# Patient Record
Sex: Female | Born: 1940
Health system: Southern US, Community
[De-identification: ages and names within clinical notes are randomized; demographics above are authoritative.]

## PROBLEM LIST (undated history)

## (undated) DIAGNOSIS — J8489 Other specified interstitial pulmonary diseases: Secondary | ICD-10-CM

## (undated) DIAGNOSIS — K224 Dyskinesia of esophagus: Secondary | ICD-10-CM

## (undated) DIAGNOSIS — J189 Pneumonia, unspecified organism: Secondary | ICD-10-CM

## (undated) DIAGNOSIS — M8580 Other specified disorders of bone density and structure, unspecified site: Secondary | ICD-10-CM

## (undated) DIAGNOSIS — K3184 Gastroparesis: Secondary | ICD-10-CM

## (undated) DIAGNOSIS — G629 Polyneuropathy, unspecified: Secondary | ICD-10-CM

## (undated) DIAGNOSIS — J8410 Pulmonary fibrosis, unspecified: Secondary | ICD-10-CM

## (undated) DIAGNOSIS — D75839 Thrombocytosis, unspecified: Secondary | ICD-10-CM

## (undated) DIAGNOSIS — I7300 Raynaud's syndrome without gangrene: Secondary | ICD-10-CM

## (undated) DIAGNOSIS — T7840XA Allergy, unspecified, initial encounter: Secondary | ICD-10-CM

## (undated) DIAGNOSIS — J069 Acute upper respiratory infection, unspecified: Secondary | ICD-10-CM

## (undated) DIAGNOSIS — D509 Iron deficiency anemia, unspecified: Secondary | ICD-10-CM

## (undated) DIAGNOSIS — K449 Diaphragmatic hernia without obstruction or gangrene: Secondary | ICD-10-CM

## (undated) DIAGNOSIS — H3530 Unspecified macular degeneration: Secondary | ICD-10-CM

## (undated) DIAGNOSIS — K2210 Ulcer of esophagus without bleeding: Secondary | ICD-10-CM

## (undated) DIAGNOSIS — H269 Unspecified cataract: Secondary | ICD-10-CM

## (undated) DIAGNOSIS — M1990 Unspecified osteoarthritis, unspecified site: Secondary | ICD-10-CM

## (undated) DIAGNOSIS — R519 Headache, unspecified: Secondary | ICD-10-CM

## (undated) DIAGNOSIS — K219 Gastro-esophageal reflux disease without esophagitis: Secondary | ICD-10-CM

## (undated) HISTORY — DX: Pneumonia, unspecified organism: J18.9

## (undated) HISTORY — DX: Dyskinesia of esophagus: K22.4

## (undated) HISTORY — PX: BREAST LUMPECTOMY: SHX2

## (undated) HISTORY — PX: CATARACT EXTRACTION: SUR2

## (undated) HISTORY — PX: UPPER GASTROINTESTINAL ENDOSCOPY: SHX188

## (undated) HISTORY — DX: Acute upper respiratory infection, unspecified: J06.9

## (undated) HISTORY — DX: Pulmonary fibrosis, unspecified: J84.10

## (undated) HISTORY — DX: Other specified interstitial pulmonary diseases: J84.89

## (undated) HISTORY — DX: Other specified disorders of bone density and structure, unspecified site: M85.80

## (undated) HISTORY — DX: Gastro-esophageal reflux disease without esophagitis: K21.9

## (undated) HISTORY — DX: Gastroparesis: K31.84

## (undated) HISTORY — DX: Allergy, unspecified, initial encounter: T78.40XA

## (undated) HISTORY — DX: Diaphragmatic hernia without obstruction or gangrene: K44.9

## (undated) HISTORY — DX: Raynaud's syndrome without gangrene: I73.00

## (undated) HISTORY — PX: TOTAL ABDOMINAL HYSTERECTOMY: SHX209

## (undated) HISTORY — PX: LUNG BIOPSY: SHX232

## (undated) HISTORY — DX: Unspecified cataract: H26.9

## (undated) HISTORY — DX: Iron deficiency anemia, unspecified: D50.9

## (undated) HISTORY — DX: Polyneuropathy, unspecified: G62.9

## (undated) HISTORY — PX: COLONOSCOPY: SHX174

## (undated) HISTORY — DX: Unspecified osteoarthritis, unspecified site: M19.90

## (undated) HISTORY — DX: Ulcer of esophagus without bleeding: K22.10

## (undated) HISTORY — DX: Thrombocytosis, unspecified: D75.839

## (undated) HISTORY — DX: Unspecified macular degeneration: H35.30

---

## 2000-10-10 ENCOUNTER — Other Ambulatory Visit: Admission: RE | Admit: 2000-10-10 | Discharge: 2000-10-10 | Payer: Self-pay | Admitting: Gynecology

## 2001-11-15 DIAGNOSIS — J8489 Other specified interstitial pulmonary diseases: Secondary | ICD-10-CM

## 2001-11-15 HISTORY — DX: Other specified interstitial pulmonary diseases: J84.89

## 2002-03-29 ENCOUNTER — Encounter: Payer: Self-pay | Admitting: Critical Care Medicine

## 2002-03-29 ENCOUNTER — Ambulatory Visit: Admission: RE | Admit: 2002-03-29 | Discharge: 2002-03-29 | Payer: Self-pay | Admitting: Critical Care Medicine

## 2002-03-29 ENCOUNTER — Encounter (INDEPENDENT_AMBULATORY_CARE_PROVIDER_SITE_OTHER): Payer: Self-pay | Admitting: Specialist

## 2002-05-08 ENCOUNTER — Encounter: Payer: Self-pay | Admitting: Thoracic Surgery (Cardiothoracic Vascular Surgery)

## 2002-05-10 ENCOUNTER — Inpatient Hospital Stay (HOSPITAL_COMMUNITY)
Admission: RE | Admit: 2002-05-10 | Discharge: 2002-05-14 | Payer: Self-pay | Admitting: Thoracic Surgery (Cardiothoracic Vascular Surgery)

## 2002-05-10 ENCOUNTER — Encounter (INDEPENDENT_AMBULATORY_CARE_PROVIDER_SITE_OTHER): Payer: Self-pay | Admitting: *Deleted

## 2002-05-10 ENCOUNTER — Encounter: Payer: Self-pay | Admitting: Thoracic Surgery (Cardiothoracic Vascular Surgery)

## 2002-05-11 ENCOUNTER — Encounter: Payer: Self-pay | Admitting: Thoracic Surgery (Cardiothoracic Vascular Surgery)

## 2002-05-12 ENCOUNTER — Encounter: Payer: Self-pay | Admitting: Thoracic Surgery (Cardiothoracic Vascular Surgery)

## 2002-05-13 ENCOUNTER — Encounter: Payer: Self-pay | Admitting: Thoracic Surgery (Cardiothoracic Vascular Surgery)

## 2002-05-13 ENCOUNTER — Encounter: Payer: Self-pay | Admitting: Cardiothoracic Surgery

## 2002-05-14 ENCOUNTER — Encounter: Payer: Self-pay | Admitting: Thoracic Surgery (Cardiothoracic Vascular Surgery)

## 2003-01-06 ENCOUNTER — Encounter: Payer: Self-pay | Admitting: Internal Medicine

## 2003-01-08 ENCOUNTER — Ambulatory Visit (HOSPITAL_COMMUNITY): Admission: RE | Admit: 2003-01-08 | Discharge: 2003-01-08 | Payer: Self-pay | Admitting: Internal Medicine

## 2003-01-22 ENCOUNTER — Encounter: Payer: Self-pay | Admitting: Internal Medicine

## 2003-01-22 ENCOUNTER — Ambulatory Visit (HOSPITAL_COMMUNITY): Admission: RE | Admit: 2003-01-22 | Discharge: 2003-01-22 | Payer: Self-pay | Admitting: Internal Medicine

## 2003-11-04 ENCOUNTER — Other Ambulatory Visit: Admission: RE | Admit: 2003-11-04 | Discharge: 2003-11-04 | Payer: Self-pay | Admitting: Gynecology

## 2003-11-16 HISTORY — PX: CHOLECYSTECTOMY: SHX55

## 2004-02-19 ENCOUNTER — Encounter (INDEPENDENT_AMBULATORY_CARE_PROVIDER_SITE_OTHER): Payer: Self-pay | Admitting: Specialist

## 2004-02-19 ENCOUNTER — Observation Stay (HOSPITAL_COMMUNITY): Admission: RE | Admit: 2004-02-19 | Discharge: 2004-02-20 | Payer: Self-pay | Admitting: General Surgery

## 2006-05-26 ENCOUNTER — Ambulatory Visit: Payer: Self-pay | Admitting: Internal Medicine

## 2006-11-29 ENCOUNTER — Other Ambulatory Visit: Admission: RE | Admit: 2006-11-29 | Discharge: 2006-11-29 | Payer: Self-pay | Admitting: Obstetrics and Gynecology

## 2007-08-24 ENCOUNTER — Ambulatory Visit: Payer: Self-pay | Admitting: Internal Medicine

## 2007-11-30 ENCOUNTER — Other Ambulatory Visit: Admission: RE | Admit: 2007-11-30 | Discharge: 2007-11-30 | Payer: Self-pay | Admitting: Obstetrics and Gynecology

## 2008-01-25 ENCOUNTER — Ambulatory Visit: Payer: Self-pay | Admitting: Internal Medicine

## 2008-01-26 DIAGNOSIS — K3184 Gastroparesis: Secondary | ICD-10-CM | POA: Insufficient documentation

## 2008-01-26 DIAGNOSIS — K224 Dyskinesia of esophagus: Secondary | ICD-10-CM | POA: Insufficient documentation

## 2008-01-26 DIAGNOSIS — K219 Gastro-esophageal reflux disease without esophagitis: Secondary | ICD-10-CM | POA: Insufficient documentation

## 2008-02-07 ENCOUNTER — Encounter: Payer: Self-pay | Admitting: Internal Medicine

## 2008-02-07 ENCOUNTER — Ambulatory Visit: Payer: Self-pay | Admitting: Internal Medicine

## 2008-12-04 ENCOUNTER — Other Ambulatory Visit: Admission: RE | Admit: 2008-12-04 | Discharge: 2008-12-04 | Payer: Self-pay | Admitting: Obstetrics and Gynecology

## 2009-06-25 ENCOUNTER — Encounter: Payer: Self-pay | Admitting: Pulmonary Disease

## 2009-10-14 ENCOUNTER — Ambulatory Visit: Payer: Self-pay | Admitting: Pulmonary Disease

## 2009-10-16 ENCOUNTER — Encounter: Payer: Self-pay | Admitting: Pulmonary Disease

## 2009-10-16 ENCOUNTER — Telehealth: Payer: Self-pay | Admitting: Internal Medicine

## 2009-10-17 LAB — CONVERTED CEMR LAB
Eosinophils Relative: 1.6 % (ref 0.0–5.0)
GFR calc non Af Amer: 131.16 mL/min (ref >60)
HCT: 39.7 % (ref 36.0–46.0)
Hemoglobin: 13.3 g/dL (ref 12.0–15.0)
Lymphocytes Relative: 21 % (ref 12.0–46.0)
Lymphs Abs: 1.9 10*3/uL (ref 0.7–4.0)
MCHC: 33.5 g/dL (ref 30.0–36.0)
MCV: 94.1 fL (ref 78.0–100.0)
Monocytes Absolute: 0.8 10*3/uL (ref 0.1–1.0)
Neutro Abs: 6.1 10*3/uL (ref 1.4–7.7)
Potassium: 4.7 meq/L (ref 3.5–5.1)
RBC: 4.22 M/uL (ref 3.87–5.11)
RDW: 13.9 % (ref 11.5–14.6)
Sodium: 141 meq/L (ref 135–145)
WBC: 8.9 10*3/uL (ref 4.5–10.5)

## 2009-10-27 ENCOUNTER — Ambulatory Visit: Payer: Self-pay | Admitting: Internal Medicine

## 2009-10-28 ENCOUNTER — Encounter: Payer: Self-pay | Admitting: Pulmonary Disease

## 2009-10-28 ENCOUNTER — Ambulatory Visit (HOSPITAL_COMMUNITY): Admission: RE | Admit: 2009-10-28 | Discharge: 2009-10-28 | Payer: Self-pay | Admitting: Pulmonary Disease

## 2009-12-03 ENCOUNTER — Ambulatory Visit: Payer: Self-pay | Admitting: Pulmonary Disease

## 2009-12-31 ENCOUNTER — Encounter: Payer: Self-pay | Admitting: Pulmonary Disease

## 2009-12-31 ENCOUNTER — Ambulatory Visit: Payer: Self-pay | Admitting: Pulmonary Disease

## 2009-12-31 DIAGNOSIS — J310 Chronic rhinitis: Secondary | ICD-10-CM | POA: Insufficient documentation

## 2010-02-11 ENCOUNTER — Telehealth (INDEPENDENT_AMBULATORY_CARE_PROVIDER_SITE_OTHER): Payer: Self-pay | Admitting: *Deleted

## 2010-02-13 ENCOUNTER — Encounter: Payer: Self-pay | Admitting: Pulmonary Disease

## 2010-02-17 ENCOUNTER — Encounter: Payer: Self-pay | Admitting: Pulmonary Disease

## 2010-03-31 ENCOUNTER — Ambulatory Visit: Payer: Self-pay | Admitting: Pulmonary Disease

## 2010-04-14 ENCOUNTER — Telehealth: Payer: Self-pay | Admitting: Pulmonary Disease

## 2010-04-16 ENCOUNTER — Telehealth (INDEPENDENT_AMBULATORY_CARE_PROVIDER_SITE_OTHER): Payer: Self-pay | Admitting: *Deleted

## 2010-04-20 ENCOUNTER — Encounter: Payer: Self-pay | Admitting: Pulmonary Disease

## 2010-08-03 ENCOUNTER — Ambulatory Visit: Payer: Self-pay | Admitting: Pulmonary Disease

## 2010-08-14 ENCOUNTER — Ambulatory Visit: Payer: Self-pay | Admitting: Pulmonary Disease

## 2010-08-17 ENCOUNTER — Ambulatory Visit: Payer: Self-pay | Admitting: Internal Medicine

## 2010-08-20 LAB — CONVERTED CEMR LAB
BUN: 19 mg/dL (ref 6–23)
CO2: 32 meq/L (ref 19–32)
Chloride: 100 meq/L (ref 96–112)
Potassium: 5.1 meq/L (ref 3.5–5.1)
Sodium: 139 meq/L (ref 135–145)

## 2010-09-15 ENCOUNTER — Ambulatory Visit: Payer: Self-pay | Admitting: Pulmonary Disease

## 2010-11-18 ENCOUNTER — Ambulatory Visit
Admission: RE | Admit: 2010-11-18 | Discharge: 2010-11-18 | Payer: Self-pay | Source: Home / Self Care | Attending: Internal Medicine | Admitting: Internal Medicine

## 2010-11-18 DIAGNOSIS — R05 Cough: Secondary | ICD-10-CM | POA: Insufficient documentation

## 2010-11-18 DIAGNOSIS — R059 Cough, unspecified: Secondary | ICD-10-CM | POA: Insufficient documentation

## 2010-11-20 ENCOUNTER — Ambulatory Visit: Admit: 2010-11-20 | Payer: Self-pay | Admitting: Pulmonary Disease

## 2010-12-15 DIAGNOSIS — J479 Bronchiectasis, uncomplicated: Secondary | ICD-10-CM | POA: Insufficient documentation

## 2010-12-15 NOTE — Assessment & Plan Note (Signed)
Summary: discuss CT results//jrc   Visit Type:  Follow-up Copy to:  [name] Primary Provider/Referring Provider:  Merlene Laughter, MD   CC:  Patient is here to discuss CT chest results...she is not suing any of the inhaled medications due to dizziness...flutter valve helps get the mucus out of her chest and she brought sputum sample with her today.[name]  History of Present Illness: 70 yo female with hx of BOOP, bronchiolitis, recurrent aspiration and emphysema.  Her breathing has been about the same.  She has chronic cough with clear to yellow sputum.  She denies chest pain, fever, or hemoptysis.  She does not get too much wheezing.  She is bringing up the phlegm okay.  She has been getting some sinus congestion.  She has been using allegra more often.  She felt dizzy when using symbicort.  She did not give her sputum specimen yet.   CT of Chest  Procedure date:  08/17/2010  Findings:      CT CHEST WITH CONTRAST   Technique:  Multidetector CT imaging of the chest was performed following the standard protocol during bolus administration of intravenous contrast.   Contrast: 70 ml Omnipaque-300   Comparison: Chest x-ray 08/03/2010   Findings: No pathologically enlarged mediastinal, hilar or axillary lymph nodes.  Heart size normal.  No pericardial or pleural effusion.   Mild biapical pleural parenchymal scarring.  There is basilar predominant peribronchovascular nodularity and mild bronchiectasis. Findings are worst in the right middle lobe and lingula, where there is also mucoid impaction.  No dominant pulmonary nodule.  No pleural fluid.  Airway is otherwise unremarkable.   Incidental imaging of the upper abdomen shows no acute findings. No worrisome lytic or sclerotic lesions.   IMPRESSION: Pulmonary parenchymal findings of peribronchovascular nodularity, mild bronchiectasis and mucoid impaction   Current Medications (verified): 1)  Singulair 10 Mg Tabs (Montelukast  Sodium) .... One By Mouth At Bedtime 2)  Nexium 40 Mg Cpdr (Esomeprazole Magnesium) .... Take 1 Tablet By Mouth Two Times A Day 3)  Reglan 5 Mg Tabs (Metoclopramide Hcl) .... Take 1 Tablet By Mouth Three Times A Day Before Meals 4)  Premarin 0.45 Mg Tabs (Estrogens Conjugated) .... One Tablet By Mouth Once Daily 5)  Fish Oil 1000 Mg Caps (Omega-3 Fatty Acids) .... 1500mg  Once Daily 6)  Vitamin D 400 Unit Caps (Cholecalciferol) .... Take 1 Tablet By Mouth Once A Day 7)  Viactiv Multi-Vitamin  Chew (Multiple Vitamins-Calcium) .... Take 1 Tablet By Mouth Four Times A Day 8)  Multivitamins  Tabs (Multiple Vitamin) .[name].. 1 By Mouth Daily 9)  Allegra Otc 60mg  .... 1 By Mouth Daily 10)  Mucinex 600 Mg Xr12h-Tab (Guaifenesin) .... One By Mouth Two Times A Day As Needed 11)  Flutter Valve .... Use Two Times A Day As Needed  Allergies (verified): 1)  ! Sulfa 2)  ! Cortisone  Past History:  Past Medical History: Severe GERD BOOP with acute on chronic bronchiolitis from lung biopsy 2003      - also concern for aspiration as cause      - PFT 12/31/09 FEV1 1.84(84%), FEV1% 73, TLC 4.94(98%), DLCO 77%, +BD Bronchiectasis      - CT chest 08/17/10 Raynaud's phenomenon Pneumonia  Past Surgical History: Reviewed history from 10/14/2009 and no changes required. Right VATS lung biopsy 2003 Cholecystectomy 2005 Lumpectomy-left breast Total Abdominal Hysterectomy  Vital Signs:  Patient profile:   70 year old female Height:      65 inches (165.10 cm)  Weight:      104.13 pounds (47.33 kg) BMI:     17.39 O2 Sat:      95 % on Room air Temp:     98.0 degrees F (36.67 degrees C) oral Pulse rate:   84 / minute BP sitting:   108 / 66  (left arm) Cuff size:   regular  Vitals Entered By: Michel Bickers CMA (September 15, 2010 11:49 AM)  O2 Sat at Rest %:  95 O2 Flow:  Room air CC: Patient is here to discuss CT chest results...she is not suing any of the inhaled medications due to dizziness...flutter  valve helps get the mucus out of her chest and she brought sputum sample with her today. Comments Medications reviewed with patient Michel Bickers CMA  September 15, 2010 11:58 AM   Physical Exam  General:   thin.   Nose:  clear nasal discharge.   Mouth:  no oral lesion, MP 2, 2+ tonsills, no exudate Neck:  no JVD, no thyromegaly Lungs:  faint basilar rales, diminished breath sounds, faint wheezing with cough Heart:  regular rate and rhythm, S1, S2 without murmurs, rubs, gallops, or clicks Extremities:  no clubbing, cyanosis, edema, or deformity noted Neurologic:  normal CN II-XII and strength normal.   Cervical Nodes:  no significant adenopathy Psych:  alert and cooperative; normal mood and affect; normal attention span and concentration   Impression & Recommendations:  Problem # 1:  BRONCHIOLITIS (ICD-466.19) Will change her from symbicort to flovent.  She can continue as needed albuterol.  Will continue singulair.  Will arrange for sputum sampling.  She is to continue flutter valve and mucinex.  I don't think she needs percussion vest at this time.  Problem # 2:  RHINITIS (ICD-472.0) She is to continue singulair and as needed allegra.  Medications Added to Medication List This Visit: 1)  Flovent Hfa 110 Mcg/act Aero (Fluticasone propionate  hfa) .... Two puffs two times a day 2)  Allegra Allergy 60 Mg Tabs (Fexofenadine hcl) .... One once daily as needed  Complete Medication List: 1)  Flovent Hfa 110 Mcg/act Aero (Fluticasone propionate  hfa) .... Two puffs two times a day 2)  Singulair 10 Mg Tabs (Montelukast sodium) .... One by mouth at bedtime 3)  Allegra Allergy 60 Mg Tabs (Fexofenadine hcl) .... One once daily as needed 4)  Nexium 40 Mg Cpdr (Esomeprazole magnesium) .... Take 1 tablet by mouth two times a day 5)  Reglan 5 Mg Tabs (Metoclopramide hcl) .... Take 1 tablet by mouth three times a day before meals 6)  Premarin 0.45 Mg Tabs (Estrogens conjugated) .... One tablet by  mouth once daily 7)  Fish Oil 1000 Mg Caps (Omega-3 fatty acids) .... 1500mg  once daily 8)  Vitamin D 400 Unit Caps (Cholecalciferol) .... Take 1 tablet by mouth once a day 9)  Viactiv Multi-vitamin Chew (Multiple vitamins-calcium) .... Take 1 tablet by mouth four times a day 10)  Multivitamins Tabs (Multiple vitamin) .[name].. 1 by mouth daily 11)  Mucinex 600 Mg Xr12h-tab (Guaifenesin) .... One by mouth two times a day as needed 12)  Flutter Valve  .... Use two times a day as needed  Other Orders: Est. Patient Level III (54098) T-Culture, Sputum & Gram Stain (87070/87205-70030) T-Culture & Smear AFB (87206/87116-70280) T-Culture & Smear Fungus (87206/87102-70320)  Patient Instructions: 1)  Will get sample of your phlegm 2)  Flovent two puffs two times a day, and rinse mouth after using 3)  follow  up in 4 months Prescriptions: FLOVENT HFA 110 MCG/ACT AERO (FLUTICASONE PROPIONATE  HFA) two puffs two times a day  #1 x 6   Entered and Authorized by:   [name] MD   Signed by:   [name] MD on 09/15/2010   Method used:   Electronically to        [facility]* (retail)       [address]       [place], [place]  [id number]       Ph: [phone]       Fax: [phone]   RxID:   [id number]

## 2010-12-15 NOTE — Assessment & Plan Note (Signed)
Summary: rov 4 wks ///kp   Copy to:  [name] Primary Provider/Referring Provider:  Merlene Laughter, MD   CC:  Pulmonary fibrosis/emphysema.  No problems today.[name]  History of Present Illness: 70 yo female with hx of BOOP, bronchiolitis, recurrent aspiration and emphysema.  She has not used her proair.  She remembers using this before, and it didn't work then.  She has not had her PFT's yet.  She was seen by Dr. Juanda Chance, and was advised to continue with reglan.  She feels this helps her bloating, but she is still gassy.  She had her swallow evaluation, and was told there was some problems with her swallowing.  She was instructed on techniques to improve her swallowing.  Her breathing otherwise is about the same.  She still has a cough with thick, clear to yellow sputum.  She still gets occasional episodes of wheezing.  She has been noticing an occasional flutter in her chest, and sometimes feels dizzy.   Current Medications (verified): 1)  Nexium 40 Mg Cpdr (Esomeprazole Magnesium) .... Take 1 Tablet By Mouth Two Times A Day 2)  Premarin 0.45 Mg Tabs (Estrogens Conjugated) .... One Tablet By Mouth Once Daily 3)  Fish Oil 1000 Mg Caps (Omega-3 Fatty Acids) .... 1500mg  Once Daily 4)  Vitamin D 400 Unit Caps (Cholecalciferol) .... Take 1 Tablet By Mouth Once A Day 5)  Viactiv Multi-Vitamin  Chew (Multiple Vitamins-Calcium) .... Take 1 Tablet By Mouth Four Times A Day 6)  Reglan 5 Mg Tabs (Metoclopramide Hcl) .... Take 1 Tablet By Mouth Three Times A Day Before Meals 7)  Multivitamins  Tabs (Multiple Vitamin) .[name].. 1 By Mouth Daily  Allergies (verified): 1)  ! Sulfa 2)  ! Cortisone  Past History:  Past Medical History: Reviewed history from 10/14/2009 and no changes required. Severe GERD BOOP with acute on chronic bronchiolitis from lung biopsy 2003      - also concern for aspiration as cause Raynaud's phenomenon Pneumonia  Past Surgical History: Reviewed history from 10/14/2009  and no changes required. Right VATS lung biopsy 2003 Cholecystectomy 2005 Lumpectomy-left breast Total Abdominal Hysterectomy  Vital Signs:  Patient profile:   70 year old female Height:      65 inches Weight:      95.25 pounds O2 Sat:      97 % on Room air Temp:     97.6 degrees F oral Pulse rate:   73 / minute BP sitting:   114 / 72  (left arm) Cuff size:   regular  Vitals Entered By: Michel Bickers CMA (December 03, 2009 1:35 PM)  O2 Sat at Rest %:  97 O2 Flow:  Room air  Physical Exam  General:  cachectic.   Nose:  no deformity, discharge, inflammation, or lesions Mouth:  no oral lesion, MP 2, 2+ tonsills, no exudate Neck:  no JVD, no thyromegaly Lungs:  faint basilar rales, diminished breath sounds, no wheezing Heart:  regular rate and rhythm, S1, S2 without murmurs, rubs, gallops, or clicks Abdomen:  soft, thin, mild epigastric tenderness, no masses, normal bowel sounds Extremities:  no clubbing, cyanosis, edema, or deformity noted Cervical Nodes:  no significant adenopathy   Impression & Recommendations:  Problem # 1:  PULMONARY FIBROSIS (ICD-515) Likely from recurrent aspiration.  She has been evaluated by speech therapy and GI.  Depending on the results of her PFT wil decide if she needs to have repeat CT chest to further assess.  Problem # 2:  BRONCHIOLITIS (ZOX-096.04)  Will assess this further be rescheduling her PFT's.  Problem # 3:  EMPHYSEMA (ICD-492.8) Will try her on symbicort.  Medications Added to Medication List This Visit: 1)  Multivitamins Tabs (Multiple vitamin) .[name].. 1 by mouth daily 2)  Symbicort 160-4.5 Mcg/act Aero (Budesonide-formoterol fumarate) .... Two puffs two times a day  Complete Medication List: 1)  Nexium 40 Mg Cpdr (Esomeprazole magnesium) .... Take 1 tablet by mouth two times a day 2)  Premarin 0.45 Mg Tabs (Estrogens conjugated) .... One tablet by mouth once daily 3)  Fish Oil 1000 Mg Caps (Omega-3 fatty acids) .... 1500mg  once  daily 4)  Vitamin D 400 Unit Caps (Cholecalciferol) .... Take 1 tablet by mouth once a day 5)  Viactiv Multi-vitamin Chew (Multiple vitamins-calcium) .... Take 1 tablet by mouth four times a day 6)  Reglan 5 Mg Tabs (Metoclopramide hcl) .... Take 1 tablet by mouth three times a day before meals 7)  Multivitamins Tabs (Multiple vitamin) .[name].. 1 by mouth daily 8)  Symbicort 160-4.5 Mcg/act Aero (Budesonide-formoterol fumarate) .... Two puffs two times a day  Other Orders: Est. Patient Level III ([address]) Full Pulmonary Function Test (PFT)  Patient Instructions: 1)  Symbicort two puffs two times a day, and rinse your mouth after using 2)  Will schedule breathing test (PFT) 3)  Follow up in 3 to 4 weeks Prescriptions: SYMBICORT 160-4.5 MCG/ACT AERO (BUDESONIDE-FORMOTEROL FUMARATE) two puffs two times a day  #1 x 3   Entered and Authorized by:   [name] MD   Signed by:   [name] MD on 12/03/2009   Method used:   Electronically to        [facility]* (retail)       [address]       [place], [place]  [address]       Ph: [phone]       Fax: [phone]   RxID:   [id number]    Immunization History:  Influenza Immunization History:    Influenza:  historical (10/29/2009)  Pneumovax Immunization History:    Pneumovax:  historical (10/29/2004)

## 2010-12-15 NOTE — Assessment & Plan Note (Signed)
Summary: ROV AFTER PFT/apc   Copy to:  [name] Primary Provider/Referring Provider:  Merlene Laughter, MD   CC:  Follow up with PFTs.  History of Present Illness: 70 yo female with hx of BOOP, bronchiolitis, recurrent aspiration and emphysema.  She feels that symbicort has helped.  She is not getting as much heaviness in her chest, and feels that her breathing is easier.  She still has cough with clear to yellow sputum.  She denies hemoptysis, fever, rashes.  She is getting some wheeze, and has sinus congestion with post-nasal drip.  She was on anti-histamines before, and these seemed to help.  Current Medications (verified): 1)  Nexium 40 Mg Cpdr (Esomeprazole Magnesium) .... Take 1 Tablet By Mouth Two Times A Day 2)  Premarin 0.45 Mg Tabs (Estrogens Conjugated) .... One Tablet By Mouth Once Daily 3)  Fish Oil 1000 Mg Caps (Omega-3 Fatty Acids) .... 1500mg  Once Daily 4)  Vitamin D 400 Unit Caps (Cholecalciferol) .... Take 1 Tablet By Mouth Once A Day 5)  Viactiv Multi-Vitamin  Chew (Multiple Vitamins-Calcium) .... Take 1 Tablet By Mouth Four Times A Day 6)  Reglan 5 Mg Tabs (Metoclopramide Hcl) .... Take 1 Tablet By Mouth Three Times A Day Before Meals 7)  Multivitamins  Tabs (Multiple Vitamin) .[name].. 1 By Mouth Daily 8)  Symbicort 160-4.5 Mcg/act Aero (Budesonide-Formoterol Fumarate) .... Two Puffs Two Times A Day  Allergies (verified): 1)  ! Sulfa 2)  ! Cortisone  Past History:  Family History: Last updated: 10/27/2009 CHF-Father Family History Lung Cancer-Uncle Lymphoma-Mother Family History of Heart Disease: Grandfather, Father No FH of Colon Cancer:  Social History: Last updated: 10/14/2009 Marital Status: widowed, lives alone Children: yes Occupation: retired Runner, broadcasting/film/video Travel: [place] Patient states former smoker.    Past Medical History: Severe GERD BOOP with acute on chronic bronchiolitis from lung biopsy 2003      - also concern for aspiration as cause      -  PFT 12/31/09 FEV1 1.84(84%), FVC 2.51(82%), FEV1% 73, TLC 4.94(98%), DLCO 77%, +BD Raynaud's phenomenon Pneumonia  Past Surgical History: Reviewed history from 10/14/2009 and no changes required. Right VATS lung biopsy 2003 Cholecystectomy 2005 Lumpectomy-left breast Total Abdominal Hysterectomy  Vital Signs:  Patient profile:   70 year old female Height:      65 inches Weight:      96 pounds BMI:     16.03 O2 Sat:      99 % on Room air Temp:     97.7 degrees F oral Pulse rate:   71 / minute BP sitting:   114 / 76  (left arm)  Vitals Entered By: Renold Genta RCP, LPN (December 31, 2009 2:00 PM)  O2 Sat at Rest %:  99% O2 Flow:  Room air CC: Follow up with PFTs Is Patient Diabetic? No Comments Medications reviewed with patient Renold Genta RCP, LPN  December 31, 2009 2:03 PM    Physical Exam  General:  cachectic.   Nose:  clear nasal discharge.   Mouth:  no oral lesion, MP 2, 2+ tonsills, no exudate Neck:  no JVD, no thyromegaly Lungs:  faint basilar rales, diminished breath sounds, faint wheezing with cough Heart:  regular rate and rhythm, S1, S2 without murmurs, rubs, gallops, or clicks Extremities:  no clubbing, cyanosis, edema, or deformity noted Cervical Nodes:  no significant adenopathy   Impression & Recommendations:  Problem # 1:  BRONCHIOLITIS (ZOX-096.04) She has mild airflow obstruction with bronchodilator responsiveness on PFT.  She  has improvement with inhaler therapy.  I will continue her on symbicort.  I will start as needed ventolin, and add singulair.    Problem # 2:  EMPHYSEMA (ICD-492.8) As above.  Problem # 3:  PULMONARY FIBROSIS (ICD-515) She has chronic changes on her recent chest xray, and her pulmonary function test lung volumes and diffusion were reasonably good.  I don't think she needs further imaging studies for this at present.  Problem # 4:  RHINITIS (ICD-472.0) Advised her to try using nasal irrigation. Also advised to use  claritin on a regular basis for the next several days, then as needed after this.  Depending on her response will decide if she may need further allergy testing.  Medications Added to Medication List This Visit: 1)  Singulair 10 Mg Tabs (Montelukast sodium) .... One by mouth at bedtime 2)  Claritin 10 Mg Tabs (Loratadine) .... One by mouth once daily for 5 days, then as needed 3)  Ventolin Hfa 108 (90 Base) Mcg/act Aers (Albuterol sulfate) .... Two puffs up to four times per day as needed  Complete Medication List: 1)  Nexium 40 Mg Cpdr (Esomeprazole magnesium) .... Take 1 tablet by mouth two times a day 2)  Premarin 0.45 Mg Tabs (Estrogens conjugated) .... One tablet by mouth once daily 3)  Fish Oil 1000 Mg Caps (Omega-3 fatty acids) .... 1500mg  once daily 4)  Vitamin D 400 Unit Caps (Cholecalciferol) .... Take 1 tablet by mouth once a day 5)  Viactiv Multi-vitamin Chew (Multiple vitamins-calcium) .... Take 1 tablet by mouth four times a day 6)  Reglan 5 Mg Tabs (Metoclopramide hcl) .... Take 1 tablet by mouth three times a day before meals 7)  Multivitamins Tabs (Multiple vitamin) .[name].. 1 by mouth daily 8)  Symbicort 160-4.5 Mcg/act Aero (Budesonide-formoterol fumarate) .... Two puffs two times a day 9)  Singulair 10 Mg Tabs (Montelukast sodium) .... One by mouth at bedtime 10)  Claritin 10 Mg Tabs (Loratadine) .... One by mouth once daily for 5 days, then as needed 11)  Ventolin Hfa 108 (90 Base) Mcg/act Aers (Albuterol sulfate) .... Two puffs up to four times per day as needed  Other Orders: Est. Patient Level III (04540)  Patient Instructions: 1)  Symbicort two puffs two times a day 2)  Ventolin two puffs up to four times per day as needed for cough, wheeze, congestion, or shortness of breath 3)  Nasal irrigation once daily for sinuses 4)  Singulair 10 mg at bedtime 5)  Claritin 10 mg once daily for 5 days, then as needed for nasal congestion and allergies 6)  Follow up in 3  months Prescriptions: VENTOLIN HFA 108 (90 BASE) MCG/ACT AERS (ALBUTEROL SULFATE) two puffs up to four times per day as needed  #1 x 6   Entered and Authorized by:   [name] MD   Signed by:   [name] MD on 12/31/2009   Method used:   Electronically to        [facility]* (retail)       [address]       [place], [place]  [address]       Ph: [phone]       Fax: [phone]   RxID:   [id number] SINGULAIR 10 MG TABS (MONTELUKAST SODIUM) one by mouth at bedtime  #30 x 4   Entered and Authorized by:   [name] MD   Signed by:   [name] MD on 12/31/2009  Method used:   Electronically to        [facility]* (retail)       [address]       [place], [place]  [id number]       Ph: [phone]       Fax: [phone]   RxID:   [id number]

## 2010-12-15 NOTE — Miscellaneous (Signed)
Summary: Orders Update pft charges  Clinical Lists Changes  Orders: Added new Service order of Carbon Monoxide diffusing w/capacity (94720) - Signed Added new Service order of Lung Volumes (94240) - Signed Added new Service order of Spirometry (Pre & Post) (94060) - Signed 

## 2010-12-15 NOTE — Assessment & Plan Note (Signed)
Summary: 3 months/apc   Visit Type:  Follow-up Copy to:  [name] Primary Provider/Referring Provider:  Merlene Laughter, MD   CC:  Bronchiolitis. Emphysema. PF.  The patient c/o cough with grayish mucus...hoarseness...sob with climbing stairs. She has been off Singulair x3-4 weeks and has been taking Allegra OTC.[name]  History of Present Illness: 70 yo female with hx of BOOP, bronchiolitis, recurrent aspiration and emphysema.  She has been more hoarse recently with the change in the weather.  She still has a cough with gray sputum.  She is not having much wheeze.  She denies chest pain, fever, or hemoptysis.  She ran out of her singulair, and noticed her breathing did get worse after this.  She as been been using allegra, but this does not help as much as the singulair.  Current Medications (verified): 1)  Nexium 40 Mg Cpdr (Esomeprazole Magnesium) .... Take 1 Tablet By Mouth Two Times A Day 2)  Premarin 0.45 Mg Tabs (Estrogens Conjugated) .... One Tablet By Mouth Once Daily 3)  Fish Oil 1000 Mg Caps (Omega-3 Fatty Acids) .... 1500mg  Once Daily 4)  Vitamin D 400 Unit Caps (Cholecalciferol) .... Take 1 Tablet By Mouth Once A Day 5)  Viactiv Multi-Vitamin  Chew (Multiple Vitamins-Calcium) .... Take 1 Tablet By Mouth Four Times A Day 6)  Reglan 5 Mg Tabs (Metoclopramide Hcl) .... Take 1 Tablet By Mouth Three Times A Day Before Meals 7)  Multivitamins  Tabs (Multiple Vitamin) .[name].. 1 By Mouth Daily 8)  Symbicort 160-4.5 Mcg/act Aero (Budesonide-Formoterol Fumarate) .... Two Puffs Two Times A Day 9)  Allegra Otc 60mg  .... 1 By Mouth Daily 10)  Ventolin Hfa 108 (90 Base) Mcg/act Aers (Albuterol Sulfate) .... Two Puffs Up To Four Times Per Day As Needed  Allergies (verified): 1)  ! Sulfa 2)  ! Cortisone  Past History:  Past Medical History: Reviewed history from 12/31/2009 and no changes required. Severe GERD BOOP with acute on chronic bronchiolitis from lung biopsy 2003      - also concern  for aspiration as cause      - PFT 12/31/09 FEV1 1.84(84%), FVC 2.51(82%), FEV1% 73, TLC 4.94(98%), DLCO 77%, +BD Raynaud's phenomenon Pneumonia  Past Surgical History: Reviewed history from 10/14/2009 and no changes required. Right VATS lung biopsy 2003 Cholecystectomy 2005 Lumpectomy-left breast Total Abdominal Hysterectomy  Vital Signs:  Patient profile:   71 year old female Height:      65 inches (165.10 cm) Weight:      96.31 pounds (43.78 kg) BMI:     16.08 O2 Sat:      94 % on Room air Temp:     97.6 degrees F (36.44 degrees C) oral Pulse rate:   77 / minute BP sitting:   102 / 68  (right arm) Cuff size:   regular  Vitals Entered By: Michel Bickers CMA (Mar 31, 2010 9:35 AM)  O2 Sat at Rest %:  94 O2 Flow:  Room air CC: Bronchiolitis. Emphysema. PF.  The patient c/o cough with grayish mucus...hoarseness...sob with climbing stairs. She has been off Singulair x3-4 weeks and has been taking Allegra OTC. Comments Medications reviewed. Michel Bickers CMA  Mar 31, 2010 9:37 AM   Physical Exam  General:   thin.   Nose:  clear nasal discharge.   Mouth:  no oral lesion, MP 2, 2+ tonsills, no exudate Neck:  no JVD, no thyromegaly Lungs:  faint basilar rales, diminished breath sounds, faint wheezing with cough Heart:  regular  rate and rhythm, S1, S2 without murmurs, rubs, gallops, or clicks Extremities:  no clubbing, cyanosis, edema, or deformity noted Cervical Nodes:  no significant adenopathy   Impression & Recommendations:  Problem # 1:  BRONCHIOLITIS (ICD-466.19) I will resume her singulair.  She is to continue symbicort.  I don't think she needs prednisone or antibiotics at this time.  I don't think she needs additional imaging studies at this time  Problem # 2:  PULMONARY FIBROSIS (ICD-515)  She has chronic changes on her recent chest xray, and her pulmonary function test lung volumes and diffusion were reasonably good.  I don't think she needs further imaging studies  for this at present.  Problem # 3:  RHINITIS (ICD-472.0) I think most of her symptoms are related to her sinuses at present.  Will resume her singulair.  Problem # 4:  GERD (ICD-530.81) Have refilled her nexium.  Advised her to follow up with primary care and GI to determine if she needs to continue this.  Medications Added to Medication List This Visit: 1)  Singulair 10 Mg Tabs (Montelukast sodium) .... One by mouth at bedtime 2)  Allegra Otc 60mg   .... 1 by mouth daily  Complete Medication List: 1)  Symbicort 160-4.5 Mcg/act Aero (Budesonide-formoterol fumarate) .... Two puffs two times a day 2)  Singulair 10 Mg Tabs (Montelukast sodium) .... One by mouth at bedtime 3)  Ventolin Hfa 108 (90 Base) Mcg/act Aers (Albuterol sulfate) .... Two puffs up to four times per day as needed 4)  Nexium 40 Mg Cpdr (Esomeprazole magnesium) .... Take 1 tablet by mouth two times a day 5)  Reglan 5 Mg Tabs (Metoclopramide hcl) .... Take 1 tablet by mouth three times a day before meals 6)  Premarin 0.45 Mg Tabs (Estrogens conjugated) .... One tablet by mouth once daily 7)  Fish Oil 1000 Mg Caps (Omega-3 fatty acids) .... 1500mg  once daily 8)  Vitamin D 400 Unit Caps (Cholecalciferol) .... Take 1 tablet by mouth once a day 9)  Viactiv Multi-vitamin Chew (Multiple vitamins-calcium) .... Take 1 tablet by mouth four times a day 10)  Multivitamins Tabs (Multiple vitamin) .[name].. 1 by mouth daily 11)  Allegra Otc 60mg   .... 1 by mouth daily  Other Orders: Est. Patient Level III (16109)  Patient Instructions: 1)  Restart singulair 10 mg at bedtime 2)  Continue symbicort two puffs two times a day  3)  Continue ventolin two puffs up to four times per day as needed  4)  Allegra as needed for allergies 5)  Salt water gargles as needed 6)  Nasal irrigation as needed 7)  Follow up in 3 to 4 months Prescriptions: NEXIUM 40 MG CPDR (ESOMEPRAZOLE MAGNESIUM) Take 1 tablet by mouth two times a day  #90 x 3   Entered  and Authorized by:   [name] MD   Signed by:   [name] MD on 03/31/2010   Method used:   Electronically to        [facility]* (mail-order)             ,          Ph: [phone]       Fax: [phone]   RxID:   [id number] NEXIUM 40 MG CPDR (ESOMEPRAZOLE MAGNESIUM) Take 1 tablet by mouth two times a day  #90 x 3   Entered and Authorized by:   [name] MD   Signed by:   [name] MD on 03/31/2010  Method used:   Electronically to        [facility]* (retail)       [address]       [place], [place]  [id number]       Ph: [phone]       Fax: [phone]   RxID:   [id number]

## 2010-12-15 NOTE — Medication Information (Signed)
Summary: Nexium Approved/[facility]  Nexium Approved/[facility]   Imported By: Sherian Rein 04/27/2010 10:37:14  _____________________________________________________________________  External Attachment:    Type:   Image     Comment:   External Document

## 2010-12-15 NOTE — Medication Information (Signed)
Summary: Prior [place]ho for Singulair/[facility]  Prior [employer]ho for Singulair/[facility]   Imported By: Sherian Rein 02/18/2010 08:11:12  _____________________________________________________________________  External Attachment:    Type:   Image     Comment:   External Document

## 2010-12-15 NOTE — Medication Information (Signed)
Summary: Singulair Approved/[facility]  RX Folder   Imported By: Valinda Hoar 02/17/2010 08:25:29  _____________________________________________________________________  External Attachment:    Type:   Image     Comment:   External Document

## 2010-12-15 NOTE — Progress Notes (Signed)
Summary: Prior Auth - singular approved  Phone Note Call from Patient Call back at [phone]   Caller: Patient Call For: sood Summary of Call: insurance requesting alternative for singulair [place] Initial call taken by: Rickard Patience,  February 11, 2010 10:50 AM  Follow-up for Phone Call        there is a prior auth request from [facility] in the prior [employer].  Aundra Millet Reynolds LPN  February 11, 2010 11:55 AM   Additional Follow-up for Phone Call Additional follow up Details #1::        Called and initiated PA for singulair 10 mg once daily.  Will await form to be faxed from [facility], case ID number is [id number].  Will forward to PA box until form received. Additional Follow-up by: Vernie Murders,  February 11, 2010 1:57 PM    Additional Follow-up for Phone Call Additional follow up Details #2::    PA questionaire was placed in Dr Evlyn Courier folder.   Vernie Murders  February 11, 2010 2:35 PM  Received Approval letter for singular from 01/23/2010 until 02/13/2011.  Had letter scanned into EMR.  [address] [place], spoke with Di Giorgio.  Irving Burton informed singular has been approved from 01/23/2010 until 02/13/3011. She verbalized understanding.   Follow-up by: Gweneth Dimitri RN,  February 16, 2010 11:23 AM

## 2010-12-15 NOTE — Progress Notes (Signed)
Summary: nexium  Phone Note Call from Patient Call back at Home Phone [phone]   Caller: Patient Call For: Cresta Riden Summary of Call: pt states that her rx for nexium has been sent to [facility] (per her call to [facility]). call pt at home or cell [phone] Initial call taken by: Tivis Ringer, CNA,  Apr 14, 2010 10:44 AM  Follow-up for Phone Call        pt states she spoke with [facility] and they never received refill for nexium, so I called and they doid not have it on file so I called in refill. I changed the quanity because pt is to take twice daily instead of once daily per ov note. Pt aware rx called in. Carron Curie CMA  Apr 14, 2010 10:53 AM     Prescriptions: NEXIUM 40 MG CPDR (ESOMEPRAZOLE MAGNESIUM) Take 1 tablet by mouth two times a day  #180 x 3   Entered by:   Carron Curie CMA   Authorized by:   [name] MD   Signed by:   Carron Curie CMA on 04/14/2010   Method used:   Telephoned to ...       [facility]* (mail-order)             ,          Ph: [phone]       Fax: [phone]   RxID:   [id number]

## 2010-12-15 NOTE — Progress Notes (Signed)
Summary: PA for nexium-Approved from 03/30/10-04/19/10  Phone Note Other Incoming   Summary of Call: Apparently a PA was intiiated for pt [name] for Nexium. Form was completed by [facility] and faxed to [facility] [phone]. Form placed in triage and will await approval/denial letter.  Initial call taken by: Carron Curie CMA,  April 16, 2010 2:32 PM  Follow-up for Phone Call        Approval Letter from [employer] is in EMR.  Nexium approved from 03/30/10-04/20/11.  Spoke with Windell Moulding with [facility].  Per Windell Moulding, nexium rx was shipped on 04/24/10.   Follow-up by: Gweneth Dimitri RN,  April 27, 2010 4:49 PM

## 2010-12-15 NOTE — Miscellaneous (Signed)
Summary: Pulmonary function test   Pulmonary Function Test Date: 12/31/2009 Height (in.): 65 Gender: Female  Pre-Spirometry FVC    Value: 2.69 L/min   Pred: 3.04 L/min     % Pred: 89 % FEV1    Value: 1.77 L     Pred: 2.20 L     % Pred: 80 % FEV1/FVC  Value: 66 %     Pred: 72 %     % Pred: . % FEF 25-75  Value: 0.96 L/min   Pred: 2.47 L/min     % Pred: 39 %  Post-Spirometry FVC    Value: 2.51 L/min   Pred: 3.04 L/min     % Pred: 82 % FEV1    Value: 1.84 L     Pred: 2.20 L     % Pred: 84 % FEV1/FVC  Value: 73 %     Pred: 72 %     % Pred: . % FEF 25-75  Value: 1.29 L/min   Pred: 2.47 L/min     % Pred: 52 %  Lung Volumes TLC    Value: 4.94 L   % Pred: 98 % RV    Value: 2.24 L   % Pred: 113 % DLCO    Value: 11.6 %   % Pred: 77 % DLCO/VA  Value: 3.86 %   % Pred: 106 %  Comments: Mild obstruction with small airway disease.  Bronchodilator responsiveness based on FEF 25-75.  Normal lung volumes.  Mild diffusion defect corrects for lung volumes.  Clinical Lists Changes  Observations: Added new observation of PFT COMMENTS: Mild obstruction with small airway disease.  Bronchodilator responsiveness based on FEF 25-75.  Normal lung volumes.  Mild diffusion defect corrects for lung volumes. (12/31/2009 14:38) Added new observation of DLCO/VA%EXP: 106 % (12/31/2009 14:38) Added new observation of DLCO/VA: 3.86 % (12/31/2009 14:38) Added new observation of DLCO % EXPEC: 77 % (12/31/2009 14:38) Added new observation of DLCO: 11.6 % (12/31/2009 14:38) Added new observation of RV % EXPECT: 113 % (12/31/2009 14:38) Added new observation of RV: 2.24 L (12/31/2009 14:38) Added new observation of TLC % EXPECT: 98 % (12/31/2009 14:38) Added new observation of TLC: 4.94 L (12/31/2009 14:38) Added new observation of FEF2575%EXPS: 52 % (12/31/2009 14:38) Added new observation of PSTFEF25/75P: 2.47  (12/31/2009 14:38) Added new observation of PSTFEF25/75%: 1.29 L/min (12/31/2009 14:38) Added new  observation of PSTFEV1/FCV%: . % (12/31/2009 14:38) Added new observation of FEV1FVCPRDPS: 72 % (12/31/2009 14:38) Added new observation of PSTFEV1/FVC: 73 % (12/31/2009 14:38) Added new observation of POSTFEV1%PRD: 84 % (12/31/2009 14:38) Added new observation of FEV1PRDPST: 2.20 L (12/31/2009 14:38) Added new observation of POST FEV1: 1.84 L/min (12/31/2009 14:38) Added new observation of POST FVC%EXP: 82 % (12/31/2009 14:38) Added new observation of FVCPRDPST: 3.04 L/min (12/31/2009 14:38) Added new observation of POST FVC: 2.51 L (12/31/2009 14:38) Added new observation of FEF % EXPEC: 39 % (12/31/2009 14:38) Added new observation of FEF25-75%PRE: 2.47 L/min (12/31/2009 14:38) Added new observation of FEF 25-75%: 0.96 L/min (12/31/2009 14:38) Added new observation of FEV1/FVC%EXP: . % (12/31/2009 14:38) Added new observation of FEV1/FVC PRE: 72 % (12/31/2009 14:38) Added new observation of FEV1/FVC: 66 % (12/31/2009 14:38) Added new observation of FEV1 % EXP: 80 % (12/31/2009 14:38) Added new observation of FEV1 PREDICT: 2.20 L (12/31/2009 14:38) Added new observation of FEV1: 1.77 L (12/31/2009 14:38) Added new observation of FVC % EXPECT: 89 % (12/31/2009 14:38) Added new observation of FVC PREDICT: 3.04 L (12/31/2009 14:38)  Added new observation of FVC: 2.69 L (12/31/2009 14:38) Added new observation of PFT HEIGHT: 65  (12/31/2009 14:38) Added new observation of PFT DATE: 12/31/2009  (12/31/2009 14:38)

## 2010-12-15 NOTE — Assessment & Plan Note (Signed)
Summary: 3-4 months/apc   Copy to:  [name] Primary Sylvi Rybolt/Referring Kadijah Shamoon:  Merlene Laughter, MD   CC:  4 month followup.  Pt states that her breathing is the same- no better or worse.  She stats that she had bronchitis back in June and was txed with zithromax but states that her cough never really improved.  Cough is prod with grey sputum.  She still c/o runny nose.[name]  History of Present Illness: 70 yo female with hx of BOOP, bronchiolitis, recurrent aspiration and emphysema.  She was treated for a bronchitis last Summer.  She has not fully recovered with her cough since then.  She is still bringing up gray colored sputum, and gets a rattle in her chest.  She has not had fever or hemoptysis.  She does occasionally get a heaviness in her chest, and this gets better when she uses her inhalers.  She will also sometimes get light-headed when she has problems with her breathing.  She has been getting hoarseness that comes and goes.  She has been using her ventolin 3 to 4 times per week, and this helps.  Current Medications (verified): 1)  Symbicort 160-4.5 Mcg/act Aero (Budesonide-Formoterol Fumarate) .... Two Puffs Two Times A Day 2)  Singulair 10 Mg Tabs (Montelukast Sodium) .... One By Mouth At Bedtime 3)  Ventolin Hfa 108 (90 Base) Mcg/act Aers (Albuterol Sulfate) .... Two Puffs Up To Four Times Per Day As Needed 4)  Nexium 40 Mg Cpdr (Esomeprazole Magnesium) .... Take 1 Tablet By Mouth Two Times A Day 5)  Reglan 5 Mg Tabs (Metoclopramide Hcl) .... Take 1 Tablet By Mouth Three Times A Day Before Meals 6)  Premarin 0.45 Mg Tabs (Estrogens Conjugated) .... One Tablet By Mouth Once Daily 7)  Fish Oil 1000 Mg Caps (Omega-3 Fatty Acids) .... 1500mg  Once Daily 8)  Vitamin D 400 Unit Caps (Cholecalciferol) .... Take 1 Tablet By Mouth Once A Day 9)  Viactiv Multi-Vitamin  Chew (Multiple Vitamins-Calcium) .... Take 1 Tablet By Mouth Four Times A Day 10)  Multivitamins  Tabs (Multiple Vitamin)  .[name].. 1 By Mouth Daily 11)  Allegra Otc 60mg  .... 1 By Mouth Daily  Allergies (verified): 1)  ! Sulfa 2)  ! Cortisone  Past History:  Past Medical History: Severe GERD BOOP with acute on chronic bronchiolitis from lung biopsy 2003      - also concern for aspiration as cause      - PFT 12/31/09 FEV1 1.84(84%), FEV1% 73, TLC 4.94(98%), DLCO 77%, +BD Raynaud's phenomenon Pneumonia  Past Surgical History: Reviewed history from 10/14/2009 and no changes required. Right VATS lung biopsy 2003 Cholecystectomy 2005 Lumpectomy-left breast Total Abdominal Hysterectomy  Review of Systems       The patient complains of shortness of breath with activity, productive cough, non-productive cough, chest pain, acid heartburn, and sore throat.  The patient denies shortness of breath at rest, coughing up blood, abdominal pain, difficulty swallowing, nasal congestion/difficulty breathing through nose, hand/feet swelling, joint stiffness or pain, rash, and fever.    Vital Signs:  Patient profile:   70 year old female Weight:      101.25 pounds O2 Sat:      98 % on Room air Temp:     97.3 degrees F oral Pulse rate:   78 / minute BP sitting:   90 / 60  (left arm)  Vitals Entered By: Vernie Murders (August 03, 2010 2:57 PM)  O2 Flow:  Room air  Physical Exam  General:   thin.   Nose:  clear nasal discharge.   Mouth:  no oral lesion, MP 2, 2+ tonsills, no exudate Neck:  no JVD, no thyromegaly Lungs:  faint basilar rales, diminished breath sounds, faint wheezing with cough Heart:  regular rate and rhythm, S1, S2 without murmurs, rubs, gallops, or clicks Extremities:  no clubbing, cyanosis, edema, or deformity noted Neurologic:  normal CN II-XII and strength normal.   Cervical Nodes:  no significant adenopathy   Impression & Recommendations:  Problem # 1:  BRONCHIOLITIS (ICD-466.19) She has persistent cough and sputum after episode of bronchitis this past Summer.  Will repeat a chest  xray today, and then decide if she needs repeat CT chest and sputum sampling.  She is to continue her inhaler regimen and singulair.  I will also have her use as needed mucinex, and flutter valve.  Problem # 2:  PULMONARY FIBROSIS (ICD-515)  She has chronic changes on her recent chest xray, and her pulmonary function test lung volumes and diffusion were reasonably good.  Will determine if she needs repeat CT chest after review of her CXR.   Problem # 3:  RHINITIS (ICD-472.0) Stable.  She is to continue on her current sinus and allergy regimen.  Problem # 4:  GERD (ICD-530.81)  She is to follow up with primary care and GI.  Medications Added to Medication List This Visit: 1)  Mucinex 600 Mg Xr12h-tab (Guaifenesin) .... One by mouth two times a day as needed 2)  Flutter Valve  .... Use two times a day as needed  Complete Medication List: 1)  Symbicort 160-4.5 Mcg/act Aero (Budesonide-formoterol fumarate) .... Two puffs two times a day 2)  Singulair 10 Mg Tabs (Montelukast sodium) .... One by mouth at bedtime 3)  Ventolin Hfa 108 (90 Base) Mcg/act Aers (Albuterol sulfate) .... Two puffs up to four times per day as needed 4)  Nexium 40 Mg Cpdr (Esomeprazole magnesium) .... Take 1 tablet by mouth two times a day 5)  Reglan 5 Mg Tabs (Metoclopramide hcl) .... Take 1 tablet by mouth three times a day before meals 6)  Premarin 0.45 Mg Tabs (Estrogens conjugated) .... One tablet by mouth once daily 7)  Fish Oil 1000 Mg Caps (Omega-3 fatty acids) .... 1500mg  once daily 8)  Vitamin D 400 Unit Caps (Cholecalciferol) .... Take 1 tablet by mouth once a day 9)  Viactiv Multi-vitamin Chew (Multiple vitamins-calcium) .... Take 1 tablet by mouth four times a day 10)  Multivitamins Tabs (Multiple vitamin) .[name].. 1 by mouth daily 11)  Allegra Otc 60mg   .... 1 by mouth daily 12)  Mucinex 600 Mg Xr12h-tab (Guaifenesin) .... One by mouth two times a day as needed 13)  Flutter Valve  .... Use two times a day as  needed  Other Orders: Est. Patient Level III (99213) T-2 View CXR (71020TC)  Patient Instructions: 1)  Mucinex two times a day as needed 2)  Flutter valve 3)  Chest xray today 4)  Follow up in 4 to 6 months Prescriptions: FLUTTER VALVE use two times a day as needed  #1 x 0   Entered and Authorized by:   [name] MD   Signed by:   [name] MD on 08/03/2010   Method used:   Print then Give to Patient   RxID:   [id number]

## 2010-12-16 ENCOUNTER — Ambulatory Visit: Admit: 2010-12-16 | Payer: Self-pay | Admitting: Pulmonary Disease

## 2010-12-16 ENCOUNTER — Other Ambulatory Visit: Payer: Self-pay | Admitting: Pulmonary Disease

## 2010-12-16 ENCOUNTER — Ambulatory Visit (INDEPENDENT_AMBULATORY_CARE_PROVIDER_SITE_OTHER): Payer: Medicare Other | Admitting: Pulmonary Disease

## 2010-12-16 ENCOUNTER — Ambulatory Visit (INDEPENDENT_AMBULATORY_CARE_PROVIDER_SITE_OTHER)
Admission: RE | Admit: 2010-12-16 | Discharge: 2010-12-16 | Disposition: A | Discharge status: Home / Self Care | Payer: Medicare Other | Source: Ambulatory Visit | Attending: Pulmonary Disease | Admitting: Pulmonary Disease

## 2010-12-16 ENCOUNTER — Encounter: Payer: Self-pay | Admitting: Pulmonary Disease

## 2010-12-16 DIAGNOSIS — R05 Cough: Secondary | ICD-10-CM

## 2010-12-16 DIAGNOSIS — R059 Cough, unspecified: Secondary | ICD-10-CM

## 2010-12-16 DIAGNOSIS — J310 Chronic rhinitis: Secondary | ICD-10-CM

## 2010-12-16 DIAGNOSIS — J479 Bronchiectasis, uncomplicated: Secondary | ICD-10-CM

## 2010-12-17 NOTE — Assessment & Plan Note (Signed)
Summary: Pulmonary/ acute ext ov with hfa 50% p coaching   Copy to:  [name] Primary Provider/Referring Provider:  Merlene Laughter, MD   CC:  Cough x 2 wks- worse x 5 days, prod with thick, and grey mucus.  Had hemoptysis a few nights ago.[name]  History of Present Illness: 70 yowf quit smoking  in the 1970s   with hx of BOOP, bronchiolitis, recurrent aspiration on chronic reglan.   November 18, 2010 ov with cough since around 2000 never completely irradicated with tendency to flare in winter and presents as acute workin for  flare  x 2 wks- worse x 5 days, prod with thick, grey mucus.  Had hemoptysis a few nights ago, trace only. denies overt sinus or hb symptoms, minimal sob over baseline. Pt denies any significant sore throat, dysphagia, itching, sneezing,  nasal congestion or excess secretions,  fever, chills, sweats, unintended wt loss, pleuritic or exertional cp,  change in activity tolerance  orthopnea pnd or leg swelling Pt also denies any obvious fluctuation in symptoms with weather or environmental change or other alleviating or aggravating factors.  Prednisone may have helped in past but wants to avoid it.       Current Medications (verified): 1)  Flovent Hfa 110 Mcg/act Aero (Fluticasone Propionate  Hfa) .... Two Puffs Two Times A Day 2)  Singulair 10 Mg Tabs (Montelukast Sodium) .... One By Mouth At Bedtime 3)  Allegra Allergy 60 Mg Tabs (Fexofenadine Hcl) .... One Once Daily As Needed 4)  Nexium 40 Mg Cpdr (Esomeprazole Magnesium) .... Take 1 Tablet By Mouth Two Times A Day 5)  Reglan 5 Mg Tabs (Metoclopramide Hcl) .... Take 1 Tablet By Mouth Three Times A Day Before Meals 6)  Premarin 0.45 Mg Tabs (Estrogens Conjugated) .... One Tablet By Mouth Once Daily 7)  Fish Oil 1000 Mg Caps (Omega-3 Fatty Acids) .... 1500mg  Once Daily 8)  Vitamin D 400 Unit Caps (Cholecalciferol) .... Take 1 Tablet By Mouth Once A Day 9)  Viactiv Multi-Vitamin  Chew (Multiple Vitamins-Calcium) .... Take 1  Tablet By Mouth Four Times A Day 10)  Multivitamins  Tabs (Multiple Vitamin) .[name].. 1 By Mouth Daily 11)  Mucinex 600 Mg Xr12h-Tab (Guaifenesin) .... One By Mouth Two Times A Day As Needed 12)  Flutter Valve .... Use Two Times A Day As Needed  Allergies (verified): 1)  ! Sulfa 2)  ! Cortisone  Past History:  Past Medical History: Severe GERD BOOP with acute on chronic bronchiolitis from lung biopsy 2003      - also concern for aspiration as cause      - PFT 12/31/09 FEV1 1.84(84%), FEV1% 73, TLC 4.94(98%), DLCO 77%, +BD      - HFA 50% p coaching November 18, 2010  Bronchiectasis      - CT chest 08/17/10 Raynaud's phenomenon Pneumonia  Social History: Marital Status: widowed, lives alone Children: yes Occupation: retired Runner, broadcasting/film/video Travel: [place] Quit smoking in the 1970s  Vital Signs:  Patient profile:   70 year old female Weight:      103.25 pounds O2 Sat:      99 % on Room air Temp:     97.5 degrees F oral Pulse rate:   76 / minute BP sitting:   98 / 70  (left arm)  Vitals Entered By: Vernie Murders (November 18, 2010 11:43 AM)  O2 Flow:  Room air CC: Cough x 2 wks- worse x 5 days, prod with thick, grey mucus.  Had hemoptysis a few nights ago.   Physical Exam  Additional Exam:  amb anxious wf nad wt   104 > 103 November 18, 2010  HEENT mild turbinate edema.  Oropharynx no thrush or excess pnd or cobblestoning.  No JVD or cervical adenopathy. Mild accessory muscle hypertrophy. Trachea midline, nl thryroid. Chest was hyperinflated by percussion with diminished breath sounds and moderate increased exp time without wheeze. Hoover sign positive at mid inspiration. Regular rate and rhythm without murmur gallop or rub or increase P2 or edema.  Abd: no hsm, nl excursion. Ext warm without cyanosis or clubbing.     Impression & Recommendations:  Problem # 1:  COUGH (ICD-786.2)  The most common causes of chronic cough in immunocompetent adults include: upper airway cough syndrome  (UACS), previously referred to as postnasal drip syndrome,  caused by variety of rhinosinus conditions; (2) asthma; (3) GERD; (4) chronic bronchitis from cigarette smoking or other inhaled environmental irritants; (5) nonasthmatic eosinophilic bronchitis; and (6) bronchiectasis. These conditions, singly or in combination, have accounted for up to 94% of the causes of chronic cough in prospective studies.  She has an element of bronchiectasis and even ? MAI but most of her cough on this ov appears to be most c/w  Classic Upper airway cough syndrome, so named because it's frequently impossible to sort out how much is  CR/sinusitis with freq throat clearing (which can be related to primary GERD)   vs  causing  secondary extra esophageal GERD from wide swings in gastric pressure that occur with throat clearing, promoting self use of mint and menthol lozenges that reduce the lower esophageal sphincter tone and exacerbate the problem further These are the same pts who not infrequently have failed to tolerate ace inhibitors,  dry powder inhalers or biphosphonates or report having reflux symptoms that don't respond to standard doses of PPI  Try max rx of gerd x 4 weeks and change flovent to qvar 40 to see what difference this makes. I spent extra time with the patient today explaining optimal mdi  technique.  This improved from  25-50% but not better despite numerous attempts  The standardized cough guidelines recently published in Chest are a 14 step process, not a single office visit,  and are intended  to address this problem logically,  with an alogrithm dependent on response to each progressive step  to determine a specific diagnosis with  minimal addtional testing needed. Therefore if compliance is an issue this empiric standardized approach simply won't work.   Orders: Est. Patient Level IV ([address]109)  Problem # 2:  GERD (ICD-530.81) Explained natural h/o uri and why it's necessary in patients at risk to rx  short term with PPI to reduce risk of evolving cyclical cough triggered by epithelial injury and a heightened sensitivty to the effects of any upper airway irritants,  most importantly acid - related    Medications Added to Medication List This Visit: 1)  Flovent Hfa 110 Mcg/act Aero (Fluticasone propionate  hfa) .... Try substituting qvar 40 2 every 12 hours 2)  Tessalon Perles 100 Mg Caps (Benzonatate) .[name].. 1 three times a day as needed 3)  Zithromax Z-pak 250 Mg Tabs (Azithromycin) .... Use as directed 4)  Prednisone 10 Mg Tabs (Prednisone) .... 4 each am x 2days, 2x2days, 1x2days and stop  Other Orders: HFA Instruction [phone])  Patient Instructions: 1)  GERD (REFLUX)  is a common cause of respiratory symptoms. It commonly presents without heartburn and can be  treated with medication, but also with lifestyle changes including avoidance of late meals, excessive alcohol, smoking cessation, and avoid fatty foods, chocolate, peppermint, colas, red wine, and acidic juices such as orange juice. NO MINT OR MENTHOL PRODUCTS SO NO COUGH DROPS  2)  USE SUGARLESS CANDY INSTEAD (jolley ranchers)  3)  NO OIL BASED VITAMINS (no fish oil) 4)  Try substitute qvar 40 for flovent to see what effect it has on your chronic cough but work on smooth deep inhalation 5)  Work on inhaler technique:  relax and blow all the way out then take a nice smooth deep breath back in, triggering the inhaler at same time you start breathing in  6)  Nexium 40 mg Take one 30-60 min before first and last meals of the day (acid is to cough what oxygen to fire)  7)  Zpak should turn mucus back to white 8)  If all else fails take prednisone x 6 days only then return if not satisfied 9)  change to appt to see Dr Craige Cotta to 4 weeks Prescriptions: PREDNISONE 10 MG  TABS (PREDNISONE) 4 each am x 2days, 2x2days, 1x2days and stop  #14 x 0   Entered and Authorized by:   Nyoka Cowden MD   Signed by:   Nyoka Cowden MD on 11/18/2010    Method used:   Print then Mail to Patient   RxID:   [id number] Christena Deem Z-PAK 250 MG TABS (AZITHROMYCIN) use as directed  #1 x 0   Entered and Authorized by:   Nyoka Cowden MD   Signed by:   Nyoka Cowden MD on 11/18/2010   Method used:   Electronically to        [facility]* (retail)       [address]       [place], [place]  [id number]       Ph: [phone]       Fax: [phone]   RxID:   [id number]

## 2010-12-23 NOTE — Assessment & Plan Note (Signed)
Summary: 4 wks   Vital Signs:  Patient profile:   70 year old female Height:      65 inches Weight:      101.13 pounds BMI:     16.89 O2 Sat:      98 % on Room air Temp:     97.7 degrees F oral Pulse rate:   75 / minute BP sitting:   102 / 66  (left arm) Cuff size:   regular  Vitals Entered By: Carver Fila (December 16, 2010 2:14 PM)  O2 Flow:  Room air CC: 4 week follow up. Pt states her breahting hasn't been doing great. Pt c/o cough w/ yellow thick phlem, chest tightness, post nasal drip, stuffiness,  right side pain and into upper back x 2weeks. Pt states she is not sure if she can tell a diff using the flovent vs qvar.  Comments meds and allergies updated Phone number updated Carver Fila  December 16, 2010 2:14 PM    Copy to:    Primary Provider/Referring Provider:  Merlene Laughter, MD   CC:  4 week follow up. Pt states her breahting hasn't been doing great. Pt c/o cough w/ yellow thick phlem, chest tightness, post nasal drip, stuffiness, and right side pain and into upper back x 2weeks. Pt states she is not sure if she can tell a diff using the flovent vs qvar. [name]  History of Present Illness: 35 yowf quit smoking  in the 1970s   with hx of BOOP, bronchiectasis, recurrent aspiration on chronic reglan.   She has finished her antibiotics.  She never took prednisone.  She felt initial improvement after antibiotics.  Once she finished her antibiotics, her symptoms recurred.  She did not notice any difference with change from Qvar to Flovent.  She still has sinus congestion, cough, and occasional wheeze.  She has been getting occasional nose bleeds.  She has sputum production which is yellow.  She has been getting some discomfort in her back with coughing.  She feels like she has fluid in her chest.  She has not had fever.  She has been using ventolin once per day.  She feels like she has a tightness in her chest, and has trouble getting air into her lungs.   CXR  Procedure date:   12/16/2010  Findings:       CHEST - 2 VIEW    Comparison: CT chest 08/17/2010 and chest radiograph 08/03/2010    Findings: Trachea is midline.  Heart size normal.  Mild   bronchiectasis and nodularity are seen at both lung bases, as on   08/17/2010.  Findings  appear to have progressed slightly from   08/03/2010.  No pleural fluid.    IMPRESSION:   Mild progression of basilar predominant bronchiectasis and   nodularity.   Current Medications (verified): 1)  Flovent Hfa 110 Mcg/act Aero (Fluticasone Propionate  Hfa) .... Try Substituting Qvar 40 2 Every 12 Hours 2)  Singulair 10 Mg Tabs (Montelukast Sodium) .... One By Mouth At Bedtime 3)  Reglan 5 Mg Tabs (Metoclopramide Hcl) .... Take 1 Tablet By Mouth Three Times A Day Before Meals 4)  Premarin 0.45 Mg Tabs (Estrogens Conjugated) .... One Tablet By Mouth Once Daily 5)  Vitamin D 400 Unit Caps (Cholecalciferol) .... Take 1 Tablet By Mouth Once A Day 6)  Viactiv Multi-Vitamin  Chew (Multiple Vitamins-Calcium) .... Take 1 Tablet By Mouth Four Times A Day 7)  Multivitamins  Tabs (Multiple Vitamin) .Marland KitchenMarland KitchenMarland Kitchen  1 By Mouth Daily 8)  Mucinex 600 Mg Xr12h-Tab (Guaifenesin) .... One By Mouth Two Times A Day As Needed 9)  Flutter Valve .... Use Two Times A Day As Needed 10)  Tessalon Perles 100 Mg Caps (Benzonatate) .[name].. 1 Three Times A Day As Needed 11)  Fexofenadine Hcl 180 Mg Tabs (Fexofenadine Hcl) .... Once Daily  Allergies (verified): 1)  ! Sulfa 2)  ! Cortisone  Past History:  Past Medical History: Reviewed history from 11/18/2010 and no changes required. Severe GERD BOOP with acute on chronic bronchiolitis from lung biopsy 2003      - also concern for aspiration as cause      - PFT 12/31/09 FEV1 1.84(84%), FEV1% 73, TLC 4.94(98%), DLCO 77%, +BD      - HFA 50% p coaching November 18, 2010  Bronchiectasis      - CT chest 08/17/10 Raynaud's phenomenon Pneumonia  Past Surgical History: Reviewed history from 10/14/2009 and no  changes required. Right VATS lung biopsy 2003 Cholecystectomy 2005 Lumpectomy-left breast Total Abdominal Hysterectomy  Physical Exam  General:   thin.   Nose:  clear nasal discharge with crusted blood around left nares Mouth:  no oral lesion, MP 2, 2+ tonsills, no exudate Neck:  no JVD, no thyromegaly Lungs:  faint basilar rales, diminished breath sounds, faint wheezing with cough Heart:  regular rate and rhythm, S1, S2 without murmurs, rubs, gallops, or clicks Extremities:  no clubbing, cyanosis, edema, or deformity noted Neurologic:  normal CN II-XII and strength normal.   Cervical Nodes:  no significant adenopathy Psych:  alert and cooperative; normal mood and affect; normal attention span and concentration   Impression & Recommendations:  Problem # 1:  COUGH (ICD-786.2) From flare of BTX and sinus infection.  Will give course of levaquin and have her use symbicort for 2 weeks.  Advised her to call if she is not better.  Will try to hold off on using prednisone for now.  Problem # 2:  BRONCHIECTASIS (ICD-494.0) She will resume flovent after two week course of symbicort.  Problem # 3:  RHINITIS (ICD-472.0)  She is to continue singulair and allegra.  Medications Added to Medication List This Visit: 1)  Fexofenadine Hcl 180 Mg Tabs (Fexofenadine hcl) .... Once daily 2)  Ventolin Hfa 108 (90 Base) Mcg/act Aers (Albuterol sulfate) .... Two puffs up to four times per day as needed for cough, wheeze, sputum, or chest congestion 3)  Symbicort 160-4.5 Mcg/act Aero (Budesonide-formoterol fumarate) .... One puff two times a day for 2 weeks, then stop 4)  Levofloxacin 500 Mg Tabs (Levofloxacin) .... One once daily for 7 days  Complete Medication List: 1)  Flovent Hfa 110 Mcg/act Aero (Fluticasone propionate  hfa) .... Try substituting qvar 40 2 every 12 hours 2)  Singulair 10 Mg Tabs (Montelukast sodium) .... One by mouth at bedtime 3)  Fexofenadine Hcl 180 Mg Tabs (Fexofenadine hcl)  .... Once daily 4)  Reglan 5 Mg Tabs (Metoclopramide hcl) .... Take 1 tablet by mouth three times a day before meals 5)  Premarin 0.45 Mg Tabs (Estrogens conjugated) .... One tablet by mouth once daily 6)  Vitamin D 400 Unit Caps (Cholecalciferol) .... Take 1 tablet by mouth once a day 7)  Viactiv Multi-vitamin Chew (Multiple vitamins-calcium) .... Take 1 tablet by mouth four times a day 8)  Multivitamins Tabs (Multiple vitamin) .[name].. 1 by mouth daily 9)  Mucinex 600 Mg Xr12h-tab (Guaifenesin) .... One by mouth two times a day as  needed 10)  Flutter Valve  .... Use two times a day as needed 11)  Tessalon Perles 100 Mg Caps (Benzonatate) .[name].. 1 three times a day as needed 12)  Ventolin Hfa 108 (90 Base) Mcg/act Aers (Albuterol sulfate) .... Two puffs up to four times per day as needed for cough, wheeze, sputum, or chest congestion 13)  Symbicort 160-4.5 Mcg/act Aero (Budesonide-formoterol fumarate) .... One puff two times a day for 2 weeks, then stop 14)  Levofloxacin 500 Mg Tabs (Levofloxacin) .... One once daily for 7 days  Other Orders: T-2 View CXR (71020TC) Est. Patient Level IV (54098)  Patient Instructions: 1)  Levaquin 500 mg once daily for 7 days 2)  Symbicort one puff two times a day for 2 weeks, then stop 3)  Do not use Flovent while using symbicort 4)  Follow up in 4 months Prescriptions: LEVOFLOXACIN 500 MG TABS (LEVOFLOXACIN) one once daily for 7 days  #7 x 0   Entered and Authorized by:   [name] MD   Signed by:   [name] MD on 12/16/2010   Method used:   Electronically to        [facility]* (retail)       [address]       [place], [place]  [id number]       Ph: [phone]       Fax: [phone]   RxID:   [id number]    Orders Added: 1)  T-2 View CXR [71020TC] 2)  Est. Patient Level IV [44034]   Immunization History:  Influenza Immunization History:    Influenza:  historical (08/15/2010)   Immunization History:  Influenza  Immunization History:    Influenza:  Historical (08/15/2010)

## 2011-03-22 ENCOUNTER — Other Ambulatory Visit: Payer: Self-pay | Admitting: Internal Medicine

## 2011-03-30 NOTE — Assessment & Plan Note (Signed)
[facility]                         GASTROENTEROLOGY OFFICE NOTE   NAME:Collins, [name]                         MRN:          [id number]  DATE:08/24/2007                            DOB:          October 10, 1941    [name] is a very nice 70 year old white female with severe  gastroesophageal reflux disease, esophageal dysmotility and  gastroparesis and history of idiopathic pulmonary fibrosis with BOOP  syndrome and bronchiolitis. This was diagnosed by Dr.  Delford Field in 2003.  She also had a prior laparoscopic cholecystectomy in March of 2005. The  main GI evaluation took place in 2004, which included esophageal  manometry and 24 hour pH probe as well as upper endoscopy. She did not  have any evidence of Barrett's esophagus at that time, but she has  required large doses of proton pump inhibitors to control her reflux.  She is on Nexium 40 mg twice a day. Since her last appointment a year  ago, she has done quite well. She has been actually able to increase her  weight from 92 pounds to 99 pounds. She is on complete disability  because of her reflux as well as pulmonary fibrosis. Her voice and  hoarseness comes and goes depending on how much she uses her voice.   CURRENT MEDICATIONS:  1. Premarin 1.25 mg p.o. daily.  2. Nexium 20 mg p.o. b.i.d.  3. Multivitamin.  4. Vitamin D.   PHYSICAL EXAMINATION:  Blood pressure 118/82. Pulse 72, weight 99  pounds. She is very thin.  SKIN: Was warm and dry. There was palmar erythema and there was some  cyanotic discoloration of her nose.  LUNGS:  With decreased breath sounds. No wheezes.  COR: With quiet precordium. Normal S1, S2. Her ribcage showed some  intercostal wasting.  ABDOMEN: Was very thin and scaphoid with minimal tenderness in  subxyphoid area. Normoactive bowel sounds.  EXTREMITIES: No edema.  RECTAL: Not done. Last colonoscopy in 1987, was normal.   IMPRESSION:  A 70 year old white female with severe  gastroesophageal  reflux disease and laryngopharyngeal reflux who is disabled because of  the voice changes as well as because of her idiopathic pulmonary  fibrosis and BOOP syndrome. She is stable on high dose PPI's.   PLAN:  1. Continue Nexium 40 mg p.o. b.i.d. The patient is thinking about      switching to a mail order program.  2. She will need upper endoscopy in February 2009, which will be a      five year interval from last examination. I would consider also      doing colonoscopy in that time because it has been      11 years since last examination.  3. Gaviscon, take two p.o. p.r.n. reflux and odynophagia.     Hedwig Morton. Juanda Chance, MD  Electronically Signed    DMB/MedQ  DD: 08/24/2007  DT: 08/24/2007  Job #: [id number]   cc:   Teena Irani. Arlyce Dice, M.D.  Charlcie Cradle Delford Field, MD, FCCP

## 2011-04-02 NOTE — Op Note (Signed)
NAME:  [name], [name]                           ACCOUNT NO.:  [id number]   MEDICAL RECORD NO.:  [id number]                   PATIENT TYPE:  OBV   LOCATION:  0365                                 FACILITY:  [facility]   PHYSICIAN:  Gita Kudo, M.D.              DATE OF BIRTH:  Jan 16, 1941   DATE OF PROCEDURE:  02/19/2004  DATE OF DISCHARGE:                                 OPERATIVE REPORT   OPERATION:  Laparoscopic cholecystectomy with intraoperative cholangiogram.   SURGEON:  Gita Kudo, M.D.   ASSISTANT:  Leonie Man, M.D.   ANESTHESIA:  General endotracheal.   PREOPERATIVE DIAGNOSES:  Gallstones.   POSTOPERATIVE DIAGNOSES:  Gallstones.   CLINICAL COURSE:  A lean 70 year old female with chronic pulmonary fibrosis  and vague abdominal symptoms. Workup revealed gallstones.  Ultrasound showed  no acute inflammation.  HIDA scan was normal after morphine given to cause  contractions.  The operative cholangiogram looked normal.  Liver function  studies were normal.   FINDINGS:  The gallbladder was enlarged, not acutely inflamed. The  cholangiogram looked normal. The cystic duct and artery appeared normal in  size.  The liver looked a little scarred but not at all cirrhotic.   DESCRIPTION OF PROCEDURE:  Under satisfactory general endotracheal  anesthesia, having received IV antibiotics, the patient's abdomen was  prepped and draped in a standard fashion. A total of 30 mL of 0.5% Marcaine  with epinephrine was infiltrated for postop analgesia at the incision sites.  A midline incision made beneath the umbilicus and carried into the  peritoneum.  Controlled with a figure-of-eight #0 Vicryl suture and  operating Hussan port inserted, secured.  A good CO2 pneumoperitoneum was  established and the camera placed.  Under direct vision, two #5 ports were  placed laterally and a second #10 port medially.  Lateral graspers gave  excellent exposures and operating through the medial  port, the cystic artery  and cystic duct were each circumferentially dissected.  When certain of the  anatomy, a single clip was placed on the cystic duct near the gallbladder  and an incision made in it.  A percutaneously placed catheter was used to  obtain a good cholangiogram and then withdrawn.  The duct was controlled  with multiple clips as was the artery and each was cut.  Then the  gallbladder was removed from the liver bed using coagulating current for  dissection and hemostasis and a below upward dissection. The liver bed was  made dry by cautery, lavaged with saline and suctioned dry.  Following this,  the abdomen was checked for hemostasis, CO2 released, ports removed. Then a  #0 Vicryl figure-of-eight suture was used to close the fascia at the upper  medial port.  The midline fascia closed with the previous figure-of-eight  and a second interrupted #0 Vicryl.  The skin edges approximated with Steri-  Strips after 4-0 Vicryl used to  approximate the subcu.  Sterile dressings  were applied and the patient went to the recovery room from the operating  room in good condition.                                               Gita Kudo, M.D.    MRL/MEDQ  D:  02/19/2004  T:  02/19/2004  Job:  [id number]   cc:   [name], M.D. [facility]   Shan Levans, M.D. [facility]

## 2011-04-02 NOTE — Discharge Summary (Signed)
   NAME:  [name], [name]                      ACCOUNT NO.:  [id number]   MEDICAL RECORD NO.:  [id number]                   PATIENT TYPE:  NP   LOCATION:  2007                                 FACILITY:  [facility]   PHYSICIAN:  Levin Erp. Steward, P.A.                DATE OF BIRTH:  09/17/1941   DATE OF ADMISSION:  05/10/2002  DATE OF DISCHARGE:  05/14/2002                                 DISCHARGE SUMMARY   ADMITTING DIAGNOSIS:  Bilateral pulmonary infiltrates.   DISCHARGE DIAGNOSIS:  Bilateral pulmonary infiltrates.   HOSPITAL COURSE:  The patient was admitted to [facility] on May 10, 2002 secondary to bilateral pulmonary infiltrates and chronic lung  infections.  Because of this, she was seen and evaluated by Dr. Dorris Fetch  as an outpatient who recommended lung biopsy for a definitive diagnosis.  On  date of admission, the patient underwent a right video-assisted thoracoscopy  with bilateral lung biopsy x2.  Frozen section at this time revealed chronic  inflammation.  The patient's postoperative course was relatively uneventful.  Adequate pain control was achieved with PCA morphine.  This was discontinued  appropriately and the patient tolerated pain well with p.o. pain medication.  Her chest tubes were discontinued appropriately and she was subsequently  deemed stable for discharge on May 14, 2002.  At the time of discharge, her  final pathology was still pending.   DISCHARGE MEDICATIONS:  1. Tylox 1-2 tablets every 4-6 hours as needed for pain.  2. The patient was also instructed she could resume her home medications     which include:     a. Premarin.     b. Allegra.     c. Multivitamin.   ACTIVITY:  The patient was told no driving or strenuous activity.   DISCHARGE DIET:  Low-fat/low-salt.   WOUND CARE:  The patient was told she could shower and clean her incision  with soap and water.   DISPOSITION:  Home.   FOLLOW UP:  The patient was told to see Dr.  Dorris Fetch in approximately two  weeks.  The office will call her with the date and time of this appointment.  She was told to have a chest x-ray taken at the [facility]  one hour before this appointment.                                                Levin Erp. Vonita Moss, P.A.    BGS/MEDQ  D:  06/20/2002  T:  06/25/2002  Job:  [id number]

## 2011-04-02 NOTE — Op Note (Signed)
   NAME:  [name], [name]                      ACCOUNT NO.:  [id number]   MEDICAL RECORD NO.:  [id number]                   PATIENT TYPE:  AMB   LOCATION:  ENDO                                 FACILITY:  [facility]   PHYSICIAN:  [name], M.D. [facility]               DATE OF BIRTH:  [birth year]   DATE OF PROCEDURE:  DATE OF DISCHARGE:  01/08/2003                                 OPERATIVE REPORT   PROCEDURE:  Esophageal manometry report.   INDICATIONS FOR PROCEDURE:  A 70 year old patient with gastroesophageal  reflux, cough, and regurgitation of the food with solids.   DESCRIPTION OF PROCEDURE:  Manometry catheter passed through mouth in to the  esophagus and into the stomach without difficulty.  Lower esophageal  sphincter measured 5.5 cm in length with intra-abdominal ileus being 4.5 cm.  Lower esophageal pressure measured 40 mmHg, normal being 10-45 mmHg.  Residual pressure was 5.6 mmHg, therefore, the total relaxation of the ileus  was 75%.  This is a normal relaxation.   Esophageal peristalsis was measured in the distal, mid and proximal  esophagus.  After ten effective swallows amplitude of induration of the  contractions were normal.  There were only 4/10 peristaltic contractions.  Two of them were simultaneous, 2/10 were nontransmitted and two contractions  were retrograde, 6/10 contractions were abnormal.  Peristaltic contractions  in the midesophagus were also abnormal.  Peristaltic waste was 60% out of 10  and 40% abnormal contractions.   Upper esophageal sphincter was normal in amplitude and duration and  relaxation to the baseline.   IMPRESSION:  This is an abnormal esophageal motility study showing decreased  esophageal motility with nonpropulsive as well as stationary air retrograde  contractions.  Lower esophageal  sphincter was normal.                                               [name], M.D. [facility]    DB/MEDQ  D:  01/17/2003  T:  01/18/2003  Job:  [id number]

## 2011-04-02 NOTE — Op Note (Signed)
[facility]  Patient:    [name], [name] Visit Number: [id number] MRN: [id number]          Service Type: OUT Location: CARD Attending Physician:  Caleb Popp Dictated by:   Charlcie Cradle Delford Field, M.D. [facility] Proc. Date: 03/29/02 Admit Date:  03/29/2002                             Operative Report  PROCEDURE:  Bronchoscopy.  INDICATION:  Bilateral pulmonary infiltrates, evaluate for cause.  OPERATOR:  Charlcie Cradle. Delford Field, M.D.  ANESTHESIA:  1% Xylocaine local.  PREOPERATIVE MEDICATIONS:  4 mg Versed and 50 mg Demerol IV.  DESCRIPTION OF PROCEDURE:  The Pentax bronchoscope was introduced to the right naris.  The upper airways were visualized and were unremarkable.  The entire tracheobronchial tree was visualized and revealed diffuse tracheobronchitis with thick, tenacious mucus plugs.  They were clear white, plugging all diffuse orifices.  Diffuse tracheobronchitis was identified.  No endobronchial lesions were seen.  Bronchioloalveolar lavage was taken from the right middle lobe, 100 cc infused, 50 cc volume returned.  Biopsies were taken of the right upper lobe anterior segment x 5.  COMPLICATIONS:  None.  IMPRESSION:  Bilateral pulmonary infiltrates, rule out granulomatous infection, rule out opportunitist infection, rule out chronic airway infection, doubt malignancy.  RECOMMENDATIONS:  Follow up pathology and microbiology. Dictated by:   Charlcie Cradle Delford Field, M.D. [facility] Attending Physician:  Caleb Popp DD:  03/29/02 TD:  03/30/02 Job: [id number]

## 2011-04-02 NOTE — Op Note (Signed)
[place]  Patient:    [name], [name] Visit Number: [id number] MRN: [id number]          Service Type: SUR Location: 2000 2007 01 Attending Physician:  Charlett Lango Dictated by:   Salvatore Decent. Dorris Fetch, M.D. Proc. Date: 05/10/02 Admit Date:  05/10/2002   CC:         Charlcie Cradle. Delford Field, M.D. [facility]  Gloriajean Dell. Andrey Campanile, M.D.   Operative Report  PREOPERATIVE DIAGNOSIS:  Chronic infiltrates both lungs.  POSTOPERATIVE DIAGNOSIS:  Chronic infiltrates both lungs.  OPERATION:  Right video-assisted thoracic surgery lung biopsy x2 from right upper and middle lobes.  SURGEON:  Salvatore Decent. Dorris Fetch, M.D.  ASSISTANT:  Adair Patter, P.A.  ANESTHESIA:  General.  FINDINGS:  Mild adhesions.  Frozen section right upper lobe.  Biopsy showed chronic inflammatory changes.  Final diagnosis pending.  INDICATIONS:  [name] is a 70 year old white female who has had multiple episodes of pneumonia.  By CT scan she has chronic infiltrates bilaterally. She has undergone a diagnostic workup by Dr. Shan Levans which has failed to yield a definitive diagnosis.  She is now referred for lung biopsy to assist with diagnosis and planning treatment.  She understands that this is a video assisted lung biopsy and is for diagnostic and not therapeutic purposes. Indications, risks, benefits and alternatives were discussed in detail with the patient.  She understood and accepted the risks and agreed to proceed.  DESCRIPTION OF PROCEDURE:  [name] is brought to the preoperative holding area on May 10, 2002.  Central venous access was obtained.  Arterial blood pressure monitoring catheter was placed.  The patient was taken to the operating room, anesthestized and intubated with a double lumen endotracheal tube.  PAS hose were placed for DVT prophylaxis.  The patient was placed in left lateral decubitus position and the right chest was prepped and draped in the  usual fashion.  A single lung ventilation of the left lung was carried out, and the right lung was deflated.  Intravenous antibiotics were administered.  The patient was extremely thin.  The ribs were easily palpable.  Incision was made in the mid axillary line and the lower chest. This was carried through the skin and subcutaneous tissue.  The chest was bluntly entered using a Hemostat.  A port was inserted, and the thoracoscope was placed through the port.  There were adhesions of the right upper lobe from the visceral to the parietal pleura.  Additional incisions were made in approximately the fifth intercostal space posteriorly and anteriorly for instrumentation.  Adhesions were taken down with electrocautery.  There were adhesions in the major fissure which were taken down to allow access to the inferior aspect of the right upper lobe which appeared boggy and edematous and correlated with the infiltrate on CT scan.  A lung biopsy was performed with multiple firings of an ATB 45 stapler.  The specimen was removed.  Portion was sent for frozen section.  The remainder was sent for cultures for AFB, fungus, bacteria, and virus.  There were additional adhesions anteriorly in the right middle lobe again in the area which correlated with the infiltrate on the CT scan.  These were taken down, and another biopsy was taken again with multiple firings of the ATB 45 stapler.  These specimens were sent for cultures as well as permanent section.  The staple lines were inspected.  There was good hemostasis at both staple lines.  The chest was  irrigated with one liter of warm normal saline containing 1 g of vancomycin.  The port sites were inspected and had good hemostasis.  A 28 French chest tube was placed through the anterior port site and secured with a #1 silk suture.  Frozen section returned chronic inflammation.  No further diagnosis could be made on frozen section alone given that there was  abnormal lung present in the biopsy which correlated with the infiltrate on CT scan.  The right lung was reinflated. The wounds then were closed in a standard fashion with subcuticular skin closures.  The patient was placed back in the supine position and was extubated in the operating room and taken to the recovery room in stable condition.Dictated by:   Salvatore Decent Dorris Fetch, M.D. Attending Physician:  Charlett Lango DD:  05/10/02 TD:  05/12/02 Job: [id number]

## 2011-06-24 ENCOUNTER — Ambulatory Visit (INDEPENDENT_AMBULATORY_CARE_PROVIDER_SITE_OTHER): Payer: Medicare Other | Admitting: Pulmonary Disease

## 2011-06-24 ENCOUNTER — Encounter: Payer: Self-pay | Admitting: Pulmonary Disease

## 2011-06-24 VITALS — BP 108/62 | HR 66 | Temp 97.5°F | Ht 65.0 in | Wt 103.2 lb

## 2011-06-24 DIAGNOSIS — J479 Bronchiectasis, uncomplicated: Secondary | ICD-10-CM

## 2011-06-24 NOTE — Patient Instructions (Signed)
Follow up in 6 months 

## 2011-06-24 NOTE — Progress Notes (Signed)
  Subjective:    Patient ID: [name], female    DOB: 12-23-1940, 70 y.o.   MRN: [id number]  HPI 9 yowf quit smoking in the 1970s with hx of BOOP, bronchiectasis, recurrent aspiration on chronic reglan.   She has been doing well.  She does not have much cough or wheeze.  She denies chest pain, fever, or hemoptysis.  Her sinuses are doing okay.  She rides 20 miles per day on her bike w/o difficulty.  She has been working at the [employer] this summer.  Review of Systems     Objective:   Physical Exam BP 108/62  Pulse 66  Temp(Src) 97.5 F (36.4 C) (Oral)  Ht 5\' 5"  (1.651 m)  Wt 103 lb 3.2 oz (46.811 kg)  BMI 17.17 kg/m2  SpO2 99%  General: thin.  Nose: clear nasal discharge with crusted blood around left nares  Mouth: no oral lesion, MP 2, 2+ tonsills, no exudate  Neck: no JVD, no thyromegaly  Lungs: faint basilar rales, diminished breath sounds, faint wheezing with cough  Heart: regular rate and rhythm, S1, S2 without murmurs, rubs, gallops, or clicks  Extremities: no clubbing, cyanosis, edema, or deformity noted  Neurologic: normal CN II-XII and strength normal.  Cervical Nodes: no significant adenopathy  Psych: alert and cooperative; normal mood and affect; normal attention span and concentration     Assessment & Plan:

## 2011-06-24 NOTE — Assessment & Plan Note (Addendum)
She has recovered from her exacerbation at last visit.  She is to continue with flovent, singulair and prn albuterol.

## 2011-08-26 ENCOUNTER — Other Ambulatory Visit: Payer: Self-pay

## 2011-11-04 ENCOUNTER — Ambulatory Visit (INDEPENDENT_AMBULATORY_CARE_PROVIDER_SITE_OTHER): Payer: Medicare Other | Admitting: Adult Health

## 2011-11-04 ENCOUNTER — Encounter: Payer: Self-pay | Admitting: Adult Health

## 2011-11-04 DIAGNOSIS — J019 Acute sinusitis, unspecified: Secondary | ICD-10-CM

## 2011-11-04 MED ORDER — AMOXICILLIN-POT CLAVULANATE 875-125 MG PO TABS: 1.0000 | ORAL_TABLET | Freq: Two times a day (BID) | ORAL | Status: AC

## 2011-11-04 NOTE — Patient Instructions (Signed)
Augmentin 875mg  Twice daily  For 10 days -take with food.  Mucinex DM Twice daily  As needed  Cough /congestion  Saline nasal rinses As needed   Fluids and rest  Please contact office for sooner follow up if symptoms do not improve or worsen or seek emergency care

## 2011-11-04 NOTE — Progress Notes (Signed)
  Subjective:    Patient ID: [name], female    DOB: 02-22-1941, 70 y.o.   MRN: [id number]  HPI 63 yowf quit smoking in the 1970s with hx of BOOP, bronchiectasis, recurrent aspiration on chronic reglan.   11/04/2011 Acute OV  Complains of 2 weeks of HA, body aches, prod cough with gray/yellow mucus, wheezing, increased SOB x2 weeks, nose bleeds x3nights, chills/sweats last week.  Has sinus pain and pressure with thick sinus congestion. OTC not helping.     Review of Systems Constitutional:   No  weight loss, night sweats,  + Fevers, chills, fatigue, or  lassitude.  HEENT:   No headaches,  Difficulty swallowing,  Tooth/dental problems, or  Sore throat,                No sneezing, itching, ear ache,  +nasal congestion, post nasal drip,   CV:  No chest pain,  Orthopnea, PND, swelling in lower extremities, anasarca, dizziness, palpitations, syncope.   GI  No heartburn, indigestion, abdominal pain, nausea, vomiting, diarrhea, change in bowel habits, loss of appetite, bloody stools.   Resp:   No coughing up of blood.    No chest wall deformity  Skin: no rash or lesions.  GU: no dysuria, change in color of urine, no urgency or frequency.  No flank pain, no hematuria   MS:  No joint pain or swelling.  No decreased range of motion.  No back pain.  Psych:  No change in mood or affect. No depression or anxiety.  No memory loss.         Objective:   Physical Exam GEN: A/Ox3; pleasant , NAD, well nourished   HEENT:  [place]/AT,  EACs-clear, TMs-wnl, NOSE-clear, THROAT-clear, no lesions,   Max sinus tenderness  NECK:  Supple w/ fair ROM; no JVD; normal carotid impulses w/o bruits; no thyromegaly or nodules palpated; no lymphadenopathy.  RESP  Coarse BS w/ no accessory muscle use, no dullness to percussion  CARD:  RRR, no m/r/g  , no peripheral edema, pulses intact, no cyanosis or clubbing.  GI:   Soft & nt; nml bowel sounds; no organomegaly or masses detected.  Musco: Warm bil,  no deformities or joint swelling noted.   Neuro: alert, no focal deficits noted.    Skin: Warm, no lesions or rashes         Assessment & Plan:

## 2011-11-05 DIAGNOSIS — J019 Acute sinusitis, unspecified: Secondary | ICD-10-CM | POA: Insufficient documentation

## 2011-11-05 NOTE — Assessment & Plan Note (Signed)
[place]   Plan :  Augmentin 875mg  Twice daily  For 10 days -take with food.  Mucinex DM Twice daily  As needed  Cough /congestion  Saline nasal rinses As needed   Fluids and rest  Please contact office for sooner follow up if symptoms do not improve or worsen or seek emergency care

## 2011-12-13 ENCOUNTER — Other Ambulatory Visit: Payer: Self-pay | Admitting: Pulmonary Disease

## 2011-12-13 ENCOUNTER — Encounter: Payer: Self-pay | Admitting: Adult Health

## 2011-12-13 ENCOUNTER — Ambulatory Visit (INDEPENDENT_AMBULATORY_CARE_PROVIDER_SITE_OTHER): Payer: Medicare Other | Admitting: Adult Health

## 2011-12-13 ENCOUNTER — Other Ambulatory Visit: Payer: Self-pay | Admitting: Adult Health

## 2011-12-13 ENCOUNTER — Ambulatory Visit (INDEPENDENT_AMBULATORY_CARE_PROVIDER_SITE_OTHER)
Admission: RE | Admit: 2011-12-13 | Discharge: 2011-12-13 | Disposition: A | Discharge status: Home / Self Care | Payer: Medicare Other | Source: Ambulatory Visit | Attending: Adult Health | Admitting: Adult Health

## 2011-12-13 VITALS — BP 108/72 | HR 88 | Temp 96.6°F | Ht 65.0 in | Wt 99.6 lb

## 2011-12-13 DIAGNOSIS — J479 Bronchiectasis, uncomplicated: Secondary | ICD-10-CM

## 2011-12-13 MED ORDER — LEVALBUTEROL HCL 0.63 MG/3ML IN NEBU
0.6300 mg | INHALATION_SOLUTION | Freq: Once | RESPIRATORY_TRACT | Status: AC
  Administered 2011-12-13: 0.63 mg via RESPIRATORY_TRACT

## 2011-12-13 MED ORDER — LEVOFLOXACIN 500 MG PO TABS: 500.0000 mg | ORAL_TABLET | Freq: Every day | ORAL | Status: AC

## 2011-12-13 NOTE — Progress Notes (Signed)
  Subjective:    Patient ID: [name], female    DOB: 16-Dec-1940, 71 y.o.   MRN: [id number]  HPI 66 yowf quit smoking in the 1970s with hx of BOOP, bronchiectasis, recurrent aspiration on chronic reglan.   11/04/2011 Acute OV  Complains of 2 weeks of HA, body aches, prod cough with gray/yellow mucus, wheezing, increased SOB x2 weeks, nose bleeds x3nights, chills/sweats last week.  Has sinus pain and pressure with thick sinus congestion. OTC not helping.  >tx w/ Augmentin x 10 d   12/13/11 Acute OV  Complains of hoarseness, prod cough with thick yellow mucus, runny nose with clear drainage, increased SOB x3days. Seen 1 month ago with Bronchiectasis flare. Tx w/ Augmentin which she improved. Then symptoms returned 3 days ago. No fever or hemoptysis. No n/v/d.  OTC not helping. No sinus pain or pressure.    Review of Systems Constitutional:   No  weight loss, night sweats,  Fevers, chills, + fatigue, or  lassitude.  HEENT:   No headaches,  Difficulty swallowing,  Tooth/dental problems, or  Sore throat,                No sneezing, itching, ear ache, nasal congestion, post nasal drip,   CV:  No chest pain,  Orthopnea, PND, swelling in lower extremities, anasarca, dizziness, palpitations, syncope.   GI  No heartburn, indigestion, abdominal pain, nausea, vomiting, diarrhea, change in bowel habits, loss of appetite, bloody stools.   Resp:    No coughing up of blood.     No chest wall deformity  Skin: no rash or lesions.  GU: no dysuria, change in color of urine, no urgency or frequency.  No flank pain, no hematuria   MS:  No joint pain or swelling.  No decreased range of motion.  No back pain.  Psych:  No change in mood or affect. No depression or anxiety.  No memory loss.         Objective:   Physical Exam GEN: A/Ox3; pleasant , NAD, well nourished   HEENT:  [place]/AT,  EACs-clear, TMs-wnl, NOSE-clear drainage , THROAT-clear, no lesions, no postnasal drip or exudate noted.    NECK:  Supple w/ fair ROM; no JVD; normal carotid impulses w/o bruits; no thyromegaly or nodules palpated; no lymphadenopathy.  RESP  Coarse BS w/ scattered rhonchi , no  accessory muscle use, no dullness to percussion  CARD:  RRR, no m/r/g  , no peripheral edema, pulses intact, no cyanosis or clubbing.  GI:   Soft & nt; nml bowel sounds; no organomegaly or masses detected.  Musco: Warm bil, no deformities or joint swelling noted.   Neuro: alert, no focal deficits noted.    Skin: Warm, no lesions or rashes         Assessment & Plan:

## 2011-12-13 NOTE — Patient Instructions (Signed)
Levaquin 500mg  daily for 10 days  Mucinex DM Twice daily  As needed  Cough/congestion  Fluids and rest  Tessalon Three times a day  As needed  Cough Hold Flovent for now- Use Dulera 2 puffs Twice daily  Until sample is gone then back to Flovent  Please contact office for sooner follow up if symptoms do not improve or worsen or seek emergency care   follow up Dr. Craige Cotta  In 1 month as planned  I will call with xray results.

## 2011-12-14 MED ORDER — BENZONATATE 100 MG PO CAPS: 100.0000 mg | ORAL_CAPSULE | Freq: Three times a day (TID) | ORAL | Status: DC | PRN

## 2011-12-14 NOTE — Assessment & Plan Note (Signed)
Exacerbation   Plan:  Levaquin 500mg  daily for 10 days  Mucinex DM Twice daily  As needed  Cough/congestion  Fluids and rest  Tessalon Three times a day  As needed  Cough Hold Flovent for now- Use Dulera 2 puffs Twice daily  Until sample is gone then back to Flovent  Please contact office for sooner follow up if symptoms do not improve or worsen or seek emergency care   follow up Dr. Craige Cotta  In 1 month as planned  I will call with xray results.

## 2011-12-14 NOTE — Progress Notes (Signed)
Reviewed and agree with assessment/plan.  Dg Chest 2 View  12/13/2011  *RADIOLOGY REPORT*  Clinical Data: Cough and courses for the past 340 days.  Ex-smoker. History of pulmonary fibrosis.  CHEST - 2 VIEW  Comparison: None.  Findings: Lungs appear hyperexpanded with flattening of the hemidiaphragms, increased retrosternal air space and pruning of the pulmonary vasculature in the periphery, suggestive of underlying COPD.  Mild diffuse bronchial wall thickening.  Tram track appearance in the lower lungs to suggest regions of traction bronchiectasis.  No definite consolidative airspace disease.  No pleural effusions.  Heart size is normal.  Atherosclerosis of the thoracic aorta.  IMPRESSION:  1.  Appearance of chest is very similar compared to prior examinations.  Findings again suggest COPD. 2. Mild diffuse bronchial wall thickening and evidence of patchy areas of cylindrical bronchiectasis again noted.  This appearance is consistent with that seen on prior CT of the thorax 08/17/2010, and is not likely representative of an interstitial lung disease; rather, this is more likely to be representative of a chronic indolent atypical infectious process such as MAI.  Original Report Authenticated By: Florencia Reasons, M.D.

## 2011-12-16 ENCOUNTER — Telehealth: Payer: Self-pay | Admitting: Allergy

## 2011-12-16 MED ORDER — HYDROCOD POLST-CHLORPHEN POLST 10-8 MG/5ML PO LQCR: 5.0000 mL | Freq: Two times a day (BID) | ORAL | Status: DC | PRN

## 2011-12-16 NOTE — Telephone Encounter (Signed)
rx is not from [place].  Can has Tussionex #4oz 1 tsp every 12 hr As needed  Cough  May make you sleep.  No refills

## 2011-12-16 NOTE — Telephone Encounter (Signed)
[place]  IS  REQUESTING RX FOR TUSSIONEX SYRUP. Allergies  Allergen Reactions  . Cortisone   . Sulfonamide Derivatives    TAMMY PLEASE ADVISE

## 2011-12-16 NOTE — Telephone Encounter (Signed)
I do not see it on her meds list  Can yall look into for me please

## 2011-12-16 NOTE — Telephone Encounter (Signed)
Reviewed pt's medication history in both [facility] and [facility] and I do not see where we have prescribed Tussionex for pt.

## 2012-01-18 ENCOUNTER — Ambulatory Visit: Payer: Medicare Other | Admitting: Pulmonary Disease

## 2012-02-08 ENCOUNTER — Ambulatory Visit: Payer: Medicare Other | Admitting: Pulmonary Disease

## 2012-02-25 ENCOUNTER — Encounter: Payer: Self-pay | Admitting: Pulmonary Disease

## 2012-02-25 ENCOUNTER — Ambulatory Visit (INDEPENDENT_AMBULATORY_CARE_PROVIDER_SITE_OTHER): Payer: Medicare Other | Admitting: Pulmonary Disease

## 2012-02-25 VITALS — BP 100/60 | HR 75 | Temp 98.2°F | Ht 65.0 in | Wt 95.4 lb

## 2012-02-25 DIAGNOSIS — J479 Bronchiectasis, uncomplicated: Secondary | ICD-10-CM

## 2012-02-25 NOTE — Assessment & Plan Note (Signed)
She has recovered from her sinus infection in January.  She is well maintained on her current regimen.

## 2012-02-25 NOTE — Progress Notes (Signed)
Chief Complaint  Patient presents with  . Follow-up    Pt states her breathing has been okay. Still chest tx. denies any wheezing, cough. has no concerns today    History of Present Illness: BRYNLIE DAZA is a 71 y.o. female former smoker with hx of BOOP, BTX, and recurrent aspiration.  She was treated for a sinus infection and BTX exacerbation in January.  She has been doing better since.  She gets occasional cough and clear sputum.  She denies fever, wheeze, or hemoptysis.    She has been enjoying her grandchildren playing soccer.  This helps keep her active.  She has been getting some nasal congestion with change of seasons, but this is not too bad at present.   Past Medical History  Diagnosis Date  . GERD (gastroesophageal reflux disease)     severe  . BOOP (bronchiolitis obliterans with organizing pneumonia)   . Bronchiectasis   . Raynaud's disease     Past Surgical History  Procedure Date  . Cholecystectomy 2005  . Breast lumpectomy     left  . Total abdominal hysterectomy   . Lung biopsy     vats right 2003    Allergies  Allergen Reactions  . Cortisone   . Sulfonamide Derivatives     Physical Exam:  Blood pressure 100/60, pulse 75, temperature 98.2 F (36.8 C), temperature source Oral, height 5\' 5"  (1.651 m), weight 95 lb 6.4 oz (43.273 kg), SpO2 95.00%. Body mass index is 15.88 kg/(m^2). Wt Readings from Last 2 Encounters:  02/25/12 95 lb 6.4 oz (43.273 kg)  12/13/11 99 lb 9.6 oz (45.178 kg)    General - thin, no distress HEENT - no sinus tenderness, no oral exudate, no LAN Cardiac - s1s2 regular, no murmur Chest - prolonged exhalation, normal respiratory excursion, barrel chest, no wheeze/rales Abdomen - soft, nontender Extremities - no edema Skin - no rashes Neurologic - normal strength Psychiatric - normal mood, behavior  Dg Chest 2 View  12/13/2011  *RADIOLOGY REPORT*  Clinical Data: Cough and courses for the past 340 days.  Ex-smoker.  History of pulmonary fibrosis.  CHEST - 2 VIEW  Comparison: None.  Findings: Lungs appear hyperexpanded with flattening of the hemidiaphragms, increased retrosternal air space and pruning of the pulmonary vasculature in the periphery, suggestive of underlying COPD.  Mild diffuse bronchial wall thickening.  Tram track appearance in the lower lungs to suggest regions of traction bronchiectasis.  No definite consolidative airspace disease.  No pleural effusions.  Heart size is normal.  Atherosclerosis of the thoracic aorta.  IMPRESSION:  1.  Appearance of chest is very similar compared to prior examinations.  Findings again suggest COPD. 2. Mild diffuse bronchial wall thickening and evidence of patchy areas of cylindrical bronchiectasis again noted.  This appearance is consistent with that seen on prior CT of the thorax 08/17/2010, and is not likely representative of an interstitial lung disease; rather, this is more likely to be representative of a chronic indolent atypical infectious process such as MAI.   Original Report Authenticated By: Florencia Reasons, M.D.     Assessment/Plan:  Outpatient Encounter Prescriptions as of 02/25/2012  Medication Sig Dispense Refill  . albuterol (VENTOLIN HFA) 108 (90 BASE) MCG/ACT inhaler Inhale 2 puffs into the lungs every 6 (six) hours as needed.        . benzonatate (TESSALON) 100 MG capsule Take 1 capsule (100 mg total) by mouth 3 (three) times daily as needed.  30 capsule  3  .  Cholecalciferol (VITAMIN D) 400 UNITS capsule Take 400 Units by mouth daily.        [name] esomeprazole (NEXIUM) 40 MG capsule Take 40 mg by mouth as needed.        [name] estrogens, conjugated, (PREMARIN) 0.45 MG tablet Take 0.45 mg by mouth daily. Take daily for 21 days then do not take for 7 days.       . fexofenadine (ALLEGRA) 180 MG tablet Take 180 mg by mouth daily.        . fluticasone (FLOVENT HFA) 110 MCG/ACT inhaler Inhale 2 puffs into the lungs 2 (two) times daily.        [name] guaiFENesin  (MUCINEX) 600 MG 12 hr tablet Take 600 mg by mouth 2 (two) times daily as needed.        . montelukast (SINGULAIR) 10 MG tablet TAKE 1 TABLET AT BEDTIME  90 tablet  2  . Multiple Vitamin (MULTIVITAMIN) tablet Take 1 tablet by mouth daily.        . Multiple Vitamins-Calcium (VIACTIV MULTI-VITAMIN) CHEW Chew 1 tablet by mouth 4 (four) times daily.        [name] DISCONTD: chlorpheniramine-HYDROcodone (TUSSIONEX) 10-8 MG/5ML LQCR Take 5 mLs by mouth every 12 (twelve) hours as needed (cough). May make you sleepy.  120 mL  0  . DISCONTD: REGLAN 5 MG tablet TAKE  (1)  TABLET  THREE TIMES DAILY BEFORE MEALS.(AT LEAST 30 MINUTES BE-  FORE MEALS)  90 each  0    Pernell Lenoir Pager:  [phone] 02/25/2012, 3:26 PM

## 2012-02-25 NOTE — Patient Instructions (Signed)
Follow up in 6 months 

## 2012-02-29 ENCOUNTER — Telehealth: Payer: Self-pay | Admitting: Internal Medicine

## 2012-02-29 NOTE — Telephone Encounter (Signed)
LOV ( Pulmonary) faxed to [facility]  @ [phone] 02/29/12/KM

## 2012-04-07 ENCOUNTER — Encounter: Payer: Self-pay | Admitting: Adult Health

## 2012-04-07 ENCOUNTER — Ambulatory Visit (INDEPENDENT_AMBULATORY_CARE_PROVIDER_SITE_OTHER): Payer: Medicare Other | Admitting: Adult Health

## 2012-04-07 VITALS — BP 90/68 | HR 68 | Temp 97.0°F | Ht 65.0 in | Wt 98.2 lb

## 2012-04-07 DIAGNOSIS — J189 Pneumonia, unspecified organism: Secondary | ICD-10-CM | POA: Insufficient documentation

## 2012-04-07 DIAGNOSIS — J069 Acute upper respiratory infection, unspecified: Secondary | ICD-10-CM

## 2012-04-07 MED ORDER — CEFDINIR 300 MG PO CAPS: 300.0000 mg | ORAL_CAPSULE | Freq: Two times a day (BID) | ORAL | Status: AC

## 2012-04-07 NOTE — Progress Notes (Signed)
Reviewed and agree with assessment/plan. 

## 2012-04-07 NOTE — Patient Instructions (Addendum)
Omnicef 300mg  Twice daily  7 days   Mucinex DM Twice daily  As needed  Cough/congestion  Fluids and rest  Saline nasal rinses As needed   Please contact office for sooner follow up if symptoms do not improve or worsen or seek emergency care

## 2012-04-07 NOTE — Assessment & Plan Note (Signed)
Omnicef 300mg Twice daily  7 days   Mucinex DM Twice daily  As needed  Cough/congestion  Fluids and rest  Saline nasal rinses As needed   Please contact office for sooner follow up if symptoms do not improve or worsen or seek emergency care   

## 2012-04-07 NOTE — Progress Notes (Signed)
  Subjective:    Patient ID: [name], female    DOB: 1941/05/23, 71 y.o.   MRN: [id number]  HPI  81 yowf quit smoking in the 1970s with hx of BOOP, bronchiectasis, recurrent aspiration on chronic reglan.    04/07/2012 Acute OV  Complains of sore throat, runny nose, head congestion, body aches/neck soreness, chills, yellow nasal drainage, PND x4-5days.  Cough is not bad yet.  Sore throat is worse on left. Painful to swallow.  No recent abx.  No recent travel .    Review of Systems  Constitutional:   No  weight loss, night sweats,  Fevers, chills, + fatigue, or  lassitude.  HEENT:   No headaches,  Difficulty swallowing,  Tooth/dental problems, or   +Sore throat,                No sneezing, itching, ear ache, + nasal congestion, post nasal drip,   CV:  No chest pain,  Orthopnea, PND, swelling in lower extremities, anasarca, dizziness, palpitations, syncope.   GI  No heartburn, indigestion, abdominal pain, nausea, vomiting, diarrhea, change in bowel habits, loss of appetite, bloody stools.   Resp:    No coughing up of blood.     No chest wall deformity  Skin: no rash or lesions.  GU: no dysuria, change in color of urine, no urgency or frequency.  No flank pain, no hematuria   MS:  No joint pain or swelling.  No decreased range of motion.  No back pain.  Psych:  No change in mood or affect. No depression or anxiety.  No memory loss.         Objective:   Physical Exam  GEN: A/Ox3; pleasant , NAD, well nourished   HEENT:  [place]/AT,  EACs-clear, TMs-wnl, NOSE-clear drainage , THROAT-clear, no lesions, no postnasal drip or exudate noted.   NECK:  Supple w/ fair ROM; no JVD; normal carotid impulses w/o bruits; no thyromegaly or nodules palpated; no lymphadenopathy.  RESP  Coarse BS w/ no wheezing  no  accessory muscle use, no dullness to percussion  CARD:  RRR, no m/r/g  , no peripheral edema, pulses intact, no cyanosis or clubbing.  GI:   Soft & nt; nml bowel sounds;  no organomegaly or masses detected.  Musco: Warm bil, no deformities or joint swelling noted.   Neuro: alert, no focal deficits noted.    Skin: Warm, no lesions or rashes         Assessment & Plan:

## 2012-04-11 ENCOUNTER — Telehealth: Payer: Self-pay | Admitting: Pulmonary Disease

## 2012-04-11 MED ORDER — CEFDINIR 300 MG PO CAPS: 300.0000 mg | ORAL_CAPSULE | Freq: Two times a day (BID) | ORAL | Status: AC

## 2012-04-11 MED ORDER — FIRST-DUKES MOUTHWASH MT SUSP: OROMUCOSAL | Status: DC

## 2012-04-11 NOTE — Telephone Encounter (Signed)
[email or web address].Diavian Furgason, CMA  

## 2012-04-11 NOTE — Telephone Encounter (Signed)
Can extend Omnicef for additonal 3 days  Omnicef 300mg  Twice daily  #6 .  Take w/ food Eat yogurt while on abx.  May call in Majic Mouthwash #4 oz 1 tsp Four times a day  As needed  Sore throat ,swish/swallow, no refills  Please contact office for sooner follow up if symptoms do not improve or worsen or seek emergency care

## 2012-04-11 NOTE — Telephone Encounter (Signed)
I spoke with the pt and she states she does not feel any improvement in her symptoms since starting abx 5 days ago. Pt states she took her first dose on Friday and has taken one today making it 5 doses. She states her throat is still extremely sore and is painful to swallow. She also states she is still having a productive cough with yellow phlegm, and is also blowing yellow mucus from sinuses. She is also very hoarse. Pt states she wants to know is this normal or should she be seeing some kind of improvement by now.  Please advise.Carron Curie, CMA Allergies  Allergen Reactions  . Cortisone   . Sulfonamide Derivatives

## 2012-04-11 NOTE — Telephone Encounter (Signed)
Pt returned triage's call.  Holly D Pryor ° °

## 2012-04-11 NOTE — Telephone Encounter (Signed)
Pt advised and rx sent. Mischell Branford, CMA  

## 2012-07-19 ENCOUNTER — Other Ambulatory Visit: Payer: Self-pay | Admitting: Internal Medicine

## 2012-07-19 NOTE — Telephone Encounter (Signed)
Advised patient that we are unable to refill her medications as we have not seen her in almost 3 years. She has been scheduled for an office visit on 09/01/12 to see Dr Juanda Chance as she complains of "awful reflux." Patient advised to follow antireflux measures, try omeprazole OTC (until office visit) and let us know if this does not help as we may have to get her to see an extender sooner. Patient verbalizes understanding.

## 2012-07-21 ENCOUNTER — Telehealth: Payer: Self-pay | Admitting: Pulmonary Disease

## 2012-07-21 NOTE — Telephone Encounter (Signed)
lmomtcb x1 for pt--form was placed upfront for pick up

## 2012-07-24 NOTE — Telephone Encounter (Signed)
Spoke with pt and notified letter ready for pick up. She has already picked up. Nothing further needed.

## 2012-08-10 ENCOUNTER — Encounter: Payer: Self-pay | Admitting: *Deleted

## 2012-08-17 ENCOUNTER — Other Ambulatory Visit: Payer: Self-pay | Admitting: Pulmonary Disease

## 2012-08-31 ENCOUNTER — Ambulatory Visit (INDEPENDENT_AMBULATORY_CARE_PROVIDER_SITE_OTHER): Payer: Medicare Other | Admitting: Pulmonary Disease

## 2012-08-31 ENCOUNTER — Encounter: Payer: Self-pay | Admitting: Pulmonary Disease

## 2012-08-31 VITALS — BP 104/70 | HR 70 | Temp 97.6°F | Ht 65.0 in | Wt 91.0 lb

## 2012-08-31 DIAGNOSIS — J479 Bronchiectasis, uncomplicated: Secondary | ICD-10-CM

## 2012-08-31 DIAGNOSIS — J069 Acute upper respiratory infection, unspecified: Secondary | ICD-10-CM

## 2012-08-31 MED ORDER — LORATADINE 10 MG PO TABS: 10.0000 mg | ORAL_TABLET | Freq: Every day | ORAL | Status: DC

## 2012-08-31 MED ORDER — BENZONATATE 100 MG PO CAPS: 100.0000 mg | ORAL_CAPSULE | Freq: Three times a day (TID) | ORAL | Status: DC | PRN

## 2012-08-31 NOTE — Patient Instructions (Signed)
Follow up in 6 months 

## 2012-08-31 NOTE — Progress Notes (Signed)
Chief Complaint  Patient presents with  . Follow-up    no change. no complaints today. Patient requesting 90 day supplies all Rxs...needs refill of Tessalon Pearles    History of Present Illness: [name] is a 71 y.o. female former smoker with hx of BOOP, BTX, and recurrent aspiration.  She was treated for chest and sinus infection in September.  She is doing better.  She still gets hoarse with sore throat, but this is improving.  Her sinuses are better.  She gets occasional headaches.  She has a dry cough now, and uses tessalon.  She is not needing to use ventolin much.  Tests: PFT 12/31/09>>FEV1 1.84(84%), FEV1% 73, TLC 4.94(98%), DLCO 77%, +BD. CT chest 08/17/10>>mild biapical scarring, basilar peribronchovascular nodularity and mild bronchiectasis.   Past Medical History  Diagnosis Date  . GERD (gastroesophageal reflux disease)     severe  . BOOP (bronchiolitis obliterans with organizing pneumonia)   . Bronchiectasis   . Raynaud's disease   . Pneumonia   . Esophageal dysmotility   . Gastroparesis     Past Surgical History  Procedure Date  . Cholecystectomy 2005  . Breast lumpectomy     left  . Total abdominal hysterectomy   . Lung biopsy     vats right 2003    Allergies  Allergen Reactions  . Cortisone   . Sulfonamide Derivatives     Physical Exam:  Filed Vitals:   08/31/12 1627  BP: 104/70  Pulse: 70  Temp: 97.6 F (36.4 C)  TempSrc: Oral  Height: 5\' 5"  (1.651 m)  Weight: 91 lb (41.277 kg)  SpO2: 94%    Body mass index is 15.14 kg/(m^2).   Wt Readings from Last 2 Encounters:  08/31/12 91 lb (41.277 kg)  04/07/12 98 lb 3.2 oz (44.543 kg)    General - thin, no distress HEENT - no sinus tenderness, no oral exudate, no LAN Cardiac - s1s2 regular, no murmur Chest - prolonged exhalation, normal respiratory excursion, barrel chest, no wheeze/rales Abdomen - soft, nontender Extremities - no edema Skin - no rashes Neurologic - normal  strength Psychiatric - normal mood, behavior   Assessment/Plan:  Outpatient Encounter Prescriptions as of 08/31/2012  Medication Sig Dispense Refill  . albuterol (VENTOLIN HFA) 108 (90 BASE) MCG/ACT inhaler Inhale 2 puffs into the lungs every 6 (six) hours as needed.        . benzonatate (TESSALON) 100 MG capsule Take 1 capsule (100 mg total) by mouth 3 (three) times daily as needed.  30 capsule  3  . Cholecalciferol (VITAMIN D) 400 UNITS capsule Take 400 Units by mouth daily.        . Diphenhyd-Hydrocort-Nystatin (FIRST-DUKES MOUTHWASH) SUSP Swish and swallow 1 teaspoon four times a day as needed for sore throat  120 mL  0  . esomeprazole (NEXIUM) 40 MG capsule Take 40 mg by mouth as needed.        [name] estrogens, conjugated, (PREMARIN) 0.45 MG tablet Take 0.45 mg by mouth daily. Take daily for 21 days then do not take for 7 days.       . fexofenadine (ALLEGRA) 180 MG tablet Take 180 mg by mouth daily.        . fluticasone (FLOVENT HFA) 110 MCG/ACT inhaler Inhale 2 puffs into the lungs 2 (two) times daily.        [name] guaiFENesin (MUCINEX) 600 MG 12 hr tablet Take 600 mg by mouth 2 (two) times daily as needed.        [name]  montelukast (SINGULAIR) 10 MG tablet TAKE 1 TABLET AT BEDTIME  90 tablet  1  . Multiple Vitamin (MULTIVITAMIN) tablet Take 1 tablet by mouth daily.        . Multiple Vitamins-Calcium (VIACTIV MULTI-VITAMIN) CHEW Chew 1 tablet by mouth 4 (four) times daily.          [name] Pager:  [phone] 08/31/2012, 4:36 PM

## 2012-09-01 ENCOUNTER — Encounter: Payer: Self-pay | Admitting: Internal Medicine

## 2012-09-01 ENCOUNTER — Ambulatory Visit (INDEPENDENT_AMBULATORY_CARE_PROVIDER_SITE_OTHER): Payer: Medicare Other | Admitting: Internal Medicine

## 2012-09-01 VITALS — BP 108/78 | HR 72 | Ht 63.5 in | Wt 92.2 lb

## 2012-09-01 DIAGNOSIS — K219 Gastro-esophageal reflux disease without esophagitis: Secondary | ICD-10-CM

## 2012-09-01 MED ORDER — OMEPRAZOLE 40 MG PO CPDR: 40.0000 mg | DELAYED_RELEASE_CAPSULE | Freq: Two times a day (BID) | ORAL | Status: DC

## 2012-09-01 NOTE — Assessment & Plan Note (Signed)
Slowly improving.  No additional abx needed at present.

## 2012-09-01 NOTE — Progress Notes (Signed)
[name] February 20, 1941 MRN [id number]   History of Present Illness:  This is a 71 year old, white female with chronic gastroesophageal reflux disease and severe COPD. She has been on Nexium 40 mg twice a day with good control. She needs a refill on Nexium. Her co-pay is $200 for a 90 day supply. She is interested in something cheaper. Her last upper endoscopy in March 2009 showed gastritis. A prior endoscopy was in February 2004. She is up-to-date on her colonoscopy which was in March 2009 and it was normal. She has occasional dysphagia and complains of dry mouth and throat. She denies cough. She does not have home oxygen.   Past Medical History  Diagnosis Date  . GERD (gastroesophageal reflux disease)     severe  . BOOP (bronchiolitis obliterans with organizing pneumonia)   . Bronchiectasis   . Raynaud's disease   . Pneumonia   . Esophageal dysmotility   . Gastroparesis    Past Surgical History  Procedure Date  . Cholecystectomy 2005  . Breast lumpectomy     left, x 2  . Total abdominal hysterectomy   . Lung biopsy     vats right 2003    reports that she quit smoking about 43 years ago. Her smoking use included Cigarettes. She has never used smokeless tobacco. She reports that she does not drink alcohol or use illicit drugs. family history includes Cancer in her maternal grandmother; Heart disease in her father and paternal grandfather; Leukemia in her paternal grandmother; Lung cancer in her paternal uncle; and Lymphoma in her mother.  There is no history of Colon cancer. Allergies  Allergen Reactions  . Cortisone   . Sulfonamide Derivatives         Review of Systems: Periodic dysphagia to solids and liquids denies chest pain denies change in bowel habits  The remainder of the 10 point ROS is negative except as outlined in H&P   Physical Exam: General appearance  very thin chronically ill-appearing, in no distress. Eyes- non icteric. HEENT nontraumatic,  normocephalic. Normal voice Mouth no lesions, tongue papillated, no cheilosis. Neck supple without adenopathy, thyroid not enlarged, no carotid bruits, no JVD. Lungs Clear to auscultation bilaterally. No wheezes Cor normal S1, normal S2, regular rhythm, no murmur,  quiet precordium. Abdomen: Soft, nontender. Rectal: Not done Extremities no pedal edema. Skin no lesions. Neurological alert and oriented x 3. Psychological normal mood and affect.  Assessment and Plan:  Problem #1 Chronic gastroesophageal reflux disease. There was no evidence of Barrett's esophagus on her last exam. We will try her on Prilosec 40 mg twice a day in place of Nexium to save for some money. If itt does not control her acid reflux, we will switch back to Nexium. I advised Gaviscon 1-2 tablets after meals and at bedtime for breakthrough heartburn.  Problem #2 Colorectal screening. She is up-to-date on her colonoscopy. Her next exam will be in March 2019.   09/01/2012 [name]

## 2012-09-01 NOTE — Patient Instructions (Addendum)
We have sent the following medications to your pharmacy: Omeprazole 40 mg twice daily,Gaviscon 1-2 tabs after meals and prn CC: Dr Pete Glatter

## 2012-09-01 NOTE — Assessment & Plan Note (Signed)
Stable on current regimen.  She will get her flu shot later this season at her pharmacy.

## 2012-10-15 DIAGNOSIS — J069 Acute upper respiratory infection, unspecified: Secondary | ICD-10-CM

## 2012-10-15 HISTORY — DX: Acute upper respiratory infection, unspecified: J06.9

## 2012-10-16 ENCOUNTER — Ambulatory Visit (INDEPENDENT_AMBULATORY_CARE_PROVIDER_SITE_OTHER): Payer: Medicare Other | Admitting: Adult Health

## 2012-10-16 ENCOUNTER — Encounter: Payer: Self-pay | Admitting: Adult Health

## 2012-10-16 ENCOUNTER — Ambulatory Visit (INDEPENDENT_AMBULATORY_CARE_PROVIDER_SITE_OTHER)
Admission: RE | Admit: 2012-10-16 | Discharge: 2012-10-16 | Disposition: A | Discharge status: Home / Self Care | Payer: Medicare Other | Source: Ambulatory Visit | Attending: Adult Health | Admitting: Adult Health

## 2012-10-16 VITALS — BP 100/68 | HR 68 | Temp 97.4°F | Ht 65.0 in | Wt 96.8 lb

## 2012-10-16 DIAGNOSIS — J479 Bronchiectasis, uncomplicated: Secondary | ICD-10-CM

## 2012-10-16 MED ORDER — LEVOFLOXACIN 500 MG PO TABS: 500.0000 mg | ORAL_TABLET | Freq: Every day | ORAL | Status: DC

## 2012-10-16 NOTE — Assessment & Plan Note (Signed)
Flare  cxr today   Plan  Levaquin 500mg  daily for 10 days  Mucinex DM Twice daily  As needed  Cough/congestion  Fluids and rest  Please contact office for sooner follow up if symptoms do not improve or worsen or seek emergency care   follow up Dr. Craige Cotta  As planned and As needed   I will call with xray results.

## 2012-10-16 NOTE — Progress Notes (Signed)
  Subjective:    Patient ID: [name], female    DOB: Jan 13, 1941, 71 y.o.   MRN: [id number]  HPI 71 yo former smoker with hx of BOOP, BTX, and recurrent aspiration.  10/16/2012 Acute OV  Complains of prod cough with thick gray/yellow mucus, laryngitis, deep chills, body aches, increased SOB, tightness in chest, wheezing.  Coughing up thick yellow mucus w/ grey . Achy in shoulders, pain in ribs with coughing .  Nasal congestion and drainage.  Coughed up blood tinged mucus yesterday x 1 .  Works at [employer].   no otc used        Tests: PFT 12/31/09>>FEV1 1.84(84%), FEV1% 73, TLC 4.94(98%), DLCO 77%, +BD. CT chest 08/17/10>>mild biapical scarring, basilar peribronchovascular nodularity and mild bronchiectasis.   Past Medical History  Diagnosis Date  . GERD (gastroesophageal reflux disease)     severe  . BOOP (bronchiolitis obliterans with organizing pneumonia)   . Bronchiectasis   . Raynaud's disease   . Pneumonia   . Esophageal dysmotility   . Gastroparesis     Past Surgical History  Procedure Date  . Cholecystectomy 2005  . Breast lumpectomy     left  . Total abdominal hysterectomy   . Lung biopsy     vats right 2003    Allergies  Allergen Reactions  . Cortisone   . Sulfonamide Derivatives     Review of Systems Constitutional:   No  weight loss, night sweats,  Fevers,  +chills, fatigue, or  lassitude.  HEENT:   No headaches,  Difficulty swallowing,  Tooth/dental problems, or  Sore throat,                No sneezing, itching, ear ache, + nasal congestion, post nasal drip,   CV:  No chest pain,  Orthopnea, PND, swelling in lower extremities, anasarca, dizziness, palpitations, syncope.   GI  No heartburn, indigestion, abdominal pain, nausea, vomiting, diarrhea, change in bowel habits, loss of appetite, bloody stools.   Resp:   No coughing up of blood.     No chest wall deformity  Skin: no rash or lesions.  GU: no dysuria, change in color of  urine, no urgency or frequency.  No flank pain, no hematuria   MS:  No joint pain or swelling.  No decreased range of motion.   Psych:  No change in mood or affect. No depression or anxiety.  No memory loss.         Objective:   Physical Exam GEN: A/Ox3; pleasant , NAD, thin   HEENT:  [place]/AT,  EACs-clear, TMs-wnl, NOSE-clear drainage , THROAT-clear, no lesions, no postnasal drip or exudate noted.   NECK:  Supple w/ fair ROM; no JVD; normal carotid impulses w/o bruits; no thyromegaly or nodules palpated; no lymphadenopathy.  RESP  Diminished BS in bases w/o, wheezes/ rales/ or rhonchi.no accessory muscle use, no dullness to percussion  CARD:  RRR, no m/r/g  , no peripheral edema, pulses intact, no cyanosis or clubbing.  GI:   Soft & nt; nml bowel sounds; no organomegaly or masses detected.  Musco: Warm bil, no deformities or joint swelling noted.   Neuro: alert, no focal deficits noted.    Skin: Warm, no lesions or rashes         Assessment & Plan:

## 2012-10-16 NOTE — Patient Instructions (Addendum)
Levaquin 500mg  daily for 10 days  Mucinex DM Twice daily  As needed  Cough/congestion  Fluids and rest  Please contact office for sooner follow up if symptoms do not improve or worsen or seek emergency care   follow up Dr. Craige Cotta  As planned and As needed   I will call with xray results.

## 2012-10-17 NOTE — Progress Notes (Signed)
Dg Chest 2 View  10/16/2012  *RADIOLOGY REPORT*  Clinical Data: Chest soreness.  Cough, shortness of breath. History of smoking, pulmonary fibrosis.  Bronchiectasis.  CHEST - 2 VIEW  Comparison: 12/13/2011  Findings: The lungs are hyperinflated.  Heart size is normal.  No pulmonary edema.  Patchy infiltrate is identified at the right lung base.  To a lesser degree, patchy densities also identified at the left lung base.  Findings are likely infectious.  IMPRESSION:  1.  Hyperinflation bronchitic changes. 2.  Bilateral lower lobe infiltrates, right greater than left.   Original Report Authenticated By: Norva Pavlov, M.D.    Reviewed and agree with assessment/plan.

## 2012-10-18 ENCOUNTER — Other Ambulatory Visit: Payer: Self-pay | Admitting: Adult Health

## 2012-10-18 DIAGNOSIS — J189 Pneumonia, unspecified organism: Secondary | ICD-10-CM

## 2012-10-18 NOTE — Progress Notes (Signed)
Quick Note:  Called spoke with patient, advised of cxr results / recs as stated by TP. Pt verbalized her understanding and denied any questions. Ov w/ VS scheduled for 12.23.13 @ 3:15pm, pt to arrive early for STAT cxr > orders only encounter created for STAT cxr order. ______

## 2012-11-06 ENCOUNTER — Ambulatory Visit (INDEPENDENT_AMBULATORY_CARE_PROVIDER_SITE_OTHER): Payer: Medicare Other | Admitting: Pulmonary Disease

## 2012-11-06 ENCOUNTER — Encounter: Payer: Self-pay | Admitting: Pulmonary Disease

## 2012-11-06 ENCOUNTER — Ambulatory Visit (INDEPENDENT_AMBULATORY_CARE_PROVIDER_SITE_OTHER)
Admission: RE | Admit: 2012-11-06 | Discharge: 2012-11-06 | Disposition: A | Discharge status: Home / Self Care | Payer: Medicare Other | Source: Ambulatory Visit | Attending: Pulmonary Disease | Admitting: Pulmonary Disease

## 2012-11-06 VITALS — BP 108/72 | HR 63 | Temp 98.0°F | Ht 65.0 in | Wt 90.8 lb

## 2012-11-06 DIAGNOSIS — Z23 Encounter for immunization: Secondary | ICD-10-CM

## 2012-11-06 DIAGNOSIS — J189 Pneumonia, unspecified organism: Secondary | ICD-10-CM

## 2012-11-06 DIAGNOSIS — J479 Bronchiectasis, uncomplicated: Secondary | ICD-10-CM

## 2012-11-06 NOTE — Patient Instructions (Signed)
Flu shot today Follow up in 6 months 

## 2012-11-06 NOTE — Progress Notes (Signed)
Chief Complaint  Patient presents with  . Follow-up    w/ CXR. feeling better. cough is better- thick and clear. wheezing and chest tx. feels hoarse. no chills or fever    History of Present Illness: [name] is a 71 y.o. female former smoker with hx of BOOP, BTX, and recurrent aspiration.  She was treated for exacerbation of her BTX and right lower lobe pneumonia December 2.  She is feeling better, but still has cough with clear sputum.  She is not wheezing as much.  She denies fever, chest pain, or hemoptysis.  She is now using her albuterol only a few times per week.  She still doesn't have her full energy back yet.   Tests: PFT 12/31/09>>FEV1 1.84(84%), FEV1% 73, TLC 4.94(98%), DLCO 77%, +BD. CT chest 08/17/10>>mild biapical scarring, basilar peribronchovascular nodularity and mild bronchiectasis.   Past Medical History  Diagnosis Date  . GERD (gastroesophageal reflux disease)     severe  . BOOP (bronchiolitis obliterans with organizing pneumonia)   . Bronchiectasis   . Raynaud's disease   . Pneumonia   . Esophageal dysmotility   . Gastroparesis   . Right lower lobe pneumonia December 2013    Past Surgical History  Procedure Date  . Cholecystectomy 2005  . Breast lumpectomy     left, x 2  . Total abdominal hysterectomy   . Lung biopsy     vats right 2003    Current Outpatient Prescriptions on File Prior to Visit  Medication Sig Dispense Refill  . albuterol (VENTOLIN HFA) 108 (90 BASE) MCG/ACT inhaler Inhale 2 puffs into the lungs every 6 (six) hours as needed.        . benzonatate (TESSALON) 100 MG capsule Take 1 capsule (100 mg total) by mouth 3 (three) times daily as needed.  90 capsule  3  . Cholecalciferol (VITAMIN D) 400 UNITS capsule Take 400 Units by mouth daily.        [name] estrogens, conjugated, (PREMARIN) 0.45 MG tablet Take 0.45 mg by mouth daily. Take daily for 21 days then do not take for 7 days.       . fluticasone (FLOVENT HFA) 110 MCG/ACT inhaler  Inhale 2 puffs into the lungs 2 (two) times daily.        [name] guaiFENesin (MUCINEX) 600 MG 12 hr tablet Take 600 mg by mouth 2 (two) times daily as needed.        . montelukast (SINGULAIR) 10 MG tablet TAKE 1 TABLET AT BEDTIME  90 tablet  1  . Multiple Vitamin (MULTIVITAMIN) tablet Take 1 tablet by mouth daily.        . Multiple Vitamins-Calcium (VIACTIV MULTI-VITAMIN) CHEW Chew 1 tablet by mouth 4 (four) times daily.        [name] omeprazole (PRILOSEC) 40 MG capsule Take 1 capsule (40 mg total) by mouth 2 (two) times daily.  180 capsule  2  . Probiotic Product (PROBIOTIC DAILY PO) Take 1 capsule by mouth daily.        Allergies  Allergen Reactions  . Cortisone   . Sulfonamide Derivatives     Physical Exam:  Filed Vitals:   11/06/12 1532 11/06/12 1535  BP:  108/72  Pulse:  63  Temp: 98 F (36.7 C)   TempSrc: Oral   Height: 5\' 5"  (1.651 m)   Weight: 90 lb 12.8 oz (41.187 kg)   SpO2:  98%    Body mass index is 15.11 kg/(m^2).   Wt Readings from Last  2 Encounters:  11/06/12 90 lb 12.8 oz (41.187 kg)  10/16/12 96 lb 12.8 oz (43.908 kg)    General - thin, no distress HEENT - no sinus tenderness, no oral exudate, no LAN Cardiac - s1s2 regular, no murmur Chest - prolonged exhalation, normal respiratory excursion, barrel chest, no wheeze/rales Abdomen - soft, nontender Extremities - no edema Skin - no rashes Neurologic - normal strength Psychiatric - normal mood, behavior   Dg Chest 2 View  11/06/2012  *RADIOLOGY REPORT*  Clinical Data: Follow up pneumonia  CHEST - 2 VIEW  Comparison: 10/16/2012  Findings: Right lower lobe pneumonia has resolved.  COPD with hyperinflation and changes of emphysema.  Lungs are clear without infiltrate or effusion.  Negative for heart failure.  IMPRESSION: Interval clearing of right lower lobe pneumonia.  Advanced COPD.   Original Report Authenticated By: Janeece Riggers, M.D.     Assessment/Plan:  Elizabeth Helling, MD [facility] Pulmonary/Critical  Care 11/06/2012, 3:50 PM Pager:  [phone] After 3pm call: [phone]

## 2012-11-06 NOTE — Assessment & Plan Note (Signed)
Stable on current regimen   

## 2012-11-06 NOTE — Assessment & Plan Note (Signed)
Improved clinically and radiographically.  Okay for her to get flu shot today.

## 2013-01-22 ENCOUNTER — Other Ambulatory Visit: Payer: Self-pay | Admitting: Pulmonary Disease

## 2013-03-01 ENCOUNTER — Telehealth: Payer: Self-pay | Admitting: Internal Medicine

## 2013-03-01 MED ORDER — OMEPRAZOLE 40 MG PO CPDR: 40.0000 mg | DELAYED_RELEASE_CAPSULE | Freq: Two times a day (BID) | ORAL | Status: DC

## 2013-03-01 NOTE — Telephone Encounter (Signed)
Patient needed a prescription refill sent in for omeprazole. Rx sent. A prior authorization will be attempted if I get any requests from patient's pharmacy. Patient advised and verbalizes understanding.

## 2013-03-12 ENCOUNTER — Telehealth: Payer: Self-pay | Admitting: Internal Medicine

## 2013-03-12 NOTE — Telephone Encounter (Signed)
Left message for patient to call back  

## 2013-03-12 NOTE — Telephone Encounter (Signed)
Patient says she got a letter from [facility] denying her prescription for Omeprazole.  She says the letter says Prilosec and she is not sure if this is the same thing.

## 2013-03-12 NOTE — Telephone Encounter (Signed)
Patient states that she was given a denial on omeprazole. I have contacted Bonna Gains. @ [facility] [phone]). Insurance has approved omeprazole (dx: GERD... Has tried Nexium twice daily before). Case # is [id number]. I have also contacted Aggie Cosier at [facility] pharmacy to ask that prescription be attempted again. She states that the patient just needs to call to ask that the rx be sent to her address but everything looks like rx should go through with no problem. Patient verbalizes understanding.

## 2013-03-28 ENCOUNTER — Encounter: Payer: Self-pay | Admitting: Pulmonary Disease

## 2013-04-05 ENCOUNTER — Ambulatory Visit (INDEPENDENT_AMBULATORY_CARE_PROVIDER_SITE_OTHER): Payer: Medicare Other | Admitting: Internal Medicine

## 2013-04-05 ENCOUNTER — Encounter: Payer: Self-pay | Admitting: Internal Medicine

## 2013-04-05 VITALS — BP 94/62 | HR 75 | Temp 98.0°F | Ht 63.0 in | Wt 95.4 lb

## 2013-04-05 DIAGNOSIS — J479 Bronchiectasis, uncomplicated: Secondary | ICD-10-CM

## 2013-04-05 MED ORDER — LEVOFLOXACIN 750 MG PO TABS: 750.0000 mg | ORAL_TABLET | Freq: Every day | ORAL | Status: DC

## 2013-04-05 NOTE — Patient Instructions (Addendum)
Levaquin 750 mg one daily x 5 days  Ok to leave off flovent  For cough ok to use tessalon but if not  Effective mucinex dm up to 1200 mg every 24  Only use your albuterol as a rescue medication to be used if you can't catch your breath by resting or doing a relaxed purse lip breathing pattern. The less you use it, the better it will work when you need it.  Ok to use it up to 4 hours  Regroup with Dr Craige Cotta at your next scheduled appt

## 2013-04-05 NOTE — Progress Notes (Signed)
Chief Complaint  Patient presents with  . Acute Visit    Pt c/o increased cough, chest congestion and rhinitis x 2 wks. She has also noticed some facial pressure/HA.  Her cough is prod with large amounts of yellow to grey mucus.     History of Present Illness: [name] is a 72 y.o. female former smoker with hx of BOOP, BTX, and recurrent aspiration.  She was treated for exacerbation of her BTX and right lower lobe pneumonia October 16 2012.  She is feeling better, but still has cough with clear sputum.  She is not wheezing as much.  She denies fever, chest pain, or hemoptysis.  She is now using her albuterol only a few times per week.  She still doesn't have her full energy back yet.  Levaquin 500mg  daily for 10 days  Mucinex DM Twice daily  As needed  Cough/congestion  Fluids and rest   04/05/2013 acute w/in  ov/Masaichi Kracht was better x months so stopped flovent Chief Complaint  Patient presents with  . Acute Visit    Pt c/o increased cough, chest congestion and rhinitis x 2 wks. She has also noticed some facial pressure/HA.  Her cough is prod with large amounts of yellow to grey mucus.     No obvious daytime variabilty or assoc sob or cp or chest tightness, subjective wheeze overt  hb symptoms. No unusual exp hx or h/o childhood pna/ asthma or premature birth to her knowledge.   Sleeping ok without nocturnal  or early am exacerbation  of respiratory  c/o's or need for noct saba. Also denies any obvious fluctuation of symptoms with weather or environmental changes or other aggravating or alleviating factors except as outlined above   Current Medications, Allergies, Past Medical History, Past Surgical History, Family History, and Social History were reviewed in [employer] record.  ROS  The following are not active complaints unless bolded sore throat, dysphagia, dental problems, itching, sneezing,  nasal congestion or excess/ purulent secretions, ear ache,   fever,  chills, sweats, unintended wt loss, pleuritic or exertional cp, hemoptysis,  orthopnea pnd or leg swelling, presyncope, palpitations, heartburn, abdominal pain, anorexia, nausea, vomiting, diarrhea  or change in bowel or urinary habits, change in stools or urine, dysuria,hematuria,  rash, arthralgias, visual complaints, headache, numbness weakness or ataxia or problems with walking or coordination,  change in mood/affect or memory.         Tests: PFT 12/31/09>>FEV1 1.84(84%), FEV1% 73, TLC 4.94(98%), DLCO 77%, +BD. CT chest 08/17/10>>mild biapical scarring, basilar peribronchovascular nodularity and mild bronchiectasis.   Past Medical History  Diagnosis Date  . GERD (gastroesophageal reflux disease)     severe  . BOOP (bronchiolitis obliterans with organizing pneumonia)   . Bronchiectasis   . Raynaud's disease   . Pneumonia   . Esophageal dysmotility   . Gastroparesis   . Right lower lobe pneumonia December 2013    Past Surgical History  Procedure Laterality Date  . Cholecystectomy  2005  . Breast lumpectomy      left, x 2  . Total abdominal hysterectomy    . Lung biopsy      vats right 2003    Current Outpatient Prescriptions on File Prior to Visit  Medication Sig Dispense Refill  . albuterol (VENTOLIN HFA) 108 (90 BASE) MCG/ACT inhaler Inhale 2 puffs into the lungs every 6 (six) hours as needed.        . benzonatate (TESSALON) 100 MG capsule Take 1 capsule (  100 mg total) by mouth 3 (three) times daily as needed.  90 capsule  3  . Cholecalciferol (VITAMIN D) 400 UNITS capsule Take 400 Units by mouth daily.        [name] estrogens, conjugated, (PREMARIN) 0.45 MG tablet Take 0.45 mg by mouth daily. Take daily for 21 days then do not take for 7 days.       . fexofenadine (ALLEGRA) 180 MG tablet Take 180 mg by mouth daily.      . fluticasone (FLOVENT HFA) 110 MCG/ACT inhaler Inhale 2 puffs into the lungs 2 (two) times daily as needed.       [name] guaiFENesin (MUCINEX) 600 MG 12 hr  tablet Take 600 mg by mouth 2 (two) times daily as needed.        . montelukast (SINGULAIR) 10 MG tablet TAKE 1 TABLET AT BEDTIME  90 tablet  0  . Multiple Vitamin (MULTIVITAMIN) tablet Take 1 tablet by mouth daily.        . Multiple Vitamins-Calcium (VIACTIV MULTI-VITAMIN) CHEW Chew 1 tablet by mouth 4 (four) times daily.        [name] omeprazole (PRILOSEC) 40 MG capsule Take 1 capsule (40 mg total) by mouth 2 (two) times daily.  180 capsule  1  . Probiotic Product (PROBIOTIC DAILY PO) Take 1 capsule by mouth daily.       No current facility-administered medications on file prior to visit.    Allergies  Allergen Reactions  . Cortisone   . Sulfonamide Derivatives     Physical Exam:  Filed Vitals:   04/05/13 1401  BP: 94/62  Pulse: 75  Temp: 98 F (36.7 C)  TempSrc: Oral  Height: 5\' 3"  (1.6 m)  Weight: 95 lb 6.4 oz (43.273 kg)  SpO2: 98%    Body mass index is 16.9 kg/(m^2).   Wt Readings from Last 2 Encounters:  04/05/13 95 lb 6.4 oz (43.273 kg)  11/06/12 90 lb 12.8 oz (41.187 kg)    General - thin, no distress HEENT - no sinus tenderness, no oral exudate,  Moderate nonspecific turbinate edema Cardiac - s1s2 regular, no murmur Chest - prolonged exhalation, normal respiratory excursion, barrel chest, no wheeze/rales Abdomen - soft, nontender Extremities - no edema Skin - no rashes Neurologic - normal strength Psychiatric - normal mood, behavior   No results found.  Assessment/Plan:

## 2013-04-08 NOTE — Assessment & Plan Note (Signed)
Flare in setting of poorly controlled rhinitis ? Underlying sinusitis> not compliant with flovent chronically and doesn't really understand how to use albuterol correctly  The proper method of use, as well as anticipated side effects, of a metered-dose inhaler are discussed and demonstrated to the patient. Improved effectiveness after extensive coaching during this visit to a level of approximately  75% from baseline of < 25%  Will treat her acute exac with levaquin and ask her to return to Dr Craige Cotta to look at longterm treatment options/contingencies    Each maintenance medication was reviewed in detail including most importantly the difference between maintenance and as needed and under what circumstances the prns are to be used.  Please see instructions for details which were reviewed in writing and the patient given a copy.

## 2013-05-14 ENCOUNTER — Encounter: Payer: Self-pay | Admitting: Pulmonary Disease

## 2013-05-14 ENCOUNTER — Ambulatory Visit (INDEPENDENT_AMBULATORY_CARE_PROVIDER_SITE_OTHER): Payer: Medicare Other | Admitting: Pulmonary Disease

## 2013-05-14 VITALS — BP 110/70 | HR 65 | Temp 98.0°F | Ht 64.0 in | Wt 95.8 lb

## 2013-05-14 DIAGNOSIS — J479 Bronchiectasis, uncomplicated: Secondary | ICD-10-CM

## 2013-05-14 DIAGNOSIS — J309 Allergic rhinitis, unspecified: Secondary | ICD-10-CM

## 2013-05-14 NOTE — Progress Notes (Signed)
Chief Complaint  Patient presents with  . Follow-up    pt states her breathing has been "okay". She has a dry cough and occasionally has wheezing/chest tx. She sattes she loses her voice often.     History of Present Illness: [name] is a 72 y.o. female former smoker with bronchiectasis, and recurrent aspiration.  She has prior history of cryptogenic organizing pneumonia.   She was treated with levaquin last month.  This helped, and she is feeling better.  She still gets cough, but mostly in the morning.  She gets sinus congestion and post-nasal drip.  She gets hoarse is she talks a lot.  She denies wheeze, fever, or chest pain.  She has not needed to use albuterol much for the past few weeks.   Tests: PFT 12/31/09>>FEV1 1.84(84%), FEV1% 73, TLC 4.94(98%), DLCO 77%, +BD. CT chest 08/17/10>>mild biapical scarring, basilar peribronchovascular nodularity and mild bronchiectasis.  She  has a past medical history of GERD (gastroesophageal reflux disease); BOOP (bronchiolitis obliterans with organizing pneumonia); Bronchiectasis; Raynaud's disease; Pneumonia; Esophageal dysmotility; Gastroparesis; and Right lower lobe pneumonia (December 2013).  She  has past surgical history that includes Cholecystectomy (2005); Breast lumpectomy; Total abdominal hysterectomy; and Lung biopsy.   Current Outpatient Prescriptions on File Prior to Visit  Medication Sig Dispense Refill  . albuterol (VENTOLIN HFA) 108 (90 BASE) MCG/ACT inhaler Inhale 2 puffs into the lungs every 6 (six) hours as needed.        . benzonatate (TESSALON) 100 MG capsule Take 1 capsule (100 mg total) by mouth 3 (three) times daily as needed.  90 capsule  3  . Cholecalciferol (VITAMIN D) 400 UNITS capsule Take 400 Units by mouth daily.        [name] estrogens, conjugated, (PREMARIN) 0.45 MG tablet Take 0.45 mg by mouth daily. Take daily for 21 days then do not take for 7 days.       . fexofenadine (ALLEGRA) 180 MG tablet Take 180 mg by  mouth daily.      [name] guaiFENesin (MUCINEX) 600 MG 12 hr tablet Take 600 mg by mouth 2 (two) times daily as needed.        . montelukast (SINGULAIR) 10 MG tablet TAKE 1 TABLET AT BEDTIME  90 tablet  0  . Multiple Vitamin (MULTIVITAMIN) tablet Take 1 tablet by mouth daily.        . Multiple Vitamins-Calcium (VIACTIV MULTI-VITAMIN) CHEW Chew 1 tablet by mouth 4 (four) times daily.        [name] omeprazole (PRILOSEC) 40 MG capsule Take 1 capsule (40 mg total) by mouth 2 (two) times daily.  180 capsule  1  . Probiotic Product (PROBIOTIC DAILY PO) Take 1 capsule by mouth daily.       No current facility-administered medications on file prior to visit.    Allergies  Allergen Reactions  . Cortisone   . Sulfonamide Derivatives     Physical Exam:  General - thin, no distress HEENT - no sinus tenderness, no oral exudate, no LAN Cardiac - s1s2 regular, no murmur Chest - prolonged exhalation, normal respiratory excursion, barrel chest, no wheeze/rales Abdomen - soft, nontender Extremities - no edema Skin - no rashes Neurologic - normal strength Psychiatric - normal mood, behavior  Assessment/Plan:  [name], MD [facility] Pulmonary/Critical Care 05/14/2013, 4:08 PM Pager:  [phone] After 3pm call: [phone]

## 2013-05-14 NOTE — Assessment & Plan Note (Signed)
Stable on prn albuterol.  

## 2013-05-14 NOTE — Assessment & Plan Note (Signed)
Stable on allegra, singulair, and prn mucinex.  Explained how her cough could be related to post-nasal drip.  She is reluctant to use nasal sprays.

## 2013-05-14 NOTE — Patient Instructions (Signed)
Follow up in 6 months 

## 2013-06-06 ENCOUNTER — Other Ambulatory Visit: Payer: Self-pay | Admitting: Pulmonary Disease

## 2013-06-06 ENCOUNTER — Other Ambulatory Visit: Payer: Self-pay | Admitting: Internal Medicine

## 2013-07-09 ENCOUNTER — Telehealth: Payer: Self-pay | Admitting: Internal Medicine

## 2013-07-09 MED ORDER — OMEPRAZOLE 40 MG PO CPDR: 40.0000 mg | DELAYED_RELEASE_CAPSULE | Freq: Two times a day (BID) | ORAL | Status: DC

## 2013-07-09 NOTE — Telephone Encounter (Signed)
rx sent

## 2013-09-15 ENCOUNTER — Other Ambulatory Visit: Payer: Self-pay | Admitting: Internal Medicine

## 2013-10-01 ENCOUNTER — Ambulatory Visit (INDEPENDENT_AMBULATORY_CARE_PROVIDER_SITE_OTHER): Payer: Medicare Other | Admitting: Pulmonary Disease

## 2013-10-01 ENCOUNTER — Encounter: Payer: Self-pay | Admitting: Pulmonary Disease

## 2013-10-01 VITALS — BP 118/78 | HR 63 | Temp 97.3°F | Ht 65.0 in | Wt 98.2 lb

## 2013-10-01 DIAGNOSIS — J471 Bronchiectasis with (acute) exacerbation: Secondary | ICD-10-CM

## 2013-10-01 MED ORDER — LEVOFLOXACIN 750 MG PO TABS: 750.0000 mg | ORAL_TABLET | Freq: Every day | ORAL | Status: DC

## 2013-10-01 MED ORDER — BENZONATATE 100 MG PO CAPS: 100.0000 mg | ORAL_CAPSULE | Freq: Three times a day (TID) | ORAL | Status: DC | PRN

## 2013-10-01 NOTE — Progress Notes (Signed)
  Subjective:    Patient ID: [name], female    DOB: April 28, 1941, 72 y.o.   MRN: [id number]  HPI The patient comes in today for an acute sick visit.  She has known bronchiectasis secondary to recurrent aspiration from esophageal dysmotility.  She gives a few day history of worsening congestion, as well as a cough with purulent mucus.  This is different than her usual baseline.  She denies any objective fever, but does have subjective symptoms.  She does not have worsening shortness of breath with this.   Review of Systems  Constitutional: Positive for fever ( reports feeling feverish), chills, diaphoresis and fatigue. Negative for unexpected weight change.  HENT: Positive for congestion, ear pain, rhinorrhea and sore throat. Negative for dental problem, nosebleeds, postnasal drip, sinus pressure, sneezing and trouble swallowing.   Eyes: Negative for redness and itching.  Respiratory: Positive for cough, chest tightness, shortness of breath and wheezing.   Cardiovascular: Positive for chest pain. Negative for palpitations and leg swelling.  Gastrointestinal: Negative for nausea and vomiting.  Genitourinary: Negative for dysuria.  Musculoskeletal: Negative for joint swelling.  Skin: Negative for rash.  Neurological: Negative for headaches.  Hematological: Does not bruise/bleed easily.  Psychiatric/Behavioral: Negative for dysphoric mood. The patient is not nervous/anxious.        Objective:   Physical Exam Thin female in no acute distress Nose without purulence or discharge noted Oropharynx clear TMs intact with no erythema, canals clear Neck without lymphadenopathy or thyromegaly Chest with mild decrease in breath sounds, no active wheezing or crackles Heart exam with regular rate and rhythm Lower extremities with mild ankle edema right greater than left, no cyanosis Alert and oriented, moves all 4 extremities.       Assessment & Plan:

## 2013-10-01 NOTE — Patient Instructions (Signed)
Will treat with levaquin 750mg  one each day for 7 days.  Can use mucinex one am and pm to help with thinning of mucus.  Drink large glass of water with each dose.  Please call if you are not improving.

## 2013-10-01 NOTE — Assessment & Plan Note (Signed)
The patient's history is most consistent with an acute exacerbation of her bronchiectasis.  She is having purulent mucus above her usual baseline, as well as increased chest congestion.  We'll treat her with a course of antibiotics, and I am also suggested a mucolytic to help with secretion clearance.  She is to call us if she is not improving.

## 2013-10-09 ENCOUNTER — Ambulatory Visit (INDEPENDENT_AMBULATORY_CARE_PROVIDER_SITE_OTHER): Payer: Medicare Other | Admitting: Family Medicine

## 2013-10-09 ENCOUNTER — Encounter: Payer: Self-pay | Admitting: Family Medicine

## 2013-10-09 VITALS — BP 110/76 | HR 61 | Wt 91.0 lb

## 2013-10-09 DIAGNOSIS — M19079 Primary osteoarthritis, unspecified ankle and foot: Secondary | ICD-10-CM

## 2013-10-09 DIAGNOSIS — M19071 Primary osteoarthritis, right ankle and foot: Secondary | ICD-10-CM | POA: Insufficient documentation

## 2013-10-09 DIAGNOSIS — M216X1 Other acquired deformities of right foot: Secondary | ICD-10-CM

## 2013-10-09 DIAGNOSIS — M216X9 Other acquired deformities of unspecified foot: Secondary | ICD-10-CM | POA: Insufficient documentation

## 2013-10-09 DIAGNOSIS — Q667 Congenital pes cavus, unspecified foot: Secondary | ICD-10-CM | POA: Insufficient documentation

## 2013-10-09 NOTE — Patient Instructions (Signed)
Very nice to meet you You have arthritis.  Take tylenol 650 mg three times a day is the best evidence based medicine we have for arthritis.  Glucosamine sulfate 750mg  twice a day is a supplement that has been shown to help moderate to severe arthritis. Vitamin D 5000 IU daily Fish oil 2 grams daily.  Tumeric 500mg  twice daily.  Tart cherry extract could help not a lot of medical evidence.  Capsaicin topically up to four times a day may also help with pain. Try orthotics, Spenco sports insoles you can get at [employer].  Exercises on sheet and [name] will probably have more for you.  Cortisone injections are an option if these interventions do not seem to make a difference or need more relief.  If cortisone injections do not help, there are different types of shots that may help but they take longer to take effect.  We can discuss this at follow up.  It's important that you continue to stay active.  Water aerobics and cycling with low resistance are the best two types of exercise for arthritis. Come back and see me in 3 weeks.

## 2013-10-09 NOTE — Progress Notes (Signed)
Pre-visit discussion using our clinic review tool. No additional management support is needed unless otherwise documented below in the visit note.  

## 2013-10-09 NOTE — Progress Notes (Signed)
  I'm seeing this patient by the request  of:  Referred by Denice Paradise DPT.   CC: Numbness in right foot and swelling  HPI: Patient is a 72 year old female coming in with numbness the right foot and swelling. Patient states that for multiple years she had sciatic pain secondary to endometriosis. Patient states that she has had numbness between the first and second toe for quite some time. Patient also has some mild numbness on the plantar aspect of the foot. In addition to that patient noticed over the course the last several weeks she started having some swelling at the end of the long days of the right ankle. This is starting to affect her with walking which is what she loves to do. Patient has tried to change her sports and activities to more biking which seems to be helpful. Patient would like to avoid any prescription medications when possible. Patient rates the discomfort as 4/10 on the severity scale.  Past medical, surgical, family and social history reviewed. Medications reviewed all in the electronic medical record.   Review of Systems: No headache, visual changes, nausea, vomiting, diarrhea, constipation, dizziness, abdominal pain, skin rash, fevers, chills, night sweats, weight loss, swollen lymph nodes, body aches, joint swelling, muscle aches, chest pain, shortness of breath, mood changes.   Objective:    Blood pressure 110/76, pulse 61, weight 91 lb (41.277 kg), SpO2 94.00%.   General: No apparent distress alert and oriented x3 mood and affect normal, dressed appropriately.  HEENT: Pupils equal, extraocular movements intact Respiratory: Patient's speak in full sentences and does not appear short of breath Cardiovascular: No lower extremity edema, non tender, no erythema Skin: Warm dry intact with no signs of infection or rash on extremities or on axial skeleton. Abdomen: Soft nontender Neuro: Cranial nerves II through XII are intact, neurovascularly intact in all extremities  with 2+ DTRs and 2+ pulses. Lymph: No lymphadenopathy of posterior or anterior cervical chain or axillae bilaterally.  Gait normal with good balance and coordination.  MSK: Non tender with full range of motion and good stability and symmetric strength and tone of shoulders, elbows, wrist, hip, knees bilaterally.  Ankle:right  No visible erythema trace swelling. Range of motion is somewhat limited in dorsiflexion compared to the contralateral side Strength is 5/5 in all directions. Stable lateral and medial ligaments; squeeze test and kleiger test unremarkable; Talar dome nontender; No pain at base of 5th MT; No tenderness over cuboid; No tenderness over N spot or navicular prominence No tenderness on posterior aspects of lateral and medial malleolus No sign of peroneal tendon subluxations or tenderness to palpation Negative tarsal tunnel tinel's Able to walk 4 steps. Foot exam: Patient does have bunions on the large toe with mild fibular deviation bilaterally. Patient also aspect of the transverse arch bilaterally. Patient does have a cavus foot.    Impression and Recommendations:     This case required medical decision making of moderate complexity.

## 2013-10-09 NOTE — Assessment & Plan Note (Signed)
Patient does have right ankle arthritis that is likely an underlying problem. I think this is somewhat advanced secondary to nerve injury previously stated with her sciatica. Discussed over-the-counter orthotics he can be beneficial and was given home exercise program including theraband.  Patient also told about different over-the-counter medication taken be beneficial. If patient continues to have pain we like her to come back again and we will do it is custom orthotics. Patient will followup in 3 weeks.

## 2013-10-18 ENCOUNTER — Ambulatory Visit (INDEPENDENT_AMBULATORY_CARE_PROVIDER_SITE_OTHER): Payer: Medicare Other | Admitting: Pulmonary Disease

## 2013-10-18 ENCOUNTER — Telehealth: Payer: Self-pay | Admitting: Pulmonary Disease

## 2013-10-18 ENCOUNTER — Encounter: Payer: Self-pay | Admitting: Pulmonary Disease

## 2013-10-18 VITALS — BP 110/70 | HR 60 | Ht 62.0 in | Wt 91.0 lb

## 2013-10-18 DIAGNOSIS — J309 Allergic rhinitis, unspecified: Secondary | ICD-10-CM

## 2013-10-18 DIAGNOSIS — J479 Bronchiectasis, uncomplicated: Secondary | ICD-10-CM

## 2013-10-18 MED ORDER — BENZONATATE 100 MG PO CAPS: 100.0000 mg | ORAL_CAPSULE | Freq: Three times a day (TID) | ORAL | Status: DC | PRN

## 2013-10-18 NOTE — Assessment & Plan Note (Signed)
Stable on allerga, singulair, and prn mucinex.

## 2013-10-18 NOTE — Assessment & Plan Note (Signed)
Stable at present.  She has needed multiple course of antibiotics over the past year.  If she has another flare in the near future, then should consider checking sputum culture for culture/AFB/fungal culture.  She is to continue singulair and prn albuterol.  She got her flu shot today.  She will need booster for Pneumococcal 23 in December 2015.

## 2013-10-18 NOTE — Telephone Encounter (Signed)
Okay to double book visit for either December or January.

## 2013-10-18 NOTE — Patient Instructions (Signed)
Flu shot today Follow up in 6 months 

## 2013-10-18 NOTE — Progress Notes (Signed)
Chief Complaint  Patient presents with  . Follow-up    Breathing has been doing well. Reports being sick a while back. Denies SOB, chest tightness or coughing.    History of Present Illness: [name] is a 72 y.o. female former smoker with bronchiectasis, and recurrent aspiration.  She has prior history of cryptogenic organizing pneumonia.  She was seen in November for an exacerbation treated with levaquin.  Previous exacerbation was in May.  She was treated for pneumonia in December 2013, and sinus infection in September 2013.  She has notice more trouble with reflux, and is due to see Dr. Juanda Chance.  Her breathing otherwise is better.  She gets congestion, but is not bringing up sputum.  She denies fever or wheeze.  She is not using albuterol.  Her sinuses are okay.  Tests: PFT 12/31/09 >> FEV1 1.84(84%), FEV1% 73, TLC 4.94(98%), DLCO 77%, +BD. CT chest 08/17/10 >> mild biapical scarring, basilar peribronchovascular nodularity and mild bronchiectasis.  She  has a past medical history of GERD (gastroesophageal reflux disease); BOOP (bronchiolitis obliterans with organizing pneumonia); Bronchiectasis; Raynaud's disease; Pneumonia; Esophageal dysmotility; Gastroparesis; and Right lower lobe pneumonia (December 2013).  She  has past surgical history that includes Cholecystectomy (2005); Breast lumpectomy; Total abdominal hysterectomy; and Lung biopsy.   Current Outpatient Prescriptions on File Prior to Visit  Medication Sig Dispense Refill  . albuterol (VENTOLIN HFA) 108 (90 BASE) MCG/ACT inhaler Inhale 2 puffs into the lungs every 6 (six) hours as needed.        . Cholecalciferol (VITAMIN D) 400 UNITS capsule Take 400 Units by mouth daily.        [name] estrogens, conjugated, (PREMARIN) 0.45 MG tablet Take 0.45 mg by mouth daily. Take daily for 21 days then do not take for 7 days.       . fexofenadine (ALLEGRA) 180 MG tablet Take 180 mg by mouth daily.      [name] guaiFENesin (MUCINEX) 600 MG 12  hr tablet Take 600 mg by mouth 2 (two) times daily as needed.        . montelukast (SINGULAIR) 10 MG tablet TAKE 1 TABLET AT BEDTIME  90 tablet  1  . Multiple Vitamin (MULTIVITAMIN) tablet Take 1 tablet by mouth daily.        . Multiple Vitamins-Calcium (VIACTIV MULTI-VITAMIN) CHEW Chew 1 tablet by mouth 4 (four) times daily.        [name] omeprazole (PRILOSEC) 40 MG capsule TAKE 1 CAPSULE TWICE A DAY  180 capsule  1  . Probiotic Product (PROBIOTIC DAILY PO) Take 1 capsule by mouth daily.       No current facility-administered medications on file prior to visit.    Allergies  Allergen Reactions  . Cortisone   . Sulfonamide Derivatives     Physical Exam:  General - thin, no distress HEENT - no sinus tenderness, no oral exudate, no LAN Cardiac - s1s2 regular, no murmur Chest - prolonged exhalation, normal respiratory excursion, barrel chest, no wheeze/rales Abdomen - soft, nontender Extremities - no edema Skin - no rashes Neurologic - normal strength Psychiatric - normal mood, behavior  Assessment/Plan:  [name], MD [facility] Pulmonary/Critical Care 10/18/2013, 2:48 PM Pager:  [phone] After 3pm call: [phone]

## 2013-10-18 NOTE — Telephone Encounter (Signed)
I called and spoke with pt. She was suppose to f/u with VS this month. Nothing available. She wants to be worked in the Jan schedule. She does not want to wait until feb to be seen. Please advise Dr. Craige Cotta thanks

## 2013-10-18 NOTE — Telephone Encounter (Signed)
VS had an opening this afternoon. She is scheduled to come in at that time

## 2013-10-26 ENCOUNTER — Ambulatory Visit: Payer: Medicare Other | Admitting: Family Medicine

## 2013-10-29 ENCOUNTER — Encounter: Payer: Self-pay | Admitting: Family Medicine

## 2013-10-29 ENCOUNTER — Ambulatory Visit (INDEPENDENT_AMBULATORY_CARE_PROVIDER_SITE_OTHER): Payer: Medicare Other | Admitting: Family Medicine

## 2013-10-29 VITALS — BP 110/64 | HR 58

## 2013-10-29 DIAGNOSIS — Q667 Congenital pes cavus, unspecified foot: Secondary | ICD-10-CM

## 2013-10-29 NOTE — Patient Instructions (Signed)
Good to see you Pick up orthotics on Thursday after 1 pm.  Wear only with orthotics and take other insoles out Only wear for 4 hours first day then increase by hour daily.  Come back 1 month after you pick them up.

## 2013-10-29 NOTE — Assessment & Plan Note (Signed)
Orthotics made today, discuss proper wearing and increasing slowly.  Come back in 4 weeks for further exam.  Would consider back xray of foot and back and rule out nueroma in differential

## 2013-10-29 NOTE — Progress Notes (Signed)
Pre-visit discussion using our clinic review tool. No additional management support is needed unless otherwise documented below in the visit note.  

## 2013-10-29 NOTE — Progress Notes (Signed)
    CC: Numbness in right foot and swelling  HPI: Patient is a 72 year old female coming in with numbness the right foot and swelling. Patient states that for multiple years she had sciatic pain secondary to endometriosis. Patient states that she has had numbness between the first and second toe for quite some time. Patient did get over-the-counter orthotics which she states has been beneficial. Patient states from time to time she still has unfortunately weakness she thinks of this lower leg. Patient states that the long day her feet seems very very tired.  Past medical, surgical, family and social history reviewed. Medications reviewed all in the electronic medical record.   Review of Systems: No headache, visual changes, nausea, vomiting, diarrhea, constipation, dizziness, abdominal pain, skin rash, fevers, chills, night sweats, weight loss, swollen lymph nodes, body aches, joint swelling, muscle aches, chest pain, shortness of breath, mood changes.   Objective:    Blood pressure 110/64, pulse 58, SpO2 99.00%.   General: No apparent distress alert and oriented x3 mood and affect normal, dressed appropriately.  HEENT: Pupils equal, extraocular movements intact Respiratory: Patient's speak in full sentences and does not appear short of breath Cardiovascular: No lower extremity edema, non tender, no erythema Skin: Warm dry intact with no signs of infection or rash on extremities or on axial skeleton. Abdomen: Soft nontender Neuro: Cranial nerves II through XII are intact, neurovascularly intact in all extremities with 2+ DTRs and 2+ pulses. Lymph: No lymphadenopathy of posterior or anterior cervical chain or axillae bilaterally.  Gait normal with good balance and coordination.  MSK: Non tender with full range of motion and good stability and symmetric strength and tone of shoulders, elbows, wrist, hip, knees bilaterally.  Ankle:right  No visible erythema trace swelling. Range of motion is  somewhat limited in dorsiflexion compared to the contralateral side Strength is 5/5 in all directions. Stable lateral and medial ligaments; squeeze test and kleiger test unremarkable; Talar dome nontender; No pain at base of 5th MT; No tenderness over cuboid; No tenderness over N spot or navicular prominence No tenderness on posterior aspects of lateral and medial malleolus No sign of peroneal tendon subluxations or tenderness to palpation Negative tarsal tunnel tinel's Able to walk 4 steps. Foot exam: Patient does have bunions on the large toe with mild fibular deviation bilaterally. Patient also aspect of the transverse arch bilaterally. Patient does have a cavus foot.   Patient was fitted for a : standard, cushioned, semi-rigid orthotic. The orthotic was heated and afterward the patient stood on the orthotic blank positioned on the orthotic stand. The patient was positioned in subtalar neutral position and 10 degrees of ankle dorsiflexion in a weight bearing stance. After completion of molding, a stable base was applied to the orthotic blank. The blank was ground to a stable position for weight bearing. Size:9 Base: leather black Additional Posting and Padding: None The patient ambulated these, and they were very comfortable.  I spent 45 minutes with this patient, greater than 50% was face-to-face time counseling regarding the below diagnosis.   Impression and Recommendations:

## 2013-10-30 ENCOUNTER — Other Ambulatory Visit (INDEPENDENT_AMBULATORY_CARE_PROVIDER_SITE_OTHER): Payer: Medicare Other

## 2013-10-30 ENCOUNTER — Encounter: Payer: Self-pay | Admitting: Internal Medicine

## 2013-10-30 ENCOUNTER — Ambulatory Visit (INDEPENDENT_AMBULATORY_CARE_PROVIDER_SITE_OTHER): Payer: Medicare Other | Admitting: Internal Medicine

## 2013-10-30 VITALS — BP 100/60 | HR 60 | Ht 63.0 in | Wt 91.2 lb

## 2013-10-30 DIAGNOSIS — D649 Anemia, unspecified: Secondary | ICD-10-CM

## 2013-10-30 DIAGNOSIS — D509 Iron deficiency anemia, unspecified: Secondary | ICD-10-CM

## 2013-10-30 LAB — VITAMIN B12: Vitamin B-12: 442 pg/mL (ref 211–911)

## 2013-10-30 LAB — CBC WITH DIFFERENTIAL/PLATELET
Basophils Absolute: 0 10*3/uL (ref 0.0–0.1)
Eosinophils Relative: 0.6 % (ref 0.0–5.0)
HCT: 39.7 % (ref 36.0–46.0)
Hemoglobin: 12.7 g/dL (ref 12.0–15.0)
Lymphocytes Relative: 15.5 % (ref 12.0–46.0)
Lymphs Abs: 1.5 10*3/uL (ref 0.7–4.0)
Monocytes Relative: 6.1 % (ref 3.0–12.0)
Platelets: 287 10*3/uL (ref 150.0–400.0)
RDW: 25 % — ABNORMAL HIGH (ref 11.5–14.6)
WBC: 9.9 10*3/uL (ref 4.5–10.5)

## 2013-10-30 MED ORDER — ESOMEPRAZOLE MAGNESIUM 40 MG PO CPDR: 40.0000 mg | DELAYED_RELEASE_CAPSULE | Freq: Two times a day (BID) | ORAL | Status: DC

## 2013-10-30 NOTE — Progress Notes (Signed)
[name] 12/05/1940 [id number]   History of Present Illness: This is a white female with BOOP syndrome based on a lung biopsy in 2003 which is followed by Dr Craige Cotta. She has chronic gastroesophageal reflux which has been treated in the past with Nexium and most recently with Prilosec 40 mg twice a day. She has recently had increased reflux symptoms. She was found to have a drop in her hemoglobin from 13 to 11.5 by Dr Pete Glatter with a low MCV of 76 and iron saturation of 6%. She has been postmenopausal since age 76. She denies any visible blood per rectum. She had 2 colonoscopies in the past; one in February 2004 and one in March 2009. Both were normal. She also had an upper endoscopy in March 2009 which showed gastritis. She had a prior cholecystectomy. Her bowel habits are regular and she denies any nausea or vomiting but has occasional dysphagia. She has always been very slim, almost cachectic, but she claims to be eating healthy food.    Past Medical History  Diagnosis Date  . GERD (gastroesophageal reflux disease)     severe  . BOOP (bronchiolitis obliterans with organizing pneumonia)   . Bronchiectasis   . Raynaud's disease   . Pneumonia   . Esophageal dysmotility   . Gastroparesis   . Right lower lobe pneumonia December 2013    Past Surgical History  Procedure Laterality Date  . Cholecystectomy  2005  . Breast lumpectomy      left, x 2  . Total abdominal hysterectomy    . Lung biopsy      vats right 2003    Allergies  Allergen Reactions  . Cortisone   . Sulfonamide Derivatives     Family history and social history have been reviewed.  Review of Systems: Positive for heartburn. Positive for intermittent dysphagia to solids and liquids. Denies abdominal pain  The remainder of the 10 point ROS is negative except as outlined in the H&P  Physical Exam: General Appearance cachectic, in no distress Eyes  Non icteric  HEENT  Non traumatic, normocephalic  Mouth No  lesion, tongue papillated, no cheilosis, temporal wasting Neck Supple without adenopathy, thyroid not enlarged, no carotid bruits, no JVD Lungs Clear to auscultation bilaterally high-pitched expiratory wheezes COR Normal S1, normal S2, regular rhythm, no murmur, quiet precordium Abdomen scaphoid pain with visible veins. Normal active bowel sounds. No tenderness. No tympany. No palpable mass Rectal soft Hemoccult negative stool Extremities  No pedal edema Skin No lesions Neurological Alert and oriented x 3 Psychological Normal mood and affect  Assessment and Plan:   Problem #1 New-onset iron deficiency anemia and mild drop in her hemoglobin from 13 g to 11.5 g. She has no specific symptoms other than gastroesophageal reflux. She has known gastritis from the past. She takes aspirin on a daily basis but no anti-inflammatory medications. She is up-to-date on her colonoscopy which was normal on 2 occasions in 2004 and again in 2009. Clearly, she must have had some GI-related blood loss in the past. Since her symptoms are predominantly upper gastrointestinal, we will go ahead with an upper endoscopy, small bowel biopsies and we will rule out Barrett's esophagus. We will switch her from Prilosec to Nexium 40 mg twice a day. Depending on the findings during upper endoscopy and results of small bowel biopsies, we will decide whether a colonoscopy would be indicated (she is currently on a 10 year recall). She will continue iron supplements and we will recheck her  blood count and B12 level today.    [name] 10/30/2013

## 2013-10-30 NOTE — Patient Instructions (Signed)
You have been scheduled for an endoscopy with propofol. Please follow written instructions given to you at your visit today. If you use inhalers (even only as needed), please bring them with you on the day of your procedure. Your physician has requested that you go to www.startemmi.com and enter the access code given to you at your visit today. This web site gives a general overview about your procedure. However, you should still follow specific instructions given to you by our office regarding your preparation for the procedure.  Your physician has requested that you go to the basement for the following lab work before leaving today: CBC, B12  We have sent the following medications to your pharmacy for you to pick up at your convenience: Nexium 40 mg twice daily (in place of omeprazole/Prilosec)  CC: Dr Pete Glatter

## 2013-10-31 ENCOUNTER — Ambulatory Visit (AMBULATORY_SURGERY_CENTER): Payer: Medicare Other | Admitting: Internal Medicine

## 2013-10-31 ENCOUNTER — Encounter: Payer: Self-pay | Admitting: Internal Medicine

## 2013-10-31 VITALS — BP 137/83 | HR 55 | Temp 96.7°F | Resp 29 | Ht 63.0 in | Wt 91.0 lb

## 2013-10-31 DIAGNOSIS — D649 Anemia, unspecified: Secondary | ICD-10-CM

## 2013-10-31 DIAGNOSIS — K2100 Gastro-esophageal reflux disease with esophagitis, without bleeding: Secondary | ICD-10-CM

## 2013-10-31 DIAGNOSIS — D1330 Benign neoplasm of unspecified part of small intestine: Secondary | ICD-10-CM

## 2013-10-31 DIAGNOSIS — K219 Gastro-esophageal reflux disease without esophagitis: Secondary | ICD-10-CM

## 2013-10-31 MED ORDER — SODIUM CHLORIDE 0.9 % IV SOLN: 500.0000 mL | INTRAVENOUS | Status: DC

## 2013-10-31 NOTE — Progress Notes (Signed)
Patient did not experience any of the following events: a burn prior to discharge; a fall within the facility; wrong site/side/patient/procedure/implant event; or a hospital transfer or hospital admission upon discharge from the facility. (G8907) Patient did not have preoperative order for IV antibiotic SSI prophylaxis. (G8918)  

## 2013-10-31 NOTE — Op Note (Addendum)
[facility] [address] [place], [address]   ENDOSCOPY PROCEDURE REPORT  PATIENT: [name], [name]  MR#: [id number] BIRTHDATE: 10/27/1941 , 72  yrs. old GENDER: Female ENDOSCOPIST: Hart Carwin, MD REFERRED BY:  Storm Frisk, M.D. , Dr Pete Glatter PROCEDURE DATE:  10/31/2013 PROCEDURE:  EGD w/ biopsy ASA CLASS:     Class III INDICATIONS:  iron deficiency, 6% iron saturation.  Hemoglobin has dropped from 13.1 to 11.5, Hemoccult negative.  History of gastroesophageal reflux.  Last upper endoscopy in March 2009 showed gastritis. MEDICATIONS: MAC sedation, administered by CRNA and Propofol (Diprivan) 160 mg IV TOPICAL ANESTHETIC: Cetacaine Spray  DESCRIPTION OF PROCEDURE: After the risks benefits and alternatives of the procedure were thoroughly explained, informed consent was obtained.  The LB [id number] endoscope was introduced through the mouth and advanced to the second portion of the duodenum. Without limitations.  The instrument was slowly withdrawn as the mucosa was fully examined.      Esophagus: esophageal mucosa throughout the esophagus showed pinpoint erosions there was no exudate and no active bleeding, the erosions were less than 1 mm in diameter and were circular. Biopsies were obtained. The mucosa appeared somewhat bumpy and nodular the Z line was irregular. Biopsies were taken from the Z line to rule out Barrett's esophagus. There was no stricture or hiatal hernia Stomach: Gastric mucosa appeared normal throughout body in the gastric antrum. Pyloric outlet was normal. Retroflexion of the endoscope revealed normal fundus and cardia Duodenum: Duodenal bulb and descending duodenum appeared normal. Biopsies were taken from second portion duodenum to rule out villous atrophy[         The scope was then withdrawn from the patient and the procedure completed.  COMPLICATIONS: There were no complications. ENDOSCOPIC  IMPRESSION: nonspecific esophagitis status post biopsies. Rule out eosinophilic esophagitis Irregular Z line. Status post biopsies Status post biopsies from the small bowel to rule out villous atrophy  The above findings may be indicative of a low-grade GI blood loss. We will wait on the biopsies before we decide whether to proceed with colonoscopy. She will continue her iron supplements RECOMMENDATIONS: 1.  Await pathology results 2.  Antireflux measures Continue PPI Monitor hemoglobin in response to iron supplements 3.  Antireflux measures Continue PPI Monitor hemoglobin in response to iron supplements  REPEAT EXAM: for EGD pending biopsy results.  eSigned:  Hart Carwin, MD 10/31/2013 5:06 PM   CC:  PATIENT NAME:  Zuriah, Bordas MR#: [id number]

## 2013-10-31 NOTE — Progress Notes (Signed)
Procedure ends, to recovery, report given and VSS. 

## 2013-10-31 NOTE — Patient Instructions (Signed)
Discharge instructions given with verbal understanding. Biopsies taken. Resume previous medications. YOU HAD AN ENDOSCOPIC PROCEDURE TODAY AT THE [facility]: Refer to the procedure report that was given to you for any specific questions about what was found during the examination.  If the procedure report does not answer your questions, please call your gastroenterologist to clarify.  If you requested that your care partner not be given the details of your procedure findings, then the procedure report has been included in a sealed envelope for you to review at your convenience later.  YOU SHOULD EXPECT: Some feelings of bloating in the abdomen. Passage of more gas than usual.  Walking can help get rid of the air that was put into your GI tract during the procedure and reduce the bloating. If you had a lower endoscopy (such as a colonoscopy or flexible sigmoidoscopy) you may notice spotting of blood in your stool or on the toilet paper. If you underwent a bowel prep for your procedure, then you may not have a normal bowel movement for a few days.  DIET: Your first meal following the procedure should be a light meal and then it is ok to progress to your normal diet.  A half-sandwich or bowl of soup is an example of a good first meal.  Heavy or fried foods are harder to digest and may make you feel nauseous or bloated.  Likewise meals heavy in dairy and vegetables can cause extra gas to form and this can also increase the bloating.  Drink plenty of fluids but you should avoid alcoholic beverages for 24 hours.  ACTIVITY: Your care partner should take you home directly after the procedure.  You should plan to take it easy, moving slowly for the rest of the day.  You can resume normal activity the day after the procedure however you should NOT DRIVE or use heavy machinery for 24 hours (because of the sedation medicines used during the test).    SYMPTOMS TO REPORT IMMEDIATELY: A gastroenterologist  can be reached at any hour.  During normal business hours, 8:30 AM to 5:00 PM Monday through Friday, call [phone].  After hours and on weekends, please call the GI answering service at [phone] who will take a message and have the physician on call contact you.   Following upper endoscopy (EGD)  Vomiting of blood or coffee ground material  New chest pain or pain under the shoulder blades  Painful or persistently difficult swallowing  New shortness of breath  Fever of 100F or higher  Black, tarry-looking stools  FOLLOW UP: If any biopsies were taken you will be contacted by phone or by letter within the next 1-3 weeks.  Call your gastroenterologist if you have not heard about the biopsies in 3 weeks.  Our staff will call the home number listed on your records the next business day following your procedure to check on you and address any questions or concerns that you may have at that time regarding the information given to you following your procedure. This is a courtesy call and so if there is no answer at the home number and we have not heard from you through the emergency physician on call, we will assume that you have returned to your regular daily activities without incident.  SIGNATURES/CONFIDENTIALITY: You and/or your care partner have signed paperwork which will be entered into your electronic medical record.  These signatures attest to the fact that that the information above on your After   Visit Summary has been reviewed and is understood.  Full responsibility of the confidentiality of this discharge information lies with you and/or your care-partner. 

## 2013-10-31 NOTE — Progress Notes (Signed)
Called to room to assist during endoscopic procedure.  Patient ID and intended procedure confirmed with present staff. Received instructions for my participation in the procedure from the performing physician.  

## 2013-11-01 ENCOUNTER — Telehealth: Payer: Self-pay

## 2013-11-01 NOTE — Telephone Encounter (Signed)
  Follow up Call-  Call back number 10/31/2013  Post procedure Call Back phone  # [phone]  Permission to leave phone message Yes     Patient questions:  Do you have a fever, pain , or abdominal swelling? no Pain Score  0 *  Have you tolerated food without any problems? yes  Have you been able to return to your normal activities? yes  Do you have any questions about your discharge instructions: Diet   no Medications  no Follow up visit  no  Do you have questions or concerns about your Care? no  Actions: * If pain score is 4 or above: No action needed, pain <4.

## 2013-11-10 ENCOUNTER — Other Ambulatory Visit: Payer: Self-pay | Admitting: Pulmonary Disease

## 2013-11-19 ENCOUNTER — Encounter: Payer: Self-pay | Admitting: Internal Medicine

## 2013-11-20 ENCOUNTER — Telehealth: Payer: Self-pay | Admitting: Pulmonary Disease

## 2013-11-20 NOTE — Telephone Encounter (Signed)
°  Called patient to schedule a follow up apt from the recall list. Left messages x3. Sent letter 11/20/12 °

## 2013-12-27 ENCOUNTER — Other Ambulatory Visit: Payer: Self-pay | Admitting: Geriatric Medicine

## 2013-12-27 DIAGNOSIS — R7989 Other specified abnormal findings of blood chemistry: Secondary | ICD-10-CM

## 2013-12-27 DIAGNOSIS — R945 Abnormal results of liver function studies: Principal | ICD-10-CM

## 2013-12-28 ENCOUNTER — Ambulatory Visit
Admission: RE | Admit: 2013-12-28 | Discharge: 2013-12-28 | Disposition: A | Discharge status: Home / Self Care | Payer: Medicare Other | Source: Ambulatory Visit | Attending: Geriatric Medicine | Admitting: Geriatric Medicine

## 2013-12-28 DIAGNOSIS — R7989 Other specified abnormal findings of blood chemistry: Secondary | ICD-10-CM

## 2013-12-28 DIAGNOSIS — R945 Abnormal results of liver function studies: Principal | ICD-10-CM

## 2014-02-21 ENCOUNTER — Other Ambulatory Visit: Payer: Self-pay | Admitting: Dermatology

## 2014-03-29 ENCOUNTER — Other Ambulatory Visit: Payer: Self-pay | Admitting: Internal Medicine

## 2014-04-15 ENCOUNTER — Telehealth: Payer: Self-pay | Admitting: Internal Medicine

## 2014-04-15 NOTE — Telephone Encounter (Signed)
Patient's insurance has approved omeprazole from 03/25/14-04/15/15. Auth # is [id number].

## 2014-04-18 ENCOUNTER — Other Ambulatory Visit: Payer: Self-pay | Admitting: Pulmonary Disease

## 2014-04-18 ENCOUNTER — Telehealth: Payer: Self-pay | Admitting: *Deleted

## 2014-04-18 NOTE — Telephone Encounter (Signed)
LMOM x 1  Needs OV with VS - first available Singulair rx sent to [facility]. 90 day supply x 1 RF

## 2014-04-19 NOTE — Telephone Encounter (Signed)
Pt made appt on 7/9. And pt aware rx was sent.  Nothing further is needed at this time.

## 2014-04-25 ENCOUNTER — Telehealth: Payer: Self-pay | Admitting: Pulmonary Disease

## 2014-04-25 NOTE — Telephone Encounter (Signed)
Dr. Olevia Perches is refilling pt omperazole. It was last refilled 03/29/14 #[facility]  I called made pt aware. Nothing further needed

## 2014-05-23 ENCOUNTER — Ambulatory Visit (INDEPENDENT_AMBULATORY_CARE_PROVIDER_SITE_OTHER): Payer: Medicare Other | Admitting: Pulmonary Disease

## 2014-05-23 ENCOUNTER — Encounter: Payer: Self-pay | Admitting: Pulmonary Disease

## 2014-05-23 VITALS — BP 102/70 | HR 76 | Ht 63.0 in | Wt 91.8 lb

## 2014-05-23 DIAGNOSIS — K2100 Gastro-esophageal reflux disease with esophagitis, without bleeding: Secondary | ICD-10-CM

## 2014-05-23 DIAGNOSIS — J301 Allergic rhinitis due to pollen: Secondary | ICD-10-CM

## 2014-05-23 DIAGNOSIS — J479 Bronchiectasis, uncomplicated: Secondary | ICD-10-CM

## 2014-05-23 NOTE — Progress Notes (Signed)
Chief Complaint  Patient presents with  . Follow-up    Pt c/o nasal congestion, runny nose, raspy cough with sore throat. Pt clears throat multiple times throughout the day. Pt would like to know if she can change her allergy meds to take only singulair.     History of Present Illness: [name] is a 73 y.o. female former smoker with bronchiectasis, and recurrent aspiration.  She has prior history of cryptogenic organizing pneumonia.  She has noticed more sinus congestion for the past one month.  She is getting drippy and runny nose.  She is feeling more stuffy in her nose.  She is getting PND and this triggers cough with wheeze and clear sputum.    Tests: PFT 12/31/09 >> FEV1 1.84(84%), FEV1% 73, TLC 4.94(98%), DLCO 77%, +BD. CT chest 08/17/10 >> mild biapical scarring, basilar peribronchovascular nodularity and mild bronchiectasis.  She  has a past medical history of GERD (gastroesophageal reflux disease); BOOP (bronchiolitis obliterans with organizing pneumonia); Bronchiectasis; Raynaud's disease; Pneumonia; Esophageal dysmotility; Gastroparesis; and Right lower lobe pneumonia (December 2013).  She  has past surgical history that includes Cholecystectomy (2005); Breast lumpectomy; Total abdominal hysterectomy; and Lung biopsy.   Current Outpatient Prescriptions on File Prior to Visit  Medication Sig Dispense Refill  . Acetaminophen (TYLENOL EXTRA STRENGTH PO) Take 350 mg by mouth 3 (three) times daily.      . benzonatate (TESSALON) 100 MG capsule Take 1 capsule (100 mg total) by mouth 3 (three) times daily as needed.  90 capsule  3  . Cholecalciferol (VITAMIN D) 400 UNITS capsule Take 400 Units by mouth daily.        [name] esomeprazole (NEXIUM) 40 MG capsule Take 1 capsule (40 mg total) by mouth 2 (two) times daily.  180 capsule  0  . estrogens, conjugated, (PREMARIN) 0.45 MG tablet Take 0.45 mg by mouth daily. Take daily for 21 days then do not take for 7 days.       . fexofenadine  (ALLEGRA) 180 MG tablet Take 180 mg by mouth daily.      . montelukast (SINGULAIR) 10 MG tablet TAKE 1 TABLET AT BEDTIME  90 tablet  0  . Multiple Vitamins-Calcium (VIACTIV MULTI-VITAMIN) CHEW Chew 1 tablet by mouth 4 (four) times daily.        . NON FORMULARY Tumeric 2 tabs daily      . omeprazole (PRILOSEC) 40 MG capsule TAKE 1 CAPSULE TWICE A DAY  180 capsule  2  . Probiotic Product (PROBIOTIC DAILY PO) Take 1 capsule by mouth daily.       No current facility-administered medications on file prior to visit.    Allergies  Allergen Reactions  . Cortisone   . Sulfonamide Derivatives     Physical Exam:  General - thin, no distress HEENT - no sinus tenderness, no oral exudate, no LAN Cardiac - s1s2 regular, no murmur Chest - prolonged exhalation, normal respiratory excursion, barrel chest, no wheeze/rales Abdomen - soft, nontender Extremities - no edema Skin - no rashes Neurologic - normal strength Psychiatric - normal mood, behavior  Assessment/Plan:  Chesley Mires, MD [place] 05/23/2014, 3:06 PM Pager:  [phone] After 3pm call: [phone]

## 2014-05-23 NOTE — Assessment & Plan Note (Signed)
Stable.  She is to continue singulair and prn albuterol.  She will need booster for Pneumococcal 23 in December 2015.

## 2014-05-23 NOTE — Assessment & Plan Note (Signed)
She is to try using allegra D for a few days.  She is to continue singulair.  She is intolerant of nose sprays, and mucinex was ineffective.

## 2014-05-23 NOTE — Patient Instructions (Signed)
Try using allegra D for few days until sinus congestion improves Follow up in 6 months

## 2014-05-23 NOTE — Assessment & Plan Note (Signed)
She is to continue omeprazole.  Advised that I can refill this if needed.

## 2014-07-17 ENCOUNTER — Other Ambulatory Visit: Payer: Self-pay | Admitting: Pulmonary Disease

## 2014-10-13 ENCOUNTER — Other Ambulatory Visit: Payer: Self-pay | Admitting: Pulmonary Disease

## 2014-11-22 ENCOUNTER — Ambulatory Visit (INDEPENDENT_AMBULATORY_CARE_PROVIDER_SITE_OTHER): Payer: Medicare Other

## 2014-11-22 DIAGNOSIS — Z23 Encounter for immunization: Secondary | ICD-10-CM

## 2014-12-09 ENCOUNTER — Telehealth: Payer: Self-pay | Admitting: Internal Medicine

## 2014-12-10 NOTE — Telephone Encounter (Signed)
Try Nexiem 40 mg po bid if covered by insurance #60, 3 refills. Add Pepcid 40 mg, #30, 1 po in the middle of the day.

## 2014-12-10 NOTE — Telephone Encounter (Signed)
Dr Olevia Perches- Patient has increased prilosec to 40 mg three times daily on her own. She states that the twice daily dosage doesn't help her symptoms anymore and she has esophageal burning and dysphagia on twice daily dosing. She is requesting more medication per month. I advised that we typically do not give tid dosing on PPI's. She was last seen 10/2013. Do you want her to come for office visit or change PPI?

## 2014-12-11 MED ORDER — ESOMEPRAZOLE MAGNESIUM 40 MG PO CPDR: 40.0000 mg | DELAYED_RELEASE_CAPSULE | Freq: Two times a day (BID) | ORAL | Status: DC

## 2014-12-11 MED ORDER — FAMOTIDINE 40 MG PO TABS: 40.0000 mg | ORAL_TABLET | Freq: Every day | ORAL | Status: DC

## 2014-12-11 NOTE — Telephone Encounter (Signed)
I have spoken to patient and have given her Dr Nichola Sizer recommendations. She verbalizes understanding. She prefers mail in pharmacy so we will send a 90 day supply to [facility]. Advised if this still does not do well with this regimen, she needs to be seen in the office for follow up.

## 2014-12-18 ENCOUNTER — Telehealth: Payer: Self-pay | Admitting: *Deleted

## 2014-12-18 NOTE — Telephone Encounter (Signed)
Faxed over a prior authorization form for Esomeprazole, 40 mg, to [facility] at [phone]. Waiting on response.

## 2014-12-19 ENCOUNTER — Telehealth: Payer: Self-pay | Admitting: *Deleted

## 2014-12-19 NOTE — Telephone Encounter (Signed)
Prior authorization for Esomeprazole, Dx GERD, was approved through [facility]. Approval effective from 11/19/2014 through 12/19/2015. Case ID# [id number].

## 2014-12-22 ENCOUNTER — Other Ambulatory Visit: Payer: Self-pay | Admitting: Pulmonary Disease

## 2015-01-08 ENCOUNTER — Encounter: Payer: Self-pay | Admitting: Pulmonary Disease

## 2015-01-08 ENCOUNTER — Ambulatory Visit (INDEPENDENT_AMBULATORY_CARE_PROVIDER_SITE_OTHER): Payer: Medicare Other | Admitting: Pulmonary Disease

## 2015-01-08 VITALS — BP 102/68 | HR 67 | Temp 97.5°F | Ht 65.0 in | Wt 98.2 lb

## 2015-01-08 DIAGNOSIS — Z23 Encounter for immunization: Secondary | ICD-10-CM

## 2015-01-08 DIAGNOSIS — J479 Bronchiectasis, uncomplicated: Secondary | ICD-10-CM | POA: Diagnosis not present

## 2015-01-08 DIAGNOSIS — J301 Allergic rhinitis due to pollen: Secondary | ICD-10-CM

## 2015-01-08 NOTE — Progress Notes (Signed)
Chief Complaint  Patient presents with  . Follow-up    No complaints. Pt reports breathing is doing well.     History of Present Illness: [name] is a 74 y.o. female former smoker with bronchiectasis, and recurrent aspiration.  She has prior history of cryptogenic organizing pneumonia.  She has been doing okay.  She gets occasional cough and chest soreness.  She denies fever or wheeze.  She exercises on her bike, and she feels this helps.  She is using singulair and allegra daily.  Tests: PFT 12/31/09 >> FEV1 1.84(84%), FEV1% 73, TLC 4.94(98%), DLCO 77%, +BD. CT chest 08/17/10 >> mild biapical scarring, basilar peribronchovascular nodularity and mild bronchiectasis.  PMHx >> GERD, Gastroparesis, Raynaud's, Neuropathy Rt leg, endometriosis  PSHx, Medications, Allergies, Fhx, Shx reviewed.   Physical Exam: Blood pressure 102/68, pulse 67, temperature 97.5 F (36.4 C), temperature source Oral, height 5\' 5"  (1.651 m), weight 98 lb 3.2 oz (44.543 kg), SpO2 98 %. Body mass index is 16.34 kg/(m^2).  General - thin, no distress HEENT - no sinus tenderness, no oral exudate, no LAN Cardiac - s1s2 regular, no murmur Chest - prolonged exhalation, normal respiratory excursion, barrel chest, no wheeze/rales Abdomen - soft, nontender Extremities - no edema Skin - no rashes Neurologic - normal strength Psychiatric - normal mood, behavior  Assessment/Plan:  Bronchiectasis related to prior episodes of PNA and BOOP. Stable. Plan: - defer additional testing unless she has symptoms progression - continue singulair and prn albuterol - pneumovax today  Allergic rhinitis. Plan: - continue singulair, allegra   Chesley Mires, MD [facility] Pulmonary/Critical Care 01/08/2015, 3:03 PM Pager:  [phone] After 3pm call: [phone]

## 2015-01-08 NOTE — Patient Instructions (Signed)
Follow up in 6 months 

## 2015-01-10 ENCOUNTER — Encounter: Payer: Self-pay | Admitting: Internal Medicine

## 2015-01-10 ENCOUNTER — Telehealth: Payer: Self-pay | Admitting: Pulmonary Disease

## 2015-01-10 ENCOUNTER — Ambulatory Visit (INDEPENDENT_AMBULATORY_CARE_PROVIDER_SITE_OTHER): Payer: Medicare Other | Admitting: Internal Medicine

## 2015-01-10 VITALS — BP 112/70 | HR 90 | Temp 98.5°F

## 2015-01-10 DIAGNOSIS — T7840XA Allergy, unspecified, initial encounter: Secondary | ICD-10-CM

## 2015-01-10 MED ORDER — METHYLPREDNISOLONE ACETATE 80 MG/ML IJ SUSP
120.0000 mg | Freq: Once | INTRAMUSCULAR | Status: AC
  Administered 2015-01-10: 120 mg via INTRAMUSCULAR

## 2015-01-10 NOTE — Telephone Encounter (Signed)
Pt has been added on to MW's schedule for this.  Nothing further needed with this phone note.

## 2015-01-10 NOTE — Patient Instructions (Addendum)
Elevation / ice / benadryl 25 mg 1-2 every 4 hours as needed   Go to the ER if condition worsens despite the above especially if you notice worsening tingling/ numbness in your hand/fingers   As far as your throat clearing: GERD (REFLUX)  is an extremely common cause of respiratory symptoms just like yours , many times with no obvious heartburn at all.    It can be treated with medication, but also with lifestyle changes including avoidance of late meals, excessive alcohol, smoking cessation, and avoid fatty foods, chocolate, peppermint, colas, red wine, and acidic juices such as orange juice.  NO MINT OR MENTHOL PRODUCTS SO NO COUGH DROPS  USE SUGARLESS CANDY INSTEAD (Jolley ranchers or Stover's or Life Savers) or even ice chips will also do - the key is to swallow to prevent all throat clearing. NO OIL BASED VITAMINS - use powdered substitutes- try off the fish oil and eat more salmon

## 2015-01-11 ENCOUNTER — Encounter: Payer: Self-pay | Admitting: Internal Medicine

## 2015-01-11 DIAGNOSIS — T7840XA Allergy, unspecified, initial encounter: Secondary | ICD-10-CM | POA: Insufficient documentation

## 2015-01-11 NOTE — Assessment & Plan Note (Signed)
Relatively mild, localized s N/V compromise  RUE  rec symptomatic rx : depomedrol 120 IM ( allergy to steroids is cns hyperactivity) Benadryl ICE/ elevation  Recheck in 72 h prn

## 2015-01-11 NOTE — Progress Notes (Signed)
             Brief patient profile:  [name] is a 74 y.o. female former smoker with bronchiectasis, and recurrent aspiration.  She has prior history of cryptogenic organizing pneumonia.  01/08/15  Given pneumovax R prox arm   01/10/2015 f/acute  ov/Danyelle Brookover re: allergic rx Chief Complaint  Patient presents with  . Acute Visit    RT arm red,tight, difficult to move from shoulder to elbow; received Pneumo 23 vaccine; has been taking Ibuprofen  onset was acute pm 01/08/15, main symptoms is skin is tight, pattern is slowly progressing from shot site to prox forearm on R not applying ice or elevation/ no numbness or tingling distally, no fever or sob      Tests: PFT 12/31/09 >> FEV1 1.84(84%), FEV1% 73, TLC 4.94(98%), DLCO 77%, +BD. CT chest 08/17/10 >> mild biapical scarring, basilar peribronchovascular nodularity and mild bronchiectasis.  PMHx >> GERD, Gastroparesis, Raynaud's, Neuropathy Rt leg, endometriosis  PSHx, Medications, Allergies, Fhx, Shx reviewed.   Physical Exam: amb pleasant thin wf nad  Wt Readings from Last 3 Encounters:  01/08/15 98 lb 3.2 oz (44.543 kg)  05/23/14 91 lb 12.8 oz (41.64 kg)  10/31/13 91 lb (41.277 kg)    Vital signs reviewed  .  General - thin, no distress HEENT - no sinus tenderness, no oral exudate, no LAN Cardiac - s1s2 regular, no murmur Chest - prolonged exhalation, normal respiratory excursion, barrel chest, no wheeze/rales Abdomen - soft, nontender Extremities - no edema Skin - mild erthyema and sts exending from prox humerus to elbow, nl pulses, strength and motion in elbow/wriste/ hand/fingers on R  Neurologic - normal strength Psychiatric - normal mood, behavior  Assessment/Plan:

## 2015-01-17 ENCOUNTER — Ambulatory Visit: Payer: Medicare Other | Admitting: Internal Medicine

## 2015-02-25 ENCOUNTER — Other Ambulatory Visit: Payer: Self-pay | Admitting: Internal Medicine

## 2015-02-28 ENCOUNTER — Other Ambulatory Visit: Payer: Self-pay | Admitting: Internal Medicine

## 2015-03-22 ENCOUNTER — Other Ambulatory Visit: Payer: Self-pay | Admitting: Pulmonary Disease

## 2015-05-16 ENCOUNTER — Other Ambulatory Visit: Payer: Self-pay

## 2015-05-20 ENCOUNTER — Other Ambulatory Visit: Payer: Self-pay

## 2015-05-21 ENCOUNTER — Telehealth: Payer: Self-pay

## 2015-05-21 ENCOUNTER — Other Ambulatory Visit: Payer: Self-pay

## 2015-05-21 NOTE — Telephone Encounter (Signed)
Refill request from [facility] for Diltiazem ER 240mg . prescriber is Teressa Senter. This medication is not on her list of meds here. She has an appointment with Dr. Harrington Challenger in mid July. Will not authorize a refill.

## 2015-05-24 ENCOUNTER — Other Ambulatory Visit: Payer: Self-pay | Admitting: Internal Medicine

## 2015-05-27 ENCOUNTER — Encounter: Payer: Self-pay | Admitting: Internal Medicine

## 2015-05-27 ENCOUNTER — Other Ambulatory Visit: Payer: Medicare Other

## 2015-05-27 ENCOUNTER — Other Ambulatory Visit (INDEPENDENT_AMBULATORY_CARE_PROVIDER_SITE_OTHER): Payer: Medicare Other

## 2015-05-27 ENCOUNTER — Ambulatory Visit (INDEPENDENT_AMBULATORY_CARE_PROVIDER_SITE_OTHER): Payer: Medicare Other | Admitting: Internal Medicine

## 2015-05-27 VITALS — BP 100/70 | HR 88 | Ht 63.0 in | Wt 92.4 lb

## 2015-05-27 DIAGNOSIS — K2100 Gastro-esophageal reflux disease with esophagitis, without bleeding: Secondary | ICD-10-CM

## 2015-05-27 DIAGNOSIS — D509 Iron deficiency anemia, unspecified: Secondary | ICD-10-CM

## 2015-05-27 LAB — CBC WITH DIFFERENTIAL/PLATELET
BASOS PCT: 0.4 % (ref 0.0–3.0)
Basophils Absolute: 0 10*3/uL (ref 0.0–0.1)
EOS ABS: 0.1 10*3/uL (ref 0.0–0.7)
Eosinophils Relative: 1.3 % (ref 0.0–5.0)
HCT: 38.3 % (ref 36.0–46.0)
HEMOGLOBIN: 12.1 g/dL (ref 12.0–15.0)
LYMPHS ABS: 1.5 10*3/uL (ref 0.7–4.0)
LYMPHS PCT: 17.4 % (ref 12.0–46.0)
MCHC: 31.5 g/dL (ref 30.0–36.0)
MCV: 86.1 fl (ref 78.0–100.0)
MONO ABS: 0.5 10*3/uL (ref 0.1–1.0)
MONOS PCT: 5.8 % (ref 3.0–12.0)
Neutro Abs: 6.3 10*3/uL (ref 1.4–7.7)
Neutrophils Relative %: 75.1 % (ref 43.0–77.0)
Platelets: 471 10*3/uL — ABNORMAL HIGH (ref 150.0–400.0)
RBC: 4.45 Mil/uL (ref 3.87–5.11)
RDW: 22.6 % — ABNORMAL HIGH (ref 11.5–15.5)
WBC: 8.4 10*3/uL (ref 4.0–10.5)

## 2015-05-27 LAB — IBC PANEL
Iron: 234 ug/dL — ABNORMAL HIGH (ref 42–145)
SATURATION RATIOS: 63.1 % — AB (ref 20.0–50.0)
Transferrin: 265 mg/dL (ref 212.0–360.0)

## 2015-05-27 LAB — VITAMIN B12: Vitamin B-12: 347 pg/mL (ref 211–911)

## 2015-05-27 MED ORDER — LINACLOTIDE 145 MCG PO CAPS: 145.0000 ug | ORAL_CAPSULE | Freq: Every day | ORAL | Status: DC

## 2015-05-27 MED ORDER — ESOMEPRAZOLE MAGNESIUM 40 MG PO CPDR: 40.0000 mg | DELAYED_RELEASE_CAPSULE | Freq: Two times a day (BID) | ORAL | Status: DC

## 2015-05-27 NOTE — Patient Instructions (Addendum)
You have been scheduled for an endoscopy. Please follow written instructions given to you at your visit today. If you use inhalers (even only as needed), please bring them with you on the day of your procedure. Your physician has requested that you go to www.startemmi.com and enter the access code given to you at your visit today. This web site gives a general overview about your procedure. However, you should still follow specific instructions given to you by our office regarding your preparation for the procedure.  Your physician has requested that you go to the basement for lab work before leaving today.  We have sent your demographic and insurance information to [employer]. They should contact you within the next week regarding your Cologuard (colon cancer screening) test. If you have not heard from them within the next week, please call our office at [phone].  Dr Felipa Eth

## 2015-05-27 NOTE — Progress Notes (Signed)
KAYLAMARIE SWICKARD [birth year] [id number]  Note: This dictation was prepared with Dragon digital system. Any transcriptional errors that result from this procedure are unintentional.   History of Present Illness: This is a 74 year old white female with the recurrent iron deficiency , last office visit December 2014. She has a Boop syndrome followed by Dr.Sood, she has not needed the home oxygen .She has chronic GERD treated with Nexium 40 mg once a day. She has had recent exacerbation of intense burning sore, throat. Also ough and dysphagia. She describes tasting blood in her mouth. she supplements Nexium with Pepcid 40 mg.In  December 2014 hemoglobin was 13's  and 6% iron saturation. Iron level on 04/11/2015 was less than 10 unable to calculate iron saturation.  TIBC was 392  Before  restarteing iron supplements. Repeat hemoglobin on 6/28/ 2016 was up to 11.3, hematocrit 35.6 and MCV of 84. She denies  blood per rectum. Her bowel habits have been regular She was a Hemoccult-negative in the past. She has been on aspirin. Prior endoscopic workup included normal colonoscopies in 2004 and 2009. Nonspecific gastritis in March 2009 on upper endoscopy. Essentially normal upper endoscopy in December 2014 with normal small bowel biopsies. She had a prior cholecystectomy . She denies taking anti-inflammatory agents    Past Medical History  Diagnosis Date  . GERD (gastroesophageal reflux disease)     severe  . BOOP (bronchiolitis obliterans with organizing pneumonia)   . Bronchiectasis   . Raynaud's disease   . Pneumonia   . Esophageal dysmotility   . Gastroparesis   . Right lower lobe pneumonia December 2013    Past Surgical History  Procedure Laterality Date  . Cholecystectomy  2005  . Breast lumpectomy      left, x 2  . Total abdominal hysterectomy    . Lung biopsy      vats right 2003    Allergies  Allergen Reactions  . Cortisone   . Sulfonamide Derivatives     Family history and social  history have been reviewed.  Review of Systems: Positive for cough dysphagia heartburn, dry mouth   The remainder of the 10 point ROS is negative except as outlined in the H&P  Physical Exam: General Appearancvery thin almost cachectic , in no distress Eyes  Non icteric  HEENT  Non traumatic, normocephalic  Mouth No lesion, tongue papillated,  Cheilosis present  Neck Supple without adenopathy, thyroid not enlarged, no carotid bruits, no JVD Lungs Clear to auscultation bilaterally, decreased breath sounds. No wheezes  COR Normal S1, normal S2, regular rhythm, no murmur, quiet precordium Abdomen  scaphoid thin with tenderness along sigmoid colon. No mass or rebound  Rectal  small amount of salt Hemoccult-negative stool  Extremities  No pedal edema Skin No lesions Neurological Alert and oriented x 3 Psychological Normal mood and affect  Assessment and Plan:   74 year old, white female with recurrent iron deficiency anemia which is Hemoccult-negative. She has symptomatic gastroesophageal  reflux refractory to PPI. She had 2 negative upper endoscopies including small bowel biopsies. She had 2 prior colonoscopies. She is somewhat higher risk candidate for sedation because of her respiratory insufficiency. We will increase Nexium to 40 mg twice hoping that her insurance approves it. We will also schedule upper endoscopy because most of her symptoms are upper gastrointestinal. We will also go ahead with small bowel capsule endoscopy to look for small bowel lesions such as AVMs. Because of high risk for  sedation during colonoscopy we will instead perform  a Cologuard test for stool DNA. We will repeat her iron studies today and if then not dramatically improved she will be a good candidate for iron infusion     Delfin Edis 05/27/2015

## 2015-05-28 ENCOUNTER — Telehealth: Payer: Self-pay | Admitting: Internal Medicine

## 2015-05-28 ENCOUNTER — Ambulatory Visit (AMBULATORY_SURGERY_CENTER): Payer: Medicare Other | Admitting: Internal Medicine

## 2015-05-28 ENCOUNTER — Other Ambulatory Visit: Payer: Self-pay | Admitting: *Deleted

## 2015-05-28 ENCOUNTER — Telehealth: Payer: Self-pay | Admitting: *Deleted

## 2015-05-28 ENCOUNTER — Encounter: Payer: Self-pay | Admitting: Internal Medicine

## 2015-05-28 VITALS — BP 122/73 | HR 67 | Temp 97.4°F | Resp 27 | Ht 63.0 in | Wt 92.0 lb

## 2015-05-28 DIAGNOSIS — K219 Gastro-esophageal reflux disease without esophagitis: Secondary | ICD-10-CM

## 2015-05-28 DIAGNOSIS — D509 Iron deficiency anemia, unspecified: Secondary | ICD-10-CM | POA: Diagnosis not present

## 2015-05-28 DIAGNOSIS — K2990 Gastroduodenitis, unspecified, without bleeding: Secondary | ICD-10-CM

## 2015-05-28 DIAGNOSIS — K2970 Gastritis, unspecified, without bleeding: Secondary | ICD-10-CM

## 2015-05-28 DIAGNOSIS — K3184 Gastroparesis: Secondary | ICD-10-CM | POA: Diagnosis not present

## 2015-05-28 DIAGNOSIS — K3189 Other diseases of stomach and duodenum: Secondary | ICD-10-CM | POA: Diagnosis not present

## 2015-05-28 MED ORDER — ESOMEPRAZOLE MAGNESIUM 40 MG PO CPDR: 40.0000 mg | DELAYED_RELEASE_CAPSULE | Freq: Two times a day (BID) | ORAL | Status: DC

## 2015-05-28 MED ORDER — SODIUM CHLORIDE 0.9 % IV SOLN: 500.0000 mL | INTRAVENOUS | Status: DC

## 2015-05-28 NOTE — Progress Notes (Signed)
Stable to RR 

## 2015-05-28 NOTE — Op Note (Signed)
[facility]  [address] [place], [address]   ENDOSCOPY PROCEDURE REPORT  PATIENT: [name], [name]  MR#: [id number] BIRTHDATE: February 26, 1941 , 73  yrs. old GENDER: female ENDOSCOPIST: Lafayette Dragon, MD REFERRED BY:  [name], M.D. PROCEDURE DATE:  05/28/2015 PROCEDURE:  EGD w/ biopsy ASA CLASS:     Class II INDICATIONS:  iron deficiency anemia.  This is a recurrent problem. Patient has been Hemoccult-negative.  Severe gastroesophageal reflux.  Refractory to PPIs. MEDICATIONS: Monitored anesthesia care and Propofol 120 mg IV TOPICAL ANESTHETIC: Cetacaine Spray  DESCRIPTION OF PROCEDURE: After the risks benefits and alternatives of the procedure were thoroughly explained, informed consent was obtained.  The LB [id number] endoscope was introduced through the mouth and advanced to the second portion of the duodenum , Without limitations.  The instrument was slowly withdrawn as the mucosa was fully examined.      ESOPHAGUS: The mucosa of the esophagus appeared normal. squamocolumnar junction was irregular. There was increased fibrosis at the GE junction but no stricture Multiple biopsies were performed using cold forceps.  Sample obtained to rule out Barrett's esophagus.  Sample sent for histology.  Stomach:  Large amount of retained food , mild antral gastritis. Biopsies obtained to rule out H.  pylori.  Normal gastric outlet. Retroflexion of endoscope and the normal fundus and cardia.  DUODENUM: The duodenal mucosa showed no abnormalities in the bulb and 2nd part of the duodenum.  Cold forcep biopsies were taken in the second portion.  Retroflexed views revealed no abnormalities. The scope was then withdrawn from the patient and the procedure completed.  COMPLICATIONS: There were no immediate complications.  ENDOSCOPIC IMPRESSION: 1.   The mucosa of the esophagus appeared normal; multiple biopsies were performed 2.   Large amount of retained  food , mild antral gastritis. Biopsies obtained to rule out H.  pylori.  Normal gastric outlet. Retroflexion of endoscope and the normal fundus and cardia 3.   The duodenal mucosa showed no abnormalities in the bulb and 2nd part of the duodenum cold forcep biopsies were taken in the second portion  nothing to account for iron deficiency anemia, pt  has gastroparesis   RECOMMENDATIONS: 1.  Anti-reflux regimen to be follow 2.  Await biopsy results 3.  Continue PPI 4.  Schedule gastric emptying scan gastroparesis diet 5.Small bowel capsule endoscopy is has been scheduled to look for AV malformations 6. cologuard test has been scheduled to r/o colon neoplasm  REPEAT EXAM: for EGD pending biopsy results.  eSigned:  Lafayette Dragon, MD 05/28/2015 8:31 AM    CC:  PATIENT NAME:  Joscelynn, Brutus MR#: [id number]

## 2015-05-28 NOTE — Telephone Encounter (Signed)
Per Dr. Olevia Perches, needs GES. Scheduled patient at [facility] radiology  On 06/11/15 at 9:30 AM. NPO after midnight and no stomach meds on the morning of procedure. Spoke with Early Chars in [facility] and she will give patient appointment and instructions.

## 2015-05-28 NOTE — Progress Notes (Signed)
Called to room to assist during endoscopic procedure.  Patient ID and intended procedure confirmed with present staff. Received instructions for my participation in the procedure from the performing physician.  

## 2015-05-28 NOTE — Patient Instructions (Signed)
Discharge instructions given. Biopsies taken. Office will schedule Gastric Emptying Scan. Gastroparesis diet given. Resume previous medications. YOU HAD AN ENDOSCOPIC PROCEDURE TODAY AT [facility]:   Refer to the procedure report that was given to you for any specific questions about what was found during the examination.  If the procedure report does not answer your questions, please call your gastroenterologist to clarify.  If you requested that your care partner not be given the details of your procedure findings, then the procedure report has been included in a sealed envelope for you to review at your convenience later.  YOU SHOULD EXPECT: Some feelings of bloating in the abdomen. Passage of more gas than usual.  Walking can help get rid of the air that was put into your GI tract during the procedure and reduce the bloating. If you had a lower endoscopy (such as a colonoscopy or flexible sigmoidoscopy) you may notice spotting of blood in your stool or on the toilet paper. If you underwent a bowel prep for your procedure, you may not have a normal bowel movement for a few days.  Please Note:  You might notice some irritation and congestion in your nose or some drainage.  This is from the oxygen used during your procedure.  There is no need for concern and it should clear up in a day or so.  SYMPTOMS TO REPORT IMMEDIATELY:   Following upper endoscopy (EGD)  Vomiting of blood or coffee ground material  New chest pain or pain under the shoulder blades  Painful or persistently difficult swallowing  New shortness of breath  Fever of 100F or higher  Black, tarry-looking stools  For urgent or emergent issues, a gastroenterologist can be reached at any hour by calling [phone].   DIET: Your first meal following the procedure should be a small meal and then it is ok to progress to your normal diet. Heavy or fried foods are harder to digest and may make you feel nauseous  or bloated.  Likewise, meals heavy in dairy and vegetables can increase bloating.  Drink plenty of fluids but you should avoid alcoholic beverages for 24 hours.  ACTIVITY:  You should plan to take it easy for the rest of today and you should NOT DRIVE or use heavy machinery until tomorrow (because of the sedation medicines used during the test).    FOLLOW UP: Our staff will call the number listed on your records the next business day following your procedure to check on you and address any questions or concerns that you may have regarding the information given to you following your procedure. If we do not reach you, we will leave a message.  However, if you are feeling well and you are not experiencing any problems, there is no need to return our call.  We will assume that you have returned to your regular daily activities without incident.  If any biopsies were taken you will be contacted by phone or by letter within the next 1-3 weeks.  Please call us at [phone] if you have not heard about the biopsies in 3 weeks.    SIGNATURES/CONFIDENTIALITY: You and/or your care partner have signed paperwork which will be entered into your electronic medical record.  These signatures attest to the fact that that the information above on your After Visit Summary has been reviewed and is understood.  Full responsibility of the confidentiality of this discharge information lies with you and/or your care-partner.

## 2015-05-28 NOTE — Telephone Encounter (Signed)
Medication [facility]120 with 3 refills

## 2015-05-29 ENCOUNTER — Telehealth: Payer: Self-pay | Admitting: Emergency Medicine

## 2015-05-29 NOTE — Telephone Encounter (Signed)
Left message, no identifier, f/u

## 2015-06-02 ENCOUNTER — Ambulatory Visit (INDEPENDENT_AMBULATORY_CARE_PROVIDER_SITE_OTHER): Payer: Medicare Other | Admitting: Internal Medicine

## 2015-06-02 ENCOUNTER — Encounter: Payer: Self-pay | Admitting: Internal Medicine

## 2015-06-02 VITALS — BP 118/72 | HR 62 | Ht 63.0 in | Wt 94.4 lb

## 2015-06-02 DIAGNOSIS — R0789 Other chest pain: Secondary | ICD-10-CM

## 2015-06-02 NOTE — Patient Instructions (Signed)
Medication Instructions:  Your physician recommends that you continue on your current medications as directed. Please refer to the Current Medication list given to you today.   Labwork: none  Testing/Procedures: Your physician has requested that you have an echocardiogram. Echocardiography is a painless test that uses sound waves to create images of your heart. It provides your doctor with information about the size and shape of your heart and how well your heart's chambers and valves are working. This procedure takes approximately one hour. There are no restrictions for this procedure.    Follow-Up: To be determined based on results of echocardiogram.

## 2015-06-02 NOTE — Progress Notes (Signed)
Cardiology Office Note   Date:  06/02/2015   ID:  [name], [name] [birth year], MRN [id number]  PCP:  Mathews Argyle, MD  Cardiologist:   Dorris Carnes, MD   No chief complaint on file.  Referred for eval of CP   History of Present Illness: [name] is a 74 y.o. female with a history of bronchiectasis and recurrent aspiration,  Gastroparesis, GERD, anemia  raynauds, neuropathy.   She is followed by [facility]  Seen in May  Had episode of L sided CP radiating to L arm.  DId not seek medical attention.  Before Xmaas had tingling on L side of face  L arm bothering her  Gen didn't feel well  Eyes giving her a fit  They Hurt, vision blurry.  Tired  Urinating a lot.   One morning very weak, tired. Exercises every morning.  Rides for 2 hours about 20 mils That day didn't have energy    Diagnosed with  UTI  Heart racing   Rx antibiotics. Next day after starting L arm excruciating pain   Legs swollen  [facility] put on Fe and Vit C   Has increased indigestion "big time"    Also other night got out of bathtub  Cutting toenails  Said left shin started bleeding Spurting   Legs tingle L and R (does have neuropathy)  Indigestion bad  Nexium calms  Just had endoscopy Stays all day  She notes nochange with activity  Biking now 2 hours per [place] Distance staying the same  Pushes herself         Current Outpatient Prescriptions  Medication Sig Dispense Refill  . Acetaminophen (TYLENOL EXTRA STRENGTH PO) Take 350 mg by mouth 3 (three) times daily as needed.     . Ascorbic Acid (VITAMIN C PO) Take by mouth.    . Calcium-Vitamin D-Vitamin K (VIACTIV PO) Take by mouth.    . esomeprazole (NEXIUM) 40 MG capsule Take 1 capsule (40 mg total) by mouth 2 (two) times daily. 120 capsule 3  . fexofenadine (ALLEGRA) 180 MG tablet Take 180 mg by mouth daily.    . IRON PO Take by mouth.    . montelukast (SINGULAIR) 10 MG tablet TAKE 1 TABLET AT BEDTIME 90 tablet 1  .  Multiple Vitamins-Calcium (VIACTIV MULTI-VITAMIN) CHEW Chew 1 tablet by mouth 4 (four) times daily.      . Omega-3 Fatty Acids (FISH OIL PO) Take 1 capsule by mouth daily.    [name] PREMARIN 0.3 MG tablet Take 0.3 mg by mouth daily.      No current facility-administered medications for this visit.    Allergies:   Cortisone and Sulfonamide derivatives   Past Medical History  Diagnosis Date  . GERD (gastroesophageal reflux disease)     severe  . BOOP (bronchiolitis obliterans with organizing pneumonia)   . Bronchiectasis   . Raynaud's disease   . Pneumonia   . Esophageal dysmotility   . Gastroparesis   . Right lower lobe pneumonia December 2013    Past Surgical History  Procedure Laterality Date  . Cholecystectomy  2005  . Breast lumpectomy      left, x 2  . Total abdominal hysterectomy    . Lung biopsy      vats right 2003     Social History:  The patient  reports that she quit smoking about 46 years ago. Her smoking use included Cigarettes. She has never used smokeless tobacco.  She reports that she does not drink alcohol or use illicit drugs.   Family History:  The patient's family history includes Cancer in her maternal grandmother; Heart disease in her father and paternal grandfather; Leukemia in her paternal grandmother; Lung cancer in her paternal uncle; Lymphoma in her mother. There is no history of Colon cancer.    ROS:  Please see the history of present illness. All other systems are reviewed and  Negative to the above problem except as noted.    PHYSICAL EXAM: VS:  BP 118/72 mmHg  Pulse 62  Ht 5\' 3"  (1.6 m)  Wt 94 lb 6.4 oz (42.82 kg)  BMI 16.73 kg/m2  GEN: Well nourished, well developed, in no acute distress HEENT: normal Neck: no JVD, carotid bruits, or masses Cardiac: RRR; no murmurs, rubs, or gallops,no edema  Respiratory:  clear to auscultation bilaterally, normal work of breathing GI: soft, nontender, nondistended, + BS  No hepatomegaly  MS: no deformity  Moving all extremities   Skin: warm and dry, no rash Neuro:  Strength and sensation are intact Psych: euthymic mood, full affect   EKG:  EKG is ordered today.  SR 62 bpm     Lipid Panel No results found for: CHOL, TRIG, HDL, CHOLHDL, VLDL, LDLCALC, LDLDIRECT    Wt Readings from Last 3 Encounters:  06/02/15 94 lb 6.4 oz (42.82 kg)  05/28/15 92 lb (41.731 kg)  05/27/15 92 lb 6 oz (41.901 kg)      ASSESSMENT AND PLAN:  1.  CP / arm pain  Pain is very atypical  She is very active  I am not convinced cardiac. May be GI I would set up for echo first Continue activities as tolerated     Current medicines are reviewed at length with the patient today.  The patient does not have concerns regarding medicines.  The following changes have been made:   Labs/ tests ordered today include:  Orders Placed This Encounter  Procedures  . EKG 12-Lead  . Echocardiogram     Disposition:   FU with me will depend on test results    Signed, Dorris Carnes, MD  06/02/2015 6:47 PM    [facility] [address], [place], [place]  [address] Phone: [phone]; Fax: [phone]

## 2015-06-03 ENCOUNTER — Other Ambulatory Visit: Payer: Medicare Other

## 2015-06-04 ENCOUNTER — Other Ambulatory Visit: Payer: Self-pay

## 2015-06-04 ENCOUNTER — Ambulatory Visit (HOSPITAL_COMMUNITY): Payer: Medicare Other | Attending: Cardiovascular Disease

## 2015-06-04 DIAGNOSIS — I7300 Raynaud's syndrome without gangrene: Secondary | ICD-10-CM | POA: Insufficient documentation

## 2015-06-04 DIAGNOSIS — R0789 Other chest pain: Secondary | ICD-10-CM

## 2015-06-04 DIAGNOSIS — Z8249 Family history of ischemic heart disease and other diseases of the circulatory system: Secondary | ICD-10-CM | POA: Insufficient documentation

## 2015-06-04 DIAGNOSIS — Z87891 Personal history of nicotine dependence: Secondary | ICD-10-CM | POA: Insufficient documentation

## 2015-06-11 ENCOUNTER — Ambulatory Visit (HOSPITAL_COMMUNITY)
Admission: RE | Admit: 2015-06-11 | Discharge: 2015-06-11 | Disposition: A | Discharge status: Home / Self Care | Payer: Medicare Other | Source: Ambulatory Visit | Attending: Internal Medicine | Admitting: Internal Medicine

## 2015-06-11 ENCOUNTER — Telehealth: Payer: Self-pay

## 2015-06-11 DIAGNOSIS — R112 Nausea with vomiting, unspecified: Secondary | ICD-10-CM | POA: Insufficient documentation

## 2015-06-11 DIAGNOSIS — D509 Iron deficiency anemia, unspecified: Secondary | ICD-10-CM

## 2015-06-11 DIAGNOSIS — K560 Paralytic ileus: Secondary | ICD-10-CM | POA: Diagnosis not present

## 2015-06-11 DIAGNOSIS — D649 Anemia, unspecified: Secondary | ICD-10-CM

## 2015-06-11 MED ORDER — TECHNETIUM TC 99M SULFUR COLLOID
2.2000 | Freq: Once | INTRAVENOUS | Status: AC | PRN
  Administered 2015-06-11: 2.2 via INTRAVENOUS

## 2015-06-11 NOTE — Telephone Encounter (Signed)
-----   Message from Darden Dates sent at 06/11/2015  2:03 PM EDT ----- CAPSULE DENIED BY [employer].  THEY WILL BE SENDING FAX WITH DENIAL INFORMATION

## 2015-06-11 NOTE — Telephone Encounter (Signed)
Dr. Olevia Perches, her insurance has denied her capsule endoscopy.  See records in your office. I left a message for the patient to call back and cancelled it in the system.

## 2015-06-12 LAB — COLOGUARD

## 2015-06-12 NOTE — Telephone Encounter (Signed)
Patient notified  Hemoccult cards mailed

## 2015-06-12 NOTE — Telephone Encounter (Signed)
Please call pt about her insurance not approving Capsule exam. Please send her Hemoccult cards.

## 2015-06-13 ENCOUNTER — Telehealth: Payer: Self-pay | Admitting: Internal Medicine

## 2015-06-13 NOTE — Telephone Encounter (Signed)
Left message for patient to call back  

## 2015-06-16 NOTE — Telephone Encounter (Signed)
Left message for patient to call back  

## 2015-06-16 NOTE — Telephone Encounter (Signed)
All questions answered She was given the results of the GES

## 2015-06-20 ENCOUNTER — Telehealth: Payer: Self-pay | Admitting: *Deleted

## 2015-06-20 LAB — COLOGUARD

## 2015-06-20 NOTE — Telephone Encounter (Signed)
She will need a colonoscopy, SBCE denied by her insurance, Cologuard test is positive. She will need colonoscopy. Please let her see Dr Havery Moros.

## 2015-06-20 NOTE — Telephone Encounter (Signed)
Patient notified of results and recommendations. Scheduled with Dr. Havery Moros on 07/18/15 at 10:30 AM.

## 2015-06-20 NOTE — Telephone Encounter (Signed)
Received a call from [employer] that cologuard test is positive. Please, advise.

## 2015-07-04 ENCOUNTER — Telehealth: Payer: Self-pay | Admitting: *Deleted

## 2015-07-04 ENCOUNTER — Other Ambulatory Visit: Payer: Self-pay

## 2015-07-04 ENCOUNTER — Encounter: Payer: Self-pay | Admitting: *Deleted

## 2015-07-04 NOTE — Telephone Encounter (Signed)
-----   Message from Lafayette Dragon, MD sent at 07/04/2015  8:27 AM EDT ----- Regarding: cologuard Please call pt with negative cologuard test. No need for colonoscopy. Please have Dr Felipa Eth to recheck her Hgb to make sure the Iron is working.

## 2015-07-04 NOTE — Telephone Encounter (Signed)
Patient aware to keep her OV with Dr. Havery Moros.

## 2015-07-04 NOTE — Telephone Encounter (Signed)
Patient called and asked

## 2015-07-04 NOTE — Telephone Encounter (Signed)
Left a message for patient to call back for results

## 2015-07-04 NOTE — Telephone Encounter (Signed)
Called and spoke with patient. She was given positive Cologuard results on 06/20/15. Called [employer] to confirm and patient did have a positive cologuard.

## 2015-07-10 ENCOUNTER — Encounter: Payer: Self-pay | Admitting: Internal Medicine

## 2015-07-18 ENCOUNTER — Ambulatory Visit (INDEPENDENT_AMBULATORY_CARE_PROVIDER_SITE_OTHER): Payer: Medicare Other | Admitting: Gastroenterology

## 2015-07-18 ENCOUNTER — Encounter: Payer: Self-pay | Admitting: Gastroenterology

## 2015-07-18 VITALS — BP 132/80 | HR 68 | Ht 63.0 in | Wt 96.5 lb

## 2015-07-18 DIAGNOSIS — D509 Iron deficiency anemia, unspecified: Secondary | ICD-10-CM

## 2015-07-18 DIAGNOSIS — R109 Unspecified abdominal pain: Secondary | ICD-10-CM | POA: Diagnosis not present

## 2015-07-18 DIAGNOSIS — R195 Other fecal abnormalities: Secondary | ICD-10-CM | POA: Diagnosis not present

## 2015-07-18 MED ORDER — NA SULFATE-K SULFATE-MG SULF 17.5-3.13-1.6 GM/177ML PO SOLN: ORAL | Status: DC

## 2015-07-18 NOTE — Progress Notes (Signed)
HPI :  The patient is a 74 year old female here for evaluation for iron deficiency anemia. The patient reports she has had long-standing intermittent iron deficiency. She has had prior upper and lower endoscopies without a clear etiology. Most recently in May 2016 she had a recurrence of mild anemia with hemoglobin of 11.3 but undetectable iron level. After supplementation with oral iron her iron stores normalized, her hemoglobin improved to 12.1. She reports she has not experienced overt GI bleeding historically. She has roughly one bowel movement per day without blood currently. She does endorse some chronic left-sided mid to lower abdominal pain over the past 2 years. She reports the discomfort is usually mild but constant and she is tender in the area. This discomfort can be made worse by sitting or riding a bike, but is not usually changed by eating or having a bowel movement. She endorses a 6 pound weight loss since this past May. She denies vomiting. She is eating okay, although gets full easily and also has a poor appetite. She endorses a history of reflux which is currently well controlled with Nexium. She also endorses a history of intermittent dysphagia which has been attributed to esophageal dysmotility.  Most recently she had an upper endoscopy in July 2016. There was retained food in the stomach and it was a limited exam, although the small bowel appeared normal with normal small bowel biopsies and biopsies were negative for H. pylori. There were normal biopsies of the gastroesophageal junction. Following her upper endoscopy she was referred for a gastric emptying study which was normal and did not show any delayed gastric emptying. Her last colonoscopy was in 2009 and reportedly normal. She was referred for capsule endoscopy to evaluate small bowel however this was not approved by her insurance. She had a cold card testing on July 28 which returned positive, and is here to discuss this  result.  She has a history of BOOP but reports it is stable. No oxygen requirement. No shortness of breath or chest pains today.   Past Medical History  Diagnosis Date  . GERD (gastroesophageal reflux disease)     severe  . BOOP (bronchiolitis obliterans with organizing pneumonia)   . Bronchiectasis   . Raynaud's disease   . Pneumonia   . Esophageal dysmotility   . Gastroparesis   . Right lower lobe pneumonia December 2013  . Iron deficiency anemia      Past Surgical History  Procedure Laterality Date  . Cholecystectomy  2005  . Breast lumpectomy      left, x 2  . Total abdominal hysterectomy    . Lung biopsy      vats right 2003   Family History  Problem Relation Age of Onset  . Lymphoma Mother   . Lung cancer Paternal Uncle   . Heart disease Father   . Heart disease Paternal Grandfather   . Colon cancer Neg Hx   . Leukemia Paternal Grandmother   . Cancer Maternal Grandmother     unknown type   Social History  Substance Use Topics  . Smoking status: Former Smoker    Types: Cigarettes    Quit date: 11/15/1968  . Smokeless tobacco: Never Used     Comment: 3 cigs a day  . Alcohol Use: No   Current Outpatient Prescriptions  Medication Sig Dispense Refill  . Acetaminophen (TYLENOL EXTRA STRENGTH PO) Take 350 mg by mouth 3 (three) times daily as needed.     . Ascorbic Acid (VITAMIN  C PO) Take by mouth.    . Calcium-Vitamin D-Vitamin K (VIACTIV PO) Take by mouth.    . esomeprazole (NEXIUM) 40 MG capsule Take 1 capsule (40 mg total) by mouth 2 (two) times daily. 120 capsule 3  . fexofenadine (ALLEGRA) 180 MG tablet Take 180 mg by mouth daily.    . IRON PO Take by mouth.    . montelukast (SINGULAIR) 10 MG tablet TAKE 1 TABLET AT BEDTIME 90 tablet 1  . Multiple Vitamins-Calcium (VIACTIV MULTI-VITAMIN) CHEW Chew 1 tablet by mouth 4 (four) times daily.      . Omega-3 Fatty Acids (FISH OIL PO) Take 1 capsule by mouth daily.    [name] PREMARIN 0.3 MG tablet Take 0.3 mg by  mouth daily.     . Na Sulfate-K Sulfate-Mg Sulf SOLN Use as directed per colonoscopy 354 mL 0   No current facility-administered medications for this visit.   Allergies  Allergen Reactions  . Cortisone   . Sulfonamide Derivatives      Review of Systems: All systems reviewed and negative except where noted in HPI.    No results found.  Physical Exam: BP 132/80 mmHg  Pulse 68  Ht 5\' 3"  (1.6 m)  Wt 96 lb 8 oz (43.772 kg)  BMI 17.10 kg/m2 Constitutional: Pleasant,thin female in no acute distress. HEENT: Normocephalic and atraumatic. Conjunctivae are normal. No scleral icterus. Neck supple.  Cardiovascular: Normal rate, regular rhythm.  Pulmonary/chest: Effort normal and breath sounds normal. No wheezing, rales or rhonchi. Abdominal: Soft, mild lower abdominal distension, nontender. Bowel sounds active throughout. There are no masses palpable. No hepatomegaly. Extremities: no edema Lymphadenopathy: No cervical adenopathy noted. Neurological: Alert and oriented to person place and time. Skin: Skin is warm and dry. No rashes noted. Psychiatric: Normal mood and affect. Behavior is normal.   ASSESSMENT AND PLAN: 74 year old female presenting with recurrent iron deficiency anemia. She has had prior upper and lower endoscopies without clear etiology, and appears to respond well to oral iron. She has never had overt GI bleeding. Her small bowel has never been evaluated,  and prior attempt at capsule endoscopy was not been approved by her insurance. Most recently she had an upper endoscopy in July with retained food bolus in the stomach and incomplete exam. Follow-up gastric empty study was normal showing no delayed gastric transit. She otherwise was referred for Cologuard testing in July which returned positive.  We discussed the positive Cologuard test and its implications. I'm recommending a colonoscopy at this time to rule out adenomas polyps or mass lesions. At the same time given her  last upper endoscopy was incomplete exam, I think we should repeat an upper endoscopy as well. If these exams fail to show a clear source for iron deficiency anemia, she needs to have her small bowel evaluated. In this scenario since capsule was not approved I would recommend a CT enterography to ensure no small bowel mass lesion. Given she has had this long-standing and has otherwise been fairly stable, the suspicion for malignancy is low but does need to be ruled out. More likely I suspect she has some sort of vascular lesion in the small bowel that is intermittently bleeding. I would recommend avoidance of NSAIDs in this light.  Finally we discussed that her gastric empty study was normal. For her early satiety recommend eating frequent small meals and will otherwise await results of her EGD. In regards to her abdominal pain, her description and exam findings are most consistent with abdominal  wall pain, however we will await endoscopy evaluation, and as above if this is negative for her bleeding CT scan of the abdomen (enterography) will be performed, and will ensure no other cause of her pain.   The indications, risks, and benefits of EGD and colonoscopy were explained to the patient in detail. Risks include but not limited to bleeding, perforation, adverse reaction to medications, cardiopulmonary compromise. Sequelae include but not limited to need for surgery, hospitalization, blood transfusion, disability, morbidity, and mortality was explained. Patient verbalized understanding and wished to proceed. All questions answered, referred to scheduler and bowel prep provided. Further recommendations pending results of the exam.   Dinosaur Cellar, MD [facility]

## 2015-07-18 NOTE — Patient Instructions (Signed)
You have been scheduled for an endoscopy and flexible sigmoidoscopy. Please follow the written instructions given to you at your visit today. If you use inhalers (even only as needed), please bring them with you on the day of your procedure. Your physician has requested that you go to www.startemmi.com and enter the access code given to you at your visit today. This web site gives a general overview about your procedure. However, you should still follow specific instructions given to you by our office regarding your preparation for the procedure.   We have sent the following medications to your pharmacy for you to pick up at your convenience: Suprep

## 2015-07-22 ENCOUNTER — Ambulatory Visit: Payer: Medicare Other | Admitting: Internal Medicine

## 2015-08-07 ENCOUNTER — Ambulatory Visit (INDEPENDENT_AMBULATORY_CARE_PROVIDER_SITE_OTHER): Payer: Medicare Other | Admitting: Pulmonary Disease

## 2015-08-07 ENCOUNTER — Encounter: Payer: Self-pay | Admitting: Pulmonary Disease

## 2015-08-07 VITALS — BP 110/62 | HR 78 | Temp 97.9°F | Ht 65.0 in | Wt 93.2 lb

## 2015-08-07 DIAGNOSIS — J209 Acute bronchitis, unspecified: Secondary | ICD-10-CM

## 2015-08-07 DIAGNOSIS — J019 Acute sinusitis, unspecified: Secondary | ICD-10-CM | POA: Diagnosis not present

## 2015-08-07 DIAGNOSIS — J301 Allergic rhinitis due to pollen: Secondary | ICD-10-CM | POA: Diagnosis not present

## 2015-08-07 DIAGNOSIS — J479 Bronchiectasis, uncomplicated: Secondary | ICD-10-CM | POA: Diagnosis not present

## 2015-08-07 MED ORDER — AMOXICILLIN-POT CLAVULANATE 875-125 MG PO TABS: 1.0000 | ORAL_TABLET | Freq: Two times a day (BID) | ORAL | Status: DC

## 2015-08-07 NOTE — Patient Instructions (Signed)
Augmentin 1 pill twice per day for 7 days >> if not fully recovered, then refill for an additional 7 days  Call if not feeling better  Follow up in 6 months

## 2015-08-07 NOTE — Progress Notes (Signed)
Chief Complaint  Patient presents with  . Follow-up    pt states she has a lot of congestion and productive cough greysh in color. pt c/o of sore throat, chest tightness, and ear pain to the right side. pt states she has been with these symptoms for 6 dyas and has not gotten any better.    History of Present Illness: [name] is a 74 y.o. female former smoker with bronchiectasis, and recurrent aspiration.  She has prior history of cryptogenic organizing pneumonia.  She developed a cough and chest congestion about a week ago.  She also has sinus congestion with post-nasal drip.  She feels stopped up in her ear Rt > Lt.  She has felt chilled and feverish at times.  She denies hemoptysis, nausea, or diarrhea.  She denies skin rash.  Tests: PFT 12/31/09 >> FEV1 1.84(84%), FEV1% 73, TLC 4.94(98%), DLCO 77%, +BD. CT chest 08/17/10 >> mild biapical scarring, basilar peribronchovascular nodularity and mild bronchiectasis.  PMHx >> GERD, Gastroparesis, Raynaud's, Neuropathy Rt leg, endometriosis  PSHx, Medications, Allergies, Fhx, Shx reviewed.   Physical Exam: BP 110/62 mmHg  Pulse 78  Temp(Src) 97.9 F (36.6 C)  Ht 5\' 5"  (1.651 m)  Wt 93 lb 3.2 oz (42.275 kg)  BMI 15.51 kg/m2  SpO2 98%  General - thin, no distress HEENT - clear to yellow nasal drainage, mild tenderness over frontal and maxillary sinuses, no oral exudate, no LAN, TM clear Cardiac - s1s2 regular, no murmur Chest - scattered rhonchi clear with coughing Abdomen - soft, nontender Extremities - no edema Skin - no rashes Neurologic - normal strength Psychiatric - normal mood, behavior  Assessment/Plan:  Acute bronchitis and sinusitis. Plan: - will give course of augmentin - she does not like using sinus sprays  Bronchiectasis related to prior episodes of PNA and BOOP. Plan: - defer additional testing unless she has symptoms progression - continue singulair and prn albuterol  Allergic rhinitis. Plan: -  continue singulair, allegra   Chesley Mires, MD [facility] 08/07/2015, 2:16 PM Pager:  [phone] After 3pm call: [phone]

## 2015-08-28 ENCOUNTER — Encounter: Payer: Self-pay | Admitting: Gastroenterology

## 2015-08-28 ENCOUNTER — Ambulatory Visit (AMBULATORY_SURGERY_CENTER): Payer: Medicare Other | Admitting: Gastroenterology

## 2015-08-28 VITALS — BP 160/74 | HR 53 | Temp 95.8°F | Resp 15 | Ht 63.0 in | Wt 96.0 lb

## 2015-08-28 DIAGNOSIS — R109 Unspecified abdominal pain: Secondary | ICD-10-CM

## 2015-08-28 DIAGNOSIS — K621 Rectal polyp: Secondary | ICD-10-CM

## 2015-08-28 DIAGNOSIS — K219 Gastro-esophageal reflux disease without esophagitis: Secondary | ICD-10-CM

## 2015-08-28 DIAGNOSIS — D127 Benign neoplasm of rectosigmoid junction: Secondary | ICD-10-CM

## 2015-08-28 DIAGNOSIS — R195 Other fecal abnormalities: Secondary | ICD-10-CM

## 2015-08-28 DIAGNOSIS — D120 Benign neoplasm of cecum: Secondary | ICD-10-CM

## 2015-08-28 DIAGNOSIS — D509 Iron deficiency anemia, unspecified: Secondary | ICD-10-CM

## 2015-08-28 DIAGNOSIS — D129 Benign neoplasm of anus and anal canal: Secondary | ICD-10-CM

## 2015-08-28 DIAGNOSIS — D128 Benign neoplasm of rectum: Secondary | ICD-10-CM

## 2015-08-28 DIAGNOSIS — D123 Benign neoplasm of transverse colon: Secondary | ICD-10-CM

## 2015-08-28 MED ORDER — SODIUM CHLORIDE 0.9 % IV SOLN: 500.0000 mL | INTRAVENOUS | Status: DC

## 2015-08-28 NOTE — Op Note (Signed)
[place]  [address] [place], [address]   ENDOSCOPY PROCEDURE REPORT  PATIENT: Elizabeth, Collins  MR#: [id number] BIRTHDATE: 1940-12-12 , 73  yrs. old GENDER: female ENDOSCOPIST: Yetta Flock, MD REFERRED BY: PROCEDURE DATE:  08/28/2015 PROCEDURE:  EGD w/ biopsy ASA CLASS:     Class II INDICATIONS:  iron deficiency anemia. MEDICATIONS: Propofol 220 mg IV TOPICAL ANESTHETIC:  DESCRIPTION OF PROCEDURE: After the risks benefits and alternatives of the procedure were thoroughly explained, informed consent was obtained.  The LB [id number] endoscope was introduced through the mouth and advanced to the second portion of the duodenum , Without limitations.  The instrument was slowly withdrawn as the mucosa was fully examined.    The esophagus was normal in appearance.  The SCJ was irregular with an extension of a tongue of salmon colored mucosa roughly 1cm in length.  No nodularity appreciated.  Biopsies taken to rule out Barrett's.  DH noted at 39cm from the incisors, with GEJ and SCJ 38cm from the incisors, with a 1cm hiatal hernia.  There was erythematous gastropathy with friability in the gastric cardia / fundus upon entering the stomach.  No focal ulceration or erosion was noted.  Biopsies taken to rule out H pylori.  The remainder of the stomach was normal.  Normal duodenal bulb and 2nd portion of the duodenum.  Biopsies taken to rule out celiac.  Retroflexed views revealed as previously described.     The scope was then withdrawn from the patient and the procedure completed.  COMPLICATIONS: There were no immediate complications.  ENDOSCOPIC IMPRESSION: Irregular SCJ as described, biopsied Erythematous gastropathy in the cardia / fundus, biopsies taken to rule out H pylori Normal duodenum - biopsies taken to rule out celiac disease.  RECOMMENDATIONS: No NSAIDs Continue nexium BID Await pathology results Resume diet Resume  medications    eSigned:  Yetta Flock, MD 08/28/2015 3:54 PM    CC:  PATIENT NAME:  Elizabeth, Collins MR#: [id number]

## 2015-08-28 NOTE — Progress Notes (Signed)
To recovery, report to Brown, RN, VSS. 

## 2015-08-28 NOTE — Patient Instructions (Addendum)

## 2015-08-28 NOTE — Progress Notes (Signed)
Called to room to assist during endoscopic procedure.  Patient ID and intended procedure confirmed with present staff. Received instructions for my participation in the procedure from the performing physician.  

## 2015-08-28 NOTE — Op Note (Signed)
[place]  [address]. [place], [address]   COLONOSCOPY PROCEDURE REPORT  PATIENT: Elizabeth, Collins  MR#: [id number] BIRTHDATE: [birth year] , 73  yrs. old GENDER: female ENDOSCOPIST: Yetta Flock, MD REFERRED BY: PROCEDURE DATE:  08/28/2015 PROCEDURE:   Colonoscopy with snare polypectomy, Colonoscopy with control of bleeding, Colonoscopy with biopsy, and Colonoscopy, diagnostic First Screening Colonoscopy - Avg.  risk and is 50 yrs.  old or older - No.  Prior Negative Screening - Now for repeat screening. Other: See Comments  History of Adenoma - Now for follow-up colonoscopy & has been > or = to 3 yrs.  N/A  Polyps removed today? Yes ASA CLASS:   Class II INDICATIONS:Unexplained iron deficiency anemia and Colorectal Neoplasm Risk Assessment for this procedure is average risk. MEDICATIONS: Propofol 680 mg IV  DESCRIPTION OF PROCEDURE:   After the risks benefits and alternatives of the procedure were thoroughly explained, informed consent was obtained.  The digital rectal exam revealed external hemorrhoids.   The LB [id number]  endoscope was introduced through the anus and advanced to the terminal ileum which was intubated for a short distance. No adverse events experienced. The quality of the prep was adequate  The instrument was then slowly withdrawn as the colon was fully examined. Estimated blood loss is zero unless otherwise noted in this procedure report.  COLON FINDINGS: A 58mm polyp was noted on the IC valve and removed with cold forceps.  2 x 3 mm sessile polyps were noted in the transverse colon and removed with cold forceps.  2 x 71mm polyps were noted in the rectum and removed with cold forceps.  A long pedunculated friable polyp roughly 6-14mm in diameter at the head, but longer stalk, was noted in a narrow turn in the rectosigmoid colon.  It was removed with hot snare however would not cut through the entire stalk despite cautery / cut  technique, and a fibrous band was noted upon opening the snare.  The polyp and fibrous band was removed at the base with cold forceps.  Bleeding occurred after this maneuver.  This part of the colon did not retain air well and visualization was poor.  2 hemostasis clips were placed across the polypectomy base and 6 cc of 1:10,000 epinephrine was injected, with hemostasis achieved, although to place the hemostasis clips took time as this was technically challenging given the location and poor visibility given inability to retain air.  Retroflexed views revealed internal hemorrhoids. The time to cecum = 5.5 Withdrawal time = 63.2   The scope was withdrawn and the procedure completed. COMPLICATIONS: There were no immediate complications.  ENDOSCOPIC IMPRESSION: Multiple polyps removed as above. The largest as described above was not easily removed with snare with fibrous stalk noted. It was able to be removed but bleeding occurred, hemostasis achieved with measures as outlined above. Technically challenging procedure given poor rectal distension (inability to retain air) and location of polyp removed. Internal and external hemorrhoids noted. No bleeding noted at the end of the procedure.  RECOMMENDATIONS: 1.  Hold Aspirin and all other NSAIDS for 2 weeks. 2.  Await pathology results 3.  Resume medications 4.  Resume diet  eSigned:  Yetta Flock, MD 08/28/2015 3:43 PM   cc: the patient   PATIENT NAME:  Elizabeth, Collins MR#: [id number]

## 2015-08-29 ENCOUNTER — Telehealth: Payer: Self-pay | Admitting: *Deleted

## 2015-08-29 NOTE — Telephone Encounter (Signed)
  Follow up Call-  Call back number 08/28/2015 05/28/2015 10/31/2013  Post procedure Call Back phone  # [phone]  Permission to leave phone message Yes Yes Yes     No answer at # given.  Left message on voicemail.

## 2015-09-03 ENCOUNTER — Other Ambulatory Visit: Payer: Self-pay

## 2015-09-03 DIAGNOSIS — D509 Iron deficiency anemia, unspecified: Secondary | ICD-10-CM

## 2015-09-16 ENCOUNTER — Ambulatory Visit (INDEPENDENT_AMBULATORY_CARE_PROVIDER_SITE_OTHER): Payer: Medicare Other | Admitting: Gastroenterology

## 2015-09-16 ENCOUNTER — Encounter: Payer: Self-pay | Admitting: Gastroenterology

## 2015-09-16 VITALS — BP 100/78 | HR 72 | Ht 63.0 in | Wt 93.2 lb

## 2015-09-16 DIAGNOSIS — R1310 Dysphagia, unspecified: Secondary | ICD-10-CM | POA: Diagnosis not present

## 2015-09-16 DIAGNOSIS — K219 Gastro-esophageal reflux disease without esophagitis: Secondary | ICD-10-CM

## 2015-09-16 DIAGNOSIS — D509 Iron deficiency anemia, unspecified: Secondary | ICD-10-CM

## 2015-09-16 NOTE — Patient Instructions (Signed)
You have been scheduled for an esophageal manometry at [facility] on 09/29/2015 at 11:30am. Please arrive 30 minutes prior to your procedure for registration. You will need to go to outpatient registration (1st floor of the hospital) first. Make certain to bring your insurance cards as well as a complete list of medications.  Please remember the following:  1) Nothing to eat or drink after 12:00 midnight on the night before your test.  2) Hold all diabetic medications/insulin the morning of the test. You may eat and take your medications after the test.  3) For 2 days prior to your test, do not take: Reglan, Tagamet, Zantac, Axid or Pepcid.  4) You MAY use an antacid such as Rolaids or Tums up to 12 hours prior to your test.  It will take at least 2 weeks to receive the results of this test from your physician. ------------------------------------------ ABOUT ESOPHAGEAL MANOMETRY Esophageal manometry (muh-NOM-uh-tree) is a test that gauges how well your esophagus works. Your esophagus is the long, muscular tube that connects your throat to your stomach. Esophageal manometry measures the rhythmic muscle contractions (peristalsis) that occur in your esophagus when you swallow. Esophageal manometry also measures the coordination and force exerted by the muscles of your esophagus.  During esophageal manometry, a thin, flexible tube (catheter) that contains sensors is passed through your nose, down your esophagus and into your stomach. Esophageal manometry can be helpful in diagnosing some mostly uncommon disorders that affect your esophagus.  Why it's done Esophageal manometry is used to evaluate the movement (motility) of food through the esophagus and into the stomach. The test measures how well the circular bands of muscle (sphincters) at the top and bottom of your esophagus open and close, as well as the pressure, strength and pattern of the wave of esophageal muscle contractions that  moves food along.  What you can expect Esophageal manometry is an outpatient procedure done without sedation. Most people tolerate it well. You may be asked to change into a hospital gown before the test starts.  During esophageal manometry  While you are sitting up, a member of your health care team sprays your throat with a numbing medication or puts numbing gel in your nose or both.  A catheter is guided through your nose into your esophagus. The catheter may be sheathed in a water-filled sleeve. It doesn't interfere with your breathing. However, your eyes may water, and you may gag. You may have a slight nosebleed from irritation.  After the catheter is in place, you may be asked to lie on your back on an exam table, or you may be asked to remain seated.  You then swallow small sips of water. As you do, a computer connected to the catheter records the pressure, strength and pattern of your esophageal muscle contractions.  During the test, you'll be asked to breathe slowly and smoothly, remain as still as possible, and swallow only when you're asked to do so.  A member of your health care team may move the catheter down into your stomach while the catheter continues its measurements.  The catheter then is slowly withdrawn. The test usually lasts 20 to 30 minutes.  After esophageal manometry  When your esophageal manometry is complete, you may return to your normal activities  This test typically takes 30-45 minutes to complete. ________________________________________________________________________________

## 2015-09-16 NOTE — Progress Notes (Signed)
HPI :  LAST VISIT: 74 year old female here for evaluation for iron deficiency anemia. The patient reports she has had long-standing intermittent iron deficiency. She has had prior upper and lower endoscopies without a clear etiology. Most recently in May 2016 she had a recurrence of mild anemia with hemoglobin of 11.3 but undetectable iron level. After supplementation with oral iron her iron stores normalized, her hemoglobin improved to 12.1. She reports she has not experienced overt GI bleeding historically. She has roughly one bowel movement per day without blood currently. She does endorse some chronic left-sided mid to lower abdominal pain over the past 2 years. She reports the discomfort is usually mild but constant and she is tender in the area. This discomfort can be made worse by sitting or riding a bike, but is not usually changed by eating or having a bowel movement. She endorses a 6 pound weight loss since this past May. She denies vomiting. She is eating okay, although gets full easily and also has a poor appetite. She endorses a history of reflux which is currently well controlled with Nexium. She also endorses a history of intermittent dysphagia which has been attributed to esophageal dysmotility.  Most recently she had an upper endoscopy in July 2016. There was retained food in the stomach and it was a limited exam, although the small bowel appeared normal with normal small bowel biopsies and biopsies were negative for H. pylori. There were normal biopsies of the gastroesophageal junction. Following her upper endoscopy she was referred for a gastric emptying study which was normal and did not show any delayed gastric emptying. Her last colonoscopy was in 2009 and reportedly normal. She was referred for capsule endoscopy to evaluate small bowel however this was not approved by her insurance. She had a cold card testing on July 28 which returned positive, and is here to discuss this  result.  She has a history of BOOP but reports it is stable. No oxygen requirement. No shortness of breath or chest pains today.   SINCE LAST VISIT:  This patient underwent EGD and colonscopy for iron deficiency anemia. She also had a positive cologuard. Her colonoscopy was remarkable for 5 adenomas, all small, but this warrants a repeat surveillance colonoscopy to be done in 3 years. No pathology to cause anemia noted on colonoscopy. Her EGD was remarkable for gastric in the fundus without focal ulceration. Biopsies show reactive gastritis, no evidence of H pylori. Normal duodenum with normal biopsies. The SCJ was irregular but biopsies show no evidence of Barrett's.   It's possible the gastritis could have led to or associated with her anemia, however unclear etiology. She denies NSAID use. She uses tylenol PRN. She is otherwise taking nexium twice daily. She reports the nexium helps her heartburn but she continues to have a lot of heartburn which can bother her. She has also had some ongoing dysphagia. She has dysphagia to solids and liquids. She is not sure if she has ever had a prior manometry procedure. No prior pH study. She continues to have some early satiety. She has no history of bowel obstruction or bowel surgery.   Past Medical History  Diagnosis Date  . GERD (gastroesophageal reflux disease)     severe  . BOOP (bronchiolitis obliterans with organizing pneumonia) ([place])   . Bronchiectasis   . Raynaud's disease   . Pneumonia   . Esophageal dysmotility   . Gastroparesis   . Right lower lobe pneumonia December 2013  . Iron deficiency  anemia   . Allergy   . Arthritis   . Pulmonary fibrosis ([place])   . Neuropathy [facility])      Past Surgical History  Procedure Laterality Date  . Cholecystectomy  2005  . Breast lumpectomy      left, x 2  . Total abdominal hysterectomy    . Lung biopsy      vats right 2003  . Cataract extraction      both eyes  . Colonoscopy    . Upper  gastrointestinal endoscopy     Family History  Problem Relation Age of Onset  . Lymphoma Mother   . Lung cancer Paternal Uncle   . Heart disease Father   . Heart disease Paternal Grandfather   . Colon cancer Neg Hx   . Esophageal cancer Neg Hx   . Rectal cancer Neg Hx   . Stomach cancer Neg Hx   . Leukemia Paternal Grandmother   . Cancer Maternal Grandmother     unknown type   Social History  Substance Use Topics  . Smoking status: Former Smoker    Types: Cigarettes    Quit date: 11/15/1968  . Smokeless tobacco: Never Used     Comment: 3 cigs a day  . Alcohol Use: No   Current Outpatient Prescriptions  Medication Sig Dispense Refill  . Acetaminophen (TYLENOL EXTRA STRENGTH PO) Take 350 mg by mouth 3 (three) times daily as needed.     . Calcium-Vitamin D-Vitamin K (VIACTIV PO) Take by mouth.    . esomeprazole (NEXIUM) 40 MG capsule Take 1 capsule (40 mg total) by mouth 2 (two) times daily. 120 capsule 3  . fexofenadine (ALLEGRA) 180 MG tablet Take 180 mg by mouth daily.    . montelukast (SINGULAIR) 10 MG tablet TAKE 1 TABLET AT BEDTIME 90 tablet 1  . Multiple Vitamins-Calcium (VIACTIV MULTI-VITAMIN) CHEW Chew 1 tablet by mouth 4 (four) times daily.      . Omega-3 Fatty Acids (FISH OIL PO) Take 1 capsule by mouth daily.    [name] PREMARIN 0.3 MG tablet Take 0.3 mg by mouth daily.      No current facility-administered medications for this visit.   Allergies  Allergen Reactions  . Cortisone     unsure  . Prednisone   . Sulfonamide Derivatives     Unsure of reaction     Review of Systems: All systems reviewed and negative except where noted in HPI.   Lab Results  Component Value Date   WBC 8.4 05/27/2015   HGB 12.1 05/27/2015   HCT 38.3 05/27/2015   MCV 86.1 05/27/2015   PLT 471.0* 05/27/2015      Iron studies canned into chart from earlier this year 03/2015, ferritin < 10  Physical Exam: BP 100/78 mmHg  Pulse 72  Ht 5\' 3"  (1.6 m)  Wt 93 lb 4 oz (42.298 kg)   BMI 16.52 kg/m2 Constitutional: Pleasant,well-developed, female in no acute distress. HEENT: Normocephalic and atraumatic. Conjunctivae are normal. No scleral icterus. Neck supple.  Cardiovascular: Normal rate, regular rhythm.  Pulmonary/chest: Effort normal and breath sounds normal. No wheezing, rales or rhonchi. Abdominal: Soft, nondistended, scaphoid abdomen, nontender. Bowel sounds active throughout. There are no masses palpable. No hepatomegaly. Extremities: no edema Lymphadenopathy: No cervical adenopathy noted. Neurological: Alert and oriented to person place and time. Skin: Skin is warm and dry. No rashes noted. Psychiatric: Normal mood and affect. Behavior is normal.   ASSESSMENT AND PLAN: 74 year old female presenting with recurrent iron deficiency anemia. She  has had prior upper and lower endoscopies without clear etiology, and appears to respond well to oral iron. She has never had overt GI bleeding. She had a positive Cologuard at the last visit with recurrence of anemia thus she had another EGD and colonoscopy as above. She had 5 adenomas accounting for positive cologuard but no pathology to cause her anemia. EGD showed some nonspecific gastritis, H pylori negative, but otherwise no clear etiology for her anemia. Her small bowel has never been evaluated, and prior attempt at capsule endoscopy was not been approved by her insurance. We have looked into this again and she is approved for a capsule study. I have discussed risks / benefits of this with her to include capsule retention and risk of obstruction, she wished to proceed.  Otherwise in light of her ongoing dysphagia and heartburn despite PPI, I offered her an esophageal manometry to more formally assess for dysmotility (suspect this is the etiology) and to see if she has nonacid reflux, hypersensitive esophagus, or functional heartburn driving her reflux symptoms. After a discussion of these procedures she wished to proceed.  Recommend it be on BID PPI. Will contact her with the results.   Tuluksak Cellar, MD [facility] Pager [phone]

## 2015-09-18 ENCOUNTER — Other Ambulatory Visit: Payer: Self-pay

## 2015-09-18 DIAGNOSIS — K219 Gastro-esophageal reflux disease without esophagitis: Secondary | ICD-10-CM

## 2015-09-18 DIAGNOSIS — R1310 Dysphagia, unspecified: Secondary | ICD-10-CM

## 2015-09-18 DIAGNOSIS — D649 Anemia, unspecified: Secondary | ICD-10-CM

## 2015-09-21 ENCOUNTER — Other Ambulatory Visit: Payer: Self-pay | Admitting: Pulmonary Disease

## 2015-10-01 ENCOUNTER — Telehealth: Payer: Self-pay | Admitting: Gastroenterology

## 2015-10-01 NOTE — Telephone Encounter (Signed)
Left a message for patient to call back. 

## 2015-10-02 NOTE — Telephone Encounter (Signed)
Patient notified that the powder is correct.

## 2015-10-06 ENCOUNTER — Ambulatory Visit (HOSPITAL_COMMUNITY)
Admission: RE | Admit: 2015-10-06 | Discharge: 2015-10-06 | Disposition: A | Discharge status: Home / Self Care | Payer: Medicare Other | Source: Ambulatory Visit | Attending: Gastroenterology | Admitting: Gastroenterology

## 2015-10-06 ENCOUNTER — Encounter (HOSPITAL_COMMUNITY)
Admission: RE | Disposition: A | Discharge status: Home / Self Care | Payer: Self-pay | Source: Ambulatory Visit | Attending: Gastroenterology

## 2015-10-06 DIAGNOSIS — R1310 Dysphagia, unspecified: Secondary | ICD-10-CM | POA: Insufficient documentation

## 2015-10-06 HISTORY — PX: ESOPHAGEAL MANOMETRY: SHX5429

## 2015-10-06 SURGERY — MANOMETRY, ESOPHAGUS

## 2015-10-06 MED ORDER — LIDOCAINE VISCOUS 2 % MT SOLN
OROMUCOSAL | Status: AC
  Filled 2015-10-06: qty 15

## 2015-10-06 SURGICAL SUPPLY — 2 items
FACESHIELD LNG OPTICON STERILE (SAFETY) IMPLANT
GLOVE BIO SURGEON STRL SZ8 (GLOVE) ×4 IMPLANT

## 2015-10-07 ENCOUNTER — Encounter (HOSPITAL_COMMUNITY): Payer: Self-pay | Admitting: Gastroenterology

## 2015-10-07 ENCOUNTER — Ambulatory Visit (INDEPENDENT_AMBULATORY_CARE_PROVIDER_SITE_OTHER): Payer: Medicare Other | Admitting: Gastroenterology

## 2015-10-07 DIAGNOSIS — D509 Iron deficiency anemia, unspecified: Secondary | ICD-10-CM

## 2015-10-07 NOTE — Progress Notes (Signed)
Pt here for capsule endo. Pt completed prep with no problems. Pt tolerated procedure well. [id number] Exp:  10/2016

## 2015-10-13 ENCOUNTER — Encounter (HOSPITAL_COMMUNITY)
Admission: RE | Disposition: A | Discharge status: Home / Self Care | Payer: Self-pay | Source: Ambulatory Visit | Attending: Internal Medicine

## 2015-10-13 ENCOUNTER — Ambulatory Visit (HOSPITAL_COMMUNITY)
Admission: RE | Admit: 2015-10-13 | Discharge: 2015-10-13 | Disposition: A | Discharge status: Home / Self Care | Payer: Medicare Other | Source: Ambulatory Visit | Attending: Internal Medicine | Admitting: Internal Medicine

## 2015-10-13 DIAGNOSIS — K219 Gastro-esophageal reflux disease without esophagitis: Secondary | ICD-10-CM | POA: Diagnosis not present

## 2015-10-13 DIAGNOSIS — K224 Dyskinesia of esophagus: Secondary | ICD-10-CM | POA: Insufficient documentation

## 2015-10-13 DIAGNOSIS — R079 Chest pain, unspecified: Secondary | ICD-10-CM | POA: Diagnosis not present

## 2015-10-13 DIAGNOSIS — R1310 Dysphagia, unspecified: Secondary | ICD-10-CM | POA: Insufficient documentation

## 2015-10-13 HISTORY — PX: 24 HOUR PH STUDY: SHX5419

## 2015-10-13 SURGERY — MONITORING, ESOPHAGEAL PH, 24 HOUR

## 2015-10-15 ENCOUNTER — Other Ambulatory Visit: Payer: Self-pay

## 2015-10-15 ENCOUNTER — Telehealth: Payer: Self-pay | Admitting: Gastroenterology

## 2015-10-15 DIAGNOSIS — T184XXD Foreign body in colon, subsequent encounter: Secondary | ICD-10-CM

## 2015-10-15 NOTE — Telephone Encounter (Signed)
Left message for pt to call back. Order in epic for KUB.

## 2015-10-16 ENCOUNTER — Encounter (HOSPITAL_COMMUNITY): Payer: Self-pay | Admitting: Internal Medicine

## 2015-10-16 NOTE — Telephone Encounter (Signed)
Pt will come for KUB to see if capsule has passed. States she may come tomorrow or Monday.

## 2015-10-17 ENCOUNTER — Ambulatory Visit (INDEPENDENT_AMBULATORY_CARE_PROVIDER_SITE_OTHER)
Admission: RE | Admit: 2015-10-17 | Discharge: 2015-10-17 | Disposition: A | Discharge status: Home / Self Care | Payer: Medicare Other | Source: Ambulatory Visit | Attending: Gastroenterology | Admitting: Gastroenterology

## 2015-10-17 DIAGNOSIS — T184XXA Foreign body in colon, initial encounter: Secondary | ICD-10-CM

## 2015-10-17 DIAGNOSIS — T184XXD Foreign body in colon, subsequent encounter: Secondary | ICD-10-CM

## 2015-10-23 ENCOUNTER — Encounter (HOSPITAL_COMMUNITY): Payer: Self-pay | Admitting: Internal Medicine

## 2015-10-27 ENCOUNTER — Telehealth: Payer: Self-pay | Admitting: Gastroenterology

## 2015-10-27 DIAGNOSIS — D509 Iron deficiency anemia, unspecified: Secondary | ICD-10-CM

## 2015-10-27 NOTE — Telephone Encounter (Signed)
Patient had a capsule endoscopy done a few weeks ago which was normal, other than a diminutive red spot without any likely clinical significance. I don't see a clear etiology for her anemia on EGD, colonoscopy, or capsule. Recommend she continue oral iron for now as she has responded to it, and would like to repeat a CBC now to ensure normal, her last was several months ago. Can you please let her know I we will contact her with results of CBC. Thanks

## 2015-10-28 ENCOUNTER — Telehealth: Payer: Self-pay | Admitting: Gastroenterology

## 2015-10-28 ENCOUNTER — Encounter: Payer: Self-pay | Admitting: Gastroenterology

## 2015-10-28 DIAGNOSIS — K224 Dyskinesia of esophagus: Secondary | ICD-10-CM

## 2015-10-28 DIAGNOSIS — R1310 Dysphagia, unspecified: Secondary | ICD-10-CM | POA: Insufficient documentation

## 2015-10-28 MED ORDER — DILTIAZEM HCL 30 MG PO TABS
30.0000 mg | ORAL_TABLET | Freq: Three times a day (TID) | ORAL | Status: DC
Start: 2015-10-28 — End: 2016-05-07

## 2015-10-28 NOTE — Telephone Encounter (Signed)
Lab in EPIC. Left a message for patient to call back. 

## 2015-10-28 NOTE — Telephone Encounter (Signed)
I called the patient with the results of esophageal manometry (to be scanned into Epic). 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 I discussed with her what this is. Recommend a trial of diltiazem 60 mg TID to start and see if this helps. She reports she has been very sensitive to blood pressure medications it the past and wanted to start at the lowest dose possible first to ensure she tolerates it. In this light will try 30mg  TID. She will start slow and titrate up. If she tolerates it, and has benefit she will continue at this dose. If no benefit, may need to increase dose over time. She has follow up with me scheduled in the next 1-2 months for reassessment. All questions answered.

## 2015-10-28 NOTE — Telephone Encounter (Signed)
Patient notified and she will come for lab. 

## 2015-10-30 ENCOUNTER — Other Ambulatory Visit (INDEPENDENT_AMBULATORY_CARE_PROVIDER_SITE_OTHER): Payer: Medicare Other

## 2015-10-30 DIAGNOSIS — D509 Iron deficiency anemia, unspecified: Secondary | ICD-10-CM | POA: Diagnosis not present

## 2015-10-30 LAB — CBC WITH DIFFERENTIAL/PLATELET
Basophils Absolute: 0 10*3/uL (ref 0.0–0.1)
Basophils Relative: 0.3 % (ref 0.0–3.0)
EOS PCT: 0.4 % (ref 0.0–5.0)
Eosinophils Absolute: 0.1 10*3/uL (ref 0.0–0.7)
HEMATOCRIT: 38 % (ref 36.0–46.0)
HEMOGLOBIN: 12.1 g/dL (ref 12.0–15.0)
Lymphocytes Relative: 9.5 % — ABNORMAL LOW (ref 12.0–46.0)
Lymphs Abs: 1.6 10*3/uL (ref 0.7–4.0)
MCHC: 31.8 g/dL (ref 30.0–36.0)
MCV: 87.7 fl (ref 78.0–100.0)
MONOS PCT: 6.9 % (ref 3.0–12.0)
Monocytes Absolute: 1.1 10*3/uL — ABNORMAL HIGH (ref 0.1–1.0)
Neutro Abs: 13.6 10*3/uL — ABNORMAL HIGH (ref 1.4–7.7)
Neutrophils Relative %: 82.9 % — ABNORMAL HIGH (ref 43.0–77.0)
Platelets: 500 10*3/uL — ABNORMAL HIGH (ref 150.0–400.0)
RBC: 4.33 Mil/uL (ref 3.87–5.11)
RDW: 15.4 % (ref 11.5–15.5)
WBC: 16.5 10*3/uL — AB (ref 4.0–10.5)

## 2015-10-31 ENCOUNTER — Telehealth: Payer: Self-pay | Admitting: Pulmonary Disease

## 2015-10-31 MED ORDER — BENZONATATE 200 MG PO CAPS: 200.0000 mg | ORAL_CAPSULE | Freq: Three times a day (TID) | ORAL | Status: DC | PRN

## 2015-10-31 MED ORDER — DOXYCYCLINE HYCLATE 100 MG PO TABS: 100.0000 mg | ORAL_TABLET | Freq: Two times a day (BID) | ORAL | Status: DC

## 2015-10-31 NOTE — Telephone Encounter (Signed)
Called spoke with pt regarding recs below. She refuses the prednisone. I have sent in doxycyline and tess pearls. Nothing further needed

## 2015-10-31 NOTE — Telephone Encounter (Signed)
Called spoke with pt. C/o chest cong, prod cough (grey phlem), wheezing, chest tx. She has not taken anything for symptoms. Pt wants something called in and also wants tess pearls called in. Please advise Dr. Halford Chessman thanks

## 2015-10-31 NOTE — Telephone Encounter (Signed)
Call in script for doxycycline 100 mg pill, 1 pill bid, dispense 14 with no refills.  Call in script for prednisone 10 mg pills >> 3 pills daily for 2 days, 2 pills daily for 2 days, 1 pill daily for 2 days, dispense 12 pills with no refills.  Call in script for tessalon 200 mg, 1 pill tid prn cough, dispense 60 pills with 1 refill.

## 2015-11-18 ENCOUNTER — Ambulatory Visit: Payer: Self-pay

## 2015-11-19 ENCOUNTER — Ambulatory Visit (INDEPENDENT_AMBULATORY_CARE_PROVIDER_SITE_OTHER): Payer: Medicare Other

## 2015-11-19 DIAGNOSIS — Z23 Encounter for immunization: Secondary | ICD-10-CM | POA: Diagnosis not present

## 2015-12-15 ENCOUNTER — Telehealth: Payer: Self-pay | Admitting: Gastroenterology

## 2015-12-15 NOTE — Telephone Encounter (Signed)
[name] can you please let the patient know the results of her 24 HR pH impedance study:  Results: - Study time of 24 hrs and 0 minutes: adequate study time - Demeester score of 152.7 (14.72 = upper limit of normal)  - Symptom index of only 22.9% for reflux - most of reflux episodes were weakly acidic (<50%) - only 3.8% of reflux episodes reached the proximal esophagus  Summary and Interpretation: - markedly elevated Demeester score ON PPI, with significant weakly acidic reflux - low symptom index, most symptoms did not correlate with reflux episodes   Recommend: - consider switch to a different PPI. She is currently on nexium 40mg  BID, can you please confirm this with her. IF so, would recommend a switch to Dexilant 60mg  daily and see if this helps. We can also consider addition of baclofen in the future, however, patient also has nutcracker esophagus and will assess response to addition of calcium channel blocker first. I will see her in clinic for reassessment. Thanks

## 2015-12-15 NOTE — Telephone Encounter (Signed)
Left a message for patient to call back. 

## 2015-12-16 MED ORDER — DEXLANSOPRAZOLE 60 MG PO CPDR: 60.0000 mg | DELAYED_RELEASE_CAPSULE | Freq: Every day | ORAL | Status: DC

## 2015-12-16 NOTE — Telephone Encounter (Signed)
Spoke with patient and gave her results and recommendations. She has been taking Nexium 40 mg BID. Rx for Dexilant sent to pharmacy. She has an OV scheduled already.

## 2016-01-01 ENCOUNTER — Encounter: Payer: Self-pay | Admitting: Gastroenterology

## 2016-01-01 ENCOUNTER — Ambulatory Visit (INDEPENDENT_AMBULATORY_CARE_PROVIDER_SITE_OTHER): Payer: Medicare Other | Admitting: Gastroenterology

## 2016-01-01 VITALS — BP 118/80 | HR 80 | Ht 63.0 in | Wt 97.8 lb

## 2016-01-01 DIAGNOSIS — K224 Dyskinesia of esophagus: Secondary | ICD-10-CM | POA: Diagnosis not present

## 2016-01-01 DIAGNOSIS — K219 Gastro-esophageal reflux disease without esophagitis: Secondary | ICD-10-CM

## 2016-01-01 DIAGNOSIS — D509 Iron deficiency anemia, unspecified: Secondary | ICD-10-CM

## 2016-01-01 NOTE — Progress Notes (Signed)
HPI :  INTAKE VISIT: 75 year old female here for evaluation for iron deficiency anemia. The patient reports she has had long-standing intermittent iron deficiency. She has had prior upper and lower endoscopies without a clear etiology. Most recently in May 2016 she had a recurrence of mild anemia with hemoglobin of 11.3 but undetectable iron level. After supplementation with oral iron her iron stores normalized, her hemoglobin improved to 12.1. She reports she has not experienced overt GI bleeding historically. She has roughly one bowel movement per day without blood currently. She does endorse some chronic left-sided mid to lower abdominal pain over the past 2 years. She reports the discomfort is usually mild but constant and she is tender in the area. This discomfort can be made worse by sitting or riding a bike, but is not usually changed by eating or having a bowel movement. She endorses a 6 pound weight loss since this past May. She denies vomiting. She is eating okay, although gets full easily and also has a poor appetite. She endorses a history of reflux which is currently well controlled with Nexium. She also endorses a history of intermittent dysphagia which has been attributed to esophageal dysmotility.  Most recently she had an upper endoscopy in July 2016. There was retained food in the stomach and it was a limited exam, although the small bowel appeared normal with normal small bowel biopsies and biopsies were negative for H. pylori. There were normal biopsies of the gastroesophageal junction. Following her upper endoscopy she was referred for a gastric emptying study which was normal and did not show any delayed gastric emptying. Her last colonoscopy was in 2009 and reportedly normal. She was referred for capsule endoscopy to evaluate small bowel however this was not approved by her insurance. She had a cold card testing on July 28 which returned positive, and is here to discuss this  result.  She has a history of BOOP but reports it is stable. No oxygen requirement. No shortness of breath or chest pains today.   LAST VISIT:  This patient underwent EGD and colonscopy for iron deficiency anemia. She also had a positive cologuard. Her colonoscopy was remarkable for 5 adenomas, all small, but this warrants a repeat surveillance colonoscopy to be done in 3 years. No pathology to cause anemia noted on colonoscopy. Her EGD was remarkable for gastric in the fundus without focal ulceration. Biopsies show reactive gastritis, no evidence of H pylori. Normal duodenum with normal biopsies. The SCJ was irregular but biopsies show no evidence of Barrett's.   It's possible the gastritis could have led to or associated with her anemia, however unclear etiology. She denies NSAID use. She uses tylenol PRN. She is otherwise taking nexium twice daily. She reports the nexium helps her heartburn but she continues to have a lot of heartburn which can bother her. She has also had some ongoing dysphagia. She has dysphagia to solids and liquids. She is not sure if she has ever had a prior manometry procedure. No prior pH study. She continues to have some early satiety. She has no history of bowel obstruction or bowel surgery.  SINCE LAST VISIT:  She has since been off oral iron for a few months at least. Her last CBC was in December and showed a normal Hgb. She is doing well in this regard. She has had a capsule endoscopy since the last visit which other than a diminutive red spot was normal. No pathology noted to have caused her anemia.  She has otherwise had a esophageal manometry showing Nutcracker esophagus and had a 24 HR pH study with a Demeester score of 152.  Since our last visit we discussed starting Diltiazem for her recently Nutcracker esophagus which was diagnosed on manometry. She reports she did not start diltiazem as she is fearful to use BP medictions and some side effects. She does have  some dysphagia, she thinks she has this with most meals, 4-6 times per day. She has some discomfort in her chest with swallows as well which can bother her. She otherwise had a 24 HR pH test for reflux and her Demeester was 152 on 40mg  BID omeprazole. She was switched to [place] - taking one tablet per day. She thinks this has helped her hearburn which is not as bad but she continues to have some extent of "burning" in her chest. She has not taken anything like gaviscon or TUMS. She is otherwise taking vinegar and was asked to stop. She is otherwise feeling well without complaints today.    Esophageal manometry results in Epic procedure section, consistent with Nutcracker esophagus, average DCI in 6000s.   24 HR pH impedance study: Results: - Study time of 24 hrs and 0 minutes: adequate study time - Demeester score of 152.7 (14.72 = upper limit of normal)  - Symptom index of only 22.9% for reflux - most of reflux episodes were weakly acidic (<50%) - only 3.8% of reflux episodes reached the proximal esophagus  Summary and Interpretation: - markedly elevated Demeester score ON PPI, with significant weakly acidic reflux - low symptom index, most symptoms did not correlate with reflux episodes  Past Medical History  Diagnosis Date  . GERD (gastroesophageal reflux disease)     severe  . BOOP (bronchiolitis obliterans with organizing pneumonia) ([place])   . Bronchiectasis   . Raynaud's disease   . Pneumonia   . Esophageal dysmotility   . Gastroparesis   . Right lower lobe pneumonia December 2013  . Iron deficiency anemia   . Allergy   . Arthritis   . Pulmonary fibrosis ([place])   . Neuropathy ([place])   . Nutcracker esophagus      Past Surgical History  Procedure Laterality Date  . Cholecystectomy  2005  . Breast lumpectomy      left, x 2  . Total abdominal hysterectomy    . Lung biopsy      vats right 2003  . Cataract extraction      both eyes  . Colonoscopy    . Upper  gastrointestinal endoscopy    . Esophageal manometry N/A 10/06/2015    Procedure: ESOPHAGEAL MANOMETRY (EM);  Surgeon: Manus Gunning, MD;  Location: [facility];  Service: Gastroenterology;  Laterality: N/A;  . 24 hour ph study N/A 10/13/2015    Procedure: [place] STUDY;  Surgeon: Gatha Mayer, MD;  Location: [facility];  Service: Endoscopy;  Laterality: N/A;   Family History  Problem Relation Age of Onset  . Lymphoma Mother   . Lung cancer Paternal Uncle   . Heart disease Father   . Heart disease Paternal Grandfather   . Colon cancer Neg Hx   . Esophageal cancer Neg Hx   . Rectal cancer Neg Hx   . Stomach cancer Neg Hx   . Leukemia Paternal Grandmother   . Cancer Maternal Grandmother     unknown type   Social History  Substance Use Topics  . Smoking status: Former Smoker    Types: Cigarettes  Quit date: 11/15/1968  . Smokeless tobacco: Never Used     Comment: 3 cigs a day  . Alcohol Use: No   Current Outpatient Prescriptions  Medication Sig Dispense Refill  . Acetaminophen (TYLENOL EXTRA STRENGTH PO) Take 350 mg by mouth 3 (three) times daily as needed.     . benzonatate (TESSALON) 200 MG capsule Take 1 capsule (200 mg total) by mouth 3 (three) times daily as needed for cough. 60 capsule 1  . Calcium-Vitamin D-Vitamin K (VIACTIV PO) Take by mouth.    . dexlansoprazole (DEXILANT) 60 MG capsule Take 1 capsule (60 mg total) by mouth daily. 90 capsule 3  . diltiazem (CARDIZEM) 30 MG tablet Take 1 tablet (30 mg total) by mouth 3 (three) times daily. 90 tablet 1  . doxycycline (VIBRA-TABS) 100 MG tablet Take 1 tablet (100 mg total) by mouth 2 (two) times daily. 14 tablet 0  . fexofenadine (ALLEGRA) 180 MG tablet Take 180 mg by mouth daily.    . montelukast (SINGULAIR) 10 MG tablet TAKE 1 TABLET AT BEDTIME 90 tablet 1  . Multiple Vitamins-Calcium (VIACTIV MULTI-VITAMIN) CHEW Chew 1 tablet by mouth 4 (four) times daily.      . Omega-3 Fatty Acids (FISH OIL PO) Take  1 capsule by mouth daily.    [name] PREMARIN 0.3 MG tablet Take 0.3 mg by mouth daily.      No current facility-administered medications for this visit.   Allergies  Allergen Reactions  . Cortisone     unsure  . Prednisone   . Sulfonamide Derivatives     Unsure of reaction     Review of Systems: All systems reviewed and negative except where noted in HPI.   Lab Results  Component Value Date   WBC 16.5* 10/30/2015   HGB 12.1 10/30/2015   HCT 38.0 10/30/2015   MCV 87.7 10/30/2015   PLT 500.0* 10/30/2015   Lab Results  Component Value Date   CREATININE 0.7 08/14/2010   BUN 19 08/14/2010   NA 139 08/14/2010   K 5.1 08/14/2010   CL 100 08/14/2010   CO2 32 08/14/2010      Physical Exam: BP 118/80 mmHg  Pulse 80  Ht 5\' 3"  (1.6 m)  Wt 97 lb 12.8 oz (44.362 kg)  BMI 17.33 kg/m2 Constitutional: Pleasant, thin appearing female in no acute distress. HEENT: Normocephalic and atraumatic. Conjunctivae are normal. No scleral icterus. Neck supple.  Cardiovascular: Normal rate, regular rhythm.  Pulmonary/chest: Effort normal and breath sounds normal. No wheezing, rales or rhonchi. Abdominal: Soft, nondistended, nontender. Bowel sounds active throughout. There are no masses palpable. No hepatomegaly. Extremities: no edema Lymphadenopathy: No cervical adenopathy noted. Neurological: Alert and oriented to person place and time. Skin: Skin is warm and dry. No rashes noted. Psychiatric: Normal mood and affect. Behavior is normal.   ASSESSMENT AND PLAN: 75 year old female presenting with recurrent iron deficiency anemia, GERD, dysphagia and chest pains. Issues addressed as below:  Iron deficiency anemia: prior upper and lower endoscopies without clear etiology for her anemia, and appears to respond well to oral iron. Capsule study done without clear etiology. She has never had overt GI bleeding. She has responded well to iron supplementation however stopped it a few months ago. Will  repeat CBC to ensure Hgb is stable, if she has had recurrence of anemia will need to place back on oral iron. No evidence of malignancy I would hold off on further evaluation at this time if she responds well to oral iron  without overt GI bleeding  Nutcracker esophagus - the likely cause of her dysphagia and chest discomfort, noted on manometry. I had recommended a trial of calcium channel blocker however she wished to hold off if at all possible as she is concerned about side effects. I offered to start her on a low dose but she asked about alternatives. She can try taking peppermint althoids a few times per day and as needed for chest discomfort and see if this helps. If no improved she will try diltiazem and follow up as needed for this.   GERD - markedly elevated Demeester score on 40mg  omeprazole BID, although most of reflux was weakly acidic or nonacidic. I have placed her on Dexilant and she is responding better to this regimen. She will continue it for now as it has provided benefit and she has severe symptoms off PPI. She will also add gaviscon PRN. We may consider baclofen pending her course. I otherwise counseled her on PPI use. The risks of long term PPIs with current data include increased risk for chronic kidney disease, increased risk of fracture, increased risk of C diff, increased risk of pneumonia (short term usage), potentially increased risk of B12 / calcium deficiency, and rare risk of hypomagnesemia. Recent studies have also shown an association with increased risk of dementia and cardiovascular outcomes including stroke. These studies have showed an association between PPIs and several of these outcomes but no evidence of causality. The patient was counseled to use the lowest daily use of PPI needed to control symptoms. Renal function is currently normal and would recommend checking this yearly while on PPI therapy.   Maize Cellar, MD [facility]astroenterology Pager  [phone]

## 2016-01-01 NOTE — Patient Instructions (Signed)
Your physician has requested that you go to the basement for the following lab work before leaving today: CBC   Thank you for choosing [facility]  Dr Hawthorne Cellar

## 2016-01-02 ENCOUNTER — Other Ambulatory Visit (INDEPENDENT_AMBULATORY_CARE_PROVIDER_SITE_OTHER): Payer: Self-pay

## 2016-01-02 DIAGNOSIS — D509 Iron deficiency anemia, unspecified: Secondary | ICD-10-CM

## 2016-01-02 LAB — CBC WITH DIFFERENTIAL/PLATELET
BASOS PCT: 0.5 % (ref 0.0–3.0)
Basophils Absolute: 0 10*3/uL (ref 0.0–0.1)
Eosinophils Absolute: 0.1 10*3/uL (ref 0.0–0.7)
Eosinophils Relative: 1.2 % (ref 0.0–5.0)
HEMATOCRIT: 35.5 % — AB (ref 36.0–46.0)
Hemoglobin: 11.4 g/dL — ABNORMAL LOW (ref 12.0–15.0)
LYMPHS ABS: 2 10*3/uL (ref 0.7–4.0)
LYMPHS PCT: 23.4 % (ref 12.0–46.0)
MCHC: 32.1 g/dL (ref 30.0–36.0)
MCV: 84.1 fl (ref 78.0–100.0)
MONOS PCT: 8 % (ref 3.0–12.0)
Monocytes Absolute: 0.7 10*3/uL (ref 0.1–1.0)
NEUTROS ABS: 5.7 10*3/uL (ref 1.4–7.7)
NEUTROS PCT: 66.9 % (ref 43.0–77.0)
PLATELETS: 471 10*3/uL — AB (ref 150.0–400.0)
RBC: 4.23 Mil/uL (ref 3.87–5.11)
RDW: 16.5 % — AB (ref 11.5–15.5)
WBC: 8.6 10*3/uL (ref 4.0–10.5)

## 2016-01-05 ENCOUNTER — Other Ambulatory Visit: Payer: Self-pay | Admitting: *Deleted

## 2016-01-05 DIAGNOSIS — D509 Iron deficiency anemia, unspecified: Secondary | ICD-10-CM

## 2016-01-28 ENCOUNTER — Ambulatory Visit: Payer: Self-pay | Admitting: Pulmonary Disease

## 2016-02-02 ENCOUNTER — Other Ambulatory Visit (INDEPENDENT_AMBULATORY_CARE_PROVIDER_SITE_OTHER): Payer: Medicare Other

## 2016-02-02 DIAGNOSIS — D509 Iron deficiency anemia, unspecified: Secondary | ICD-10-CM

## 2016-02-02 LAB — CBC WITH DIFFERENTIAL/PLATELET
BASOS ABS: 0 10*3/uL (ref 0.0–0.1)
BASOS PCT: 0.5 % (ref 0.0–3.0)
EOS ABS: 0.1 10*3/uL (ref 0.0–0.7)
Eosinophils Relative: 1.5 % (ref 0.0–5.0)
HEMATOCRIT: 34.3 % — AB (ref 36.0–46.0)
HEMOGLOBIN: 11 g/dL — AB (ref 12.0–15.0)
LYMPHS PCT: 21.2 % (ref 12.0–46.0)
Lymphs Abs: 1.7 10*3/uL (ref 0.7–4.0)
MCHC: 32.1 g/dL (ref 30.0–36.0)
MCV: 83.7 fl (ref 78.0–100.0)
MONO ABS: 0.7 10*3/uL (ref 0.1–1.0)
Monocytes Relative: 7.9 % (ref 3.0–12.0)
NEUTROS PCT: 68.9 % (ref 43.0–77.0)
Neutro Abs: 5.7 10*3/uL (ref 1.4–7.7)
PLATELETS: 433 10*3/uL — AB (ref 150.0–400.0)
RBC: 4.09 Mil/uL (ref 3.87–5.11)
RDW: 17.1 % — ABNORMAL HIGH (ref 11.5–15.5)
WBC: 8.2 10*3/uL (ref 4.0–10.5)

## 2016-02-10 ENCOUNTER — Other Ambulatory Visit: Payer: Self-pay | Admitting: *Deleted

## 2016-02-10 MED ORDER — MONTELUKAST SODIUM 10 MG PO TABS: 10.0000 mg | ORAL_TABLET | Freq: Every day | ORAL | Status: DC

## 2016-02-22 ENCOUNTER — Other Ambulatory Visit: Payer: Self-pay | Admitting: Internal Medicine

## 2016-02-22 ENCOUNTER — Telehealth: Payer: Self-pay | Admitting: Internal Medicine

## 2016-02-22 MED ORDER — DOXYCYCLINE HYCLATE 100 MG PO TABS: 100.0000 mg | ORAL_TABLET | Freq: Two times a day (BID) | ORAL | Status: DC

## 2016-02-22 NOTE — Telephone Encounter (Signed)
On call- Flu-like illness with chest congestion, gray sputum x 5 days.  Plan- doxycycline Rx and recommended Mucinex-DM. Call office during week if no better.

## 2016-02-22 NOTE — Telephone Encounter (Signed)
Answering machine. 

## 2016-05-07 ENCOUNTER — Telehealth: Payer: Self-pay | Admitting: Pulmonary Disease

## 2016-05-07 ENCOUNTER — Ambulatory Visit (INDEPENDENT_AMBULATORY_CARE_PROVIDER_SITE_OTHER): Payer: Medicare Other | Admitting: Pulmonary Disease

## 2016-05-07 ENCOUNTER — Encounter: Payer: Self-pay | Admitting: Pulmonary Disease

## 2016-05-07 ENCOUNTER — Ambulatory Visit (INDEPENDENT_AMBULATORY_CARE_PROVIDER_SITE_OTHER)
Admission: RE | Admit: 2016-05-07 | Discharge: 2016-05-07 | Disposition: A | Discharge status: Home / Self Care | Payer: Medicare Other | Source: Ambulatory Visit | Attending: Pulmonary Disease | Admitting: Pulmonary Disease

## 2016-05-07 VITALS — BP 102/70 | HR 66 | Ht 64.0 in | Wt 93.2 lb

## 2016-05-07 DIAGNOSIS — J479 Bronchiectasis, uncomplicated: Secondary | ICD-10-CM

## 2016-05-07 NOTE — Patient Instructions (Signed)
Chest xray today Will schedule pulmonary function test  Follow up in 6 months

## 2016-05-07 NOTE — Telephone Encounter (Signed)
Dg Chest 2 View  05/07/2016  CLINICAL DATA:  Shortness of breath. Pulmonary fibrosis. Bronchiectasis. EXAM: CHEST  2 VIEW COMPARISON:  11/06/2012 FINDINGS: The heart size and mediastinal contours are within normal limits. Pulmonary hyperinflation and interstitial prominence appear stable. No evidence of pulmonary infiltrate or edema. No evidence of pneumothorax or pleural effusion. IMPRESSION: COPD.  No active disease. Electronically Signed   By: Earle Gell M.D.   On: 05/07/2016 15:24    Will have my nurse inform pt that CXR shows expected changes of bronchiectasis.  No new findings.  Will call back after review of PFT.  No change to current tx.

## 2016-05-07 NOTE — Progress Notes (Signed)
Current Outpatient Prescriptions on File Prior to Visit  Medication Sig  . Acetaminophen (TYLENOL EXTRA STRENGTH PO) Take 350 mg by mouth 3 (three) times daily as needed.   . benzonatate (TESSALON) 200 MG capsule Take 1 capsule (200 mg total) by mouth 3 (three) times daily as needed for cough.  . Calcium-Vitamin D-Vitamin K (VIACTIV PO) Take by mouth.  . dexlansoprazole (DEXILANT) 60 MG capsule Take 1 capsule (60 mg total) by mouth daily.  . fexofenadine (ALLEGRA) 180 MG tablet Take 180 mg by mouth daily.  . montelukast (SINGULAIR) 10 MG tablet Take 1 tablet (10 mg total) by mouth at bedtime.  . Multiple Vitamins-Calcium (VIACTIV MULTI-VITAMIN) CHEW Chew 1 tablet by mouth 4 (four) times daily.    [name] PREMARIN 0.3 MG tablet Take 0.3 mg by mouth daily.    No current facility-administered medications on file prior to visit.    Chief Complaint  Patient presents with  . Follow-up    Pt c/o cough, SOB - unchanged since last OV. Notes some sore throat and some left ear pain.    Tests PFT 12/31/09 >> FEV1 1.84(84%), FEV1% 73, TLC 4.94(98%), DLCO 77%, +BD. CT chest 08/17/10 >> mild biapical scarring, basilar peribronchovascular nodularity and mild bronchiectasis. Echo 06/04/15 >> EF 55%, probable PFO  Past medical history GERD, Nutcracker esophagus, Iron deficiency anemia, Gastroparesis, Raynaud's, Neuropathy Rt leg, endometriosis  Past surgical history, Family history, Social history, Allergies reviewed.  Vital signs BP 102/70 mmHg  Pulse 66  Ht 5\' 4"  (1.626 m)  Wt 93 lb 3.2 oz (42.275 kg)  BMI 15.99 kg/m2  SpO2 100%   History of Present Illness: [name] is a 75 y.o. female former smoker with bronchiectasis, and recurrent aspiration.  She has prior history of cryptogenic organizing pneumonia.  She was found to have nutcracker esophagus.  She has sore throat and trouble swallowing.  She needed Abx in April.  Not as much cough or sputum now.  She keeps up with her exercise  regimen.  She had episode of dizziness/confusion >> has appt with neurology.   Physical Exam:  General - thin, no distress HEENT - no sinus tenderness, no LAN, no oral exudate Cardiac - s1s2 regular, no murmur Chest - scattered rhonchi, no wheeze Abdomen - soft, nontender Extremities - no edema Skin - no rashes Neurologic - normal strength Psychiatric - normal mood, behavior  Assessment/Plan:  Bronchiectasis related to prior episodes of PNA and BOOP. - repeat chest xray and PFT >> then decide if additional interventions needed - continue singulair and prn albuterol  Allergic rhinitis. - continue singulair, allegra   Patient Instructions  Chest xray today Will schedule pulmonary function test  Follow up in 6 months     Chesley Mires, MD [place] 05/07/2016, 3:03 PM Pager:  [phone]

## 2016-05-12 NOTE — Telephone Encounter (Signed)
Patient notified of results. Nothing further needed.  

## 2016-05-17 ENCOUNTER — Other Ambulatory Visit (HOSPITAL_COMMUNITY)
Admission: RE | Admit: 2016-05-17 | Discharge: 2016-05-17 | Disposition: A | Discharge status: Home / Self Care | Payer: Medicare Other | Source: Ambulatory Visit | Attending: Surgery | Admitting: Surgery

## 2016-05-17 ENCOUNTER — Other Ambulatory Visit: Payer: Self-pay | Admitting: Surgery

## 2016-05-17 DIAGNOSIS — N63 Unspecified lump in breast: Secondary | ICD-10-CM | POA: Insufficient documentation

## 2016-06-18 ENCOUNTER — Ambulatory Visit: Payer: Self-pay | Admitting: Neurology

## 2016-06-21 ENCOUNTER — Ambulatory Visit (INDEPENDENT_AMBULATORY_CARE_PROVIDER_SITE_OTHER): Payer: Medicare Other | Admitting: Pulmonary Disease

## 2016-06-21 DIAGNOSIS — J479 Bronchiectasis, uncomplicated: Secondary | ICD-10-CM | POA: Diagnosis not present

## 2016-06-21 LAB — PULMONARY FUNCTION TEST
DL/VA % pred: 79 %
DL/VA: 3.8 ml/min/mmHg/L
DLCO COR % PRED: 47 %
DLCO UNC % PRED: 43 %
DLCO UNC: 10.51 ml/min/mmHg
DLCO cor: 11.45 ml/min/mmHg
FEF 25-75 POST: 0.29 L/s
FEF 25-75 PRE: 1.28 L/s
FEF2575-%Change-Post: -77 %
FEF2575-%PRED-POST: 16 %
FEF2575-%Pred-Pre: 72 %
FEV1-%Change-Post: -24 %
FEV1-%PRED-POST: 42 %
FEV1-%Pred-Pre: 55 %
FEV1-PRE: 1.22 L
FEV1-Post: 0.92 L
FEV1FVC-%Change-Post: -9 %
FEV1FVC-%PRED-PRE: 101 %
FEV6-%Change-Post: -9 %
FEV6-%PRED-POST: 47 %
FEV6-%Pred-Pre: 52 %
FEV6-POST: 1.3 L
FEV6-Pre: 1.44 L
FEV6FVC-%PRED-POST: 105 %
FEV6FVC-%Pred-Pre: 105 %
FVC-%Change-Post: -16 %
FVC-%Pred-Post: 45 %
FVC-%Pred-Pre: 54 %
FVC-Post: 1.32 L
FVC-Pre: 1.59 L
PRE FEV1/FVC RATIO: 76 %
Post FEV1/FVC ratio: 69 %
Post FEV6/FVC ratio: 100 %
Pre FEV6/FVC Ratio: 100 %
RV % pred: 164 %
RV: 3.69 L
TLC % pred: 104 %
TLC: 5.29 L

## 2016-06-21 NOTE — Progress Notes (Signed)
PFT performed today. 

## 2016-06-22 ENCOUNTER — Encounter: Payer: Self-pay | Admitting: Neurology

## 2016-06-22 ENCOUNTER — Other Ambulatory Visit (INDEPENDENT_AMBULATORY_CARE_PROVIDER_SITE_OTHER): Payer: Medicare Other

## 2016-06-22 ENCOUNTER — Ambulatory Visit (INDEPENDENT_AMBULATORY_CARE_PROVIDER_SITE_OTHER): Payer: Medicare Other | Admitting: Neurology

## 2016-06-22 VITALS — BP 100/70 | HR 71 | Ht 64.0 in | Wt 98.0 lb

## 2016-06-22 DIAGNOSIS — G459 Transient cerebral ischemic attack, unspecified: Secondary | ICD-10-CM | POA: Diagnosis not present

## 2016-06-22 DIAGNOSIS — R519 Headache, unspecified: Secondary | ICD-10-CM

## 2016-06-22 DIAGNOSIS — R51 Headache: Secondary | ICD-10-CM

## 2016-06-22 LAB — CREATININE, SERUM: Creatinine, Ser: 0.61 mg/dL (ref 0.40–1.20)

## 2016-06-22 LAB — HIGH SENSITIVITY CRP: CRP HIGH SENSITIVITY: 0.2 mg/L (ref 0.000–5.000)

## 2016-06-22 LAB — BUN: BUN: 14 mg/dL (ref 6–23)

## 2016-06-22 LAB — SEDIMENTATION RATE: SED RATE: 11 mm/h (ref 0–30)

## 2016-06-22 NOTE — Patient Instructions (Addendum)
1. Bloodwork for BUN, creatinine, ESR, CRP 2. Schedule MRI brain without contrast 3. Schedule MRA head without contrast, MRA neck with and without contrast 4. Schedule 2-D echo with bubble study 5. Continue daily aspirin 6. Bloodwork done at Dr. Carlyle Lipa office will be requested for review 7. If symptoms change or progress, go to ER immediately

## 2016-06-22 NOTE — Progress Notes (Signed)
NEUROLOGY CONSULTATION NOTE  [name] MRN: [id number] DOB: 16-Mar-1941  Referring provider: Dr. [name] Primary care provider: Dr. [name]  Reason for consult:  confusion  Dear Dr Felipa Eth:  Thank you for your kind referral of [name] for consultation of the above symptoms. Although her history is well known to you, please allow me to reiterate it for the purpose of our medical record. Records and images were personally reviewed where available.  HISTORY OF PRESENT ILLNESS: This is a pleasant 75 year old right-handed woman with a history of GERD, nutcracker esophagus, pulmonary fibrosis, in her usual state of health until early June while walking into a grocery she suddenly had left temporal pain, then it felt like she was hit on both side of the head with a hammer. She reports she was "hit like gangbusters," she was very dizzy with a spinning sensation, nauseated. She held on to the cart for balance and had difficulty concentrating. Her vision was blurred. She managed to finish her chore and walked to the car, then noticed that she was having a hard time talking. She could see the sentence broken up in her head and could not get the words out. She was able to drive home with no focal symptoms noted. The episode lasted 30 minutes or so, she felt weak and tired after. Since then she has had at least 2 or 3 more episodes of headaches with dizziness, which did not progress to speech difficulties. The symptoms would last around 5 minutes, no clear positional component to the dizziness. When she feels it coming on, she massages her head, which seems to help. She was unsure if Tylenol helped. She denies any prior history of headaches. She denies any vision loss, photo/phonophobia, diplopia, dysarthria, jaw claudication, hearing loss. She has occasional pulsatile tinnitus more in the afternoons. She has GERD which makes swallowing difficult. She has a history of neuropathy affecting  both feet, R>L, with burning and numbness/tingling for several years. She has been doing laser therapy for venous insufficiency/varicose veins. She denies any falls but trips more. Sleep varies. She is very active and rides her bike for a couple of hours daily. She works at a preschool. Her mother had a stroke at age 37.   PAST MEDICAL HISTORY: Past Medical History:  Diagnosis Date  . Allergy   . Arthritis   . BOOP (bronchiolitis obliterans with organizing pneumonia) ([place])   . Bronchiectasis   . Esophageal dysmotility   . Gastroparesis   . GERD (gastroesophageal reflux disease)    severe  . Iron deficiency anemia   . Neuropathy ([place])   . Nutcracker esophagus   . Pneumonia   . Pulmonary fibrosis ([place])   . Raynaud's disease   . Right lower lobe pneumonia December 2013    PAST SURGICAL HISTORY: Past Surgical History:  Procedure Laterality Date  . [place] STUDY N/A 10/13/2015   Procedure: [place] STUDY;  Surgeon: Gatha Mayer, MD;  Location: [facility];  Service: Endoscopy;  Laterality: N/A;  . BREAST LUMPECTOMY     left, x 2  . CATARACT EXTRACTION     both eyes  . CHOLECYSTECTOMY  2005  . COLONOSCOPY    . ESOPHAGEAL MANOMETRY N/A 10/06/2015   Procedure: ESOPHAGEAL MANOMETRY (EM);  Surgeon: Manus Gunning, MD;  Location: [facility];  Service: Gastroenterology;  Laterality: N/A;  . LUNG BIOPSY     vats right 2003  . TOTAL ABDOMINAL HYSTERECTOMY    .  UPPER GASTROINTESTINAL ENDOSCOPY      MEDICATIONS: Current Outpatient Prescriptions on File Prior to Visit  Medication Sig Dispense Refill  . Acetaminophen (TYLENOL EXTRA STRENGTH PO) Take 350 mg by mouth 3 (three) times daily as needed.     . benzonatate (TESSALON) 200 MG capsule Take 1 capsule (200 mg total) by mouth 3 (three) times daily as needed for cough. 60 capsule 1  . Calcium-Vitamin D-Vitamin K (VIACTIV PO) Take by mouth.    . dexlansoprazole (DEXILANT) 60 MG capsule Take 1 capsule (60 mg total) by  mouth daily. 90 capsule 3  . fexofenadine (ALLEGRA) 180 MG tablet Take 180 mg by mouth daily.    . montelukast (SINGULAIR) 10 MG tablet Take 1 tablet (10 mg total) by mouth at bedtime. 90 tablet 1  . Multiple Vitamins-Calcium (VIACTIV MULTI-VITAMIN) CHEW Chew 1 tablet by mouth 4 (four) times daily.      [name] PREMARIN 0.3 MG tablet Take 0.3 mg by mouth daily.      No current facility-administered medications on file prior to visit.     ALLERGIES: Allergies  Allergen Reactions  . Cortisone     unsure  . Prednisone   . Sulfonamide Derivatives     Unsure of reaction    FAMILY HISTORY: Family History  Problem Relation Age of Onset  . Lymphoma Mother   . Lung cancer Paternal Uncle   . Heart disease Father   . Heart disease Paternal Grandfather   . Colon cancer Neg Hx   . Esophageal cancer Neg Hx   . Rectal cancer Neg Hx   . Stomach cancer Neg Hx   . Leukemia Paternal Grandmother   . Cancer Maternal Grandmother     unknown type    SOCIAL HISTORY: Social History   Social History  . Marital status: Widowed    Spouse name: N/A  . Number of children: 2  . Years of education: N/A   Occupational History  . retired Pharmacist, hospital   .  Retired   Social History Main Topics  . Smoking status: Former Smoker    Types: Cigarettes    Quit date: 11/15/1968  . Smokeless tobacco: Never Used     Comment: 3 cigs a day  . Alcohol use No  . Drug use: No  . Sexual activity: Not on file   Other Topics Concern  . Not on file   Social History Narrative  . No narrative on file    REVIEW OF SYSTEMS: Constitutional: No fevers, chills, or sweats, no generalized fatigue, change in appetite Eyes: No visual changes, double vision, eye pain Ear, nose and throat: No hearing loss, ear pain, nasal congestion, sore throat Cardiovascular: No chest pain, palpitations Respiratory:  No shortness of breath at rest or with exertion, wheezes GastrointestinaI: No nausea, vomiting, diarrhea, abdominal pain,  fecal incontinence Genitourinary:  No dysuria, urinary retention or frequency Musculoskeletal:  + neck pain, back pain Integumentary: No rash, pruritus, skin lesions Neurological: as above Psychiatric: No depression, insomnia, anxiety Endocrine: No palpitations, fatigue, diaphoresis, mood swings, change in appetite, change in weight, increased thirst Hematologic/Lymphatic:  No anemia, purpura, petechiae. Allergic/Immunologic: no itchy/runny eyes, nasal congestion, recent allergic reactions, rashes  PHYSICAL EXAM: Vitals:   06/22/16 0913  BP: 100/70  Pulse: 71   General: No acute distress Head:  Normocephalic/atraumatic, +tenderness over the bilateral temporal and occipital regions Eyes: Fundoscopic exam shows bilateral sharp discs, no vessel changes, exudates, or hemorrhages Neck: supple, + paraspinal tenderness, full range of motion Back:  No paraspinal tenderness Heart: regular rate and rhythm Lungs: Clear to auscultation bilaterally. Vascular: No carotid bruits. Skin/Extremities: No rash, no edema Neurological Exam: Mental status: alert and oriented to person, place, and time, no dysarthria or aphasia, Fund of knowledge is appropriate.  Recent and remote memory are intact.  Attention and concentration are normal.    Able to name objects and repeat phrases. Cranial nerves: CN I: not tested CN II: pupils equal, round and reactive to light, visual fields intact, fundi unremarkable. CN III, IV, VI:  full range of motion, no nystagmus, no ptosis CN V: facial sensation intact CN VII: upper and lower face symmetric CN VIII: hearing intact to finger rub CN IX, X: gag intact, uvula midline CN XI: sternocleidomastoid and trapezius muscles intact CN XII: tongue midline Bulk & Tone: normal, no fasciculations. Motor: 5/5 throughout with no pronator drift. Sensation: decreased cold on left UE and LE, decreased pin on right LE, decreased vibration to knees bilaterally. Romberg test  negative Deep Tendon Reflexes: +2 throughout, no ankle clonus Plantar responses: downgoing bilaterally Cerebellar: no incoordination on finger to nose, heel to shin. No dysdiadochokinesia Gait: narrow-based and steady, able to tandem walk adequately. Tremor: none  IMPRESSION: This is a pleasant 75 year old right-handed woman with a history of GERD, nutcracker esophagus, pulmonary fibrosis, presenting with a transient episode of headache, vertigo, and expressive aphasia last June. She has not had any further speech difficulties, but has had at least 3 more episodes of headache with dizziness. Her neurological exam is overall non-focal except for some patchy sensory changes in both extremities and evidence of a length-dependent neuropathy. Symptoms concerning for TIA, however the episodic headaches with dizziness since then are of unclear etiology. MRI brain and MRA head and neck will be ordered as part of TIA workup. Her previous echo had noted a probable PFO. Echo with bubble study will be ordered. She is now on a daily aspirin. We discussed control of vascular risk factors, goal LDL <70. Bloodwork from her PCP will be requested for review. In this age group with new onset headaches, temporal arteritis is also considered, check ESR and CRP. She knows to go to the ER immediately if symptoms change or progress. She will follow-up in a month and knows to call for any changes.   Thank you for allowing me to participate in the care of this patient. Please do not hesitate to call for any questions or concerns.   Ellouise Newer, M.D.  CC: Dr. Felipa Eth

## 2016-06-23 ENCOUNTER — Telehealth: Payer: Self-pay

## 2016-06-23 NOTE — Telephone Encounter (Signed)
-----   Message from Cameron Sprang, MD sent at 06/22/2016  2:16 PM EDT ----- Pls let her know bloodwork looks good, no evidence of inflammation. Thanks!

## 2016-06-23 NOTE — Telephone Encounter (Signed)
Spoke with patient and relayed that per Dr. Delice Lesch her blood work was good and showed no evidence of inflammation.  Patient acknowledged and understood.

## 2016-06-30 ENCOUNTER — Other Ambulatory Visit: Payer: Self-pay | Admitting: *Deleted

## 2016-06-30 ENCOUNTER — Telehealth: Payer: Self-pay | Admitting: *Deleted

## 2016-06-30 DIAGNOSIS — G459 Transient cerebral ischemic attack, unspecified: Secondary | ICD-10-CM

## 2016-06-30 NOTE — Telephone Encounter (Signed)
Since the most recent labs from PCP were drawn 06/16/15, Dr Delice Lesch has requested a Lipid Panel be drawn, pt will need to fast for this lab.  Order is placed.

## 2016-07-05 ENCOUNTER — Other Ambulatory Visit (INDEPENDENT_AMBULATORY_CARE_PROVIDER_SITE_OTHER): Payer: Medicare Other

## 2016-07-05 DIAGNOSIS — G459 Transient cerebral ischemic attack, unspecified: Secondary | ICD-10-CM

## 2016-07-05 LAB — LIPID PANEL
Cholesterol: 214 mg/dL — ABNORMAL HIGH (ref 0–200)
HDL: 85.4 mg/dL (ref >39.00)
LDL Cholesterol: 106 mg/dL — ABNORMAL HIGH (ref 0–99)
NONHDL: 128.59
Total CHOL/HDL Ratio: 3
Triglycerides: 113 mg/dL (ref 0.0–149.0)
VLDL: 22.6 mg/dL (ref 0.0–40.0)

## 2016-07-06 ENCOUNTER — Telehealth: Payer: Self-pay | Admitting: Pulmonary Disease

## 2016-07-06 NOTE — Telephone Encounter (Signed)
Pt calling to get the results of PFT done on 8/8.  VS please advise of these results.  thanks

## 2016-07-07 ENCOUNTER — Telehealth: Payer: Self-pay | Admitting: Neurology

## 2016-07-07 MED ORDER — BENZONATATE 200 MG PO CAPS: 200.0000 mg | ORAL_CAPSULE | Freq: Three times a day (TID) | ORAL | 1 refills | Status: DC | PRN

## 2016-07-07 NOTE — Telephone Encounter (Signed)
PFT 06/21/16 >> FEV1 1.22 (55%), FEV1% 76, TLC 5.29 (104%), RV 3.69 (164%), DLCO 43%   Results discussed with pt.  She feels her symptoms are stable, and doesn't feel like she needs additional inhaler regime at present.  Will refill her tessalon.

## 2016-07-07 NOTE — Telephone Encounter (Signed)
[name] 10/28/2041. I had sent you a staff message earlier regarding this patient and precert for her Echo tomorrow. She called in this morning needing to cancel that for tomorrow at 3:00 due to a family emergency. I believe the # for that is [phone]. The patient said she could do next week sometime. She is coming in on 9/15 to see Dr. Delice Lesch and would like it before then. Thank you

## 2016-07-07 NOTE — Telephone Encounter (Signed)
Notified pt that she will need to contact [facility] to cancel and reschedule appt.

## 2016-07-08 ENCOUNTER — Other Ambulatory Visit (HOSPITAL_COMMUNITY): Payer: Self-pay

## 2016-07-12 ENCOUNTER — Telehealth: Payer: Self-pay

## 2016-07-12 ENCOUNTER — Ambulatory Visit
Admission: RE | Admit: 2016-07-12 | Discharge: 2016-07-12 | Disposition: A | Discharge status: Home / Self Care | Payer: Medicare Other | Source: Ambulatory Visit | Attending: Neurology | Admitting: Neurology

## 2016-07-12 DIAGNOSIS — R519 Headache, unspecified: Secondary | ICD-10-CM

## 2016-07-12 DIAGNOSIS — R51 Headache: Secondary | ICD-10-CM

## 2016-07-12 DIAGNOSIS — G459 Transient cerebral ischemic attack, unspecified: Secondary | ICD-10-CM

## 2016-07-12 MED ORDER — GADOBENATE DIMEGLUMINE 529 MG/ML IV SOLN
8.0000 mL | Freq: Once | INTRAVENOUS | Status: AC | PRN
  Administered 2016-07-12: 8 mL via INTRAVENOUS

## 2016-07-12 NOTE — Telephone Encounter (Signed)
Pre-certification for MRI brain wo MRA head wo and neck w/wo completed using [facility]. Pre-cert approval number [id number]. Notified [facility].

## 2016-07-16 ENCOUNTER — Other Ambulatory Visit: Payer: Self-pay

## 2016-07-16 ENCOUNTER — Ambulatory Visit (HOSPITAL_COMMUNITY): Payer: Medicare Other | Attending: Internal Medicine

## 2016-07-16 ENCOUNTER — Encounter (HOSPITAL_COMMUNITY): Payer: Self-pay | Admitting: Cardiology

## 2016-07-16 ENCOUNTER — Other Ambulatory Visit: Payer: Self-pay | Admitting: Neurology

## 2016-07-16 DIAGNOSIS — Q211 Atrial septal defect: Secondary | ICD-10-CM | POA: Diagnosis not present

## 2016-07-16 DIAGNOSIS — I071 Rheumatic tricuspid insufficiency: Secondary | ICD-10-CM | POA: Insufficient documentation

## 2016-07-16 DIAGNOSIS — I340 Nonrheumatic mitral (valve) insufficiency: Secondary | ICD-10-CM | POA: Insufficient documentation

## 2016-07-16 DIAGNOSIS — R51 Headache: Secondary | ICD-10-CM

## 2016-07-16 DIAGNOSIS — I639 Cerebral infarction, unspecified: Secondary | ICD-10-CM | POA: Diagnosis present

## 2016-07-16 DIAGNOSIS — I358 Other nonrheumatic aortic valve disorders: Secondary | ICD-10-CM | POA: Diagnosis not present

## 2016-07-16 DIAGNOSIS — Z87891 Personal history of nicotine dependence: Secondary | ICD-10-CM | POA: Insufficient documentation

## 2016-07-16 DIAGNOSIS — G459 Transient cerebral ischemic attack, unspecified: Secondary | ICD-10-CM

## 2016-07-16 DIAGNOSIS — R519 Headache, unspecified: Secondary | ICD-10-CM

## 2016-07-16 NOTE — Progress Notes (Unsigned)
Echo ordered with bubble study. Unable to gain access x3 attempts by [facility] (triage), [facility] (echo), and [facility](CT).

## 2016-07-30 ENCOUNTER — Ambulatory Visit (INDEPENDENT_AMBULATORY_CARE_PROVIDER_SITE_OTHER): Payer: Medicare Other | Admitting: Neurology

## 2016-07-30 ENCOUNTER — Encounter: Payer: Self-pay | Admitting: Neurology

## 2016-07-30 VITALS — BP 120/62 | HR 70 | Ht 64.0 in | Wt 94.0 lb

## 2016-07-30 DIAGNOSIS — G629 Polyneuropathy, unspecified: Secondary | ICD-10-CM

## 2016-07-30 DIAGNOSIS — G459 Transient cerebral ischemic attack, unspecified: Secondary | ICD-10-CM

## 2016-07-30 DIAGNOSIS — R51 Headache: Secondary | ICD-10-CM | POA: Diagnosis not present

## 2016-07-30 DIAGNOSIS — R519 Headache, unspecified: Secondary | ICD-10-CM

## 2016-07-30 NOTE — Patient Instructions (Addendum)
1. Bloodwork for B12, TSH, folate, B1, SPEP/IFE, RPR, ANA 2. Discuss cholesterol with Dr. Felipa Eth 3. Continue with treatments at [facility], neck therapy 4. If previous symptoms recur, go to ER immediately 5. Follow-up in 3 months, call for any changes

## 2016-07-30 NOTE — Progress Notes (Signed)
NEUROLOGY FOLLOW UP OFFICE NOTE  [name] [id number]  HISTORY OF PRESENT ILLNESS: I had the pleasure of seeing [name] in follow-up in the neurology clinic on 07/30/2016.  The patient was last seen 6 weeks ago after an episode of headache with dizziness and speech difficulty. She had a few more episodes of headaches with dizziness after that. Records and images were personally reviewed where available.  I personally reviewed MRI brain without contrast which did not show any acute changes, there was mild age-related volume loss. MRA neck did not show any significant stenosis. MRA head showed a short segment of moderate stenosis of the left P1 and diminished distal flow-related signal within the left PCA distribution. Her lipid panel showed a total cholesterol of 214, LDL 106. Her ESR and CRP were normal. She denies any further similar symptoms, except for a mild headache with dizziness since her last visit. Her main concern today is the neuropathy in both feet. She has numbness on the dorsum of both feet, with burning, stinging pain, worse on the right foot. Both legs feel swollen. She denies any falls or weakness. She has been going to a [facility]ness center where laser is done on her legs, which has made the pain more bearable. She is also doing yoga.    HPI: This is a pleasant 75 yo RH woman with a history of GERD, nutcracker esophagus, pulmonary fibrosis, in her usual state of health until early June while walking into a grocery she suddenly had left temporal pain, then it felt like she was hit on both side of the head with a hammer. She reports she was "hit like gangbusters," she was very dizzy with a spinning sensation, nauseated. She held on to the cart for balance and had difficulty concentrating. Her vision was blurred. She managed to finish her chore and walked to the car, then noticed that she was having a hard time talking. She could see the sentence broken up in her head and could not get  the words out. She was able to drive home with no focal symptoms noted. The episode lasted 30 minutes or so, she felt weak and tired after. Since then she has had at least 2 or 3 more episodes of headaches with dizziness, which did not progress to speech difficulties. The symptoms would last around 5 minutes, no clear positional component to the dizziness. When she feels it coming on, she massages her head, which seems to help. She was unsure if Tylenol helped. She denies any prior history of headaches. She denies any vision loss, photo/phonophobia, diplopia, dysarthria, jaw claudication, hearing loss. She has occasional pulsatile tinnitus more in the afternoons. She has GERD which makes swallowing difficult. She has a history of neuropathy affecting both feet, R>L, with burning and numbness/tingling for several years. She has been doing laser therapy for venous insufficiency/varicose veins. She denies any falls but trips more. Sleep varies. She is very active and rides her bike for a couple of hours daily. She works at a preschool. Her mother had a stroke at age 7.   PAST MEDICAL HISTORY: Past Medical History:  Diagnosis Date  . Allergy   . Arthritis   . BOOP (bronchiolitis obliterans with organizing pneumonia) ([place])   . Bronchiectasis   . Esophageal dysmotility   . Gastroparesis   . GERD (gastroesophageal reflux disease)    severe  . Iron deficiency anemia   . Neuropathy ([place])   . Nutcracker esophagus   . Pneumonia   .  Pulmonary fibrosis (H[facility])   . Raynaud's disease   . Right lower lobe pneumonia December 2013    MEDICATIONS: Current Outpatient Prescriptions on File Prior to Visit  Medication Sig Dispense Refill  . Acetaminophen (TYLENOL EXTRA STRENGTH PO) Take 350 mg by mouth 3 (three) times daily as needed.     . benzonatate (TESSALON) 200 MG capsule Take 1 capsule (200 mg total) by mouth 3 (three) times daily as needed for cough. 60 capsule 1  . Calcium-Vitamin D-Vitamin K (VIACTIV  PO) Take by mouth.    . dexlansoprazole (DEXILANT) 60 MG capsule Take 1 capsule (60 mg total) by mouth daily. 90 capsule 3  . fexofenadine (ALLEGRA) 180 MG tablet Take 180 mg by mouth daily.    . montelukast (SINGULAIR) 10 MG tablet Take 1 tablet (10 mg total) by mouth at bedtime. 90 tablet 1  . Multiple Vitamins-Calcium (VIACTIV MULTI-VITAMIN) CHEW Chew 1 tablet by mouth 4 (four) times daily.      . PREMARIN 0.3 MG tablet Take 0.3 mg by mouth daily.     Aspirin 81 mg daily No current facility-administered medications on file prior to visit.     ALLERGIES: Allergies  Allergen Reactions  . Cortisone     unsure  . Prednisone   . Sulfonamide Derivatives     Unsure of reaction    FAMILY HISTORY: Family History  Problem Relation Age of Onset  . Lymphoma Mother   . Heart disease Father   . Lung cancer Paternal Uncle   . Heart disease Paternal Grandfather   . Leukemia Paternal Grandmother   . Cancer Maternal Grandmother     unknown type  . Colon cancer Neg Hx   . Esophageal cancer Neg Hx   . Rectal cancer Neg Hx   . Stomach cancer Neg Hx     SOCIAL HISTORY: Social History   Social History  . Marital status: Widowed    Spouse name: N/A  . Number of children: 2  . Years of education: N/A   Occupational History  . retired teacher   .  Retired   Social History Main Topics  . Smoking status: Former Smoker    Types: Cigarettes    Quit date: 11/15/1968  . Smokeless tobacco: Never Used     Comment: 3 cigs a day  . Alcohol use No  . Drug use: No  . Sexual activity: Not on file   Other Topics Concern  . Not on file   Social History Narrative  . No narrative on file    REVIEW OF SYSTEMS: Constitutional: No fevers, chills, or sweats, no generalized fatigue, change in appetite Eyes: No visual changes, double vision, eye pain Ear, nose and throat: No hearing loss, ear pain, nasal congestion, sore throat Cardiovascular: No chest pain, palpitations Respiratory:  No  shortness of breath at rest or with exertion, wheezes GastrointestinaI: No nausea, vomiting, diarrhea, abdominal pain, fecal incontinence Genitourinary:  No dysuria, urinary retention or frequency Musculoskeletal:  + neck pain, back pain Integumentary: No rash, pruritus, skin lesions Neurological: as above Psychiatric: No depression, insomnia, anxiety Endocrine: No palpitations, fatigue, diaphoresis, mood swings, change in appetite, change in weight, increased thirst Hematologic/Lymphatic:  No anemia, purpura, petechiae. Allergic/Immunologic: no itchy/runny eyes, nasal congestion, recent allergic reactions, rashes  PHYSICAL EXAM: Vitals:   07/30/16 1311  BP: 120/62  Pulse: 70   General: No acute distress Head:  Normocephalic/atraumatic Neck: supple, no paraspinal tenderness, full range of motion Heart:  Regular rate and rhythm Lungs:    Clear to auscultation bilaterally Back: No paraspinal tenderness Skin/Extremities: No rash, no edema Neurological Exam: alert and oriented to person, place, and time. No aphasia or dysarthria. Fund of knowledge is appropriate.  Recent and remote memory are intact.  Attention and concentration are normal.    Able to name objects and repeat phrases. Cranial nerves: Pupils equal, round, reactive to light. Extraocular movements intact with no nystagmus. Visual fields full. Facial sensation intact. No facial asymmetry. Tongue, uvula, palate midline.  Motor: Bulk and tone normal, muscle strength 5/5 throughout with no pronator drift.  Sensation intact to all modalities on both UE, decreased vibration and pin to knees bilaterally.  Deep tendon reflexes +1 throughout, toes downgoing.  Finger to nose testing intact.  Gait narrow-based and steady, able to tandem walk adequately.  Romberg negative.  IMPRESSION: This is a pleasant 74 yo RH woman with a history of GERD, nutcracker esophagus, pulmonary fibrosis, who had a transient episode of headache, vertigo, and  expressive aphasia last June 2017, concerning for TIA. MRI brain no acute changes. Her MRA head and neck was unremarkable except for moderate stenosis of the left P1 and diminished distal flow-related signal within the left PCA distribution. Goal LDL of <70, we discussed starting a statin but she wants to discuss this with Dr. Stoneking first. Continue daily aspirin and control of vascular risk factors. She has had only a mild headache episode recently, possibly cervicogenic in nature. We discussed doing PT for neck pain, she wants to do this with her Wellness center for now. She also expressed concern about neuropathy, exam shows length-dependent neuropathy. Neuropathy labs will be ordered. We discussed symptomatic treatment with gabapentin, she would like to hold off for now. She knows to go to the ER immediately if symptoms change or progress. She will follow-up in 3 months and knows to call for any changes.   Thank you for allowing me to participate in her care.  Please do not hesitate to call for any questions or concerns.  The duration of this appointment visit was 25 minutes of face-to-face time with the patient.  Greater than 50% of this time was spent in counseling, explanation of diagnosis, planning of further management, and coordination of care.   Karen Aquino, M.D.   CC: Dr. Stoneking      

## 2016-08-03 ENCOUNTER — Other Ambulatory Visit (INDEPENDENT_AMBULATORY_CARE_PROVIDER_SITE_OTHER): Payer: Medicare Other

## 2016-08-03 DIAGNOSIS — G629 Polyneuropathy, unspecified: Secondary | ICD-10-CM | POA: Diagnosis not present

## 2016-08-03 LAB — VITAMIN B12: VITAMIN B 12: 358 pg/mL (ref 211–911)

## 2016-08-03 LAB — TSH: TSH: 3.68 u[IU]/mL (ref 0.35–4.50)

## 2016-08-03 LAB — FOLATE: Folate: 4.3 ng/mL — ABNORMAL LOW (ref >5.9)

## 2016-08-04 LAB — ANA, IFA COMPREHENSIVE PANEL
ANA: NEGATIVE
ENA SM Ab Ser-aCnc: <1
SM/RNP: <1
SSA (RO) (ENA) ANTIBODY, IGG: NEGATIVE
SSB (La) (ENA) Antibody, IgG: <1
Scleroderma (Scl-70) (ENA) Antibody, IgG: <1
ds DNA Ab: 1 IU/mL

## 2016-08-04 LAB — RPR

## 2016-08-05 LAB — IMMUNOFIXATION ELECTROPHORESIS
IGG (IMMUNOGLOBIN G), SERUM: 962 mg/dL (ref 694–1618)
IGM, SERUM: 63 mg/dL (ref 48–271)
IgA: 303 mg/dL (ref 81–463)

## 2016-08-06 LAB — PROTEIN ELECTROPHORESIS, SERUM
ABNORMAL PROTEIN BAND2: NOT DETECTED g/dL
ABNORMAL PROTEIN BAND3: NOT DETECTED g/dL
ALBUMIN ELP: 3.9 g/dL (ref 3.8–4.8)
Alpha-1-Globulin: 0.5 g/dL — ABNORMAL HIGH (ref 0.2–0.3)
Alpha-2-Globulin: 0.7 g/dL (ref 0.5–0.9)
Beta 2: 0.3 g/dL (ref 0.2–0.5)
Beta Globulin: 0.5 g/dL (ref 0.4–0.6)
Gamma Globulin: 0.9 g/dL (ref 0.8–1.7)
TOTAL PROTEIN, SERUM ELECTROPHOR: 6.8 g/dL (ref 6.1–8.1)

## 2016-08-06 LAB — VITAMIN B1

## 2016-08-10 ENCOUNTER — Other Ambulatory Visit: Payer: Self-pay

## 2016-08-10 DIAGNOSIS — E611 Iron deficiency: Secondary | ICD-10-CM

## 2016-08-12 ENCOUNTER — Telehealth: Payer: Self-pay | Admitting: Neurology

## 2016-08-12 MED ORDER — FOLIC ACID 1 MG PO TABS: 1.0000 mg | ORAL_TABLET | Freq: Every day | ORAL | 3 refills | Status: DC

## 2016-08-12 NOTE — Telephone Encounter (Signed)
Per recent lab results, "Recommend she start Thiamine 100mg  daily and folic acid 1mg  daily." RX sent to [facility].

## 2016-08-12 NOTE — Telephone Encounter (Signed)
[name] 07-22-2041. Her # is [phone] cell and home is [phone].  She was calling about the Folic Acid 1 mg  that Doctor Delice Lesch had prescribed for her. She said it needed to be a prescription and sent to [facility]. [facility] had Folic Acid [id number] mg. She said she would take that back if she could call in the 1mg . Thank you

## 2016-08-17 ENCOUNTER — Other Ambulatory Visit: Payer: Self-pay | Admitting: Neurology

## 2016-08-17 ENCOUNTER — Telehealth: Payer: Self-pay | Admitting: Neurology

## 2016-08-17 ENCOUNTER — Other Ambulatory Visit: Payer: Medicare Other

## 2016-08-17 DIAGNOSIS — E611 Iron deficiency: Secondary | ICD-10-CM

## 2016-08-17 NOTE — Telephone Encounter (Signed)
[name] [birth year]. She came into the office today to have blood work taken downstairs. She would like you to please call her regarding what the blood work is being tested for? Her home # is [phone] and cell [phone]. Thank you

## 2016-08-18 NOTE — Telephone Encounter (Signed)
Notified pt of labs drawn on 10/3.

## 2016-08-19 LAB — METHYLMALONIC ACID, SERUM: Methylmalonic Acid: 154 nmol/L (ref 0–378)

## 2016-08-19 LAB — HOMOCYSTEINE: HOMOCYSTEINE: 17.1 umol/L — AB (ref 0.0–15.0)

## 2016-08-20 LAB — HOMOCYSTEINE: Homocysteine: 18 umol/L — ABNORMAL HIGH (ref 0.0–15.0)

## 2016-08-20 LAB — METHYLMALONIC ACID, SERUM: Methylmalonic Acid: 147 nmol/L (ref 0–378)

## 2016-08-24 ENCOUNTER — Telehealth: Payer: Self-pay

## 2016-08-24 NOTE — Telephone Encounter (Signed)
Received new Rx request from [facility] for Omeprazole 40mg . In last OV note it states that you did not want her on this med??

## 2016-08-24 NOTE — Telephone Encounter (Signed)
It is okay to refill if she needs it, however I had thought she was on Dexilant? Can you check to see which she is one to ensure not both. If omeprazole is working for her she can continue it otherwise. Thanks

## 2016-08-26 NOTE — Telephone Encounter (Signed)
So this pt wants continuous refills of Omeprazole. She states that she does not take Dexilant and that she only takes nexium and Omeprazole. She wants Omeprazole sent to [facility]. How many refills do you want me to send in? Thank you.

## 2016-08-26 NOTE — Telephone Encounter (Signed)
[name] please let her know I am happy to refill Omeprazole. If she is not taking Dexilant that's fine, but you mentioned she is taking both omeprazole AND nexium, is that correct? She should be on EITHER nexium or omeprazole, but not both. Can you please confirm and then refill omeprazole for her if that is what her preference is. Thanks

## 2016-08-30 NOTE — Telephone Encounter (Signed)
Pt said she is returning your call from Friday

## 2016-08-31 NOTE — Telephone Encounter (Signed)
Pt calling Caryl Pina back about medication

## 2016-09-02 ENCOUNTER — Telehealth: Payer: Self-pay | Admitting: Gastroenterology

## 2016-09-02 ENCOUNTER — Other Ambulatory Visit: Payer: Self-pay

## 2016-09-02 MED ORDER — OMEPRAZOLE 40 MG PO CPDR: 40.0000 mg | DELAYED_RELEASE_CAPSULE | Freq: Two times a day (BID) | ORAL | 3 refills | Status: DC

## 2016-09-02 NOTE — Telephone Encounter (Signed)
Left message for pt. Since we are missing each others calls. I instructed on the message that Dr Havery Moros is fine to refill the Omeprazole however pt should not be taking Nexium as well. It is fine with Dr Havery Moros that pt is not taking the [place].

## 2016-09-03 ENCOUNTER — Other Ambulatory Visit: Payer: Self-pay

## 2016-09-03 MED ORDER — ESOMEPRAZOLE MAGNESIUM 20 MG PO CPDR: 20.0000 mg | DELAYED_RELEASE_CAPSULE | Freq: Every day | ORAL | 11 refills | Status: DC

## 2016-09-03 NOTE — Telephone Encounter (Signed)
Sent Omeprazole in to mail order pharmacy yesterday. Tried calling pt and there was no answer. She returned call this am and was upset that I did not speak with her yesterday. I informed that I sent in Omeprazole and pt was very frustrated that it was not Nexium. Per last OV note it was Omeprazole that the pt was taking. I sent in Nexium 20mg  #30 with 11 refills to the mail order pharmacy. Pt informed and instructed not to take both Omeprazole and Nexium. She understood but still angry that this had not been completed sooner.

## 2016-09-13 ENCOUNTER — Telehealth: Payer: Self-pay | Admitting: Gastroenterology

## 2016-09-13 NOTE — Telephone Encounter (Signed)
Patient very insistent that she be seen by Dr. Havery Moros to discuss her difficulty swallowing, medications. She did get her Nexium and it is not helping. She did not want to see an APP or wait until December.

## 2016-09-14 ENCOUNTER — Ambulatory Visit (INDEPENDENT_AMBULATORY_CARE_PROVIDER_SITE_OTHER): Payer: Medicare Other | Admitting: Gastroenterology

## 2016-09-14 ENCOUNTER — Ambulatory Visit: Payer: Self-pay | Admitting: Gastroenterology

## 2016-09-14 ENCOUNTER — Encounter: Payer: Self-pay | Admitting: Gastroenterology

## 2016-09-14 VITALS — BP 106/70 | HR 68 | Ht 64.0 in | Wt 96.4 lb

## 2016-09-14 DIAGNOSIS — K224 Dyskinesia of esophagus: Secondary | ICD-10-CM

## 2016-09-14 DIAGNOSIS — D509 Iron deficiency anemia, unspecified: Secondary | ICD-10-CM

## 2016-09-14 DIAGNOSIS — K219 Gastro-esophageal reflux disease without esophagitis: Secondary | ICD-10-CM

## 2016-09-14 MED ORDER — DILTIAZEM HCL ER 60 MG PO CP12: 60.0000 mg | ORAL_CAPSULE | Freq: Four times a day (QID) | ORAL | 1 refills | Status: DC | PRN

## 2016-09-14 MED ORDER — DEXLANSOPRAZOLE 60 MG PO CPDR: 60.0000 mg | DELAYED_RELEASE_CAPSULE | Freq: Every day | ORAL | 3 refills | Status: DC

## 2016-09-14 NOTE — Patient Instructions (Signed)
If you are age 73 or older, your body mass index should be between 23-30. Your Body mass index is 16.55 kg/m. If this is out of the aforementioned range listed, please consider follow up with your Primary Care Provider.  If you are age 48 or younger, your body mass index should be between 19-25. Your Body mass index is 16.55 kg/m. If this is out of the aformentioned range listed, please consider follow up with your Primary Care Provider.   We have sent the following medications to your pharmacy for you to pick up at your convenience:  Dexilant 60mg . Take one daily by mouth  Diltiazem 60mg . Take one tablet by mouth every 6 hours as needed.

## 2016-09-14 NOTE — Progress Notes (Signed)
HPI :  75 y/o female well known to me here for a follow up visit for the following issues:  GERD / Dysphagia - significant workup in the past with EGDs, esophageal manometry and 24 HR pH impedance testing. She had a Demeester score of 152.7 but only symptom index of 23% for reflux. Most of reflux was weakly acidic. She has had a negative gastric emptying study. Esophageal manometry showed what was thought to be nutcracker esophagus. She had been switched to Dexilant following pH study and reported benefit on this regimen. This ran out and she had worsening reflux symptoms. Now on nexium 40mg  BID. She continues to have heartburn which bothers her. She has heartburn most of the days of the week on nexium 40mg  BID. She continues to have dysphagia to both solids and liquids which has bothered her significantly. She has some discomfort in her chest with swallows. She had declined trial of althoids and diltiazem to date.   Iron deficiency anemia - underwent EGD and colonscopy for iron deficiency anemia on 08/2015. She also had a positive cologuard. Her colonoscopy was remarkable for 5 adenomas, all small, but this warrants a repeat surveillance colonoscopy to be done in 20/2019. No pathology to cause anemia noted on colonoscopy. Her EGD was remarkable for gastritis in the fundus without focal ulceration. Biopsies show reactive gastritis, no evidence of H pylori. Normal duodenum with normal biopsies. The SCJ was irregular but biopsies show no evidence of Barrett's. She had a capsule endoscopy 10/07/2015 which other than a diminutive red spot was normal. No pathology noted to have caused her anemia. Her Hgb had normalized on oral iron. She continues to deny any blood in the stools.       Past Medical History:  Diagnosis Date  . Allergy   . Arthritis   . BOOP (bronchiolitis obliterans with organizing pneumonia) ([place])   . Bronchiectasis   . Esophageal dysmotility   . Gastroparesis   . GERD  (gastroesophageal reflux disease)    severe  . Iron deficiency anemia   . Neuropathy ([place])   . Nutcracker esophagus   . Pneumonia   . Pulmonary fibrosis ([place])   . Raynaud's disease   . Right lower lobe pneumonia December 2013     Past Surgical History:  Procedure Laterality Date  . [place] STUDY N/A 10/13/2015   Procedure: [place] STUDY;  Surgeon: Gatha Mayer, MD;  Location: [facility] ENDOSCOPY;  Service: Endoscopy;  Laterality: N/A;  . BREAST LUMPECTOMY     left, x 2  . CATARACT EXTRACTION     both eyes  . CHOLECYSTECTOMY  2005  . COLONOSCOPY    . ESOPHAGEAL MANOMETRY N/A 10/06/2015   Procedure: ESOPHAGEAL MANOMETRY (EM);  Surgeon: Manus Gunning, MD;  Location: [facility] ENDOSCOPY;  Service: Gastroenterology;  Laterality: N/A;  . LUNG BIOPSY     vats right 2003  . TOTAL ABDOMINAL HYSTERECTOMY    . UPPER GASTROINTESTINAL ENDOSCOPY     Family History  Problem Relation Age of Onset  . Lymphoma Mother   . Heart disease Father   . Lung cancer Paternal Uncle   . Heart disease Paternal Grandfather   . Leukemia Paternal Grandmother   . Cancer Maternal Grandmother     unknown type  . Colon cancer Neg Hx   . Esophageal cancer Neg Hx   . Rectal cancer Neg Hx   . Stomach cancer Neg Hx    Social History  Substance Use Topics  .  Smoking status: Former Smoker    Types: Cigarettes    Quit date: 11/15/1968  . Smokeless tobacco: Never Used     Comment: 3 cigs a day  . Alcohol use No   Current Outpatient Prescriptions  Medication Sig Dispense Refill  . Acetaminophen (TYLENOL EXTRA STRENGTH PO) Take 350 mg by mouth 3 (three) times daily as needed.     [name] aspirin EC 81 MG tablet Take 81 mg by mouth daily.    . benzonatate (TESSALON) 200 MG capsule Take 1 capsule (200 mg total) by mouth 3 (three) times daily as needed for cough. 60 capsule 1  . Calcium-Vitamin D-Vitamin K (VIACTIV PO) Take by mouth.    . fexofenadine (ALLEGRA) 180 MG tablet Take 180 mg by mouth daily.    .  folic acid (FOLVITE) 1 MG tablet Take 1 tablet (1 mg total) by mouth daily. 30 tablet 3  . montelukast (SINGULAIR) 10 MG tablet Take 1 tablet (10 mg total) by mouth at bedtime. 90 tablet 1  . Multiple Vitamins-Calcium (VIACTIV MULTI-VITAMIN) CHEW Chew 1 tablet by mouth 4 (four) times daily.      [name] omeprazole (PRILOSEC) 40 MG capsule Take 1 capsule by mouth daily.    [name] PREMARIN 0.3 MG tablet Take 0.3 mg by mouth daily.      No current facility-administered medications for this visit.    Allergies  Allergen Reactions  . Cortisone     unsure  . Prednisone   . Sulfonamide Derivatives     Unsure of reaction     Review of Systems: All systems reviewed and negative except where noted in HPI.   CBC Latest Ref Rng & Units 02/02/2016 01/02/2016 10/30/2015  WBC 4.0 - 10.5 K/uL 8.2 8.6 16.5(H)  Hemoglobin 12.0 - 15.0 g/dL 11.0(L) 11.4(L) 12.1  Hematocrit 36.0 - 46.0 % 34.3(L) 35.5(L) 38.0  Platelets 150.0 - 400.0 K/uL 433.0(H) 471.0(H) 500.0(H)    Lab Results  Component Value Date   CREATININE 0.61 06/22/2016   BUN 14 06/22/2016   NA 139 08/14/2010   K 5.1 08/14/2010   CL 100 08/14/2010   CO2 32 08/14/2010     Physical Exam: BP 106/70 (BP Location: Left Arm, Patient Position: Sitting, Cuff Size: Normal)   Pulse 68   Ht 5\' 4"  (1.626 m)   Wt 96 lb 6.4 oz (43.7 kg)   BMI 16.55 kg/m  Constitutional: Pleasant,well-developed, female in no acute distress. HEENT: Normocephalic and atraumatic. Conjunctivae are normal. No scleral icterus. Neck supple.  Cardiovascular: Normal rate, regular rhythm.  Pulmonary/chest: Effort normal and breath sounds normal.  Abdominal: Soft, nondistended, nontender.  There are no masses palpable. Extremities: no edema Lymphadenopathy: No cervical adenopathy noted. Neurological: Alert and oriented to person place and time. Skin: Skin is warm and dry. No rashes noted. Psychiatric: Normal mood and affect. Behavior is normal.   ASSESSMENT AND PLAN: 75 y/o  female here for reassessment of the following issues:  GERD / dysphagia - severe reflux, along with nutcracker esophagus which I suspect is the likely cause for her dysphagia. Gastric emptying study negative. She has responded well to Dexilant in the past, will placed her back on 60mg  / day dosing for 30 days and see if she notes improvement, can refill if this works for her. We discussed long term risks of PPIs, which she verbalized understanding, and wished to proceed with therapy given her severe reflux. Recommend baseline DEXA scan and she wishes to discuss this with PCM. Use lowest dose  of PPI needed to control symptoms. If symptoms persist despite PPI, may consider TCA. We otherwise discussed options to treat nutcracker esophagus, she is willing to try low dose diltiazem to see if this helps. Will start at 60mg  po q day to BID to see if she tolerates it, dosing up to 60mg  every 6 hours as tolerated. She will let us know if this fails to help her symptoms.   Anemia - iron deficiency, responsive to oral iron. Endoscopic workup as outlined above without clear etiology. Continue iron PRN. She reports following Hgb with PCM. Recall colonoscopy in 08/2018 for multiple adenomas. If she has worsening anemia despite iron, she should follow up with me for reassessment.   [place] Cellar, MD [facility] Pager [phone]

## 2016-09-15 ENCOUNTER — Telehealth: Payer: Self-pay

## 2016-09-15 NOTE — Telephone Encounter (Signed)
Thanks Caryl Pina, I should have clarified when it was ordered, not your fault. Thanks

## 2016-09-15 NOTE — Telephone Encounter (Signed)
[facility] [name] had questions regarding Diltiazem. Instructions confirmed to take 60mg  every 6 hours as needed. (form sent to be scanned)

## 2016-09-15 NOTE — Telephone Encounter (Signed)
Dr Havery Moros, I spoke with [facility] at [facility]. Rx changed to short acting. Diltiazem 60mg  po q6h #90 with 1 refill. Sorry.

## 2016-09-15 NOTE — Telephone Encounter (Signed)
-----   Message from Manus Gunning, MD sent at 09/14/2016 10:10 PM EDT ----- Fran Lowes, I should have specified for this patient that her diltiazem 60mg  po q 6 hours was the SHORT acting diltiazem. I think we may have put in 12 or 24 hour diltiazem which may be the longer acting form and may be too strong for her. Can you please clarify what she is ordered and change to short acting version? Thanks and sorry for the confusion.   Dr. Loni Muse

## 2016-10-06 ENCOUNTER — Telehealth: Payer: Self-pay | Admitting: Pulmonary Disease

## 2016-10-06 NOTE — Telephone Encounter (Signed)
LMOMTCB x 1 

## 2016-10-08 MED ORDER — DOXYCYCLINE HYCLATE 100 MG PO TABS: 100.0000 mg | ORAL_TABLET | Freq: Two times a day (BID) | ORAL | 0 refills | Status: DC

## 2016-10-08 NOTE — Telephone Encounter (Signed)
Spoke with pt, aware of recs.  rx sent to preferred pharmacy.  Nothing further needed.  

## 2016-10-08 NOTE — Telephone Encounter (Signed)
Patient called still having chest congestion and cough - would like rx sent to [facility]

## 2016-10-08 NOTE — Telephone Encounter (Signed)
Pt c/o increased hoarseness, prod cough with gray mucus, increased SOB, chills X1 week. Unsure if she is running a fever. Pt has not taken anything to help with symptoms.  Requesting doxycycline rx.    Pt uses [facility].    Sending to DOD as VS is unavailable today.  MW please advise on recs.  Thanks.

## 2016-10-08 NOTE — Telephone Encounter (Signed)
Doxy 100 mg bid x 10 days  ?

## 2016-11-05 ENCOUNTER — Ambulatory Visit (INDEPENDENT_AMBULATORY_CARE_PROVIDER_SITE_OTHER): Payer: Medicare Other | Admitting: Neurology

## 2016-11-05 ENCOUNTER — Encounter: Payer: Self-pay | Admitting: Neurology

## 2016-11-05 VITALS — BP 108/72 | HR 107 | Ht 64.0 in | Wt 94.3 lb

## 2016-11-05 DIAGNOSIS — G459 Transient cerebral ischemic attack, unspecified: Secondary | ICD-10-CM | POA: Diagnosis not present

## 2016-11-05 DIAGNOSIS — G44209 Tension-type headache, unspecified, not intractable: Secondary | ICD-10-CM

## 2016-11-05 DIAGNOSIS — G629 Polyneuropathy, unspecified: Secondary | ICD-10-CM | POA: Diagnosis not present

## 2016-11-05 MED ORDER — FOLIC ACID 1 MG PO TABS: 1.0000 mg | ORAL_TABLET | Freq: Every day | ORAL | 3 refills | Status: DC

## 2016-11-05 NOTE — Patient Instructions (Signed)
1. Continue all your medications, including thiamine and folic acid 2. Continue to monitor neuropathy in legs, if significantly worsening, we have options to help relieve symptoms 3. Follow-up in 6 months, call for any changes

## 2016-11-05 NOTE — Progress Notes (Signed)
NEUROLOGY FOLLOW UP OFFICE NOTE  [name] [id number]  HISTORY OF PRESENT ILLNESS: I had the pleasure of seeing [name] in follow-up in the neurology clinic on 11/05/2016.  The patient was last seen 3 months ago after an episode of headache with dizziness and speech difficulty. She had a few more episodes of headaches with dizziness after that. MRI brain unremarkable. MRA head showed a short segment of moderate stenosis of the left P1 and diminished distal flow-related signal within the left PCA distribution. Her lipid panel showed a total cholesterol of 214, LDL 106. Her ESR and CRP were normal. No further similar episodes since June 2017. Her main concern on her last visit was the neuropathy in both feet, with burning, stinging pain, worse on the right foot. Both legs feel swollen. Neuropathy labs were ordered, which showed a low B1 (<7) and folate (4.3). Homocysteine level was elevated (17.1), normal MMA. She declined starting any medication and states that pain is tolerable. She has tingling below both ankles. She was started on daily thiamine and folic acid, and reports that when she stopped the folic acid, she started having a lot of cramps. She has headaches 3-4 times a week, which she feels are tension-related. Using a hand massager helps. Sleep is fair.   HPI: This is a pleasant 75 yo RH woman with a history of GERD, nutcracker esophagus, pulmonary fibrosis, in her usual state of health until early June 2017 while walking into a grocery she suddenly had left temporal pain, then it felt like she was hit on both side of the head with a hammer. She reports she was "hit like gangbusters," she was very dizzy with a spinning sensation, nauseated. She held on to the cart for balance and had difficulty concentrating. Her vision was blurred. She managed to finish her chore and walked to the car, then noticed that she was having a hard time talking. She could see the sentence broken up in her head  and could not get the words out. She was able to drive home with no focal symptoms noted. The episode lasted 30 minutes or so, she felt weak and tired after. Since then she has had at least 2 or 3 more episodes of headaches with dizziness, which did not progress to speech difficulties. The symptoms would last around 5 minutes, no clear positional component to the dizziness. When she feels it coming on, she massages her head, which seems to help. She was unsure if Tylenol helped. She denies any prior history of headaches. She denies any vision loss, photo/phonophobia, diplopia, dysarthria, jaw claudication, hearing loss. She has occasional pulsatile tinnitus more in the afternoons. She has GERD which makes swallowing difficult. She has a history of neuropathy affecting both feet, R>L, with burning and numbness/tingling for several years. She has been doing laser therapy for venous insufficiency/varicose veins. She denies any falls but trips more. Sleep varies. She is very active and rides her bike for a couple of hours daily. She works at a preschool. Her mother had a stroke at age 24.   PAST MEDICAL HISTORY: Past Medical History:  Diagnosis Date  . Allergy   . Arthritis   . BOOP (bronchiolitis obliterans with organizing pneumonia) ([place])   . Bronchiectasis   . Esophageal dysmotility   . Gastroparesis   . GERD (gastroesophageal reflux disease)    severe  . Iron deficiency anemia   . Neuropathy ([place])   . Nutcracker esophagus   . Pneumonia   .  Pulmonary fibrosis ([place])   . Raynaud's disease   . Right lower lobe pneumonia December 2013    MEDICATIONS:  Outpatient Encounter Prescriptions as of 11/05/2016  Medication Sig Note  . Acetaminophen (TYLENOL EXTRA STRENGTH PO) Take 350 mg by mouth 3 (three) times daily as needed.    [name] aspirin EC 81 MG tablet Take 81 mg by mouth daily.   . benzonatate (TESSALON) 200 MG capsule Take 1 capsule (200 mg total) by mouth 3 (three) times daily as needed for  cough.   . Calcium-Vitamin D-Vitamin K (VIACTIV PO) Take by mouth.   . dexlansoprazole (DEXILANT) 60 MG capsule Take 1 capsule (60 mg total) by mouth daily.   [name] diltiazem (CARDIZEM SR) 60 MG 12 hr capsule Take 1 capsule (60 mg total) by mouth every 6 (six) hours as needed.   . doxycycline (VIBRA-TABS) 100 MG tablet Take 1 tablet (100 mg total) by mouth 2 (two) times daily.   . fexofenadine (ALLEGRA) 180 MG tablet Take 180 mg by mouth daily.   . folic acid (FOLVITE) 1 MG tablet Take 1 tablet (1 mg total) by mouth daily.   . montelukast (SINGULAIR) 10 MG tablet Take 1 tablet (10 mg total) by mouth at bedtime.   . Multiple Vitamins-Calcium (VIACTIV MULTI-VITAMIN) CHEW Chew 1 tablet by mouth 4 (four) times daily.     [name] omeprazole (PRILOSEC) 40 MG capsule Take 1 capsule by mouth daily. 09/14/2016: Received from: External Pharmacy  . PREMARIN 0.3 MG tablet Take 0.3 mg by mouth daily.  05/28/2015: Received from: External Pharmacy  . Thiamine Mononitrate (VITAMIN B1 PO) Take by mouth.    No facility-administered encounter medications on file as of 11/05/2016.     ALLERGIES: Allergies  Allergen Reactions  . Cortisone     unsure  . Prednisone   . Sulfonamide Derivatives     Unsure of reaction    FAMILY HISTORY: Family History  Problem Relation Age of Onset  . Lymphoma Mother   . Heart disease Father   . Lung cancer Paternal Uncle   . Heart disease Paternal Grandfather   . Leukemia Paternal Grandmother   . Cancer Maternal Grandmother     unknown type  . Colon cancer Neg Hx   . Esophageal cancer Neg Hx   . Rectal cancer Neg Hx   . Stomach cancer Neg Hx     SOCIAL HISTORY: Social History   Social History  . Marital status: Widowed    Spouse name: N/A  . Number of children: 2  . Years of education: N/A   Occupational History  . retired Pharmacist, hospital   .  Retired   Social History Main Topics  . Smoking status: Former Smoker    Types: Cigarettes    Quit date: 11/15/1968  . Smokeless  tobacco: Never Used     Comment: 3 cigs a day  . Alcohol use No  . Drug use: No  . Sexual activity: Not on file   Other Topics Concern  . Not on file   Social History Narrative  . No narrative on file    REVIEW OF SYSTEMS: Constitutional: No fevers, chills, or sweats, no generalized fatigue, change in appetite Eyes: No visual changes, double vision, eye pain Ear, nose and throat: No hearing loss, ear pain, nasal congestion, sore throat Cardiovascular: No chest pain, palpitations Respiratory:  No shortness of breath at rest or with exertion, wheezes GastrointestinaI: No nausea, vomiting, diarrhea, abdominal pain, fecal incontinence Genitourinary:  No dysuria, urinary  retention or frequency Musculoskeletal:  + neck pain, back pain Integumentary: No rash, pruritus, skin lesions Neurological: as above Psychiatric: No depression, insomnia, anxiety Endocrine: No palpitations, fatigue, diaphoresis, mood swings, change in appetite, change in weight, increased thirst Hematologic/Lymphatic:  No anemia, purpura, petechiae. Allergic/Immunologic: no itchy/runny eyes, nasal congestion, recent allergic reactions, rashes  PHYSICAL EXAM: Vitals:   11/05/16 1520  BP: 108/72  Pulse: (!) 107   General: No acute distress Head:  Normocephalic/atraumatic Neck: supple, no paraspinal tenderness, full range of motion Heart:  Regular rate and rhythm Lungs:  Clear to auscultation bilaterally Back: No paraspinal tenderness Skin/Extremities: No rash, no edema Neurological Exam: alert and oriented to person, place, and time. No aphasia or dysarthria. Fund of knowledge is appropriate.  Recent and remote memory are intact.  Attention and concentration are normal.    Able to name objects and repeat phrases. Cranial nerves: Pupils equal, round, reactive to light. Extraocular movements intact with no nystagmus. Visual fields full. Facial sensation intact. No facial asymmetry. Tongue, uvula, palate midline.   Motor: Bulk and tone normal, muscle strength 5/5 throughout with no pronator drift.  Sensation intact to all modalities on both UE, decreased vibration and pin to knees bilaterally (similar to prior).  Deep tendon reflexes unable to elicit, toes downgoing.  Finger to nose testing intact.  Gait narrow-based and steady, difficulty with tandem walk.  Romberg positive sway.  IMPRESSION: This is a pleasant 75 yo RH woman with a history of GERD, nutcracker esophagus, pulmonary fibrosis, who had a transient episode of headache, vertigo, and expressive aphasia last June 2017, concerning for TIA. MRI brain no acute changes. Her MRA head and neck was unremarkable except for moderate stenosis of the left P1 and diminished distal flow-related signal within the left PCA distribution. Continue daily aspirin and control of vascular risk factors. No further episodes since June 2017. Her main concern today is the neuropathy, labs showed low B1 and folate levels, she is now taking daily supplements. She is also reporting frequent headaches. We discussed starting gabapentin for both headaches and neuropathic pain, she declines for now and knows to call for any changes in symptoms. She will follow-up in 6 months  Thank you for allowing me to participate in her care.  Please do not hesitate to call for any questions or concerns.  The duration of this appointment visit was 25 minutes of face-to-face time with the patient.  Greater than 50% of this time was spent in counseling, explanation of diagnosis, planning of further management, and coordination of care.   [name], M.D.   CC: Dr. Felipa Eth

## 2016-11-09 ENCOUNTER — Telehealth: Payer: Self-pay | Admitting: Pulmonary Disease

## 2016-11-09 NOTE — Telephone Encounter (Signed)
Spoke with pt. She is requesting an appointment today or tomorrow for her symptoms. Pt has been scheduled with MW on 11/10/16 at 11:45am. Nothing further was needed.

## 2016-11-10 ENCOUNTER — Ambulatory Visit (INDEPENDENT_AMBULATORY_CARE_PROVIDER_SITE_OTHER)
Admission: RE | Admit: 2016-11-10 | Discharge: 2016-11-10 | Disposition: A | Discharge status: Home / Self Care | Payer: Medicare Other | Source: Ambulatory Visit | Attending: Internal Medicine | Admitting: Internal Medicine

## 2016-11-10 ENCOUNTER — Ambulatory Visit (INDEPENDENT_AMBULATORY_CARE_PROVIDER_SITE_OTHER): Payer: Medicare Other | Admitting: Internal Medicine

## 2016-11-10 ENCOUNTER — Encounter: Payer: Self-pay | Admitting: Internal Medicine

## 2016-11-10 VITALS — BP 104/70 | HR 71 | Temp 97.4°F | Ht 64.0 in | Wt 95.4 lb

## 2016-11-10 DIAGNOSIS — J471 Bronchiectasis with (acute) exacerbation: Secondary | ICD-10-CM | POA: Diagnosis not present

## 2016-11-10 DIAGNOSIS — K219 Gastro-esophageal reflux disease without esophagitis: Secondary | ICD-10-CM

## 2016-11-10 MED ORDER — LEVOFLOXACIN 500 MG PO TABS: 500.0000 mg | ORAL_TABLET | Freq: Every day | ORAL | 0 refills | Status: DC

## 2016-11-10 NOTE — Progress Notes (Signed)
Tests PFT 12/31/09 >> FEV1 1.84(84%), FEV1% 73, TLC 4.94(98%), DLCO 77%, +BD. CT chest 08/17/10 >> mild biapical scarring, basilar peribronchovascular nodularity and mild bronchiectasis. Echo 06/04/15 >> EF 55%, probable PFO  Past medical history GERD, Nutcracker esophagus, Iron deficiency anemia, Gastroparesis, Raynaud's, Neuropathy Rt leg, endometriosis  Past surgical history, Family history, Social history, Allergies reviewed.  Vital signs BP 102/70 mmHg  Pulse 66  Ht 5\' 4"  (1.626 m)  Wt 93 lb 3.2 oz (42.275 kg)  BMI 15.99 kg/m2  SpO2 100%   History of Present Illness: [name] is a  54 . female former smoker with bronchiectasis, and recurrent aspiration.  She has prior history of cryptogenic organizing pneumonia.  She was found to have nutcracker esophagus.  She has sore throat and trouble swallowing.  She needed Abx in April.  Not as much cough or sputum now.  She keeps up with her exercise regimen.  She had episode of dizziness/confusion >> has appt with neurology. rec    11/10/2016 acute extended ov/[facility] re: bronchiectasis flare  Chief Complaint  Patient presents with  . Acute Visit    Pt c/o fatigue, cough, chest soreness, sore throat and hoarseness for the past 3 wks. She was coughing up grey sputum, but more recently she is not coughing up anything.   recurrent pain exact same location R of midline where has had it with past bronchiectasis flares  assoc with grey mucus and chills  But no sob over baseline - onset of illness was subacute and gradually worse cough to point where severe, 24/7   No obvious day to day or daytime variability or assoc   mucus plugs or hemoptysis or   chest tightness, subjective wheeze or overt sinus or hb symptoms. No unusual exp hx or h/o childhood pna/ asthma or knowledge of premature birth.  Sleeping ok without nocturnal  or early am exacerbation  of respiratory  c/o's or need for noct saba. Also denies any obvious fluctuation of  symptoms with weather or environmental changes or other aggravating or alleviating factors except as outlined above   Current Medications, Allergies, Complete Past Medical History, Past Surgical History, Family History, and Social History were reviewed in [employer] record.  ROS  The following are not active complaints unless bolded sore throat, dysphagia, dental problems, itching, sneezing,  nasal congestion or excess/ purulent secretions, ear ache,   fever, chills, sweats, unintended wt loss, classically pleuritic or exertional cp,  orthopnea pnd or leg swelling, presyncope, palpitations, abdominal pain, anorexia, nausea, vomiting, diarrhea  or change in bowel or bladder habits, change in stools or urine, dysuria,hematuria,  rash, arthralgias, visual complaints, headache, numbness, weakness or ataxia or problems with walking or coordination,  change in mood/affect or memory.          Physical Exam:  Wt Readings from Last 3 Encounters:  11/10/16 95 lb 6.4 oz (43.3 kg)  11/05/16 94 lb 5 oz (42.8 kg)  09/14/16 96 lb 6.4 oz (43.7 kg)    Vital signs reviewed - Note on arrival 02 sats  99% on RA    General - thin, no distress HEENT - no sinus tenderness, no LAN, no oral exudate Cardiac - s1s2 regular, no murmur Chest -mild insp and exp rhonchi bilaterally  Abdomen - soft, nontender Extremities - no edema Skin - no rashes Neurologic - normal strength Psychiatric - normal mood, behavior   CXR PA and Lateral:   11/10/2016 :    I personally reviewed  images and agree with radiology impression as follows:   Hazy left lower lobe airspace disease concerning for pneumonia. Followup PA and lateral chest X-ray is recommended in 3-4 weeks following trial of antibiotic therapy to ensure resolution and exclude underlying malignancy     Assessment/Plan:

## 2016-11-10 NOTE — Progress Notes (Signed)
Spoke with pt and notified of results per Dr. Wert. Pt verbalized understanding and denied any questions. 

## 2016-11-10 NOTE — Patient Instructions (Signed)
Continue dexilant Take 30- 60 min before your first and last meals of the day   levaquin 500 mg daily x 7 days   For cough > mucinex dm 1200 mg evey 12 hours as needed  (ok to add pearls too)  GERD (REFLUX)  is an extremely common cause of respiratory symptoms just like yours , many times with no obvious heartburn at all.    It can be treated with medication, but also with lifestyle changes including elevation of the head of your bed (ideally with 6 inch  bed blocks),  Smoking cessation, avoidance of late meals, excessive alcohol, and avoid fatty foods, chocolate, peppermint, colas, red wine, and acidic juices such as orange juice.  NO MINT OR MENTHOL PRODUCTS SO NO COUGH DROPS   USE SUGARLESS CANDY INSTEAD (Jolley ranchers or Stover's or Life Savers) or even ice chips will also do - the key is to swallow to prevent all throat clearing. NO OIL BASED VITAMINS - use powdered substitutes.  Please remember to go to the x-ray department downstairs for your tests - we will call you with the results when they are available.     [name]

## 2016-11-12 ENCOUNTER — Encounter: Payer: Self-pay | Admitting: Neurology

## 2016-11-14 ENCOUNTER — Encounter: Payer: Self-pay | Admitting: Internal Medicine

## 2016-11-14 NOTE — Assessment & Plan Note (Signed)
Reviewed relationship between GERD and bronchiectasis/ cough emphasizing that one can exacerbate the other and reviewing approp timing for ppi/ approp diet

## 2016-11-14 NOTE — Assessment & Plan Note (Addendum)
Acute flare with cp in same area as previous but with vague density in LLL and can't exclude pna  rec levaquin 500 mg daily x 7 days then return for f/u cxr w/in 4 weeks, and call in meantime if symptoms don't improve    I had an extended discussion with the patient reviewing all relevant studies completed to date and  lasting 15 minutes of a 25 minute acute  visit for pt not previously known to me  re  non-specific but potentially very serious pulmonary symptoms of unknown etiology.  Offered opioid meds for cough / cp from coughing and she declined/ reviewed otc rx  Each maintenance medication was reviewed in detail including most importantly the difference between maintenance and prns and under what circumstances the prns are to be triggered using an action plan format that is not reflected in the computer generated alphabetically organized AVS.    Please see AVS for unique instructions that I personally wrote and verbalized to the the pt in detail and then reviewed with pt  by my nurse highlighting any  changes in therapy recommended at today's visit to their plan of care.

## 2016-12-03 ENCOUNTER — Telehealth: Payer: Self-pay | Admitting: Pulmonary Disease

## 2016-12-03 NOTE — Telephone Encounter (Signed)
lmtcb x1 for pt. 

## 2016-12-06 NOTE — Telephone Encounter (Signed)
VS are you ok with this appt being scheduled out in march.  Please advise. Thanks

## 2016-12-06 NOTE — Telephone Encounter (Signed)
Okay to double book visit with me. 

## 2016-12-06 NOTE — Telephone Encounter (Signed)
Patient is calling to follow up about scheduling an appointment with Dr. Halford Chessman before March. Patient demand to see Dr. Halford Chessman only.

## 2016-12-06 NOTE — Telephone Encounter (Signed)
lmtcb x1 for pt. 

## 2016-12-06 NOTE — Telephone Encounter (Signed)
Spoke with pt, rov scheduled.  Nothing further needed.  

## 2016-12-06 NOTE — Telephone Encounter (Signed)
lmtcb X1 

## 2016-12-09 ENCOUNTER — Ambulatory Visit (INDEPENDENT_AMBULATORY_CARE_PROVIDER_SITE_OTHER): Payer: Medicare Other

## 2016-12-09 ENCOUNTER — Ambulatory Visit: Payer: Self-pay | Admitting: Adult Health

## 2016-12-09 DIAGNOSIS — Z23 Encounter for immunization: Secondary | ICD-10-CM

## 2016-12-10 ENCOUNTER — Ambulatory Visit: Payer: Self-pay | Admitting: Adult Health

## 2016-12-14 ENCOUNTER — Ambulatory Visit (INDEPENDENT_AMBULATORY_CARE_PROVIDER_SITE_OTHER): Payer: Medicare Other | Admitting: Adult Health

## 2016-12-14 ENCOUNTER — Ambulatory Visit (INDEPENDENT_AMBULATORY_CARE_PROVIDER_SITE_OTHER)
Admission: RE | Admit: 2016-12-14 | Discharge: 2016-12-14 | Disposition: A | Discharge status: Home / Self Care | Payer: Medicare Other | Source: Ambulatory Visit | Attending: Adult Health | Admitting: Adult Health

## 2016-12-14 ENCOUNTER — Encounter: Payer: Self-pay | Admitting: Adult Health

## 2016-12-14 VITALS — BP 110/60 | HR 68 | Temp 97.9°F | Ht 64.0 in

## 2016-12-14 DIAGNOSIS — J189 Pneumonia, unspecified organism: Secondary | ICD-10-CM

## 2016-12-14 DIAGNOSIS — J479 Bronchiectasis, uncomplicated: Secondary | ICD-10-CM

## 2016-12-14 NOTE — Patient Instructions (Signed)
Continue on current regimen  Chest xray today  Follow up with Dr. Halford Chessman  As planned and As needed

## 2016-12-14 NOTE — Progress Notes (Signed)
@Patient  ID: [name], female    DOB: 03-27-1941, 76 y.o.   MRN: [id number]  Chief Complaint  Patient presents with  . Follow-up    PNA     Referring provider: [name], MD  HPI: 76 yo female former smoker followed for bronchiectasis and recurrent aspiration . Prior hx of cryptogenic organizing pneumonia   TEST  PFT 12/31/09 >> FEV1 1.84(84%), FEV1% 73, TLC 4.94(98%), DLCO 77%, +BD. CT chest 08/17/10 >> mild biapical scarring, basilar peribronchovascular nodularity and mild bronchiectasis. Echo 06/04/15 >> EF 55%, probable PFO  12/14/2016 Follow up : PNA Pt returns for 1 month follow up for Pneumonia . She was seen last ov and found to have a LLL PNA . She was treated with Levaquin for 7 days . She says she is feeling better with less cough and congestion .  Energy level is returning . She exercises everyday.  She denies chest pain, n/v./d.      Allergies  Allergen Reactions  . Cortisone     unsure  . Prednisone   . Sulfonamide Derivatives     Unsure of reaction    Immunization History  Administered Date(s) Administered  . Influenza Split 11/06/2011, 11/06/2012  . Influenza Whole 10/29/2009, 08/15/2010  . Influenza, High Dose Seasonal PF 12/09/2016  . Influenza,inj,Quad PF,36+ Mos 10/15/2014, 11/22/2014, 11/19/2015  . Pneumococcal Conjugate-13 09/17/2013  . Pneumococcal Polysaccharide-23 10/29/2004, 01/08/2015    Past Medical History:  Diagnosis Date  . Allergy   . Arthritis   . BOOP (bronchiolitis obliterans with organizing pneumonia) ([place])   . Bronchiectasis   . Esophageal dysmotility   . Gastroparesis   . GERD (gastroesophageal reflux disease)    severe  . Iron deficiency anemia   . Neuropathy ([place])   . Nutcracker esophagus   . Pneumonia   . Pulmonary fibrosis ([place])   . Raynaud's disease   . Right lower lobe pneumonia December 2013    Tobacco History: History  Smoking Status  . Former Smoker  . Types: Cigarettes  . Quit date:  11/15/1968  Smokeless Tobacco  . Never Used    Comment: 3 cigs a day   Counseling given: Not Answered   Outpatient Encounter Prescriptions as of 12/14/2016  Medication Sig  . Acetaminophen (TYLENOL EXTRA STRENGTH PO) Take 350 mg by mouth 3 (three) times daily as needed.   [name] aspirin EC 81 MG tablet Take 81 mg by mouth daily.  . benzonatate (TESSALON) 200 MG capsule Take 1 capsule (200 mg total) by mouth 3 (three) times daily as needed for cough.  . Calcium-Vitamin D-Vitamin K (VIACTIV PO) Take by mouth.  . dexlansoprazole (DEXILANT) 60 MG capsule Take 1 capsule (60 mg total) by mouth daily.  . fexofenadine (ALLEGRA) 180 MG tablet Take 180 mg by mouth daily.  . folic acid (FOLVITE) 1 MG tablet Take 1 tablet (1 mg total) by mouth daily.  . montelukast (SINGULAIR) 10 MG tablet Take 1 tablet (10 mg total) by mouth at bedtime.  . Multiple Vitamins-Calcium (VIACTIV MULTI-VITAMIN) CHEW Chew 1 tablet by mouth 4 (four) times daily.    [name] omeprazole (PRILOSEC) 40 MG capsule Take 1 capsule by mouth daily.  [name] PREMARIN 0.3 MG tablet Take 0.3 mg by mouth daily.   . Thiamine Mononitrate (VITAMIN B1 PO) Take by mouth.  . [DISCONTINUED] levofloxacin (LEVAQUIN) 500 MG tablet Take 1 tablet (500 mg total) by mouth daily.   No facility-administered encounter medications on file as of 12/14/2016.  Review of Systems  Constitutional:   No  weight loss, night sweats,  Fevers, chills, fatigue, or  lassitude.  HEENT:   No headaches,  Difficulty swallowing,  Tooth/dental problems, or  Sore throat,                No sneezing, itching, ear ache, nasal congestion, post nasal drip,   CV:  No chest pain,  Orthopnea, PND, swelling in lower extremities, anasarca, dizziness, palpitations, syncope.   GI  No heartburn, indigestion, abdominal pain, nausea, vomiting, diarrhea, change in bowel habits, loss of appetite, bloody stools.   Resp:  .  No wheezing.  No chest wall deformity  Skin: no rash or lesions.  GU:  no dysuria, change in color of urine, no urgency or frequency.  No flank pain, no hematuria   MS:  No joint pain or swelling.  No decreased range of motion.  No back pain.    Physical Exam  BP 110/60   Pulse 68   Temp 97.9 F (36.6 C) (Oral)   Ht 5\' 4"  (1.626 m)   SpO2 98%   GEN: A/Ox3; pleasant , NAD, thin     HEENT:  [place]/AT,  EACs-clear, TMs-wnl, NOSE-clear, THROAT-clear, no lesions, no postnasal drip or exudate noted.   NECK:  Supple w/ fair ROM; no JVD; normal carotid impulses w/o bruits; no thyromegaly or nodules palpated; no lymphadenopathy.    RESP  Decreased BS in bases . no accessory muscle use, no dullness to percussion  CARD:  RRR, no m/r/g, no peripheral edema, pulses intact, no cyanosis or clubbing.  GI:   Soft & nt; nml bowel sounds; no organomegaly or masses detected.   Musco: Warm bil, no deformities or joint swelling noted.   Neuro: alert, no focal deficits noted.    Skin: Warm, no lesions or rashes  Psych:  No change in mood or affect. No depression or anxiety.  No memory loss.  Lab Results:   Assessment & Plan:   Pneumonia LLL PNA clinically improved after abx  Check cxr today for clearance   Plan  Patient Instructions  Continue on current regimen  Chest xray today  Follow up with Dr. Halford Chessman  As planned and As needed      BRONCHIECTASIS Recent flare w/ PNA -resolved  Cont w/ current regimen      Rexene Edison, NP 12/14/2016

## 2016-12-14 NOTE — Assessment & Plan Note (Signed)
LLL PNA clinically improved after abx  Check cxr today for clearance   Plan  Patient Instructions  Continue on current regimen  Chest xray today  Follow up with Dr. Halford Chessman  As planned and As needed

## 2016-12-14 NOTE — Assessment & Plan Note (Signed)
Recent flare w/ PNA -resolved  Cont w/ current regimen

## 2016-12-15 NOTE — Progress Notes (Signed)
I have reviewed and agree with assessment/plan.  Chesley Mires, MD [facility] Pulmonary/Critical Care 12/15/2016, 3:46 PM Pager:  [phone]

## 2016-12-16 NOTE — Progress Notes (Signed)
LMOMTCB x 1 

## 2016-12-20 ENCOUNTER — Other Ambulatory Visit: Payer: Self-pay | Admitting: Internal Medicine

## 2016-12-20 DIAGNOSIS — R0600 Dyspnea, unspecified: Secondary | ICD-10-CM

## 2016-12-21 ENCOUNTER — Ambulatory Visit: Payer: Self-pay | Admitting: Pulmonary Disease

## 2016-12-21 ENCOUNTER — Ambulatory Visit (INDEPENDENT_AMBULATORY_CARE_PROVIDER_SITE_OTHER): Payer: Medicare Other | Admitting: Internal Medicine

## 2016-12-21 DIAGNOSIS — R0600 Dyspnea, unspecified: Secondary | ICD-10-CM | POA: Diagnosis not present

## 2016-12-21 LAB — PULMONARY FUNCTION TEST
DL/VA % pred: 72 %
DL/VA: 3.49 ml/min/mmHg/L
DLCO cor % pred: 49 %
DLCO cor: 11.89 ml/min/mmHg
DLCO unc % pred: 51 %
DLCO unc: 12.47 ml/min/mmHg
FEF 25-75 Post: 0.96 L/sec
FEF 25-75 Pre: 0.73 L/sec
FEF2575-%Change-Post: 31 %
FEF2575-%PRED-PRE: 41 %
FEF2575-%Pred-Post: 55 %
FEV1-%Change-Post: 7 %
FEV1-%PRED-PRE: 64 %
FEV1-%Pred-Post: 69 %
FEV1-POST: 1.49 L
FEV1-Pre: 1.39 L
FEV1FVC-%Change-Post: 18 %
FEV1FVC-%Pred-Pre: 88 %
FEV6-%Change-Post: -8 %
FEV6-%PRED-POST: 69 %
FEV6-%PRED-PRE: 76 %
FEV6-POST: 1.91 L
FEV6-Pre: 2.09 L
FEV6FVC-%CHANGE-POST: 0 %
FEV6FVC-%PRED-POST: 105 %
FEV6FVC-%Pred-Pre: 104 %
FVC-%CHANGE-POST: -8 %
FVC-%PRED-POST: 66 %
FVC-%Pred-Pre: 72 %
FVC-PRE: 2.1 L
FVC-Post: 1.91 L
POST FEV6/FVC RATIO: 100 %
PRE FEV1/FVC RATIO: 66 %
Post FEV1/FVC ratio: 78 %
Pre FEV6/FVC Ratio: 99 %
RV % pred: 158 %
RV: 3.56 L
TLC % PRED: 107 %
TLC: 5.42 L

## 2016-12-23 ENCOUNTER — Ambulatory Visit (INDEPENDENT_AMBULATORY_CARE_PROVIDER_SITE_OTHER): Payer: Medicare Other | Admitting: Pulmonary Disease

## 2016-12-23 ENCOUNTER — Encounter: Payer: Self-pay | Admitting: Pulmonary Disease

## 2016-12-23 VITALS — BP 118/78 | HR 68 | Ht 65.0 in | Wt 97.8 lb

## 2016-12-23 DIAGNOSIS — J479 Bronchiectasis, uncomplicated: Secondary | ICD-10-CM

## 2016-12-23 DIAGNOSIS — M62830 Muscle spasm of back: Secondary | ICD-10-CM

## 2016-12-23 DIAGNOSIS — J301 Allergic rhinitis due to pollen: Secondary | ICD-10-CM | POA: Diagnosis not present

## 2016-12-23 MED ORDER — CYCLOBENZAPRINE HCL 5 MG PO TABS: 5.0000 mg | ORAL_TABLET | Freq: Three times a day (TID) | ORAL | 0 refills | Status: DC | PRN

## 2016-12-23 NOTE — Progress Notes (Signed)
Current Outpatient Prescriptions on File Prior to Visit  Medication Sig  . Acetaminophen (TYLENOL EXTRA STRENGTH PO) Take 350 mg by mouth 3 (three) times daily as needed.   [name] aspirin EC 81 MG tablet Take 81 mg by mouth daily.  . benzonatate (TESSALON) 200 MG capsule Take 1 capsule (200 mg total) by mouth 3 (three) times daily as needed for cough.  . Calcium-Vitamin D-Vitamin K (VIACTIV PO) Take by mouth.  . dexlansoprazole (DEXILANT) 60 MG capsule Take 1 capsule (60 mg total) by mouth daily.  . fexofenadine (ALLEGRA) 180 MG tablet Take 180 mg by mouth daily.  . folic acid (FOLVITE) 1 MG tablet Take 1 tablet (1 mg total) by mouth daily.  . montelukast (SINGULAIR) 10 MG tablet Take 1 tablet (10 mg total) by mouth at bedtime.  . Multiple Vitamins-Calcium (VIACTIV MULTI-VITAMIN) CHEW Chew 1 tablet by mouth 4 (four) times daily.    [name] omeprazole (PRILOSEC) 40 MG capsule Take 1 capsule by mouth daily.  [name] PREMARIN 0.3 MG tablet Take 0.3 mg by mouth daily.   . Thiamine Mononitrate (VITAMIN B1 PO) Take by mouth.   No current facility-administered medications on file prior to visit.     Chief Complaint  Patient presents with  . Follow-up    Pt states that she thinks that she pulled at muscle doing her PFT on 12/21/16. Pt states that she has been unable to sleep that last 2 nights d/t severe pain; feels like knife cutting into her. Overall breathing is okay, increase in SOB with climbing stairs. Pt still coughing up thick dark mucus, recent CXR showed improved LLL PNA. Pt is concerned that the PNA is in the riht lung now.     Pulmonary tests PFT 12/31/09 >> FEV1 1.84(84%), FEV1% 73, TLC 4.94(98%), DLCO 77%, +BD. CT chest 08/17/10 >> mild biapical scarring, basilar peribronchovascular nodularity and mild bronchiectasis PFT 06/21/16 >> FEV1 1.22 (55%), FEV1% 76, TLC 5.29 (104%), DLCO 43% PFT 12/21/16 >> FEV1 1.49 (69%), FEV1% 78, TLC 5.42 (107%), RV 158%, DLCO 51%  Cardiac tests Echo 07/16/16 >> EF 60 to  65%, grade 2 DD, mild MR, small PFO, PAS 27 mmHg   Past medical history GERD, Nutcracker esophagus, Iron deficiency anemia, Gastroparesis, Raynaud's, Neuropathy Rt leg, endometriosis  Past surgical history, Family history, Social history, Allergies reviewed.  Vital signs BP 118/78 (BP Location: Left Arm, Cuff Size: Normal)   Pulse 68   Ht 5\' 5"  (1.651 m)   Wt 97 lb 12.8 oz (44.4 kg)   SpO2 96%   BMI 16.27 kg/m    History of Present Illness: [name] is a 76 y.o. female former smoker with bronchiectasis, and recurrent aspiration.  She has prior history of cryptogenic organizing pneumonia.  She was treated for pneumonia in January.  Symptoms improved with Abx.  Not having cough, wheeze, sputum, chest pain, fever, or hemoptysis.  She still has sinus congestion and post nasal drip.  She had PFT on 12/21/16.  No significant progression, and reviewed with her.  She pulled a muscle in her back during PFT and hasn't been able to find comfortable position since.  Physical Exam:  General - pleasant, thin Eyes - wears glasses ENT - mild maxillary sinus tenderness, no nasal drainage, no oral exudate, no LAN Cardiac - regular, no murmur Chest - no wheeze, rales Back - tender on palpation over paraspinal muscle on right Abdomen - soft, non tender Extremities - no edema Skin - no rashes Neurologic - normal  strength Psychiatric - normal mood  Assessment/Plan:  Back muscle spasm. - prn flexeril, tylenol - she can't use po NSAIDS due to GI side effects - heating pad prn - topical analgesic creams - she is to call if not improving  Bronchiectasis related to prior episodes of PNA and BOOP. - repeat PFT stable - clinically improved from recent episode of pneumonia - continue singulair and prn albuterol - continue singulair and prn albuterol  Allergic rhinitis. - she can try nasal irrigation - continue singulair, allegra   Patient Instructions  Flexeril 5 mg three times per  day as needed for muscle spasm  Call if not feeling better in next few days  Saline nasal spray daily  Follow up in 6 months    Chesley Mires, MD [place] 12/23/2016, 12:36 PM Pager:  [phone]

## 2016-12-23 NOTE — Patient Instructions (Addendum)
Flexeril 5 mg three times per day as needed for muscle spasm  Call if not feeling better in next few days  Saline nasal spray daily  Follow up in 6 months

## 2016-12-27 NOTE — Progress Notes (Signed)
Left voicemail for patient to call for medical results.

## 2016-12-28 NOTE — Progress Notes (Signed)
LMTCB

## 2016-12-29 ENCOUNTER — Telehealth: Payer: Self-pay | Admitting: Pulmonary Disease

## 2016-12-29 NOTE — Telephone Encounter (Signed)
Per Carleigh's documentation, pt was returning call for CXR results. She has been made aware of them. Nothing further was needed.

## 2016-12-29 NOTE — Progress Notes (Signed)
Left message for patient to call office for medical results.

## 2016-12-29 NOTE — Progress Notes (Signed)
Spoke with patient and informed her of results, continuation of ov recommendations, and to contact office if symptoms do not improve. Pt did not have any questions. Nothing further is needed.

## 2017-02-21 ENCOUNTER — Other Ambulatory Visit: Payer: Self-pay

## 2017-02-21 DIAGNOSIS — G629 Polyneuropathy, unspecified: Secondary | ICD-10-CM

## 2017-02-21 MED ORDER — FOLIC ACID 1 MG PO TABS: 1.0000 mg | ORAL_TABLET | Freq: Every day | ORAL | 3 refills | Status: DC

## 2017-03-19 ENCOUNTER — Other Ambulatory Visit: Payer: Self-pay | Admitting: Gastroenterology

## 2017-04-21 ENCOUNTER — Other Ambulatory Visit: Payer: Self-pay

## 2017-04-21 ENCOUNTER — Telehealth: Payer: Self-pay | Admitting: Gastroenterology

## 2017-04-21 MED ORDER — OMEPRAZOLE 40 MG PO CPDR
40.0000 mg | DELAYED_RELEASE_CAPSULE | Freq: Every day | ORAL | 3 refills | Status: DC
Start: 2017-04-21 — End: 2017-06-16

## 2017-04-21 NOTE — Telephone Encounter (Signed)
Are you ok with continuing Omeprazole 40mg  qd? Pt is requesting a refill? Last office visit was 08/2016

## 2017-04-21 NOTE — Telephone Encounter (Signed)
Pt states that she is only taking the Omeprazole. She does not take the [place].  Omeprazole refilled.

## 2017-04-21 NOTE — Telephone Encounter (Signed)
She was previously maintained on Dexilant. If she would prefer Omeprazole, that's fine we can give her 40mg  once daily. If she would prefer Dexilant as this has worked better in the past, that's fine too, just let me know. Thanks

## 2017-04-28 ENCOUNTER — Encounter: Payer: Self-pay | Admitting: Gastroenterology

## 2017-04-28 ENCOUNTER — Other Ambulatory Visit: Payer: Self-pay | Admitting: Pulmonary Disease

## 2017-04-28 ENCOUNTER — Other Ambulatory Visit (INDEPENDENT_AMBULATORY_CARE_PROVIDER_SITE_OTHER): Payer: Medicare Other

## 2017-04-28 ENCOUNTER — Encounter (INDEPENDENT_AMBULATORY_CARE_PROVIDER_SITE_OTHER): Payer: Self-pay

## 2017-04-28 ENCOUNTER — Ambulatory Visit (INDEPENDENT_AMBULATORY_CARE_PROVIDER_SITE_OTHER): Payer: Medicare Other | Admitting: Gastroenterology

## 2017-04-28 VITALS — BP 120/80 | HR 72 | Ht 63.0 in | Wt 95.5 lb

## 2017-04-28 DIAGNOSIS — K219 Gastro-esophageal reflux disease without esophagitis: Secondary | ICD-10-CM

## 2017-04-28 DIAGNOSIS — R109 Unspecified abdominal pain: Secondary | ICD-10-CM | POA: Insufficient documentation

## 2017-04-28 DIAGNOSIS — R5383 Other fatigue: Secondary | ICD-10-CM

## 2017-04-28 LAB — COMPREHENSIVE METABOLIC PANEL
ALBUMIN: 4.1 g/dL (ref 3.5–5.2)
ALK PHOS: 65 U/L (ref 39–117)
ALT: 28 U/L (ref 0–35)
AST: 29 U/L (ref 0–37)
BUN: 11 mg/dL (ref 6–23)
CALCIUM: 9.6 mg/dL (ref 8.4–10.5)
CHLORIDE: 104 meq/L (ref 96–112)
CO2: 30 mEq/L (ref 19–32)
Creatinine, Ser: 0.64 mg/dL (ref 0.40–1.20)
GFR: 96.51 mL/min (ref >60.00)
Glucose, Bld: 81 mg/dL (ref 70–99)
POTASSIUM: 4.4 meq/L (ref 3.5–5.1)
Sodium: 138 mEq/L (ref 135–145)
TOTAL PROTEIN: 6.7 g/dL (ref 6.0–8.3)
Total Bilirubin: 0.3 mg/dL (ref 0.2–1.2)

## 2017-04-28 LAB — CBC WITH DIFFERENTIAL/PLATELET
Basophils Absolute: 0.1 10*3/uL (ref 0.0–0.1)
Basophils Relative: 0.9 % (ref 0.0–3.0)
EOS ABS: 0.1 10*3/uL (ref 0.0–0.7)
Eosinophils Relative: 1.4 % (ref 0.0–5.0)
HEMATOCRIT: 36.4 % (ref 36.0–46.0)
HEMOGLOBIN: 11.6 g/dL — AB (ref 12.0–15.0)
LYMPHS ABS: 1.7 10*3/uL (ref 0.7–4.0)
Lymphocytes Relative: 23.5 % (ref 12.0–46.0)
MCHC: 31.9 g/dL (ref 30.0–36.0)
MCV: 86.4 fl (ref 78.0–100.0)
Monocytes Absolute: 0.6 10*3/uL (ref 0.1–1.0)
Monocytes Relative: 7.4 % (ref 3.0–12.0)
NEUTROS ABS: 5 10*3/uL (ref 1.4–7.7)
Neutrophils Relative %: 66.8 % (ref 43.0–77.0)
Platelets: 392 10*3/uL (ref 150.0–400.0)
RBC: 4.21 Mil/uL (ref 3.87–5.11)
RDW: 15.8 % — AB (ref 11.5–15.5)
WBC: 7.4 10*3/uL (ref 4.0–10.5)

## 2017-04-28 MED ORDER — DEXLANSOPRAZOLE 60 MG PO CPDR: 60.0000 mg | DELAYED_RELEASE_CAPSULE | Freq: Every day | ORAL | 5 refills | Status: DC

## 2017-04-28 NOTE — Patient Instructions (Signed)
Your physician has requested that you go to the basement for lab work before leaving today.  We have sent the following medications to your pharmacy for you to pick up at your convenience: Dexilant 60 mg daily

## 2017-04-28 NOTE — Progress Notes (Signed)
04/28/2017 [name] [id number] 04/10/1941   HISTORY OF PRESENT ILLNESS:  This is a 76 year old female who is known to Dr. Havery Moros. She has history of reflux/dysphagia and iron deficiency anemia for which he has been following her for the past 1.5 years. She's had extensive GI evaluation in regards to these issues.  She is here again today with complaints of reflux, abdominal burning, belching, indigestion, difficulty swallowing, bloating, and fullness.  Says that her abdomen just hurts all over and a lot of times it is burning. She is confused about her PPI as she was told previously that she was going to be prescribed Dexilant 60 mg daily, but then received omeprazole 40 mg twice daily. More recently she received a prescription for esomeprazole 20 mg pills to take two per day and then omeprazole 40 mg to take once daily.  She is unsure what she should be taking and thinks that all of the switch up is making her stomach confused.  Also reports some recent diarrhea a few times per day.  Complains of a lot of fatigue.   Past Medical History:  Diagnosis Date  . Allergy   . Arthritis   . BOOP (bronchiolitis obliterans with organizing pneumonia) ([place])   . Bronchiectasis   . Esophageal dysmotility   . Gastroparesis   . GERD (gastroesophageal reflux disease)    severe  . Iron deficiency anemia   . Neuropathy   . Nutcracker esophagus   . Pneumonia   . Pulmonary fibrosis ([place])   . Raynaud's disease   . Right lower lobe pneumonia December 2013   Past Surgical History:  Procedure Laterality Date  . [place] STUDY N/A 10/13/2015   Procedure: [place] STUDY;  Surgeon: Gatha Mayer, MD;  Location: [facility];  Service: Endoscopy;  Laterality: N/A;  . BREAST LUMPECTOMY     left, x 2  . CATARACT EXTRACTION     both eyes  . CHOLECYSTECTOMY  2005  . COLONOSCOPY    . ESOPHAGEAL MANOMETRY N/A 10/06/2015   Procedure: ESOPHAGEAL MANOMETRY (EM);  Surgeon: Manus Gunning, MD;  Location: [facility];  Service: Gastroenterology;  Laterality: N/A;  . LUNG BIOPSY     vats right 2003  . TOTAL ABDOMINAL HYSTERECTOMY    . UPPER GASTROINTESTINAL ENDOSCOPY      reports that she quit smoking about 48 years ago. Her smoking use included Cigarettes. She has never used smokeless tobacco. She reports that she does not drink alcohol or use drugs. family history includes Cancer in her maternal grandmother; Heart disease in her father and paternal grandfather; Leukemia in her paternal grandmother; Lung cancer in her paternal uncle; Lymphoma in her mother. Allergies  Allergen Reactions  . Cortisone     unsure  . Prednisone   . Sulfonamide Derivatives     Unsure of reaction      Outpatient Encounter Prescriptions as of 04/28/2017  Medication Sig  . Acetaminophen (TYLENOL EXTRA STRENGTH PO) Take 350 mg by mouth 3 (three) times daily as needed.   [name] aspirin EC 81 MG tablet Take 81 mg by mouth daily.  . benzonatate (TESSALON) 200 MG capsule Take 1 capsule (200 mg total) by mouth 3 (three) times daily as needed for cough.  . Calcium-Vitamin D-Vitamin K (VIACTIV PO) Take by mouth.  . fexofenadine (ALLEGRA) 180 MG tablet Take 180 mg by mouth daily.  . folic acid (FOLVITE) 1 MG tablet Take 1 tablet (1 mg total) by  mouth daily.  . montelukast (SINGULAIR) 10 MG tablet Take 1 tablet (10 mg total) by mouth at bedtime.  . Multiple Vitamins-Calcium (VIACTIV MULTI-VITAMIN) CHEW Chew 1 tablet by mouth 4 (four) times daily.    [name] omeprazole (PRILOSEC) 40 MG capsule Take 1 capsule (40 mg total) by mouth daily.  [name] PREMARIN 0.3 MG tablet Take 0.3 mg by mouth daily.   . Thiamine Mononitrate (VITAMIN B1 PO) Take by mouth.  . dexlansoprazole (DEXILANT) 60 MG capsule Take 1 capsule (60 mg total) by mouth daily.  . [DISCONTINUED] cyclobenzaprine (FLEXERIL) 5 MG tablet Take 1 tablet (5 mg total) by mouth 3 (three) times daily as needed for muscle spasms.  . [DISCONTINUED]  dexlansoprazole (DEXILANT) 60 MG capsule Take 1 capsule (60 mg total) by mouth daily.   No facility-administered encounter medications on file as of 04/28/2017.      REVIEW OF SYSTEMS  : All other systems reviewed and negative except where noted in the History of Present Illness.   PHYSICAL EXAM: BP 120/80 (BP Location: Left Arm, Patient Position: Sitting, Cuff Size: Normal)   Pulse 72   Ht 5\' 3"  (1.6 m)   Wt 95 lb 8 oz (43.3 kg)   BMI 16.92 kg/m  General: Well developed white female in no acute distress Head: Normocephalic and atraumatic Eyes:  Sclerae anicteric, conjunctiva pink. Ears: Normal auditory acuity Lungs: Clear throughout to auscultation; no increased WOB. Heart: Regular rate and rhythm Abdomen: Soft, non-distended. Normal bowel sounds.  Non-tender. Musculoskeletal: Symmetrical with no gross deformities  Skin: No lesions on visible extremities Extremities: No edema  Neurological: Alert oriented x 4, grossly non-focal Psychological:  Alert and cooperative. Normal mood and affect  ASSESSMENT AND PLAN: -75 year old female with history of GERD/dysphagia:  Extensive GI evaluation over the years.  Here with recurrent complaints of belching, indigestion, intermittent difficulty swallowing, bloating, early satiety/fullness, and burning abdominal pain. Patient has had some confusion with her PPI therapy. There's been a few different prescriptions that have been sent in for her since her last visit here. It looks like we are going to try to get her Dexilant 60 mg daily as that seemed to work for her in the past, but she never received that and I do not see any notes as to why this did not occur. We will try again for that medication.  Could also consider trial of FDgard in addition. -Fatigue:  Will check CBC, CMP, and Vitamin B1 (was low previously and has been on supplements so she would like it rechecked). -Loose stools:  If they continue then would consider stool studies.   CC:   [name], MD

## 2017-04-29 NOTE — Progress Notes (Signed)
Agree with assessment and plan as outlined. She has GERD and nutcracker esophagus as well, agree with Dexilant, unclear why she had so many prescriptions for PPIs. She also likely has functional dyspepsia. Can try FD gard, will consider other agents for dyspepsia if symptoms persist.

## 2017-05-02 LAB — VITAMIN B1: Vitamin B1 (Thiamine): 26 nmol/L (ref 8–30)

## 2017-05-12 ENCOUNTER — Encounter: Payer: Self-pay | Admitting: Neurology

## 2017-05-12 ENCOUNTER — Ambulatory Visit (INDEPENDENT_AMBULATORY_CARE_PROVIDER_SITE_OTHER): Payer: Medicare Other | Admitting: Neurology

## 2017-05-12 VITALS — BP 108/76 | HR 57 | Ht 64.0 in | Wt 97.0 lb

## 2017-05-12 DIAGNOSIS — M79605 Pain in left leg: Secondary | ICD-10-CM

## 2017-05-12 DIAGNOSIS — M79604 Pain in right leg: Secondary | ICD-10-CM | POA: Diagnosis not present

## 2017-05-12 DIAGNOSIS — G629 Polyneuropathy, unspecified: Secondary | ICD-10-CM | POA: Diagnosis not present

## 2017-05-12 DIAGNOSIS — I8393 Asymptomatic varicose veins of bilateral lower extremities: Secondary | ICD-10-CM

## 2017-05-12 DIAGNOSIS — G44209 Tension-type headache, unspecified, not intractable: Secondary | ICD-10-CM | POA: Diagnosis not present

## 2017-05-12 MED ORDER — GABAPENTIN 100 MG PO CAPS: ORAL_CAPSULE | ORAL | 6 refills | Status: DC

## 2017-05-12 NOTE — Progress Notes (Signed)
NEUROLOGY FOLLOW UP OFFICE NOTE  [name] [id number]  HISTORY OF PRESENT ILLNESS: I had the pleasure of seeing [name] in follow-up in the neurology clinic on 05/12/2017.  The patient was last seen 6 months ago after an episode of headache with dizziness and speech difficulty. She had a few more episodes of headaches with dizziness after that. MRI brain unremarkable. MRA head showed a short segment of moderate stenosis of the left P1 and diminished distal flow-related signal within the left PCA distribution. Her lipid panel showed a total cholesterol of 214, LDL 106. Her ESR and CRP were normal. No further similar episodes since June 2017.   Her main concern on today's visit continues to be about the constant burning and numbness in her legs. She feels it behind her right calf and right thigh, and reports the veins in her left lower leg throb and become painful, with her left ankle becoming swollen. She gets little blisters in both feet. She has been feeling very tired and reports bloodwork has been normal. She has been going to the [facility] where laser therapy was done to her legs, this would only relieve her for 24-48 hours. Since her last visit, she has had some falls. She fell getting out of bed last Fall, she turned but her foot did not move. Another time her right foot did not move. She fell off the bike because she could not get up and move. Last fall was in November but she has tripped a few times since then. She continues to have headaches 3-4 times a week with pain in the right occipital region. Her left ear hurts all the time. She has had some nausea and wears sunglasses all the time. She takes Children's Tylenol or Ibuprofen.   HPI: This is a pleasant 76 yo RH woman with a history of GERD, nutcracker esophagus, pulmonary fibrosis, in her usual state of health until early June 2017 while walking into a grocery she suddenly had left temporal pain, then it felt like she was hit on  both side of the head with a hammer. She reports she was "hit like gangbusters," she was very dizzy with a spinning sensation, nauseated. She held on to the cart for balance and had difficulty concentrating. Her vision was blurred. She managed to finish her chore and walked to the car, then noticed that she was having a hard time talking. She could see the sentence broken up in her head and could not get the words out. She was able to drive home with no focal symptoms noted. The episode lasted 30 minutes or so, she felt weak and tired after. Since then she has had at least 2 or 3 more episodes of headaches with dizziness, which did not progress to speech difficulties. The symptoms would last around 5 minutes, no clear positional component to the dizziness. When she feels it coming on, she massages her head, which seems to help. She was unsure if Tylenol helped. She denies any prior history of headaches. She denies any vision loss, photo/phonophobia, diplopia, dysarthria, jaw claudication, hearing loss. She has occasional pulsatile tinnitus more in the afternoons. She has GERD which makes swallowing difficult. She has a history of neuropathy affecting both feet, R>L, with burning and numbness/tingling for several years. She has been doing laser therapy for venous insufficiency/varicose veins. She denies any falls but trips more. Sleep varies. She is very active and rides her bike for a couple of hours daily. She  works at a preschool. Her mother had a stroke at age 107.   PAST MEDICAL HISTORY: Past Medical History:  Diagnosis Date  . Allergy   . Arthritis   . BOOP (bronchiolitis obliterans with organizing pneumonia) ([place])   . Bronchiectasis   . Esophageal dysmotility   . Gastroparesis   . GERD (gastroesophageal reflux disease)    severe  . Iron deficiency anemia   . Neuropathy   . Nutcracker esophagus   . Pneumonia   . Pulmonary fibrosis ([place])   . Raynaud's disease   . Right lower lobe pneumonia  December 2013    MEDICATIONS:  Outpatient Encounter Prescriptions as of 05/12/2017  Medication Sig  . Acetaminophen (TYLENOL EXTRA STRENGTH PO) Take 350 mg by mouth 3 (three) times daily as needed.   [name] aspirin EC 81 MG tablet Take 81 mg by mouth daily.  . benzonatate (TESSALON) 200 MG capsule Take 1 capsule (200 mg total) by mouth 3 (three) times daily as needed for cough.  . Calcium-Vitamin D-Vitamin K (VIACTIV PO) Take by mouth.  . dexlansoprazole (DEXILANT) 60 MG capsule Take 1 capsule (60 mg total) by mouth daily.  . fexofenadine (ALLEGRA) 180 MG tablet Take 180 mg by mouth daily.  . folic acid (FOLVITE) 1 MG tablet Take 1 tablet (1 mg total) by mouth daily.  . montelukast (SINGULAIR) 10 MG tablet TAKE 1 TABLET AT BEDTIME  . Multiple Vitamins-Calcium (VIACTIV MULTI-VITAMIN) CHEW Chew 1 tablet by mouth 4 (four) times daily.    [name] omeprazole (PRILOSEC) 40 MG capsule Take 1 capsule (40 mg total) by mouth daily.  [name] PREMARIN 0.3 MG tablet Take 0.3 mg by mouth daily.   . Thiamine Mononitrate (VITAMIN B1 PO) Take by mouth.   No facility-administered encounter medications on file as of 05/12/2017.     ALLERGIES: Allergies  Allergen Reactions  . Cortisone     unsure  . Prednisone   . Sulfonamide Derivatives     Unsure of reaction    FAMILY HISTORY: Family History  Problem Relation Age of Onset  . Lymphoma Mother   . Heart disease Father   . Lung cancer Paternal Uncle   . Heart disease Paternal Grandfather   . Leukemia Paternal Grandmother   . Cancer Maternal Grandmother        unknown type  . Colon cancer Neg Hx   . Esophageal cancer Neg Hx   . Rectal cancer Neg Hx   . Stomach cancer Neg Hx     SOCIAL HISTORY: Social History   Social History  . Marital status: Widowed    Spouse name: N/A  . Number of children: 2  . Years of education: N/A   Occupational History  . retired Pharmacist, hospital   .  Retired   Social History Main Topics  . Smoking status: Former Smoker     Types: Cigarettes    Quit date: 11/15/1968  . Smokeless tobacco: Never Used     Comment: 3 cigs a day  . Alcohol use No  . Drug use: No  . Sexual activity: Not on file   Other Topics Concern  . Not on file   Social History Narrative  . No narrative on file    REVIEW OF SYSTEMS: Constitutional: No fevers, chills, or sweats, no generalized fatigue, change in appetite Eyes: No visual changes, double vision, eye pain Ear, nose and throat: No hearing loss, ear pain, nasal congestion, sore throat Cardiovascular: No chest pain, palpitations Respiratory:  No shortness of breath  at rest or with exertion, wheezes GastrointestinaI: No nausea, vomiting, diarrhea, abdominal pain, fecal incontinence Genitourinary:  No dysuria, urinary retention or frequency Musculoskeletal:  + neck pain, back pain Integumentary: No rash, pruritus, skin lesions Neurological: as above Psychiatric: No depression, insomnia, anxiety Endocrine: No palpitations, fatigue, diaphoresis, mood swings, change in appetite, change in weight, increased thirst Hematologic/Lymphatic:  No anemia, purpura, petechiae. Allergic/Immunologic: no itchy/runny eyes, nasal congestion, recent allergic reactions, rashes  PHYSICAL EXAM: Vitals:   05/12/17 1010  BP: 108/76  Pulse: (!) 57   General: No acute distress Head:  Normocephalic/atraumatic Neck: supple, no paraspinal tenderness, full range of motion Heart:  Regular rate and rhythm Lungs:  Clear to auscultation bilaterally Back: No paraspinal tenderness Skin/Extremities: No rash, no edema, +varicose veins, intact dorsalis pedis pulses bilaterally Neurological Exam: alert and oriented to person, place, and time. No aphasia or dysarthria. Fund of knowledge is appropriate.  Recent and remote memory are intact.  Attention and concentration are normal.    Able to name objects and repeat phrases. Cranial nerves: Pupils equal, round, reactive to light. Extraocular movements intact with  no nystagmus. Visual fields full. Facial sensation intact. No facial asymmetry. Tongue, uvula, palate midline.  Motor: Bulk and tone normal, muscle strength 5/5 throughout with no pronator drift.  Sensation intact to all modalities on both UE, decreased vibration and pin to knees bilaterally (similar to prior).  Deep tendon reflexes unable to elicit, toes downgoing.  Finger to nose testing intact.  Gait narrow-based and steady, difficulty with tandem walk.  Romberg positive sway.  IMPRESSION: This is a pleasant 76 yo RH woman with a history of GERD, nutcracker esophagus, pulmonary fibrosis, who had a transient episode of headache, vertigo, and expressive aphasia last June 2017, concerning for TIA. MRI brain no acute changes. Her MRA head and neck was unremarkable except for moderate stenosis of the left P1 and diminished distal flow-related signal within the left PCA distribution. Continue daily aspirin and control of vascular risk factors. No further episodes since June 2017. Her main concern today is the burning pain in her feet. She reports most recent B1 levels were normal. She is now agreeable to starting low dose gabapentin for neuropathic pain as well as tension-type headaches. Side effects were discussed. Start 148m every other night for 2 weeks, then may increase to 1 cap qhs. She has significant varicosities that are painful, which may be contributing to leg pain as well. Referral to Vascular surgery will be sent. She has had a few falls most likely due to neuropathy, and is agreeable to doing PT for Balance therapy. She will follow-up in 4-5 months and knows to call for any changes.   Thank you for allowing me to participate in her care.  Please do not hesitate to call for any questions or concerns.  The duration of this appointment visit was 25 minutes of face-to-face time with the patient.  Greater than 50% of this time was spent in counseling, explanation of diagnosis, planning of further  management, and coordination of care.   KEllouise Newer M.D.   CC: Dr. SFelipa Eth

## 2017-05-12 NOTE — Patient Instructions (Addendum)
1. Start Gabapentin 100mg : Take 1 capsule every other night for 2 weeks, then may increase to 1 capsule every night 2. Refer to Vein and Vascular specialist for leg pain and varicose veins 3. Refer to PT for Balance therapy 4. Follow-up in 4-5 months, call for any changes

## 2017-05-16 ENCOUNTER — Telehealth: Payer: Self-pay | Admitting: Emergency Medicine

## 2017-05-16 NOTE — Telephone Encounter (Signed)
Patient wanted to know if she can have the liquid version of Gabapentin because it is available at her pharmacy.   Also patient would like more information on where she is being referred.

## 2017-05-17 ENCOUNTER — Other Ambulatory Visit: Payer: Self-pay

## 2017-05-17 MED ORDER — GABAPENTIN 300 MG/6ML PO SOLN: 2.0000 mL | Freq: Every day | ORAL | 3 refills | Status: DC

## 2017-05-17 NOTE — Telephone Encounter (Signed)
Called pt. No answer.  LMOM asking pt to return my call for message below -   - Dr. Delice Lesch OK'd liquid Gabapentin, sent to pt's listed preferred pharmacy  - pt referred to vein and vascular therapy for varicose veins.  They will preform a procedure for the pain pt is experiencing with her varicose veins.  Many pt's have had success with these procedures

## 2017-05-17 NOTE — Telephone Encounter (Signed)
LMOM asking pt to return my call for message below -    --- Liquid Gabapentin Rx Sent to pt listed preferred pharmacy.  Sig; 53mL QHS  --- pt referred for vein and vascular therapy for varicose veins.  They will preform a procedure for the pain she is experiencing.  Many pt's have had success with these procedures.

## 2017-05-19 ENCOUNTER — Encounter: Payer: Self-pay | Admitting: Neurology

## 2017-05-19 ENCOUNTER — Telehealth: Payer: Self-pay | Admitting: Neurology

## 2017-05-19 NOTE — Telephone Encounter (Signed)
Spoke with pt.  She asked for phone number to Vein [facility] Vascular - gave that information.  Pt states she will call and schedule her appointment

## 2017-05-24 ENCOUNTER — Other Ambulatory Visit: Payer: Self-pay

## 2017-05-24 DIAGNOSIS — I83813 Varicose veins of bilateral lower extremities with pain: Secondary | ICD-10-CM

## 2017-06-16 ENCOUNTER — Telehealth: Payer: Self-pay

## 2017-06-16 ENCOUNTER — Other Ambulatory Visit: Payer: Self-pay

## 2017-06-16 NOTE — Telephone Encounter (Signed)
Spoke to patient to let her know Dr. Blanch Media advice. She understands to call back next week if she is not better.

## 2017-06-16 NOTE — Telephone Encounter (Signed)
Patient of Dr. Havery Moros, routed to DOD, patient states she is only taking Dexilant 60 mg, one time a day. There had been some confusion about PPI medications and which one she was taking at her last visit. Patient for last 2 weeks describes having breakthrough burning, reflux pain, intermittent nausea/vomiting. She states she has not tried anything else OTC, but today she requested Nexium. Patient does not have any medication for the nausea. Please advise, thank you.

## 2017-06-16 NOTE — Telephone Encounter (Signed)
She can try Nexium. This can be obtained OTC. Have her call back when Dr. Havery Moros returns if she is still having problems

## 2017-06-28 ENCOUNTER — Encounter: Payer: Self-pay | Admitting: Vascular Surgery

## 2017-07-06 ENCOUNTER — Ambulatory Visit (INDEPENDENT_AMBULATORY_CARE_PROVIDER_SITE_OTHER): Payer: Medicare Other | Admitting: Vascular Surgery

## 2017-07-06 ENCOUNTER — Encounter: Payer: Self-pay | Admitting: Vascular Surgery

## 2017-07-06 ENCOUNTER — Ambulatory Visit (HOSPITAL_COMMUNITY)
Admission: RE | Admit: 2017-07-06 | Discharge: 2017-07-06 | Disposition: A | Discharge status: Home / Self Care | Payer: Medicare Other | Source: Ambulatory Visit | Attending: Vascular Surgery | Admitting: Vascular Surgery

## 2017-07-06 VITALS — BP 100/66 | HR 73 | Temp 98.4°F | Resp 16 | Ht 64.0 in | Wt 94.0 lb

## 2017-07-06 DIAGNOSIS — I83893 Varicose veins of bilateral lower extremities with other complications: Secondary | ICD-10-CM | POA: Diagnosis not present

## 2017-07-06 DIAGNOSIS — I872 Venous insufficiency (chronic) (peripheral): Secondary | ICD-10-CM | POA: Diagnosis not present

## 2017-07-06 DIAGNOSIS — I83813 Varicose veins of bilateral lower extremities with pain: Secondary | ICD-10-CM | POA: Diagnosis present

## 2017-07-06 NOTE — Progress Notes (Signed)
Requested by:  [name], MD [address] [place], [place] [address]  Reason for consultation: worsening left varicose veins    History of Present Illness   [name] is a 76 y.o. ([birth year]) female who presents with chief complaint: enlarging left varicose veins.  Patient notes, onset of swelling years ago, associated with no obvious trigger.  The patient's symptoms include: L>R leg swelling with extended standing, aching associated with such.  The patient has had no history of DVT, known history of pregnancy, known history of varicose vein, no history of venous stasis ulcers, no history of  Lymphedema and no history of skin changes in lower legs.  There is no family history of venous disorders.  The patient has used compression stockings in the past.  Pt notes some type of "laser procedure" on her legs for neuropathy.  Past Medical History:  Diagnosis Date  . Allergy   . Arthritis   . BOOP (bronchiolitis obliterans with organizing pneumonia) ([place])   . Bronchiectasis   . Esophageal dysmotility   . Gastroparesis   . GERD (gastroesophageal reflux disease)    severe  . Iron deficiency anemia   . Neuropathy   . Nutcracker esophagus   . Pneumonia   . Pulmonary fibrosis ([place])   . Raynaud's disease   . Right lower lobe pneumonia December 2013    Past Surgical History:  Procedure Laterality Date  . [place] STUDY N/A 10/13/2015   Procedure: [place] STUDY;  Surgeon: Gatha Mayer, MD;  Location: [facility];  Service: Endoscopy;  Laterality: N/A;  . BREAST LUMPECTOMY     left, x 2  . CATARACT EXTRACTION     both eyes  . CHOLECYSTECTOMY  2005  . COLONOSCOPY    . ESOPHAGEAL MANOMETRY N/A 10/06/2015   Procedure: ESOPHAGEAL MANOMETRY (EM);  Surgeon: Manus Gunning, MD;  Location: [facility];  Service: Gastroenterology;  Laterality: N/A;  . LUNG BIOPSY     vats right 2003  . TOTAL ABDOMINAL HYSTERECTOMY    . UPPER GASTROINTESTINAL ENDOSCOPY       Social History   Social History  . Marital status: Widowed    Spouse name: N/A  . Number of children: 2  . Years of education: N/A   Occupational History  . retired Pharmacist, hospital   .  Retired   Social History Main Topics  . Smoking status: Former Smoker    Types: Cigarettes    Quit date: 11/15/1968  . Smokeless tobacco: Never Used     Comment: 3 cigs a day  . Alcohol use No  . Drug use: No  . Sexual activity: Not on file   Other Topics Concern  . Not on file   Social History Narrative  . No narrative on file    Family History  Problem Relation Age of Onset  . Lymphoma Mother   . Heart disease Father   . Lung cancer Paternal Uncle   . Heart disease Paternal Grandfather   . Leukemia Paternal Grandmother   . Cancer Maternal Grandmother        unknown type  . Colon cancer Neg Hx   . Esophageal cancer Neg Hx   . Rectal cancer Neg Hx   . Stomach cancer Neg Hx     Current Outpatient Prescriptions  Medication Sig Dispense Refill  . Acetaminophen (TYLENOL EXTRA STRENGTH PO) Take 350 mg by mouth 3 (three) times daily as needed.     [name]  aspirin EC 81 MG tablet Take 81 mg by mouth daily.    . benzonatate (TESSALON) 200 MG capsule Take 1 capsule (200 mg total) by mouth 3 (three) times daily as needed for cough. 60 capsule 1  . Calcium-Vitamin D-Vitamin K (VIACTIV PO) Take by mouth.    . dexlansoprazole (DEXILANT) 60 MG capsule Take 1 capsule (60 mg total) by mouth daily. 30 capsule 5  . fexofenadine (ALLEGRA) 180 MG tablet Take 180 mg by mouth daily.    . folic acid (FOLVITE) 1 MG tablet Take 1 tablet (1 mg total) by mouth daily. 90 tablet 3  . Gabapentin 300 MG/6ML SOLN Take 2 mLs by mouth at bedtime. 180 mL 3  . montelukast (SINGULAIR) 10 MG tablet TAKE 1 TABLET AT BEDTIME 90 tablet 1  . Multiple Vitamins-Calcium (VIACTIV MULTI-VITAMIN) CHEW Chew 1 tablet by mouth 4 (four) times daily.      [name] PREMARIN 0.3 MG tablet Take 0.3 mg by mouth daily.     . Thiamine Mononitrate  (VITAMIN B1 PO) Take by mouth.     No current facility-administered medications for this visit.     Allergies  Allergen Reactions  . Cortisone     unsure  . Prednisone   . Sulfonamide Derivatives     Unsure of reaction    REVIEW OF SYSTEMS (negative unless checked):   Cardiac:  []  Chest pain or chest pressure? [x]  Shortness of breath upon activity? []  Shortness of breath when lying flat? []  Irregular heart rhythm?  Vascular:  [x]  Pain in calf, thigh, or hip brought on by walking? [x]  Pain in feet at night that wakes you up from your sleep? []  Blood clot in your veins? [x]  Leg swelling?  Pulmonary:  []  Oxygen at home? []  Productive cough? [x]  Wheezing?  Neurologic:  []  Sudden weakness in arms or legs? []  Sudden numbness in arms or legs? []  Sudden onset of difficult speaking or slurred speech? []  Temporary loss of vision in one eye? []  Problems with dizziness?  Gastrointestinal:  []  Blood in stool? []  Vomited blood?  Genitourinary:  []  Burning when urinating? []  Blood in urine?  Psychiatric:  []  Major depression  Hematologic:  []  Bleeding problems? []  Problems with blood clotting?  Dermatologic:  []  Rashes or ulcers?  Constitutional:  []  Fever or chills?  Ear/Nose/Throat:  []  Change in hearing? []  Nose bleeds? []  Sore throat?  Musculoskeletal:  []  Back pain? []  Joint pain? []  Muscle pain?   Physical Examination     Vitals:   07/06/17 1154  BP: 100/66  Pulse: 73  Resp: 16  Temp: 98.4 F (36.9 C)  SpO2: 98%  Weight: 94 lb (42.6 kg)  Height: 5\' 4"  (1.626 m)   Body mass index is 16.14 kg/m.  General Alert, O x 3, Cachectic, Ill appearing  Head [place]/AT,    Ear/Nose/ Throat Hearing grossly intact, nares without erythema or drainage, oropharynx without Erythema or Exudate, Mallampati score: 3,   Eyes PERRLA, EOMI,    Neck Supple, mid-line trachea,    Pulmonary Sym exp, good B air movt, faint rales B  Cardiac RRR, Nl S1, S2, no  Murmurs, No rubs, No S3,S4  Vascular Vessel Right Left  Radial Palpable Palpable  Brachial Palpable Palpable  Carotid Palpable, No Bruit Palpable, No Bruit  Aorta Not palpable N/A  Femoral Palpable Palpable  Popliteal Not palpable Not palpable  PT Faintly palpable Faintly palpable  DP Faintly palpable Faintly palpable    Gastro- intestinal soft, non-distended,  non-tender to palpation, No guarding or rebound, no HSM, no masses, no CVAT B, No palpable prominent aortic pulse,    Musculo- skeletal M/S 5/5 throughout  , Extremities without ischemic changes  , No edema present, Varicosities present: L>R (small to moderate), No Lipodermatosclerosis present, extensive spider veins in both legs  Neurologic Cranial nerves 2-12 intact , Pain and light touch intact in extremities , Motor exam as listed above  Psychiatric Judgement intact, Mood & affect appropriate for pt's clinical situation  Dermatologic See M/S exam for extremity exam, No rashes otherwise noted  Lymphatic  Palpable lymph nodes: None    Non-invasive Vascular Imaging   BLE Venous Insufficiency Duplex (07/06/2017 ):   RLE:   no DVT and SVT,   no GSV reflux,   no SSV reflux,  + deep venous reflux: CFV  LLE:  no DVT and SVT,   no GSV reflux,   no SSV reflux,  + deep venous reflux: CFV, FV   Outside Studies/Documentation   4 pages of outside documents were reviewed including: outpatient PCP chart.   Medical Decision Making   DAIJANAE RAFALSKI is a 76 y.o. female who presents with: BLE chronic venous insufficiency (C2), varicose veins with complications   While her sx are consistent with CVI, her reflux duplex demonstrates relatively limited valvular insufficiency.    I suspect her disease process might be early, so if her sx worsen, would consider repeating her BLE reflux duplex.  Based on the patient's history and examination, I recommend: OTC compression stocking to both legs.  Thank you for allowing [place] to  participate in this patient's care.   Adele Barthel, MD, FACS [facility] Office: [phone] Pager: [phone]  07/06/2017, 3:09 PM

## 2017-07-11 ENCOUNTER — Ambulatory Visit: Payer: Medicare Other | Attending: Geriatric Medicine | Admitting: Rehabilitative and Restorative Service Providers"

## 2017-07-11 DIAGNOSIS — R2689 Other abnormalities of gait and mobility: Secondary | ICD-10-CM | POA: Diagnosis present

## 2017-07-11 DIAGNOSIS — M6281 Muscle weakness (generalized): Secondary | ICD-10-CM | POA: Diagnosis present

## 2017-07-11 NOTE — Patient Instructions (Signed)
Heel Cord Stretch    Place one leg forward, bent, other leg behind and straight. Lean forward keeping back heel flat. Hold __30__ seconds while counting out loud. Repeat with other leg. Repeat __3__ times. Do __2__ sessions per day.  http://gt2.exer.us/512   Copyright  VHI. All rights reserved.   SINGLE LIMB STANCE    Stance: single leg on floor. Raise leg. Hold _10-15__ seconds. Repeat with other leg. _3__ reps per set, 2 times per day.  Copyright  VHI. All rights reserved.   Piriformis Stretch, Sitting    Sit, one ankle on opposite knee, same-side hand on crossed knee. Push down on knee, keeping spine straight. Lean torso forward, with flat back, until tension is felt in hamstrings and gluteals of crossed-leg side. Hold _30__ seconds.  Repeat _3__ times on each side per session. Do _2__ sessions per day.  Copyright  VHI. All rights reserved.

## 2017-07-12 NOTE — Telephone Encounter (Signed)
error 

## 2017-07-12 NOTE — Therapy (Signed)
[facility] [address] [place], [place], [address] Phone: [phone]   Fax:  [phone]  Physical Therapy Evaluation  Patient Details  Name: [name] MRN: [id number] Date of Birth: 1941-04-02 Referring Provider: Ellouise Newer, MD  Encounter Date: 07/11/2017      PT End of Session - 07/11/17 2109    Visit Number 1   Number of Visits 12   Date for PT Re-Evaluation 09/09/17   Authorization Type G code every 10th visit   PT Start Time 1405   PT Stop Time 1450   PT Time Calculation (min) 45 min   Activity Tolerance Patient tolerated treatment well   Behavior During Therapy [facility] for tasks assessed/performed      Past Medical History:  Diagnosis Date  . Allergy   . Arthritis   . BOOP (bronchiolitis obliterans with organizing pneumonia) ([place])   . Bronchiectasis   . Esophageal dysmotility   . Gastroparesis   . GERD (gastroesophageal reflux disease)    severe  . Iron deficiency anemia   . Neuropathy   . Nutcracker esophagus   . Pneumonia   . Pulmonary fibrosis ([place])   . Raynaud's disease   . Right lower lobe pneumonia December 2013    Past Surgical History:  Procedure Laterality Date  . [place] STUDY N/A 10/13/2015   Procedure: [place] STUDY;  Surgeon: Gatha Mayer, MD;  Location: [facility];  Service: Endoscopy;  Laterality: N/A;  . BREAST LUMPECTOMY     left, x 2  . CATARACT EXTRACTION     both eyes  . CHOLECYSTECTOMY  2005  . COLONOSCOPY    . ESOPHAGEAL MANOMETRY N/A 10/06/2015   Procedure: ESOPHAGEAL MANOMETRY (EM);  Surgeon: Manus Gunning, MD;  Location: [facility];  Service: Gastroenterology;  Laterality: N/A;  . LUNG BIOPSY     vats right 2003  . TOTAL ABDOMINAL HYSTERECTOMY    . UPPER GASTROINTESTINAL ENDOSCOPY      There were no vitals filed for this visit.       Subjective Assessment - 07/11/17 1407    Subjective The patient notes difficulties with neuropathy  worse on the right leg with pain, weakness, and imbalance.  She notes that she exercises regularly performing walking, and rides a bike for 2 hours daily.  She notes several falls in which she "couldn't get the (right) leg to go".  She estimates 4 falls in the past 6 months.   She describes numbness in both feet from mid foot distal.     Patient Stated Goals Improve balance.   Currently in Pain? Yes   Pain Score 7   constant   Pain Location Leg  and feet   Pain Orientation Right;Left   Pain Descriptors / Indicators Throbbing  feels swollen   Pain Type Chronic pain   Pain Radiating Towards from feet into legs   Pain Onset More than a month ago   Pain Frequency Constant   Aggravating Factors  worse when on her feet all day   Pain Relieving Factors tylenol arthritis            [facility] PT Assessment - 07/11/17 1418      Assessment   Medical Diagnosis neuropathy, pain in LEs,  imbalance   Referring Provider Ellouise Newer, MD   Prior Therapy none prior     Precautions   Precautions Fall   Precaution Comments falls have occurred indoors     Restrictions  Weight Bearing Restrictions No     Balance Screen   Has the patient fallen in the past 6 months Yes   How many times? 4   Has the patient had a decrease in activity level because of a fear of falling?  No   Is the patient reluctant to leave their home because of a fear of falling?  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   Leisure exercises regularly     Cognition   Overall Cognitive Status Within Functional Limits for tasks assessed      Observation/Other Assessments   Focus on Therapeutic Outcomes (FOTO)  45%   Other Surveys  Other Surveys   Activities of Balance Confidence Scale (ABC Scale)  23.6%     Sensation   Light Touch --  numbness in both feet   Additional Comments pain radiating from feet up legs     Posture/Postural Control   Posture/Postural Control No significant limitations     ROM / Strength   AROM / PROM / Strength AROM;Strength     AROM   Overall AROM  Deficits   Overall AROM Comments Noted tightness in heel cords with -10 degrees dorsiflexion (unable to obtain neutral ankle position in seated, however demonstrates in standing with weight bearing)     Strength   Overall Strength Deficits   Overall Strength Comments Bilateral shoulder flexion 4/5, bilateral shoulder abduction 4/5.  Bilateral hip flexion 3+/5, bilateral knee flexion 4/5, bilateral knee extension 4/5, bilateral ankle DF 3/5.  Bilateral hip abduction 3/5, right hip extension 2+/5, L hip extension 3/5.       Flexibility   Soft Tissue Assessment /Muscle Length yes  tightness noted bilateral heel cord     Transfers   Transfers Sit to Stand   Sit to Stand 7: Independent;Without upper extremity assist     Ambulation/Gait   Ambulation/Gait Yes   Ambulation/Gait Assistance 6: Modified independent (Device/Increase time)   Ambulation Distance (Feet) 200 Feet   Assistive device None   Gait Pattern Decreased stride length  slowed pace, decreased heel strike   Ambulation Surface Level;Indoor   Gait velocity 2.66 ft/sec   Stairs Yes   Stairs Assistance 6: Modified independent (Device/Increase time)   Stair Management Technique One rail Left;Alternating pattern   Number of Stairs 4     Standardized Balance Assessment   Standardized Balance Assessment Berg Balance Test     Berg Balance Test   Sit to Stand Able to stand without using hands and stabilize independently   Standing Unsupported Able to stand safely 2 minutes    Sitting with Back Unsupported but Feet Supported on Floor or Stool Able to sit safely and securely 2 minutes   Stand to Sit Sits safely with minimal use of hands   Transfers Able to transfer safely, minor use of hands   Standing Unsupported with Eyes Closed Able to stand 10 seconds safely   Standing Ubsupported with Feet Together Able to place feet together independently and stand 1 minute  safely   From Standing, Reach Forward with Outstretched Arm Can reach forward >12 cm safely (5")   From Standing Position, Pick up Object from [place] to pick up shoe safely and easily   From Standing Position, Turn to Look Behind Over each Shoulder Looks behind from both sides and weight shifts well   Turn 360 Degrees Able to turn 360 degrees safely but slowly   Standing Unsupported, Alternately Place Feet on Step/Stool Able to stand independently and safely and complete 8 steps in 20 seconds   Standing Unsupported, One Foot in Front Able to plae foot ahead of the other independently and hold 30 seconds   Standing on One Leg Able to lift leg independently and hold equal to or more than 3 seconds   Total Score 50   Berg comment: 50/56     High Level Balance   High Level Balance Comments Single limb stance x 4 seconds bilaterally.            Objective measurements completed on examination: See above findings.          [place] Adult PT Treatment/Exercise - 07/11/17 1418      Neuro Re-ed    Neuro Re-ed Details  single limb stance     Exercises   Exercises Other Exercises   Other Exercises  heel cord stretch, seated piriformis stretch.                PT Education - 07/11/17 1454    Education provided Yes   Education Details HEP: piriformis stretch, heel cord stretch, single leg stance   Person(s) Educated Patient   Methods Explanation;Demonstration;Handout   Comprehension Verbalized understanding;Returned demonstration          PT Short Term Goals - 07/11/17 2146      PT  SHORT TERM GOAL #1   Title The patient will be indep with HEP for LE strengthening, balance, and general mobility.   Time 4   Period Weeks   Target Date 08/10/17     PT SHORT TERM GOAL #2   Title The patient will maintain single limb stance x 10 seconds bilaterally to demo improving balance.   Time 4   Period Weeks   Target Date 08/10/17     PT SHORT TERM GOAL #3   Title The patient will improve gait speed to > or equal to 3.0 ft/sec to demo improving functional mobility.   Baseline 2.66 ft/sec   Time 4   Period Weeks   Target Date 08/10/17           PT Long Term Goals - 07/11/17 2147      PT LONG TERM GOAL #1   Title The patient will verbalize post d/c program for community activities.   Time 8   Period Weeks   Target Date 09/09/17     PT LONG TERM GOAL #2   Title The patient will improve Berg to > or equal to 54/56.   Baseline 50/56   Time 8   Period Weeks   Target Date 09/09/17     PT LONG TERM GOAL #3   Title The patient will negotiate 4 steps without handrails with reciprocal pattern.   Time 8   Period Weeks   Target Date 09/09/17                Plan - 07/11/17 2148    Clinical Impression Statement The patient is a 76 yo female with worsening neuropathy, weakness and  falls over the past 6 months.  She presents to PT with LE weakness, decreased ankle ROM, decreased high level balance and slowed gait speed.  PT to address deficits to optimize functional mobility and make recommendations for community activities to maintain fitness.   History and Personal Factors relevant to plan of care: 4 falls in past 6 months   Clinical Presentation Stable   Clinical Decision Making Low   Rehab Potential Good   Clinical Impairments Affecting Rehab Potential Patient leads active lifestyle   PT Frequency 2x / week   PT Duration 4 weeks  followed by 1x/week for 4 weeks   PT Treatment/Interventions ADLs/Self Care Home Management;Therapeutic activities;Therapeutic  exercise;Neuromuscular re-education;Balance training;Gait training;Stair training;Functional mobility training;Patient/family education   PT Next Visit Plan Check HEP; standing ankle strategy, ankle strengthening (heel/toe walking), add strengthening to gym routine; high level balance   Consulted and Agree with Plan of Care Patient      Patient will benefit from skilled therapeutic intervention in order to improve the following deficits and impairments:  Abnormal gait, Decreased balance, Decreased strength, Decreased range of motion, Impaired flexibility  Visit Diagnosis: Other abnormalities of gait and mobility  Muscle weakness (generalized)      G-Codes - July 14, 2017 0952    Functional Assessment Tool Used (Outpatient Only) ABC scale=23.6%, functional status survey=45%   Functional Limitation Mobility: Walking and moving around   Mobility: Walking and Moving Around Current Status ([id number]) At least 40 percent but less than 60 percent impaired, limited or restricted   Mobility: Walking and Moving Around Goal Status [phone]) At least 20 percent but less than 40 percent impaired, limited or restricted       Problem List Patient Active Problem List   Diagnosis Date Noted  . Abdominal pain 04/28/2017  . Other fatigue 04/28/2017  . Pneumonia 12/14/2016  . Neuropathy 07/30/2016  . TIA (transient ischemic attack) 06/22/2016  . New onset of headaches after age 11 06/22/2016  . Dysphagia   . Allergic reaction 01/11/2015  . Arthritis of right ankle 10/09/2013  . Pes cavus 10/09/2013  . Loss of transverse plantar arch 10/09/2013  . Allergic rhinitis 05/14/2013  . BRONCHIECTASIS 12/15/2010  . ESOPHAGEAL MOTILITY DISORDER 01/26/2008  . GERD 01/26/2008  . GASTROPARESIS 01/26/2008    Myda Detwiler, PT 2017/07/14, 9:54 AM  [facility] [address] [place], [place], [address] Phone: [phone]   Fax:  [phone]  Name:  [name] MRN: [id number] Date of Birth: 21-Dec-1940

## 2017-07-13 ENCOUNTER — Other Ambulatory Visit: Payer: Self-pay | Admitting: Gastroenterology

## 2017-07-14 MED ORDER — DEXLANSOPRAZOLE 60 MG PO CPDR: 60.0000 mg | DELAYED_RELEASE_CAPSULE | Freq: Every day | ORAL | 3 refills | Status: DC

## 2017-07-14 NOTE — Telephone Encounter (Signed)
Prescription has been changed to 90 day supply to [facility]

## 2017-08-04 ENCOUNTER — Encounter: Payer: Self-pay | Admitting: Rehabilitative and Restorative Service Providers"

## 2017-08-05 ENCOUNTER — Ambulatory Visit: Payer: Medicare Other | Attending: Geriatric Medicine | Admitting: Rehabilitative and Restorative Service Providers"

## 2017-08-05 DIAGNOSIS — M6281 Muscle weakness (generalized): Secondary | ICD-10-CM | POA: Diagnosis present

## 2017-08-05 DIAGNOSIS — R2689 Other abnormalities of gait and mobility: Secondary | ICD-10-CM | POA: Diagnosis present

## 2017-08-05 NOTE — Patient Instructions (Signed)
KNEE: Flexion - Prone    Bend knee. Raise heel toward buttocks. Do not raise hips. _5__ reps per set, _1__ sets per day.   Copyright  VHI. All rights reserved.    Hip Extension    Lying face down, raise leg just off floor. Keep leg straight. Hold 3 count. Lower leg to floor. Repeat __5__ times each leg.  Copyright  VHI. All rights reserved.   Abduction: Side Leg Lift (Eccentric) - Side-Lying   Lie on side. Lift top leg slightly higher than shoulder level. Keep top leg straight with body, toes pointing forward. Slowly lower for 3-5 seconds. _10__ reps per set, _1__ sets per day. Copyright  VHI. All rights reserved.   Strengthening: Straight Leg Raise (Phase 1)   Tighten muscles on front of right thigh, then lift leg _6-8___ inches from surface, keeping knee locked.  Repeat _5___ times per set. Do __1__ sets per session. Do __1__ sessions per day.  http://orth.exer.us/614   Copyright  VHI. All rights reserved.   Heel Cord Stretch    Place one leg forward, bent, other leg behind and straight. Lean forward keeping back heel flat. Hold __30__ seconds while counting out loud. Repeat with other leg. Repeat __3__ times. Do __2__ sessions per day.  http://gt2.exer.us/512   Copyright  VHI. All rights reserved.   SINGLE LIMB STANCE    Stance: single leg on floor. Raise leg. Hold _10-15__ seconds. Repeat with other leg. _3__ reps per set, 2 times per day.  Copyright  VHI. All rights reserved.   Piriformis Stretch, Sitting    Sit, one ankle on opposite knee, same-side hand on crossed knee. Push down on knee, keeping spine straight. Lean torso forward, with flat back, until tension is felt in hamstrings and gluteals of crossed-leg side. Hold _30__ seconds.  Repeat _3__ times on each side per session. Do _2__ sessions per day.

## 2017-08-05 NOTE — Therapy (Signed)
[facility] [address] [place], [place], [address] Phone: [phone]   Fax:  [phone]  Physical Therapy Treatment  Patient Details  Name: [name] MRN: [id number] Date of Birth: 10/02/1941 Referring Provider: Ellouise Newer, MD  Encounter Date: 08/05/2017      PT End of Session - 08/05/17 1459    Visit Number 2   Number of Visits 12   Date for PT Re-Evaluation 09/09/17   Authorization Type G code every 10th visit   PT Start Time 1450   PT Stop Time 1530   PT Time Calculation (min) 40 min   Activity Tolerance Patient tolerated treatment well   Behavior During Therapy [facility] for tasks assessed/performed      Past Medical History:  Diagnosis Date  . Allergy   . Arthritis   . BOOP (bronchiolitis obliterans with organizing pneumonia) ([place])   . Bronchiectasis   . Esophageal dysmotility   . Gastroparesis   . GERD (gastroesophageal reflux disease)    severe  . Iron deficiency anemia   . Neuropathy   . Nutcracker esophagus   . Pneumonia   . Pulmonary fibrosis ([place])   . Raynaud's disease   . Right lower lobe pneumonia December 2013    Past Surgical History:  Procedure Laterality Date  . [place] STUDY N/A 10/13/2015   Procedure: [place] STUDY;  Surgeon: Gatha Mayer, MD;  Location: [facility];  Service: Endoscopy;  Laterality: N/A;  . BREAST LUMPECTOMY     left, x 2  . CATARACT EXTRACTION     both eyes  . CHOLECYSTECTOMY  2005  . COLONOSCOPY    . ESOPHAGEAL MANOMETRY N/A 10/06/2015   Procedure: ESOPHAGEAL MANOMETRY (EM);  Surgeon: Manus Gunning, MD;  Location: [facility];  Service: Gastroenterology;  Laterality: N/A;  . LUNG BIOPSY     vats right 2003  . TOTAL ABDOMINAL HYSTERECTOMY    . UPPER GASTROINTESTINAL ENDOSCOPY      There were no vitals filed for this visit.      Subjective Assessment - 08/05/17 1457    Subjective The patient notes she doesn't sit often enough to  do piriformis stretch.     Patient Stated Goals Improve balance.   Currently in Pain? Yes   Pain Score --  burning sensation and cramps   Pain Location Leg   Pain Orientation Right;Left   Pain Descriptors / Indicators Burning;Cramping   Pain Type Chronic pain   Pain Onset More than a month ago   Pain Frequency Constant   Aggravating Factors  worse when on feet all day   Pain Relieving Factors tylenol arthritis                         [facility] Adult PT Treatment/Exercise - 08/05/17 1513      Ambulation/Gait   Ambulation/Gait Yes   Ambulation/Gait Assistance 6: Modified independent (Device/Increase time)   Ambulation Distance (Feet) 350 Feet   Assistive device None   Gait Comments Heel walking (with some discomfort noted R heel), toe walking and marching for dynamic balance challenges.       Neuro Re-ed    Neuro Re-ed Details  Single limb stance decreasing upper extremity support     Exercises   Exercises Other Exercises   Other Exercises  SITTING:  reviewed piriformis stretching x right and left x 2 reps, SIDELYING:  hip abduction x 15 reps using a pillow to  lessen ROM, PRONE:  hip extension x 5 reps each side, knee flexion x 5 reps each side.  SUPINE:  SLR x 10 reps right and left. STANDING:  heel cord stretch x 3 reps, standing heel raises x 10 reps right and left sides.                 PT Education - 08/05/17 1548    Education provided Yes   Education Details HEP: added hip abduction, hip flexion, hip extension, knee flexion for HEP   Person(s) Educated Patient   Methods Explanation;Demonstration;Handout   Comprehension Verbalized understanding;Returned demonstration          PT Short Term Goals - 07/11/17 2146      PT SHORT TERM GOAL #1   Title The patient will be indep with HEP for LE strengthening, balance, and general mobility.   Time 4   Period Weeks   Target Date 08/10/17     PT SHORT TERM GOAL #2   Title The patient will maintain  single limb stance x 10 seconds bilaterally to demo improving balance.   Time 4   Period Weeks   Target Date 08/10/17     PT SHORT TERM GOAL #3   Title The patient will improve gait speed to > or equal to 3.0 ft/sec to demo improving functional mobility.   Baseline 2.66 ft/sec   Time 4   Period Weeks   Target Date 08/10/17           PT Long Term Goals - 07/11/17 2147      PT LONG TERM GOAL #1   Title The patient will verbalize post d/c program for community activities.   Time 8   Period Weeks   Target Date 09/09/17     PT LONG TERM GOAL #2   Title The patient will improve Berg to > or equal to 54/56.   Baseline 50/56   Time 8   Period Weeks   Target Date 09/09/17     PT LONG TERM GOAL #3   Title The patient will negotiate 4 steps without handrails with reciprocal pattern.   Time 8   Period Weeks   Target Date 09/09/17               Plan - 08/05/17 1553    Clinical Impression Statement The patient's HEP further developed with emphasis on hip strengthening and stetching t/o LEs.  PT and patient discussed frequency and patient prefers to continue at 1x/week at this time.  She notes she wants to continue 2 hours/day riding bike and will add HEP in addition to biking.  PT encouraged biking 1.5 hours and adding stretching/strengthening for more well rounded plan.    PT Treatment/Interventions ADLs/Self Care Home Management;Therapeutic activities;Therapeutic exercise;Neuromuscular re-education;Balance training;Gait training;Stair training;Functional mobility training;Patient/family education   PT Next Visit Plan check HEP; LE flexibility, LE strengthening, tenns ball roll feet, high level balance/ adding multi-sensory challenges   Consulted and Agree with Plan of Care Patient      Patient will benefit from skilled therapeutic intervention in order to improve the following deficits and impairments:  Abnormal gait, Decreased balance, Decreased strength, Decreased range  of motion, Impaired flexibility  Visit Diagnosis: Other abnormalities of gait and mobility  Muscle weakness (generalized)     Problem List Patient Active Problem List   Diagnosis Date Noted  . Abdominal pain 04/28/2017  . Other fatigue 04/28/2017  . Pneumonia 12/14/2016  . Neuropathy 07/30/2016  . TIA (transient  ischemic attack) 06/22/2016  . New onset of headaches after age 51 06/22/2016  . Dysphagia   . Allergic reaction 01/11/2015  . Arthritis of right ankle 10/09/2013  . Pes cavus 10/09/2013  . Loss of transverse plantar arch 10/09/2013  . Allergic rhinitis 05/14/2013  . BRONCHIECTASIS 12/15/2010  . ESOPHAGEAL MOTILITY DISORDER 01/26/2008  . GERD 01/26/2008  . GASTROPARESIS 01/26/2008    Bob Daversa , PT 08/05/2017, 3:55 PM  [facility] [address] [place], [place], [address] Phone: [phone]   Fax:  [phone]  Name: [name] MRN: [id number] Date of Birth: [birth year]

## 2017-08-08 ENCOUNTER — Ambulatory Visit: Payer: Medicare Other | Admitting: Rehabilitative and Restorative Service Providers"

## 2017-08-08 DIAGNOSIS — R2689 Other abnormalities of gait and mobility: Secondary | ICD-10-CM | POA: Diagnosis not present

## 2017-08-08 DIAGNOSIS — M6281 Muscle weakness (generalized): Secondary | ICD-10-CM

## 2017-08-08 NOTE — Patient Instructions (Signed)
PROM: Toe Flexion / Extension    Gently grasp right toes and curl then straighten them. Hold each position __20__ seconds. Repeat _5___ times per set. Do __1__ sets per session. Do __1-2__ sessions per day. Have someone else move foot.  http://orth.exer.us/67   Copyright  VHI. All rights reserved.   TENNIS BALL STRETCH:  Sitting, roll foot over tennis ball within tolerable range and pressure x 2 minutes.  Then place tennis ball under ball of foot and put heel down.  Flex toes and then extend them around tennis ball.   Gaze Stabilization - Tip Card  1.Target must remain in focus, not blurry, and appear stationary while head is in motion. 2.Perform exercises with small head movements (45 to either side of midline). 3.Increase speed of head motion so long as target is in focus. 4.If you wear eyeglasses, be sure you can see target through lens (therapist will give specific instructions for bifocal / progressive lenses). 5.These exercises may provoke dizziness or nausea. Work through these symptoms. If too dizzy, slow head movement slightly. Rest between each exercise. 6.Exercises demand concentration; avoid distractions. 7.For safety, perform standing exercises close to a counter, wall, corner, or next to someone.  Copyright  VHI. All rights reserved.   Gaze Stabilization - Standing Feet Apart   Feet shoulder width apart, keeping eyes on target on wall 3 feet away, tilt head down slightly and move head side to side for 30 seconds.  *Work up to tolerating 60 seconds, as able. Do 2-3 sessions per day.   Copyright  VHI. All rights reserved.   Feet Together, Varied Arm Positions - Eyes Closed    Stand with feet together and arms out. Close eyes and visualize upright position. Hold _30___ seconds. Repeat __3__ times per session. Do ___1_ sessions per day.  Copyright  VHI. All rights reserved.

## 2017-08-08 NOTE — Therapy (Signed)
[facility] [address] [place], [place], [address] Phone: [phone]   Fax:  [phone]  Physical Therapy Treatment  Patient Details  Name: [name] MRN: [id number] Date of Birth: [birth year] Referring Provider: Ellouise Newer, MD  Encounter Date: 08/08/2017      PT End of Session - 08/08/17 1214    Visit Number 3   Number of Visits 12   Date for PT Re-Evaluation 09/09/17   Authorization Type G code every 10th visit   PT Start Time 1022   PT Stop Time 1102   PT Time Calculation (min) 40 min   Activity Tolerance Patient tolerated treatment well   Behavior During Therapy [facility] for tasks assessed/performed      Past Medical History:  Diagnosis Date  . Allergy   . Arthritis   . BOOP (bronchiolitis obliterans with organizing pneumonia) ([place])   . Bronchiectasis   . Esophageal dysmotility   . Gastroparesis   . GERD (gastroesophageal reflux disease)    severe  . Iron deficiency anemia   . Neuropathy   . Nutcracker esophagus   . Pneumonia   . Pulmonary fibrosis ([place])   . Raynaud's disease   . Right lower lobe pneumonia December 2013    Past Surgical History:  Procedure Laterality Date  . [place] STUDY N/A 10/13/2015   Procedure: [place] STUDY;  Surgeon: Gatha Mayer, MD;  Location: [facility];  Service: Endoscopy;  Laterality: N/A;  . BREAST LUMPECTOMY     left, x 2  . CATARACT EXTRACTION     both eyes  . CHOLECYSTECTOMY  2005  . COLONOSCOPY    . ESOPHAGEAL MANOMETRY N/A 10/06/2015   Procedure: ESOPHAGEAL MANOMETRY (EM);  Surgeon: Manus Gunning, MD;  Location: [facility];  Service: Gastroenterology;  Laterality: N/A;  . LUNG BIOPSY     vats right 2003  . TOTAL ABDOMINAL HYSTERECTOMY    . UPPER GASTROINTESTINAL ENDOSCOPY      There were no vitals filed for this visit.      Subjective Assessment - 08/08/17 1209    Subjective The patient notes soreness after hip exercises.    Patient Stated Goals Improve balance.   Currently in Pain? Yes  see prior pain--no changes                         [facility] Adult PT Treatment/Exercise - 08/08/17 1046      Self-Care   Self-Care Other Self-Care Comments   Other Self-Care Comments  Discussed visual reliance during balance activities and PT recomended the patient use night lights at night, flash light in glove box for visual input.     Neuro Re-ed    Neuro Re-ed Details  Compliant surface standing with eyes open, head motion with CGA near support surfaces, and then with eyes closed and head stationery.  Patient is able to stand x 10 seconds with icnreased sway.  Also performed standing on solid surface adding head motion and then eyes closed with head stationery.  VOr x 1 gaze adaptation instructed with cues on speed and smaller range of motion.  Plan to add balance activities at next session after patient gets hip exercises in routine.     Exercises   Exercises Other Exercises   Other Exercises  STRETCHING:  Supine butterfly stretch, seated butterfly stretch, trunk roation supine, standing modified down dog for hamstring stretch moving into trunk extension.  Sitting tennis ball  roll for plantar surface stretching, seated toe flexion with AAROM for overpressure.  Trunk rotation seated with one knee bent to chest.                 PT Education - 08/08/17 1100    Education provided Yes   Education Details *plan to wait to add HEP / patient instructions to next session home program.   Person(s) Educated Patient   Methods Explanation;Demonstration   Comprehension Verbalized understanding;Returned demonstration;Need further instruction          PT Short Term Goals - 08/08/17 1214      PT SHORT TERM GOAL #1   Title The patient will be indep with HEP for LE strengthening, balance, and general mobility.   Time 4   Period Weeks   Status Achieved     PT SHORT TERM GOAL #2   Title The patient will  maintain single limb stance x 10 seconds bilaterally to demo improving balance.   Time 4   Period Weeks     PT SHORT TERM GOAL #3   Title The patient will improve gait speed to > or equal to 3.0 ft/sec to demo improving functional mobility.   Baseline 2.66 ft/sec   Time 4   Period Weeks           PT Long Term Goals - 07/11/17 2147      PT LONG TERM GOAL #1   Title The patient will verbalize post d/c program for community activities.   Time 8   Period Weeks   Target Date 09/09/17     PT LONG TERM GOAL #2   Title The patient will improve Berg to > or equal to 54/56.   Baseline 50/56   Time 8   Period Weeks   Target Date 09/09/17     PT LONG TERM GOAL #3   Title The patient will negotiate 4 steps without handrails with reciprocal pattern.   Time 8   Period Weeks   Target Date 09/09/17               Plan - 08/08/17 1214    Clinical Impression Statement The patient has met STG for HEP.  PT to continue at 1x/week frequency encouraging stretching, LE strengthening, and balance activities to improve functional mobility.    PT Treatment/Interventions ADLs/Self Care Home Management;Therapeutic activities;Therapeutic exercise;Neuromuscular re-education;Balance training;Gait training;Stair training;Functional mobility training;Patient/family education   PT Next Visit Plan check HEP; add patient instructions from 08/08/17 to next visit's plan, dynamic gait, multi-sensory balance challenges dec'ing reliance on vision   Consulted and Agree with Plan of Care Patient      Patient will benefit from skilled therapeutic intervention in order to improve the following deficits and impairments:  Abnormal gait, Decreased balance, Decreased strength, Decreased range of motion, Impaired flexibility  Visit Diagnosis: Muscle weakness (generalized)  Other abnormalities of gait and mobility     Problem List Patient Active Problem List   Diagnosis Date Noted  . Abdominal pain  04/28/2017  . Other fatigue 04/28/2017  . Pneumonia 12/14/2016  . Neuropathy 07/30/2016  . TIA (transient ischemic attack) 06/22/2016  . New onset of headaches after age 84 06/22/2016  . Dysphagia   . Allergic reaction 01/11/2015  . Arthritis of right ankle 10/09/2013  . Pes cavus 10/09/2013  . Loss of transverse plantar arch 10/09/2013  . Allergic rhinitis 05/14/2013  . BRONCHIECTASIS 12/15/2010  . ESOPHAGEAL MOTILITY DISORDER 01/26/2008  . GERD 01/26/2008  . GASTROPARESIS 01/26/2008  [place], PT 08/08/2017, 12:16 PM  [facility] [address] [place], [place], [address] Phone: [phone]   Fax:  [phone]  Name: VERNELL BACK MRN: [id number] Date of Birth: 1941-11-04

## 2017-08-12 ENCOUNTER — Encounter: Payer: Self-pay | Admitting: Rehabilitative and Restorative Service Providers"

## 2017-08-15 ENCOUNTER — Ambulatory Visit: Payer: Medicare Other | Attending: Geriatric Medicine | Admitting: Rehabilitative and Restorative Service Providers"

## 2017-08-15 DIAGNOSIS — M6281 Muscle weakness (generalized): Secondary | ICD-10-CM | POA: Diagnosis present

## 2017-08-15 DIAGNOSIS — R2689 Other abnormalities of gait and mobility: Secondary | ICD-10-CM | POA: Diagnosis present

## 2017-08-15 NOTE — Patient Instructions (Addendum)
PROM: Toe Flexion / Extension    Gently grasp right toes and curl then straighten them. Hold each position __20__ seconds. Repeat _5___ times per set. Do __1__ sets per session. Do __1-2__ sessions per day. Have someone else move foot.  http://orth.exer.us/67   Copyright  VHI. All rights reserved.   TENNIS BALL STRETCH:  Sitting, roll foot over tennis ball within tolerable range and pressure x 2 minutes.  Then place tennis ball under ball of foot and put heel down.  Flex toes and then extend them around tennis ball.   Gaze Stabilization - Tip Card  1.Target must remain in focus, not blurry, and appear stationary while head is in motion. 2.Perform exercises with small head movements (45 to either side of midline). 3.Increase speed of head motion so long as target is in focus. 4.If you wear eyeglasses, be sure you can see target through lens (therapist will give specific instructions for bifocal / progressive lenses). 5.These exercises may provoke dizziness or nausea. Work through these symptoms. If too dizzy, slow head movement slightly. Rest between each exercise. 6.Exercises demand concentration; avoid distractions. 7.For safety, perform standing exercises close to a counter, wall, corner, or next to someone.  Copyright  VHI. All rights reserved.   Gaze Stabilization - Standing Feet Apart   Feet shoulder width apart, keeping eyes on target on wall 3 feet away, tilt head down slightly and move head side to side for 30 seconds.  *Work up to tolerating 60 seconds, as able. Do 2-3 sessions per day.   Copyright  VHI. All rights reserved.   Feet Apart (Compliant Surface) Varied Arm Positions - Eyes Closed    Stand on compliant surface: __pillow__ with feet shoulder width apart and arms out. Close eyes and visualize upright position. Hold__20__ seconds. Repeat __3__ times per session. Do _2___ sessions per day.  Copyright  VHI. All rights reserved.    KNEE: Flexion -  Prone    Bend knee. Raise heel toward buttocks. Do not raise hips. _5__ reps per set, _1__ sets per day.   Copyright  VHI. All rights reserved.    Hip Extension    Lying face down, raise leg just off floor. Keep leg straight. Hold 3 count. Lower leg to floor. Repeat __5__ times each leg.  Copyright  VHI. All rights reserved.   Abduction: Side Leg Lift (Eccentric) - Side-Lying   Lie on side. Lift top leg slightly higher than shoulder level. Keep top leg straight with body, toes pointing forward. Slowly lower for 3-5 seconds. _10__ reps per set, _1__ sets per day. Copyright  VHI. All rights reserved.   Strengthening: Straight Leg Raise (Phase 1)   Tighten muscles on front of right thigh, then lift leg _6-8___ inches from surface, keeping knee locked.  Repeat _5___ times per set. Do __1__ sets per session. Do __1__ sessions per day.  http://orth.exer.us/614   Copyright  VHI. All rights reserved.   Heel Cord Stretch    Place one leg forward, bent, other leg behind and straight. Lean forward keeping back heel flat. Hold __30__ seconds while counting out loud. Repeat with other leg. Repeat __3__ times. Do __2__ sessions per day.  http://gt2.exer.us/512  Copyright  VHI. All rights reserved.  SINGLE LIMB STANCE    Stance: single leg on floor. Raise leg. Hold _10-15__ seconds. Repeat with other leg. _3__ reps per set, 2 times per day.  Copyright  VHI. All rights reserved.  Piriformis Stretch, Sitting    Sit, one ankle  on opposite knee, same-side hand on crossed knee. Push down on knee, keeping spine straight. Lean torso forward, with flat back, until tension is felt in hamstrings and gluteals of crossed-leg side. Hold _30__ seconds.  Repeat _3__ times on each side per session. Do _2__ sessions per day.

## 2017-08-15 NOTE — Therapy (Signed)
[facility] [address] [place], [place], [address] Phone: [phone]   Fax:  [phone]  Physical Therapy Treatment  Patient Details  Name: [name] MRN: [id number] Date of Birth: 1940-12-02 Referring Provider: Ellouise Newer, MD  Encounter Date: 08/15/2017      PT End of Session - 08/15/17 1952    Visit Number 4   Number of Visits 12   Date for PT Re-Evaluation 09/09/17   Authorization Type G code every 10th visit   PT Start Time 1022   PT Stop Time 1102   PT Time Calculation (min) 40 min   Activity Tolerance Patient tolerated treatment well   Behavior During Therapy [facility] for tasks assessed/performed      Past Medical History:  Diagnosis Date  . Allergy   . Arthritis   . BOOP (bronchiolitis obliterans with organizing pneumonia) ([place])   . Bronchiectasis   . Esophageal dysmotility   . Gastroparesis   . GERD (gastroesophageal reflux disease)    severe  . Iron deficiency anemia   . Neuropathy   . Nutcracker esophagus   . Pneumonia   . Pulmonary fibrosis ([place])   . Raynaud's disease   . Right lower lobe pneumonia December 2013    Past Surgical History:  Procedure Laterality Date  . [place] STUDY N/A 10/13/2015   Procedure: [place] STUDY;  Surgeon: Gatha Mayer, MD;  Location: [facility];  Service: Endoscopy;  Laterality: N/A;  . BREAST LUMPECTOMY     left, x 2  . CATARACT EXTRACTION     both eyes  . CHOLECYSTECTOMY  2005  . COLONOSCOPY    . ESOPHAGEAL MANOMETRY N/A 10/06/2015   Procedure: ESOPHAGEAL MANOMETRY (EM);  Surgeon: Manus Gunning, MD;  Location: [facility];  Service: Gastroenterology;  Laterality: N/A;  . LUNG BIOPSY     vats right 2003  . TOTAL ABDOMINAL HYSTERECTOMY    . UPPER GASTROINTESTINAL ENDOSCOPY      There were no vitals filed for this visit.      Subjective Assessment - 08/15/17 1021    Subjective The patient reports exercises are going well.   The hardest exercise is trying to focus and turn your head.  The patient reports a h/o "inner ear" in her late 4s, early 86s.  She reports L ear pain with TMJ pain.  She is unsure whom to see and PT recommended she call primary care MD for visit.   She reports ball rolling exercise feels good to her feet.    Patient Stated Goals Improve balance.   Currently in Pain? Yes   Pain Score 7   can vary, feels "not as tight as it was prior to therapy"   Pain Location Leg   Pain Orientation Right;Left   Pain Descriptors / Indicators Burning;Cramping   Pain Type Chronic pain   Pain Onset More than a month ago   Pain Frequency Constant   Aggravating Factors  worse when on feet all day   Pain Relieving Factors tylenol arthritis            [facility] PT Assessment - 08/15/17 1029      Ambulation/Gait   Ambulation/Gait Yes   Ambulation/Gait Assistance 7: Independent   Ambulation Distance (Feet) 200 Feet   Assistive device None   Gait velocity 3.26 ft/sec   Stairs Yes   Stairs Assistance 6: Modified independent (Device/Increase time)   Stair Management Technique No rails;Alternating pattern   Number  of Stairs 4   Gait Comments slowed pace with stair negotiation without handrails.      Berg Balance Test   Sit to Stand Able to stand without using hands and stabilize independently   Standing Unsupported Able to stand safely 2 minutes   Sitting with Back Unsupported but Feet Supported on Floor or Stool Able to sit safely and securely 2 minutes   Stand to Sit Sits safely with minimal use of hands   Transfers Able to transfer safely, minor use of hands   Standing Unsupported with Eyes Closed Able to stand 10 seconds safely   Standing Ubsupported with Feet Together Able to place feet together independently and stand 1 minute safely   From Standing, Reach Forward with Outstretched Arm Can reach confidently >25 cm (10")   From Standing Position, Pick up Object from Floor Able to pick up shoe safely and  easily   From Standing Position, Turn to Look Behind Over each Shoulder Looks behind from both sides and weight shifts well   Turn 360 Degrees Able to turn 360 degrees safely in 4 seconds or less   Standing Unsupported, Alternately Place Feet on Step/Stool Able to stand independently and safely and complete 8 steps in 20 seconds   Standing Unsupported, One Foot in Front Able to plae foot ahead of the other independently and hold 30 seconds   Standing on One Leg Able to lift leg independently and hold 5-10 seconds   Total Score 54   Berg comment: 54/56 improved from 50/56                     [facility] Adult PT Treatment/Exercise - 08/15/17 1029      Self-Care   Self-Care Other Self-Care Comments   Other Self-Care Comments  Discussed dividing HEP into groups to add to current exercise routine.  Patient works out x hours each morning and PT encourages her to do less cardio (reduce from 2 hours down to 1.5 hours) in order to leave time for balance, hip strenghtening and flexibility.      Neuro Re-ed    Neuro Re-ed Details  Corner balance exercises progressing to compliant surface standing with eyes closed.  Reviewed gaze x 1 adaptation for HEP.  Rocker board with head motin, rocker board with eyes closed with min A.  Compliant foam standing with head motion, then eyes closed requiring min A for balance.      Exercises   Exercises Other Exercises   Other Exercises  Added toe flexion/extension                PT Education - 08/15/17 1054    Education provided Yes   Education Details See updated HEP CURRENT HEP.   Person(s) Educated Patient   Methods Explanation;Demonstration;Handout   Comprehension Verbalized understanding;Returned demonstration          PT Short Term Goals - 08/15/17 1025      PT SHORT TERM GOAL #1   Title The patient will be indep with HEP for LE strengthening, balance, and general mobility.   Time 4   Period Weeks   Status Achieved     PT  SHORT TERM GOAL #2   Title The patient will maintain single limb stance x 10 seconds bilaterally to demo improving balance.   Baseline 6 seconds bilaterally on 08/15/17   Time 4   Period Weeks   Status Partially Met     PT SHORT TERM GOAL #3  Title The patient will improve gait speed to > or equal to 3.0 ft/sec to demo improving functional mobility.   Baseline 3.26 ft/sec   Time 4   Period Weeks   Status Achieved           PT Long Term Goals - 08/15/17 1028      PT LONG TERM GOAL #1   Title The patient will verbalize post d/c program for community activities.   Time 8   Period Weeks     PT LONG TERM GOAL #2   Title The patient will improve Berg to > or equal to 54/56.   Baseline Scored 54/56 on 08/15/17   Time 8   Period Weeks   Status Achieved     PT LONG TERM GOAL #3   Title The patient will negotiate 4 steps without handrails with reciprocal pattern.   Baseline Met on 08/15/17   Time 8   Period Weeks   Status Achieved               Plan - 08/15/17 1958    Clinical Impression Statement The patient has partially met STGs and met 2/3 LTGs.  PT to f/u in 2 weeks to ensure HEP progressing well.     PT Treatment/Interventions ADLs/Self Care Home Management;Therapeutic activities;Therapeutic exercise;Neuromuscular re-education;Balance training;Gait training;Stair training;Functional mobility training;Patient/family education   PT Next Visit Plan Check HEP, check LTG   Consulted and Agree with Plan of Care Patient      Patient will benefit from skilled therapeutic intervention in order to improve the following deficits and impairments:  Abnormal gait, Decreased balance, Decreased strength, Decreased range of motion, Impaired flexibility  Visit Diagnosis: Other abnormalities of gait and mobility  Muscle weakness (generalized)     Problem List Patient Active Problem List   Diagnosis Date Noted  . Abdominal pain 04/28/2017  . Other fatigue 04/28/2017  .  Pneumonia 12/14/2016  . Neuropathy 07/30/2016  . TIA (transient ischemic attack) 06/22/2016  . New onset of headaches after age 21 06/22/2016  . Dysphagia   . Allergic reaction 01/11/2015  . Arthritis of right ankle 10/09/2013  . Pes cavus 10/09/2013  . Loss of transverse plantar arch 10/09/2013  . Allergic rhinitis 05/14/2013  . BRONCHIECTASIS 12/15/2010  . ESOPHAGEAL MOTILITY DISORDER 01/26/2008  . GERD 01/26/2008  . GASTROPARESIS 01/26/2008    Kess Mcilwain, PT 08/15/2017, 7:59 PM  [facility] [address] [place], [place], [address] Phone: [phone]   Fax:  [phone]  Name: [name] MRN: [id number] Date of Birth: 03-Dec-1940

## 2017-08-22 ENCOUNTER — Encounter: Payer: Self-pay | Admitting: Rehabilitative and Restorative Service Providers"

## 2017-08-29 ENCOUNTER — Ambulatory Visit: Payer: Medicare Other | Admitting: Rehabilitative and Restorative Service Providers"

## 2017-08-29 DIAGNOSIS — R2689 Other abnormalities of gait and mobility: Secondary | ICD-10-CM

## 2017-08-29 DIAGNOSIS — M6281 Muscle weakness (generalized): Secondary | ICD-10-CM

## 2017-08-29 NOTE — Patient Instructions (Signed)
PROM: Toe Flexion / Extension    Gently grasp right toes and curl then straighten them. Hold each position __20__ seconds. Repeat _5___ times per set. Do __1__ sets per session. Do __1-2__ sessions per day. Have someone else move foot.  http://orth.exer.us/67  Copyright  VHI. All rights reserved.  TENNIS BALL STRETCH: Sitting, roll foot over tennis ball within tolerable range and pressure x 2 minutes. Then place tennis ball under ball of foot and put heel down. Flex toes and then extend them around tennis ball.   Gaze Stabilization - Tip Card  1.Target must remain in focus, not blurry, and appear stationary while head is in motion. 2.Perform exercises with small head movements (45 to either side of midline). 3.Increase speed of head motion so long as target is in focus. 4.If you wear eyeglasses, be sure you can see target through lens (therapist will give specific instructions for bifocal / progressive lenses). 5.These exercises may provoke dizziness or nausea. Work through these symptoms. If too dizzy, slow head movement slightly. Rest between each exercise. 6.Exercises demand concentration; avoid distractions. 7.For safety, perform standing exercises close to a counter, wall, corner, or next to someone.  Copyright  VHI. All rights reserved.  Gaze Stabilization - Standing Feet Apart   Feet shoulder width apart, keeping eyes on target on wall 3 feet away, tilt head down slightly and move head side to side for 30 seconds. *Work up to tolerating 60 seconds, as able. Do 2-3 sessions per day.   Copyright  VHI. All rights reserved.  Feet Apart (Compliant Surface) Varied Arm Positions - Eyes Closed    Stand on compliant surface: __pillow__ with feet shoulder width apart and arms out. Close eyes and visualize upright position. Hold__20__ seconds. Repeat __3__ times per session. Do _2___ sessions per day.  Copyright  VHI. All rights reserved.  Heel Cord  Stretch    Place one leg forward, bent, other leg behind and straight. Lean forward keeping back heel flat. Hold __30__ seconds while counting out loud. Repeat with other leg. Repeat __3__ times. Do __2__ sessions per day.  http://gt2.exer.us/512  Copyright  VHI. All rights reserved.  SINGLE LIMB STANCE    Stance: single leg on floor. Raise leg. Hold _10-15__ seconds. Repeat with other leg. _3__ reps per set, 2 times per day.  Copyright  VHI. All rights reserved.  Piriformis Stretch, Sitting    Sit, one ankle on opposite knee, same-side hand on crossed knee. Push down on knee, keeping spine straight. Lean torso forward, with flat back, until tension is felt in hamstrings and gluteals of crossed-leg side. Hold _30__ seconds.  Repeat _3__ times on each side per session. Do _1__ sessions per day.  ABDUCTION: Standing (Active)    Stand, feet flat. Lift right leg out to side.  Complete _1__ sets of __10_ repetitions. Perform _1__ sessions per day.  http://gtsc.exer.us/111   Copyright  VHI. All rights reserved.    Hip Extension (Standing)    Stand with support. Squeeze pelvic floor and hold. Move right leg backward with straight knee. Hold for _3__ seconds.  Repeat _10__ times. Do _1__ times a day. Repeat with other leg.    Copyright  VHI. All rights reserved.  Downward Dog    Stand with palms on *countertop. Roll ball forward. Hold __20_ seconds. Do _1__ sets of __3_ repetitions.  *SHIFT YOUR WEIGHT to one side to feel a stretch in the inner thigh.  Hold 20 seconds, then shift to the other side.  Copyright  VHI. All rights reserved.

## 2017-08-29 NOTE — Therapy (Signed)
[facility] [address] [place], [place], [address] Phone: [phone]   Fax:  [phone]  Physical Therapy Treatment and Discharge Summary  Patient Details  Name: [name] MRN: [id number] Date of Birth: 02/21/1941 Referring Provider: Ellouise Newer, MD  Encounter Date: 08/29/2017      PT End of Session - 08/29/17 0851    Visit Number 5   Number of Visits 12   Date for PT Re-Evaluation 09/09/17   Authorization Type G code every 10th visit   PT Start Time 0852   PT Stop Time 0930   PT Time Calculation (min) 38 min   Activity Tolerance Patient tolerated treatment well   Behavior During Therapy [facility] for tasks assessed/performed      Past Medical History:  Diagnosis Date  . Allergy   . Arthritis   . BOOP (bronchiolitis obliterans with organizing pneumonia) ([place])   . Bronchiectasis   . Esophageal dysmotility   . Gastroparesis   . GERD (gastroesophageal reflux disease)    severe  . Iron deficiency anemia   . Neuropathy   . Nutcracker esophagus   . Pneumonia   . Pulmonary fibrosis ([place])   . Raynaud's disease   . Right lower lobe pneumonia December 2013    Past Surgical History:  Procedure Laterality Date  . [place] STUDY N/A 10/13/2015   Procedure: [place] STUDY;  Surgeon: Gatha Mayer, MD;  Location: [facility];  Service: Endoscopy;  Laterality: N/A;  . BREAST LUMPECTOMY     left, x 2  . CATARACT EXTRACTION     both eyes  . CHOLECYSTECTOMY  2005  . COLONOSCOPY    . ESOPHAGEAL MANOMETRY N/A 10/06/2015   Procedure: ESOPHAGEAL MANOMETRY (EM);  Surgeon: Manus Gunning, MD;  Location: [facility];  Service: Gastroenterology;  Laterality: N/A;  . LUNG BIOPSY     vats right 2003  . TOTAL ABDOMINAL HYSTERECTOMY    . UPPER GASTROINTESTINAL ENDOSCOPY      There were no vitals filed for this visit.      Subjective Assessment - 08/29/17 0852    Subjective The patient reports "I was  doing good until this weather".  She notes she neglected her exercises due to power outage.  She has not had any falls, however has intermittent episodes of the right foot not moving well when she is fatigued.    Patient Stated Goals Improve balance.   Currently in Pain? Yes   Pain Score --  "it's been kind of bad lately"   Pain Location Leg   Pain Orientation Right;Left   Pain Descriptors / Indicators Aching;Burning   Pain Type Chronic pain   Pain Onset More than a month ago   Pain Frequency Constant   Aggravating Factors  worse when on feet all day   Pain Relieving Factors tylenol arthritis                         [facility] Adult PT Treatment/Exercise - 08/29/17 1022      Self-Care   Self-Care Other Self-Care Comments   Other Self-Care Comments  PT and patient discussed gym routine recommending adding LE strengthening to current cardio routine.  She spends 2 hours/day on stationery bike.  We discussed diversifying this routine, however she notes she plans to continue doing 2 hours/day of cardio.     Neuro Re-ed    Neuro Re-ed Details  Reviewed HEP activities including gaze  x 1 adaptation, standing single limb stance, compliant surface standing with eyes closed.       Exercises   Exercises Other Exercises   Other Exercises  Updated LE strengthening to be standing for hip extension and hip abduction.  Added modified down dog for stretching and reviewed heel cord and piriformis stretch.                 PT Education - 08/29/17 0917    Education provided Yes   Education Details HEP: updated supine strengthening to be standing and added down dog modified.    Person(s) Educated Patient   Methods Explanation;Demonstration;Handout   Comprehension Verbalized understanding;Returned demonstration          PT Short Term Goals - 08/15/17 1025      PT SHORT TERM GOAL #1   Title The patient will be indep with HEP for LE strengthening, balance, and general mobility.    Time 4   Period Weeks   Status Achieved     PT SHORT TERM GOAL #2   Title The patient will maintain single limb stance x 10 seconds bilaterally to demo improving balance.   Baseline 6 seconds bilaterally on 08/15/17   Time 4   Period Weeks   Status Partially Met     PT SHORT TERM GOAL #3   Title The patient will improve gait speed to > or equal to 3.0 ft/sec to demo improving functional mobility.   Baseline 3.26 ft/sec   Time 4   Period Weeks   Status Achieved           PT Long Term Goals - 08/29/17 5397      PT LONG TERM GOAL #1   Title The patient will verbalize post d/c program for community activities.   Time 8   Period Weeks   Status Achieved     PT LONG TERM GOAL #2   Title The patient will improve Berg to > or equal to 54/56.   Baseline Scored 54/56 on 08/15/17   Time 8   Period Weeks   Status Achieved     PT LONG TERM GOAL #3   Title The patient will negotiate 4 steps without handrails with reciprocal pattern.   Baseline Met on 08/15/17   Time 8   Period Weeks   Status Achieved               Plan - 08/29/17 6734    Clinical Impression Statement The patient met all STGs and LTGs.  She notes continuing to work on HEP appears to be strengthening hips.  She also feels neuropathy impacts her R LE when first rising from bed.  PT recommended she continue current home program, current wellness program post d/c.    PT Treatment/Interventions ADLs/Self Care Home Management;Therapeutic activities;Therapeutic exercise;Neuromuscular re-education;Balance training;Gait training;Stair training;Functional mobility training;Patient/family education   PT Next Visit Plan Check HEP, check LTG   Consulted and Agree with Plan of Care Patient      Patient will benefit from skilled therapeutic intervention in order to improve the following deficits and impairments:  Abnormal gait, Decreased balance, Decreased strength, Decreased range of motion, Impaired  flexibility  Visit Diagnosis: Other abnormalities of gait and mobility  Muscle weakness (generalized)    PHYSICAL THERAPY DISCHARGE SUMMARY  Visits from Start of Care: 5  Current functional level related to goals / functional outcomes: See above   Remaining deficits: Notes intermittent episodes of R leg not wanting to move well, worse  when getting up from bed. Tightness noted in hips. Neuropathic pain in bilateral LEs.   Education / Equipment: HEP, community wellness recommendations.  Plan: Patient agrees to discharge.  Patient goals were met. Patient is being discharged due to meeting the stated rehab goals.  ?????        Thank you for the referral of this patient. Rudell Cobb, MPT   Birdie Beveridge PT 08/29/2017, 10:24 AM  [facility] [address] [place], [place], [address] Phone: [phone]   Fax:  [phone]  Name: [name] MRN: [id number] Date of Birth: [birth year]

## 2017-09-05 ENCOUNTER — Ambulatory Visit: Payer: Medicare Other | Admitting: Rehabilitative and Restorative Service Providers"

## 2017-09-12 ENCOUNTER — Ambulatory Visit (INDEPENDENT_AMBULATORY_CARE_PROVIDER_SITE_OTHER): Payer: Medicare Other | Admitting: Neurology

## 2017-09-12 ENCOUNTER — Encounter: Payer: Self-pay | Admitting: Neurology

## 2017-09-12 VITALS — BP 98/60 | HR 68 | Ht 65.0 in | Wt 97.0 lb

## 2017-09-12 DIAGNOSIS — G629 Polyneuropathy, unspecified: Secondary | ICD-10-CM | POA: Diagnosis not present

## 2017-09-12 DIAGNOSIS — G44209 Tension-type headache, unspecified, not intractable: Secondary | ICD-10-CM | POA: Diagnosis not present

## 2017-09-12 NOTE — Patient Instructions (Signed)
1. Try Lyrica 50mg  at bedtime. If more helpful than gabapentin with less side effects, call our office and we will send refills 2. Continue home exercise program 3. Good luck with [facility] [place]! 4. Follow-up in 4-5 months, call for any changes

## 2017-09-12 NOTE — Progress Notes (Signed)
NEUROLOGY FOLLOW UP OFFICE NOTE  [name] [id number]  HISTORY OF PRESENT ILLNESS: I had the pleasure of seeing [name] in follow-up in the neurology clinic on 09/12/2017.  The patient was last seen 4 months ago for neuropathy. She was reporting constant burning and numbness in both legs. She was also reporting pain in her veins with throbbing and swelling. She has seen Vascular surgery, symptoms consistent with chronic venous insufficiency, duplex showed limited valvular insufficiency. It was felt that symptoms are early, for now compression stockings were advised. She has noticed this is helpful. She was started on gabapentin for neuropathic pain, she had difficulty with the pill form and has been taking liquid solution 318m/6 ML, but felt tired even on low dose 279m(10039m She only takes .5-1mL51m needed when she knows she will be on her feet, and notes that it does help. She underwent PT and reports that this has helped her a lot. She occasionally trips on her right foot but not as bad. She continues with HEP. She has headaches 1-2 times a week and takes prn Tylenol. When more severe, she has found that walking really helps with the headache. She was initially seen after an episode of headache with dizziness and speech difficulty in early July 2017, no further similar spells. MRI brain unremarkable. MRA head showed a short segment of moderate stenosis of the left P1 and diminished distal flow-related signal within the left PCA distribution. Her lipid panel showed a total cholesterol of 214, LDL 106. Her ESR and CRP were normal.   HPI: This is a pleasant 76 y82RH woman with a history of GERD, nutcracker esophagus, pulmonary fibrosis, in her usual state of health until early June 2017 while walking into a grocery she suddenly had left temporal pain, then it felt like she was hit on both side of the head with a hammer. She reports she was "hit like gangbusters," she was very dizzy with a spinning  sensation, nauseated. She held on to the cart for balance and had difficulty concentrating. Her vision was blurred. She managed to finish her chore and walked to the car, then noticed that she was having a hard time talking. She could see the sentence broken up in her head and could not get the words out. She was able to drive home with no focal symptoms noted. The episode lasted 30 minutes or so, she felt weak and tired after. Since then she has had at least 2 or 3 more episodes of headaches with dizziness, which did not progress to speech difficulties. The symptoms would last around 5 minutes, no clear positional component to the dizziness. When she feels it coming on, she massages her head, which seems to help. She was unsure if Tylenol helped. She denies any prior history of headaches. She denies any vision loss, photo/phonophobia, diplopia, dysarthria, jaw claudication, hearing loss. She has occasional pulsatile tinnitus more in the afternoons. She has GERD which makes swallowing difficult. She has a history of neuropathy affecting both feet, R>L, with burning and numbness/tingling for several years. She has been doing laser therapy for venous insufficiency/varicose veins. She denies any falls but trips more. Sleep varies. She is very active and rides her bike for a couple of hours daily. She works at a preschool. Her mother had a stroke at age 21. 42PAST MEDICAL HISTORY: Past Medical History:  Diagnosis Date  . Allergy   . Arthritis   . BOOP (bronchiolitis obliterans  with organizing pneumonia) ([place])   . Bronchiectasis   . Esophageal dysmotility   . Gastroparesis   . GERD (gastroesophageal reflux disease)    severe  . Iron deficiency anemia   . Neuropathy   . Nutcracker esophagus   . Pneumonia   . Pulmonary fibrosis ([place])   . Raynaud's disease   . Right lower lobe pneumonia December 2013    MEDICATIONS:  Outpatient Encounter Prescriptions as of 09/12/2017  Medication Sig  .  Acetaminophen (TYLENOL EXTRA STRENGTH PO) Take 350 mg by mouth 3 (three) times daily as needed.   [name] aspirin EC 81 MG tablet Take 81 mg by mouth daily.  . benzonatate (TESSALON) 200 MG capsule Take 1 capsule (200 mg total) by mouth 3 (three) times daily as needed for cough.  . Calcium-Vitamin D-Vitamin K (VIACTIV PO) Take by mouth.  . dexlansoprazole (DEXILANT) 60 MG capsule Take 1 capsule (60 mg total) by mouth daily.  . fexofenadine (ALLEGRA) 180 MG tablet Take 180 mg by mouth daily.  . folic acid (FOLVITE) 1 MG tablet Take 1 tablet (1 mg total) by mouth daily.  . Gabapentin 300 MG/6ML SOLN Take 2 mLs by mouth at bedtime.  . montelukast (SINGULAIR) 10 MG tablet TAKE 1 TABLET AT BEDTIME  . Multiple Vitamins-Calcium (VIACTIV MULTI-VITAMIN) CHEW Chew 1 tablet by mouth 4 (four) times daily.    [name] PREMARIN 0.3 MG tablet Take 0.3 mg by mouth daily.   . Thiamine Mononitrate (VITAMIN B1 PO) Take by mouth.   No facility-administered encounter medications on file as of 09/12/2017.     ALLERGIES: Allergies  Allergen Reactions  . Cortisone     unsure  . Prednisone   . Sulfonamide Derivatives     Unsure of reaction    FAMILY HISTORY: Family History  Problem Relation Age of Onset  . Lymphoma Mother   . Heart disease Father   . Lung cancer Paternal Uncle   . Heart disease Paternal Grandfather   . Leukemia Paternal Grandmother   . Cancer Maternal Grandmother        unknown type  . Colon cancer Neg Hx   . Esophageal cancer Neg Hx   . Rectal cancer Neg Hx   . Stomach cancer Neg Hx     SOCIAL HISTORY: Social History   Social History  . Marital status: Widowed    Spouse name: N/A  . Number of children: 2  . Years of education: N/A   Occupational History  . retired Pharmacist, hospital   .  Retired   Social History Main Topics  . Smoking status: Former Smoker    Types: Cigarettes    Quit date: 11/15/1968  . Smokeless tobacco: Never Used     Comment: 3 cigs a day  . Alcohol use No  . Drug  use: No  . Sexual activity: Not on file   Other Topics Concern  . Not on file   Social History Narrative  . No narrative on file    REVIEW OF SYSTEMS: Constitutional: No fevers, chills, or sweats, no generalized fatigue, change in appetite Eyes: No visual changes, double vision, eye pain Ear, nose and throat: No hearing loss, ear pain, nasal congestion, sore throat Cardiovascular: No chest pain, palpitations Respiratory:  No shortness of breath at rest or with exertion, wheezes GastrointestinaI: No nausea, vomiting, diarrhea, abdominal pain, fecal incontinence Genitourinary:  No dysuria, urinary retention or frequency Musculoskeletal:  + neck pain, back pain Integumentary: No rash, pruritus, skin lesions Neurological: as above  Psychiatric: No depression, insomnia, anxiety Endocrine: No palpitations, fatigue, diaphoresis, mood swings, change in appetite, change in weight, increased thirst Hematologic/Lymphatic:  No anemia, purpura, petechiae. Allergic/Immunologic: no itchy/runny eyes, nasal congestion, recent allergic reactions, rashes  PHYSICAL EXAM: Vitals:   09/12/17 1324  BP: 98/60  Pulse: 68  SpO2: 97%   General: No acute distress Head:  Normocephalic/atraumatic Neck: supple, no paraspinal tenderness, full range of motion Heart:  Regular rate and rhythm Lungs:  Clear to auscultation bilaterally Back: No paraspinal tenderness Skin/Extremities: No rash, no edema, +varicose veins Neurological Exam: alert and oriented to person, place, and time. No aphasia or dysarthria. Fund of knowledge is appropriate.  Recent and remote memory are intact.  Attention and concentration are normal.    Able to name objects and repeat phrases. Cranial nerves: Pupils equal, round, reactive to light. Extraocular movements intact with no nystagmus. Visual fields full. Facial sensation intact. No facial asymmetry. Tongue, uvula, palate midline.  Motor: Bulk and tone normal, muscle strength 5/5  throughout with no pronator drift.  Sensation intact to all modalities on both UE, decreased vibration and pin to knees bilaterally (similar to prior).  Deep tendon reflexes unable to elicit, toes downgoing.  Finger to nose testing intact.  Gait narrow-based and steady, difficulty with tandem walk.  Romberg positive sway.  IMPRESSION: This is a pleasant 76 yo RH woman with a history of GERD, nutcracker esophagus, pulmonary fibrosis, who had a transient episode of headache, vertigo, and expressive aphasia last June 2017, concerning for TIA. MRI brain no acute changes. Her MRA head and neck was unremarkable except for moderate stenosis of the left P1 and diminished distal flow-related signal within the left PCA distribution. Continue daily aspirin and control of vascular risk factors. No further episodes since June 2017. Her main concern continues to be the burning pain in her feet. She had side effects on low dose gabapentin and only takes it prn, but notes it does help. She will try low dose Lyrica 75m qhs and will let [place] if helpful so that refills will be sent. Side effects were discussed. Continue with home PT balance exercises. 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  Continue to monitor headaches, she knows to minimize Tylenol to 2-3 times a week to avoid rebound headaches. She will follow-up in 4-5 months and knows to call for any changes.   Thank you for allowing me to participate in her care.  Please do not hesitate to call for any questions or concerns.  The duration of this appointment visit was 15 minutes of face-to-face time with the patient.  Greater than 50% of this time was spent in counseling, explanation of diagnosis, planning of further management, and coordination of care.   KEllouise Newer M.D.   CC: Dr. SFelipa Eth

## 2017-09-21 ENCOUNTER — Encounter: Payer: Self-pay | Admitting: Rehabilitative and Restorative Service Providers"

## 2017-09-21 ENCOUNTER — Encounter: Payer: Self-pay | Admitting: Gastroenterology

## 2017-09-21 ENCOUNTER — Ambulatory Visit (INDEPENDENT_AMBULATORY_CARE_PROVIDER_SITE_OTHER): Payer: Medicare Other | Admitting: Gastroenterology

## 2017-09-21 VITALS — BP 110/68 | HR 72 | Ht 64.0 in | Wt 93.4 lb

## 2017-09-21 DIAGNOSIS — K219 Gastro-esophageal reflux disease without esophagitis: Secondary | ICD-10-CM

## 2017-09-21 DIAGNOSIS — R1310 Dysphagia, unspecified: Secondary | ICD-10-CM | POA: Diagnosis not present

## 2017-09-21 DIAGNOSIS — R1319 Other dysphagia: Secondary | ICD-10-CM

## 2017-09-21 DIAGNOSIS — K224 Dyskinesia of esophagus: Secondary | ICD-10-CM

## 2017-09-21 NOTE — Progress Notes (Signed)
09/21/2017 [name] [id number] January 10, 1941   HISTORY OF PRESENT ILLNESS:  This is a 76 year old female who is known to Dr. Havery Moros. She has history of reflux/dysphagia related to nutcracker esophagus and iron deficiency anemia for which he has been following her for the past 2 years. She's had extensive GI evaluation in regards to these issues (see Dr. Doyne Keel note from 09/14/2016 for outline of evaluation).  She is here again today with complaints of burning epigastric abdominal pain, hoarseness, dysphagia, and nausea.  She is currently on Dexilant 60 mg daily at night and Nexium OTC in the morning along with some all natural acid reflux medication--she just recently started this and feels like it may be helping some.  There has been some struggle in the past with getting her PPI's straightened out as was indicated in my last office note from 04/2017; it seems that this may still be the case.  She says that she really does not feel any better since her appt here in June.  She says that her throat hurts all the time and she has indigestion/burning epigastric abdominal pain that keeps her awake at night.  Says that it hurts to swallow and that food and liquids get ung up and "strangle" her.  She was started on diltiazem in the past, but she did not tolerate it well.   Past Medical History:  Diagnosis Date  . Allergy   . Arthritis   . BOOP (bronchiolitis obliterans with organizing pneumonia) ([place])   . Bronchiectasis   . Esophageal dysmotility   . Gastroparesis   . GERD (gastroesophageal reflux disease)    severe  . Iron deficiency anemia   . Neuropathy   . Nutcracker esophagus   . Pneumonia   . Pulmonary fibrosis ([place])   . Raynaud's disease   . Right lower lobe pneumonia December 2013   Past Surgical History:  Procedure Laterality Date  . BREAST LUMPECTOMY     left, x 2  . CATARACT EXTRACTION     both eyes  . CHOLECYSTECTOMY  2005  . COLONOSCOPY    . LUNG BIOPSY     vats right 2003  . TOTAL ABDOMINAL HYSTERECTOMY    . UPPER GASTROINTESTINAL ENDOSCOPY      reports that she quit smoking about 48 years ago. Her smoking use included cigarettes. she has never used smokeless tobacco. She reports that she does not drink alcohol or use drugs. family history includes Cancer in her maternal grandmother; Heart disease in her father and paternal grandfather; Leukemia in her paternal grandmother; Lung cancer in her paternal uncle; Lymphoma in her mother. Allergies  Allergen Reactions  . Cortisone     unsure  . Prednisone   . Sulfonamide Derivatives     Unsure of reaction      Outpatient Encounter Medications as of 09/21/2017  Medication Sig  . Acetaminophen (TYLENOL EXTRA STRENGTH PO) Take 350 mg by mouth 3 (three) times daily as needed.   [name] aspirin EC 81 MG tablet Take 81 mg by mouth daily.  . benzonatate (TESSALON) 200 MG capsule Take 1 capsule (200 mg total) by mouth 3 (three) times daily as needed for cough.  . Calcium-Vitamin D-Vitamin K (VIACTIV PO) Take by mouth.  . dexlansoprazole (DEXILANT) 60 MG capsule Take 1 capsule (60 mg total) by mouth daily.  . fexofenadine (ALLEGRA) 180 MG tablet Take 180 mg by mouth daily.  . folic acid (FOLVITE) 1 MG tablet Take 1 tablet (1 mg total)  by mouth daily.  . Gabapentin 300 MG/6ML SOLN Take 2 mLs by mouth at bedtime.  . montelukast (SINGULAIR) 10 MG tablet TAKE 1 TABLET AT BEDTIME  . Multiple Vitamins-Calcium (VIACTIV MULTI-VITAMIN) CHEW Chew 1 tablet by mouth 4 (four) times daily.    [name] PREMARIN 0.3 MG tablet Take 0.3 mg by mouth daily.   . Thiamine Mononitrate (VITAMIN B1 PO) Take by mouth.   No facility-administered encounter medications on file as of 09/21/2017.      REVIEW OF SYSTEMS  : All other systems reviewed and negative except where noted in the History of Present Illness.   PHYSICAL EXAM: BP 110/68   Pulse 72   Ht 5\' 4"  (1.626 m)   Wt 93 lb 6.4 oz (42.4 kg)   BMI 16.03 kg/m  General: Well  developed, but very thin white female in no acute distress Head: Normocephalic and atraumatic Eyes:  Sclerae anicteric, conjunctiva pink. Ears: Normal auditory acuity Lungs: Clear throughout to auscultation; no increased WOB. Heart: Regular rate and rhythm; no M/R/G. Abdomen: Soft, non-distended.  BS present.  Non-tender. Musculoskeletal: Symmetrical with no gross deformities  Skin: No lesions on visible extremities Extremities: No edema  Neurological: Alert oriented x 4, grossly non-focal Psychological:  Alert and cooperative. Normal mood and affect  ASSESSMENT AND PLAN: -76 year old female with history of GERD/dysphagia:  Extensive GI evaluation over the years.  Has nutcracker esophagus and I suspect possible component of functional dyspepsia.  Here with recurrent complaints of dysphagia, burning epigastric abdominal pain, hoarseness, and nausea.  It has been difficult to get her on track with her medications/PPI's in the past.  She is not very tolerate to other medications including diltiazem.  After extensive discussion she has decided to try FDgard BID as well as this new "natural" acid medication for now.  These likely will not help her dysphagia.  May need to consider trazadone or imipramine.  Will have her follow-up with Dr. Havery Moros.  **25 minutes were spent with this patient in which at least 50% was spend discussing treatment options.  CC:  [name], MD

## 2017-09-21 NOTE — Patient Instructions (Signed)
Call [phone] and choose option 2. Ask for Edison Nasuti can give Deziray Nabi the information on the medication, acid reducing .   We have given you an  [place] coupon.  Call us in 3 weeks and ask for Patty or Almyra Free to give an update. Call the same number and option 2.

## 2017-09-21 NOTE — Therapy (Signed)
[facility] [address] [place], [place], [address] Phone: [phone]   Fax:  [phone]  Patient Details  Name: [name] MRN: [id number] Date of Birth: 05/03/1941 Referring Provider:  No ref. provider found  Encounter Date: last encounter 08/29/17    PHYSICAL THERAPY DISCHARGE SUMMARY  Visits from Start of Care: 5   Current functional level related to goals / functional outcomes: PT Short Term Goals - 08/15/17 1025      PT SHORT TERM GOAL #1   Title  The patient will be indep with HEP for LE strengthening, balance, and general mobility.    Time  4    Period  Weeks    Status  Achieved      PT SHORT TERM GOAL #2   Title  The patient will maintain single limb stance x 10 seconds bilaterally to demo improving balance.    Baseline  6 seconds bilaterally on 08/15/17    Time  4    Period  Weeks    Status  Partially Met      PT SHORT TERM GOAL #3   Title  The patient will improve gait speed to > or equal to 3.0 ft/sec to demo improving functional mobility.    Baseline  3.26 ft/sec    Time  4    Period  Weeks    Status  Achieved      PT Long Term Goals - 08/29/17 0321      PT LONG TERM GOAL #1   Title  The patient will verbalize post d/c program for community activities.    Time  8    Period  Weeks    Status  Achieved      PT LONG TERM GOAL #2   Title  The patient will improve Berg to > or equal to 54/56.    Baseline  Scored 54/56 on 08/15/17    Time  8    Period  Weeks    Status  Achieved      PT LONG TERM GOAL #3   Title  The patient will negotiate 4 steps without handrails with reciprocal pattern.    Baseline  Met on 08/15/17    Time  8    Period  Weeks    Status  Achieved         Remaining deficits: Intermittent R foot catching- has HEP    Education / Equipment: Home program, community wellness.  Plan: Patient agrees to discharge.  Patient goals were met. Patient is being discharged  due to meeting the stated rehab goals.  ?????      Thank you for the referral of this patient. Rudell Cobb, MPT     Ericha Whittingham 09/21/2017, 11:36 AM  [facility] [address] [place], [place], [address] Phone: [phone]   Fax:  [phone]

## 2017-09-22 ENCOUNTER — Telehealth: Payer: Self-pay | Admitting: *Deleted

## 2017-09-22 NOTE — Telephone Encounter (Signed)
Spoke to the patient and she did say she took the [employer] yesterday and today and it is helping.  The ingredients in the supplement she is taking : [employer], Camomile, Licorice extract, Alovera extract, Lemon Balm extract.  I advised Alonza Bogus PA.  Janett Billow said she could take that daily and take the FD GARD.

## 2017-09-27 ENCOUNTER — Encounter: Payer: Self-pay | Admitting: Gastroenterology

## 2017-09-27 NOTE — Progress Notes (Signed)
Agree with assessment and plan. I think would agree that a trial of TCA may beneficial to her management - I doubt she is having breakthrough acid reflux on PPI. She should only take one PPI, not both. She did not tolerate trial of calcium channel blocker for nutcracker esophagus. If her symptoms persist I would recommend a TCA such as desipramine, with less anticholingeric side effects. I can discuss it with her at her follow up.

## 2017-09-28 ENCOUNTER — Telehealth: Payer: Self-pay | Admitting: *Deleted

## 2017-09-28 NOTE — Telephone Encounter (Signed)
I called and spoke to the patient. I was trying to ask her how she was feeling. She could barely speak, she was so hoarse and has painful swallowing as well.  I didn't want to keep her on the ph one too long. I told her I will speak to Janett Billow and get back to her.

## 2017-09-29 NOTE — Telephone Encounter (Signed)
Called patient this am and LM to advise she will be getting a call from [facility] regarding an appiontment.  I advised she is assigned to Dr. Claiborne Rigg there , however he doesn't have an open appointment until 12-4.  Shayla there is talking to the  Doctor on call about a sooner appointment.  I did stress the urgency of her symptoms to [facility].

## 2017-10-03 ENCOUNTER — Other Ambulatory Visit: Payer: Self-pay | Admitting: Pulmonary Disease

## 2017-10-03 DIAGNOSIS — K219 Gastro-esophageal reflux disease without esophagitis: Secondary | ICD-10-CM | POA: Insufficient documentation

## 2017-10-03 DIAGNOSIS — M2669 Other specified disorders of temporomandibular joint: Secondary | ICD-10-CM | POA: Insufficient documentation

## 2017-10-04 ENCOUNTER — Other Ambulatory Visit: Payer: Self-pay | Admitting: Physician Assistant

## 2017-10-04 DIAGNOSIS — K219 Gastro-esophageal reflux disease without esophagitis: Secondary | ICD-10-CM

## 2017-10-04 DIAGNOSIS — R1310 Dysphagia, unspecified: Secondary | ICD-10-CM

## 2017-10-05 ENCOUNTER — Ambulatory Visit
Admission: RE | Admit: 2017-10-05 | Discharge: 2017-10-05 | Disposition: A | Discharge status: Home / Self Care | Payer: Medicare Other | Source: Ambulatory Visit | Attending: Geriatric Medicine | Admitting: Geriatric Medicine

## 2017-10-05 ENCOUNTER — Telehealth: Payer: Self-pay | Admitting: Pulmonary Disease

## 2017-10-05 ENCOUNTER — Other Ambulatory Visit: Payer: Self-pay | Admitting: Geriatric Medicine

## 2017-10-05 DIAGNOSIS — M79642 Pain in left hand: Secondary | ICD-10-CM

## 2017-10-05 DIAGNOSIS — M79641 Pain in right hand: Secondary | ICD-10-CM

## 2017-10-05 MED ORDER — BENZONATATE 200 MG PO CAPS: 200.0000 mg | ORAL_CAPSULE | Freq: Three times a day (TID) | ORAL | 1 refills | Status: DC | PRN

## 2017-10-05 NOTE — Telephone Encounter (Signed)
Ok to refill but needs ov next avail as doubt this will be enough

## 2017-10-05 NOTE — Telephone Encounter (Signed)
Pt is requesting refill for Tessalon. Pt reports of prod cough with think grey mucus, increased sob & wheezing.  Preferred pharmacy is [facility].   MW please advise, as VS is unavailable. Thanks.    12/23/16 AVS    Return in about 6 months (around 06/22/2017).  Flexeril 5 mg three times per day as needed for muscle spasm  Call if not feeling better in next few days  Saline nasal spray daily  Follow up in 6 months

## 2017-10-05 NOTE — Telephone Encounter (Signed)
Pt is aware recommendations and voiced her understanding.  Rx has been sent to preferred pharmacy.  Pt has been schedule for OV with TP on 10/10/17. Nothing further needed.

## 2017-10-09 NOTE — Telephone Encounter (Signed)
It is not clear to me which prescriptions are being requested for refill.

## 2017-10-10 ENCOUNTER — Encounter: Payer: Self-pay | Admitting: Adult Health

## 2017-10-10 ENCOUNTER — Ambulatory Visit (INDEPENDENT_AMBULATORY_CARE_PROVIDER_SITE_OTHER)
Admission: RE | Admit: 2017-10-10 | Discharge: 2017-10-10 | Disposition: A | Discharge status: Home / Self Care | Payer: Medicare Other | Source: Ambulatory Visit | Attending: Adult Health | Admitting: Adult Health

## 2017-10-10 ENCOUNTER — Ambulatory Visit
Admission: RE | Admit: 2017-10-10 | Discharge: 2017-10-10 | Disposition: A | Discharge status: Home / Self Care | Payer: Medicare Other | Source: Ambulatory Visit | Attending: Physician Assistant | Admitting: Physician Assistant

## 2017-10-10 ENCOUNTER — Ambulatory Visit (INDEPENDENT_AMBULATORY_CARE_PROVIDER_SITE_OTHER): Payer: Medicare Other | Admitting: Adult Health

## 2017-10-10 VITALS — BP 122/72 | HR 80 | Ht 63.5 in | Wt 95.4 lb

## 2017-10-10 DIAGNOSIS — K219 Gastro-esophageal reflux disease without esophagitis: Secondary | ICD-10-CM | POA: Diagnosis not present

## 2017-10-10 DIAGNOSIS — J479 Bronchiectasis, uncomplicated: Secondary | ICD-10-CM

## 2017-10-10 DIAGNOSIS — R1310 Dysphagia, unspecified: Secondary | ICD-10-CM

## 2017-10-10 MED ORDER — AMOXICILLIN-POT CLAVULANATE 875-125 MG PO TABS: 1.0000 | ORAL_TABLET | Freq: Two times a day (BID) | ORAL | 0 refills | Status: AC

## 2017-10-10 NOTE — Assessment & Plan Note (Signed)
Flare  Check cxr   Plan  . Patient Instructions  Augmentin 875mg  Twice daily  For 1 week . Take with food.  Mucinex DM Twice daily  As needed  Cough/congestion  Chest xray today .  Follow up with Dr. Halford Chessman  As planned in 1 month and As needed    Please contact office for sooner follow up if symptoms do not improve or worsen or seek emergency care

## 2017-10-10 NOTE — Progress Notes (Signed)
I have reviewed and agree with assessment/plan.  Chesley Mires, MD [facility] Pulmonary/Critical Care 10/10/2017, 5:58 PM Pager:  [phone]

## 2017-10-10 NOTE — Progress Notes (Signed)
@Patient  ID: [name], female    DOB: 1941/03/15, 76 y.o.   MRN: [id number]  Chief Complaint  Patient presents with  . Acute Visit    Bronchiectasis     Referring provider: [name], MD  HPI: 76 yo female former smoker followed for bronchiectasis and recurrent aspiration . Prior hx of cryptogenic organizing pneumonia   TEST  PFT 12/31/09 >> FEV1 1.84(84%), FEV1% 73, TLC 4.94(98%), DLCO 77%, +BD. CT chest 08/17/10 >> mild biapical scarring, basilar peribronchovascular nodularity and mild bronchiectasis. Echo 06/04/15 >> EF 55%, probable PFO  10/10/2017 Acute OV : Bronchiectasis  Pt presents for an acute office visit. Complains of 2.5 weeks of cough, congestion, thick mucus , dark grey , wheezing and shortness of breath. No otc ued.  Was doing well until this last couple of weeks.  Remains on Singulair and Allegra . [name] Appetite is low. Drinking fluids well.  Preschool teacher.   Has  GERD , had Esophagram today that showed small HH. No stricture/mass or motility .  On Dexilant and Nexium . GERD diet . She is following with GI .    Allergies  Allergen Reactions  . Cortisone     unsure  . Prednisone   . Sulfonamide Derivatives     Unsure of reaction    Immunization History  Administered Date(s) Administered  . Influenza Split 11/06/2011, 11/06/2012  . Influenza Whole 10/29/2009, 08/15/2010  . Influenza, High Dose Seasonal PF 12/09/2016  . Influenza,inj,Quad PF,6+ Mos 10/15/2014, 11/22/2014, 11/19/2015  . Pneumococcal Conjugate-13 09/17/2013  . Pneumococcal Polysaccharide-23 10/29/2004, 01/08/2015    Past Medical History:  Diagnosis Date  . Allergy   . Arthritis   . BOOP (bronchiolitis obliterans with organizing pneumonia) ([place])   . Bronchiectasis   . Esophageal dysmotility   . Gastroparesis   . GERD (gastroesophageal reflux disease)    severe  . Iron deficiency anemia   . Neuropathy   . Nutcracker esophagus   . Pneumonia   . Pulmonary fibrosis  ([place])   . Raynaud's disease   . Right lower lobe pneumonia December 2013    Tobacco History: Social History   Tobacco Use  Smoking Status Former Smoker  . Types: Cigarettes  . Last attempt to quit: 11/15/1968  . Years since quitting: 48.9  Smokeless Tobacco Never Used  Tobacco Comment   3 cigs a day   Counseling given: Not Answered Comment: 3 cigs a day   Outpatient Encounter Medications as of 10/10/2017  Medication Sig  . Acetaminophen (TYLENOL EXTRA STRENGTH PO) Take 350 mg by mouth 3 (three) times daily as needed.   [name] aspirin EC 81 MG tablet Take 81 mg by mouth daily.  . benzonatate (TESSALON) 200 MG capsule Take 1 capsule (200 mg total) by mouth 3 (three) times daily as needed for cough.  . Calcium-Vitamin D-Vitamin K (VIACTIV PO) Take by mouth.  . dexlansoprazole (DEXILANT) 60 MG capsule Take 1 capsule (60 mg total) by mouth daily.  . fexofenadine (ALLEGRA) 180 MG tablet Take 180 mg by mouth daily.  . folic acid (FOLVITE) 1 MG tablet Take 1 tablet (1 mg total) by mouth daily.  . Gabapentin 300 MG/6ML SOLN Take 2 mLs by mouth at bedtime.  . montelukast (SINGULAIR) 10 MG tablet TAKE 1 TABLET AT BEDTIME  . Multiple Vitamins-Calcium (VIACTIV MULTI-VITAMIN) CHEW Chew 1 tablet by mouth 4 (four) times daily.    [name] PREMARIN 0.3 MG tablet Take 0.3 mg by mouth daily.   . Thiamine Mononitrate (  VITAMIN B1 PO) Take by mouth.  [name] amoxicillin-clavulanate (AUGMENTIN) 875-125 MG tablet Take 1 tablet by mouth 2 (two) times daily for 7 days.   No facility-administered encounter medications on file as of 10/10/2017.      Review of Systems  Constitutional:   No  weight loss, night sweats,  Fevers, chills, fatigue, or  lassitude.  HEENT:   No headaches,  Difficulty swallowing,  Tooth/dental problems, or  Sore throat,                No sneezing, itching, ear ache,  +nasal congestion, post nasal drip,   CV:  No chest pain,  Orthopnea, PND, swelling in lower extremities, anasarca, dizziness,  palpitations, syncope.   GI  No , abdominal pain, nausea, vomiting, diarrhea, change in bowel habits, loss of appetite, bloody stools.   Resp:  No chest wall deformity  Skin: no rash or lesions.  GU: no dysuria, change in color of urine, no urgency or frequency.  No flank pain, no hematuria   MS:  No joint pain or swelling.  No decreased range of motion.  No back pain.    Physical Exam  BP 122/72 (BP Location: Left Arm, Cuff Size: Normal)   Pulse 80   Ht 5' 3.5" (1.613 m)   Wt 95 lb 6.4 oz (43.3 kg)   SpO2 98%   BMI 16.63 kg/m   GEN: A/Ox3; pleasant , NAD, elderly , thin    HEENT:  [place]/AT,  EACs-clear, TMs-wnl, NOSE-clear drainage , THROAT-clear, no lesions, no postnasal drip or exudate noted.   NECK:  Supple w/ fair ROM; no JVD; normal carotid impulses w/o bruits; no thyromegaly or nodules palpated; no lymphadenopathy.    RESP  Clear  P & A; w/o, wheezes/ rales/ or rhonchi. no accessory muscle use, no dullness to percussion  CARD:  RRR, no m/r/g, no peripheral edema, pulses intact, no cyanosis or clubbing.  GI:   Soft & nt; nml bowel sounds; no organomegaly or masses detected.   Musco: Warm bil, no deformities or joint swelling noted.   Neuro: alert, no focal deficits noted.    Skin: Warm, no lesions or rashes    Lab Results:  CBC  BNP No results found for: BNP  ProBNP No results found for: PROBNP  Imaging:   Assessment & Plan:   BRONCHIECTASIS Flare  Check cxr   Plan  . Patient Instructions  Augmentin 875mg  Twice daily  For 1 week . Take with food.  Mucinex DM Twice daily  As needed  Cough/congestion  Chest xray today .  Follow up with Dr. Halford Collins  As planned in 1 month and As needed    Please contact office for sooner follow up if symptoms do not improve or worsen or seek emergency care       GERD Cont on GERD Tx. And diet       Elizabeth Edison, NP 10/10/2017

## 2017-10-10 NOTE — Patient Instructions (Signed)
Augmentin 875mg  Twice daily  For 1 week . Take with food.  Mucinex DM Twice daily  As needed  Cough/congestion  Chest xray today .  Follow up with Dr. Halford Chessman  As planned in 1 month and As needed    Please contact office for sooner follow up if symptoms do not improve or worsen or seek emergency care

## 2017-10-10 NOTE — Assessment & Plan Note (Signed)
Cont on GERD Tx. And diet

## 2017-10-14 ENCOUNTER — Encounter: Payer: Self-pay | Admitting: Adult Health

## 2017-10-14 NOTE — Telephone Encounter (Signed)
Error

## 2017-10-18 ENCOUNTER — Other Ambulatory Visit: Payer: Self-pay | Admitting: Pulmonary Disease

## 2017-10-21 NOTE — Telephone Encounter (Signed)
I called the patient and asked her to call me. I wanted to know how she was doing and if she was able to get an appointment with [facility]. I did speak to [facility] there to get the patient an appointment with Dr. Claiborne Rigg for 10-18-2017.

## 2017-11-15 DIAGNOSIS — I700 Atherosclerosis of aorta: Secondary | ICD-10-CM

## 2017-11-15 HISTORY — DX: Atherosclerosis of aorta: I70.0

## 2017-11-21 ENCOUNTER — Encounter: Payer: Self-pay | Admitting: Pulmonary Disease

## 2017-11-21 ENCOUNTER — Ambulatory Visit (INDEPENDENT_AMBULATORY_CARE_PROVIDER_SITE_OTHER): Payer: Medicare HMO | Admitting: Pulmonary Disease

## 2017-11-21 DIAGNOSIS — J471 Bronchiectasis with (acute) exacerbation: Secondary | ICD-10-CM

## 2017-11-21 NOTE — Progress Notes (Signed)
[facility]  Chief Complaint  Patient presents with  . Follow-up    Pt has chest tightestness, and left side pain hurts to breath worsen in last 2 months. Pt has SOB with exertion, productive cough- yellow thick mucus. Pt requests flu shot today    Vital signs: BP 118/76 (BP Location: Left Arm, Cuff Size: Normal)   Pulse 70   Ht 5\' 5"  (1.651 m)   Wt 95 lb 6.4 oz (43.3 kg)   SpO2 99%   BMI 15.88 kg/m   History of Present Illness: [name] is a 77 y.o. female former smoker with bronchiectasis, recurrent aspiration pneumonia, and history of cryptogenic organizing pneumonia.  She continues to have cough with green sputum.  She is sometimes getting blood streaks.  She feels more short of breath and fatigued with activity.  She has discomfort intermittently in her chest Lt > Rt.  She has sinus congestion and intermittent sore throat.  She will get hoarse at times.  She was seen by ENT and told she has LPR.  Physical Exam:  General - pleasant, thin Eyes - pupils reactive, wears glasses ENT - no sinus tenderness, no oral exudate, no LAN Cardiac - regular, no murmur Chest - faint b/l crackles Abd - soft, non tender Ext - no edema Skin - no rashes Neuro - normal strength Psych - normal mood  Assessment/Plan:  Bronchiectasis. - her symptoms have progressed - will get sampling of her sputum for AFB and cultures - will arrange for high resolution CT chest   Patient Instructions  High dose flu shot today  Will arrange for CT chest and sampling of your phlegm  Follow up in 4 to 6 weeks with Dr. Beckey Rutter, MD [place] 11/21/2017, 11:51 AM Pager:  [phone]  Flow Sheet  Pulmonary tests: PFT 12/31/09 >> FEV1 1.84(84%), FEV1% 73, TLC 4.94(98%), DLCO 77%, +BD. CT chest 08/17/10 >> mild biapical scarring, basilar peribronchovascular nodularity and mild bronchiectasis PFT 06/21/16 >> FEV1 1.22 (55%),  FEV1% 76, TLC 5.29 (104%), DLCO 43% PFT 12/21/16 >> FEV1 1.49 (69%), FEV1% 78, TLC 5.42 (107%), RV 158%, DLCO 51%  Cardiac tests Echo 07/16/16 >> EF 60 to 65%, grade 2 DD, mild MR, small PFO, PAS 27 mmHg  Events:  Past Medical History: She  has a past medical history of Allergy, Arthritis, BOOP (bronchiolitis obliterans with organizing pneumonia) ([place]), Bronchiectasis, Esophageal dysmotility, Gastroparesis, GERD (gastroesophageal reflux disease), Iron deficiency anemia, Neuropathy, Nutcracker esophagus, Pneumonia, Pulmonary fibrosis ([place]), Raynaud's disease, and Right lower lobe pneumonia (December 2013).  Past Surgical History: She  has a past surgical history that includes Cholecystectomy (2005); Breast lumpectomy; Total abdominal hysterectomy; Lung biopsy; Cataract extraction; Colonoscopy; Upper gastrointestinal endoscopy; Esophageal manometry (N/A, 10/06/2015); and 24 hour ph study (N/A, 10/13/2015).  Family History: Her family history includes Cancer in her maternal grandmother; Heart disease in her father and paternal grandfather; Leukemia in her paternal grandmother; Lung cancer in her paternal uncle; Lymphoma in her mother.  Social History: She  reports that she quit smoking about 49 years ago. Her smoking use included cigarettes. she has never used smokeless tobacco. She reports that she does not drink alcohol or use drugs.  Medications: Allergies as of 11/21/2017      Reactions   Cortisone    unsure   Prednisone    Sulfonamide Derivatives    Unsure of reaction      Medication List  Accurate as of 11/21/17 11:51 AM. Always use your most recent med list.          aspirin EC 81 MG tablet Take 81 mg by mouth daily.   benzonatate 200 MG capsule Commonly known as:  TESSALON Take 1 capsule (200 mg total) by mouth 3 (three) times daily as needed for cough.   dexlansoprazole 60 MG capsule Commonly known as:  DEXILANT Take 1 capsule (60 mg total) by mouth daily.     fexofenadine 180 MG tablet Commonly known as:  ALLEGRA Take 180 mg by mouth daily.   folic acid 1 MG tablet Commonly known as:  FOLVITE Take 1 tablet (1 mg total) by mouth daily.   Gabapentin 300 MG/6ML Soln Take 2 mLs by mouth at bedtime.   montelukast 10 MG tablet Commonly known as:  SINGULAIR TAKE 1 TABLET AT BEDTIME   PREMARIN 0.3 MG tablet Generic drug:  estrogens (conjugated) Take 0.3 mg by mouth daily.   TYLENOL EXTRA STRENGTH PO Take 350 mg by mouth 3 (three) times daily as needed.   VIACTIV MULTI-VITAMIN Chew Chew 1 tablet by mouth 4 (four) times daily.   VIACTIV PO Take by mouth.   VITAMIN B1 PO Take by mouth.

## 2017-11-21 NOTE — Patient Instructions (Signed)
High dose flu shot today  Will arrange for CT chest and sampling of your phlegm  Follow up in 4 to 6 weeks with Dr. Halford Chessman

## 2017-11-22 ENCOUNTER — Ambulatory Visit: Payer: Self-pay | Admitting: Gastroenterology

## 2017-11-26 ENCOUNTER — Telehealth: Payer: Self-pay | Admitting: Internal Medicine

## 2017-11-26 NOTE — Telephone Encounter (Signed)
Patient called with diarrhea through the night and has noticed blood as well. The stool is described as brown clumps and yellow. The blood is separate and bright red and a protruding hemorrhoid. Mild abdominal cramping. Colonoscopy 2016 with polyps but not diverticulosis or internal hemorrhoids. She was planning on going to [facility] urgent care center this am for evaluation. I agreed.

## 2017-11-28 ENCOUNTER — Ambulatory Visit (INDEPENDENT_AMBULATORY_CARE_PROVIDER_SITE_OTHER)
Admission: RE | Admit: 2017-11-28 | Discharge: 2017-11-28 | Disposition: A | Discharge status: Home / Self Care | Payer: Medicare HMO | Source: Ambulatory Visit | Attending: Pulmonary Disease | Admitting: Pulmonary Disease

## 2017-11-28 ENCOUNTER — Inpatient Hospital Stay: Admission: RE | Admit: 2017-11-28 | Payer: Medicare HMO | Source: Ambulatory Visit

## 2017-11-28 DIAGNOSIS — J471 Bronchiectasis with (acute) exacerbation: Secondary | ICD-10-CM | POA: Diagnosis not present

## 2017-11-28 DIAGNOSIS — H524 Presbyopia: Secondary | ICD-10-CM | POA: Diagnosis not present

## 2017-11-28 DIAGNOSIS — H5203 Hypermetropia, bilateral: Secondary | ICD-10-CM | POA: Diagnosis not present

## 2017-11-28 DIAGNOSIS — H52209 Unspecified astigmatism, unspecified eye: Secondary | ICD-10-CM | POA: Diagnosis not present

## 2017-11-28 DIAGNOSIS — H04123 Dry eye syndrome of bilateral lacrimal glands: Secondary | ICD-10-CM | POA: Diagnosis not present

## 2017-11-28 DIAGNOSIS — J84112 Idiopathic pulmonary fibrosis: Secondary | ICD-10-CM | POA: Diagnosis not present

## 2017-11-28 NOTE — Telephone Encounter (Signed)
Thanks Vernice Jefferson can you please contact this patient to see how she is doing. If her symptoms persist let me know. May consider stool studies for infection if diarrhea is new and persisting

## 2017-11-29 NOTE — Telephone Encounter (Signed)
Left message for patient to call back  

## 2017-12-01 ENCOUNTER — Telehealth: Payer: Self-pay | Admitting: Pulmonary Disease

## 2017-12-01 NOTE — Telephone Encounter (Signed)
HRCT chest 11/28/17 >> mild centrilobular emphysema, peribronchovascular nodularity, mild BTX all lobes, 9 mm nodule RLL, 1.6 cm LLL nodular consolidation   Left message explaining that CT chest findings are consistent with concern for MAI.  Will need to await sputum culture results.  If culture results are not diagnostic, will then need to arrange for bronchoscopy.  Advised patient to call back with questions.

## 2017-12-02 ENCOUNTER — Other Ambulatory Visit: Payer: Medicare HMO

## 2017-12-02 ENCOUNTER — Telehealth: Payer: Self-pay | Admitting: Pulmonary Disease

## 2017-12-02 NOTE — Telephone Encounter (Signed)
Note    HRCT chest 11/28/17 >> mild centrilobular emphysema, peribronchovascular nodularity, mild BTX all lobes, 9 mm nodule RLL, 1.6 cm LLL nodular consolidation   Left message explaining that CT chest findings are consistent with concern for MAI.  Will need to await sputum culture results.  If culture results are not diagnostic, will then need to arrange for bronchoscopy.  Advised patient to call back with questions.     Spoke with pt, advised message from VS. She states she has not done the sputum culture yet but she is working on it. She hasn't had any sputum lately but feels she will soon and will drop it of at the lab. Nothing further is needed.

## 2017-12-02 NOTE — Telephone Encounter (Signed)
Called patient and left another vm, just checking on her to see how she is feeling. Asked that she call back to office to let us know, hopefully all her symptoms have resolved.

## 2017-12-05 ENCOUNTER — Telehealth: Payer: Self-pay

## 2017-12-05 ENCOUNTER — Other Ambulatory Visit: Payer: Self-pay

## 2017-12-05 ENCOUNTER — Other Ambulatory Visit: Payer: Medicare HMO

## 2017-12-05 ENCOUNTER — Telehealth: Payer: Self-pay | Admitting: Pulmonary Disease

## 2017-12-05 DIAGNOSIS — R197 Diarrhea, unspecified: Secondary | ICD-10-CM

## 2017-12-05 DIAGNOSIS — J471 Bronchiectasis with (acute) exacerbation: Secondary | ICD-10-CM

## 2017-12-05 NOTE — Telephone Encounter (Signed)
Ordered GI pathogen and left patient a voice message as we discussed earlier today.

## 2017-12-05 NOTE — Telephone Encounter (Signed)
Will close encounter, as nothing further is needed.  

## 2017-12-05 NOTE — Telephone Encounter (Signed)
Thanks Almyra Free. Yes if diarrhea is persisting, please order GI pathogen panel. I will await results. thanks

## 2017-12-05 NOTE — Telephone Encounter (Signed)
Spoke to patient she is still having watery diarrhea (no blood), is able to eat and drink. Let her know that will most likely have her do stool studies, patient was okay with that plan. Please advise.

## 2017-12-06 ENCOUNTER — Encounter: Payer: Self-pay | Admitting: Adult Health

## 2017-12-06 ENCOUNTER — Ambulatory Visit (INDEPENDENT_AMBULATORY_CARE_PROVIDER_SITE_OTHER): Payer: Medicare HMO | Admitting: Adult Health

## 2017-12-06 ENCOUNTER — Ambulatory Visit: Payer: Medicare HMO | Admitting: Gastroenterology

## 2017-12-06 DIAGNOSIS — R05 Cough: Secondary | ICD-10-CM | POA: Diagnosis not present

## 2017-12-06 DIAGNOSIS — R9389 Abnormal findings on diagnostic imaging of other specified body structures: Secondary | ICD-10-CM | POA: Diagnosis not present

## 2017-12-06 DIAGNOSIS — J479 Bronchiectasis, uncomplicated: Secondary | ICD-10-CM | POA: Diagnosis not present

## 2017-12-06 MED ORDER — FLUTTER DEVI: 0 refills | Status: DC

## 2017-12-06 MED ORDER — AMOXICILLIN-POT CLAVULANATE 875-125 MG PO TABS: 1.0000 | ORAL_TABLET | Freq: Two times a day (BID) | ORAL | 0 refills | Status: AC

## 2017-12-06 NOTE — Progress Notes (Signed)
I have reviewed and agree with assessment/plan.  Chesley Mires, MD [facility] 12/06/2017, 1:43 PM Pager:  [phone]

## 2017-12-06 NOTE — Patient Instructions (Addendum)
Augmentin 875mg  Twice daily  For 1 week . Take with food.  Mucinex DM Twice daily  As needed  Cough/congestion  May use Flutter Valve Three times a day  .  Fluids and rest .  Follow up with Dr. Halford Chessman  4-6 weeks and As needed   Please contact office for sooner follow up if symptoms do not improve or worsen or seek emergency care

## 2017-12-06 NOTE — Addendum Note (Signed)
Addended by: Parke Poisson E on: 12/06/2017 12:42 PM   Modules accepted: Orders

## 2017-12-06 NOTE — Progress Notes (Signed)
Patient seen in the office today and instructed on use of Flutter Valve.  Patient expressed understanding and demonstrated technique. Collyns Mcquigg, [facility] 12/06/17

## 2017-12-06 NOTE — Progress Notes (Signed)
_0  ID: [name], female    DOB: 20-Aug-1941, 77 y.o.   MRN: [id number]  Chief Complaint  Patient presents with  . Acute Visit    Cough     Referring provider: [name], MD  HPI: 77 yo female former smoker followed for bronchiectasis and recurrent aspiration . Prior hx of cryptogenic organizing pneumonia   TEST PFT 12/31/09 >> FEV1 1.84(84%), FEV1% 73, TLC 4.94(98%), DLCO 77%, +BD. CT chest 08/17/10 >> mild biapical scarring, basilar peribronchovascular nodularity and mild bronchiectasis. Echo 06/04/15 >> EF 55%, probable PFO  12/06/2017 Acute OV : Bronchiectasis  Patient presents for an acute office visit.  She complains of 2 weeks of increased cough, congestion , wheezing , increased dyspnea, chills , achy, decreased appetite. And weak.  No fever, hemoptysis , n/v/d, orthopnea or edema.  High-resolution CT chest done on November 28, 2017 showed multi lobar nodularity, peribronchial thickening and mild bronchiectasis progressive since 2011.  Most indicative of MAI.  New 8 mm right lower lobe nodule.  Increased left lower lobe subpleural consolidation.  Recommendations for a follow-up CT chest in 3 months.. She was recommended sputum cx and Sputum AFB . This was collected yesterday and results pending .    Allergies  Allergen Reactions  . Cortisone     unsure  . Prednisone   . Sulfonamide Derivatives     Unsure of reaction    Immunization History  Administered Date(s) Administered  . Influenza Split 11/06/2011, 11/06/2012  . Influenza Whole 10/29/2009, 08/15/2010  . Influenza, High Dose Seasonal PF 12/09/2016  . Influenza,inj,Quad PF,6+ Mos 10/15/2014, 11/22/2014, 11/19/2015  . Pneumococcal Conjugate-13 09/17/2013  . Pneumococcal Polysaccharide-23 10/29/2004, 01/08/2015    Past Medical History:  Diagnosis Date  . Allergy   . Arthritis   . BOOP (bronchiolitis obliterans with organizing pneumonia) ([place])   . Bronchiectasis   . Esophageal dysmotility     . Gastroparesis   . GERD (gastroesophageal reflux disease)    severe  . Iron deficiency anemia   . Neuropathy   . Nutcracker esophagus   . Pneumonia   . Pulmonary fibrosis ([place])   . Raynaud's disease   . Right lower lobe pneumonia December 2013    Tobacco History: Social History   Tobacco Use  Smoking Status Former Smoker  . Types: Cigarettes  . Last attempt to quit: 11/15/1968  . Years since quitting: 49.0  Smokeless Tobacco Never Used  Tobacco Comment   3 cigs a day   Counseling given: Not Answered Comment: 3 cigs a day   Outpatient Encounter Medications as of 12/06/2017  Medication Sig  . Acetaminophen (TYLENOL EXTRA STRENGTH PO) Take 350 mg by mouth 3 (three) times daily as needed.   . benzonatate (TESSALON) 200 MG capsule Take 1 capsule (200 mg total) by mouth 3 (three) times daily as needed for cough.  . Calcium-Vitamin D-Vitamin K (VIACTIV PO) Take by mouth.  . fexofenadine (ALLEGRA) 180 MG tablet Take 180 mg by mouth daily.  . folic acid (FOLVITE) 1 MG tablet Take 1 tablet (1 mg total) by mouth daily.  . Gabapentin 300 MG/6ML SOLN Take 2 mLs by mouth at bedtime. (Patient taking differently: Take 2 mLs by mouth at bedtime. Taking as needed)  . montelukast (SINGULAIR) 10 MG tablet TAKE 1 TABLET AT BEDTIME  . Multiple Vitamins-Calcium (VIACTIV MULTI-VITAMIN) CHEW Chew 1 tablet by mouth 4 (four) times daily.    [name] PREMARIN 0.3 MG tablet Take 0.3 mg by mouth daily.   [name]  Thiamine Mononitrate (VITAMIN B1 PO) Take by mouth.  . [DISCONTINUED] aspirin EC 81 MG tablet Take 81 mg by mouth daily.  [name] amoxicillin-clavulanate (AUGMENTIN) 875-125 MG tablet Take 1 tablet by mouth 2 (two) times daily for 7 days.  [name] dexlansoprazole (DEXILANT) 60 MG capsule Take 1 capsule (60 mg total) by mouth daily.   No facility-administered encounter medications on file as of 12/06/2017.      Review of Systems  Constitutional:   No  weight loss, night sweats,  Fevers,  +chills, fatigue, or   lassitude.  HEENT:   No headaches,  Difficulty swallowing,  Tooth/dental problems, or  Sore throat,                No sneezing, itching, ear ache, nasal congestion, post nasal drip,   CV:  No chest pain,  Orthopnea, PND, swelling in lower extremities, anasarca, dizziness, palpitations, syncope.   GI  No heartburn, indigestion, abdominal pain, nausea, vomiting, diarrhea, change in bowel habits, loss of appetite, bloody stools.   Resp: No chest wall deformity  Skin: no rash or lesions.  GU: no dysuria, change in color of urine, no urgency or frequency.  No flank pain, no hematuria   MS:  No joint pain or swelling.  No decreased range of motion.  No back pain.    Physical Exam  BP 128/68 (BP Location: Left Arm, Cuff Size: Normal)   Pulse 82   Temp 98.2 F (36.8 C) (Oral)   Ht _0  (1.651 m)   Wt 95 lb 6.4 oz (43.3 kg)   SpO2 97%   BMI 15.88 kg/m   GEN: A/Ox3; pleasant , NAD, thin and frail    HEENT:  [place]/AT,  EACs-clear, TMs-wnl, NOSE-clear, THROAT-clear, no lesions, no postnasal drip or exudate noted.   NECK:  Supple w/ fair ROM; no JVD; normal carotid impulses w/o bruits; no thyromegaly or nodules palpated; no lymphadenopathy.    RESP  Scattered rhonchi , no accessory muscle use, no dullness to percussion  CARD:  RRR, no m/r/g, no peripheral edema, pulses intact, no cyanosis or clubbing.  GI:   Soft & nt; nml bowel sounds; no organomegaly or masses detected.   Musco: Warm bil, no deformities or joint swelling noted.   Neuro: alert, no focal deficits noted.    Skin: Warm, no lesions or rashes    Lab Results:  CBC   BMET  BNP No results found for: BNP  ProBNP No results found for: PROBNP  Imaging: Ct Chest High Resolution  Result Date: 11/28/2017 CLINICAL DATA:  Pulmonary fibrosis, bronchiectasis, occasional cough. EXAM: CT CHEST WITHOUT CONTRAST TECHNIQUE: Multidetector CT imaging of the chest was performed following the standard protocol without  intravenous contrast. High resolution imaging of the lungs, as well as inspiratory and expiratory imaging, was performed. COMPARISON:  08/17/2010. FINDINGS: Cardiovascular: Atherosclerotic calcification of the arterial vasculature. Heart size normal. No pericardial effusion. Mediastinum/Nodes: No pathologically enlarged mediastinal or axillary lymph nodes. Hilar regions are difficult to evaluate without IV contrast. Esophagus is grossly unremarkable. Lungs/Pleura: Mild centrilobular emphysema. Progressive peribronchovascular nodularity, peribronchial thickening and mild bronchiectasis affecting all lobes of both lungs. A few discrete pulmonary nodules measure up to 8 mm (7 x 9 mm) in the peripheral right lower lobe, new. Subpleural area of nodular consolidation in the lateral left lower lobe has enlarged, measuring 1.1 x 1.6 cm, previously 8 x 8 mm. No pleural fluid. Airway is unremarkable. No air trapping. Upper Abdomen: Visualized portions of the liver, adrenal  glands, kidneys, spleen, pancreas and stomach are grossly unremarkable. Musculoskeletal: Degenerative changes in the spine. No worrisome lytic or sclerotic lesions. IMPRESSION: 1. Multilobar peribronchovascular nodularity, peribronchial thickening and mild bronchiectasis, progressive from 08/17/2010 and most indicative of chronic mycobacterium avium complex. 2. New discrete pulmonary nodules measure up to 8 mm in the right lower lobe. Enlarging area of subpleural consolidation in the left lower lobe. Findings are also likely due to chronic mycobacterium avium complex and although malignancy is not favored, follow-up CT chest without contrast in 3 months is recommended as adenocarcinoma cannot be definitively excluded. 3.  Aortic atherosclerosis (ICD10-170.0). 4.  Emphysema (ICD10-J43.9). Electronically Signed   By: Lorin Picket M.D.   On: 11/28/2017 14:41    Reviewed independently  Assessment & Plan:   BRONCHIECTASIS Exacerbation  Recent CT chest  w/ possible MAI , sputum cx and AFB pending   Plan  Patient Instructions  Augmentin 828m Twice daily  For 1 week . Take with food.  Mucinex DM Twice daily  As needed  Cough/congestion  May use Flutter Valve Three times a day  .  Fluids and rest .  Follow up with Dr. SHalford Chessman 4-6 weeks and As needed   Please contact office for sooner follow up if symptoms do not improve or worsen or seek emergency care        Abnormal CT scan Abnormal CT chest w/ bronchiectasis , possible MAI . Increased LLL consolidation  1.6   And new RLL nodule 8 mm  Cultures pending , if not dx will need to consider FOB . And serial follow up of CT chest   Plan  Patient Instructions  Augmentin 8721mTwice daily  For 1 week . Take with food.  Mucinex DM Twice daily  As needed  Cough/congestion  May use Flutter Valve Three times a day  .  Fluids and rest .  Follow up with Dr. SoHalford Chessman4-6 weeks and As needed   Please contact office for sooner follow up if symptoms do not improve or worsen or seek emergency care           TaRexene EdisonNP 12/06/2017

## 2017-12-06 NOTE — Assessment & Plan Note (Signed)
Abnormal CT chest w/ bronchiectasis , possible MAI . Increased LLL consolidation  1.6   And new RLL nodule 8 mm  Cultures pending , if not dx will need to consider FOB . And serial follow up of CT chest   Plan  Patient Instructions  Augmentin 875mg  Twice daily  For 1 week . Take with food.  Mucinex DM Twice daily  As needed  Cough/congestion  May use Flutter Valve Three times a day  .  Fluids and rest .  Follow up with Dr. Halford Chessman  4-6 weeks and As needed   Please contact office for sooner follow up if symptoms do not improve or worsen or seek emergency care

## 2017-12-06 NOTE — Assessment & Plan Note (Signed)
Exacerbation  Recent CT chest w/ possible MAI , sputum cx and AFB pending   Plan  Patient Instructions  Augmentin 875mg  Twice daily  For 1 week . Take with food.  Mucinex DM Twice daily  As needed  Cough/congestion  May use Flutter Valve Three times a day  .  Fluids and rest .  Follow up with Dr. Halford Chessman  4-6 weeks and As needed   Please contact office for sooner follow up if symptoms do not improve or worsen or seek emergency care

## 2017-12-08 ENCOUNTER — Other Ambulatory Visit: Payer: Medicare HMO

## 2017-12-08 ENCOUNTER — Ambulatory Visit (INDEPENDENT_AMBULATORY_CARE_PROVIDER_SITE_OTHER): Payer: Medicare HMO | Admitting: Gastroenterology

## 2017-12-08 ENCOUNTER — Encounter: Payer: Self-pay | Admitting: Gastroenterology

## 2017-12-08 VITALS — BP 116/60 | HR 84 | Ht 63.5 in | Wt 96.0 lb

## 2017-12-08 DIAGNOSIS — R490 Dysphonia: Secondary | ICD-10-CM

## 2017-12-08 DIAGNOSIS — J029 Acute pharyngitis, unspecified: Secondary | ICD-10-CM | POA: Diagnosis not present

## 2017-12-08 DIAGNOSIS — R197 Diarrhea, unspecified: Secondary | ICD-10-CM | POA: Diagnosis not present

## 2017-12-08 DIAGNOSIS — K219 Gastro-esophageal reflux disease without esophagitis: Secondary | ICD-10-CM

## 2017-12-08 DIAGNOSIS — K224 Dyskinesia of esophagus: Secondary | ICD-10-CM | POA: Diagnosis not present

## 2017-12-08 LAB — RESPIRATORY CULTURE OR RESPIRATORY AND SPUTUM CULTURE
MICRO NUMBER:: 90085518
RESULT: NORMAL
SPECIMEN QUALITY: ADEQUATE

## 2017-12-08 MED ORDER — DESIPRAMINE HCL 25 MG PO TABS: 25.0000 mg | ORAL_TABLET | Freq: Every day | ORAL | 0 refills | Status: DC

## 2017-12-08 NOTE — Progress Notes (Signed)
12/08/2017 [name] [id number] 01/03/1941   HISTORY OF PRESENT ILLNESS: This is a 77 year old female who is known to Dr. Havery Moros.  She has a history of reflux/dysphagia related to nutcracker esophagus and iron deficiency anemia for which she has been evaluated and following here for the past 2 years or so.  She has had extensive GI evaluation in regards to these issues (see Dr. Doyne Keel note from 09/14/2016 for outline of evaluation).  She is here today again with the same complaints.  Complains of hoarseness, sore throat, burning in her esophagus.  She is on Dexilant 60 mg daily and takes over-the-counter Nexium at nighttime.  She had been given diltiazem in the past but she did not tolerate that well.    Another reason for the appointment was to discuss some diarrhea that she had 3 weeks ago.  Says that she had sudden onset of diarrhea that lasted an entire night.  Diarrhea resolved after that and stools have been back to normal.  She wanted to know what our thoughts were on that situation.  Apparently stool studies were ordered, GI pathogen panel, and she just turned those samples in today.   Past Medical History:  Diagnosis Date  . Allergy   . Arthritis   . BOOP (bronchiolitis obliterans with organizing pneumonia) ([place])   . Bronchiectasis   . Esophageal dysmotility   . Gastroparesis   . GERD (gastroesophageal reflux disease)    severe  . Iron deficiency anemia   . Neuropathy   . Nutcracker esophagus   . Pneumonia   . Pulmonary fibrosis ([place])   . Raynaud's disease   . Right lower lobe pneumonia December 2013   Past Surgical History:  Procedure Laterality Date  . [place] STUDY N/A 10/13/2015   Procedure: [place] STUDY;  Surgeon: Gatha Mayer, MD;  Location: [facility];  Service: Endoscopy;  Laterality: N/A;  . BREAST LUMPECTOMY     left, x 2  . CATARACT EXTRACTION     both eyes  . CHOLECYSTECTOMY  2005  . COLONOSCOPY    . ESOPHAGEAL MANOMETRY N/A  10/06/2015   Procedure: ESOPHAGEAL MANOMETRY (EM);  Surgeon: Manus Gunning, MD;  Location: [facility];  Service: Gastroenterology;  Laterality: N/A;  . LUNG BIOPSY     vats right 2003  . TOTAL ABDOMINAL HYSTERECTOMY    . UPPER GASTROINTESTINAL ENDOSCOPY      reports that she quit smoking about 49 years ago. Her smoking use included cigarettes. she has never used smokeless tobacco. She reports that she does not drink alcohol or use drugs. family history includes Cancer in her maternal grandmother; Heart disease in her father and paternal grandfather; Leukemia in her paternal grandmother; Lung cancer in her paternal uncle; Lymphoma in her mother. Allergies  Allergen Reactions  . Cortisone     unsure  . Prednisone   . Sulfonamide Derivatives     Unsure of reaction      Outpatient Encounter Medications as of 12/08/2017  Medication Sig  . Acetaminophen (TYLENOL EXTRA STRENGTH PO) Take 350 mg by mouth 3 (three) times daily as needed.   [name] amoxicillin-clavulanate (AUGMENTIN) 875-125 MG tablet Take 1 tablet by mouth 2 (two) times daily for 7 days.  . benzonatate (TESSALON) 200 MG capsule Take 1 capsule (200 mg total) by mouth 3 (three) times daily as needed for cough.  . Calcium-Vitamin D-Vitamin K (VIACTIV PO) Take by mouth.  . fexofenadine (ALLEGRA) 180 MG tablet Take  180 mg by mouth daily.  . folic acid (FOLVITE) 1 MG tablet Take 1 tablet (1 mg total) by mouth daily.  . Gabapentin 300 MG/6ML SOLN Take 2 mLs by mouth at bedtime. (Patient taking differently: Take 2 mLs by mouth at bedtime. Taking as needed)  . montelukast (SINGULAIR) 10 MG tablet TAKE 1 TABLET AT BEDTIME  . Multiple Vitamins-Calcium (VIACTIV MULTI-VITAMIN) CHEW Chew 1 tablet by mouth 4 (four) times daily.    [name] PREMARIN 0.3 MG tablet Take 0.3 mg by mouth daily.   [name] Respiratory Therapy Supplies (FLUTTER) DEVI Use as directed.  . Thiamine Mononitrate (VITAMIN B1 PO) Take by mouth.  . dexlansoprazole (DEXILANT) 60 MG  capsule Take 1 capsule (60 mg total) by mouth daily.   No facility-administered encounter medications on file as of 12/08/2017.      REVIEW OF SYSTEMS  : All other systems reviewed and negative except where noted in the History of Present Illness.   PHYSICAL EXAM: BP 116/60   Pulse 84   Ht 5' 3.5" (1.613 m)   Wt 96 lb (43.5 kg)   BMI 16.74 kg/m  General: Thin white female in no acute distress Head: Normocephalic and atraumatic Eyes:  Sclerae anicteric, conjunctiva pink. Ears: Normal auditory acuity Lungs: Clear throughout to auscultation; no increased WOB. Heart: Regular rate and rhythm; no M/R/G. Abdomen: Soft, non-distended.  BS present.  Non-tender.  Ventral hernia noted. Musculoskeletal: Symmetrical with no gross deformities  Skin: No lesions on visible extremities Extremities: No edema  Neurological: Alert oriented x 4, grossly non-focal Psychological:  Alert and cooperative. Normal mood and affect  ASSESSMENT AND PLAN: -77 year old female with history of GERD/dysphagia:  Extensive GI evaluation over the years.  Has nutcracker esophagus and I suspect possible component of functional dyspepsia.  Here with recurrent complaints of dysphagia, burning epigastric abdominal pain, hoarseness, sore throat.  It has been difficult to get her on track with her medications/PPI's in the past.  She is not very tolerate to other medications including diltiazem.  Is currently on Dexilant 60 mg daily and OTC Nexium every night for her GERD.  Discussed with Dr. Havery Moros previously.  Will try desipramine 25 mg daily.  She will call our office back in 3 weeks and let us know if she is tolerating the medication at which time we could increase it to 50 mg daily.  She has an appointment to follow-up with Dr. Havery Moros in about 5 or 6 weeks, which she will keep as well.  I have also recommended that she see an ENT physician for her sore throat and hoarseness.  She will discuss with her PCP regarding a  referral/recommendation. -Diarrhea:  Only occurred one night 3 weeks ago.  Stools back to normal.  Likely was an infectious gastroenteritis.  She turned in a stool study today but I suspect that it will be negative.  Observe.    CC:  [name], MD

## 2017-12-08 NOTE — Progress Notes (Signed)
Agree with assessment and plan as outlined.  Will see how she does with trial of TCA and continue Dexilant daily. ENT evaluation also reasonable. I will see her in follow up for reassessment.

## 2017-12-08 NOTE — Patient Instructions (Addendum)
If you are age 75 or older, your body mass index should be between 23-30. Your Body mass index is 16.74 kg/m. If this is out of the aforementioned range listed, please consider follow up with your Primary Care Provider.  If you are age 46 or younger, your body mass index should be between 19-25. Your Body mass index is 16.74 kg/m. If this is out of the aformentioned range listed, please consider follow up with your Primary Care Provider.   We have sent the following medications to your pharmacy for you to pick up at your convenience: Desipramine 25 mg  Call back in three weeks with an update.  Ask for Patty.  See ENT.  Follow up with Dr. Havery Moros on January 17, 2018 at 2:45 pm.  Thank you for choosing me and [facility].   Alonza Bogus, PA

## 2017-12-09 LAB — GASTROINTESTINAL PATHOGEN PANEL PCR
C. DIFFICILE TOX A/B, PCR: NOT DETECTED
CRYPTOSPORIDIUM, PCR: NOT DETECTED
Campylobacter, PCR: NOT DETECTED
E COLI (ETEC) LT/ST, PCR: NOT DETECTED
E coli (STEC) stx1/stx2, PCR: NOT DETECTED
E coli 0157, PCR: NOT DETECTED
Giardia lamblia, PCR: NOT DETECTED
NOROVIRUS, PCR: NOT DETECTED
Rotavirus A, PCR: NOT DETECTED
Salmonella, PCR: NOT DETECTED
Shigella, PCR: NOT DETECTED

## 2017-12-12 ENCOUNTER — Telehealth: Payer: Self-pay | Admitting: Gastroenterology

## 2017-12-12 DIAGNOSIS — Z681 Body mass index (BMI) 19 or less, adult: Secondary | ICD-10-CM | POA: Diagnosis not present

## 2017-12-12 DIAGNOSIS — M7989 Other specified soft tissue disorders: Secondary | ICD-10-CM | POA: Diagnosis not present

## 2017-12-12 DIAGNOSIS — M2550 Pain in unspecified joint: Secondary | ICD-10-CM | POA: Diagnosis not present

## 2017-12-12 DIAGNOSIS — M1811 Unilateral primary osteoarthritis of first carpometacarpal joint, right hand: Secondary | ICD-10-CM | POA: Diagnosis not present

## 2017-12-14 DIAGNOSIS — D649 Anemia, unspecified: Secondary | ICD-10-CM | POA: Diagnosis not present

## 2017-12-23 ENCOUNTER — Ambulatory Visit: Payer: Medicare HMO | Admitting: Pulmonary Disease

## 2017-12-23 ENCOUNTER — Ambulatory Visit: Payer: Medicare HMO | Admitting: Adult Health

## 2017-12-26 ENCOUNTER — Ambulatory Visit (INDEPENDENT_AMBULATORY_CARE_PROVIDER_SITE_OTHER): Payer: Medicare HMO | Admitting: Adult Health

## 2017-12-26 ENCOUNTER — Encounter: Payer: Self-pay | Admitting: Adult Health

## 2017-12-26 VITALS — BP 118/74 | HR 67 | Ht 65.0 in | Wt 93.2 lb

## 2017-12-26 DIAGNOSIS — K219 Gastro-esophageal reflux disease without esophagitis: Secondary | ICD-10-CM

## 2017-12-26 DIAGNOSIS — J479 Bronchiectasis, uncomplicated: Secondary | ICD-10-CM

## 2017-12-26 DIAGNOSIS — R9389 Abnormal findings on diagnostic imaging of other specified body structures: Secondary | ICD-10-CM | POA: Diagnosis not present

## 2017-12-26 DIAGNOSIS — R918 Other nonspecific abnormal finding of lung field: Secondary | ICD-10-CM

## 2017-12-26 DIAGNOSIS — J189 Pneumonia, unspecified organism: Secondary | ICD-10-CM

## 2017-12-26 NOTE — Assessment & Plan Note (Signed)
Cont on GERD diet and rx

## 2017-12-26 NOTE — Patient Instructions (Signed)
Mucinex DM Twice daily  As needed  Cough/congestion  May use Flutter Valve Three times a day.  Follow up with Dr. Halford Chessman 6 weeks and As needed   Please contact office for sooner follow up if symptoms do not improve or worsen or seek emergency care

## 2017-12-26 NOTE — Assessment & Plan Note (Signed)
Recurrent exacerbation with increased symptom load.  CT chest with mild progression since 2011 symptoms suspicious for MAI.  Sputum culture and AFB have been negative.  Case discussed with Dr. Halford Chessman .  Patient will be set up for a bronchoscopy for BAL sampling.   Plan  Patient Instructions  Mucinex DM Twice daily  As needed  Cough/congestion  May use Flutter Valve Three times a day.  Follow up with Dr. Halford Chessman 6 weeks and As needed   Please contact office for sooner follow up if symptoms do not improve or worsen or seek emergency care

## 2017-12-26 NOTE — Progress Notes (Signed)
_0  ID: [name], female    DOB: July 17, 1941, 77 y.o.   MRN: [id number]  Chief Complaint  Patient presents with  . Follow-up    Bronciectasis     Referring provider: [name], MD  HPI: 77yo female former smoker followed for bronchiectasis and recurrent aspiration . Prior hx of cryptogenic organizing pneumonia   TEST PFT 12/31/09 >> FEV1 1.84(84%), FEV1% 73, TLC 4.94(98%), DLCO 77%, +BD. CT chest 08/17/10 >> mild biapical scarring, basilar peribronchovascular nodularity and mild bronchiectasis. Echo 06/04/15 >> EF 55%, probable PFO   12/26/2017 Follow up ; Bronchiectasis a Patient returns for a 3-week follow-up.  She was seen last visit for an acute bronchiectatic exacerbation. Recent high resolution CT chest on November 28, 2017 showed multi lobar nodularity, peribronchial thickening and bronchiectasis progression since 2011.  Suspicious for MAI.  There was a new 8 mm right lower lobe nodule.  And increased left lower lobe subpleural consolidation.  A sputum culture and AFB sputum were sent that have been negative and no growth to date.  Patient was treated with Augmentin for 1 week last visit.  She says she did improve.  However does feel like the symptoms are slowly coming back.  She continues to use flutter valve and Mucinex. She denies any hemoptysis weight loss chest pain orthopnea.  She is active.  Exercises on a daily basis.  Weight has been stable.  She denies any nausea vomiting or diarrhea.     Allergies  Allergen Reactions  . Cortisone     unsure  . Prednisone   . Sulfonamide Derivatives     Unsure of reaction    Immunization History  Administered Date(s) Administered  . Influenza Split 11/06/2011, 11/06/2012  . Influenza Whole 10/29/2009, 08/15/2010  . Influenza, High Dose Seasonal PF 12/09/2016  . Influenza,inj,Quad PF,6+ Mos 10/15/2014, 11/22/2014, 11/19/2015  . Pneumococcal Conjugate-13 09/17/2013  . Pneumococcal Polysaccharide-23  10/29/2004, 01/08/2015    Past Medical History:  Diagnosis Date  . Allergy   . Arthritis   . BOOP (bronchiolitis obliterans with organizing pneumonia) ([place])   . Bronchiectasis   . Esophageal dysmotility   . Gastroparesis   . GERD (gastroesophageal reflux disease)    severe  . Iron deficiency anemia   . Neuropathy   . Nutcracker esophagus   . Pneumonia   . Pulmonary fibrosis ([place])   . Raynaud's disease   . Right lower lobe pneumonia December 2013    Tobacco History: Social History   Tobacco Use  Smoking Status Former Smoker  . Types: Cigarettes  . Last attempt to quit: 11/15/1968  . Years since quitting: 49.1  Smokeless Tobacco Never Used  Tobacco Comment   3 cigs a day   Counseling given: Not Answered Comment: 3 cigs a day   Outpatient Encounter Medications as of 12/26/2017  Medication Sig  . Acetaminophen (TYLENOL EXTRA STRENGTH PO) Take 350 mg by mouth 3 (three) times daily as needed.   . benzonatate (TESSALON) 200 MG capsule Take 1 capsule (200 mg total) by mouth 3 (three) times daily as needed for cough.  . Calcium-Vitamin D-Vitamin K (VIACTIV PO) Take by mouth.  . fexofenadine (ALLEGRA) 180 MG tablet Take 180 mg by mouth daily.  . Gabapentin 300 MG/6ML SOLN Take 2 mLs by mouth at bedtime. (Patient taking differently: Take 2 mLs by mouth at bedtime. Taking as needed)  . montelukast (SINGULAIR) 10 MG tablet TAKE 1 TABLET AT BEDTIME  . Multiple Vitamins-Calcium (VIACTIV MULTI-VITAMIN) CHEW Chew 1 tablet  by mouth 4 (four) times daily.    [name] PREMARIN 0.3 MG tablet Take 0.3 mg by mouth daily.   [name] Respiratory Therapy Supplies (FLUTTER) DEVI Use as directed.  . Thiamine Mononitrate (VITAMIN B1 PO) Take by mouth.  . [DISCONTINUED] desipramine (NORPRAMIN) 25 MG tablet Take 1 tablet (25 mg total) by mouth daily.  [name] dexlansoprazole (DEXILANT) 60 MG capsule Take 1 capsule (60 mg total) by mouth daily.  . folic acid (FOLVITE) 1 MG tablet Take 1 tablet (1 mg total) by mouth  daily. (Patient not taking: Reported on 12/26/2017)   No facility-administered encounter medications on file as of 12/26/2017.      Review of Systems  Constitutional:   No  weight loss, night sweats,  Fevers, chills, + fatigue, or  lassitude.  HEENT:   No headaches,  Difficulty swallowing,  Tooth/dental problems, or  Sore throat,                No sneezing, itching, ear ache, nasal congestion, post nasal drip,   CV:  No chest pain,  Orthopnea, PND, swelling in lower extremities, anasarca, dizziness, palpitations, syncope.   GI  No heartburn, indigestion, abdominal pain, nausea, vomiting, diarrhea, change in bowel habits, loss of appetite, bloody stools.   Resp:   No chest wall deformity  Skin: no rash or lesions.  GU: no dysuria, change in color of urine, no urgency or frequency.  No flank pain, no hematuria   MS:  No joint pain or swelling.  No decreased range of motion.  No back pain.    Physical Exam  BP 118/74 (BP Location: Left Arm, Cuff Size: Normal)   Pulse 67   Ht _0  (1.651 m)   Wt 93 lb 3.2 oz (42.3 kg)   SpO2 94%   BMI 15.51 kg/m   GEN: A/Ox3; pleasant , NAD, thin and frail   HEENT:  [name]/AT,  EACs-clear, TMs-wnl, NOSE-clear, THROAT-clear, no lesions, no postnasal drip or exudate noted.   NECK:  Supple w/ fair ROM; no JVD; normal carotid impulses w/o bruits; no thyromegaly or nodules palpated; no lymphadenopathy.    RESP few trace rhonchi . no accessory muscle use, no dullness to percussion  CARD:  RRR, no m/r/g, no peripheral edema, pulses intact, no cyanosis or clubbing.  GI:   Soft & nt; nml bowel sounds; no organomegaly or masses detected.   Musco: Warm bil, no deformities or joint swelling noted.   Neuro: alert, no focal deficits noted.    Skin: Warm, no lesions or rashes    Lab Results:  CBC   BNP No results found for: BNP  ProBNP No results found for: PROBNP  Imaging: Ct Chest High Resolution  Result Date: 11/28/2017 CLINICAL  DATA:  Pulmonary fibrosis, bronchiectasis, occasional cough. EXAM: CT CHEST WITHOUT CONTRAST TECHNIQUE: Multidetector CT imaging of the chest was performed following the standard protocol without intravenous contrast. High resolution imaging of the lungs, as well as inspiratory and expiratory imaging, was performed. COMPARISON:  08/17/2010. FINDINGS: Cardiovascular: Atherosclerotic calcification of the arterial vasculature. Heart size normal. No pericardial effusion. Mediastinum/Nodes: No pathologically enlarged mediastinal or axillary lymph nodes. Hilar regions are difficult to evaluate without IV contrast. Esophagus is grossly unremarkable. Lungs/Pleura: Mild centrilobular emphysema. Progressive peribronchovascular nodularity, peribronchial thickening and mild bronchiectasis affecting all lobes of both lungs. A few discrete pulmonary nodules measure up to 8 mm (7 x 9 mm) in the peripheral right lower lobe, new. Subpleural area of nodular consolidation in the lateral left  lower lobe has enlarged, measuring 1.1 x 1.6 cm, previously 8 x 8 mm. No pleural fluid. Airway is unremarkable. No air trapping. Upper Abdomen: Visualized portions of the liver, adrenal glands, kidneys, spleen, pancreas and stomach are grossly unremarkable. Musculoskeletal: Degenerative changes in the spine. No worrisome lytic or sclerotic lesions. IMPRESSION: 1. Multilobar peribronchovascular nodularity, peribronchial thickening and mild bronchiectasis, progressive from 08/17/2010 and most indicative of chronic mycobacterium avium complex. 2. New discrete pulmonary nodules measure up to 8 mm in the right lower lobe. Enlarging area of subpleural consolidation in the left lower lobe. Findings are also likely due to chronic mycobacterium avium complex and although malignancy is not favored, follow-up CT chest without contrast in 3 months is recommended as adenocarcinoma cannot be definitively excluded. 3.  Aortic atherosclerosis (ICD10-170.0). 4.   Emphysema (ICD10-J43.9). Electronically Signed   By: Lorin Picket M.D.   On: 11/28/2017 14:41     Assessment & Plan:   BRONCHIECTASIS Recurrent exacerbation with increased symptom load.  CT chest with mild progression since 2011 symptoms suspicious for MAI.  Sputum culture and AFB have been negative.  Case discussed with Dr. Halford Chessman .  Patient will be set up for a bronchoscopy for BAL sampling.   Plan  Patient Instructions  Mucinex DM Twice daily  As needed  Cough/congestion  May use Flutter Valve Three times a day.  Follow up with Dr. Halford Chessman 6 weeks and As needed   Please contact office for sooner follow up if symptoms do not improve or worsen or seek emergency care        Abnormal CT scan Will need serial CT follow up .  cT chest in 3 months   GERD Cont on GERD diet and rx       Rexene Edison, NP 12/26/2017

## 2017-12-26 NOTE — Assessment & Plan Note (Addendum)
Will need serial CT follow up .  cT chest in 3 months

## 2017-12-27 NOTE — Addendum Note (Signed)
Addended by: Parke Poisson E on: 12/27/2017 02:07 PM   Modules accepted: Orders

## 2017-12-30 ENCOUNTER — Other Ambulatory Visit (HOSPITAL_COMMUNITY): Payer: Self-pay

## 2018-01-02 ENCOUNTER — Ambulatory Visit (HOSPITAL_COMMUNITY)
Admission: RE | Admit: 2018-01-02 | Discharge: 2018-01-02 | Disposition: A | Discharge status: Home / Self Care | Payer: Medicare HMO | Source: Ambulatory Visit | Attending: Geriatric Medicine | Admitting: Geriatric Medicine

## 2018-01-02 DIAGNOSIS — D509 Iron deficiency anemia, unspecified: Secondary | ICD-10-CM | POA: Diagnosis not present

## 2018-01-02 MED ORDER — SODIUM CHLORIDE 0.9 % IV SOLN
510.0000 mg | Freq: Once | INTRAVENOUS | Status: AC
  Administered 2018-01-02: 510 mg via INTRAVENOUS
  Filled 2018-01-02: qty 17

## 2018-01-02 NOTE — Discharge Instructions (Signed)

## 2018-01-03 ENCOUNTER — Telehealth: Payer: Self-pay | Admitting: Pulmonary Disease

## 2018-01-03 NOTE — Telephone Encounter (Signed)
Asked pt to call back to update status and if no improvement will then schedule bronchoscopy.

## 2018-01-17 ENCOUNTER — Ambulatory Visit: Payer: Medicare HMO | Admitting: Gastroenterology

## 2018-01-21 LAB — MYCOBACTERIA,CULT W/FLUOROCHROME SMEAR
MICRO NUMBER: 90085517
SMEAR:: NONE SEEN
SPECIMEN QUALITY:: ADEQUATE

## 2018-01-23 DIAGNOSIS — D509 Iron deficiency anemia, unspecified: Secondary | ICD-10-CM | POA: Diagnosis not present

## 2018-01-30 ENCOUNTER — Encounter: Payer: Self-pay | Admitting: Neurology

## 2018-01-30 ENCOUNTER — Ambulatory Visit (INDEPENDENT_AMBULATORY_CARE_PROVIDER_SITE_OTHER): Payer: Medicare HMO | Admitting: Neurology

## 2018-01-30 ENCOUNTER — Other Ambulatory Visit: Payer: Self-pay

## 2018-01-30 VITALS — BP 112/80 | HR 63 | Ht 65.0 in | Wt 93.0 lb

## 2018-01-30 DIAGNOSIS — G459 Transient cerebral ischemic attack, unspecified: Secondary | ICD-10-CM | POA: Diagnosis not present

## 2018-01-30 DIAGNOSIS — G629 Polyneuropathy, unspecified: Secondary | ICD-10-CM | POA: Diagnosis not present

## 2018-01-30 NOTE — Progress Notes (Signed)
NEUROLOGY FOLLOW UP OFFICE NOTE  YVAINE JANKOWIAK [id number]  DOB: 1940-12-01  HISTORY OF PRESENT ILLNESS: I had the pleasure of seeing Menaal Russum in follow-up in the neurology clinic on 01/30/2018.  The patient was last seen 6 months ago for neuropathy. She was reporting constant burning and numbness in both legs. She was also reporting pain in her veins with throbbing and swelling. She has seen Vascular surgery, symptoms consistent with chronic venous insufficiency, duplex showed limited valvular insufficiency. It was felt that symptoms are early, for now compression stockings were advised. She continues to feel that the compression stockings are helping, she has found ones with zippers which are easier to put on. She has low dose gabapentin liquid solution that she only takes as needed. She continues to have a lot of burning and tingling on the dorsum of both feet, as well as in both hands. She is starting to drop things more. She feels unsteady and continues to do home PT exercises, which she feels helps her. She denies any falls. She has seen Rheumatology and diagnosed with rheumatoid arthritis, she has a cream she applies on her hands and feet that helps. Headaches are not as bad. She does not recall trying Lyrica on her last visit.   HPI: This is a pleasant 77 yo RH woman with a history of GERD, nutcracker esophagus, pulmonary fibrosis, in her usual state of health until early June 2017 while walking into a grocery she suddenly had left temporal pain, then it felt like she was hit on both side of the head with a hammer. She reports she was "hit like gangbusters," she was very dizzy with a spinning sensation, nauseated. She held on to the cart for balance and had difficulty concentrating. Her vision was blurred. She managed to finish her chore and walked to the car, then noticed that she was having a hard time talking. She could see the sentence broken up in her head and could not get the words out.  She was able to drive home with no focal symptoms noted. The episode lasted 30 minutes or so, she felt weak and tired after. Since then she has had at least 2 or 3 more episodes of headaches with dizziness, which did not progress to speech difficulties. The symptoms would last around 5 minutes, no clear positional component to the dizziness. When she feels it coming on, she massages her head, which seems to help. She was unsure if Tylenol helped. She denies any prior history of headaches. She denies any vision loss, photo/phonophobia, diplopia, dysarthria, jaw claudication, hearing loss. She has occasional pulsatile tinnitus more in the afternoons. She has GERD which makes swallowing difficult. She has a history of neuropathy affecting both feet, R>L, with burning and numbness/tingling for several years. She has been doing laser therapy for venous insufficiency/varicose veins. She denies any falls but trips more. Sleep varies. She is very active and rides her bike for a couple of hours daily. She works at a preschool. Her mother had a stroke at age 91.   PAST MEDICAL HISTORY: Past Medical History:  Diagnosis Date  . Allergy   . Arthritis   . BOOP (bronchiolitis obliterans with organizing pneumonia) ([place])   . Bronchiectasis   . Esophageal dysmotility   . Gastroparesis   . GERD (gastroesophageal reflux disease)    severe  . Iron deficiency anemia   . Neuropathy   . Nutcracker esophagus   . Pneumonia   . Pulmonary fibrosis ([place])   .  Raynaud's disease   . Right lower lobe pneumonia December 2013    MEDICATIONS:  Outpatient Encounter Medications as of 01/30/2018  Medication Sig  . Acetaminophen (TYLENOL EXTRA STRENGTH PO) Take 350 mg by mouth 3 (three) times daily as needed.   . benzonatate (TESSALON) 200 MG capsule Take 1 capsule (200 mg total) by mouth 3 (three) times daily as needed for cough.  . Calcium-Vitamin D-Vitamin K (VIACTIV PO) Take by mouth.  . dexlansoprazole (DEXILANT) 60 MG  capsule Take 1 capsule (60 mg total) by mouth daily.  . fexofenadine (ALLEGRA) 180 MG tablet Take 180 mg by mouth daily.  . folic acid (FOLVITE) 1 MG tablet Take 1 tablet (1 mg total) by mouth daily. (Patient not taking: Reported on 12/26/2017)  . Gabapentin 300 MG/6ML SOLN Take 2 mLs by mouth at bedtime. (Patient taking differently: Take 2 mLs by mouth at bedtime. Taking as needed)  . montelukast (SINGULAIR) 10 MG tablet TAKE 1 TABLET AT BEDTIME  . Multiple Vitamins-Calcium (VIACTIV MULTI-VITAMIN) CHEW Chew 1 tablet by mouth 4 (four) times daily.    [name] PREMARIN 0.3 MG tablet Take 0.3 mg by mouth daily.   [name] Respiratory Therapy Supplies (FLUTTER) DEVI Use as directed.  . Thiamine Mononitrate (VITAMIN B1 PO) Take by mouth.   No facility-administered encounter medications on file as of 01/30/2018.     ALLERGIES: Allergies  Allergen Reactions  . Cortisone     unsure  . Prednisone   . Sulfonamide Derivatives     Unsure of reaction    FAMILY HISTORY: Family History  Problem Relation Age of Onset  . Lymphoma Mother   . Heart disease Father   . Lung cancer Paternal Uncle   . Heart disease Paternal Grandfather   . Leukemia Paternal Grandmother   . Cancer Maternal Grandmother        unknown type  . Colon cancer Neg Hx   . Esophageal cancer Neg Hx   . Rectal cancer Neg Hx   . Stomach cancer Neg Hx     SOCIAL HISTORY: Social History   Socioeconomic History  . Marital status: Widowed    Spouse name: Not on file  . Number of children: 2  . Years of education: Not on file  . Highest education level: Not on file  Social Needs  . Financial resource strain: Not on file  . Food insecurity - worry: Not on file  . Food insecurity - inability: Not on file  . Transportation needs - medical: Not on file  . Transportation needs - non-medical: Not on file  Occupational History  . Occupation: retired Pharmacist, hospital    Comment: still teaching pre K   Tobacco Use  . Smoking status: Former Smoker      Types: Cigarettes    Last attempt to quit: 11/15/1968    Years since quitting: 49.2  . Smokeless tobacco: Never Used  . Tobacco comment: 3 cigs a day  Substance and Sexual Activity  . Alcohol use: No  . Drug use: No  . Sexual activity: Not on file  Other Topics Concern  . Not on file  Social History Narrative   Still working as a Print production planner at [employer].     REVIEW OF SYSTEMS: Constitutional: No fevers, chills, or sweats, no generalized fatigue, change in appetite Eyes: No visual changes, double vision, eye pain Ear, nose and throat: No hearing loss, ear pain, nasal congestion, sore throat Cardiovascular: No chest pain, palpitations Respiratory:  No shortness of breath  at rest or with exertion, wheezes GastrointestinaI: No nausea, vomiting, diarrhea, abdominal pain, fecal incontinence Genitourinary:  No dysuria, urinary retention or frequency Musculoskeletal:  + neck pain, back pain Integumentary: No rash, pruritus, skin lesions Neurological: as above Psychiatric: No depression, insomnia, anxiety Endocrine: No palpitations, fatigue, diaphoresis, mood swings, change in appetite, change in weight, increased thirst Hematologic/Lymphatic:  No anemia, purpura, petechiae. Allergic/Immunologic: no itchy/runny eyes, nasal congestion, recent allergic reactions, rashes  PHYSICAL EXAM: Vitals:   01/30/18 1309  BP: 112/80  Pulse: 63  SpO2: 94%   General: No acute distress Head:  Normocephalic/atraumatic Neck: supple, no paraspinal tenderness, full range of motion Heart:  Regular rate and rhythm Lungs:  Clear to auscultation bilaterally Back: No paraspinal tenderness Skin/Extremities: No rash, no edema, +varicose veins, erythema on lateral dorsum of both feet, tender to palpation Neurological Exam: alert and oriented to person, place, and time. No aphasia or dysarthria. Fund of knowledge is appropriate.  Recent and remote memory are intact.  Attention and concentration  are normal.    Able to name objects and repeat phrases. Cranial nerves: Pupils equal, round, reactive to light. Extraocular movements intact with no nystagmus. Visual fields full. Facial sensation intact. No facial asymmetry. Tongue, uvula, palate midline.  Motor: Bulk and tone normal, muscle strength 5/5 proximally on both UE, 4/5 distally except for 3+/5 bilateral APB, 5/5 on both LE. Sensation decreased to pin above wrists bilaterally, decreased pin to mid-calf bilaterally, decreased cold on both feet, decreased vibration to knees bilaterally, intact JPS. Deep tendon reflexes +1 throughout, toes downgoing.  Finger to nose testing intact.  Gait narrow-based and steady, difficulty with tandem walk.  Romberg negative sway.  IMPRESSION: This is a pleasant 77 yo RH woman with a history of GERD, nutcracker esophagus, pulmonary fibrosis, who had a transient episode of headache, vertigo, and expressive aphasia last June 2017, concerning for TIA. MRI brain no acute changes. Her MRA head and neck was unremarkable except for moderate stenosis of the left P1 and diminished distal flow-related signal within the left PCA distribution. Continue daily aspirin and control of vascular risk factors. No further episodes since June 2017. Her main concern continues to be the burning pain in her feet. She had side effects on low dose gabapentin and only takes it prn, she was given samples of Lyrica to try today. Side effects discussed. She is noting worsening weakness now affecting both hands, exam shows a glove and stocking neuropathy. She had an EMG several years ago, repeat EMG/NCV will be ordered today to further evaluate symptoms. Continue with home PT balance exercises. She will follow-up after the EMG.   Thank you for allowing me to participate in her care.  Please do not hesitate to call for any questions or concerns.  The duration of this appointment visit was 25 minutes of face-to-face time with the patient.  Greater  than 50% of this time was spent in counseling, explanation of diagnosis, planning of further management, and coordination of care.   Ellouise Newer, M.D.   CC: Dr. Felipa Eth

## 2018-01-30 NOTE — Patient Instructions (Signed)
1. Schedule EMG/NCV of the right UE and LE with Dr. Posey Pronto 2. Try Lyrica 50mg : take 1 cap every other night for 1 week, then increase to 1 cap at night. Please update me on how you feel on the medication 3. Continue home PT exercises 4. Follow-up after EMG, call for any changes

## 2018-02-06 ENCOUNTER — Ambulatory Visit: Payer: Medicare HMO | Admitting: Pulmonary Disease

## 2018-02-13 DIAGNOSIS — J479 Bronchiectasis, uncomplicated: Secondary | ICD-10-CM | POA: Diagnosis not present

## 2018-02-13 DIAGNOSIS — E441 Mild protein-calorie malnutrition: Secondary | ICD-10-CM | POA: Diagnosis not present

## 2018-02-13 DIAGNOSIS — M792 Neuralgia and neuritis, unspecified: Secondary | ICD-10-CM | POA: Diagnosis not present

## 2018-02-13 DIAGNOSIS — I700 Atherosclerosis of aorta: Secondary | ICD-10-CM | POA: Diagnosis not present

## 2018-02-14 ENCOUNTER — Ambulatory Visit (INDEPENDENT_AMBULATORY_CARE_PROVIDER_SITE_OTHER): Payer: Medicare HMO | Admitting: Neurology

## 2018-02-14 DIAGNOSIS — G5601 Carpal tunnel syndrome, right upper limb: Secondary | ICD-10-CM

## 2018-02-14 DIAGNOSIS — G629 Polyneuropathy, unspecified: Secondary | ICD-10-CM

## 2018-02-14 DIAGNOSIS — G5621 Lesion of ulnar nerve, right upper limb: Secondary | ICD-10-CM

## 2018-02-14 NOTE — Procedures (Signed)
[facility]  [address]  [place], [place] [address] Tel: [phone] Fax:  [phone] Test Date:  02/14/2018  Patient: [name] DOB: Nov 25, 1940 Physician: Narda Amber, DO  Sex: Female Height: 5\' 5"  Ref Phys: Ellouise Newer, MD  ID#: [id number] Temp: 31.6C Technician:    Patient Complaints: This is a 77 year old female referred for evaluation of burning feet and bilateral hand weakness.  NCV & EMG Findings: Extensive electrodiagnostic testing of the right upper and lower extremity shows:  1. Right median and ulnar sensory responses show prolonged latency. 2. Right median motor responses within normal limits. Right ulnar motor response shows prolonged distal onset latency (3.2 ms), reduced amplitude (6.2 mV), and decreased conduction velocity (A Elbow-B Elbow, 43 m/s). 3. Right superficial peroneal sensory response is absent. Right sural sensory responses within normal limits. 4. Right peroneal motor response is reduced at the extensor digitorum brevis and right tibial motor response shows normal amplitude and there is mild conduction velocity slowing.  5. Right tibial H reflex study is mildly prolonged.  6. Chronic motor axon loss changes are seen affecting the first dorsal interosseous and abductor pollicis brevis muscles, without accompanied active denervation.  7. In the lower extremity, chronic motor axonal loss changes isolated to the muscles below the knee.  Impression: 1. The electrophysiologic findings are most consistent with a chronic sensorimotor axonal polyneuropathy affecting the right lower extremity; these findings are moderate in degree electrically. 2. Right ulnar neuropathy with slowing across the elbow, demyelinating and axon loss in type, moderate in degree electrically. 3. Mild right median neuropathy at or distal to the wrist, consistent with diagnosis of carpal tunnel syndrome.   ___________________________ Narda Amber,  DO    Nerve Conduction Studies Anti Sensory Summary Table   Site NR Peak (ms) Norm Peak (ms) P-T Amp (V) Norm P-T Amp  Right Median Anti Sensory (2nd Digit)  Wrist    4.0 <3.8 19.8 >10  Right Sup Peroneal Anti Sensory (Ant Lat Mall)  12 cm NR  <4.6  >3  Right Sural Anti Sensory (Lat Mall)  Calf    3.7 <4.6 6.8 >3  Right Ulnar Anti Sensory (5th Digit)  Wrist    3.6 <3.2 26.2 >5   Motor Summary Table   Site NR Onset (ms) Norm Onset (ms) O-P Amp (mV) Norm O-P Amp Site1 Site2 Delta-0 (ms) Dist (cm) Vel (m/s) Norm Vel (m/s)  Right Median Motor (Abd Poll Brev)  Wrist    3.8 <4.0 6.5 >5 Elbow Wrist 6.0 30.0 50 >50  Elbow    9.8  6.4         Right Peroneal Motor (Ext Dig Brev)  Ankle    5.8 <6.0 1.4 >2.5 B Fib Ankle 8.7 37.0 43 >40  B Fib    14.5  1.1  Poplt B Fib 1.8 8.0 44 >40  Poplt    16.3  1.1         Right Tibial Motor (Abd Hall Brev)  Ankle    5.5 <6.0 5.7 >4 Knee Ankle 12.2 41.0 34 >40  Knee    17.7  5.0         Right Ulnar Motor (Abd Dig Minimi)  Wrist    3.2 <3.1 6.2 >7 B Elbow Wrist 4.5 24.0 53 >50  B Elbow    7.7  5.8  A Elbow B Elbow 2.3 10.0 43 >50  A Elbow    10.0  5.4  H Reflex Studies   NR H-Lat (ms) Lat Norm (ms) L-R H-Lat (ms)  Right Tibial (Gastroc)     36.33 <35    EMG   Side Muscle Ins Act Fibs Psw Fasc Number Recrt Dur Dur. Amp Amp. Poly Poly. Comment  Right AntTibialis Nml Nml Nml Nml 1- Rapid Some 1+ Some 1+ Nml Nml N/A  Right 1stDorInt Nml Nml Nml Nml 1- Rapid Some 1+ Some 1+ Nml Nml N/A  Right Abd Poll Brev Nml Nml Nml Nml 2- Rapid Many 1+ Some 1+ Nml Nml N/A  Right PronatorTeres Nml Nml Nml Nml Nml Nml Nml Nml Nml Nml Nml Nml N/A  Right Biceps Nml Nml Nml Nml Nml Nml Nml Nml Nml Nml Nml Nml N/A  Right Triceps Nml Nml Nml Nml Nml Nml Nml Nml Nml Nml Nml Nml N/A  Right Deltoid Nml Nml Nml Nml Nml Nml Nml Nml Nml Nml Nml Nml N/A  Right Ext Indicis Nml Nml Nml Nml Nml Nml Nml Nml Nml Nml Nml Nml N/A  Right Gastroc CRD Nml Nml Nml 1- Rapid  Some 1+ Some 1+ Nml Nml N/A  Right Flex Dig Long Nml Nml Nml Nml 1- Rapid Some 1+ Some 1+ Nml Nml N/A  Right RectFemoris Nml Nml Nml Nml Nml Nml Nml Nml Nml Nml Nml Nml N/A  Right GluteusMed Nml Nml Nml Nml Nml Nml Nml Nml Nml Nml Nml Nml N/A      Waveforms:

## 2018-02-16 ENCOUNTER — Telehealth: Payer: Self-pay

## 2018-02-16 DIAGNOSIS — G629 Polyneuropathy, unspecified: Secondary | ICD-10-CM

## 2018-02-16 DIAGNOSIS — G5601 Carpal tunnel syndrome, right upper limb: Secondary | ICD-10-CM

## 2018-02-16 DIAGNOSIS — G5621 Lesion of ulnar nerve, right upper limb: Secondary | ICD-10-CM

## 2018-02-16 NOTE — Telephone Encounter (Signed)
-----   Message from Cameron Sprang, MD sent at 02/15/2018  4:22 PM EDT ----- Pls let her know the nerve test confirmed the neuropathy, but it also showed that the nerve on her right elbow and nerve are being compressed, which is likely causing the weakness in her hand. Recommend evaluation with hand surgery, thanks

## 2018-02-16 NOTE — Telephone Encounter (Signed)
LMOM asking for return call to the office.  Did not relay message below.   

## 2018-02-17 ENCOUNTER — Telehealth: Payer: Self-pay | Admitting: Neurology

## 2018-02-17 NOTE — Telephone Encounter (Signed)
Please see previous phone encounter.

## 2018-02-17 NOTE — Telephone Encounter (Signed)
Pt returned your call from yesterday about test results, asks if you can call her back today after 1pm, she is at work until then CB# [phone]

## 2018-02-17 NOTE — Addendum Note (Signed)
Addended by: Lenny Pastel on: 02/17/2018 03:33 PM   Modules accepted: Orders

## 2018-02-17 NOTE — Telephone Encounter (Signed)
Spoke with pt relaying EMG results.  Pt would like to proceed with hand surgeon eval.  Referral will be sent to Dr. Amedeo Plenty.

## 2018-02-17 NOTE — Telephone Encounter (Signed)
Referral faxed to [facility] to Dr. Amedeo Plenty.

## 2018-02-20 ENCOUNTER — Ambulatory Visit (INDEPENDENT_AMBULATORY_CARE_PROVIDER_SITE_OTHER): Payer: Medicare HMO | Admitting: Gastroenterology

## 2018-02-20 ENCOUNTER — Encounter: Payer: Self-pay | Admitting: Gastroenterology

## 2018-02-20 VITALS — BP 104/64 | HR 68 | Ht 62.5 in | Wt 90.4 lb

## 2018-02-20 DIAGNOSIS — K219 Gastro-esophageal reflux disease without esophagitis: Secondary | ICD-10-CM

## 2018-02-20 DIAGNOSIS — R1310 Dysphagia, unspecified: Secondary | ICD-10-CM

## 2018-02-20 MED ORDER — BACLOFEN 10 MG PO TABS: 10.0000 mg | ORAL_TABLET | Freq: Every day | ORAL | 1 refills | Status: DC

## 2018-02-20 MED ORDER — SUCRALFATE 1 GM/10ML PO SUSP: 1.0000 g | Freq: Four times a day (QID) | ORAL | 1 refills | Status: DC | PRN

## 2018-02-20 NOTE — Progress Notes (Signed)
HPI :  77 year old female with a history of GERD and dysphagia, well-known to me, also with history of pulmonary fibrosis, here for follow-up visit.  She continues to suffer from severe reflux symptoms. She had a prior pH study done in 2016 showing Demeester score of 152.7 but only symptom index of 23% for reflux. Most of reflux was weakly acidic. Esophageal manometry showed what was thought to be nutcracker esophagus at the time. She had been switched to Dexilant following pH study and reported benefit on this regimen. She has tried diltiazem in the past which has not helped much her symptoms. At her last visit we recommended adding a trial of a TCA. She states she never tried this she was fearful of the side effects. She had a barium swallow last November which did not show any stenosis or stricture, and showed normal motility.  Her main symptoms that bother her or reflux. These symptoms include pyrosis in her epigastric care. In her chest, as well as regurgitation of food into her mouth. She states this is present all the time during the day and also has nocturnal symptoms. She does have some hoarse voice she thinks is related to this as well. She also continues to endorse dysphagia, sense of difficulty swallowing in the upper chest. This is mostly to solids. She denies any symptoms of aspiration or coughing with swallows. She continues to take Dexalone 60 mg daily, has been taking Nexium 40 mg as well as night. She does not think the Nexium this provided any additional benefit. She has been taking an herbal supplement for reflux as well.She has never tried baclofen in the past, she is not sure if she is tried Carafate in the past.  She continues to be followed by pulmonology for bronchiectasis, possible Mycobacterium avium infection  Esophogram 10/10/2017  - small sliding type hiatal hernia, no distal stricture / mass, normal motility  CT chest 11/28/2017 - MAC  She otherwise has underwent EGD  and colonscopy for iron deficiency anemia on 08/2015. She also had a positive cologuard. Her colonoscopy was remarkable for 5 adenomas, all small, but this warrants a repeat surveillance colonoscopy to be done in 08/2018. No pathology to cause anemia noted on colonoscopy. Her EGD was remarkable for gastritis in the fundus without focal ulceration. Biopsies show reactive gastritis, no evidence of H pylori. Normal duodenum with normal biopsies. The SCJ was irregular but biopsies show no evidence of Barrett's. She had a capsule endoscopy 10/07/2015 which other than a diminutive red spot was normal. No pathology noted to have caused her anemia. Her Hgb had normalized on oral iron.     Past Medical History:  Diagnosis Date  . Allergy   . Arthritis   . BOOP (bronchiolitis obliterans with organizing pneumonia) ([place])   . Bronchiectasis   . Esophageal dysmotility   . Gastroparesis   . GERD (gastroesophageal reflux disease)    severe  . Iron deficiency anemia   . Neuropathy   . Nutcracker esophagus   . Pneumonia   . Pulmonary fibrosis ([place])   . Raynaud's disease   . Right lower lobe pneumonia December 2013     Past Surgical History:  Procedure Laterality Date  . [place] STUDY N/A 10/13/2015   Procedure: [place] STUDY;  Surgeon: Gatha Mayer, MD;  Location: [facility];  Service: Endoscopy;  Laterality: N/A;  . BREAST LUMPECTOMY     left, x 2  . CATARACT EXTRACTION  both eyes  . CHOLECYSTECTOMY  2005  . COLONOSCOPY    . ESOPHAGEAL MANOMETRY N/A 10/06/2015   Procedure: ESOPHAGEAL MANOMETRY (EM);  Surgeon: Manus Gunning, MD;  Location: [facility];  Service: Gastroenterology;  Laterality: N/A;  . LUNG BIOPSY     vats right 2003  . TOTAL ABDOMINAL HYSTERECTOMY    . UPPER GASTROINTESTINAL ENDOSCOPY     Family History  Problem Relation Age of Onset  . Lymphoma Mother   . Heart disease Father   . Lung cancer Paternal Uncle   . Heart disease Paternal Grandfather   .  Leukemia Paternal Grandmother   . Cancer Maternal Grandmother        unknown type  . Colon cancer Neg Hx   . Esophageal cancer Neg Hx   . Rectal cancer Neg Hx   . Stomach cancer Neg Hx    Social History   Tobacco Use  . Smoking status: Former Smoker    Types: Cigarettes    Last attempt to quit: 11/15/1968    Years since quitting: 49.2  . Smokeless tobacco: Never Used  . Tobacco comment: 3 cigs a day  Substance Use Topics  . Alcohol use: No  . Drug use: No   Current Outpatient Medications  Medication Sig Dispense Refill  . Acetaminophen (TYLENOL EXTRA STRENGTH PO) Take 350 mg by mouth 3 (three) times daily as needed.     . benzonatate (TESSALON) 200 MG capsule Take 1 capsule (200 mg total) by mouth 3 (three) times daily as needed for cough. 60 capsule 1  . Calcium-Vitamin D-Vitamin K (VIACTIV PO) Take by mouth.    . fexofenadine (ALLEGRA) 180 MG tablet Take 180 mg by mouth daily.    . folic acid (FOLVITE) 1 MG tablet Take 1 tablet (1 mg total) by mouth daily. 90 tablet 3  . Gabapentin 300 MG/6ML SOLN Take 2 mLs by mouth at bedtime. (Patient taking differently: Take 2 mLs by mouth at bedtime. Taking as needed) 180 mL 3  . montelukast (SINGULAIR) 10 MG tablet TAKE 1 TABLET AT BEDTIME 90 tablet 1  . Multiple Vitamins-Calcium (VIACTIV MULTI-VITAMIN) CHEW Chew 1 tablet by mouth 4 (four) times daily.      [name] PREMARIN 0.3 MG tablet Take 0.3 mg by mouth daily.     [name] Respiratory Therapy Supplies (FLUTTER) DEVI Use as directed. 1 each 0  . Thiamine Mononitrate (VITAMIN B1 PO) Take by mouth.    . dexlansoprazole (DEXILANT) 60 MG capsule Take 1 capsule (60 mg total) by mouth daily. 90 capsule 3   No current facility-administered medications for this visit.    Allergies  Allergen Reactions  . Cortisone     unsure  . Prednisone   . Sulfonamide Derivatives     Unsure of reaction     Review of Systems: All systems reviewed and negative except where noted in HPI.   Lab Results    Component Value Date   WBC 7.4 04/28/2017   HGB 11.6 (L) 04/28/2017   HCT 36.4 04/28/2017   MCV 86.4 04/28/2017   PLT 392.0 04/28/2017    Lab Results  Component Value Date   CREATININE 0.64 04/28/2017   BUN 11 04/28/2017   NA 138 04/28/2017   K 4.4 04/28/2017   CL 104 04/28/2017   CO2 30 04/28/2017    Lab Results  Component Value Date   ALT 28 04/28/2017   AST 29 04/28/2017   ALKPHOS 65 04/28/2017   BILITOT 0.3 04/28/2017  Physical Exam: Ht 5' 2.5" (1.588 m) Comment: w/o shoes  Wt 90 lb 6.4 oz (41 kg)   BMI 16.27 kg/m  Constitutional: Pleasant, thin female in no acute distress. HEENT: Normocephalic and atraumatic. Conjunctivae are normal. No scleral icterus. Neck supple.  Cardiovascular: Normal rate, regular rhythm.  Pulmonary/chest: Effort normal and breath sounds normal. No wheezing, rales or rhonchi. Abdominal: Soft, nondistended, nontender.  There are no masses palpable. No hepatomegaly. Extremities: no edema Lymphadenopathy: No cervical adenopathy noted. Neurological: Alert and oriented to person place and time. Skin: Skin is warm and dry. No rashes noted. Psychiatric: Normal mood and affect. Behavior is normal.   ASSESSMENT AND PLAN: 77 year old female here for a follow-up visit regarding chronic GERD and dysphagia, in light of ongoing pulmonary issues as well.  GERD / dysphagia - severe reflux symptoms that have been refractory to medical therapy. She's had no additional benefit from taking the Nexium at night, recommend she stop this as I don't think it's providing any benefit and she is at risk for PPI related side effects. Her prior DeMeester score was very elevated on pH test, thus she's clearly having breakthrough reflux, while manometry suggested a component of nutcracker esophagus in 2016. She's had no benefit from diltiazem and given changes in criteria for this diagnosis since then, she may not meet criteria for nutcracker esophagus at present  time.   I do think refractory reflux is driving her symptoms. She may be best served by surgical therapy pending she has no significant dysmotility of her esophagus. Her last barium study looks pretty good in this regard. I'm recommending we repeat her esophageal manometry and confirm this is normal prior to consideration of a surgical consult given her symptoms of dysphagia. She may need a repeat EGD as well if she is wishing to proceed with surgery. Otherwise recommend she continue Dexilant for now, stop Nexium, we'll give her liquid Carafate to use every 6 hours as needed for breakthrough, and following a discussion of risks and benefits of baclofen, will try 10 mg of baclofen daily at bedtime to help with nocturnal symptoms. Given her sensitivity to medications I don't think she will tolerate this for daytime use, thus we'll try low-dose at night to see if this helps initially. I will contact her with results of manometry when available. She agreed with the plan as outlined.  [place] Cellar, MD [facility]

## 2018-02-20 NOTE — Patient Instructions (Addendum)
If you are age 75 or older, your body mass index should be between 23-30. Your Body mass index is 16.27 kg/m. If this is out of the aforementioned range listed, please consider follow up with your Primary Care Provider.  If you are age 16 or younger, your body mass index should be between 19-25. Your Body mass index is 16.27 kg/m. If this is out of the aformentioned range listed, please consider follow up with your Primary Care Provider.   You have been scheduled for an esophageal manometry at [facility] on Friday, 03-10-18 at 12:30pm. Please arrive 30 minutes prior to your procedure for registration. You will need to go to outpatient registration (1st floor of the hospital) first. Make certain to bring your insurance cards as well as a complete list of medications.  Please remember the following:  1) Do not take any muscle relaxants, xanax (alprazolam) or ativan for 1 day prior to your test as well as the day of the test.  2) Nothing to eat or drink after 12:00 midnight on the night before your test.  3) Hold all diabetic medications/insulin the morning of the test. You may eat and take your medications after the test.  4) Hold Baclofen for 4 days prior to the procedure  It will take at least 2 weeks to receive the results of this test from your physician. ------------------------------------------ ABOUT ESOPHAGEAL MANOMETRY Esophageal manometry (muh-NOM-uh-tree) is a test that gauges how well your esophagus works. Your esophagus is the long, muscular tube that connects your throat to your stomach. Esophageal manometry measures the rhythmic muscle contractions (peristalsis) that occur in your esophagus when you swallow. Esophageal manometry also measures the coordination and force exerted by the muscles of your esophagus.  During esophageal manometry, a thin, flexible tube (catheter) that contains sensors is passed through your nose, down your esophagus and into your stomach.  Esophageal manometry can be helpful in diagnosing some mostly uncommon disorders that affect your esophagus.  Why it's done Esophageal manometry is used to evaluate the movement (motility) of food through the esophagus and into the stomach. The test measures how well the circular bands of muscle (sphincters) at the top and bottom of your esophagus open and close, as well as the pressure, strength and pattern of the wave of esophageal muscle contractions that moves food along.  What you can expect Esophageal manometry is an outpatient procedure done without sedation. Most people tolerate it well. You may be asked to change into a hospital gown before the test starts.  During esophageal manometry  While you are sitting up, a member of your health care team sprays your throat with a numbing medication or puts numbing gel in your nose or both.  A catheter is guided through your nose into your esophagus. The catheter may be sheathed in a water-filled sleeve. It doesn't interfere with your breathing. However, your eyes may water, and you may gag. You may have a slight nosebleed from irritation.  After the catheter is in place, you may be asked to lie on your back on an exam table, or you may be asked to remain seated.  You then swallow small sips of water. As you do, a computer connected to the catheter records the pressure, strength and pattern of your esophageal muscle contractions.  During the test, you'll be asked to breathe slowly and smoothly, remain as still as possible, and swallow only when you're asked to do so.  A member of your health care  team may move the catheter down into your stomach while the catheter continues its measurements.  The catheter then is slowly withdrawn. The test usually lasts 20 to 30 minutes.  After esophageal manometry  When your esophageal manometry is complete, you may return to your normal activities  This test typically takes 30-45 minutes to  complete. __________________________________________________________________________  Please discontinue Nexium.  Continue taking Dexilant.  We have sent the following medications to your pharmacy for you to pick up at your convenience: Baclofen 10 mg, Take every evening Carafate 10 ml, Take every 6 hours as needed  We have given you samples of the following medication to take: Carafate: Take every 6 hours as needed  Thank you for entrusting me with your care and for choosing [facility], Dr. Stephenson Cellar

## 2018-02-22 ENCOUNTER — Ambulatory Visit (INDEPENDENT_AMBULATORY_CARE_PROVIDER_SITE_OTHER): Payer: Medicare HMO | Admitting: Pulmonary Disease

## 2018-02-22 ENCOUNTER — Encounter: Payer: Self-pay | Admitting: Pulmonary Disease

## 2018-02-22 VITALS — BP 110/70 | HR 70 | Ht 62.0 in | Wt 88.0 lb

## 2018-02-22 DIAGNOSIS — J479 Bronchiectasis, uncomplicated: Secondary | ICD-10-CM

## 2018-02-22 DIAGNOSIS — R918 Other nonspecific abnormal finding of lung field: Secondary | ICD-10-CM | POA: Diagnosis not present

## 2018-02-22 NOTE — Progress Notes (Signed)
[place] [facility]  Chief Complaint  Patient presents with  . Follow-up    bronchiestasis exacerbation f/u from 6 weeks ago, productuve cough with clear mucous     Vital signs: BP 110/70   Pulse 70   Ht 5\' 2"  (1.575 m)   Wt 88 lb (39.9 kg)   SpO2 97%   BMI 16.10 kg/m   History of Present Illness: [name] is a 77 y.o. female former smoker with bronchiectasis, recurrent aspiration pneumonia, and history of cryptogenic organizing pneumonia.  She is still having cough and chest congestion.  She is bringing up clear sputum.  She feels more fatigued.  She had an episode of chest tightness a few weeks ago.  No fever, or hemoptysis.  She has sinus congestion and post nasal drip.   Physical Exam:  General - pleasant, thin Eyes - pupils reactive ENT - no sinus tenderness, no oral exudate, no LAN Cardiac - regular, no murmur Chest - scattered rhonchi Abd - soft, non tender Ext - no edema Skin - no rashes Neuro - normal strength Psych - normal mood   Assessment/Plan:  Bronchiectasis. - her recent CT chest showed BTX and nodularity with concern for MAI - attempt at sputum cultures was unrevealing - will arrange for repeat CT chest to further assess current status - will then determine if she needs bronchoscopy with more direct airway sampling   Patient Instructions  Will reschedule CT chest and call with results  Follow up in 2 months   Chesley Mires, MD [place] 02/22/2018, 5:09 PM Pager:  [phone]  Flow Sheet  Pulmonary tests: PFT 12/31/09 >> FEV1 1.84(84%), FEV1% 73, TLC 4.94(98%), DLCO 77%, +BD. CT chest 08/17/10 >> mild biapical scarring, basilar peribronchovascular nodularity and mild bronchiectasis PFT 06/21/16 >> FEV1 1.22 (55%), FEV1% 76, TLC 5.29 (104%), DLCO 43% PFT 12/21/16 >> FEV1 1.49 (69%), FEV1% 78, TLC 5.42 (107%), RV 158%, DLCO 51% HRCT chest 11/28/17 >> mild centrilobular emphysema,  peribronchovascular nodularity, mild BTX, LLL consolidation  Cardiac tests Echo 07/16/16 >> EF 60 to 65%, grade 2 DD, mild MR, small PFO, PAS 27 mmHg  Past Medical History: She  has a past medical history of Allergy, Arthritis, BOOP (bronchiolitis obliterans with organizing pneumonia) ([place]), Bronchiectasis, Esophageal dysmotility, Gastroparesis, GERD (gastroesophageal reflux disease), Iron deficiency anemia, Neuropathy, Nutcracker esophagus, Pneumonia, Pulmonary fibrosis ([place]), Raynaud's disease, and Right lower lobe pneumonia (December 2013).  Past Surgical History: She  has a past surgical history that includes Cholecystectomy (2005); Breast lumpectomy; Total abdominal hysterectomy; Lung biopsy; Cataract extraction; Colonoscopy; Upper gastrointestinal endoscopy; Esophageal manometry (N/A, 10/06/2015); and 24 hour ph study (N/A, 10/13/2015).  Family History: Her family history includes Cancer in her maternal grandmother; Heart disease in her father and paternal grandfather; Leukemia in her paternal grandmother; Lung cancer in her paternal uncle; Lymphoma in her mother.  Social History: She  reports that she quit smoking about 49 years ago. Her smoking use included cigarettes. She has never used smokeless tobacco. She reports that she does not drink alcohol or use drugs.  Medications: Allergies as of 02/22/2018      Reactions   Cortisone    unsure   Prednisone    Sulfonamide Derivatives    Unsure of reaction      Medication List        Accurate as of 02/22/18  5:09 PM. Always use your most recent med list.          baclofen 10  MG tablet Commonly known as:  LIORESAL Take 1 tablet (10 mg total) by mouth at bedtime.   benzonatate 200 MG capsule Commonly known as:  TESSALON Take 1 capsule (200 mg total) by mouth 3 (three) times daily as needed for cough.   dexlansoprazole 60 MG capsule Commonly known as:  DEXILANT Take 1 capsule (60 mg total) by mouth daily.   fexofenadine  180 MG tablet Commonly known as:  ALLEGRA Take 180 mg by mouth daily.   FLUTTER Devi Use as directed.   Gabapentin 300 MG/6ML Soln Take 2 mLs by mouth at bedtime.   montelukast 10 MG tablet Commonly known as:  SINGULAIR TAKE 1 TABLET AT BEDTIME   PREMARIN 0.3 MG tablet Generic drug:  estrogens (conjugated) Take 0.3 mg by mouth daily.   sucralfate 1 GM/10ML suspension Commonly known as:  CARAFATE Take 10 mLs (1 g total) by mouth every 6 (six) hours as needed.   TYLENOL EXTRA STRENGTH PO Take 350 mg by mouth 3 (three) times daily as needed.   VIACTIV MULTI-VITAMIN Chew Chew 1 tablet by mouth 4 (four) times daily.   VIACTIV PO Take by mouth.   VITAMIN B1 PO Take by mouth.

## 2018-02-22 NOTE — Patient Instructions (Signed)
Will reschedule CT chest and call with results  Follow up in 2 months

## 2018-03-01 ENCOUNTER — Ambulatory Visit (INDEPENDENT_AMBULATORY_CARE_PROVIDER_SITE_OTHER)
Admission: RE | Admit: 2018-03-01 | Discharge: 2018-03-01 | Disposition: A | Discharge status: Home / Self Care | Payer: Medicare HMO | Source: Ambulatory Visit | Attending: Adult Health | Admitting: Adult Health

## 2018-03-01 DIAGNOSIS — R918 Other nonspecific abnormal finding of lung field: Secondary | ICD-10-CM

## 2018-03-01 DIAGNOSIS — R9389 Abnormal findings on diagnostic imaging of other specified body structures: Secondary | ICD-10-CM

## 2018-03-06 ENCOUNTER — Other Ambulatory Visit: Payer: Self-pay | Admitting: *Deleted

## 2018-03-06 MED ORDER — AMOXICILLIN-POT CLAVULANATE 875-125 MG PO TABS: 1.0000 | ORAL_TABLET | Freq: Two times a day (BID) | ORAL | 0 refills | Status: DC

## 2018-03-07 ENCOUNTER — Telehealth: Payer: Self-pay | Admitting: Pulmonary Disease

## 2018-03-07 NOTE — Telephone Encounter (Signed)
CT chest 03/02/18 >> patchy nodular opacities at bases Rt > Lt, mild BTX, mild emphysema   Discussed results with pt.  Explained findings are most likely related to reflux and aspiration.  Advised her to complete course of augmentin.  She has esophageal manometry set up with GI for later this week.

## 2018-03-08 DIAGNOSIS — R64 Cachexia: Secondary | ICD-10-CM | POA: Diagnosis not present

## 2018-03-08 DIAGNOSIS — J8410 Pulmonary fibrosis, unspecified: Secondary | ICD-10-CM | POA: Diagnosis not present

## 2018-03-08 DIAGNOSIS — I499 Cardiac arrhythmia, unspecified: Secondary | ICD-10-CM | POA: Diagnosis not present

## 2018-03-08 DIAGNOSIS — H04123 Dry eye syndrome of bilateral lacrimal glands: Secondary | ICD-10-CM | POA: Diagnosis not present

## 2018-03-08 DIAGNOSIS — G8929 Other chronic pain: Secondary | ICD-10-CM | POA: Diagnosis not present

## 2018-03-08 DIAGNOSIS — K219 Gastro-esophageal reflux disease without esophagitis: Secondary | ICD-10-CM | POA: Diagnosis not present

## 2018-03-08 DIAGNOSIS — G629 Polyneuropathy, unspecified: Secondary | ICD-10-CM | POA: Diagnosis not present

## 2018-03-08 DIAGNOSIS — J302 Other seasonal allergic rhinitis: Secondary | ICD-10-CM | POA: Diagnosis not present

## 2018-03-08 DIAGNOSIS — M1990 Unspecified osteoarthritis, unspecified site: Secondary | ICD-10-CM | POA: Diagnosis not present

## 2018-03-08 DIAGNOSIS — R32 Unspecified urinary incontinence: Secondary | ICD-10-CM | POA: Diagnosis not present

## 2018-03-10 ENCOUNTER — Encounter (HOSPITAL_COMMUNITY)
Admission: RE | Disposition: A | Discharge status: Home / Self Care | Payer: Self-pay | Source: Ambulatory Visit | Attending: Gastroenterology

## 2018-03-10 ENCOUNTER — Ambulatory Visit (HOSPITAL_COMMUNITY)
Admission: RE | Admit: 2018-03-10 | Discharge: 2018-03-10 | Disposition: A | Discharge status: Home / Self Care | Payer: Medicare HMO | Source: Ambulatory Visit | Attending: Gastroenterology | Admitting: Gastroenterology

## 2018-03-10 DIAGNOSIS — K219 Gastro-esophageal reflux disease without esophagitis: Secondary | ICD-10-CM | POA: Diagnosis not present

## 2018-03-10 DIAGNOSIS — R1310 Dysphagia, unspecified: Secondary | ICD-10-CM | POA: Insufficient documentation

## 2018-03-10 HISTORY — PX: ESOPHAGEAL MANOMETRY: SHX5429

## 2018-03-10 SURGERY — MANOMETRY, ESOPHAGUS

## 2018-03-10 MED ORDER — LIDOCAINE VISCOUS 2 % MT SOLN
OROMUCOSAL | Status: AC
  Filled 2018-03-10: qty 15

## 2018-03-10 SURGICAL SUPPLY — 2 items
FACESHIELD LNG OPTICON STERILE (SAFETY) IMPLANT
GLOVE BIO SURGEON STRL SZ8 (GLOVE) ×4 IMPLANT

## 2018-03-10 NOTE — Progress Notes (Signed)
Esophageal Manometry done per protocol.  Pt tolerated well without distress or complication. Report to be sent to Dr. Silverio Decamp.

## 2018-03-13 ENCOUNTER — Encounter (HOSPITAL_COMMUNITY): Payer: Self-pay | Admitting: Gastroenterology

## 2018-03-23 ENCOUNTER — Telehealth: Payer: Self-pay

## 2018-03-23 NOTE — Telephone Encounter (Signed)
Faxed referral to [facility] attn: Dr. Hassell Done to evaluate for nissen fundoplication.

## 2018-03-27 ENCOUNTER — Other Ambulatory Visit: Payer: Medicare HMO

## 2018-04-13 ENCOUNTER — Other Ambulatory Visit: Payer: Self-pay | Admitting: Pulmonary Disease

## 2018-04-17 DIAGNOSIS — N951 Menopausal and female climacteric states: Secondary | ICD-10-CM | POA: Diagnosis not present

## 2018-04-17 DIAGNOSIS — N819 Female genital prolapse, unspecified: Secondary | ICD-10-CM | POA: Diagnosis not present

## 2018-04-17 DIAGNOSIS — Z01411 Encounter for gynecological examination (general) (routine) with abnormal findings: Secondary | ICD-10-CM | POA: Diagnosis not present

## 2018-04-26 ENCOUNTER — Telehealth: Payer: Self-pay

## 2018-04-26 NOTE — Telephone Encounter (Signed)
Called [facility], patient is scheduled to see Dr. Hassell Done on 05/04/18 at 11:00.

## 2018-05-01 ENCOUNTER — Ambulatory Visit (INDEPENDENT_AMBULATORY_CARE_PROVIDER_SITE_OTHER): Payer: Medicare HMO | Admitting: Pulmonary Disease

## 2018-05-01 ENCOUNTER — Encounter: Payer: Self-pay | Admitting: Pulmonary Disease

## 2018-05-01 VITALS — BP 102/68 | HR 71 | Ht 62.0 in | Wt 87.0 lb

## 2018-05-01 DIAGNOSIS — R911 Solitary pulmonary nodule: Secondary | ICD-10-CM | POA: Diagnosis not present

## 2018-05-01 DIAGNOSIS — J479 Bronchiectasis, uncomplicated: Secondary | ICD-10-CM

## 2018-05-01 DIAGNOSIS — K219 Gastro-esophageal reflux disease without esophagitis: Secondary | ICD-10-CM | POA: Diagnosis not present

## 2018-05-01 NOTE — Patient Instructions (Signed)
Will schedule CT chest for October 2019 and follow up after that

## 2018-05-01 NOTE — Progress Notes (Signed)
[place] [facility]  Chief Complaint  Patient presents with  . Follow-up    Pt has productive cough-thick yellow/grey, wheezing, SOB with exertion, and chest tightness.    Vital signs: BP 102/68 (BP Location: Left Arm, Cuff Size: Normal)   Pulse 71   Ht 5\' 2"  (1.575 m)   Wt 87 lb (39.5 kg)   SpO2 97%   BMI 15.91 kg/m   History of Present Illness: [name] is a 77 y.o. female former smoker with bronchiectasis, recurrent aspiration pneumonia, and history of cryptogenic organizing pneumonia.  She is followed by Dr. Havery Moros with GI.  She has referral to Dr. Hassell Done with surgery.  She has cough with clear, thick sputum.  Intermittently uses mucinex and flutter valve.  She uses singulair and antihistamine daily.  She keeps up with activities and rides her bike daily.  Not having fever, wheeze, hemoptysis.   Physical Exam:  General - pleasant, thin Eyes - pupils reactive, wears glasses ENT - no sinus tenderness, no oral exudate, no LAN Cardiac - regular, no murmur Chest - no wheeze, rales Abd - soft, non tender Ext - no edema Skin - no rashes Neuro - normal strength Psych - normal mood   Assessment/Plan:  Bronchiectasis in setting of recurrent aspiration. - continue mucinex, flutter valve, singulair, antihistamine - discussed option of chest vest >> she would like to defer for now  Lung nodules. - f/u CT chest w/o contrast in October 2019    Patient Instructions  Will schedule CT chest for October 2019 and follow up after that   Chesley Mires, MD [place] 05/01/2018, 12:06 PM Pager:  [phone]  Flow Sheet  Pulmonary tests: PFT 12/31/09 >> FEV1 1.84(84%), FEV1% 73, TLC 4.94(98%), DLCO 77%, +BD. CT chest 08/17/10 >> mild biapical scarring, basilar peribronchovascular nodularity and mild bronchiectasis PFT 06/21/16 >> FEV1 1.22 (55%), FEV1% 76, TLC 5.29 (104%), DLCO 43% PFT 12/21/16 >> FEV1 1.49 (69%),  FEV1% 78, TLC 5.42 (107%), RV 158%, DLCO 51% HRCT chest 11/28/17 >> mild centrilobular emphysema, peribronchovascular nodularity, mild BTX, LLL consolidation CT chest 03/02/18 >> mild emphysema, areas of BTX, several nodular areas up to 1.4 cm  Cardiac tests Echo 07/16/16 >> EF 60 to 65%, grade 2 DD, mild MR, small PFO, PAS 27 mmHg  Past Medical History: She  has a past medical history of Allergy, Arthritis, BOOP (bronchiolitis obliterans with organizing pneumonia) ([place]), Bronchiectasis, Esophageal dysmotility, Gastroparesis, GERD (gastroesophageal reflux disease), Iron deficiency anemia, Neuropathy, Nutcracker esophagus, Pneumonia, Pulmonary fibrosis ([place]), Raynaud's disease, and Right lower lobe pneumonia (December 2013).  Past Surgical History: She  has a past surgical history that includes Cholecystectomy (2005); Breast lumpectomy; Total abdominal hysterectomy; Lung biopsy; Cataract extraction; Colonoscopy; Upper gastrointestinal endoscopy; Esophageal manometry (N/A, 10/06/2015); 24 hour ph study (N/A, 10/13/2015); and Esophageal manometry (N/A, 03/10/2018).  Family History: Her family history includes Cancer in her maternal grandmother; Heart disease in her father and paternal grandfather; Leukemia in her paternal grandmother; Lung cancer in her paternal uncle; Lymphoma in her mother.  Social History: She  reports that she quit smoking about 49 years ago. Her smoking use included cigarettes. She has never used smokeless tobacco. She reports that she does not drink alcohol or use drugs.  Medications: Allergies as of 05/01/2018      Reactions   Cortisone    unsure   Prednisone    Sulfonamide Derivatives    Unsure of reaction      Medication List  Accurate as of 05/01/18 12:06 PM. Always use your most recent med list.          baclofen 10 MG tablet Commonly known as:  LIORESAL Take 1 tablet (10 mg total) by mouth at bedtime.   benzonatate 200 MG capsule Commonly known as:   TESSALON Take 1 capsule (200 mg total) by mouth 3 (three) times daily as needed for cough.   dexlansoprazole 60 MG capsule Commonly known as:  DEXILANT Take 1 capsule (60 mg total) by mouth daily.   fexofenadine 180 MG tablet Commonly known as:  ALLEGRA Take 180 mg by mouth daily.   FLUTTER Devi Use as directed.   Gabapentin 300 MG/6ML Soln Take 2 mLs by mouth at bedtime.   montelukast 10 MG tablet Commonly known as:  SINGULAIR TAKE 1 TABLET AT BEDTIME   PREMARIN 0.3 MG tablet Generic drug:  estrogens (conjugated) Take 0.3 mg by mouth daily.   sucralfate 1 GM/10ML suspension Commonly known as:  CARAFATE Take 10 mLs (1 g total) by mouth every 6 (six) hours as needed.   TYLENOL EXTRA STRENGTH PO Take 350 mg by mouth 3 (three) times daily as needed.   VIACTIV MULTI-VITAMIN Chew Chew 1 tablet by mouth 4 (four) times daily.   VIACTIV PO Take by mouth.   VITAMIN B1 PO Take by mouth.

## 2018-05-04 DIAGNOSIS — K449 Diaphragmatic hernia without obstruction or gangrene: Secondary | ICD-10-CM | POA: Diagnosis not present

## 2018-05-04 DIAGNOSIS — K219 Gastro-esophageal reflux disease without esophagitis: Secondary | ICD-10-CM | POA: Diagnosis not present

## 2018-06-26 ENCOUNTER — Encounter: Payer: Self-pay | Admitting: Pulmonary Disease

## 2018-06-26 ENCOUNTER — Ambulatory Visit (INDEPENDENT_AMBULATORY_CARE_PROVIDER_SITE_OTHER): Payer: Medicare HMO | Admitting: Pulmonary Disease

## 2018-06-26 VITALS — BP 112/60 | HR 67 | Ht 62.0 in | Wt 89.0 lb

## 2018-06-26 DIAGNOSIS — J471 Bronchiectasis with (acute) exacerbation: Secondary | ICD-10-CM

## 2018-06-26 MED ORDER — BENZONATATE 200 MG PO CAPS: 200.0000 mg | ORAL_CAPSULE | Freq: Three times a day (TID) | ORAL | 1 refills | Status: DC | PRN

## 2018-06-26 MED ORDER — AMOXICILLIN-POT CLAVULANATE 875-125 MG PO TABS: 1.0000 | ORAL_TABLET | Freq: Two times a day (BID) | ORAL | 1 refills | Status: DC

## 2018-06-26 NOTE — Patient Instructions (Signed)
Augmentin 1 pill twice daily for 10 days, and refill if not full recovered Tessalon 200 mg every 8 hours as needed for cough Try using mucinex and flutter valve to help loosen phlegm  Follow up in October 2019

## 2018-06-26 NOTE — Progress Notes (Signed)
[facility]  Chief Complaint  Patient presents with  . Acute Visit    congestion, productive cough, weakness, sore throat.     Constitutional: BP 112/60 (BP Location: Left Arm, Cuff Size: Normal)   Pulse 67   Ht 5\' 2"  (1.575 m)   Wt 89 lb (40.4 kg)   SpO2 93%   BMI 16.28 kg/m   History of Present Illness: [name] is a 77 y.o. female former smoker with bronchiectasis, recurrent aspiration pneumonia, and history of cryptogenic organizing pneumonia.  She started having trouble with cough again about 2 weeks ago.  Also having sinus congestion.  Feeling like she has no energy and more short of breath.  Coughing up black sputum with increased volume.  Has discomfort in lower chest Lt > Rt.  Not having fever.  Denies diarrhea.  Hasn't been using mucinex or flutter valve.   Comprehensive Respiratory Exam:  Appearance - thin ENMT - nasal mucosa moist, turbinates clear, midline nasal septum, no dental lesions, no gingival bleeding, no oral exudates, no tonsillar hypertrophy Neck - no masses, trachea midline, no thyromegaly, no elevation in JVP Respiratory - coarse breath sounds b/l with scattered rhonchi, no wheeze, no dullness, normal respiratory excursion CV - s1s2 regular rate and rhythm, no murmurs, no peripheral edema, radial pulses symmetric GI - soft, non tender, no masses Lymph - no adenopathy noted in neck and axillary areas MSK - normal muscle strength and tone, normal gait Ext - no cyanosis, clubbing, or joint inflammation noted Skin - no rashes, lesions, or ulcers Neuro - oriented to person, place, and time Psych - normal mood and affect  Assessment/Plan:  Bronchiectasis exacerbation. Bronchiectasis in setting of recurrent aspiration. - will give course of augmentin - advised her to try using mucinex and flutter valve more - continue singulair, allegra - she wants to defer chest vest   Lung nodules. - f/u CT chest in  October 2019   Patient Instructions  Augmentin 1 pill twice daily for 10 days, and refill if not full recovered Tessalon 200 mg every 8 hours as needed for cough Try using mucinex and flutter valve to help loosen phlegm  Follow up in October 2019   Chesley Mires, MD [place] 06/26/2018, 3:01 PM Pager:  [phone]  Flow Sheet  Pulmonary tests: PFT 12/31/09 >> FEV1 1.84(84%), FEV1% 73, TLC 4.94(98%), DLCO 77%, +BD. CT chest 08/17/10 >> mild biapical scarring, basilar peribronchovascular nodularity and mild bronchiectasis PFT 06/21/16 >> FEV1 1.22 (55%), FEV1% 76, TLC 5.29 (104%), DLCO 43% PFT 12/21/16 >> FEV1 1.49 (69%), FEV1% 78, TLC 5.42 (107%), RV 158%, DLCO 51% HRCT chest 11/28/17 >> mild centrilobular emphysema, peribronchovascular nodularity, mild BTX, LLL consolidation CT chest 03/02/18 >> mild emphysema, areas of BTX, several nodular areas up to 1.4 cm  Cardiac tests Echo 07/16/16 >> EF 60 to 65%, grade 2 DD, mild MR, small PFO, PAS 27 mmHg  Past Medical History: She  has a past medical history of Allergy, Arthritis, BOOP (bronchiolitis obliterans with organizing pneumonia) ([place]), Bronchiectasis, Esophageal dysmotility, Gastroparesis, GERD (gastroesophageal reflux disease), Iron deficiency anemia, Neuropathy, Nutcracker esophagus, Pneumonia, Pulmonary fibrosis ([place]), Raynaud's disease, and Right lower lobe pneumonia (December 2013).  Past Surgical History: She  has a past surgical history that includes Cholecystectomy (2005); Breast lumpectomy; Total abdominal hysterectomy; Lung biopsy; Cataract extraction; Colonoscopy; Upper gastrointestinal endoscopy; Esophageal manometry (N/A, 10/06/2015); 24 hour ph study (N/A, 10/13/2015); and Esophageal manometry (N/A, 03/10/2018).  Family History: Her family  history includes Cancer in her maternal grandmother; Heart disease in her father and paternal grandfather; Leukemia in her paternal grandmother; Lung cancer in her  paternal uncle; Lymphoma in her mother.  Social History: She  reports that she quit smoking about 49 years ago. Her smoking use included cigarettes. She has never used smokeless tobacco. She reports that she does not drink alcohol or use drugs.  Medications: Allergies as of 06/26/2018      Reactions   Cortisone    unsure   Prednisone    Sulfonamide Derivatives    Unsure of reaction      Medication List        Accurate as of 06/26/18  3:01 PM. Always use your most recent med list.          amoxicillin-clavulanate 875-125 MG tablet Commonly known as:  AUGMENTIN Take 1 tablet by mouth 2 (two) times daily.   baclofen 10 MG tablet Commonly known as:  LIORESAL Take 1 tablet (10 mg total) by mouth at bedtime.   benzonatate 200 MG capsule Commonly known as:  TESSALON Take 1 capsule (200 mg total) by mouth 3 (three) times daily as needed for cough.   dexlansoprazole 60 MG capsule Commonly known as:  DEXILANT Take 1 capsule (60 mg total) by mouth daily.   fexofenadine 180 MG tablet Commonly known as:  ALLEGRA Take 180 mg by mouth daily.   FLUTTER Devi Use as directed.   Gabapentin 300 MG/6ML Soln Take 2 mLs by mouth at bedtime.   montelukast 10 MG tablet Commonly known as:  SINGULAIR TAKE 1 TABLET AT BEDTIME   PREMARIN 0.3 MG tablet Generic drug:  estrogens (conjugated) Take 0.3 mg by mouth daily.   sucralfate 1 GM/10ML suspension Commonly known as:  CARAFATE Take 10 mLs (1 g total) by mouth every 6 (six) hours as needed.   TYLENOL EXTRA STRENGTH PO Take 350 mg by mouth 3 (three) times daily as needed.   VIACTIV MULTI-VITAMIN Chew Chew 1 tablet by mouth 4 (four) times daily.   VITAMIN B1 PO Take by mouth.

## 2018-06-28 ENCOUNTER — Other Ambulatory Visit: Payer: Self-pay | Admitting: Gastroenterology

## 2018-07-12 DIAGNOSIS — D649 Anemia, unspecified: Secondary | ICD-10-CM | POA: Diagnosis not present

## 2018-08-01 DIAGNOSIS — H0100B Unspecified blepharitis left eye, upper and lower eyelids: Secondary | ICD-10-CM | POA: Diagnosis not present

## 2018-08-01 DIAGNOSIS — H43813 Vitreous degeneration, bilateral: Secondary | ICD-10-CM | POA: Diagnosis not present

## 2018-08-01 DIAGNOSIS — H0100A Unspecified blepharitis right eye, upper and lower eyelids: Secondary | ICD-10-CM | POA: Diagnosis not present

## 2018-08-01 DIAGNOSIS — H04123 Dry eye syndrome of bilateral lacrimal glands: Secondary | ICD-10-CM | POA: Diagnosis not present

## 2018-08-25 ENCOUNTER — Ambulatory Visit (INDEPENDENT_AMBULATORY_CARE_PROVIDER_SITE_OTHER)
Admission: RE | Admit: 2018-08-25 | Discharge: 2018-08-25 | Disposition: A | Discharge status: Home / Self Care | Payer: Medicare HMO | Source: Ambulatory Visit | Attending: Pulmonary Disease | Admitting: Pulmonary Disease

## 2018-08-25 DIAGNOSIS — R911 Solitary pulmonary nodule: Secondary | ICD-10-CM | POA: Diagnosis not present

## 2018-08-25 DIAGNOSIS — R918 Other nonspecific abnormal finding of lung field: Secondary | ICD-10-CM | POA: Diagnosis not present

## 2018-08-28 ENCOUNTER — Ambulatory Visit (INDEPENDENT_AMBULATORY_CARE_PROVIDER_SITE_OTHER): Payer: Medicare HMO | Admitting: Pulmonary Disease

## 2018-08-28 ENCOUNTER — Encounter: Payer: Self-pay | Admitting: Pulmonary Disease

## 2018-08-28 VITALS — BP 108/62 | HR 71 | Ht 61.0 in | Wt 86.4 lb

## 2018-08-28 DIAGNOSIS — J471 Bronchiectasis with (acute) exacerbation: Secondary | ICD-10-CM

## 2018-08-28 MED ORDER — SODIUM CHLORIDE 3 % IN NEBU: INHALATION_SOLUTION | RESPIRATORY_TRACT | 12 refills | Status: DC | PRN

## 2018-08-28 MED ORDER — AMOXICILLIN-POT CLAVULANATE 875-125 MG PO TABS: 1.0000 | ORAL_TABLET | Freq: Two times a day (BID) | ORAL | 0 refills | Status: DC

## 2018-08-28 MED ORDER — ALBUTEROL SULFATE (2.5 MG/3ML) 0.083% IN NEBU: 2.5000 mg | INHALATION_SOLUTION | Freq: Four times a day (QID) | RESPIRATORY_TRACT | 12 refills | Status: DC | PRN

## 2018-08-28 MED ORDER — BENZONATATE 200 MG PO CAPS: 200.0000 mg | ORAL_CAPSULE | Freq: Three times a day (TID) | ORAL | 2 refills | Status: DC | PRN

## 2018-08-28 NOTE — Patient Instructions (Addendum)
Augmetin one pill twice per day Albuterol one vial nebulized daily Hypertonic saline one vial nebulized daily Mucinex one pill nightly Can stop using singulair Tessalon perles three times per day as needed for cough Follow up in 4 weeks with Dr. Halford Chessman or Nurse Practitioner

## 2018-08-28 NOTE — Progress Notes (Signed)
[facility]  Chief Complaint  Patient presents with  . Follow-up    breathing feels "harder" but exercise seems to help.  Prod thick  yellowish phlegm and some discomfort left lung    Constitutional:  BP 108/62 (BP Location: Left Arm, Patient Position: Sitting, Cuff Size: Normal)   Pulse 71   Ht 5\' 1"  (1.549 m)   Wt 86 lb 6.4 oz (39.2 kg)   SpO2 99%   BMI 16.33 kg/m   Past Medical History:  OA, Esophageal dysmotility, Nutcracker esophagus, Gastroparesis, GERD, Iron deficiency anemia, Neuropathy, Raynaud's  Brief Summary:  [name] is a 77 y.o. female former smoker with bronchiectasis, recurrent aspiration pneumonia, and history of cryptogenic organizing pneumonia.  She felt better when she was on ABx.  Has been getting more fatigued over past few weeks.  Feels like her breathing is getting more labored.  Has cough with thick, yellow/green sputum.  Not having chest pain, fever, or hemoptysis.  Doesn't like using flutter valve.  Using mucinex at night; makes her sleepy if she uses during the day.  Has reflux and gets sore in her throat.  Doesn't feel like singulair helps.  Wants to minimize her medications as able, but realizes her symptoms have progressed.  She tries to exercise daily, and feels this helps clear up her lungs.  Physical Exam:   Appearance -thin ENMT - clear nasal mucosa, midline nasal septum, no oral exudates, no LAN, trachea midline Respiratory - normal chest wall, normal respiratory effort, no accessory muscle use, faint b/l crackles, no wheeze CV - s1s2 regular rate and rhythm, no murmurs, no peripheral edema, radial pulses symmetric GI - soft, non tender, no masses Lymph - no adenopathy noted in neck and axillary areas MSK - normal muscle strength and tone, normal gait Ext - no cyanosis, clubbing, or joint inflammation noted Skin - no rashes, lesions, or ulcers Neuro - oriented to person, place, and time Psych -  normal mood and affect  Discussion:  She has bronchiectasis with recurrent pneumonias in setting of gastroesophageal reflux.  She has recurrent respiratory infection with increased sputum production.  This is contributing to her progressive shortness of breath and fatigue.  Her CT chest findings represent varying degrees of infection and mucus plugging.  I am not concerned about malignancy at this point.  Assessment/Plan:   Bronchiectasis with recurrent exacerbation. - will give additional course of augmentin - might need to consider alternating antibiotic schedule if her symptoms persist/progress - albuterol nebulized daily - hypertonic saline nebulized daily - mucinex at night - prn tessalon - d/c singulair since she isn't having any benefit - f/u CT chest if her symptoms progress  GERD with dysphagia. - she will f/u with Dr. Havery Moros with GI   Patient Instructions  Augmetin one pill twice per day Albuterol one vial nebulized daily Hypertonic saline one vial nebulized daily Mucinex one pill nightly Can stop using singulair     Chesley Mires, MD [place] Pager: [phone] 08/28/2018, 11:57 AM  Flow Sheet     Pulmonary tests:  PFT 12/31/09 >> FEV1 1.84(84%), FEV1% 73, TLC 4.94(98%), DLCO 77%, +BD. PFT 06/21/16 >> FEV1 1.22 (55%), FEV1% 76, TLC 5.29 (104%), DLCO 43% PFT 12/21/16 >> FEV1 1.49 (69%), FEV1% 78, TLC 5.42 (107%), RV 158%, DLCO 51%  Chest imaging:  CT chest 08/17/10 >> mild biapical scarring, basilar peribronchovascular nodularity and mild bronchiectasis HRCT chest 11/28/17 >> mild centrilobular emphysema, peribronchovascular nodularity, mild BTX,  LLL consolidation CT chest 03/02/18 >> mild emphysema, areas of BTX, several nodular areas up to 1.4 cm CT chest 08/25/18 >> centrilobular emphysema, BTX with wall thickening and tree in bud RML/lingula/bilateral lower lobes, unchanged opacity LLL, new patchy opacities LUL  (reviewed by  me)  Cardiac tests:  Echo9/01/17 >> EF 60 to 65%, grade 2 DD, mild MR, small PFO, PAS 27 mmHg   Medications:   Allergies as of 08/28/2018      Reactions   Cortisone    unsure   Prednisone    Sulfonamide Derivatives    Unsure of reaction      Medication List        Accurate as of 08/28/18 11:57 AM. Always use your most recent med list.          albuterol (2.5 MG/3ML) 0.083% nebulizer solution Commonly known as:  PROVENTIL Take 3 mLs (2.5 mg total) by nebulization every 6 (six) hours as needed for wheezing or shortness of breath.   amoxicillin-clavulanate 875-125 MG tablet Commonly known as:  AUGMENTIN Take 1 tablet by mouth 2 (two) times daily.   baclofen 10 MG tablet Commonly known as:  LIORESAL Take 1 tablet (10 mg total) by mouth at bedtime.   benzonatate 200 MG capsule Commonly known as:  TESSALON Take 1 capsule (200 mg total) by mouth 3 (three) times daily as needed for cough.   DEXILANT 60 MG capsule Generic drug:  dexlansoprazole TAKE 1 CAPSULE DAILY   fexofenadine 180 MG tablet Commonly known as:  ALLEGRA Take 180 mg by mouth daily.   FLUTTER Devi Use as directed.   Gabapentin 300 MG/6ML Soln Take 2 mLs by mouth at bedtime.   PREMARIN 0.3 MG tablet Generic drug:  estrogens (conjugated) Take 0.3 mg by mouth daily.   sodium chloride HYPERTONIC 3 % nebulizer solution Take by nebulization as needed for other.   sucralfate 1 GM/10ML suspension Commonly known as:  CARAFATE Take 10 mLs (1 g total) by mouth every 6 (six) hours as needed.   TYLENOL EXTRA STRENGTH PO Take 350 mg by mouth 3 (three) times daily as needed.   VIACTIV MULTI-VITAMIN Chew Chew 1 tablet by mouth 4 (four) times daily.   VITAMIN B1 PO Take by mouth.       Past Surgical History:  She  has a past surgical history that includes Cholecystectomy (2005); Breast lumpectomy; Total abdominal hysterectomy; Lung biopsy; Cataract extraction; Colonoscopy; Upper gastrointestinal  endoscopy; Esophageal manometry (N/A, 10/06/2015); 24 hour ph study (N/A, 10/13/2015); and Esophageal manometry (N/A, 03/10/2018).  Family History:  Her family history includes Cancer in her maternal grandmother; Heart disease in her father and paternal grandfather; Leukemia in her paternal grandmother; Lung cancer in her paternal uncle; Lymphoma in her mother.  Social History:  She  reports that she quit smoking about 49 years ago. Her smoking use included cigarettes. She has never used smokeless tobacco. She reports that she does not drink alcohol or use drugs.

## 2018-08-29 DIAGNOSIS — R300 Dysuria: Secondary | ICD-10-CM | POA: Diagnosis not present

## 2018-08-29 DIAGNOSIS — N898 Other specified noninflammatory disorders of vagina: Secondary | ICD-10-CM | POA: Diagnosis not present

## 2018-08-29 DIAGNOSIS — J471 Bronchiectasis with (acute) exacerbation: Secondary | ICD-10-CM | POA: Diagnosis not present

## 2018-09-01 ENCOUNTER — Telehealth: Payer: Self-pay | Admitting: Pulmonary Disease

## 2018-09-01 ENCOUNTER — Other Ambulatory Visit: Payer: Medicare HMO

## 2018-09-01 NOTE — Telephone Encounter (Signed)
The neb solutions were printed out and sent to DME the day the order was placed for the neb machine 08/28/18.  Attempted to call pt but unable to reach her. Left message for pt to return call.

## 2018-09-01 NOTE — Telephone Encounter (Signed)
Called and spoke with patient regarding her neb meds and machine with supplies Advised her that we are waiting on a fax from [facility] She verbalized understanding at time of call.  Called [facility] letting them know we are needing fax resent, nothing rec'd time of call. rec'd fax from [facility] for neb meds and supplies. Dx bronchiectasis with acute exacerbations TP signed these forms as VS is out of the office today. Faxed forms to [facility] at [phone]  LVM for pt to return phone call regarding this info above.

## 2018-09-05 NOTE — Telephone Encounter (Signed)
Attempted to contact pt. I did not receive an answer. I have left a message for pt to return our call.  

## 2018-09-06 NOTE — Telephone Encounter (Signed)
Patient returning [place] phone call.  Patient number is [phone].

## 2018-09-06 NOTE — Telephone Encounter (Signed)
Called and spoke with pt to see if she had received her neb machine, supplies and neb meds yet  Pt stated she had received them and has been able to use her machine.  Nothing further needed.

## 2018-09-11 ENCOUNTER — Encounter: Payer: Self-pay | Admitting: Gastroenterology

## 2018-09-25 ENCOUNTER — Encounter: Payer: Self-pay | Admitting: Pulmonary Disease

## 2018-09-25 ENCOUNTER — Ambulatory Visit (INDEPENDENT_AMBULATORY_CARE_PROVIDER_SITE_OTHER): Payer: Medicare HMO | Admitting: Pulmonary Disease

## 2018-09-25 VITALS — BP 110/72 | HR 69 | Ht 63.0 in | Wt 88.0 lb

## 2018-09-25 DIAGNOSIS — J432 Centrilobular emphysema: Secondary | ICD-10-CM | POA: Diagnosis not present

## 2018-09-25 DIAGNOSIS — J479 Bronchiectasis, uncomplicated: Secondary | ICD-10-CM | POA: Diagnosis not present

## 2018-09-25 NOTE — Progress Notes (Signed)
[name]  Chief Complaint  Patient presents with  . Follow-up    Pt is doing well overall.Pt doesn't want to con't using nebulizer daily, would like to talk about this concern more in depth.    Constitutional:  BP 110/72 (BP Location: Left Arm, Cuff Size: Normal)   Pulse 69   Ht 5\' 3"  (1.6 m)   Wt 88 lb (39.9 kg)   SpO2 95%   BMI 15.59 kg/m   Past Medical History:  OA, Esophageal dysmotility, Nutcracker esophagus, Gastroparesis, GERD, Iron deficiency anemia, Neuropathy, Raynaud's  Brief Summary:  [name] is a 77 y.o. female former smoker with bronchiectasis, recurrent aspiration pneumonia with history of reflux, emphysema and history of cryptogenic organizing pneumonia.  She is doing better.  Still has some cough and left side chest soreness.  Her energy level is better.  She is keeping up with activities better.  She never got albuterol.  She used nebulized saline.  This helped bring up phlegm, but then made her feel like she was flying.  She has been using mucinex also and this has helped.  Had significant improvement after using augmentin.  Still has sinus congestion and post nasal drip.  Not having fever, hemoptysis, skin rash, or leg swelling.  Physical Exam:   Appearance - well kempt, thin  ENMT - mild maxillary sinus tenderness b/l, no nasal discharge, no oral exudate  Neck - no masses, trachea midline, no thyromegaly, no elevation in JVP  Respiratory - normal appearance of chest wall, normal respiratory effort w/o accessory muscle use, no dullness on percussion, no tactile fremitus, no wheezing or rales  CV - s1s2 regular rate and rhythm, no murmurs, no peripheral edema  GI - soft, non tender, no masses  Lymph - no adenopathy noted in neck and axillary areas  MSK - normal gait  Ext - no cyanosis, clubbing, or joint inflammation noted  Skin - no rashes, lesions, or ulcers  Neuro - normal strength, oriented x  3  Psych - normal mood and affect   Discussion:  She has bronchiectasis with recurrent pneumonias in setting of gastroesophageal reflux.  She has recurrent episodes of respiratory infection with increased sputum production.  This contributes to intermittent episodes of shortness of breath and fatigue.  Her CT chest findings represent varying degrees of infection and mucus plugging.  I am not concerned about malignancy at this point.  She has good response thus far with antibiotic therapy.  Assessment/Plan:   Bronchiectasis. - prn nebulized saline - continue mucinex - hold off on starting albuterol - monitor symptoms, and then determine when she needs another round of ABx - defer f/u CT chest imaging until she has change in clinical status  GERD with dysphagia. - followed by Dr. Havery Moros with GI   Patient Instructions  Follow up in 3 months   Chesley Mires, MD [place] Pager: [phone] 09/25/2018, 11:57 AM  Flow Sheet     Pulmonary tests:  PFT 12/31/09 >> FEV1 1.84(84%), FEV1% 73, TLC 4.94(98%), DLCO 77%, +BD. PFT 06/21/16 >> FEV1 1.22 (55%), FEV1% 76, TLC 5.29 (104%), DLCO 43% PFT 12/21/16 >> FEV1 1.49 (69%), FEV1% 78, TLC 5.42 (107%), RV 158%, DLCO 51%  Chest imaging:  CT chest 08/17/10 >> mild biapical scarring, basilar peribronchovascular nodularity and mild bronchiectasis HRCT chest 11/28/17 >> mild centrilobular emphysema, peribronchovascular nodularity, mild BTX, LLL consolidation CT chest 03/02/18 >> mild emphysema, areas of BTX, several nodular areas up to  1.4 cm CT chest 08/25/18 >> centrilobular emphysema, BTX with wall thickening and tree in bud RML/lingula/bilateral lower lobes, unchanged opacity LLL, new patchy opacities LUL  (reviewed by me)  Cardiac tests:  Echo9/01/17 >> EF 60 to 65%, grade 2 DD, mild MR, small PFO, PAS 27 mmHg   Medications:   Allergies as of 09/25/2018      Reactions   Cortisone    unsure   Prednisone     Sulfonamide Derivatives    Unsure of reaction      Medication List        Accurate as of 09/25/18 11:57 AM. Always use your most recent med list.          baclofen 10 MG tablet Commonly known as:  LIORESAL Take 1 tablet (10 mg total) by mouth at bedtime.   benzonatate 200 MG capsule Commonly known as:  TESSALON Take 1 capsule (200 mg total) by mouth 3 (three) times daily as needed for cough.   DEXILANT 60 MG capsule Generic drug:  dexlansoprazole TAKE 1 CAPSULE DAILY   fexofenadine 180 MG tablet Commonly known as:  ALLEGRA Take 180 mg by mouth daily.   FLUTTER Devi Use as directed.   Gabapentin 300 MG/6ML Soln Take 2 mLs by mouth at bedtime.   PREMARIN 0.3 MG tablet Generic drug:  estrogens (conjugated) Take 0.3 mg by mouth daily.   sodium chloride HYPERTONIC 3 % nebulizer solution Take by nebulization as needed for other.   sucralfate 1 GM/10ML suspension Commonly known as:  CARAFATE Take 10 mLs (1 g total) by mouth every 6 (six) hours as needed.   TYLENOL EXTRA STRENGTH PO Take 350 mg by mouth 3 (three) times daily as needed.   VIACTIV MULTI-VITAMIN Chew Chew 1 tablet by mouth 4 (four) times daily.   VITAMIN B1 PO Take by mouth.       Past Surgical History:  She  has a past surgical history that includes Cholecystectomy (2005); Breast lumpectomy; Total abdominal hysterectomy; Lung biopsy; Cataract extraction; Colonoscopy; Upper gastrointestinal endoscopy; Esophageal manometry (N/A, 10/06/2015); 24 hour ph study (N/A, 10/13/2015); and Esophageal manometry (N/A, 03/10/2018).  Family History:  Her family history includes Cancer in her maternal grandmother; Heart disease in her father and paternal grandfather; Leukemia in her paternal grandmother; Lung cancer in her paternal uncle; Lymphoma in her mother.  Social History:  She  reports that she quit smoking about 49 years ago. Her smoking use included cigarettes. She has never used smokeless tobacco. She  reports that she does not drink alcohol or use drugs.

## 2018-09-25 NOTE — Patient Instructions (Signed)
Follow up in 3 months

## 2018-09-26 DIAGNOSIS — H35372 Puckering of macula, left eye: Secondary | ICD-10-CM | POA: Diagnosis not present

## 2018-09-26 DIAGNOSIS — Z961 Presence of intraocular lens: Secondary | ICD-10-CM | POA: Diagnosis not present

## 2018-09-26 DIAGNOSIS — H52203 Unspecified astigmatism, bilateral: Secondary | ICD-10-CM | POA: Diagnosis not present

## 2018-09-26 DIAGNOSIS — D3132 Benign neoplasm of left choroid: Secondary | ICD-10-CM | POA: Diagnosis not present

## 2018-09-29 DIAGNOSIS — J471 Bronchiectasis with (acute) exacerbation: Secondary | ICD-10-CM | POA: Diagnosis not present

## 2018-10-03 ENCOUNTER — Other Ambulatory Visit: Payer: Self-pay | Admitting: Pulmonary Disease

## 2018-10-04 ENCOUNTER — Ambulatory Visit: Payer: Medicare HMO | Admitting: Gastroenterology

## 2018-10-06 DIAGNOSIS — R040 Epistaxis: Secondary | ICD-10-CM | POA: Diagnosis not present

## 2018-10-06 DIAGNOSIS — Z1389 Encounter for screening for other disorder: Secondary | ICD-10-CM | POA: Diagnosis not present

## 2018-10-06 DIAGNOSIS — Z0000 Encounter for general adult medical examination without abnormal findings: Secondary | ICD-10-CM | POA: Diagnosis not present

## 2018-10-06 DIAGNOSIS — E441 Mild protein-calorie malnutrition: Secondary | ICD-10-CM | POA: Diagnosis not present

## 2018-10-06 DIAGNOSIS — K9089 Other intestinal malabsorption: Secondary | ICD-10-CM | POA: Diagnosis not present

## 2018-10-06 DIAGNOSIS — D509 Iron deficiency anemia, unspecified: Secondary | ICD-10-CM | POA: Diagnosis not present

## 2018-10-06 DIAGNOSIS — J8410 Pulmonary fibrosis, unspecified: Secondary | ICD-10-CM | POA: Diagnosis not present

## 2018-10-06 DIAGNOSIS — G603 Idiopathic progressive neuropathy: Secondary | ICD-10-CM | POA: Diagnosis not present

## 2018-10-06 DIAGNOSIS — Z79899 Other long term (current) drug therapy: Secondary | ICD-10-CM | POA: Diagnosis not present

## 2018-10-06 DIAGNOSIS — J479 Bronchiectasis, uncomplicated: Secondary | ICD-10-CM | POA: Diagnosis not present

## 2018-10-06 DIAGNOSIS — K219 Gastro-esophageal reflux disease without esophagitis: Secondary | ICD-10-CM | POA: Diagnosis not present

## 2018-10-09 DIAGNOSIS — Z79899 Other long term (current) drug therapy: Secondary | ICD-10-CM | POA: Diagnosis not present

## 2018-10-09 DIAGNOSIS — K9089 Other intestinal malabsorption: Secondary | ICD-10-CM | POA: Diagnosis not present

## 2018-10-11 ENCOUNTER — Other Ambulatory Visit: Payer: Self-pay | Admitting: Neurology

## 2018-10-16 ENCOUNTER — Telehealth: Payer: Self-pay | Admitting: Neurology

## 2018-10-16 MED ORDER — GABAPENTIN 300 MG/6ML PO SOLN: 2.0000 mL | Freq: Every day | ORAL | 0 refills | Status: DC

## 2018-10-16 NOTE — Telephone Encounter (Signed)
Patient called and left a vm about the gabapentin refill that needed to be sent to the [facility]. Please send!

## 2018-10-16 NOTE — Telephone Encounter (Signed)
Gabapentin 300 mg/34mL solution Sig = take 21mL QHS was sent to [facility] at 9:24 AM

## 2018-10-16 NOTE — Telephone Encounter (Signed)
Patient called and is needing to have her Gabapentin refilled until her February appointment? She said that the next few weeks are going to be busy at work on her feet and she said the Gabapentin helps the nerves. She uses [facility] in [place] 90 day supply. Please Call. Thanksl

## 2018-10-16 NOTE — Telephone Encounter (Signed)
Gabapentin 300 mg/60mL solution Sig = take 43mL QHS   Sent tto [facility]

## 2018-10-25 ENCOUNTER — Telehealth: Payer: Self-pay | Admitting: Gastroenterology

## 2018-10-25 NOTE — Telephone Encounter (Signed)
Patient wants to speak to nurse regarding a medication she is wanting to try.

## 2018-10-25 NOTE — Telephone Encounter (Signed)
Left message on machine to call back  

## 2018-10-26 NOTE — Telephone Encounter (Signed)
Unable to reach pt.  Left message on machine to call back will wait for further communication from the pt

## 2018-10-27 ENCOUNTER — Other Ambulatory Visit: Payer: Self-pay

## 2018-10-27 MED ORDER — SUCRALFATE 1 GM/10ML PO SUSP: 1.0000 g | Freq: Four times a day (QID) | ORAL | 1 refills | Status: DC | PRN

## 2018-10-29 DIAGNOSIS — J471 Bronchiectasis with (acute) exacerbation: Secondary | ICD-10-CM | POA: Diagnosis not present

## 2018-11-13 ENCOUNTER — Other Ambulatory Visit: Payer: Self-pay

## 2018-11-13 ENCOUNTER — Encounter: Payer: Self-pay | Admitting: Neurology

## 2018-11-13 ENCOUNTER — Ambulatory Visit (INDEPENDENT_AMBULATORY_CARE_PROVIDER_SITE_OTHER): Payer: Medicare HMO | Admitting: Neurology

## 2018-11-13 VITALS — BP 110/76 | HR 62 | Ht 63.0 in | Wt 83.0 lb

## 2018-11-13 DIAGNOSIS — G629 Polyneuropathy, unspecified: Secondary | ICD-10-CM | POA: Diagnosis not present

## 2018-11-13 DIAGNOSIS — I872 Venous insufficiency (chronic) (peripheral): Secondary | ICD-10-CM | POA: Diagnosis not present

## 2018-11-13 MED ORDER — GABAPENTIN 300 MG/6ML PO SOLN: 2.0000 mL | Freq: Every day | ORAL | 11 refills | Status: DC

## 2018-11-13 NOTE — Patient Instructions (Addendum)
1. Refer to Podiatry 2. Refer to Hand Surgery (Dr. Amedeo Plenty) for ulnar neuropathy 3. Please contact Vascular and Vein specialist (Dr. Adele Barthel) for follow-up at [phone] 4. Continue low dose gabapentin 5. Follow-up in 7-8 months, call for any changes

## 2018-11-13 NOTE — Progress Notes (Signed)
NEUROLOGY FOLLOW UP OFFICE NOTE  [name] [id number]  DOB: 1940-12-09  HISTORY OF PRESENT ILLNESS: I had the pleasure of seeing [name] in follow-up in the neurology clinic on 11/13/2018.  The patient was last seen 75 months ago for neuropathy. She has neuropathy with constant burning and numbness in both legs. She also has chronic venous insufficiency, duplex showed limited valvular insufficiency. She still uses her compression stockings. On her last visit, she was noticing hand weakness, dropping things more. She had an EMG/NCV of the right upper and lower extremity which confirmed moderate chronic sensorimotor axonal polyneuropathy. There was also note of moderate right ulnar neuropathy with slowing across the elbow and mild right median neuropathy consistent with carpal tunnel syndrome. Since her last visit, she reports that she is dropping things more from both hands. She has tingling in her right hand. She continues to have numbness in both feet, reporting the right foot is "dead." She hit her left leg on the coffee table more than 3 weeks ago and has a slow-healing abrasion on the inner calf that is causing a lot of stinging pain. She is only on a very low dose of liquid gabapentin ("I just taste it with my tongue"), higher dose caused grogginess. Similar side effects with Lyrica trial. She was initially seen for transient aphasia, none since 2017. She denies any headaches, dizziness, vision changes, dysarthria/dysphagia.   HPI: This is a pleasant 77 yo RH woman with a history of GERD, nutcracker esophagus, pulmonary fibrosis, in her usual state of health until early June 2017 while walking into a grocery she suddenly had left temporal pain, then it felt like she was hit on both side of the head with a hammer. She reports she was "hit like gangbusters," she was very dizzy with a spinning sensation, nauseated. She held on to the cart for balance and had difficulty concentrating. Her vision  was blurred. She managed to finish her chore and walked to the car, then noticed that she was having a hard time talking. She could see the sentence broken up in her head and could not get the words out. She was able to drive home with no focal symptoms noted. The episode lasted 30 minutes or so, she felt weak and tired after. Since then she has had at least 2 or 3 more episodes of headaches with dizziness, which did not progress to speech difficulties. The symptoms would last around 5 minutes, no clear positional component to the dizziness. When she feels it coming on, she massages her head, which seems to help. She was unsure if Tylenol helped. She denies any prior history of headaches. She denies any vision loss, photo/phonophobia, diplopia, dysarthria, jaw claudication, hearing loss. She has occasional pulsatile tinnitus more in the afternoons. She has GERD which makes swallowing difficult. She has a history of neuropathy affecting both feet, R>L, with burning and numbness/tingling for several years. She has been doing laser therapy for venous insufficiency/varicose veins. She denies any falls but trips more. Sleep varies. She is very active and rides her bike for a couple of hours daily. She works at a preschool. Her mother had a stroke at age 77.   PAST MEDICAL HISTORY: Past Medical History:  Diagnosis Date  . Allergy   . Arthritis   . BOOP (bronchiolitis obliterans with organizing pneumonia) ([place])   . Bronchiectasis   . Esophageal dysmotility   . Gastroparesis   . GERD (gastroesophageal reflux disease)    severe  .  Iron deficiency anemia   . Neuropathy   . Nutcracker esophagus   . Pneumonia   . Raynaud's disease   . Right lower lobe pneumonia December 2013    MEDICATIONS:  Outpatient Encounter Medications as of 11/13/2018  Medication Sig  . Acetaminophen (TYLENOL EXTRA STRENGTH PO) Take 350 mg by mouth 3 (three) times daily as needed.   . baclofen (LIORESAL) 10 MG tablet Take 1 tablet  (10 mg total) by mouth at bedtime.  . benzonatate (TESSALON) 200 MG capsule Take 1 capsule (200 mg total) by mouth 3 (three) times daily as needed for cough.  . DEXILANT 60 MG capsule TAKE 1 CAPSULE DAILY  . fexofenadine (ALLEGRA) 180 MG tablet Take 180 mg by mouth daily.  . Gabapentin 300 MG/6ML SOLN Take 2 mLs by mouth at bedtime.  . montelukast (SINGULAIR) 10 MG tablet TAKE 1 TABLET AT BEDTIME  . Multiple Vitamins-Calcium (VIACTIV MULTI-VITAMIN) CHEW Chew 1 tablet by mouth 4 (four) times daily.    [name] PREMARIN 0.3 MG tablet Take 0.3 mg by mouth daily.   [name] Respiratory Therapy Supplies (FLUTTER) DEVI Use as directed.  . sodium chloride HYPERTONIC 3 % nebulizer solution Take by nebulization as needed for other.  . sucralfate (CARAFATE) 1 GM/10ML suspension Take 10 mLs (1 g total) by mouth every 6 (six) hours as needed.  . Thiamine Mononitrate (VITAMIN B1 PO) Take by mouth.   No facility-administered encounter medications on file as of 11/13/2018.     ALLERGIES: Allergies  Allergen Reactions  . Cortisone     unsure  . Prednisone   . Sulfonamide Derivatives     Unsure of reaction    FAMILY HISTORY: Family History  Problem Relation Age of Onset  . Lymphoma Mother   . Heart disease Father   . Lung cancer Paternal Uncle   . Heart disease Paternal Grandfather   . Leukemia Paternal Grandmother   . Cancer Maternal Grandmother        unknown type  . Colon cancer Neg Hx   . Esophageal cancer Neg Hx   . Rectal cancer Neg Hx   . Stomach cancer Neg Hx     SOCIAL HISTORY: Social History   Socioeconomic History  . Marital status: Widowed    Spouse name: Not on file  . Number of children: 2  . Years of education: Not on file  . Highest education level: Not on file  Occupational History  . Occupation: retired Pharmacist, hospital    Comment: still teaching pre [employer]  . Financial resource strain: Not on file  . Food insecurity:    Worry: Not on file    Inability: Not on file  .  Transportation needs:    Medical: Not on file    Non-medical: Not on file  Tobacco Use  . Smoking status: Former Smoker    Types: Cigarettes    Last attempt to quit: 11/15/1968    Years since quitting: 50.0  . Smokeless tobacco: Never Used  . Tobacco comment: 3 cigs a day  Substance and Sexual Activity  . Alcohol use: No  . Drug use: No  . Sexual activity: Not on file  Lifestyle  . Physical activity:    Days per week: Not on file    Minutes per session: Not on file  . Stress: Not on file  Relationships  . Social connections:    Talks on phone: Not on file    Gets together: Not on file  Attends religious service: Not on file    Active member of club or organization: Not on file    Attends meetings of clubs or organizations: Not on file    Relationship status: Not on file  . Intimate partner violence:    Fear of current or ex partner: Not on file    Emotionally abused: Not on file    Physically abused: Not on file    Forced sexual activity: Not on file  Other Topics Concern  . Not on file  Social History Narrative   Still working as a Print production planner at [employer].     REVIEW OF SYSTEMS: Constitutional: No fevers, chills, or sweats, no generalized fatigue, change in appetite Eyes: No visual changes, double vision, eye pain Ear, nose and throat: No hearing loss, ear pain, nasal congestion, sore throat Cardiovascular: No chest pain, palpitations Respiratory:  No shortness of breath at rest or with exertion, wheezes GastrointestinaI: No nausea, vomiting, diarrhea, abdominal pain, fecal incontinence Genitourinary:  No dysuria, urinary retention or frequency Musculoskeletal:  + neck pain, back pain Integumentary: No rash, pruritus, skin lesions Neurological: as above Psychiatric: No depression, insomnia, anxiety Endocrine: No palpitations, fatigue, diaphoresis, mood swings, change in appetite, change in weight, increased thirst Hematologic/Lymphatic:  No anemia,  purpura, petechiae. Allergic/Immunologic: no itchy/runny eyes, nasal congestion, recent allergic reactions, rashes  PHYSICAL EXAM: Vitals:   11/13/18 1613  BP: 110/76  Pulse: 62  SpO2: 98%   General: No acute distress Head:  Normocephalic/atraumatic Neck: supple, no paraspinal tenderness, full range of motion Heart:  Regular rate and rhythm Lungs:  Clear to auscultation bilaterally Back: No paraspinal tenderness Skin/Extremities: No rash, no edema, +varicose veins, bluish discoloration in both feet Neurological Exam: alert and oriented to person, place, and time. No aphasia or dysarthria. Fund of knowledge is appropriate.  Recent and remote memory are intact.  Attention and concentration are normal.    Able to name objects and repeat phrases. Cranial nerves: Pupils equal, round, reactive to light. Extraocular movements intact with no nystagmus. Visual fields full. Facial sensation intact. No facial asymmetry. Tongue, uvula, palate midline.  Motor: Bulk and tone normal, muscle strength 5/5 proximally on both UE, 4/5 distally except for 3+/5 bilateral APB right 5th digit abduction, 5/5 bilateral proximal UE, 4/5 right foot dorsiflexion, 5/5 plantar flexion, 3/5 bilateral eversion/inversion. Sensation decreased to pin above wrists bilaterally, decreased pin to mid-calf bilaterally, decreased cold on both feet, decreased vibration to knees bilaterally (similar to prior). Deep tendon reflexes +1 throughout, toes downgoing.  Finger to nose testing intact.  Gait slow and cautious, difficulty with tandem walk. Romberg negative. +Tinel sign at left wrist and elbow  IMPRESSION: This is a pleasant 78 yo RH woman with a history of GERD, nutcracker esophagus, pulmonary fibrosis, initially seen for transient episode of headache, vertigo, and expressive aphasia, MRI brain no acute changes. Her MRA head and neck was unremarkable except for moderate stenosis of the left P1 and diminished distal flow-related  signal within the left PCA distribution. No further episodes since June 2017, continue daily aspirin and control of vascular risk factors. She is having worsening neuropathy, with note of foot weakness. We discussed another course of physical therapy, continue to monitor driving, if unable to step/feel pedals, would stop driving. She is dropping things more with both hands, R>L, EMG had shown moderate right ulnar neuropathy and mild right CTS, she is interested in seeing Hand Surgery. She is also reporting a slow-healing wound in her left calf,  she has venous insufficiency and will follow-up with Vascular. She is requesting a referral to Podiatry for ?bunion. Continue low dose gabapentin, she is not interested in other neuropathic pain medication. Follow-up in 7-8 months, she knows to call for any changes.   Thank you for allowing me to participate in her care.  Please do not hesitate to call for any questions or concerns.  The duration of this appointment visit was 30 minutes of face-to-face time with the patient.  Greater than 50% of this time was spent in counseling, explanation of diagnosis, planning of further management, and coordination of care.   Ellouise Newer, M.D.   CC: Dr. Felipa Eth

## 2018-11-14 ENCOUNTER — Encounter: Payer: Self-pay | Admitting: Neurology

## 2018-11-16 ENCOUNTER — Ambulatory Visit (INDEPENDENT_AMBULATORY_CARE_PROVIDER_SITE_OTHER): Payer: Medicare HMO | Admitting: Gastroenterology

## 2018-11-16 ENCOUNTER — Other Ambulatory Visit: Payer: Self-pay

## 2018-11-16 ENCOUNTER — Encounter

## 2018-11-16 ENCOUNTER — Encounter: Payer: Self-pay | Admitting: Gastroenterology

## 2018-11-16 VITALS — BP 110/68 | HR 64 | Ht 65.0 in | Wt 85.5 lb

## 2018-11-16 DIAGNOSIS — J479 Bronchiectasis, uncomplicated: Secondary | ICD-10-CM

## 2018-11-16 DIAGNOSIS — G629 Polyneuropathy, unspecified: Secondary | ICD-10-CM

## 2018-11-16 DIAGNOSIS — K219 Gastro-esophageal reflux disease without esophagitis: Secondary | ICD-10-CM

## 2018-11-16 DIAGNOSIS — Z86010 Personal history of colonic polyps: Secondary | ICD-10-CM | POA: Diagnosis not present

## 2018-11-16 DIAGNOSIS — R1310 Dysphagia, unspecified: Secondary | ICD-10-CM

## 2018-11-16 MED ORDER — SUPREP BOWEL PREP KIT 17.5-3.13-1.6 GM/177ML PO SOLN: ORAL | 0 refills | Status: DC

## 2018-11-16 MED ORDER — BACLOFEN 5 MG PO TABS: 5.0000 mg | ORAL_TABLET | Freq: Every day | ORAL | 3 refills | Status: DC

## 2018-11-16 NOTE — Patient Instructions (Addendum)
If you are age 76 or older, your body mass index should be between 23-30. Your Body mass index is 14.23 kg/m. If this is out of the aforementioned range listed, please consider follow up with your Primary Care Provider.  If you are age 22 or younger, your body mass index should be between 19-25. Your Body mass index is 14.23 kg/m. If this is out of the aformentioned range listed, please consider follow up with your Primary Care Provider.   You have been scheduled for a colonoscopy. Please follow written instructions given to you at your visit today.  Please pick up your prep supplies at the pharmacy within the next 1-3 days. If you use inhalers (even only as needed), please bring them with you on the day of your procedure. Your physician has requested that you go to www.startemmi.com and enter the access code given to you at your visit today. This web site gives a general overview about your procedure. However, you should still follow specific instructions given to you by our office regarding your preparation for the procedure.  We have sent the following medications to your pharmacy for you to pick up at your convenience: Baclofen 5mg : Take daily at bedtime  Continue taking Dexilant.   Thank you for entrusting me with your care and for choosing [facility], Dr. [place] Cellar

## 2018-11-16 NOTE — Progress Notes (Signed)
HPI :  78 year old female here for reassessment. She is known to me for history of severe reflux, also with a history of chronic bronchiectasis, followed by pulmonary.  She's had an extensive workup for her reflux with [place] in the past as outlined: EGD 08/2015 - 1cm HH, gastritis without ulceration, HP negative pH study done in 2016 showing Demeester score of 152.7 but only symptom index of 23% for reflux. Most of reflux was weakly acidic. Esophageal manometry showed what was thought to be nutcracker esophagus at the time Esophogram 10/10/2017  - small sliding type hiatal hernia, no distal stricture / mass, normal motility Repeat manometry 03/10/2018 - hypercontractile esophagus, no other significant pathology  She remains on Dexilant 60 mg once a day for her reflux symptoms. This definitely helps and prevents severe symptoms, however she continues to have significant breakthrough on a daily basis which bothers her, both during daytime and night. I provided some liquid Carafate to use as needed, she states this does help when she takes it. I had also recommended a trial of baclofen at night for nocturnal reflux. She is concerned about the sedating effects of it and did not try it. I had her follow up with Dr. Hassell Done of general surgery to discuss possible Nissen fundoplication. After her visit with him, she was concerned about the risks of surgery and wanted to hold off on that.  She otherwise has some periodic dysphagia to solids. No focal stricture noted on prior EGD and esophagram. Unclear if this is due to hypercontractile esophagus. She's been tried on a calcium channel blocker (diltiazem) which did not help. Discussed a TCA, she was fearful of side effects and declined.   Otherwise she had a colonoscopy done over 3 years ago for positive: Guard. She is noted to have 5 small adenomas on that exam and is due for surveillance colonoscopy at this time. She has chronic loose stools, roughly 3-4 bowel  movements per day. She denies any blood in her stools. She reports symptoms ongoing for a long time. She has not wished to take any medications for this. She has a history of cholecystectomy.   She had a capsule endoscopy 11/22/2016which other than a diminutive red spot was normal. No pathology noted to have caused her anemia. Her Hgb had normalized on oral iron.     Past Medical History:  Diagnosis Date  . Allergy   . Arthritis   . BOOP (bronchiolitis obliterans with organizing pneumonia) ([place])   . Bronchiectasis   . Esophageal dysmotility   . Gastroparesis   . GERD (gastroesophageal reflux disease)    severe  . Iron deficiency anemia   . Neuropathy   . Nutcracker esophagus   . Pneumonia   . Raynaud's disease   . Right lower lobe pneumonia December 2013     Past Surgical History:  Procedure Laterality Date  . [place] STUDY N/A 10/13/2015   Procedure: [place] STUDY;  Surgeon: Gatha Mayer, MD;  Location: [facility];  Service: Endoscopy;  Laterality: N/A;  . BREAST LUMPECTOMY     left, x 2  . CATARACT EXTRACTION     both eyes  . CHOLECYSTECTOMY  2005  . COLONOSCOPY    . ESOPHAGEAL MANOMETRY N/A 10/06/2015   Procedure: ESOPHAGEAL MANOMETRY (EM);  Surgeon: Manus Gunning, MD;  Location: [facility];  Service: Gastroenterology;  Laterality: N/A;  . ESOPHAGEAL MANOMETRY N/A 03/10/2018   Procedure: ESOPHAGEAL MANOMETRY (EM);  Surgeon: Mauri Pole, MD;  Location: [facility];  Service: Endoscopy;  Laterality: N/A;  . LUNG BIOPSY     vats right 2003  . TOTAL ABDOMINAL HYSTERECTOMY    . UPPER GASTROINTESTINAL ENDOSCOPY     Family History  Problem Relation Age of Onset  . Lymphoma Mother   . Heart disease Father   . Lung cancer Paternal Uncle   . Heart disease Paternal Grandfather   . Leukemia Paternal Grandmother   . Cancer Maternal Grandmother        unknown type  . Colon cancer Neg Hx   . Esophageal cancer Neg Hx   . Rectal cancer Neg Hx     . Stomach cancer Neg Hx    Social History   Tobacco Use  . Smoking status: Former Smoker    Types: Cigarettes    Last attempt to quit: 11/15/1968    Years since quitting: 50.0  . Smokeless tobacco: Never Used  . Tobacco comment: 3 cigs a day  Substance Use Topics  . Alcohol use: No  . Drug use: No   Current Outpatient Medications  Medication Sig Dispense Refill  . Acetaminophen (TYLENOL EXTRA STRENGTH PO) Take 350 mg by mouth 3 (three) times daily as needed.     . baclofen (LIORESAL) 10 MG tablet Take 1 tablet (10 mg total) by mouth at bedtime. 30 each 1  . benzonatate (TESSALON) 200 MG capsule Take 1 capsule (200 mg total) by mouth 3 (three) times daily as needed for cough. 30 capsule 2  . DEXILANT 60 MG capsule TAKE 1 CAPSULE DAILY 90 capsule 3  . fexofenadine (ALLEGRA) 180 MG tablet Take 180 mg by mouth daily.    . Gabapentin 300 MG/6ML SOLN Take 2 mLs by mouth at bedtime. 470 mL 11  . montelukast (SINGULAIR) 10 MG tablet TAKE 1 TABLET AT BEDTIME 90 tablet 1  . Multiple Vitamins-Calcium (VIACTIV MULTI-VITAMIN) CHEW Chew 1 tablet by mouth 4 (four) times daily.      [name] PREMARIN 0.3 MG tablet Take 0.3 mg by mouth daily.     [name] Respiratory Therapy Supplies (FLUTTER) DEVI Use as directed. 1 each 0  . sodium chloride HYPERTONIC 3 % nebulizer solution Take by nebulization as needed for other. 750 mL 12  . sucralfate (CARAFATE) 1 GM/10ML suspension Take 10 mLs (1 g total) by mouth every 6 (six) hours as needed. 420 mL 1  . Thiamine Mononitrate (VITAMIN B1 PO) Take by mouth.     No current facility-administered medications for this visit.    Allergies  Allergen Reactions  . Cortisone     unsure  . Prednisone   . Sulfonamide Derivatives     Unsure of reaction     Review of Systems: All systems reviewed and negative except where noted in HPI.   Lab Results  Component Value Date   WBC 7.4 04/28/2017   HGB 11.6 (L) 04/28/2017   HCT 36.4 04/28/2017   MCV 86.4 04/28/2017   PLT  392.0 04/28/2017    Lab Results  Component Value Date   CREATININE 0.64 04/28/2017   BUN 11 04/28/2017   NA 138 04/28/2017   K 4.4 04/28/2017   CL 104 04/28/2017   CO2 30 04/28/2017    Lab Results  Component Value Date   ALT 28 04/28/2017   AST 29 04/28/2017   ALKPHOS 65 04/28/2017   BILITOT 0.3 04/28/2017     Physical Exam: BP 110/68   Pulse 64   Ht 5\' 5"  (1.651 m)  Wt 85 lb 8 oz (38.8 kg)   BMI 14.23 kg/m  Constitutional: Pleasant, female in no acute distress. HEENT: Normocephalic and atraumatic. Conjunctivae are normal. No scleral icterus. Neck supple.  Cardiovascular: Normal rate, regular rhythm.  Pulmonary/chest: Effort normal and breath sounds normal.  Abdominal: Soft, nondistended, nontender. . There are no masses palpable. No hepatomegaly. Extremities: no edema Lymphadenopathy: No cervical adenopathy noted. Neurological: Alert and oriented to person place and time. Skin: Skin is warm and dry. No rashes noted. Psychiatric: Normal mood and affect. Behavior is normal.   ASSESSMENT AND PLAN: 78 year old female here for reassessment of the following issues:  GERD / dysphagia / bronchiectasis with recurrent pneumonia - she has severe reflux as evidenced by her prior pH study and ongoing symptoms. Despite high-dose Dexilant her symptoms largely persist. I'm concerned her GERD could also be related to her ongoing pulmonary issues and recurrent pneumonia. I referred her for a surgical opinion for Nissen since her last visit which she has since declined. In light of her dysphagia, manometry was repeated and shows a hypercontractile esophagus but no evidence of nutcracker esophagus or achalasia. I discussed options with her moving forward. I previously offered her a trial of baclofen to treat nocturnal reflux and to help with her sleep as she also suffers from insomnia due to symptoms. She is concerned about the side effects from 10 mg dosing. I recommend she try a 5 mg  dose qHS and see if that helps, after this discussion she is willing to try it. I otherwise discussed that we now have an endoscopic reflux procedure (TIF) available in [place] with Dr. Bryan Lemma. She may be a candidate for this. I recommend an EGD at this time to ensure no changes in her anatomy, ensure no worsening of small hiatal hernia and see if she is a candidate for TIF. She is quite interested in TIF and wanted to proceed with endoscopy. If she is deemed to be a candidate I will have her see Dr.Cirigliano for further evaluation for it. She agreed.  History of colon adenomas - Due for surveillance colonoscopy at this time. Discussed risks and benefits of colonoscopy and she wanted to proceed. We'll do this at the same time as her endoscopy.  Wells Cellar, MD [facility]

## 2018-11-17 ENCOUNTER — Telehealth: Payer: Self-pay | Admitting: Neurology

## 2018-11-17 ENCOUNTER — Other Ambulatory Visit: Payer: Self-pay

## 2018-11-17 DIAGNOSIS — I872 Venous insufficiency (chronic) (peripheral): Secondary | ICD-10-CM

## 2018-11-17 NOTE — Telephone Encounter (Signed)
Home # [phone] This afternoon will be available. She is needing to speak with you regarding a referral to [facility]? She has some questions for you. Please Call. Thanks

## 2018-11-20 ENCOUNTER — Telehealth: Payer: Self-pay

## 2018-11-20 ENCOUNTER — Ambulatory Visit (AMBULATORY_SURGERY_CENTER): Payer: Medicare HMO | Admitting: Gastroenterology

## 2018-11-20 ENCOUNTER — Encounter: Payer: Self-pay | Admitting: Gastroenterology

## 2018-11-20 ENCOUNTER — Other Ambulatory Visit: Payer: Self-pay

## 2018-11-20 VITALS — BP 138/85 | HR 67 | Temp 96.6°F | Resp 20 | Ht 65.0 in | Wt 85.0 lb

## 2018-11-20 DIAGNOSIS — D120 Benign neoplasm of cecum: Secondary | ICD-10-CM | POA: Diagnosis not present

## 2018-11-20 DIAGNOSIS — Z86010 Personal history of colonic polyps: Secondary | ICD-10-CM | POA: Diagnosis not present

## 2018-11-20 DIAGNOSIS — D132 Benign neoplasm of duodenum: Secondary | ICD-10-CM | POA: Diagnosis not present

## 2018-11-20 DIAGNOSIS — Z8673 Personal history of transient ischemic attack (TIA), and cerebral infarction without residual deficits: Secondary | ICD-10-CM | POA: Diagnosis not present

## 2018-11-20 DIAGNOSIS — D128 Benign neoplasm of rectum: Secondary | ICD-10-CM

## 2018-11-20 DIAGNOSIS — D129 Benign neoplasm of anus and anal canal: Secondary | ICD-10-CM

## 2018-11-20 DIAGNOSIS — K317 Polyp of stomach and duodenum: Secondary | ICD-10-CM | POA: Diagnosis not present

## 2018-11-20 DIAGNOSIS — K5289 Other specified noninfective gastroenteritis and colitis: Secondary | ICD-10-CM

## 2018-11-20 DIAGNOSIS — K219 Gastro-esophageal reflux disease without esophagitis: Secondary | ICD-10-CM

## 2018-11-20 DIAGNOSIS — K621 Rectal polyp: Secondary | ICD-10-CM | POA: Diagnosis not present

## 2018-11-20 MED ORDER — SODIUM CHLORIDE 0.9 % IV SOLN: 500.0000 mL | Freq: Once | INTRAVENOUS | Status: DC

## 2018-11-20 NOTE — Op Note (Signed)
[facility] Patient Name: [name] Procedure Date: 11/20/2018 3:20 PM MRN: [id number] Endoscopist: Remo Lipps P. Havery Moros , MD Age: 78 Referring MD:  Date of Birth: 13-Apr-1941 Gender: Female Account #: [id number] Procedure:                Colonoscopy Indications:              Surveillance: Personal history of adenomatous                            polyps on last colonoscopy 3 years ago Medicines:                Monitored Anesthesia Care Procedure:                Pre-Anesthesia Assessment:                           - Prior to the procedure, a History and Physical                            was performed, and patient medications and                            allergies were reviewed. The patient's tolerance of                            previous anesthesia was also reviewed. The risks                            and benefits of the procedure and the sedation                            options and risks were discussed with the patient.                            All questions were answered, and informed consent                            was obtained. Prior Anticoagulants: The patient has                            taken no previous anticoagulant or antiplatelet                            agents. ASA Grade Assessment: III - A patient with                            severe systemic disease. After reviewing the risks                            and benefits, the patient was deemed in                            satisfactory condition to undergo the procedure.  After obtaining informed consent, the colonoscope                            was passed under direct vision. Throughout the                            procedure, the patient's blood pressure, pulse, and                            oxygen saturations were monitored continuously. The                            Colonoscope was introduced through the anus and                            advanced to the the  terminal ileum, with                            identification of the appendiceal orifice and IC                            valve. The colonoscopy was performed without                            difficulty. The patient tolerated the procedure                            well. The quality of the bowel preparation was                            adequate. The terminal ileum, ileocecal valve,                            appendiceal orifice, and rectum were photographed. Scope In: 3:37:28 PM Scope Out: 4:01:06 PM Scope Withdrawal Time: 0 hours 18 minutes 49 seconds  Total Procedure Duration: 0 hours 23 minutes 38 seconds  Findings:                 The perianal and digital rectal examinations were                            normal.                           The terminal ileum appeared normal.                           A 3 mm polyp was found in the ileocecal valve. The                            polyp was sessile. The polyp was removed with a                            cold snare. Resection and retrieval were complete.  A 3 mm polyp was found in the rectum. The polyp was                            sessile. The polyp was removed with a cold snare.                            Resection and retrieval were complete.                           The exam was otherwise without abnormality other                            than some tortousity.                           Biopsies for histology were taken with a cold                            forceps from the right colon and left colon for                            evaluation of microscopic colitis given the                            patient's incidental complaints of persistent loose                            stools. Complications:            No immediate complications. Estimated blood loss:                            Minimal. Estimated Blood Loss:     Estimated blood loss was minimal. Impression:               - The examined  portion of the ileum was normal.                           - One 3 mm polyp at the ileocecal valve, removed                            with a cold snare. Resected and retrieved.                           - One 3 mm polyp in the rectum, removed with a cold                            snare. Resected and retrieved.                           - Tortous colon                           - The examination was otherwise normal.                           -  Biopsies were taken with a cold forceps from the                            right colon and left colon for evaluation of                            microscopic colitis. Recommendation:           - Patient has a contact number available for                            emergencies. The signs and symptoms of potential                            delayed complications were discussed with the                            patient. Return to normal activities tomorrow.                            Written discharge instructions were provided to the                            patient.                           - Resume previous diet.                           - Continue present medications.                           - Await pathology results. Remo Lipps P. Juliett Eastburn, MD 11/20/2018 4:06:58 PM This report has been signed electronically.

## 2018-11-20 NOTE — Progress Notes (Signed)
Called to room to assist during endoscopic procedure.  Patient ID and intended procedure confirmed with present staff. Received instructions for my participation in the procedure from the performing physician.  

## 2018-11-20 NOTE — Patient Instructions (Signed)
Handouts provided:  Polyps  YOU HAD AN ENDOSCOPIC PROCEDURE TODAY AT [facility]:   Refer to the procedure report that was given to you for any specific questions about what was found during the examination.  If the procedure report does not answer your questions, please call your gastroenterologist to clarify.  If you requested that your care partner not be given the details of your procedure findings, then the procedure report has been included in a sealed envelope for you to review at your convenience later.  YOU SHOULD EXPECT: Some feelings of bloating in the abdomen. Passage of more gas than usual.  Walking can help get rid of the air that was put into your GI tract during the procedure and reduce the bloating. If you had a lower endoscopy (such as a colonoscopy or flexible sigmoidoscopy) you may notice spotting of blood in your stool or on the toilet paper. If you underwent a bowel prep for your procedure, you may not have a normal bowel movement for a few days.  Please Note:  You might notice some irritation and congestion in your nose or some drainage.  This is from the oxygen used during your procedure.  There is no need for concern and it should clear up in a day or so.  SYMPTOMS TO REPORT IMMEDIATELY:   Following lower endoscopy (colonoscopy or flexible sigmoidoscopy):  Excessive amounts of blood in the stool  Significant tenderness or worsening of abdominal pains  Swelling of the abdomen that is new, acute  Fever of 100F or higher   Following upper endoscopy (EGD)  Vomiting of blood or coffee ground material  New chest pain or pain under the shoulder blades  Painful or persistently difficult swallowing  New shortness of breath  Fever of 100F or higher  Black, tarry-looking stools  For urgent or emergent issues, a gastroenterologist can be reached at any hour by calling [phone].   DIET:  We do recommend a small meal at first, but then you may proceed  to your regular diet.  Drink plenty of fluids but you should avoid alcoholic beverages for 24 hours.  ACTIVITY:  You should plan to take it easy for the rest of today and you should NOT DRIVE or use heavy machinery until tomorrow (because of the sedation medicines used during the test).    FOLLOW UP: Our staff will call the number listed on your records the next business day following your procedure to check on you and address any questions or concerns that you may have regarding the information given to you following your procedure. If we do not reach you, we will leave a message.  However, if you are feeling well and you are not experiencing any problems, there is no need to return our call.  We will assume that you have returned to your regular daily activities without incident.  If any biopsies were taken you will be contacted by phone or by letter within the next 1-3 weeks.  Please call us at [phone] if you have not heard about the biopsies in 3 weeks.    SIGNATURES/CONFIDENTIALITY: You and/or your care partner have signed paperwork which will be entered into your electronic medical record.  These signatures attest to the fact that that the information above on your After Visit Summary has been reviewed and is understood.  Full responsibility of the confidentiality of this discharge information lies with you and/or your care-partner.

## 2018-11-20 NOTE — Telephone Encounter (Signed)
-----   Message from Yetta Flock, MD sent at 11/20/2018  4:25 PM EST ----- Regarding: Dr. Bryan Lemma consult Hi Lesly Rubenstein,  I would like this patient to be set up for a consult to see Dr. Bryan Lemma for a TIF evaluation. Can you help coordinate and give her a call sometime this week? Thanks much  Dr. Havery Moros

## 2018-11-20 NOTE — Progress Notes (Signed)
Report given to PACU, vss 

## 2018-11-20 NOTE — Telephone Encounter (Signed)
Please review patient's chart and Dr. Doyne Keel request for patinet to be consulted for TIF procedure;  Please advise on next step in plan of care;

## 2018-11-20 NOTE — Op Note (Signed)
[facility] Patient Name: [name] Procedure Date: 11/20/2018 3:20 PM MRN: [id number] Endoscopist: Remo Lipps P. Havery Moros , MD Age: 78 Referring MD:  Date of Birth: 06/04/1941 Gender: Female Account #: [id number] Procedure:                Upper GI endoscopy Indications:              Follow-up of gastro-esophageal reflux disease -                            Demeester score of 152 in the past, ongoing                            symptoms despite Dexilant. Declined Nissen after                            surgical consultation, but interested in evaluation                            for TIF Medicines:                Monitored Anesthesia Care Procedure:                Pre-Anesthesia Assessment:                           - Prior to the procedure, a History and Physical                            was performed, and patient medications and                            allergies were reviewed. The patient's tolerance of                            previous anesthesia was also reviewed. The risks                            and benefits of the procedure and the sedation                            options and risks were discussed with the patient.                            All questions were answered, and informed consent                            was obtained. Prior Anticoagulants: The patient has                            taken no previous anticoagulant or antiplatelet                            agents. ASA Grade Assessment: III - A patient with  severe systemic disease. After reviewing the risks                            and benefits, the patient was deemed in                            satisfactory condition to undergo the procedure.                           After obtaining informed consent, the endoscope was                            passed under direct vision. Throughout the                            procedure, the patient's blood pressure, pulse, and                             oxygen saturations were monitored continuously. The                            Endoscope was introduced through the mouth, and                            advanced to the second part of duodenum. The upper                            GI endoscopy was accomplished without difficulty.                            The patient tolerated the procedure well. Scope In: Scope Out: Findings:                 Esophagogastric landmarks were identified: the                            Z-line was found at 39 cm, the gastroesophageal                            junction was found at 39 cm and the upper extent of                            the gastric folds was found at 39 cm from the                            incisors.                           The z line was slightly irregular but did not meet                            biopsy criteria for BE (previously biopsied as well  as negative). The exam of the esophagus was                            otherwise normal.                           The entire examined stomach was normal. Hill grade                            II on retroflexed views (although challenging to                            get a good photo of this)                           A single 3 to 4 mm sessile polyp was found in the                            second portion of the duodenum. The polyp was                            removed with a cold snare. Resection and retrieval                            were complete. Relook at the site prior to the end                            of the case showed no bleeding.                           The exam of the duodenum was otherwise normal. Complications:            No immediate complications. Estimated blood loss:                            Minimal. Estimated Blood Loss:     Estimated blood loss was minimal. Impression:               - Esophagogastric landmarks identified.                           -  Normal esophagus                           - Normal stomach.                           - A single duodenal polyp. Resected and retrieved. Recommendation:           - Patient has a contact number available for                            emergencies. The signs and symptoms of potential  delayed complications were discussed with the                            patient. Return to normal activities tomorrow.                            Written discharge instructions were provided to the                            patient.                           - Resume previous diet.                           - Continue present medications.                           - Await pathology results.                           - Recommend further evaluation for possible TIF,                            consultation with Dr. Bryan Lemma for this if the                            patient is interested Remo Lipps P. Armbruster, MD 11/20/2018 4:13:17 PM This report has been signed electronically.

## 2018-11-21 ENCOUNTER — Telehealth: Payer: Self-pay

## 2018-11-21 NOTE — Telephone Encounter (Signed)
  Follow up Call-  Call back number 11/20/2018  Post procedure Call Back phone  # [phone]  Permission to leave phone message Yes  Some recent data might be hidden     Patient questions:  Do you have a fever, pain , or abdominal swelling? No. Pain Score  0 *  Have you tolerated food without any problems? Yes.    Have you been able to return to your normal activities? Yes.    Do you have any questions about your discharge instructions: Diet   No. Medications  No. Follow up visit  No.  Do you have questions or concerns about your Care? No.  Actions: * If pain score is 4 or above: No action needed, pain <4.

## 2018-11-21 NOTE — Telephone Encounter (Signed)
Called and spoke with patient-patient informed of MD recommendations and patient is agreeable with plan of care; patient was informed of appt date/time of Friday 11/24/2018 arrival at 10:45 am for appt at 11:00 am; Patient verbalized understanding of information/instructions; Patient was advised to call back if questions/concerns arise;

## 2018-11-21 NOTE — Telephone Encounter (Signed)
Chart and yesterday's EGD reviewed. Agreed, please set up for OV with me to discuss TIF. Thanks.

## 2018-11-24 ENCOUNTER — Ambulatory Visit (INDEPENDENT_AMBULATORY_CARE_PROVIDER_SITE_OTHER): Payer: Medicare HMO | Admitting: Gastroenterology

## 2018-11-24 ENCOUNTER — Encounter: Payer: Self-pay | Admitting: Gastroenterology

## 2018-11-24 VITALS — BP 130/80 | HR 72 | Ht 65.0 in | Wt 89.0 lb

## 2018-11-24 DIAGNOSIS — K219 Gastro-esophageal reflux disease without esophagitis: Secondary | ICD-10-CM

## 2018-11-24 DIAGNOSIS — G5601 Carpal tunnel syndrome, right upper limb: Secondary | ICD-10-CM | POA: Insufficient documentation

## 2018-11-24 NOTE — Patient Instructions (Signed)
If you are age 76 or older, your body mass index should be between 23-30. Your Body mass index is 14.81 kg/m. If this is out of the aforementioned range listed, please consider follow up with your Primary Care Provider.  If you are age 77 or younger, your body mass index should be between 19-25. Your Body mass index is 14.81 kg/m. If this is out of the aformentioned range listed, please consider follow up with your Primary Care Provider.   It was a pleasure to see you today!  Vito Cirigliano, D.O.

## 2018-11-24 NOTE — Progress Notes (Signed)
P  Chief Complaint:    GERD, preoperative evaluation  HPI:     Patient is a 78 y.o. female referred to me by Dr. Havery Moros for evaluation for possible TIF for chronic reflux.    She has a longstanding history of severe reflux with evaluation as below.  She is currently treated with Dexilant 60 mg daily with overall improvement, but continues to have daily breakthrough symptoms during the daytime and night.  Most recently added baclofen 5 mg nightly, which she actually splits into quarters.  Has not noticed clinical improvement with the baclofen but does notice grogginess and wants to stop taking.  Takes liquid Carafate prn breakthrough symptoms with additional relief.Evaluated by Dr. Hassell Done of General Surgery in 2019 to discuss possible Nissen fundoplication, but she declined owing to risks of surgery.  Most recently seen by Dr. Havery Moros earlier this month with repeat EGD completed last week as outlined below, and referred to discuss TIF.  Does endorse episodic dysphagia to solids but no stricture noted on EGD or esophagram previously.  EM  in 2016 showed what was thought to be Nutcracker Esophagus at the time, but normal motility on esophagram 2018. Trialed diltiazem without improvement.   Of note, she has a history of chronic bronchiectasis and recurrent pneumonia, thought secondary to ongoing severe reflux.  -EGD 08/2015 - 1cm HH, gastritis without ulceration, HP negative -pH study done in 2016 showingDemeester score of 152.7 but only symptom index of 23% for reflux. Most of reflux was weakly acidic. -Esophageal manometry showed what was thought to be nutcracker esophagusat the time; No swallows with DCI > 8000 - GES normal 2016 -Esophogram 10/10/2017 - small sliding type hiatal hernia, no distal stricture / mass, normal motility -Repeat manometry 03/10/2018 - hypercontractile esophagus, no other significant pathology; single swallow with DCI >8000 -EGD 11/20/2018- no hiatal hernia,  Hill grade 2 valve, slightly irregular Z line but no BP, 3 to 4 mm duodenal adenoma removed by cold snare with otherwise normal-appearing stomach and duodenum.  She is otherwise in her usual state of health and without any additional complaints today. She does endorse a long history of early satiety and regurgitation if she "overeats". Only consumes 1 meal/day (dinner). Part of this is out of fear of regurgitation when she eats too much. GES normal in 2016.   GERD HQL questionnaire: 39/50  Review of systems:     No chest pain, no SOB, no fevers, no urinary sx   Past Medical History:  Diagnosis Date  . Allergy   . Arthritis   . BOOP (bronchiolitis obliterans with organizing pneumonia) ([place])   . Bronchiectasis   . Cataract   . Esophageal dysmotility   . Gastroparesis   . GERD (gastroesophageal reflux disease)    severe  . Iron deficiency anemia   . Neuropathy   . Nutcracker esophagus   . Pneumonia   . Raynaud's disease   . Right lower lobe pneumonia December 2013    Patient's surgical history, family medical history, social history, medications and allergies were all reviewed in Epic    Current Outpatient Medications  Medication Sig Dispense Refill  . Acetaminophen (TYLENOL EXTRA STRENGTH PO) Take 350 mg by mouth as needed.     . Baclofen 5 MG TABS Take 5 mg by mouth at bedtime. (Patient taking differently: Take 1.25 mg by mouth at bedtime. Said she takes 1/4 of a pill at night) 30 tablet 3  . benzonatate (TESSALON) 200 MG capsule Take 1 capsule (  200 mg total) by mouth 3 (three) times daily as needed for cough. (Patient taking differently: Take 200 mg by mouth as needed for cough. ) 30 capsule 2  . DEXILANT 60 MG capsule TAKE 1 CAPSULE DAILY 90 capsule 3  . fexofenadine (ALLEGRA) 180 MG tablet Take 180 mg by mouth daily.    [name] PREMARIN 0.3 MG tablet Take 0.3 mg by mouth daily.     [name] Respiratory Therapy Supplies (FLUTTER) DEVI Use as directed. 1 each 0  . sucralfate (CARAFATE) 1  GM/10ML suspension Take 10 mLs (1 g total) by mouth every 6 (six) hours as needed. (Patient taking differently: Take 1 g by mouth every 6 (six) hours as needed. Said she just sticks her finger in it and lick it) 893 mL 1  . Thiamine Mononitrate (VITAMIN B1 PO) Take 1 tablet by mouth as needed.     . TURMERIC PO Take 1 tablet by mouth as needed.    . Gabapentin 300 MG/6ML SOLN Take 2 mLs by mouth at bedtime. (Patient not taking: Reported on 11/24/2018) 470 mL 11  . sodium chloride HYPERTONIC 3 % nebulizer solution Take by nebulization as needed for other. (Patient not taking: Reported on 11/24/2018) 750 mL 12   No current facility-administered medications for this visit.     Physical Exam:     BP 130/80   Pulse 72   Ht 5\' 5"  (1.651 m)   Wt 89 lb (40.4 kg)   BMI 14.81 kg/m   GENERAL:  Pleasant female in NAD PSYCH: : Cooperative, normal affect EENT:  conjunctiva pink, mucous membranes moist, neck supple without masses CARDIAC:  RRR, no murmur heard, no peripheral edema PULM: Normal respiratory effort, lungs CTA bilaterally, no wheezing ABDOMEN:  Nondistended, soft, nontender. No obvious masses, no hepatomegaly,  normal bowel sounds SKIN:  turgor, no lesions seen Musculoskeletal:  Normal muscle tone, normal strength NEURO: Alert and oriented x 3, no focal neurologic deficits   IMPRESSION and PLAN:    #1.  GERD: Longstanding history of reflux currently treated with Dexilant and Carafate which improved symptoms but still with breakthrough.  Had a long discussion regarding pathophysiology of GERD to include ongoing medical management and endoscopic antireflux procedures.  Previously evaluated by General Surgery and offered Nissen which she declined owing to fear of surgical risks.  Reviewed both prior EM studies.  While there is hypercontractility noted in 2016 with average DCI  >5000, there was no single swallow >8000. The more recent study in 2019 with avg DCI 4983 with a single swallow  >8000. Using current guidelines, does not meet [place] classification diagnostic criteria for Jackhammer Esophagus.  Additionally, per current literature (BMJ, 2019), no contraindication for antireflux procedures or elevated risk of postoperative dysphagia in patients with hypercontractile disorders.Limited data demonstrated trend towards clinical improvement with intervention.  - Discussed the risks, benefits, alternatives of TIF versus ongoing medical management at length.  Given her elevated DeMeester score, Hill grade 2 valve, extra esophageal manifestations, lack of advanced esophagitis and no other contraindications, she would be an appropriate candidate for TIF.  However, she is quite fearful of any surgical interventions and would like to hold off on this at this time in favor of continue medical management. -Resume Dexilant as prescribed - She would like to stop taking the baclofen due to no perceived benefit and + med ADRs.  This is reasonable. -Resume Carafate as needed - Can follow-up with Dr. Havery Moros for ongoing management.  I will remain available should  she change her mind or would like to discuss this again in the future.   I spent a total of 25 minutes of face-to-face time with the patient. Greater than 50% of the time was spent counseling and coordinating care.   Cc: Yetta Flock, MD      Lavena Bullion ,DO, [facility] 11/24/2018, 11:10 AM

## 2018-11-28 ENCOUNTER — Encounter: Payer: Self-pay | Admitting: Gastroenterology

## 2018-11-28 DIAGNOSIS — G5621 Lesion of ulnar nerve, right upper limb: Secondary | ICD-10-CM | POA: Diagnosis not present

## 2018-11-28 DIAGNOSIS — G5601 Carpal tunnel syndrome, right upper limb: Secondary | ICD-10-CM | POA: Diagnosis not present

## 2018-11-29 ENCOUNTER — Ambulatory Visit (INDEPENDENT_AMBULATORY_CARE_PROVIDER_SITE_OTHER): Payer: Medicare HMO | Admitting: Podiatry

## 2018-11-29 ENCOUNTER — Other Ambulatory Visit: Payer: Self-pay | Admitting: Podiatry

## 2018-11-29 ENCOUNTER — Ambulatory Visit (INDEPENDENT_AMBULATORY_CARE_PROVIDER_SITE_OTHER): Payer: Medicare HMO

## 2018-11-29 ENCOUNTER — Encounter: Payer: Self-pay | Admitting: Podiatry

## 2018-11-29 VITALS — BP 137/88 | HR 72

## 2018-11-29 DIAGNOSIS — G629 Polyneuropathy, unspecified: Secondary | ICD-10-CM | POA: Diagnosis not present

## 2018-11-29 DIAGNOSIS — M79671 Pain in right foot: Secondary | ICD-10-CM

## 2018-11-29 DIAGNOSIS — M79672 Pain in left foot: Principal | ICD-10-CM

## 2018-11-29 DIAGNOSIS — J471 Bronchiectasis with (acute) exacerbation: Secondary | ICD-10-CM | POA: Diagnosis not present

## 2018-11-30 ENCOUNTER — Telehealth: Payer: Self-pay | Admitting: Podiatry

## 2018-11-30 NOTE — Telephone Encounter (Signed)
Pt was seen in office yesterday and was given 3 shots but forgot to ask what the shots were. Pt would just like to know. Please give pt a call.

## 2018-12-03 NOTE — Progress Notes (Signed)
   Subjective: 78 year old female presenting today as a new patient with a chief complaint of venous insufficiency of the bilateral lower extremities that has been present for several years. She also reports neuropathy an pain in the bilateral heels and toes. She reports associated numbness and tingling of the feet. Wearing regular shoes increases her pain. She has been taking Gabapentin for the symptoms. Patient is here for further evaluation and treatment.   Past Medical History:  Diagnosis Date  . Allergy   . Arthritis   . BOOP (bronchiolitis obliterans with organizing pneumonia) ([place])   . Bronchiectasis   . Cataract   . Esophageal dysmotility   . Gastroparesis   . GERD (gastroesophageal reflux disease)    severe  . Iron deficiency anemia   . Neuropathy   . Nutcracker esophagus   . Pneumonia   . Raynaud's disease   . Right lower lobe pneumonia December 2013     Objective: Physical Exam General: The patient is alert and oriented x3 in no acute distress.  Dermatology: Skin is warm, dry and supple bilateral lower extremities. Negative for open lesions or macerations bilateral.   Vascular: Dorsalis Pedis and Posterior Tibial pulses palpable bilateral.  Capillary fill time is immediate to all digits.  Neurological: Epicritic and protective threshold diminished bilateral.   Musculoskeletal: Tenderness to palpation to the plantar aspect of the left heel along the plantar fascia as well as to the left third toe. Fluctuant, non-adhered mass noted to the dorsum of the right foot. All other joints range of motion within normal limits bilateral. Strength 5/5 in all groups bilateral.   Radiographic exam: Normal osseous mineralization. Joint spaces preserved. No fracture/dislocation/boney destruction. No other soft tissue abnormalities or radiopaque foreign bodies.   Assessment: 1. Plantar fasciitis left foot 2. BLE peripheral neuropathy right greater than left 3. Ganglion cyst right  dorsal foot 4. Capsulitis left third toe  Plan of Care:  1. Patient evaluated. Xrays reviewed.   2. Injection of 0.5cc Celestone soluspan injected into the left plantar fascia.  3. Injection of 0.5 mLs Celestone Soluspan injected into the ganglion cyst of the right foot.  4. Injection of 0.5 mLs Celestone Soluspan injected into the left third toe. 5. Recommended good shoe gear.  6. Instructed patient regarding therapies and modalities at home to alleviate symptoms.  7. Return to clinic in 4 weeks.     Edrick Kins, DPM [facility]  Dr. Edrick Kins, DPM    [address], [place] [address]                Office [phone]  Fax [phone]

## 2018-12-19 ENCOUNTER — Ambulatory Visit: Payer: Medicare HMO | Admitting: Neurology

## 2018-12-26 ENCOUNTER — Encounter: Payer: Self-pay | Admitting: Pulmonary Disease

## 2018-12-26 ENCOUNTER — Ambulatory Visit (INDEPENDENT_AMBULATORY_CARE_PROVIDER_SITE_OTHER): Payer: Medicare HMO | Admitting: Pulmonary Disease

## 2018-12-26 VITALS — BP 110/70 | HR 81 | Ht 64.0 in | Wt 87.0 lb

## 2018-12-26 DIAGNOSIS — J479 Bronchiectasis, uncomplicated: Secondary | ICD-10-CM

## 2018-12-26 DIAGNOSIS — J432 Centrilobular emphysema: Secondary | ICD-10-CM | POA: Diagnosis not present

## 2018-12-26 NOTE — Patient Instructions (Signed)
Follow up in 4 months 

## 2018-12-26 NOTE — Progress Notes (Signed)
[facility]  Chief Complaint  Patient presents with  . Follow-up    Pt has chest tightness, dry cough, and SOB with exertion. Pt has some dizziness.    Constitutional:  BP 110/70 (BP Location: Left Arm, Cuff Size: Normal)   Pulse 81   Ht 5\' 4"  (1.626 m)   Wt 87 lb (39.5 kg)   SpO2 97%   BMI 14.93 kg/m   Past Medical History:  OA, Esophageal dysmotility, Nutcracker esophagus, Gastroparesis, GERD, Iron deficiency anemia, Neuropathy, Raynaud's  Brief Summary:  [name] is a 78 y.o. female former smoker with bronchiectasis, recurrent aspiration pneumonia with history of reflux, emphysema and history of cryptogenic organizing pneumonia.  Respiratory symptoms at baseline.  Has cough with clear sputum.  Phlegm usually comes up easily.  Hasn't needed to use albuterol, nebulized saline, or mucinex recently.  Generally feels fatigued.  Not having fever, chest pain, or hemoptysis.  Still having trouble with reflux.  Struggles to keep her weight up.  She is very sensitive to medications.  She feels compelled to exercise on a regular basis and this seems to help.  Physical Exam:   Appearance - thin  ENMT - no sinus tenderness, no nasal discharge, no oral exudate  Neck - no masses, trachea midline, no thyromegaly, no elevation in JVP  Respiratory - normal appearance of chest wall, normal respiratory effort w/o accessory muscle use, no dullness on percussion, no wheezing or rales  CV - s1s2 regular rate and rhythm, no murmurs, no peripheral edema, radial pulses symmetric  GI - soft, non tender  Lymph - no adenopathy noted in neck and axillary areas  MSK - normal gait  Ext - no cyanosis, clubbing, or joint inflammation noted  Skin - no rashes, lesions, or ulcers  Neuro - normal strength, oriented x 3  Psych - normal mood and affect   Discussion:  She has bronchiectasis with recurrent pneumonias in setting of gastroesophageal reflux.   She has recurrent episodes of respiratory infection with increased sputum production.  This contributes to intermittent episodes of shortness of breath and fatigue.  Her CT chest findings represent varying degrees of infection and mucus plugging.  I am not concerned about malignancy at this point.  Assessment/Plan:   Bronchiectasis, centrilobular emphysema. - she would like to minimize interventions as able - she didn't tolerate maintenance inhaler therapy previously >> made her feel wired - prn nebulized hypertonic saline, mucinex, albuterol - antibiotics when her symptoms progress - f/u CT imaging only if her symptoms progress  GERD with dysphagia. - followed by Dr. Havery Moros with GI   Patient Instructions  Follow up in 4 months   Chesley Mires, MD [place] Pager: [phone] 12/26/2018, 11:17 AM  Flow Sheet     Pulmonary tests:  PFT 12/31/09 >> FEV1 1.84(84%), FEV1% 73, TLC 4.94(98%), DLCO 77%, +BD. PFT 06/21/16 >> FEV1 1.22 (55%), FEV1% 76, TLC 5.29 (104%), DLCO 43% PFT 12/21/16 >> FEV1 1.49 (69%), FEV1% 78, TLC 5.42 (107%), RV 158%, DLCO 51%  Chest imaging:  CT chest 08/17/10 >> mild biapical scarring, basilar peribronchovascular nodularity and mild bronchiectasis HRCT chest 11/28/17 >> mild centrilobular emphysema, peribronchovascular nodularity, mild BTX, LLL consolidation CT chest 03/02/18 >> mild emphysema, areas of BTX, several nodular areas up to 1.4 cm CT chest 08/25/18 >> centrilobular emphysema, BTX with wall thickening and tree in bud RML/lingula/bilateral lower lobes, unchanged opacity LLL, new patchy opacities LUL  (reviewed by me)  Cardiac tests:  Echo9/01/17 >> EF 60 to 65%, grade 2 DD, mild MR, small PFO, PAS 27 mmHg   Medications:   Allergies as of 12/26/2018      Reactions   Cortisone    unsure   Prednisone    Sulfasalazine Other (See Comments)   Unsure of reaction   Sulfonamide Derivatives    Unsure of reaction        Medication List       Accurate as of December 26, 2018 11:17 AM. Always use your most recent med list.        Baclofen 5 MG Tabs Take 5 mg by mouth at bedtime.   benzonatate 200 MG capsule Commonly known as:  TESSALON Take 1 capsule (200 mg total) by mouth 3 (three) times daily as needed for cough.   DEXILANT 60 MG capsule Generic drug:  dexlansoprazole TAKE 1 CAPSULE DAILY   fexofenadine 180 MG tablet Commonly known as:  ALLEGRA Take 180 mg by mouth daily.   FLUTTER Devi Use as directed.   Gabapentin 300 MG/6ML Soln Take 2 mLs by mouth at bedtime.   PREMARIN 0.3 MG tablet Generic drug:  estrogens (conjugated) Take 0.3 mg by mouth daily.   sodium chloride HYPERTONIC 3 % nebulizer solution Take by nebulization as needed for other.   sucralfate 1 GM/10ML suspension Commonly known as:  CARAFATE Take 10 mLs (1 g total) by mouth every 6 (six) hours as needed.   TURMERIC PO Take 1 tablet by mouth as needed.   TYLENOL EXTRA STRENGTH PO Take 350 mg by mouth as needed.   VITAMIN B1 PO Take 1 tablet by mouth as needed.       Past Surgical History:  She  has a past surgical history that includes Cholecystectomy (2005); Breast lumpectomy; Total abdominal hysterectomy; Lung biopsy; Cataract extraction; Colonoscopy; Upper gastrointestinal endoscopy; Esophageal manometry (N/A, 10/06/2015); 24 hour ph study (N/A, 10/13/2015); and Esophageal manometry (N/A, 03/10/2018).  Family History:  Her family history includes Cancer in her maternal grandmother; Heart disease in her father and paternal grandfather; Leukemia in her paternal grandmother; Lung cancer in her paternal uncle; Lymphoma in her mother.  Social History:  She  reports that she quit smoking about 50 years ago. Her smoking use included cigarettes. She has never used smokeless tobacco. She reports that she does not drink alcohol or use drugs.

## 2018-12-27 ENCOUNTER — Ambulatory Visit (INDEPENDENT_AMBULATORY_CARE_PROVIDER_SITE_OTHER): Payer: Medicare HMO | Admitting: Podiatry

## 2018-12-27 DIAGNOSIS — M722 Plantar fascial fibromatosis: Secondary | ICD-10-CM

## 2018-12-27 DIAGNOSIS — G629 Polyneuropathy, unspecified: Secondary | ICD-10-CM

## 2018-12-27 DIAGNOSIS — M79671 Pain in right foot: Secondary | ICD-10-CM

## 2018-12-27 DIAGNOSIS — M79672 Pain in left foot: Secondary | ICD-10-CM

## 2018-12-28 ENCOUNTER — Encounter: Payer: Self-pay | Admitting: *Deleted

## 2018-12-28 ENCOUNTER — Telehealth: Payer: Self-pay | Admitting: Pulmonary Disease

## 2018-12-28 MED ORDER — AMOXICILLIN-POT CLAVULANATE 875-125 MG PO TABS: 1.0000 | ORAL_TABLET | Freq: Two times a day (BID) | ORAL | 0 refills | Status: DC

## 2018-12-28 NOTE — Telephone Encounter (Signed)
LMTCB x1 for pt.  

## 2018-12-28 NOTE — Telephone Encounter (Signed)
Recently seen by Dr. Halford Chessman. Hx bronchiectasis. Ok to send in abx, will send in prescription for augmentin. Advise mucinex twice daily and flutter valve if she has it.

## 2018-12-28 NOTE — Telephone Encounter (Signed)
Spoke with pt. She is aware of Beth's response. Nothing further was needed. 

## 2018-12-28 NOTE — Telephone Encounter (Signed)
Spoke with pt. States that she is not feeling well. Reports increased coughing, shortness of breath and fatigue. Cough is producing gray mucus. Denies chest tightness, wheezing, fever/chills/sweats. Symptoms started 2-3 days ago. Pt has not tried any OTC medications. She is taking her medications are prescribed. Pt refused an appointment x2. Wants something sent in.  Beth - please advise. Thanks!

## 2018-12-30 DIAGNOSIS — J471 Bronchiectasis with (acute) exacerbation: Secondary | ICD-10-CM | POA: Diagnosis not present

## 2019-01-01 DIAGNOSIS — J0100 Acute maxillary sinusitis, unspecified: Secondary | ICD-10-CM | POA: Diagnosis not present

## 2019-01-01 DIAGNOSIS — J8410 Pulmonary fibrosis, unspecified: Secondary | ICD-10-CM | POA: Diagnosis not present

## 2019-01-01 NOTE — Progress Notes (Signed)
   Subjective: 78 year old female presenting today for follow up evaluation of left foot pain and bilateral neuropathy. She states her symptoms are the same and have not improved. She states the injections provided only temporary relief. There are no modifying factors noted. Patient is here for further evaluation and treatment.   Past Medical History:  Diagnosis Date  . Allergy   . Arthritis   . BOOP (bronchiolitis obliterans with organizing pneumonia) ([place])   . Bronchiectasis   . Cataract   . Esophageal dysmotility   . Gastroparesis   . GERD (gastroesophageal reflux disease)    severe  . Iron deficiency anemia   . Neuropathy   . Nutcracker esophagus   . Pneumonia   . Raynaud's disease   . Right lower lobe pneumonia December 2013     Objective: Physical Exam General: The patient is alert and oriented x3 in no acute distress.  Dermatology: Nails are tender, long, thickened and dystrophic with subungual debris, consistent with onychomycosis, 1-5 bilateral. No signs of infection noted. Skin is warm, dry and supple bilateral lower extremities. Negative for open lesions or macerations bilateral.   Vascular: Dorsalis Pedis and Posterior Tibial pulses palpable bilateral.  Capillary fill time is immediate to all digits.  Neurological: Epicritic and protective threshold diminished bilateral.   Musculoskeletal: Tenderness to palpation to the plantar aspect of the left heel along the plantar fascia as well as to the left third toe. All other joints range of motion within normal limits bilateral. Strength 5/5 in all groups bilateral.    Assessment: 1. Plantar fasciitis left foot - unchanged 2. BLE peripheral neuropathy right greater than left 3. Ganglion cyst right dorsal foot - resolved  4. Capsulitis left third toe 5. Onychodystrophic nails 1-5 bilateral with hyperkeratosis of nails.  6. Onychomycosis of nail due to dermatophyte bilateral  Plan of Care:  1. Patient evaluated.    2. Plantar fascial brace dispensed.  3. Mechanical debridement of nails 1-5 bilaterally performed using a nail nipper. Filed with dremel without incident.  4. Return to clinic in 3 months for routine care.   Edrick Kins, DPM [facility]  Dr. Edrick Kins, DPM    [address], [place] [address]                Office [phone]  Fax [phone]

## 2019-01-02 ENCOUNTER — Encounter: Payer: Self-pay | Admitting: Vascular Surgery

## 2019-01-02 ENCOUNTER — Ambulatory Visit (INDEPENDENT_AMBULATORY_CARE_PROVIDER_SITE_OTHER): Payer: Medicare HMO | Admitting: Vascular Surgery

## 2019-01-02 ENCOUNTER — Ambulatory Visit (HOSPITAL_COMMUNITY)
Admission: RE | Admit: 2019-01-02 | Discharge: 2019-01-02 | Disposition: A | Discharge status: Home / Self Care | Payer: Medicare HMO | Source: Ambulatory Visit | Attending: Family | Admitting: Family

## 2019-01-02 DIAGNOSIS — I872 Venous insufficiency (chronic) (peripheral): Secondary | ICD-10-CM

## 2019-01-02 NOTE — Progress Notes (Signed)
Patient name: [name] MRN: [id number] DOB: [birth year] Sex: female  REASON FOR VISIT: Bilateral venous insuffiency  HPI: [name] is a 78 y.o. female with history of neuropathy as well as bilateral lower extremity venous insufficiency that presents for evaluation of her bilateral venous insufficiency.  Patient was previously seen by Dr. Bridgett Larsson on 07/06/2017 where she had bilateral common femoral vein reflux in the deep system and recommended bilateral lower extremity compression.  She presents for follow-up today for new evaluation.  She actually thought this was an appointment to get her neuropathy evaluated.  She reports that she continues to have bilateral lower extremity leg heaviness with some discomfort in the shins.  She feels both her legs are equally affected.  She is wearing knee-high compression without any issues.  No ulcer loss no DVT no trauma.  Past Medical History:  Diagnosis Date  . Allergy   . Arthritis   . BOOP (bronchiolitis obliterans with organizing pneumonia) ([place])   . Bronchiectasis   . Cataract   . Esophageal dysmotility   . Gastroparesis   . GERD (gastroesophageal reflux disease)    severe  . Iron deficiency anemia   . Neuropathy   . Nutcracker esophagus   . Pneumonia   . Raynaud's disease   . Right lower lobe pneumonia December 2013    Past Surgical History:  Procedure Laterality Date  . [place] STUDY N/A 10/13/2015   Procedure: [place] STUDY;  Surgeon: Gatha Mayer, MD;  Location: [facility];  Service: Endoscopy;  Laterality: N/A;  . BREAST LUMPECTOMY     left, x 2  . CATARACT EXTRACTION     both eyes  . CHOLECYSTECTOMY  2005  . COLONOSCOPY    . ESOPHAGEAL MANOMETRY N/A 10/06/2015   Procedure: ESOPHAGEAL MANOMETRY (EM);  Surgeon: Manus Gunning, MD;  Location: [facility];  Service: Gastroenterology;  Laterality: N/A;  . ESOPHAGEAL MANOMETRY N/A 03/10/2018   Procedure: ESOPHAGEAL MANOMETRY (EM);  Surgeon: Mauri Pole, MD;  Location: [facility];  Service: Endoscopy;  Laterality: N/A;  . LUNG BIOPSY     vats right 2003  . TOTAL ABDOMINAL HYSTERECTOMY    . UPPER GASTROINTESTINAL ENDOSCOPY      Family History  Problem Relation Age of Onset  . Lymphoma Mother   . Heart disease Father   . Lung cancer Paternal Uncle   . Heart disease Paternal Grandfather   . Leukemia Paternal Grandmother   . Cancer Maternal Grandmother        unknown type  . Colon cancer Neg Hx   . Esophageal cancer Neg Hx   . Rectal cancer Neg Hx   . Stomach cancer Neg Hx     SOCIAL HISTORY: Social History   Tobacco Use  . Smoking status: Former Smoker    Types: Cigarettes    Last attempt to quit: 11/15/1968    Years since quitting: 50.1  . Smokeless tobacco: Never Used  . Tobacco comment: 3 cigs a day  Substance Use Topics  . Alcohol use: No    Allergies  Allergen Reactions  . Cortisone     unsure  . Prednisone   . Sulfasalazine Other (See Comments)    Unsure of reaction  . Sulfonamide Derivatives     Unsure of reaction    Current Outpatient Medications  Medication Sig Dispense Refill  . benzonatate (TESSALON) 200 MG capsule Take 1 capsule (200 mg total) by mouth 3 (three) times daily as  needed for cough. (Patient taking differently: Take 200 mg by mouth as needed for cough. ) 30 capsule 2  . DEXILANT 60 MG capsule TAKE 1 CAPSULE DAILY 90 capsule 3  . doxycycline (VIBRA-TABS) 100 MG tablet daily.    . fexofenadine (ALLEGRA) 180 MG tablet Take 180 mg by mouth daily.    . TURMERIC PO Take 1 tablet by mouth as needed.    . Acetaminophen (TYLENOL EXTRA STRENGTH PO) Take 350 mg by mouth as needed.     . Gabapentin 300 MG/6ML SOLN Take 2 mLs by mouth at bedtime. (Patient not taking: Reported on 01/02/2019) 470 mL 11  . Respiratory Therapy Supplies (FLUTTER) DEVI Use as directed. (Patient not taking: Reported on 01/02/2019) 1 each 0  . sodium chloride HYPERTONIC 3 % nebulizer solution Take by nebulization as needed  for other. (Patient not taking: Reported on 01/02/2019) 750 mL 12  . sucralfate (CARAFATE) 1 GM/10ML suspension Take 10 mLs (1 g total) by mouth every 6 (six) hours as needed. (Patient not taking: Reported on 01/02/2019) 420 mL 1  . Thiamine Mononitrate (VITAMIN B1 PO) Take 1 tablet by mouth as needed.      No current facility-administered medications for this visit.     REVIEW OF SYSTEMS:  [X]  denotes positive finding, [ ]  denotes negative finding Cardiac  Comments:  Chest pain or chest pressure:    Shortness of breath upon exertion:    Short of breath when lying flat:    Irregular heart rhythm:        Vascular    Pain in calf, thigh, or hip brought on by ambulation:    Pain in feet at night that wakes you up from your sleep:     Blood clot in your veins:    Leg swelling:  x       Pulmonary    Oxygen at home:    Productive cough:     Wheezing:         Neurologic    Sudden weakness in arms or legs:     Sudden numbness in arms or legs:     Sudden onset of difficulty speaking or slurred speech:    Temporary loss of vision in one eye:     Problems with dizziness:         Gastrointestinal    Blood in stool:     Vomited blood:         Genitourinary    Burning when urinating:     Blood in urine:        Psychiatric    Major depression:         Hematologic    Bleeding problems:    Problems with blood clotting too easily:        Skin    Rashes or ulcers:        Constitutional    Fever or chills:      PHYSICAL EXAM: Vitals:   01/02/19 1340  BP: 114/77  Pulse: 72  Resp: 16  SpO2: 100%  Weight: 84 lb 3.5 oz (38.2 kg)  Height: 5\' 4"  (1.626 m)    GENERAL: The patient is a well-nourished female, in no acute distress. The vital signs are documented above. CARDIAC: There is a regular rate and rhythm.  VASCULAR:  2+ palpable radial pulse bilateral upper extremity 2+ femoral pulse palpable bilateral groins 1+DP/PT palpable bilateral lower extremity Bilateral lower  extremity varicosities CEAP class C2 PULMONARY: There is good air exchange bilaterally  without wheezing or rales. ABDOMEN: Soft and non-tender with normal pitched bowel sounds.  MUSCULOSKELETAL: There are no major deformities or cyanosis. NEUROLOGIC: No focal weakness or paresthesias are detected. SKIN: There are no ulcers or rashes noted. PSYCHIATRIC: The patient has a normal affect.  DATA:   I independently reviewed her lower extremity reflux study and this shows elevated reflux times in the bilateral common femoral veins  Assessment/Plan:  78 year old female who presents with bilateral lower extremity venous insufficiency in the deep system given abnormal reflux times in the common femoral veins.  Discussed that we do not typically offer surgical intervention for deep venous reflux and this is managed with conservative measures.  I agree that she should remain in bilateral lower extremity compression with leg elevation.  Her real goal is to get her neuropathy evaluated in the future and she is hoping to arrange an appoint with her neurologist.  Offered her as needed follow-up.   Marty Heck, MD [facility] Office: [phone] Pager: [phone]

## 2019-01-03 ENCOUNTER — Telehealth: Payer: Self-pay | Admitting: Neurology

## 2019-01-03 DIAGNOSIS — H52209 Unspecified astigmatism, unspecified eye: Secondary | ICD-10-CM | POA: Diagnosis not present

## 2019-01-03 DIAGNOSIS — H524 Presbyopia: Secondary | ICD-10-CM | POA: Diagnosis not present

## 2019-01-03 DIAGNOSIS — H5203 Hypermetropia, bilateral: Secondary | ICD-10-CM | POA: Diagnosis not present

## 2019-01-03 NOTE — Telephone Encounter (Signed)
Per assessment/plan from visit with Dr. Carlis Abbott:  Discussed that we do not typically offer surgical intervention for deep venous reflux and this is managed with conservative measures.  I agree that she should remain in bilateral lower extremity compression with leg elevation.  Her real goal is to get her neuropathy evaluated in the future and she is hoping to arrange an appoint with her neurologist  Pt believed that her appointment yesterday with Dr. Carlis Abbott was for neuropathy eval.  pls advise.

## 2019-01-03 NOTE — Telephone Encounter (Signed)
Patient had a vein procedure done yesterday and the dr told her she need a nerve DR and wants to talk with Delice Lesch about it

## 2019-01-05 NOTE — Telephone Encounter (Signed)
Spoke with pt advising her that she see's Dr. Delice Lesch for her neuropathy.  Pt states that what Dr. Carlis Abbott did "did nothing to help".  I advised that it shouldn't have, since she saw him for diagnostic purposes and not treatment.  Pt states that she believes she should have another EMG preformed. States that she would also like to resume PT.  I let her know that I would send message to Dr. Delice Lesch and return call with her response.

## 2019-01-05 NOTE — Telephone Encounter (Signed)
Pls clarify with her that I have been seeing her for the neuropathy, we just wanted her to see vascular to make sure there is nothing else contributing to her leg problems. If Dr. Carlis Abbott thinks this is all neuropathy, then it is really medication and physical therapy for the weakness, there is not much more we can do. Thanks

## 2019-01-08 NOTE — Telephone Encounter (Signed)
Her EMG already confirmed the presence of neuropathy, repeating the test will not add much and would just be an added expense. Ok for PT for balance and strengthening, thanks

## 2019-01-09 DIAGNOSIS — G5621 Lesion of ulnar nerve, right upper limb: Secondary | ICD-10-CM | POA: Diagnosis not present

## 2019-01-09 DIAGNOSIS — G5601 Carpal tunnel syndrome, right upper limb: Secondary | ICD-10-CM | POA: Diagnosis not present

## 2019-01-24 ENCOUNTER — Ambulatory Visit: Payer: Medicare HMO | Admitting: Podiatry

## 2019-01-28 DIAGNOSIS — J471 Bronchiectasis with (acute) exacerbation: Secondary | ICD-10-CM | POA: Diagnosis not present

## 2019-02-28 DIAGNOSIS — J471 Bronchiectasis with (acute) exacerbation: Secondary | ICD-10-CM | POA: Diagnosis not present

## 2019-03-07 ENCOUNTER — Other Ambulatory Visit: Payer: Self-pay

## 2019-03-07 ENCOUNTER — Ambulatory Visit (INDEPENDENT_AMBULATORY_CARE_PROVIDER_SITE_OTHER): Payer: Medicare HMO

## 2019-03-07 ENCOUNTER — Ambulatory Visit (INDEPENDENT_AMBULATORY_CARE_PROVIDER_SITE_OTHER): Payer: Medicare HMO | Admitting: Podiatry

## 2019-03-07 ENCOUNTER — Encounter: Payer: Self-pay | Admitting: Podiatry

## 2019-03-07 VITALS — Temp 97.3°F

## 2019-03-07 DIAGNOSIS — M79604 Pain in right leg: Secondary | ICD-10-CM | POA: Diagnosis not present

## 2019-03-07 DIAGNOSIS — M7661 Achilles tendinitis, right leg: Secondary | ICD-10-CM

## 2019-03-07 DIAGNOSIS — B351 Tinea unguium: Secondary | ICD-10-CM | POA: Diagnosis not present

## 2019-03-07 DIAGNOSIS — M79675 Pain in left toe(s): Secondary | ICD-10-CM | POA: Diagnosis not present

## 2019-03-07 DIAGNOSIS — L84 Corns and callosities: Secondary | ICD-10-CM

## 2019-03-07 DIAGNOSIS — M79674 Pain in right toe(s): Secondary | ICD-10-CM

## 2019-03-07 DIAGNOSIS — M79605 Pain in left leg: Secondary | ICD-10-CM

## 2019-03-07 NOTE — Patient Instructions (Signed)

## 2019-03-14 ENCOUNTER — Encounter: Payer: Self-pay | Admitting: Podiatry

## 2019-03-14 DIAGNOSIS — R269 Unspecified abnormalities of gait and mobility: Secondary | ICD-10-CM | POA: Diagnosis not present

## 2019-03-14 DIAGNOSIS — M25671 Stiffness of right ankle, not elsewhere classified: Secondary | ICD-10-CM | POA: Diagnosis not present

## 2019-03-14 DIAGNOSIS — M25571 Pain in right ankle and joints of right foot: Secondary | ICD-10-CM | POA: Diagnosis not present

## 2019-03-14 DIAGNOSIS — M810 Age-related osteoporosis without current pathological fracture: Secondary | ICD-10-CM | POA: Diagnosis not present

## 2019-03-14 DIAGNOSIS — M62561 Muscle wasting and atrophy, not elsewhere classified, right lower leg: Secondary | ICD-10-CM | POA: Diagnosis not present

## 2019-03-14 DIAGNOSIS — M25471 Effusion, right ankle: Secondary | ICD-10-CM | POA: Diagnosis not present

## 2019-03-14 DIAGNOSIS — M62571 Muscle wasting and atrophy, not elsewhere classified, right ankle and foot: Secondary | ICD-10-CM | POA: Diagnosis not present

## 2019-03-14 DIAGNOSIS — M7661 Achilles tendinitis, right leg: Secondary | ICD-10-CM | POA: Diagnosis not present

## 2019-03-14 NOTE — Progress Notes (Signed)
Subjective: [name] presents today with painful, thick toenails 1-5 b/l that she cannot cut and which interfere with daily activities.  Pain is aggravated when wearing enclosed shoe gear and relieved with periodic professional debridement.  She also relates painful corn distal tip left third digit.  She has no concern of painful heel right lower extremity.  She states pain is around the posterior aspect of her ankle.  She states she has had this pain for years.  Aggravating factors are present when pressure is applied.  She is tried detox patches for this.  She is not sure if this is working for her.  [name], MD is a PCP.   Current Outpatient Medications:  .  Acetaminophen (TYLENOL EXTRA STRENGTH PO), Take 350 mg by mouth as needed. , Disp: , Rfl:  .  benzonatate (TESSALON) 200 MG capsule, Take 1 capsule (200 mg total) by mouth 3 (three) times daily as needed for cough. (Patient taking differently: Take 200 mg by mouth as needed for cough. ), Disp: 30 capsule, Rfl: 2 .  DEXILANT 60 MG capsule, TAKE 1 CAPSULE DAILY, Disp: 90 capsule, Rfl: 3 .  doxycycline (VIBRA-TABS) 100 MG tablet, daily., Disp: , Rfl:  .  fexofenadine (ALLEGRA) 180 MG tablet, Take 180 mg by mouth daily., Disp: , Rfl:  .  Gabapentin 300 MG/6ML SOLN, Take 2 mLs by mouth at bedtime., Disp: 470 mL, Rfl: 11 .  Respiratory Therapy Supplies (FLUTTER) DEVI, Use as directed., Disp: 1 each, Rfl: 0 .  sodium chloride HYPERTONIC 3 % nebulizer solution, Take by nebulization as needed for other., Disp: 750 mL, Rfl: 12 .  sucralfate (CARAFATE) 1 GM/10ML suspension, Take 10 mLs (1 g total) by mouth every 6 (six) hours as needed., Disp: 420 mL, Rfl: 1 .  Thiamine Mononitrate (VITAMIN B1 PO), Take 1 tablet by mouth as needed. , Disp: , Rfl:  .  TURMERIC PO, Take 1 tablet by mouth as needed., Disp: , Rfl:   Allergies  Allergen Reactions  . Cortisone     unsure  . Prednisone   . Sulfasalazine Other (See Comments)    Unsure of  reaction  . Sulfonamide Derivatives     Unsure of reaction    Objective:  Vascular Examination: Capillary refill time immediate x 10 digits.  Dorsalis pedis and Posterior tibial pulses palpable b/l.  Digital hair sparse x 10 digits.  Skin temperature gradient WNL b/l.  Dermatological Examination: Skin with is mildly thin and atrophied b/l.  Toenails 1-5 b/l discolored, thick, dystrophic with subungual debris and pain with palpation to nailbeds due to thickness of nails.  Hyperkeratotic lesion distal tip left third digit.  There is tenderness to palpation.  No erythema, no edema, no drainage, no flocculence noted.   Musculoskeletal: Muscle strength 5/5 to all LE muscle groups  Hammertoe deformity left third digit.  There is pain noted on the posterior aspect of the right heel at the insertion of the Achilles tendon. There is no edema, no warmth, no erythema.  There is no abnormal bony growth in this area.  Neurological: Sensation intact with 10 gram monofilament.  Vibratory sensation intact.  X-ray findings of the right ankle reveal no abnormal bony growth in area of the calcaneus.  She does have evidence of washed out trabecular area of the fibula malleolus consistent with osteoporosis.  Assessment: Painful onychomycosis toenails 1-5 b/l  Corn distal tip left third digit Achilles tendinitis right lower extremity  Plan: 1. Toenails 1-5 b/l were  debrided in length and girth without iatrogenic bleeding. Corn(s) pared distal tip left third digit utilizing sterile scalpel blade without incident.  Dispensed digital toe caps for daily protection. Discussed treatment options for Achilles tendinitis of the right lower extremity.  Dispensed bilateral compression anklets.  She will be referred to physical therapy for evaluation and treatment regarding Achilles tendinitis.  Referral is for 3 times per week for 1 month. Patient to continue soft, supportive shoe gear daily. Patient  to report any pedal injuries to medical professional immediately. Follow up 9 weeks for routine foot care. Patient/POA to call should there be a concern in the interim.

## 2019-03-16 DIAGNOSIS — M62561 Muscle wasting and atrophy, not elsewhere classified, right lower leg: Secondary | ICD-10-CM | POA: Diagnosis not present

## 2019-03-16 DIAGNOSIS — M25571 Pain in right ankle and joints of right foot: Secondary | ICD-10-CM | POA: Diagnosis not present

## 2019-03-16 DIAGNOSIS — M25671 Stiffness of right ankle, not elsewhere classified: Secondary | ICD-10-CM | POA: Diagnosis not present

## 2019-03-16 DIAGNOSIS — R269 Unspecified abnormalities of gait and mobility: Secondary | ICD-10-CM | POA: Diagnosis not present

## 2019-03-16 DIAGNOSIS — M62571 Muscle wasting and atrophy, not elsewhere classified, right ankle and foot: Secondary | ICD-10-CM | POA: Diagnosis not present

## 2019-03-16 DIAGNOSIS — M810 Age-related osteoporosis without current pathological fracture: Secondary | ICD-10-CM | POA: Diagnosis not present

## 2019-03-16 DIAGNOSIS — M7661 Achilles tendinitis, right leg: Secondary | ICD-10-CM | POA: Diagnosis not present

## 2019-03-16 DIAGNOSIS — M25471 Effusion, right ankle: Secondary | ICD-10-CM | POA: Diagnosis not present

## 2019-03-19 DIAGNOSIS — M25571 Pain in right ankle and joints of right foot: Secondary | ICD-10-CM | POA: Diagnosis not present

## 2019-03-19 DIAGNOSIS — M62571 Muscle wasting and atrophy, not elsewhere classified, right ankle and foot: Secondary | ICD-10-CM | POA: Diagnosis not present

## 2019-03-19 DIAGNOSIS — R269 Unspecified abnormalities of gait and mobility: Secondary | ICD-10-CM | POA: Diagnosis not present

## 2019-03-19 DIAGNOSIS — M25471 Effusion, right ankle: Secondary | ICD-10-CM | POA: Diagnosis not present

## 2019-03-19 DIAGNOSIS — M810 Age-related osteoporosis without current pathological fracture: Secondary | ICD-10-CM | POA: Diagnosis not present

## 2019-03-19 DIAGNOSIS — M62561 Muscle wasting and atrophy, not elsewhere classified, right lower leg: Secondary | ICD-10-CM | POA: Diagnosis not present

## 2019-03-19 DIAGNOSIS — M7661 Achilles tendinitis, right leg: Secondary | ICD-10-CM | POA: Diagnosis not present

## 2019-03-19 DIAGNOSIS — M25671 Stiffness of right ankle, not elsewhere classified: Secondary | ICD-10-CM | POA: Diagnosis not present

## 2019-03-20 DIAGNOSIS — H35372 Puckering of macula, left eye: Secondary | ICD-10-CM | POA: Diagnosis not present

## 2019-03-20 DIAGNOSIS — D3132 Benign neoplasm of left choroid: Secondary | ICD-10-CM | POA: Diagnosis not present

## 2019-03-20 DIAGNOSIS — H52203 Unspecified astigmatism, bilateral: Secondary | ICD-10-CM | POA: Diagnosis not present

## 2019-03-21 DIAGNOSIS — M25671 Stiffness of right ankle, not elsewhere classified: Secondary | ICD-10-CM | POA: Diagnosis not present

## 2019-03-21 DIAGNOSIS — M62571 Muscle wasting and atrophy, not elsewhere classified, right ankle and foot: Secondary | ICD-10-CM | POA: Diagnosis not present

## 2019-03-21 DIAGNOSIS — R269 Unspecified abnormalities of gait and mobility: Secondary | ICD-10-CM | POA: Diagnosis not present

## 2019-03-21 DIAGNOSIS — M25471 Effusion, right ankle: Secondary | ICD-10-CM | POA: Diagnosis not present

## 2019-03-21 DIAGNOSIS — M62561 Muscle wasting and atrophy, not elsewhere classified, right lower leg: Secondary | ICD-10-CM | POA: Diagnosis not present

## 2019-03-21 DIAGNOSIS — M810 Age-related osteoporosis without current pathological fracture: Secondary | ICD-10-CM | POA: Diagnosis not present

## 2019-03-21 DIAGNOSIS — M7661 Achilles tendinitis, right leg: Secondary | ICD-10-CM | POA: Diagnosis not present

## 2019-03-21 DIAGNOSIS — M25571 Pain in right ankle and joints of right foot: Secondary | ICD-10-CM | POA: Diagnosis not present

## 2019-03-23 DIAGNOSIS — M25571 Pain in right ankle and joints of right foot: Secondary | ICD-10-CM | POA: Diagnosis not present

## 2019-03-23 DIAGNOSIS — R269 Unspecified abnormalities of gait and mobility: Secondary | ICD-10-CM | POA: Diagnosis not present

## 2019-03-23 DIAGNOSIS — M7661 Achilles tendinitis, right leg: Secondary | ICD-10-CM | POA: Diagnosis not present

## 2019-03-23 DIAGNOSIS — M810 Age-related osteoporosis without current pathological fracture: Secondary | ICD-10-CM | POA: Diagnosis not present

## 2019-03-23 DIAGNOSIS — M25471 Effusion, right ankle: Secondary | ICD-10-CM | POA: Diagnosis not present

## 2019-03-23 DIAGNOSIS — M62571 Muscle wasting and atrophy, not elsewhere classified, right ankle and foot: Secondary | ICD-10-CM | POA: Diagnosis not present

## 2019-03-23 DIAGNOSIS — M62561 Muscle wasting and atrophy, not elsewhere classified, right lower leg: Secondary | ICD-10-CM | POA: Diagnosis not present

## 2019-03-23 DIAGNOSIS — M25671 Stiffness of right ankle, not elsewhere classified: Secondary | ICD-10-CM | POA: Diagnosis not present

## 2019-03-26 DIAGNOSIS — M810 Age-related osteoporosis without current pathological fracture: Secondary | ICD-10-CM | POA: Diagnosis not present

## 2019-03-26 DIAGNOSIS — M25671 Stiffness of right ankle, not elsewhere classified: Secondary | ICD-10-CM | POA: Diagnosis not present

## 2019-03-26 DIAGNOSIS — R269 Unspecified abnormalities of gait and mobility: Secondary | ICD-10-CM | POA: Diagnosis not present

## 2019-03-26 DIAGNOSIS — M25471 Effusion, right ankle: Secondary | ICD-10-CM | POA: Diagnosis not present

## 2019-03-26 DIAGNOSIS — M62571 Muscle wasting and atrophy, not elsewhere classified, right ankle and foot: Secondary | ICD-10-CM | POA: Diagnosis not present

## 2019-03-26 DIAGNOSIS — M25571 Pain in right ankle and joints of right foot: Secondary | ICD-10-CM | POA: Diagnosis not present

## 2019-03-26 DIAGNOSIS — M62561 Muscle wasting and atrophy, not elsewhere classified, right lower leg: Secondary | ICD-10-CM | POA: Diagnosis not present

## 2019-03-26 DIAGNOSIS — M7661 Achilles tendinitis, right leg: Secondary | ICD-10-CM | POA: Diagnosis not present

## 2019-03-28 DIAGNOSIS — M810 Age-related osteoporosis without current pathological fracture: Secondary | ICD-10-CM | POA: Diagnosis not present

## 2019-03-28 DIAGNOSIS — M7661 Achilles tendinitis, right leg: Secondary | ICD-10-CM | POA: Diagnosis not present

## 2019-03-28 DIAGNOSIS — M62561 Muscle wasting and atrophy, not elsewhere classified, right lower leg: Secondary | ICD-10-CM | POA: Diagnosis not present

## 2019-03-28 DIAGNOSIS — R269 Unspecified abnormalities of gait and mobility: Secondary | ICD-10-CM | POA: Diagnosis not present

## 2019-03-28 DIAGNOSIS — M25471 Effusion, right ankle: Secondary | ICD-10-CM | POA: Diagnosis not present

## 2019-03-28 DIAGNOSIS — M62571 Muscle wasting and atrophy, not elsewhere classified, right ankle and foot: Secondary | ICD-10-CM | POA: Diagnosis not present

## 2019-03-28 DIAGNOSIS — M25571 Pain in right ankle and joints of right foot: Secondary | ICD-10-CM | POA: Diagnosis not present

## 2019-03-28 DIAGNOSIS — M25671 Stiffness of right ankle, not elsewhere classified: Secondary | ICD-10-CM | POA: Diagnosis not present

## 2019-03-29 DIAGNOSIS — H35372 Puckering of macula, left eye: Secondary | ICD-10-CM | POA: Diagnosis not present

## 2019-03-29 DIAGNOSIS — Z961 Presence of intraocular lens: Secondary | ICD-10-CM | POA: Diagnosis not present

## 2019-03-30 DIAGNOSIS — M25471 Effusion, right ankle: Secondary | ICD-10-CM | POA: Diagnosis not present

## 2019-03-30 DIAGNOSIS — M810 Age-related osteoporosis without current pathological fracture: Secondary | ICD-10-CM | POA: Diagnosis not present

## 2019-03-30 DIAGNOSIS — M62561 Muscle wasting and atrophy, not elsewhere classified, right lower leg: Secondary | ICD-10-CM | POA: Diagnosis not present

## 2019-03-30 DIAGNOSIS — M25671 Stiffness of right ankle, not elsewhere classified: Secondary | ICD-10-CM | POA: Diagnosis not present

## 2019-03-30 DIAGNOSIS — M25571 Pain in right ankle and joints of right foot: Secondary | ICD-10-CM | POA: Diagnosis not present

## 2019-03-30 DIAGNOSIS — R269 Unspecified abnormalities of gait and mobility: Secondary | ICD-10-CM | POA: Diagnosis not present

## 2019-03-30 DIAGNOSIS — J471 Bronchiectasis with (acute) exacerbation: Secondary | ICD-10-CM | POA: Diagnosis not present

## 2019-03-30 DIAGNOSIS — M7661 Achilles tendinitis, right leg: Secondary | ICD-10-CM | POA: Diagnosis not present

## 2019-03-30 DIAGNOSIS — M62571 Muscle wasting and atrophy, not elsewhere classified, right ankle and foot: Secondary | ICD-10-CM | POA: Diagnosis not present

## 2019-04-02 ENCOUNTER — Telehealth: Payer: Self-pay | Admitting: Pulmonary Disease

## 2019-04-02 NOTE — Telephone Encounter (Signed)
Returned call to patient.  Patient states her PCP and her eye dr recently recommended she take fish oil but she remembered Dr. Melvyn Novas telling her once not to take it.  Since two physicians recommended it she wanted to know if there was a pulmonary reason for her not to take it.  She has upcoming eye surgery and eye MD had told her it would help her prep for her surgery and improve her vision.  She was just curious.  Advised I have no personal knowledge to share regarding why Dr. Melvyn Novas advised her not to take fish oil but I would gladly route this to him or another provider for review.  Patient declined since she has two physicians recommending it.  Nothing further needed. Patient states she has started taking fish oil and plans to continue.

## 2019-04-03 DIAGNOSIS — M7661 Achilles tendinitis, right leg: Secondary | ICD-10-CM | POA: Diagnosis not present

## 2019-04-03 DIAGNOSIS — M25471 Effusion, right ankle: Secondary | ICD-10-CM | POA: Diagnosis not present

## 2019-04-03 DIAGNOSIS — M810 Age-related osteoporosis without current pathological fracture: Secondary | ICD-10-CM | POA: Diagnosis not present

## 2019-04-03 DIAGNOSIS — M62571 Muscle wasting and atrophy, not elsewhere classified, right ankle and foot: Secondary | ICD-10-CM | POA: Diagnosis not present

## 2019-04-03 DIAGNOSIS — M62561 Muscle wasting and atrophy, not elsewhere classified, right lower leg: Secondary | ICD-10-CM | POA: Diagnosis not present

## 2019-04-03 DIAGNOSIS — R269 Unspecified abnormalities of gait and mobility: Secondary | ICD-10-CM | POA: Diagnosis not present

## 2019-04-03 DIAGNOSIS — M25671 Stiffness of right ankle, not elsewhere classified: Secondary | ICD-10-CM | POA: Diagnosis not present

## 2019-04-03 DIAGNOSIS — M25571 Pain in right ankle and joints of right foot: Secondary | ICD-10-CM | POA: Diagnosis not present

## 2019-04-06 DIAGNOSIS — M25571 Pain in right ankle and joints of right foot: Secondary | ICD-10-CM | POA: Diagnosis not present

## 2019-04-06 DIAGNOSIS — M62571 Muscle wasting and atrophy, not elsewhere classified, right ankle and foot: Secondary | ICD-10-CM | POA: Diagnosis not present

## 2019-04-06 DIAGNOSIS — M25671 Stiffness of right ankle, not elsewhere classified: Secondary | ICD-10-CM | POA: Diagnosis not present

## 2019-04-06 DIAGNOSIS — R269 Unspecified abnormalities of gait and mobility: Secondary | ICD-10-CM | POA: Diagnosis not present

## 2019-04-06 DIAGNOSIS — M25471 Effusion, right ankle: Secondary | ICD-10-CM | POA: Diagnosis not present

## 2019-04-06 DIAGNOSIS — M7661 Achilles tendinitis, right leg: Secondary | ICD-10-CM | POA: Diagnosis not present

## 2019-04-06 DIAGNOSIS — M810 Age-related osteoporosis without current pathological fracture: Secondary | ICD-10-CM | POA: Diagnosis not present

## 2019-04-06 DIAGNOSIS — M62561 Muscle wasting and atrophy, not elsewhere classified, right lower leg: Secondary | ICD-10-CM | POA: Diagnosis not present

## 2019-04-10 DIAGNOSIS — M7661 Achilles tendinitis, right leg: Secondary | ICD-10-CM | POA: Diagnosis not present

## 2019-04-10 DIAGNOSIS — R269 Unspecified abnormalities of gait and mobility: Secondary | ICD-10-CM | POA: Diagnosis not present

## 2019-04-10 DIAGNOSIS — M62571 Muscle wasting and atrophy, not elsewhere classified, right ankle and foot: Secondary | ICD-10-CM | POA: Diagnosis not present

## 2019-04-10 DIAGNOSIS — M62561 Muscle wasting and atrophy, not elsewhere classified, right lower leg: Secondary | ICD-10-CM | POA: Diagnosis not present

## 2019-04-10 DIAGNOSIS — M25671 Stiffness of right ankle, not elsewhere classified: Secondary | ICD-10-CM | POA: Diagnosis not present

## 2019-04-10 DIAGNOSIS — M25571 Pain in right ankle and joints of right foot: Secondary | ICD-10-CM | POA: Diagnosis not present

## 2019-04-10 DIAGNOSIS — M810 Age-related osteoporosis without current pathological fracture: Secondary | ICD-10-CM | POA: Diagnosis not present

## 2019-04-10 DIAGNOSIS — M25471 Effusion, right ankle: Secondary | ICD-10-CM | POA: Diagnosis not present

## 2019-04-11 DIAGNOSIS — R21 Rash and other nonspecific skin eruption: Secondary | ICD-10-CM | POA: Diagnosis not present

## 2019-04-12 DIAGNOSIS — M7661 Achilles tendinitis, right leg: Secondary | ICD-10-CM | POA: Diagnosis not present

## 2019-04-12 DIAGNOSIS — M810 Age-related osteoporosis without current pathological fracture: Secondary | ICD-10-CM | POA: Diagnosis not present

## 2019-04-12 DIAGNOSIS — M25571 Pain in right ankle and joints of right foot: Secondary | ICD-10-CM | POA: Diagnosis not present

## 2019-04-12 DIAGNOSIS — M62571 Muscle wasting and atrophy, not elsewhere classified, right ankle and foot: Secondary | ICD-10-CM | POA: Diagnosis not present

## 2019-04-12 DIAGNOSIS — M62561 Muscle wasting and atrophy, not elsewhere classified, right lower leg: Secondary | ICD-10-CM | POA: Diagnosis not present

## 2019-04-12 DIAGNOSIS — M25471 Effusion, right ankle: Secondary | ICD-10-CM | POA: Diagnosis not present

## 2019-04-12 DIAGNOSIS — R269 Unspecified abnormalities of gait and mobility: Secondary | ICD-10-CM | POA: Diagnosis not present

## 2019-04-12 DIAGNOSIS — M25671 Stiffness of right ankle, not elsewhere classified: Secondary | ICD-10-CM | POA: Diagnosis not present

## 2019-04-17 DIAGNOSIS — M25471 Effusion, right ankle: Secondary | ICD-10-CM | POA: Diagnosis not present

## 2019-04-17 DIAGNOSIS — M25671 Stiffness of right ankle, not elsewhere classified: Secondary | ICD-10-CM | POA: Diagnosis not present

## 2019-04-17 DIAGNOSIS — R269 Unspecified abnormalities of gait and mobility: Secondary | ICD-10-CM | POA: Diagnosis not present

## 2019-04-17 DIAGNOSIS — Z85828 Personal history of other malignant neoplasm of skin: Secondary | ICD-10-CM | POA: Diagnosis not present

## 2019-04-17 DIAGNOSIS — B353 Tinea pedis: Secondary | ICD-10-CM | POA: Diagnosis not present

## 2019-04-17 DIAGNOSIS — M810 Age-related osteoporosis without current pathological fracture: Secondary | ICD-10-CM | POA: Diagnosis not present

## 2019-04-17 DIAGNOSIS — M7661 Achilles tendinitis, right leg: Secondary | ICD-10-CM | POA: Diagnosis not present

## 2019-04-17 DIAGNOSIS — M62561 Muscle wasting and atrophy, not elsewhere classified, right lower leg: Secondary | ICD-10-CM | POA: Diagnosis not present

## 2019-04-17 DIAGNOSIS — M62571 Muscle wasting and atrophy, not elsewhere classified, right ankle and foot: Secondary | ICD-10-CM | POA: Diagnosis not present

## 2019-04-17 DIAGNOSIS — M25571 Pain in right ankle and joints of right foot: Secondary | ICD-10-CM | POA: Diagnosis not present

## 2019-04-17 DIAGNOSIS — C44722 Squamous cell carcinoma of skin of right lower limb, including hip: Secondary | ICD-10-CM | POA: Diagnosis not present

## 2019-04-19 DIAGNOSIS — M62561 Muscle wasting and atrophy, not elsewhere classified, right lower leg: Secondary | ICD-10-CM | POA: Diagnosis not present

## 2019-04-19 DIAGNOSIS — M25471 Effusion, right ankle: Secondary | ICD-10-CM | POA: Diagnosis not present

## 2019-04-19 DIAGNOSIS — M810 Age-related osteoporosis without current pathological fracture: Secondary | ICD-10-CM | POA: Diagnosis not present

## 2019-04-19 DIAGNOSIS — M62571 Muscle wasting and atrophy, not elsewhere classified, right ankle and foot: Secondary | ICD-10-CM | POA: Diagnosis not present

## 2019-04-19 DIAGNOSIS — M25571 Pain in right ankle and joints of right foot: Secondary | ICD-10-CM | POA: Diagnosis not present

## 2019-04-19 DIAGNOSIS — R269 Unspecified abnormalities of gait and mobility: Secondary | ICD-10-CM | POA: Diagnosis not present

## 2019-04-19 DIAGNOSIS — M7661 Achilles tendinitis, right leg: Secondary | ICD-10-CM | POA: Diagnosis not present

## 2019-04-19 DIAGNOSIS — M25671 Stiffness of right ankle, not elsewhere classified: Secondary | ICD-10-CM | POA: Diagnosis not present

## 2019-04-24 DIAGNOSIS — M25671 Stiffness of right ankle, not elsewhere classified: Secondary | ICD-10-CM | POA: Diagnosis not present

## 2019-04-24 DIAGNOSIS — M25571 Pain in right ankle and joints of right foot: Secondary | ICD-10-CM | POA: Diagnosis not present

## 2019-04-24 DIAGNOSIS — M7661 Achilles tendinitis, right leg: Secondary | ICD-10-CM | POA: Diagnosis not present

## 2019-04-24 DIAGNOSIS — M62561 Muscle wasting and atrophy, not elsewhere classified, right lower leg: Secondary | ICD-10-CM | POA: Diagnosis not present

## 2019-04-24 DIAGNOSIS — M62571 Muscle wasting and atrophy, not elsewhere classified, right ankle and foot: Secondary | ICD-10-CM | POA: Diagnosis not present

## 2019-04-24 DIAGNOSIS — R269 Unspecified abnormalities of gait and mobility: Secondary | ICD-10-CM | POA: Diagnosis not present

## 2019-04-24 DIAGNOSIS — M810 Age-related osteoporosis without current pathological fracture: Secondary | ICD-10-CM | POA: Diagnosis not present

## 2019-04-24 DIAGNOSIS — M25471 Effusion, right ankle: Secondary | ICD-10-CM | POA: Diagnosis not present

## 2019-04-26 DIAGNOSIS — M25471 Effusion, right ankle: Secondary | ICD-10-CM | POA: Diagnosis not present

## 2019-04-26 DIAGNOSIS — M810 Age-related osteoporosis without current pathological fracture: Secondary | ICD-10-CM | POA: Diagnosis not present

## 2019-04-26 DIAGNOSIS — R269 Unspecified abnormalities of gait and mobility: Secondary | ICD-10-CM | POA: Diagnosis not present

## 2019-04-26 DIAGNOSIS — M25571 Pain in right ankle and joints of right foot: Secondary | ICD-10-CM | POA: Diagnosis not present

## 2019-04-26 DIAGNOSIS — M7661 Achilles tendinitis, right leg: Secondary | ICD-10-CM | POA: Diagnosis not present

## 2019-04-26 DIAGNOSIS — M25671 Stiffness of right ankle, not elsewhere classified: Secondary | ICD-10-CM | POA: Diagnosis not present

## 2019-04-26 DIAGNOSIS — M62571 Muscle wasting and atrophy, not elsewhere classified, right ankle and foot: Secondary | ICD-10-CM | POA: Diagnosis not present

## 2019-04-26 DIAGNOSIS — M62561 Muscle wasting and atrophy, not elsewhere classified, right lower leg: Secondary | ICD-10-CM | POA: Diagnosis not present

## 2019-04-30 DIAGNOSIS — J471 Bronchiectasis with (acute) exacerbation: Secondary | ICD-10-CM | POA: Diagnosis not present

## 2019-04-30 DIAGNOSIS — Z0100 Encounter for examination of eyes and vision without abnormal findings: Secondary | ICD-10-CM | POA: Diagnosis not present

## 2019-05-02 DIAGNOSIS — M25471 Effusion, right ankle: Secondary | ICD-10-CM | POA: Diagnosis not present

## 2019-05-02 DIAGNOSIS — M7661 Achilles tendinitis, right leg: Secondary | ICD-10-CM | POA: Diagnosis not present

## 2019-05-02 DIAGNOSIS — R269 Unspecified abnormalities of gait and mobility: Secondary | ICD-10-CM | POA: Diagnosis not present

## 2019-05-02 DIAGNOSIS — M62561 Muscle wasting and atrophy, not elsewhere classified, right lower leg: Secondary | ICD-10-CM | POA: Diagnosis not present

## 2019-05-02 DIAGNOSIS — M25671 Stiffness of right ankle, not elsewhere classified: Secondary | ICD-10-CM | POA: Diagnosis not present

## 2019-05-02 DIAGNOSIS — M62571 Muscle wasting and atrophy, not elsewhere classified, right ankle and foot: Secondary | ICD-10-CM | POA: Diagnosis not present

## 2019-05-02 DIAGNOSIS — M25571 Pain in right ankle and joints of right foot: Secondary | ICD-10-CM | POA: Diagnosis not present

## 2019-05-02 DIAGNOSIS — M810 Age-related osteoporosis without current pathological fracture: Secondary | ICD-10-CM | POA: Diagnosis not present

## 2019-05-09 ENCOUNTER — Other Ambulatory Visit: Payer: Self-pay

## 2019-05-09 ENCOUNTER — Encounter: Payer: Self-pay | Admitting: Podiatry

## 2019-05-09 ENCOUNTER — Ambulatory Visit (INDEPENDENT_AMBULATORY_CARE_PROVIDER_SITE_OTHER): Payer: Medicare HMO | Admitting: Podiatry

## 2019-05-09 VITALS — Temp 98.6°F

## 2019-05-09 DIAGNOSIS — L84 Corns and callosities: Secondary | ICD-10-CM

## 2019-05-09 DIAGNOSIS — M62571 Muscle wasting and atrophy, not elsewhere classified, right ankle and foot: Secondary | ICD-10-CM | POA: Diagnosis not present

## 2019-05-09 DIAGNOSIS — M79674 Pain in right toe(s): Secondary | ICD-10-CM | POA: Diagnosis not present

## 2019-05-09 DIAGNOSIS — B351 Tinea unguium: Secondary | ICD-10-CM | POA: Diagnosis not present

## 2019-05-09 DIAGNOSIS — M7661 Achilles tendinitis, right leg: Secondary | ICD-10-CM

## 2019-05-09 DIAGNOSIS — M25671 Stiffness of right ankle, not elsewhere classified: Secondary | ICD-10-CM | POA: Diagnosis not present

## 2019-05-09 DIAGNOSIS — M25571 Pain in right ankle and joints of right foot: Secondary | ICD-10-CM | POA: Diagnosis not present

## 2019-05-09 DIAGNOSIS — M810 Age-related osteoporosis without current pathological fracture: Secondary | ICD-10-CM | POA: Diagnosis not present

## 2019-05-09 DIAGNOSIS — M25471 Effusion, right ankle: Secondary | ICD-10-CM | POA: Diagnosis not present

## 2019-05-09 DIAGNOSIS — M79675 Pain in left toe(s): Secondary | ICD-10-CM | POA: Diagnosis not present

## 2019-05-09 DIAGNOSIS — R269 Unspecified abnormalities of gait and mobility: Secondary | ICD-10-CM | POA: Diagnosis not present

## 2019-05-09 DIAGNOSIS — M62561 Muscle wasting and atrophy, not elsewhere classified, right lower leg: Secondary | ICD-10-CM | POA: Diagnosis not present

## 2019-05-09 NOTE — Patient Instructions (Signed)

## 2019-05-16 DIAGNOSIS — M7661 Achilles tendinitis, right leg: Secondary | ICD-10-CM | POA: Diagnosis not present

## 2019-05-16 DIAGNOSIS — M62561 Muscle wasting and atrophy, not elsewhere classified, right lower leg: Secondary | ICD-10-CM | POA: Diagnosis not present

## 2019-05-16 DIAGNOSIS — R269 Unspecified abnormalities of gait and mobility: Secondary | ICD-10-CM | POA: Diagnosis not present

## 2019-05-16 DIAGNOSIS — M810 Age-related osteoporosis without current pathological fracture: Secondary | ICD-10-CM | POA: Diagnosis not present

## 2019-05-16 DIAGNOSIS — M62571 Muscle wasting and atrophy, not elsewhere classified, right ankle and foot: Secondary | ICD-10-CM | POA: Diagnosis not present

## 2019-05-16 DIAGNOSIS — M25671 Stiffness of right ankle, not elsewhere classified: Secondary | ICD-10-CM | POA: Diagnosis not present

## 2019-05-16 DIAGNOSIS — M25571 Pain in right ankle and joints of right foot: Secondary | ICD-10-CM | POA: Diagnosis not present

## 2019-05-16 DIAGNOSIS — M25471 Effusion, right ankle: Secondary | ICD-10-CM | POA: Diagnosis not present

## 2019-05-18 NOTE — Progress Notes (Signed)
Subjective:  [name] presents to clinic today with cc of  painful, thick, discolored, elongated toenails 1-5 b/l and painful corns that become painful when ambulating.  Pain is aggravated when wearing enclosed shoe gear.  She is also here for follow up achilles tendonitis right lower extremity. She has been going to physical therapy and feels it is helping. She has had improvement, but not resolution of symptoms. She has four sessions remaining.  Elizabeth Manes, MD is her PCP.   Current Outpatient Medications:  .  Acetaminophen (TYLENOL EXTRA STRENGTH PO), Take 350 mg by mouth as needed. , Disp: , Rfl:  .  benzonatate (TESSALON) 200 MG capsule, Take 1 capsule (200 mg total) by mouth 3 (three) times daily as needed for cough. (Patient taking differently: Take 200 mg by mouth as needed for cough. ), Disp: 30 capsule, Rfl: 2 .  DEXILANT 60 MG capsule, TAKE 1 CAPSULE DAILY, Disp: 90 capsule, Rfl: 3 .  doxycycline (VIBRA-TABS) 100 MG tablet, daily., Disp: , Rfl:  .  fexofenadine (ALLEGRA) 180 MG tablet, Take 180 mg by mouth daily., Disp: , Rfl:  .  Gabapentin 300 MG/6ML SOLN, Take 2 mLs by mouth at bedtime., Disp: 470 mL, Rfl: 11 .  Respiratory Therapy Supplies (FLUTTER) DEVI, Use as directed., Disp: 1 each, Rfl: 0 .  sodium chloride HYPERTONIC 3 % nebulizer solution, Take by nebulization as needed for other., Disp: 750 mL, Rfl: 12 .  sucralfate (CARAFATE) 1 GM/10ML suspension, Take 10 mLs (1 g total) by mouth every 6 (six) hours as needed., Disp: 420 mL, Rfl: 1 .  terbinafine (LAMISIL) 250 MG tablet, , Disp: , Rfl:  .  Thiamine Mononitrate (VITAMIN B1 PO), Take 1 tablet by mouth as needed. , Disp: , Rfl:  .  triamcinolone (KENALOG) 0.025 % cream, , Disp: , Rfl:  .  TURMERIC PO, Take 1 tablet by mouth as needed., Disp: , Rfl:    Allergies  Allergen Reactions  . Cortisone     unsure  . Prednisone   . Sulfasalazine Other (See Comments)    Unsure of reaction  . Sulfonamide Derivatives     Unsure of reaction     Objective: Vitals:   05/09/19 1427  Temp: 98.6 F (37 C)    Physical Examination:  Vascular Examination: Capillary refill time immediate x 10 digits.  Palpable DP/PT pulses b/l.  Digital hair sparse b/l.  No edema noted b/l.  Skin temperature gradient WNL b/l.  Dermatological Examination: Skin thin and atrophic b/l.  No open wounds b/l.  No interdigital macerations noted b/l.  Elongated, thick, discolored brittle toenails with subungual debris and pain on dorsal palpation of nailbeds 1-5 b/l.  Hyperkeratotic lesion distal tip left 3rd digit. No erythema, no edema, no drainage, no flocculence noted.  Musculoskeletal Examination: Muscle strength 5/5 to all muscle groups b/l.  Minimal pain on palpation at insertion of achilles tendon right LE. No erythema, no edema, no flocculence, no palpable defect along Achilles tendon.  No pain, crepitus or joint discomfort with active/passive ROM.  Neurological Examination: Sensation intact 5/5 b/l with 10 gram monofilament.  Vibratory sensation intact b/l.  Proprioceptive sensation intact b/l.  Assessment: Mycotic nail infection with pain 1-5 b/l Corn distal tip left 3rd digit Achilles tendonitis right LE  Plan: 1. For Achilles tendonitis right LE, continue physical therapy. 2. Toenails 1-5 b/l were debrided in length and girth without iatrogenic laceration. Corn(s) pared left 3rd digit utilizing sterile scalpel blade without incident. 3. Continue  soft, supportive shoe gear daily. 4. Report any pedal injuries to medical professional. 5. Follow up 9 weeks. 6. Patient/POA to call should there be a question/concern in there interim.

## 2019-05-22 DIAGNOSIS — R69 Illness, unspecified: Secondary | ICD-10-CM | POA: Diagnosis not present

## 2019-05-24 DIAGNOSIS — M62561 Muscle wasting and atrophy, not elsewhere classified, right lower leg: Secondary | ICD-10-CM | POA: Diagnosis not present

## 2019-05-24 DIAGNOSIS — M810 Age-related osteoporosis without current pathological fracture: Secondary | ICD-10-CM | POA: Diagnosis not present

## 2019-05-24 DIAGNOSIS — M25571 Pain in right ankle and joints of right foot: Secondary | ICD-10-CM | POA: Diagnosis not present

## 2019-05-24 DIAGNOSIS — M25471 Effusion, right ankle: Secondary | ICD-10-CM | POA: Diagnosis not present

## 2019-05-24 DIAGNOSIS — M62571 Muscle wasting and atrophy, not elsewhere classified, right ankle and foot: Secondary | ICD-10-CM | POA: Diagnosis not present

## 2019-05-24 DIAGNOSIS — R269 Unspecified abnormalities of gait and mobility: Secondary | ICD-10-CM | POA: Diagnosis not present

## 2019-05-24 DIAGNOSIS — M25671 Stiffness of right ankle, not elsewhere classified: Secondary | ICD-10-CM | POA: Diagnosis not present

## 2019-05-24 DIAGNOSIS — M7661 Achilles tendinitis, right leg: Secondary | ICD-10-CM | POA: Diagnosis not present

## 2019-05-30 DIAGNOSIS — J471 Bronchiectasis with (acute) exacerbation: Secondary | ICD-10-CM | POA: Diagnosis not present

## 2019-06-04 ENCOUNTER — Other Ambulatory Visit: Payer: Self-pay

## 2019-06-04 MED ORDER — DEXILANT 60 MG PO CPDR: 1.0000 | DELAYED_RELEASE_CAPSULE | Freq: Every day | ORAL | 1 refills | Status: DC

## 2019-06-04 NOTE — Telephone Encounter (Signed)
Patient up to date on her visits, Dexilant refilled.

## 2019-06-07 DIAGNOSIS — M25571 Pain in right ankle and joints of right foot: Secondary | ICD-10-CM | POA: Diagnosis not present

## 2019-06-07 DIAGNOSIS — M810 Age-related osteoporosis without current pathological fracture: Secondary | ICD-10-CM | POA: Diagnosis not present

## 2019-06-07 DIAGNOSIS — R269 Unspecified abnormalities of gait and mobility: Secondary | ICD-10-CM | POA: Diagnosis not present

## 2019-06-07 DIAGNOSIS — M25471 Effusion, right ankle: Secondary | ICD-10-CM | POA: Diagnosis not present

## 2019-06-07 DIAGNOSIS — M62561 Muscle wasting and atrophy, not elsewhere classified, right lower leg: Secondary | ICD-10-CM | POA: Diagnosis not present

## 2019-06-07 DIAGNOSIS — M25671 Stiffness of right ankle, not elsewhere classified: Secondary | ICD-10-CM | POA: Diagnosis not present

## 2019-06-07 DIAGNOSIS — M62571 Muscle wasting and atrophy, not elsewhere classified, right ankle and foot: Secondary | ICD-10-CM | POA: Diagnosis not present

## 2019-06-07 DIAGNOSIS — M7661 Achilles tendinitis, right leg: Secondary | ICD-10-CM | POA: Diagnosis not present

## 2019-06-11 ENCOUNTER — Other Ambulatory Visit: Payer: Self-pay | Admitting: Gastroenterology

## 2019-06-11 ENCOUNTER — Other Ambulatory Visit: Payer: Self-pay

## 2019-06-15 DIAGNOSIS — M810 Age-related osteoporosis without current pathological fracture: Secondary | ICD-10-CM | POA: Diagnosis not present

## 2019-06-15 DIAGNOSIS — M25571 Pain in right ankle and joints of right foot: Secondary | ICD-10-CM | POA: Diagnosis not present

## 2019-06-15 DIAGNOSIS — M7661 Achilles tendinitis, right leg: Secondary | ICD-10-CM | POA: Diagnosis not present

## 2019-06-15 DIAGNOSIS — M25671 Stiffness of right ankle, not elsewhere classified: Secondary | ICD-10-CM | POA: Diagnosis not present

## 2019-06-15 DIAGNOSIS — R269 Unspecified abnormalities of gait and mobility: Secondary | ICD-10-CM | POA: Diagnosis not present

## 2019-06-15 DIAGNOSIS — M62571 Muscle wasting and atrophy, not elsewhere classified, right ankle and foot: Secondary | ICD-10-CM | POA: Diagnosis not present

## 2019-06-15 DIAGNOSIS — M25471 Effusion, right ankle: Secondary | ICD-10-CM | POA: Diagnosis not present

## 2019-06-15 DIAGNOSIS — M62561 Muscle wasting and atrophy, not elsewhere classified, right lower leg: Secondary | ICD-10-CM | POA: Diagnosis not present

## 2019-06-21 DIAGNOSIS — M25471 Effusion, right ankle: Secondary | ICD-10-CM | POA: Diagnosis not present

## 2019-06-21 DIAGNOSIS — R269 Unspecified abnormalities of gait and mobility: Secondary | ICD-10-CM | POA: Diagnosis not present

## 2019-06-21 DIAGNOSIS — M62561 Muscle wasting and atrophy, not elsewhere classified, right lower leg: Secondary | ICD-10-CM | POA: Diagnosis not present

## 2019-06-21 DIAGNOSIS — M810 Age-related osteoporosis without current pathological fracture: Secondary | ICD-10-CM | POA: Diagnosis not present

## 2019-06-21 DIAGNOSIS — M25571 Pain in right ankle and joints of right foot: Secondary | ICD-10-CM | POA: Diagnosis not present

## 2019-06-21 DIAGNOSIS — M62571 Muscle wasting and atrophy, not elsewhere classified, right ankle and foot: Secondary | ICD-10-CM | POA: Diagnosis not present

## 2019-06-21 DIAGNOSIS — M25671 Stiffness of right ankle, not elsewhere classified: Secondary | ICD-10-CM | POA: Diagnosis not present

## 2019-06-21 DIAGNOSIS — M7661 Achilles tendinitis, right leg: Secondary | ICD-10-CM | POA: Diagnosis not present

## 2019-06-29 DIAGNOSIS — M25671 Stiffness of right ankle, not elsewhere classified: Secondary | ICD-10-CM | POA: Diagnosis not present

## 2019-06-29 DIAGNOSIS — M25471 Effusion, right ankle: Secondary | ICD-10-CM | POA: Diagnosis not present

## 2019-06-29 DIAGNOSIS — M810 Age-related osteoporosis without current pathological fracture: Secondary | ICD-10-CM | POA: Diagnosis not present

## 2019-06-29 DIAGNOSIS — M62561 Muscle wasting and atrophy, not elsewhere classified, right lower leg: Secondary | ICD-10-CM | POA: Diagnosis not present

## 2019-06-29 DIAGNOSIS — M62571 Muscle wasting and atrophy, not elsewhere classified, right ankle and foot: Secondary | ICD-10-CM | POA: Diagnosis not present

## 2019-06-29 DIAGNOSIS — R269 Unspecified abnormalities of gait and mobility: Secondary | ICD-10-CM | POA: Diagnosis not present

## 2019-06-29 DIAGNOSIS — M7661 Achilles tendinitis, right leg: Secondary | ICD-10-CM | POA: Diagnosis not present

## 2019-06-29 DIAGNOSIS — M25571 Pain in right ankle and joints of right foot: Secondary | ICD-10-CM | POA: Diagnosis not present

## 2019-06-30 DIAGNOSIS — J471 Bronchiectasis with (acute) exacerbation: Secondary | ICD-10-CM | POA: Diagnosis not present

## 2019-07-02 DIAGNOSIS — I8311 Varicose veins of right lower extremity with inflammation: Secondary | ICD-10-CM | POA: Diagnosis not present

## 2019-07-02 DIAGNOSIS — L570 Actinic keratosis: Secondary | ICD-10-CM | POA: Diagnosis not present

## 2019-07-02 DIAGNOSIS — Z85828 Personal history of other malignant neoplasm of skin: Secondary | ICD-10-CM | POA: Diagnosis not present

## 2019-07-02 DIAGNOSIS — L738 Other specified follicular disorders: Secondary | ICD-10-CM | POA: Diagnosis not present

## 2019-07-02 DIAGNOSIS — I8312 Varicose veins of left lower extremity with inflammation: Secondary | ICD-10-CM | POA: Diagnosis not present

## 2019-07-02 DIAGNOSIS — L986 Other infiltrative disorders of the skin and subcutaneous tissue: Secondary | ICD-10-CM | POA: Diagnosis not present

## 2019-07-02 DIAGNOSIS — D485 Neoplasm of uncertain behavior of skin: Secondary | ICD-10-CM | POA: Diagnosis not present

## 2019-07-10 ENCOUNTER — Other Ambulatory Visit: Payer: Self-pay

## 2019-07-10 ENCOUNTER — Ambulatory Visit (INDEPENDENT_AMBULATORY_CARE_PROVIDER_SITE_OTHER): Payer: Medicare HMO | Admitting: Podiatry

## 2019-07-10 DIAGNOSIS — L84 Corns and callosities: Secondary | ICD-10-CM | POA: Diagnosis not present

## 2019-07-10 DIAGNOSIS — M7661 Achilles tendinitis, right leg: Secondary | ICD-10-CM | POA: Diagnosis not present

## 2019-07-10 DIAGNOSIS — M79675 Pain in left toe(s): Secondary | ICD-10-CM

## 2019-07-10 DIAGNOSIS — M79674 Pain in right toe(s): Secondary | ICD-10-CM | POA: Diagnosis not present

## 2019-07-10 DIAGNOSIS — B351 Tinea unguium: Secondary | ICD-10-CM

## 2019-07-10 MED ORDER — NONFORMULARY OR COMPOUNDED ITEM: 3 refills | Status: DC

## 2019-07-10 NOTE — Patient Instructions (Signed)

## 2019-07-12 ENCOUNTER — Other Ambulatory Visit: Payer: Self-pay

## 2019-07-12 ENCOUNTER — Other Ambulatory Visit (INDEPENDENT_AMBULATORY_CARE_PROVIDER_SITE_OTHER): Payer: Medicare HMO

## 2019-07-12 ENCOUNTER — Encounter: Payer: Self-pay | Admitting: Neurology

## 2019-07-12 ENCOUNTER — Ambulatory Visit (INDEPENDENT_AMBULATORY_CARE_PROVIDER_SITE_OTHER): Payer: Medicare HMO | Admitting: Neurology

## 2019-07-12 VITALS — BP 126/79 | HR 64 | Ht 64.0 in | Wt 82.0 lb

## 2019-07-12 DIAGNOSIS — M25571 Pain in right ankle and joints of right foot: Secondary | ICD-10-CM | POA: Diagnosis not present

## 2019-07-12 DIAGNOSIS — M25671 Stiffness of right ankle, not elsewhere classified: Secondary | ICD-10-CM | POA: Diagnosis not present

## 2019-07-12 DIAGNOSIS — R269 Unspecified abnormalities of gait and mobility: Secondary | ICD-10-CM | POA: Diagnosis not present

## 2019-07-12 DIAGNOSIS — I872 Venous insufficiency (chronic) (peripheral): Secondary | ICD-10-CM | POA: Diagnosis not present

## 2019-07-12 DIAGNOSIS — G629 Polyneuropathy, unspecified: Secondary | ICD-10-CM | POA: Diagnosis not present

## 2019-07-12 DIAGNOSIS — M62561 Muscle wasting and atrophy, not elsewhere classified, right lower leg: Secondary | ICD-10-CM | POA: Diagnosis not present

## 2019-07-12 DIAGNOSIS — M62571 Muscle wasting and atrophy, not elsewhere classified, right ankle and foot: Secondary | ICD-10-CM | POA: Diagnosis not present

## 2019-07-12 DIAGNOSIS — M810 Age-related osteoporosis without current pathological fracture: Secondary | ICD-10-CM | POA: Diagnosis not present

## 2019-07-12 DIAGNOSIS — M7661 Achilles tendinitis, right leg: Secondary | ICD-10-CM | POA: Diagnosis not present

## 2019-07-12 DIAGNOSIS — M25471 Effusion, right ankle: Secondary | ICD-10-CM | POA: Diagnosis not present

## 2019-07-12 NOTE — Patient Instructions (Addendum)
1. Bloodwork TSH, B12, B1, ESR, CRP, copper, SPEP, IFE, folate, ANA, SS-A, SS-B. Go to [address] on the 2nd floor.  2. Try the compounded cream prescribed  3. Refer to [facility] Neuromuscular clinic for neuropathy with skin changes. [facility] will call you and schedule this appointment.  4. Continue all your medications  5. Follow-up in 6 months, call for any changes

## 2019-07-12 NOTE — Progress Notes (Signed)
NEUROLOGY FOLLOW UP OFFICE NOTE  JANN DEWAN QM:[id number]  DOB: July 09, 1941  HISTORY OF PRESENT ILLNESS: I had the pleasure of seeing [name] in follow-up in the neurology clinic on 07/12/2019.  The patient was last seen 8 months ago for neuropathy. She has neuropathy with constant burning and numbness in both legs. She also has chronic venous insufficiency, duplex showed limited valvular insufficiency.  She also has hand weakness and most recent EMG/NCV of the right UE and LE confirmed moderate chronic sensorimotor axonal polyneuropathy. There was also note of moderate right ulnar neuropathy with slowing across the elbow and mild right median neuropathy consistent with carpal tunnel syndrome. She presents for an earlier visit due to a flare up of her feet symptoms. There is dark discoloration seen on the lateral side of her feet and big toe. She reports the left foot gets swollen, the right foot less so but also red. She reports her feet are very prickly and sensitive to everything. She has seen a dermatologist who "did not know what it was" She was given a medication she took for 2 months which did not help. She continues to go to PT for balance therapy. She reports they have noticed the swelling as well. There is no relief with cold temperatures.   HPI: This is a pleasant 78 yo RH woman with a history of GERD, nutcracker esophagus, pulmonary fibrosis, in her usual state of health until early June 2017 while walking into a grocery she suddenly had left temporal pain, then it felt like she was hit on both side of the head with a hammer. She reports she was "hit like gangbusters," she was very dizzy with a spinning sensation, nauseated. She held on to the cart for balance and had difficulty concentrating. Her vision was blurred. She managed to finish her chore and walked to the car, then noticed that she was having a hard time talking. She could see the sentence broken up in her head and could not get  the words out. She was able to drive home with no focal symptoms noted. The episode lasted 30 minutes or so, she felt weak and tired after. Since then she has had at least 2 or 3 more episodes of headaches with dizziness, which did not progress to speech difficulties. The symptoms would last around 5 minutes, no clear positional component to the dizziness. When she feels it coming on, she massages her head, which seems to help. She was unsure if Tylenol helped. She denies any prior history of headaches. She denies any vision loss, photo/phonophobia, diplopia, dysarthria, jaw claudication, hearing loss. She has occasional pulsatile tinnitus more in the afternoons. She has GERD which makes swallowing difficult. She has a history of neuropathy affecting both feet, R>L, with burning and numbness/tingling for several years. She has been doing laser therapy for venous insufficiency/varicose veins. She denies any falls but trips more. Sleep varies. She is very active and rides her bike for a couple of hours daily. She works at a preschool. Her mother had a stroke at age 49.   PAST MEDICAL HISTORY: Past Medical History:  Diagnosis Date   Allergy    Arthritis    BOOP (bronchiolitis obliterans with organizing pneumonia) ([place])    Bronchiectasis    Cataract    Esophageal dysmotility    Gastroparesis    GERD (gastroesophageal reflux disease)    severe   Iron deficiency anemia    Neuropathy    Nutcracker esophagus  Pneumonia    Raynaud's disease    Right lower lobe pneumonia December 2013    MEDICATIONS:  Outpatient Encounter Medications as of 07/12/2019  Medication Sig   Acetaminophen (TYLENOL EXTRA STRENGTH PO) Take 350 mg by mouth as needed.    benzonatate (TESSALON) 200 MG capsule Take 1 capsule (200 mg total) by mouth 3 (three) times daily as needed for cough. (Patient taking differently: Take 200 mg by mouth as needed for cough. )   dexlansoprazole (DEXILANT) 60 MG capsule Take 1  capsule (60 mg total) by mouth daily.   doxycycline (VIBRA-TABS) 100 MG tablet daily.   fexofenadine (ALLEGRA) 180 MG tablet Take 180 mg by mouth daily.   Gabapentin 300 MG/6ML SOLN Take 2 mLs by mouth at bedtime.   NONFORMULARY OR COMPOUNDED ITEM Peripheral Neuropathy Cream: Bupivacaine 1%, Doxepin 3%, Gabapentin 6%, Pentoxifylline 3%, Topiramate 1% Order faxed to [facility] (FLUTTER) DEVI Use as directed.   sodium chloride HYPERTONIC 3 % nebulizer solution Take by nebulization as needed for other.   sucralfate (CARAFATE) 1 GM/10ML suspension TAKE 2 TEASPOONFULS EVERY 6 HOURS AS NEEDED.   terbinafine (LAMISIL) 250 MG tablet    Thiamine Mononitrate (VITAMIN B1 PO) Take 1 tablet by mouth as needed.    triamcinolone (KENALOG) 0.025 % cream    TURMERIC PO Take 1 tablet by mouth as needed.   No facility-administered encounter medications on file as of 07/12/2019.     ALLERGIES: Allergies  Allergen Reactions   Cortisone     unsure   Prednisone    Sulfasalazine Other (See Comments)    Unsure of reaction   Sulfonamide Derivatives     Unsure of reaction    FAMILY HISTORY: Family History  Problem Relation Age of Onset   Lymphoma Mother    Heart disease Father    Lung cancer Paternal Uncle    Heart disease Paternal Grandfather    Leukemia Paternal Grandmother    Cancer Maternal Grandmother        unknown type   Colon cancer Neg Hx    Esophageal cancer Neg Hx    Rectal cancer Neg Hx    Stomach cancer Neg Hx     SOCIAL HISTORY: Social History   Socioeconomic History   Marital status: Widowed    Spouse name: Not on file   Number of children: 2   Years of education: Not on file   Highest education level: Not on file  Occupational History   Occupation: retired Pharmacist, hospital    Comment: still teaching pre [employer] resource strain: Not on file   Food insecurity    Worry: Not on file     Inability: Not on file   Transportation needs    Medical: Not on file    Non-medical: Not on file  Tobacco Use   Smoking status: Former Smoker    Types: Cigarettes    Quit date: 11/15/1968    Years since quitting: 50.6   Smokeless tobacco: Never Used   Tobacco comment: 3 cigs a day  Substance and Sexual Activity   Alcohol use: No   Drug use: No   Sexual activity: Not on file  Lifestyle   Physical activity    Days per week: Not on file    Minutes per session: Not on file   Stress: Not on file  Relationships   Social connections    Talks on phone: Not on file  Gets together: Not on file    Attends religious service: Not on file    Active member of club or organization: Not on file    Attends meetings of clubs or organizations: Not on file    Relationship status: Not on file   Intimate partner violence    Fear of current or ex partner: Not on file    Emotionally abused: Not on file    Physically abused: Not on file    Forced sexual activity: Not on file  Other Topics Concern   Not on file  Social History Narrative   Still working as a Print production planner at [employer].     REVIEW OF SYSTEMS: Constitutional: No fevers, chills, or sweats, no generalized fatigue, change in appetite Eyes: No visual changes, double vision, eye pain Ear, nose and throat: No hearing loss, ear pain, nasal congestion, sore throat Cardiovascular: No chest pain, palpitations Respiratory:  No shortness of breath at rest or with exertion, wheezes GastrointestinaI: No nausea, vomiting, diarrhea, abdominal pain, fecal incontinence Genitourinary:  No dysuria, urinary retention or frequency Musculoskeletal:  + neck pain, back pain Integumentary: No rash, pruritus, skin lesions Neurological: as above Psychiatric: No depression, insomnia, anxiety Endocrine: No palpitations, fatigue, diaphoresis, mood swings, change in appetite, change in weight, increased thirst Hematologic/Lymphatic:  No  anemia, purpura, petechiae. Allergic/Immunologic: no itchy/runny eyes, nasal congestion, recent allergic reactions, rashes  PHYSICAL EXAM: Vitals:   07/12/19 1243  BP: 126/79  Pulse: 64  SpO2: 94%   General: No acute distress Skin/Extremities: No rash, no edema, +varicose veins, both feet with dark discoloration at the lateral sides of both feet and big toes, L>R. No clear edema seen today, tender to touch Neurological Exam: alert and oriented to person, place, and time. No aphasia or dysarthria. Fund of knowledge is appropriate.  Recent and remote memory are intact.  Attention and concentration are normal.    Able to name objects and repeat phrases. Cranial nerves: Pupils equal, round, reactive to light. Extraocular movements intact with no nystagmus. Visual fields full. Facial sensation intact. No facial asymmetry. Tongue, uvula, palate midline.  Motor: Bulk and tone normal, muscle strength 5/5 proximally on both UE, 4/5 distally except for 3+/5 bilateral APB right 5th digit abduction, 5/5 bilateral proximal UE, 4/5 right foot dorsiflexion, 5/5 plantar flexion, 3/5 bilateral eversion/inversion. Sensation decreased to pin above wrists bilaterally, decreased pin to mid-calf bilaterally, decreased cold on both feet, decreased vibration to knees bilaterally (similar to prior). Deep tendon reflexes +1 throughout, toes downgoing.  Finger to nose testing intact.  Gait slow and cautious, difficulty with tandem walk. Romberg negative.   IMPRESSION: This is a pleasant 78 yo RH woman with a history of GERD, nutcracker esophagus, pulmonary fibrosis, initially seen for transient episode of headache, vertigo, and expressive aphasia, MRI brain no acute changes. Her MRA head and neck was unremarkable except for moderate stenosis of the left P1 and diminished distal flow-related signal within the left PCA distribution. No further episodes since June 2017, continue daily aspirin and control of vascular risk factors.  She presents for an urgent visit to show the flare up in her feet with significant pain and color changes. Etiology unclear, she does have neuropathy, however this would not cause swelling. Erythromelalgia is considered but there is no response to cold, which is unusual. CRPS can present with similar symptoms, but there is no prior history of trauma and atypical to affect both feet. Neuropathy labs will again be ordered, she would like  a referral to a tertiary center for evaluation. She was encouraged to try the compounding cream prescribed by Podiatry. She is on a very low dose of gabapentin but declines increase in dose. Follow-up in 6 months, she knows to call for any changes.   Thank you for allowing me to participate in her care.  Please do not hesitate to call for any questions or concerns.   Ellouise Newer, M.D.   CC: Dr. Felipa Eth

## 2019-07-13 LAB — SEDIMENTATION RATE: Sed Rate: 2 mm/h (ref 0–30)

## 2019-07-13 LAB — B12 AND FOLATE PANEL
Folate: 3.9 ng/mL — ABNORMAL LOW
Vitamin B-12: 1156 pg/mL — ABNORMAL HIGH (ref 200–1100)

## 2019-07-16 LAB — SJOGRENS SYNDROME-A EXTRACTABLE NUCLEAR ANTIBODY: SSA (Ro) (ENA) Antibody, IgG: <1 AI

## 2019-07-16 LAB — PROTEIN ELECTROPHORESIS, SERUM
Albumin ELP: 4.2 g/dL (ref 3.8–4.8)
Alpha 1: 0.3 g/dL (ref 0.2–0.3)
Alpha 2: 0.8 g/dL (ref 0.5–0.9)
Beta 2: 0.4 g/dL (ref 0.2–0.5)
Beta Globulin: 0.5 g/dL (ref 0.4–0.6)
Gamma Globulin: 1 g/dL (ref 0.8–1.7)
Total Protein: 7.1 g/dL (ref 6.1–8.1)

## 2019-07-16 LAB — IMMUNOFIXATION ELECTROPHORESIS
IgG (Immunoglobin G), Serum: 994 mg/dL (ref 600–1540)
IgM, Serum: 59 mg/dL (ref 50–300)
Immunofix Electr Int: NOT DETECTED
Immunoglobulin A: 342 mg/dL — ABNORMAL HIGH (ref 70–320)

## 2019-07-16 LAB — ANTI-NUCLEAR AB-TITER (ANA TITER): ANA Titer 1: 1:40 {titer} — ABNORMAL HIGH

## 2019-07-16 LAB — C-REACTIVE PROTEIN: CRP: 0.4 mg/L (ref <8.0)

## 2019-07-16 LAB — VITAMIN B1: Vitamin B1 (Thiamine): 10 nmol/L (ref 8–30)

## 2019-07-16 LAB — COPPER, SERUM: Copper: 129 ug/dL (ref 70–175)

## 2019-07-16 LAB — SJOGRENS SYNDROME-B EXTRACTABLE NUCLEAR ANTIBODY: SSB (La) (ENA) Antibody, IgG: <1 AI

## 2019-07-16 LAB — TSH: TSH: 5.34 mIU/L — ABNORMAL HIGH (ref 0.40–4.50)

## 2019-07-16 LAB — ANA: Anti Nuclear Antibody (ANA): POSITIVE — AB

## 2019-07-19 ENCOUNTER — Encounter: Payer: Self-pay | Admitting: Podiatry

## 2019-07-19 NOTE — Progress Notes (Signed)
Subjective: [name] presents to clinic with cc of painful mycotic toenails and corn which are aggravated when weightbearing with and without shoe gear.  This pain limits her daily activities. Pain symptoms resolve with periodic professional debridement.  [name], MD is her PCP.   She states she still feels pain in her Achilles and says she thinks she will always have pain. She is still undergoing PT.   She still uses detox pads daily.    Allergies  Allergen Reactions  . Cortisone     unsure  . Prednisone   . Sulfasalazine Other (See Comments)    Unsure of reaction  . Sulfonamide Derivatives     Unsure of reaction     Objective: Physical Examination:  Vascular  Examination: Capillary refill time immediate x 10 digits.  Palpable DP/PT pulses b/l.  Digital hair sparse b/l.  No edema noted b/l.  Skin temperature gradient WNL b/l.  Dermatological Examination: Skin thin and atrophic b/l No open wounds b/l.  No interdigital macerations noted b/l.  Elongated, thick, discolored brittle toenails with subungual debris and pain on dorsal palpation of nailbeds 1-5 b/l.  Hyperkeratotic lesion distal tip left 3rd digit. No erythema, no edema, no drainage, no flocculence noted.  Musculoskeletal Examination: Muscle strength 5/5 to all muscle groups b/l. No evidence of rupture. No erythema, no edema.   Mild tenderness to palpation insertion of Achilles tendon right LE.   No pain, crepitus or joint discomfort with active/passive ROM.  Neurological Examination: Sensation intact 5/5 b/l with 10 gram monofilament.  Vibratory sensation intact b/l.  Proprioceptive sensation intact b/l.  Assessment: 1. Mycotic nail infection with pain 1-5 b/l 2. Corn distal tip left 3rd digit 3. Achilles tendonitis right LE  Plan: 1. Toenails 1-5 b/l were debrided in length and girth without iatrogenic laceration. 2. Corn(s) left 3rd digit pared utilizing sterile scalpel blade  without incident. 3. For her pain, a prescription was sent to [facility] for peripheral neuropathy cream which consists of: Bupivacaine 1%, doxepin 3%, gabapentin 6%, pentoxifylline 3%, and Topimarate 1%. Apply 1-2 grams to feet 3-4 times daily as needed for foot pain. 4. Continue soft, supportive shoe gear daily. 5. Report any pedal injuries to medical professional. 6. Follow up 9 weeks. 7. Patient/POA to call should there be a question/concern in there interim.

## 2019-07-20 DIAGNOSIS — M25671 Stiffness of right ankle, not elsewhere classified: Secondary | ICD-10-CM | POA: Diagnosis not present

## 2019-07-20 DIAGNOSIS — M62571 Muscle wasting and atrophy, not elsewhere classified, right ankle and foot: Secondary | ICD-10-CM | POA: Diagnosis not present

## 2019-07-20 DIAGNOSIS — R269 Unspecified abnormalities of gait and mobility: Secondary | ICD-10-CM | POA: Diagnosis not present

## 2019-07-20 DIAGNOSIS — M62561 Muscle wasting and atrophy, not elsewhere classified, right lower leg: Secondary | ICD-10-CM | POA: Diagnosis not present

## 2019-07-20 DIAGNOSIS — M25471 Effusion, right ankle: Secondary | ICD-10-CM | POA: Diagnosis not present

## 2019-07-20 DIAGNOSIS — M25571 Pain in right ankle and joints of right foot: Secondary | ICD-10-CM | POA: Diagnosis not present

## 2019-07-20 DIAGNOSIS — M810 Age-related osteoporosis without current pathological fracture: Secondary | ICD-10-CM | POA: Diagnosis not present

## 2019-07-20 DIAGNOSIS — M7661 Achilles tendinitis, right leg: Secondary | ICD-10-CM | POA: Diagnosis not present

## 2019-07-23 ENCOUNTER — Encounter: Payer: Self-pay | Admitting: Neurology

## 2019-07-25 DIAGNOSIS — M25571 Pain in right ankle and joints of right foot: Secondary | ICD-10-CM | POA: Diagnosis not present

## 2019-07-25 DIAGNOSIS — M25471 Effusion, right ankle: Secondary | ICD-10-CM | POA: Diagnosis not present

## 2019-07-25 DIAGNOSIS — M7661 Achilles tendinitis, right leg: Secondary | ICD-10-CM | POA: Diagnosis not present

## 2019-07-25 DIAGNOSIS — M810 Age-related osteoporosis without current pathological fracture: Secondary | ICD-10-CM | POA: Diagnosis not present

## 2019-07-25 DIAGNOSIS — M62561 Muscle wasting and atrophy, not elsewhere classified, right lower leg: Secondary | ICD-10-CM | POA: Diagnosis not present

## 2019-07-25 DIAGNOSIS — M25671 Stiffness of right ankle, not elsewhere classified: Secondary | ICD-10-CM | POA: Diagnosis not present

## 2019-07-25 DIAGNOSIS — R269 Unspecified abnormalities of gait and mobility: Secondary | ICD-10-CM | POA: Diagnosis not present

## 2019-07-25 DIAGNOSIS — M62571 Muscle wasting and atrophy, not elsewhere classified, right ankle and foot: Secondary | ICD-10-CM | POA: Diagnosis not present

## 2019-07-30 ENCOUNTER — Telehealth: Payer: Self-pay

## 2019-07-30 ENCOUNTER — Other Ambulatory Visit: Payer: Self-pay

## 2019-07-30 MED ORDER — FOLIC ACID 1 MG PO TABS: 1.0000 mg | ORAL_TABLET | Freq: Every day | ORAL | 3 refills | Status: DC

## 2019-07-30 NOTE — Telephone Encounter (Signed)
Refer to [facility] for Neuropathy with skin changes.  Scheduled with Misty. Next available is Jan. 11th for Neuromuscular. Pt scheduled at 9am with Dr. Verlin Fester. She can call for cancellations. A new pt packet will be mailed to her.  Pt must bring photo id and insurance cards to the appt and arrive 15 min early.  Notes faxed to [phone].  Address is [place].  Pt made aware.

## 2019-07-31 ENCOUNTER — Telehealth: Payer: Self-pay | Admitting: Pulmonary Disease

## 2019-07-31 DIAGNOSIS — J471 Bronchiectasis with (acute) exacerbation: Secondary | ICD-10-CM | POA: Diagnosis not present

## 2019-07-31 NOTE — Telephone Encounter (Signed)
Call returned to patient, she states she usually gets her flu shot during the winter after christmas and she wanted to know when would be the best time for her to get a flu shot. I made her aware that VS will be back on Monday 09/20. She voiced that she can wait until then to hear his recommendations.  VS please advise when would be the best time for patient to get flu shot.

## 2019-08-01 NOTE — Telephone Encounter (Signed)
No need for booster flu vaccine in the winter.

## 2019-08-01 NOTE — Telephone Encounter (Signed)
Spoke with the pt and notified of response per Dr Halford Chessman

## 2019-08-01 NOTE — Telephone Encounter (Signed)
Spoke with the pt and notified of Dr Juanetta Gosling response  She is now asking if it's a good idea to get a booster flu vaccine later in the year  I explained that the flu vaccine is good for 6 months  She wants to know if she should get a second one later in the winter "just to be safe" Please advise thanks!

## 2019-08-01 NOTE — Telephone Encounter (Signed)
She should get her flu shot before the end of October.

## 2019-08-21 DIAGNOSIS — H43812 Vitreous degeneration, left eye: Secondary | ICD-10-CM | POA: Diagnosis not present

## 2019-08-21 DIAGNOSIS — H43811 Vitreous degeneration, right eye: Secondary | ICD-10-CM | POA: Diagnosis not present

## 2019-08-21 DIAGNOSIS — H35372 Puckering of macula, left eye: Secondary | ICD-10-CM | POA: Diagnosis not present

## 2019-08-30 DIAGNOSIS — Z1231 Encounter for screening mammogram for malignant neoplasm of breast: Secondary | ICD-10-CM | POA: Diagnosis not present

## 2019-08-30 DIAGNOSIS — Z803 Family history of malignant neoplasm of breast: Secondary | ICD-10-CM | POA: Diagnosis not present

## 2019-08-30 DIAGNOSIS — Z90710 Acquired absence of both cervix and uterus: Secondary | ICD-10-CM | POA: Diagnosis not present

## 2019-08-30 DIAGNOSIS — J471 Bronchiectasis with (acute) exacerbation: Secondary | ICD-10-CM | POA: Diagnosis not present

## 2019-08-30 DIAGNOSIS — R29890 Loss of height: Secondary | ICD-10-CM | POA: Diagnosis not present

## 2019-08-30 DIAGNOSIS — M8589 Other specified disorders of bone density and structure, multiple sites: Secondary | ICD-10-CM | POA: Diagnosis not present

## 2019-09-03 ENCOUNTER — Other Ambulatory Visit: Payer: Self-pay | Admitting: Gastroenterology

## 2019-09-04 ENCOUNTER — Telehealth: Payer: Self-pay

## 2019-09-04 DIAGNOSIS — L821 Other seborrheic keratosis: Secondary | ICD-10-CM | POA: Diagnosis not present

## 2019-09-04 DIAGNOSIS — Z85828 Personal history of other malignant neoplasm of skin: Secondary | ICD-10-CM | POA: Diagnosis not present

## 2019-09-04 DIAGNOSIS — L570 Actinic keratosis: Secondary | ICD-10-CM | POA: Diagnosis not present

## 2019-09-04 DIAGNOSIS — B078 Other viral warts: Secondary | ICD-10-CM | POA: Diagnosis not present

## 2019-09-04 DIAGNOSIS — D485 Neoplasm of uncertain behavior of skin: Secondary | ICD-10-CM | POA: Diagnosis not present

## 2019-09-04 NOTE — Telephone Encounter (Signed)
Prior Auth for liquid Carafate sent to CoverMyMeds.

## 2019-09-05 MED ORDER — SUCRALFATE 1 G PO TABS: 1.0000 g | ORAL_TABLET | Freq: Four times a day (QID) | ORAL | 3 refills | Status: DC | PRN

## 2019-09-05 NOTE — Telephone Encounter (Signed)
Prior Auth denied. Will change to tablets and instruct pt how to make a slurry. Script faxed to [facility].

## 2019-09-06 ENCOUNTER — Encounter: Payer: Self-pay | Admitting: Pulmonary Disease

## 2019-09-06 ENCOUNTER — Ambulatory Visit (INDEPENDENT_AMBULATORY_CARE_PROVIDER_SITE_OTHER)
Admission: RE | Admit: 2019-09-06 | Discharge: 2019-09-06 | Disposition: A | Discharge status: Home / Self Care | Payer: Medicare HMO | Source: Ambulatory Visit | Attending: Pulmonary Disease | Admitting: Pulmonary Disease

## 2019-09-06 ENCOUNTER — Other Ambulatory Visit: Payer: Self-pay

## 2019-09-06 ENCOUNTER — Ambulatory Visit: Payer: Medicare HMO | Admitting: Pulmonary Disease

## 2019-09-06 VITALS — BP 124/70 | HR 67 | Temp 98.1°F | Ht 64.0 in | Wt 86.2 lb

## 2019-09-06 DIAGNOSIS — J479 Bronchiectasis, uncomplicated: Secondary | ICD-10-CM

## 2019-09-06 DIAGNOSIS — R0789 Other chest pain: Secondary | ICD-10-CM

## 2019-09-06 DIAGNOSIS — R0609 Other forms of dyspnea: Secondary | ICD-10-CM

## 2019-09-06 DIAGNOSIS — R0600 Dyspnea, unspecified: Secondary | ICD-10-CM | POA: Diagnosis not present

## 2019-09-06 DIAGNOSIS — R0602 Shortness of breath: Secondary | ICD-10-CM | POA: Diagnosis not present

## 2019-09-06 NOTE — Progress Notes (Signed)
[facility]  Chief Complaint  Patient presents with  . Centrilobular Emphysema    Throat hurts, feels raw and hurts to breathe for over 6 months, almost a year. Feeling is getting heavier.    Constitutional:  BP 124/70 (BP Location: Left Arm, Patient Position: Sitting, Cuff Size: Small)   Pulse 67   Temp 98.1 F (36.7 C)   Ht 5\' 4"  (1.626 m)   Wt 86 lb 3.2 oz (39.1 kg)   SpO2 98% Comment: on room air  BMI 14.80 kg/m   Past Medical History:  OA, Esophageal dysmotility, Nutcracker esophagus, Gastroparesis, GERD, Iron deficiency anemia, Neuropathy, Raynaud's  Brief Summary:  [name] is a 78 y.o. female former smoker with bronchiectasis, recurrent aspiration pneumonia with history of reflux, emphysema and history of cryptogenic organizing pneumonia.  She has noticed progressive chest discomfort when taking deep breaths.  Has this across chest and around her back.  She will feel raw in her throat also.  Has cough with yellow sputum.  Appetite down and has lost some weight.  No having fever, hemoptysis, or sweats.  She rides stationary bike in the morning, and this helps her breathing.  She hasn't been using mucinex or nebulized hypertonic saline >> these make her feel too revved up.  She has been using dexilant and carafate.  She has trouble swallowing pills and has been using liquid carafate, but there might be issues with insurance coverage for liquid carafate.  Physical Exam:   Appearance - well kempt, thin  ENMT - no sinus tenderness, no nasal discharge, no oral exudate  Neck - no masses, trachea midline, no thyromegaly, no elevation in JVP  Respiratory - normal appearance of chest wall, normal respiratory effort w/o accessory muscle use, no dullness on percussion, scattered rhonchi, no wheezing or rales  CV - s1s2 regular rate and rhythm, no murmurs, no peripheral edema, radial pulses symmetric  GI - soft, non tender  Lymph - no  adenopathy noted in neck and axillary areas  MSK - normal gait  Ext - no cyanosis, clubbing, or joint inflammation noted  Skin - no rashes, lesions, or ulcers  Neuro - normal strength, oriented x 3  Psych - normal mood and affect  Discussion:  She has bronchiectasis with recurrent pneumonias in setting of gastroesophageal reflux.  She has recurrent episodes of respiratory infection with increased sputum production.  This contributes to intermittent episodes of shortness of breath and fatigue.  She has progression of dyspnea and now having chest discomfort.  Assessment/Plan:   Bronchiectasis, centrilobular emphysema. - will arrange for chest xray today and then decide if she needs repeat CT chest - defer further therapeutics until CXR reviewed - she has hypertonic saline, albuterol, and mucinex but is reluctant to use these due to side effects  GERD with dysphagia. - f/u with Dr. Havery Moros with GI   Patient Instructions  Chest xray today  Follow up in 6 weeks   A total of  26 minutes were spent face to face with the patient and more than half of that time involved counseling or coordination of care.  Chesley Mires, MD [facility] Pager: [phone] 09/06/2019, 12:42 PM  Flow Sheet     Pulmonary tests:  PFT 12/31/09 >> FEV1 1.84(84%), FEV1% 73, TLC 4.94(98%), DLCO 77%, +BD. PFT 06/21/16 >> FEV1 1.22 (55%), FEV1% 76, TLC 5.29 (104%), DLCO 43% PFT 12/21/16 >> FEV1 1.49 (69%), FEV1% 78, TLC 5.42 (107%), RV 158%, DLCO  51%  Chest imaging:  CT chest 08/17/10 >> mild biapical scarring, basilar peribronchovascular nodularity and mild bronchiectasis HRCT chest 11/28/17 >> mild centrilobular emphysema, peribronchovascular nodularity, mild BTX, LLL consolidation CT chest 03/02/18 >> mild emphysema, areas of BTX, several nodular areas up to 1.4 cm CT chest 08/25/18 >> centrilobular emphysema, BTX with wall thickening and tree in bud RML/lingula/bilateral lower  lobes, unchanged opacity LLL, new patchy opacities LUL  (reviewed by me)  Cardiac tests:  Echo9/01/17 >> EF 60 to 65%, grade 2 DD, mild MR, small PFO, PAS 27 mmHg   Medications:   Allergies as of 09/06/2019      Reactions   Cortisone    unsure   Prednisone    Sulfasalazine Other (See Comments)   Unsure of reaction   Sulfonamide Derivatives    Unsure of reaction      Medication List       Accurate as of September 06, 2019 12:42 PM. If you have any questions, ask your nurse or doctor.        benzonatate 200 MG capsule Commonly known as: TESSALON Take 1 capsule (200 mg total) by mouth 3 (three) times daily as needed for cough. What changed: when to take this   Dexilant 60 MG capsule Generic drug: dexlansoprazole Take 1 capsule (60 mg total) by mouth daily.   fexofenadine 180 MG tablet Commonly known as: ALLEGRA Take 180 mg by mouth daily.   Flutter Devi Use as directed.   folic acid 1 MG tablet Commonly known as: FOLVITE Take 1 tablet (1 mg total) by mouth daily.   gabapentin 300 MG/6ML solution Commonly known as: NEURONTIN Take 2 mLs by mouth at bedtime.   NONFORMULARY OR COMPOUNDED ITEM Peripheral Neuropathy Cream: Bupivacaine 1%, Doxepin 3%, Gabapentin 6%, Pentoxifylline 3%, Topiramate 1% Order faxed to [place]   sodium chloride HYPERTONIC 3 % nebulizer solution Take by nebulization as needed for other.   sucralfate 1 g tablet Commonly known as: Carafate Take 1 tablet (1 g total) by mouth every 6 (six) hours as needed. Slowly dissolve 1 tablet in 1 Tablespoon of distilled water before ingesting.   triamcinolone 0.025 % cream Commonly known as: KENALOG   TURMERIC PO Take 1 tablet by mouth as needed.   TYLENOL EXTRA STRENGTH PO Take 350 mg by mouth as needed.   VITAMIN B1 PO Take 1 tablet by mouth as needed.       Past Surgical History:  She  has a past surgical history that includes Cholecystectomy (2005); Breast lumpectomy;  Total abdominal hysterectomy; Lung biopsy; Cataract extraction; Colonoscopy; Upper gastrointestinal endoscopy; Esophageal manometry (N/A, 10/06/2015); 24 hour ph study (N/A, 10/13/2015); and Esophageal manometry (N/A, 03/10/2018).  Family History:  Her family history includes Cancer in her maternal grandmother; Heart disease in her father and paternal grandfather; Leukemia in her paternal grandmother; Lung cancer in her paternal uncle; Lymphoma in her mother.  Social History:  She  reports that she quit smoking about 50 years ago. Her smoking use included cigarettes. She has never used smokeless tobacco. She reports that she does not drink alcohol or use drugs.

## 2019-09-06 NOTE — Telephone Encounter (Signed)
Letter written and faxed to [facility].

## 2019-09-06 NOTE — Patient Instructions (Signed)
Chest xray today  Follow up in 6 weeks

## 2019-09-06 NOTE — Telephone Encounter (Signed)
Her pharmacy wants [place] to write a letter so she can get the pills that were ordered.

## 2019-09-11 ENCOUNTER — Telehealth: Payer: Self-pay | Admitting: Pulmonary Disease

## 2019-09-11 DIAGNOSIS — J479 Bronchiectasis, uncomplicated: Secondary | ICD-10-CM

## 2019-09-11 DIAGNOSIS — R0609 Other forms of dyspnea: Secondary | ICD-10-CM

## 2019-09-11 DIAGNOSIS — R0600 Dyspnea, unspecified: Secondary | ICD-10-CM

## 2019-09-11 NOTE — Telephone Encounter (Signed)
CHEST - 2 VIEW  COMPARISON:  Chest radiograph October 10, 2017; chest CT August 25, 2018  FINDINGS: Lungs are mildly hyperexpanded. There are areas of fibrosis and bronchiectatic change in the lung bases; bronchiectasis is better delineated on CT. There is no frank edema or consolidation. There is scarring in the medial right mid lung region.  The heart size and pulmonary vascularity within normal limits. No adenopathy. Surgical clips are noted in the right chest region with evidence of previous mastectomy. No evident bone lesions.  IMPRESSION: There is a degree of underlying fibrotic type change and bronchiectasis. Lungs mildly hyperexpanded. No edema or consolidation.  Cardiac silhouette within normal limits. No adenopathy. Status post right mastectomy.   Electronically Signed   By: Lowella Grip III M.D.   On: 09/06/2019 17:22     Please let her know her CXR showed expected changes from history of bronchiectasis.  No evidence for pneumonia.

## 2019-09-13 NOTE — Telephone Encounter (Signed)
Called the patient and advised her of results.  Patient wanted to know if based on the xray results if she still needs to have the chest CT?

## 2019-09-17 NOTE — Telephone Encounter (Signed)
Spoke with pt, agreeable to CT.  CT has been ordered as requested.  Nothing further needed at this time- will close encounter.

## 2019-09-17 NOTE — Telephone Encounter (Signed)
Pt is returning call - CB# [phone]

## 2019-09-17 NOTE — Telephone Encounter (Signed)
She needs CT chest to further assess her dyspnea since no clear explanation from CXR to determine why her symptoms have progressed.  Please arrange for High resolution CT chest to assess dyspnea on exertion and bronchiectasis.

## 2019-09-17 NOTE — Telephone Encounter (Signed)
Unable to reach patient on home #. Had to [facility] on patient cell phone.

## 2019-09-21 ENCOUNTER — Ambulatory Visit (INDEPENDENT_AMBULATORY_CARE_PROVIDER_SITE_OTHER)
Admission: RE | Admit: 2019-09-21 | Discharge: 2019-09-21 | Disposition: A | Discharge status: Home / Self Care | Payer: Medicare HMO | Source: Ambulatory Visit | Attending: Pulmonary Disease | Admitting: Pulmonary Disease

## 2019-09-21 ENCOUNTER — Inpatient Hospital Stay: Admission: RE | Admit: 2019-09-21 | Payer: Medicare HMO | Source: Ambulatory Visit

## 2019-09-21 ENCOUNTER — Other Ambulatory Visit: Payer: Self-pay

## 2019-09-21 DIAGNOSIS — R0600 Dyspnea, unspecified: Secondary | ICD-10-CM | POA: Diagnosis not present

## 2019-09-21 DIAGNOSIS — J479 Bronchiectasis, uncomplicated: Secondary | ICD-10-CM

## 2019-09-21 DIAGNOSIS — R0602 Shortness of breath: Secondary | ICD-10-CM | POA: Diagnosis not present

## 2019-09-21 DIAGNOSIS — R0609 Other forms of dyspnea: Secondary | ICD-10-CM

## 2019-09-24 ENCOUNTER — Other Ambulatory Visit: Payer: Self-pay

## 2019-09-24 ENCOUNTER — Encounter

## 2019-09-24 ENCOUNTER — Ambulatory Visit (INDEPENDENT_AMBULATORY_CARE_PROVIDER_SITE_OTHER): Payer: Medicare HMO | Admitting: Podiatry

## 2019-09-24 ENCOUNTER — Encounter: Payer: Self-pay | Admitting: Podiatry

## 2019-09-24 DIAGNOSIS — L84 Corns and callosities: Secondary | ICD-10-CM | POA: Diagnosis not present

## 2019-09-24 DIAGNOSIS — M79675 Pain in left toe(s): Secondary | ICD-10-CM

## 2019-09-24 DIAGNOSIS — B351 Tinea unguium: Secondary | ICD-10-CM

## 2019-09-24 DIAGNOSIS — M79674 Pain in right toe(s): Secondary | ICD-10-CM

## 2019-09-24 NOTE — Patient Instructions (Signed)

## 2019-09-25 ENCOUNTER — Telehealth: Payer: Self-pay | Admitting: Pulmonary Disease

## 2019-09-25 DIAGNOSIS — I2584 Coronary atherosclerosis due to calcified coronary lesion: Secondary | ICD-10-CM

## 2019-09-25 DIAGNOSIS — R0609 Other forms of dyspnea: Secondary | ICD-10-CM

## 2019-09-25 DIAGNOSIS — R0600 Dyspnea, unspecified: Secondary | ICD-10-CM

## 2019-09-25 DIAGNOSIS — I2510 Atherosclerotic heart disease of native coronary artery without angina pectoris: Secondary | ICD-10-CM

## 2019-09-25 NOTE — Telephone Encounter (Signed)
HRCT chest 09/21/19 >> apical scarring, diffuse BTX with peribronchovascular nodularity, areas of mucoid impaction, coronary calcification   Left message for pt to call back.  Detailed that CT shows BTX with increase mucus >> need more aggressive pulmonary hygiene.  Also has coronary calcifications >> might need referral to cardiology.

## 2019-09-25 NOTE — Telephone Encounter (Signed)
I called and spoke with the pt and notified of the results She verbalized understanding  States she is using her mucinex but not using her flutter valve bc is makes her feel dizzy  She wants to know what the next step is and if she needs to do anything different between now and her next ov in Dec 2020  Please advise thanks

## 2019-09-25 NOTE — Telephone Encounter (Signed)
Pt returning call.  [phone].

## 2019-09-25 NOTE — Progress Notes (Addendum)
Subjective: "That toe hurts again." [name] presents to clinic with cc of painful mycotic toenails and chronic corn distal tip of left 3rd digit, both which are aggravated when weightbearing with and without shoe gear.  This pain limits her daily activities. Pain symptoms resolve with periodic professional debridement.  She voices no new pedal concerns on today.  She states she needs another silicone toe cap for daily protection of the digit.  Current Outpatient Medications on File Prior to Visit  Medication Sig Dispense Refill  . Acetaminophen (TYLENOL EXTRA STRENGTH PO) Take 350 mg by mouth as needed.     . benzonatate (TESSALON) 200 MG capsule Take 1 capsule (200 mg total) by mouth 3 (three) times daily as needed for cough. (Patient taking differently: Take 200 mg by mouth as needed for cough. ) 30 capsule 2  . dexlansoprazole (DEXILANT) 60 MG capsule Take 1 capsule (60 mg total) by mouth daily. 90 capsule 1  . fexofenadine (ALLEGRA) 180 MG tablet Take 180 mg by mouth daily.    . folic acid (FOLVITE) 1 MG tablet Take 1 tablet (1 mg total) by mouth daily. 30 tablet 3  . Gabapentin 300 MG/6ML SOLN Take 2 mLs by mouth at bedtime. 470 mL 11  . NONFORMULARY OR COMPOUNDED ITEM Peripheral Neuropathy Cream: Bupivacaine 1%, Doxepin 3%, Gabapentin 6%, Pentoxifylline 3%, Topiramate 1% Order faxed to [place] 1 each 3  . Respiratory Therapy Supplies (FLUTTER) DEVI Use as directed. 1 each 0  . sodium chloride HYPERTONIC 3 % nebulizer solution Take by nebulization as needed for other. 750 mL 12  . sucralfate (CARAFATE) 1 g tablet Take 1 tablet (1 g total) by mouth every 6 (six) hours as needed. Slowly dissolve 1 tablet in 1 Tablespoon of distilled water before ingesting. 60 tablet 3  . Thiamine Mononitrate (VITAMIN B1 PO) Take 1 tablet by mouth as needed.     . triamcinolone (KENALOG) 0.025 % cream     . TURMERIC PO Take 1 tablet by mouth as needed.     No current facility-administered  medications on file prior to visit.      Allergies  Allergen Reactions  . Cortisone     unsure  . Prednisone   . Sulfasalazine Other (See Comments)    Unsure of reaction  . Sulfonamide Derivatives     Unsure of reaction     Objective: There were no vitals filed for this visit.  Physical Examination:  Vascular  Examination: Capillary refill time immediate x 10 digits.  Palpable DP/PT pulses b/l.  Digital hair remains sparse b/l.  No edema noted b/l.  Skin temperature gradient WNL b/l.  Dermatological Examination: Pedal skin thin, shiny and atrophic b/l.  No open wounds b/l.  No interdigital macerations noted b/l.  Elongated, thick, discolored brittle toenails with subungual debris and pain on dorsal palpation of nailbeds 1-5 b/l.  Hyperkeratotic lesion distal tip of left 3rd toe with tenderness to palpation. No edema, no erythema, no drainage, no flocculence.   Musculoskeletal Examination: Muscle strength 5/5 to all muscle groups b/l.  No pain, crepitus or joint discomfort with active/passive ROM.  Neurological Examination: Sensation intact 5/5 b/l with 10 gram monofilament.  Assessment: 1. Mycotic nail infection with pain 1-5 b/l 2. Corn distal tip left 3rd toe  Plan: 1. Toenails 1-5 b/l were debrided in length and girth without iatrogenic laceration. Corn left 3rd toe pared utilizing sterile scalpel blade without incident. Dispensed silicone toe cap for daily protection. Continue soft,  supportive shoe gear daily. Report any pedal injuries to medical professional. Follow up 9 weeks. Patient/POA to call should there be a question/concern in there interim.

## 2019-09-26 NOTE — Telephone Encounter (Signed)
Spoke with pt over the phone.  Reviewed CT chest findings.  Advised her to try being more aggressive with pulmonary hygiene measures.  Since her symptoms of dyspnea have progressed w/o significant progression of bronchiectasis and she has coronary calcification, the concern is whether she could have coronary artery disease contributing to her dyspnea.  Her father and brother both had CAD.  Will arrange for referral to cardiology to further assess.

## 2019-09-28 ENCOUNTER — Other Ambulatory Visit: Payer: Self-pay

## 2019-09-28 ENCOUNTER — Encounter: Payer: Self-pay | Admitting: Cardiovascular Disease

## 2019-09-28 ENCOUNTER — Ambulatory Visit (INDEPENDENT_AMBULATORY_CARE_PROVIDER_SITE_OTHER): Payer: Medicare HMO | Admitting: Cardiovascular Disease

## 2019-09-28 VITALS — BP 142/84 | HR 63 | Ht 64.0 in | Wt 83.8 lb

## 2019-09-28 DIAGNOSIS — J479 Bronchiectasis, uncomplicated: Secondary | ICD-10-CM | POA: Diagnosis not present

## 2019-09-28 DIAGNOSIS — E7800 Pure hypercholesterolemia, unspecified: Secondary | ICD-10-CM | POA: Diagnosis not present

## 2019-09-28 DIAGNOSIS — I2510 Atherosclerotic heart disease of native coronary artery without angina pectoris: Secondary | ICD-10-CM

## 2019-09-28 DIAGNOSIS — J690 Pneumonitis due to inhalation of food and vomit: Secondary | ICD-10-CM | POA: Diagnosis not present

## 2019-09-28 DIAGNOSIS — I700 Atherosclerosis of aorta: Secondary | ICD-10-CM

## 2019-09-28 DIAGNOSIS — R0609 Other forms of dyspnea: Secondary | ICD-10-CM

## 2019-09-28 DIAGNOSIS — Q211 Atrial septal defect: Secondary | ICD-10-CM | POA: Diagnosis not present

## 2019-09-28 DIAGNOSIS — R0600 Dyspnea, unspecified: Secondary | ICD-10-CM

## 2019-09-28 DIAGNOSIS — I2584 Coronary atherosclerosis due to calcified coronary lesion: Secondary | ICD-10-CM | POA: Diagnosis not present

## 2019-09-28 DIAGNOSIS — Q2112 Patent foramen ovale: Secondary | ICD-10-CM

## 2019-09-28 MED ORDER — METOPROLOL TARTRATE 50 MG PO TABS: 50.0000 mg | ORAL_TABLET | Freq: Once | ORAL | 0 refills | Status: DC

## 2019-09-28 NOTE — Progress Notes (Signed)
Cardiology Office Note    Date:  09/30/2019   ID:  [name], [name] [birth year], MRN [id number]  PCP:  [name], MD  Cardiologist:  Shelva Majestic, MD   Cardiology evaluation, referred through the courtesy of Dr. Chesley Mires  History of Present Illness:  [name] is a 78 y.o. female who was followed by Dr. Halford Chessman for bronchiectasis and has developed progressive symptoms of dyspnea.  A recent CT scan suggested coronary calcification and she is now referred for cardiology evaluation.  [name] has a history of remote tobacco use and has been diagnosed with bronchiectasis.  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  She has a history of recurrent aspiration pneumonia in the setting of gastroesophageal reflux.  She has had recurrent episodes of respiratory infection with increased sputum production.  She has developed progressive dyspnea and recently had developed some symptoms of chest discomfort.  She recently underwent a high-resolution chest CT which showed progression of bronchiectasis and also demonstrated coronary calcification.  Because of concerns of potential ischemic etiology to her dyspnea she is now referred for cardiology evaluation.  In 2016, she was seen by Dorris Carnes, MD after experiencing an episode of left-sided chest pain radiating to her left arm.  She felt her pain was very atypical.  It was felt most likely to be noncardiac in etiology.  Of note, an echo Doppler study at that time showed an EF of 55 to 60% and was without wall motion abnormality.  There was a suggestion of a possible PFO involving the atrial septum.  In 2017, follow-up echo Doppler study showed EF at 60 to 65% with grade 2 diastolic dysfunction, mild MR, and again there was noted a small patent foramen ovale with left-to-right flow by color Doppler.  There was mild TR.  Estimated PA  pressure was 27 mm.  She admits that she can ride a stationary bike slowly.  She does not exert herself with walking due to significant dyspnea.  She also has had issues with dependent rubor to her lower extremities with varicosities.  She has never been evaluated for lower extremity venous insufficiency.  She has had progressive issues with peripheral neuropathy and she tells me she will be undergoing an evaluation for this in [place].  Past Medical History:  Diagnosis Date   Allergy    Arthritis    BOOP (bronchiolitis obliterans with organizing pneumonia) ([place])    Bronchiectasis    Cataract    Esophageal dysmotility    Gastroparesis    GERD (gastroesophageal reflux disease)    severe   Iron deficiency anemia    Neuropathy    Nutcracker esophagus    Pneumonia    Raynaud's disease    Right lower lobe pneumonia December 2013    Past Surgical History:  Procedure Laterality Date   72 HOUR [place] STUDY N/A 10/13/2015   Procedure: 24 HOUR [place] STUDY;  Surgeon: Gatha Mayer, MD;  Location: [facility];  Service: Endoscopy;  Laterality: N/A;   BREAST LUMPECTOMY     left, x 2   CATARACT EXTRACTION     both eyes   CHOLECYSTECTOMY  2005   COLONOSCOPY     ESOPHAGEAL MANOMETRY N/A 10/06/2015   Procedure: ESOPHAGEAL MANOMETRY (EM);  Surgeon: Manus Gunning, MD;  Location: [facility];  Service: Gastroenterology;  Laterality: N/A;   ESOPHAGEAL MANOMETRY  N/A 03/10/2018   Procedure: ESOPHAGEAL MANOMETRY (EM);  Surgeon: Mauri Pole, MD;  Location: [facility];  Service: Endoscopy;  Laterality: N/A;   LUNG BIOPSY     vats right 2003   TOTAL ABDOMINAL HYSTERECTOMY     UPPER GASTROINTESTINAL ENDOSCOPY      Current Medications: Outpatient Medications Prior to Visit  Medication Sig Dispense Refill   Acetaminophen (TYLENOL EXTRA STRENGTH PO) Take 350 mg by mouth as needed.      benzonatate (TESSALON) 200 MG capsule Take 1 capsule (200 mg total) by  mouth 3 (three) times daily as needed for cough. (Patient taking differently: Take 200 mg by mouth as needed for cough. ) 30 capsule 2   dexlansoprazole (DEXILANT) 60 MG capsule Take 1 capsule (60 mg total) by mouth daily. 90 capsule 1   fexofenadine (ALLEGRA) 180 MG tablet Take 180 mg by mouth daily.     folic acid (FOLVITE) 1 MG tablet Take 1 tablet (1 mg total) by mouth daily. 30 tablet 3   Gabapentin 300 MG/6ML SOLN Take 2 mLs by mouth at bedtime. 470 mL 11   NONFORMULARY OR COMPOUNDED ITEM Peripheral Neuropathy Cream: Bupivacaine 1%, Doxepin 3%, Gabapentin 6%, Pentoxifylline 3%, Topiramate 1% Order faxed to [facility] 1 each 3   Respiratory Therapy Supplies (FLUTTER) DEVI Use as directed. 1 each 0   sodium chloride HYPERTONIC 3 % nebulizer solution Take by nebulization as needed for other. 750 mL 12   sucralfate (CARAFATE) 1 g tablet Take 1 tablet (1 g total) by mouth every 6 (six) hours as needed. Slowly dissolve 1 tablet in 1 Tablespoon of distilled water before ingesting. 60 tablet 3   Thiamine Mononitrate (VITAMIN B1 PO) Take 1 tablet by mouth as needed.      triamcinolone (KENALOG) 0.025 % cream      TURMERIC PO Take 1 tablet by mouth as needed.     No facility-administered medications prior to visit.      Allergies:   Cortisone, Prednisone, Sulfasalazine, and Sulfonamide derivatives   Social History   Socioeconomic History   Marital status: Widowed    Spouse name: Not on file   Number of children: 2   Years of education: Not on file   Highest education level: Not on file  Occupational History   Occupation: retired Pharmacist, hospital    Comment: still teaching pre [employer] resource strain: Not on file   Food insecurity    Worry: Not on file    Inability: Not on file   Transportation needs    Medical: Not on file    Non-medical: Not on file  Tobacco Use   Smoking status: Former Smoker    Types: Cigarettes    Quit date: 11/15/1968      Years since quitting: 50.9   Smokeless tobacco: Never Used   Tobacco comment: 3 cigs a day  Substance and Sexual Activity   Alcohol use: No   Drug use: No   Sexual activity: Not Currently    Partners: Male  Lifestyle   Physical activity    Days per week: Not on file    Minutes per session: Not on file   Stress: Not on file  Relationships   Social connections    Talks on phone: Not on file    Gets together: Not on file    Attends religious service: Not on file    Active member of club or organization: Not on file  Attends meetings of clubs or organizations: Not on file    Relationship status: Not on file  Other Topics Concern   Not on file  Social History Narrative   Still working as a Print production planner at [employer].       Widowed      Lives alone    Socially she was born in [place].  She previously taught kindergarten.  She retired at age 89.  She rides a stationary bike.  Family History:  The patient's family history includes Cancer in her maternal grandmother; Heart disease in her father and paternal grandfather; Leukemia in her paternal grandmother; Lung cancer in her paternal uncle; Lymphoma in her mother.   Her father died at age 80 and had heart problems.  Mother died at age 21 with leukemia.  ROS General: Negative; No fevers, chills, or night sweats;  HEENT: Negative; No changes in vision or hearing, sinus congestion, difficulty swallowing Pulmonary: Negative; No cough, wheezing, shortness of breath, hemoptysis Cardiovascular: Progressive exertional dyspnea.  No chest tightness with bike riding on a stationary bike GI: GERD with recurrent aspirations GU: Negative; No dysuria, hematuria, or difficulty voiding Musculoskeletal: Negative; no myalgias, joint pain, or weakness Hematologic/Oncology: Negative; no easy bruising, bleeding Endocrine: Negative; no heat/cold intolerance; no diabetes Neuro: Negative; no changes in balance,  headaches Skin: Positive for dependent rubor Psychiatric: Negative; No behavioral problems, depression Sleep: Negative; No snoring, daytime sleepiness, hypersomnolence, bruxism, restless legs, hypnogognic hallucinations, no cataplexy Other comprehensive 14 point system review is negative.   PHYSICAL EXAM:   VS:  BP (!) 142/84    Pulse 63    Ht _0  (1.626 m)    Wt 83 lb 12.8 oz (38 kg)    SpO2 99%    BMI 14.38 kg/m     Repeat blood pressure by me 122/76  Wt Readings from Last 3 Encounters:  09/28/19 83 lb 12.8 oz (38 kg)  09/06/19 86 lb 3.2 oz (39.1 kg)  07/12/19 82 lb (37.2 kg)    General: Alert, oriented, no distress.  Skin: normal turgor, no rashes, warm and dry HEENT: Normocephalic, atraumatic. Pupils equal round and reactive to light; sclera anicteric; extraocular muscles intact;  Nose without nasal septal hypertrophy Mouth/Parynx benign; Mallinpatti scale 3 Neck: No JVD, no carotid bruits; normal carotid upstroke Lungs: clear to ausculatation and percussion; no wheezing or rales Chest wall: without tenderness to palpitation Heart: PMI not displaced, RRR, s1 s2 normal, 1/6 systolic murmur, no diastolic murmur, no rubs, gallops, thrills, or heaves Abdomen: soft, nontender; no hepatosplenomehaly, BS+; abdominal aorta nontender and not dilated by palpation. Back: no CVA tenderness Pulses 2+ Musculoskeletal: full range of motion, normal strength, no joint deformities Extremities: Significant dependent rubor with her feet appearing purplish with dependency with significant spider veins and varicosities.  When her legs were lifted up the dependent rubor resolved; no clubbing ; Homan's sign negative  Neurologic: grossly nonfocal; Cranial nerves grossly wnl Psychologic: Normal mood and affect   Studies/Labs Reviewed:   EKG:  EKG is ordered today.  ECG (independently read by me): Normal sinus rhythm at 63 bpm.  No ectopy.  Normal intervals.  No ST segment changes.  Recent  Labs: BMP Latest Ref Rng & Units 04/28/2017 06/22/2016 08/14/2010  Glucose 70 - 99 mg/dL 81 - 78  BUN 6 - 23 mg/dL _1 Creatinine 0.40 - 1.20 mg/dL 0.64 0.61 0.7  Sodium 135 - 145 mEq/L 138 - 139  Potassium 3.5 - 5.1 mEq/L 4.4 -  5.1  Chloride 96 - 112 mEq/L 104 - 100  CO2 19 - 32 mEq/L 30 - 32  Calcium 8.4 - 10.5 mg/dL 9.6 - 9.4     Hepatic Function Latest Ref Rng & Units 07/12/2019 04/28/2017 10/14/2009  Total Protein 6.1 - 8.1 g/dL 7.1 6.7 7.3  Albumin 3.5 - 5.2 g/dL - 4.1 3.7  AST 0 - 37 U/L - 29 39(H)  ALT 0 - 35 U/L - 28 49(H)  Alk Phosphatase 39 - 117 U/L - 65 86  Total Bilirubin 0.2 - 1.2 mg/dL - 0.3 0.6  Bilirubin, Direct 0.0 - 0.3 mg/dL - - 0.0    CBC Latest Ref Rng & Units 04/28/2017 02/02/2016 01/02/2016  WBC 4.0 - 10.5 K/uL 7.4 8.2 8.6  Hemoglobin 12.0 - 15.0 g/dL 11.6(L) 11.0(L) 11.4(L)  Hematocrit 36.0 - 46.0 % 36.4 34.3(L) 35.5(L)  Platelets 150.0 - 400.0 K/uL 392.0 433.0(H) 471.0(H)   Lab Results  Component Value Date   MCV 86.4 04/28/2017   MCV 83.7 02/02/2016   MCV 84.1 01/02/2016   Lab Results  Component Value Date   TSH 5.34 (H) 07/12/2019   No results found for: HGBA1C   BNP No results found for: BNP  ProBNP No results found for: PROBNP   Lipid Panel     Component Value Date/Time   CHOL 214 (H) 07/05/2016 0935   TRIG 113.0 07/05/2016 0935   HDL 85.40 07/05/2016 0935   CHOLHDL 3 07/05/2016 0935   VLDL 22.6 07/05/2016 0935   LDLCALC 106 (H) 07/05/2016 0935     RADIOLOGY: Dg Chest 2 View  Result Date: 09/06/2019 CLINICAL DATA:  Shortness of breath.  History of bronchiectasis EXAM: CHEST - 2 VIEW COMPARISON:  Chest radiograph October 10, 2017; chest CT August 25, 2018 FINDINGS: Lungs are mildly hyperexpanded. There are areas of fibrosis and bronchiectatic change in the lung bases; bronchiectasis is better delineated on CT. There is no frank edema or consolidation. There is scarring in the medial right mid lung region. The heart size and  pulmonary vascularity within normal limits. No adenopathy. Surgical clips are noted in the right chest region with evidence of previous mastectomy. No evident bone lesions. IMPRESSION: There is a degree of underlying fibrotic type change and bronchiectasis. Lungs mildly hyperexpanded. No edema or consolidation. Cardiac silhouette within normal limits. No adenopathy. Status post right mastectomy. Electronically Signed   By: Lowella Grip III M.D.   On: 09/06/2019 17:22   Ct Chest High Resolution  Result Date: 09/21/2019 CLINICAL DATA:  Chronic shortness of breath and cough. EXAM: CT CHEST WITHOUT CONTRAST TECHNIQUE: Multidetector CT imaging of the chest was performed following the standard protocol without intravenous contrast. High resolution imaging of the lungs, as well as inspiratory and expiratory imaging, was performed. COMPARISON:  08/25/2018 11/28/2017 and 09/14/2010. FINDINGS: Cardiovascular: Atherosclerotic calcification of the aorta and coronary arteries. Heart size normal. No pericardial effusion. Mediastinum/Nodes: No pathologically enlarged mediastinal or axillary lymph nodes. Hilar regions are difficult to definitively evaluate without IV contrast. Esophagus is grossly unremarkable. Lungs/Pleura: Biapical pleuroparenchymal scarring. Fairly diffuse bronchiectasis with peribronchial thickening and peribronchovascular nodularity as on the prior exam. Associated mucoid impaction. Negative for subpleural reticulation, traction bronchiectasis/bronchiolectasis, ground-glass, architectural distortion or honeycombing. Postoperative changes are seen along the right major fissure. Nodular subpleural consolidation in the peripheral left lower lobe (3/120) is unchanged from 11/28/2017 and most likely benign. No pleural fluid. Airway is otherwise unremarkable. Expiratory phase imaging was not performed in true expiration, limiting evaluation for air trapping.  Upper Abdomen: Visualized portions of the liver,  adrenal glands, left kidney, spleen, pancreas and stomach are grossly unremarkable. Musculoskeletal: Degenerative changes in the spine. No worrisome lytic or sclerotic lesions. IMPRESSION: 1. Pulmonary parenchymal pattern of bronchiectasis, peribronchial thickening, peribronchovascular nodularity and mucoid impaction, similar to the prior exam and indicative of mycobacterium avium complex. 2. No evidence of fibrotic interstitial lung disease. 3. Aortic atherosclerosis (ICD10-170.0). Coronary artery calcification. Electronically Signed   By: Lorin Picket M.D.   On: 09/21/2019 13:00     Additional studies/ records that were reviewed today include: I reviewed the records of Dr. Halford Chessman as well as the patient's prior echo Doppler study from 2017  ------------------------------------------------------------------- ECHO 06/04/2015 Study Conclusions  - Left ventricle: The cavity size was normal. Systolic function was   normal. The estimated ejection fraction was in the range of 55%   to 60%. Wall motion was normal; there were no regional wall   motion abnormalities. - Atrial septum: Probable PFO.  ------------------------------------------------------------------------ ECHO 07/16/2016 Study Conclusions  - Left ventricle: The cavity size was normal. Wall thickness was   normal. Systolic function was normal. The estimated ejection   fraction was in the range of 60% to 65%. Wall motion was normal;   there were no regional wall motion abnormalities. Doppler   parameters are consistent with pseudonormal left ventricular   relaxation (grade 2 diastolic dysfunction). The E/e&' ratio is   between 8-15, suggesting indeterminate LV filling pressure. - Aortic valve: Sclerosis without stenosis. There was no   regurgitation. - Mitral valve: Mildly thickened leaflets . There was mild   regurgitation. - Left atrium: The atrium was at the upper limits of normal in   size. - Atrial septum: There was a small  patent foramen ovale with left   to right flow by color doppler. - Tricuspid valve: There was mild regurgitation. - Pulmonary arteries: PA peak pressure: 27 mm Hg (S). - Inferior vena cava: The vessel was normal in size. The   respirophasic diameter changes were in the normal range (>= 50%),   consistent with normal central venous pressure.  Impressions:  - Compared to a prior study in 2016, the LVEF is higher at 60-65%.   There is a small PFO identified by color doppler.   ASSESSMENT:    1. Coronary atherosclerosis due to calcified coronary lesion   2. DOE (dyspnea on exertion)   3. PFO (patent foramen ovale)   4. Aortic atherosclerosis ([place])   5. BRONCHIECTASIS   6. Recurrent aspiration pneumonia ([place])   7. Pure hypercholesterolemia     PLAN:  Ms. Hue Steveson is a 78 year old female who has a history of progressive bronchiectasis with recurrent aspiration and pneumonias, esophageal dysmotility, peripheral neuropathy, who was recently found to have aortic atherosclerosis and coronary calcification on a high-resolution chest CT.  She has previously undergone prior echocardiographic evaluation which had shown normal systolic motion and a concern for small PFO.  With her progressive exertional dyspnea I am recommending a 3-year follow-up echo Doppler study for further evaluation of both systolic and diastolic function, valvular architecture, pulmonary pressures and to reassess her PFO.  She has had significant reflux symptomatology.  On exam she also has significant dependent rubor to her feet bilaterally which resolves with supine position or leg elevation.  I did discuss the possibility of undergoing a lower extremity Doppler study to assess for venous insufficiency but she tells me she will be going to [facility] for evaluation of her peripheral neuropathy and  wishes to defer this Doppler study presently.  With her evidence for coronary atherosclerosis and coronary calcification  noted on her chest CT I will schedule her for coronary CT angio to make certain she does not have significant coronary plaque burden which may contribute to potential ischemia mediated dyspnea.  She had undergone laboratory by Dr. Ellouise Newer in September 2020.  TSH was minimally increased at 5.34.  ANA was positive..  She does not appear to have had recent lipid studies and if not these will need to be done particularly with evidence for coronary calcification.  Lipid studies in 2017 revealed a total cholesterol of 214, HDL 85 LDL 106 and triglycerides 113.  I will see her in 2 to 3 months in follow-up of the above studies and further recommendations were made at that time.   Medication Adjustments/Labs and Tests Ordered: Current medicines are reviewed at length with the patient today.  Concerns regarding medicines are outlined above.  Medication changes, Labs and Tests ordered today are listed in the Patient Instructions below. Patient Instructions  Medication Instructions:  Your physician recommends that you continue on your current medications as directed. Please refer to the Current Medication list given to you today.  *If you need a refill on your cardiac medications before your next appointment, please call your pharmacy*  Lab Work: Your physician recommends that you return for lab work WITHIN 1 WEEK:   [place]     If you have labs (blood work) drawn today and your tests are completely normal, you will receive your results only by:  MyChart Message (if you have MyChart) OR  A paper copy in the mail If you have any lab test that is abnormal or we need to change your treatment, we will call you to review the results.  Testing/Procedures: Your physician has requested that you have an echocardiogram. Echocardiography is a painless test that uses sound waves to create images of your heart.  It provides your doctor with information about the size and shape of your heart and how well your hearts chambers and valves are working. This procedure takes approximately one hour. There are no restrictions for this procedure. LOCATION: [facility]: [place], [place], [place] [address]  TO BE SCHEDULED  Your physician has requested that you have cardiac CT. Cardiac computed tomography (CT) is a painless test that uses an x-ray machine to take clear, detailed pictures of your heart. For further information please visit [email or web address]tn. Please follow instruction sheet as given.  TO BE SCHEDULED  Follow-Up: At [facility], you and your health needs are our priority.  As part of our continuing mission to provide you with exceptional heart care, we have created designated Provider Care Teams.  These Care Teams include your primary Cardiologist (physician) and Advanced Practice Providers (APPs -  Physician Assistants and Nurse Practitioners) who all work together to provide you with the care you need, when you need it.  Your next appointment:   2-3 MONTHS  The format for your next appointment:   In Person  Provider:   You may see DR. Chan Rosasco or one of the following Advanced Practice Providers on your designated Care Team:    Almyra Deforest, PA-C  Fabian Sharp, [place] or   Roby Lofts, [place]   Other Instructions    Your  cardiac CT will be scheduled at one of the below locations:   [facility] [address] [place], [place] [address] (336) [place] [address] [place], [place] [address] [phone]  If scheduled at [facility], please arrive at the [facility] main entrance of [facility] 30-45 minutes prior to test start time. Proceed to the [facility] Radiology Department (first floor) to check-in and test prep.  If scheduled at  [facility], please arrive 15 mins early for check-in and test prep.  Please follow these instructions carefully (unless otherwise directed):   On the Night Before the Test:  Be sure to Drink plenty of water.  Do not consume any caffeinated/decaffeinated beverages or chocolate 12 hours prior to your test.  Do not take any antihistamines 12 hours prior to your test.  On the Day of the Test:  Drink plenty of water. Do not drink any water within one hour of the test.  Do not eat any food 4 hours prior to the test.  You may take your regular medications prior to the test.   Take metoprolol (Lopressor) 50 MG two hours prior to test.  FEMALES- please wear underwire-free bra if available       After the Test:  Drink plenty of water.  After receiving IV contrast, you may experience a mild flushed feeling. This is normal.  On occasion, you may experience a mild rash up to 24 hours after the test. This is not dangerous. If this occurs, you can take Benadryl 25 mg and increase your fluid intake.  If you experience trouble breathing, this can be serious. If it is severe call 911 IMMEDIATELY. If it is mild, please call our office.   Once we have confirmed authorization from your insurance company, we will call you to set up a date and time for your test.   For non-scheduling related questions, please contact the cardiac imaging nurse navigator should you have any questions/concerns: Marchia Bond, RN Navigator Cardiac Imaging [facility] [phone] Office        Signed, Shelva Majestic, MD  09/30/2019 5:28 PM    [place] [address] [place], [place], [place]  [address] Phone: [phone]

## 2019-09-28 NOTE — Patient Instructions (Addendum)
Medication Instructions:  Your physician recommends that you continue on your current medications as directed. Please refer to the Current Medication list given to you today.  *If you need a refill on your cardiac medications before your next appointment, please call your pharmacy*  Lab Work: Your physician recommends that you return for lab work WITHIN 1 WEEK:   [place]     If you have labs (blood work) drawn today and your tests are completely normal, you will receive your results only by: [name] MyChart Message (if you have MyChart) OR . A paper copy in the mail If you have any lab test that is abnormal or we need to change your treatment, we will call you to review the results.  Testing/Procedures: Your physician has requested that you have an echocardiogram. Echocardiography is a painless test that uses sound waves to create images of your heart. It provides your doctor with information about the size and shape of your heart and how well your heart's chambers and valves are working. This procedure takes approximately one hour. There are no restrictions for this procedure. LOCATION: [facility]: [address], [place], [place] [address]  TO BE SCHEDULED  Your physician has requested that you have cardiac CT. Cardiac computed tomography (CT) is a painless test that uses an x-ray machine to take clear, detailed pictures of your heart. For further information please visit [employer]. Please follow instruction sheet as given.  TO BE SCHEDULED  Follow-Up: At [facility], you and your health needs are our priority.  As part of our continuing mission to provide you with exceptional heart care, we have created designated Provider Care Teams.  These Care Teams include your primary Cardiologist (physician) and Advanced Practice Providers (APPs -   Physician Assistants and Nurse Practitioners) who all work together to provide you with the care you need, when you need it.  Your next appointment:   2-3 MONTHS  The format for your next appointment:   In Person  Provider:   You may see DR. KELLY or one of the following Advanced Practice Providers on your designated Care Team:    Almyra Deforest, PA-C  Fabian Sharp, [place] or   Roby Lofts, [place]   Other Instructions    Your cardiac CT will be scheduled at one of the below locations:   [facility] [address] [place], [place] [address] [phone]  [facility] [address] [place],  [address] [phone]  If scheduled at [facility], please arrive at the [facility] main entrance of [facility] 30-45 minutes prior to test start time. Proceed to the [facility] Radiology Department (first floor) to check-in and test prep.  If scheduled at [facility], please arrive 15 mins early for check-in and test prep.  Please follow these instructions carefully (unless otherwise directed):   On the Night Before the Test: . Be sure to Drink plenty of water. . Do not consume any caffeinated/decaffeinated beverages or chocolate 12 hours prior to your test. . Do not take any antihistamines 12 hours prior to your test.  On the Day of the Test: . Drink plenty of water. Do not drink any water within one hour of the test. . Do not eat any food 4  hours prior to the test. . You may take your regular medications prior to the test.  . Take metoprolol (Lopressor) 50 MG two hours prior to test. . FEMALES- please wear underwire-free bra if available       After the Test: . Drink plenty of water. . After receiving IV contrast, you may experience a mild flushed feeling. This is normal. . On occasion, you may experience a mild rash up to 24 hours after the test. This is not  dangerous. If this occurs, you can take Benadryl 25 mg and increase your fluid intake. . If you experience trouble breathing, this can be serious. If it is severe call 911 IMMEDIATELY. If it is mild, please call our office.   Once we have confirmed authorization from your insurance company, we will call you to set up a date and time for your test.   For non-scheduling related questions, please contact the cardiac imaging nurse navigator should you have any questions/concerns: Marchia Bond, RN Navigator Cardiac Imaging [facility] [phone] Office

## 2019-09-30 ENCOUNTER — Encounter: Payer: Self-pay | Admitting: Cardiovascular Disease

## 2019-09-30 DIAGNOSIS — J471 Bronchiectasis with (acute) exacerbation: Secondary | ICD-10-CM | POA: Diagnosis not present

## 2019-10-01 DIAGNOSIS — I2510 Atherosclerotic heart disease of native coronary artery without angina pectoris: Secondary | ICD-10-CM | POA: Diagnosis not present

## 2019-10-01 DIAGNOSIS — Q211 Atrial septal defect: Secondary | ICD-10-CM | POA: Diagnosis not present

## 2019-10-01 DIAGNOSIS — I700 Atherosclerosis of aorta: Secondary | ICD-10-CM | POA: Diagnosis not present

## 2019-10-01 DIAGNOSIS — R0600 Dyspnea, unspecified: Secondary | ICD-10-CM | POA: Diagnosis not present

## 2019-10-01 DIAGNOSIS — J479 Bronchiectasis, uncomplicated: Secondary | ICD-10-CM | POA: Diagnosis not present

## 2019-10-01 DIAGNOSIS — I2584 Coronary atherosclerosis due to calcified coronary lesion: Secondary | ICD-10-CM | POA: Diagnosis not present

## 2019-10-01 LAB — COMPREHENSIVE METABOLIC PANEL
ALT: 50 IU/L — ABNORMAL HIGH (ref 0–32)
AST: 35 IU/L (ref 0–40)
Albumin/Globulin Ratio: 1.8 (ref 1.2–2.2)
Albumin: 4.4 g/dL (ref 3.7–4.7)
Alkaline Phosphatase: 97 IU/L (ref 39–117)
BUN/Creatinine Ratio: 7 — ABNORMAL LOW (ref 12–28)
BUN: 4 mg/dL — ABNORMAL LOW (ref 8–27)
Bilirubin Total: 0.2 mg/dL (ref 0.0–1.2)
CO2: 23 mmol/L (ref 20–29)
Calcium: 9.8 mg/dL (ref 8.7–10.3)
Chloride: 93 mmol/L — ABNORMAL LOW (ref 96–106)
Creatinine, Ser: 0.55 mg/dL — ABNORMAL LOW (ref 0.57–1.00)
GFR calc Af Amer: 105 mL/min/{1.73_m2} (ref >59)
GFR calc non Af Amer: 91 mL/min/{1.73_m2} (ref >59)
Globulin, Total: 2.5 g/dL (ref 1.5–4.5)
Glucose: 107 mg/dL — ABNORMAL HIGH (ref 65–99)
Potassium: 5.1 mmol/L (ref 3.5–5.2)
Sodium: 131 mmol/L — ABNORMAL LOW (ref 134–144)
Total Protein: 6.9 g/dL (ref 6.0–8.5)

## 2019-10-01 LAB — LIPID PANEL
Chol/HDL Ratio: 2.3 ratio (ref 0.0–4.4)
Cholesterol, Total: 187 mg/dL (ref 100–199)
HDL: 81 mg/dL (ref >39)
LDL Chol Calc (NIH): 87 mg/dL (ref 0–99)
Triglycerides: 108 mg/dL (ref 0–149)
VLDL Cholesterol Cal: 19 mg/dL (ref 5–40)

## 2019-10-01 LAB — CBC
Hematocrit: 41.2 % (ref 34.0–46.6)
Hemoglobin: 13.3 g/dL (ref 11.1–15.9)
MCH: 29.3 pg (ref 26.6–33.0)
MCHC: 32.3 g/dL (ref 31.5–35.7)
MCV: 91 fL (ref 79–97)
Platelets: 400 10*3/uL (ref 150–450)
RBC: 4.54 x10E6/uL (ref 3.77–5.28)
RDW: 12.7 % (ref 11.7–15.4)
WBC: 9.8 10*3/uL (ref 3.4–10.8)

## 2019-10-01 LAB — TSH: TSH: 5.84 u[IU]/mL — ABNORMAL HIGH (ref 0.450–4.500)

## 2019-10-08 ENCOUNTER — Other Ambulatory Visit: Payer: Self-pay

## 2019-10-08 ENCOUNTER — Ambulatory Visit (HOSPITAL_COMMUNITY): Payer: Medicare HMO | Attending: Cardiovascular Disease

## 2019-10-08 ENCOUNTER — Encounter (INDEPENDENT_AMBULATORY_CARE_PROVIDER_SITE_OTHER): Payer: Self-pay

## 2019-10-08 DIAGNOSIS — I2584 Coronary atherosclerosis due to calcified coronary lesion: Secondary | ICD-10-CM | POA: Insufficient documentation

## 2019-10-08 DIAGNOSIS — I2510 Atherosclerotic heart disease of native coronary artery without angina pectoris: Secondary | ICD-10-CM | POA: Diagnosis not present

## 2019-10-09 ENCOUNTER — Telehealth: Payer: Self-pay | Admitting: Cardiovascular Disease

## 2019-10-09 NOTE — Telephone Encounter (Signed)
Patient calling in regard to lab results.

## 2019-10-09 NOTE — Telephone Encounter (Signed)
The patient was calling to check on the status of getting the cardiac ct scheduled. She has been advised that this will need to go through insurance first and that she will hopefully hear something in the next few weeks. She has verbalized her understanding.

## 2019-10-16 DIAGNOSIS — G629 Polyneuropathy, unspecified: Secondary | ICD-10-CM | POA: Diagnosis not present

## 2019-10-16 DIAGNOSIS — Z681 Body mass index (BMI) 19 or less, adult: Secondary | ICD-10-CM | POA: Diagnosis not present

## 2019-10-16 DIAGNOSIS — M1990 Unspecified osteoarthritis, unspecified site: Secondary | ICD-10-CM | POA: Diagnosis not present

## 2019-10-16 DIAGNOSIS — R32 Unspecified urinary incontinence: Secondary | ICD-10-CM | POA: Diagnosis not present

## 2019-10-16 DIAGNOSIS — Z7982 Long term (current) use of aspirin: Secondary | ICD-10-CM | POA: Diagnosis not present

## 2019-10-16 DIAGNOSIS — G8929 Other chronic pain: Secondary | ICD-10-CM | POA: Diagnosis not present

## 2019-10-16 DIAGNOSIS — K219 Gastro-esophageal reflux disease without esophagitis: Secondary | ICD-10-CM | POA: Diagnosis not present

## 2019-10-16 DIAGNOSIS — R64 Cachexia: Secondary | ICD-10-CM | POA: Diagnosis not present

## 2019-10-16 DIAGNOSIS — K5900 Constipation, unspecified: Secondary | ICD-10-CM | POA: Diagnosis not present

## 2019-10-16 DIAGNOSIS — R636 Underweight: Secondary | ICD-10-CM | POA: Diagnosis not present

## 2019-10-17 ENCOUNTER — Telehealth: Payer: Self-pay | Admitting: Cardiovascular Disease

## 2019-10-17 ENCOUNTER — Other Ambulatory Visit: Payer: Self-pay | Admitting: Cardiovascular Disease

## 2019-10-17 DIAGNOSIS — Z79899 Other long term (current) drug therapy: Secondary | ICD-10-CM

## 2019-10-17 NOTE — Telephone Encounter (Signed)
-----   Message from Benancio Deeds sent at 10/17/2019  2:05 PM EST ----- Regarding: RE: status of coronary CT? This one was approved yesterday.  It is on the list to be scheduled.    Thank you,  Caryl Pina ----- Message ----- From: Fidel Levy, RN Sent: 10/17/2019   1:42 PM EST To: [facility] Pre Cert/Auth Subject: status of coronary CT?                         Hello --   This patient was ordered a coronary CT test per Dr. Claiborne Billings. Is there an update on the status of insurance approval? When it will be scheduled?  Thanks! Eliezer Lofts RN

## 2019-10-17 NOTE — Telephone Encounter (Signed)
Patient aware insurance has approved test. Message sent to [employer].

## 2019-10-18 ENCOUNTER — Other Ambulatory Visit: Payer: Self-pay

## 2019-10-18 ENCOUNTER — Telehealth: Payer: Self-pay

## 2019-10-18 ENCOUNTER — Ambulatory Visit (INDEPENDENT_AMBULATORY_CARE_PROVIDER_SITE_OTHER): Payer: Medicare HMO | Admitting: Pulmonary Disease

## 2019-10-18 ENCOUNTER — Encounter: Payer: Self-pay | Admitting: Pulmonary Disease

## 2019-10-18 DIAGNOSIS — J432 Centrilobular emphysema: Secondary | ICD-10-CM | POA: Diagnosis not present

## 2019-10-18 DIAGNOSIS — J479 Bronchiectasis, uncomplicated: Secondary | ICD-10-CM | POA: Diagnosis not present

## 2019-10-18 DIAGNOSIS — Z79899 Other long term (current) drug therapy: Secondary | ICD-10-CM | POA: Diagnosis not present

## 2019-10-18 LAB — BASIC METABOLIC PANEL
BUN/Creatinine Ratio: 12 (ref 12–28)
BUN: 6 mg/dL — ABNORMAL LOW (ref 8–27)
CO2: 26 mmol/L (ref 20–29)
Calcium: 9.8 mg/dL (ref 8.7–10.3)
Chloride: 96 mmol/L (ref 96–106)
Creatinine, Ser: 0.52 mg/dL — ABNORMAL LOW (ref 0.57–1.00)
GFR calc Af Amer: 107 mL/min/{1.73_m2} (ref >59)
GFR calc non Af Amer: 93 mL/min/{1.73_m2} (ref >59)
Glucose: 113 mg/dL — ABNORMAL HIGH (ref 65–99)
Potassium: 6.1 mmol/L (ref 3.5–5.2)
Sodium: 133 mmol/L — ABNORMAL LOW (ref 134–144)

## 2019-10-18 NOTE — Patient Instructions (Signed)
Follow up in 4 months 

## 2019-10-18 NOTE — Telephone Encounter (Signed)
Called and spoke with pt about her blood work, pt stated she haven't been eating high potassium containing food such as banana, orange juice, pt stated she did had some tomatoes couple days ago. BMP ordered for pt to get done tomorrow.

## 2019-10-18 NOTE — Telephone Encounter (Signed)
See result box.  I have recommended patient avoid all potassium containing foods and avoid any potassium salt.  Recheck potassium in a.m. and if still elevated will need a dose of Lokelma.

## 2019-10-18 NOTE — Progress Notes (Signed)
Pulmonary, Critical Care, and Sleep Medicine  Chief Complaint  Patient presents with  . Dyspnea on exertion    Does not feel there has been any improvement since last visit in October.    Constitutional:  There were no vitals taken for this visit.  Deferred.   Past Medical History:  OA, Esophageal dysmotility, Nutcracker esophagus, Gastroparesis, GERD, Iron deficiency anemia, Neuropathy, Raynaud's  Brief Summary:  [name] is a 78 y.o. female former smoker with bronchiectasis, recurrent aspiration pneumonia with history of reflux, emphysema and history of cryptogenic organizing pneumonia.  Virtual Visit via Telephone Note  I connected with [name] on 10/18/19 at 11:30 AM EST by telephone and verified that I am speaking with the correct person using two identifiers.  Location: Patient: home Provider: medical office   I discussed the limitations, risks, security and privacy concerns of performing an evaluation and management service by telephone and the availability of in person appointments. I also discussed with the patient that there may be a patient responsible charge related to this service. The patient expressed understanding and agreed to proceed.  She was seen by Dr. Claiborne Billings with cardiology.  Has cardiac CT schedule.  Also has appointment with [facility] to assess neuropathy in January 2021.  She has cough with yellow sputum.  Can bring up phlegm, but sometimes gets stuck.  Not having fever, chest pain, or hemoptysis.  Keeps up with exercise.  She does get more fatigued.  She is also limited by neuropathy discomfort in her legs.  She hasn't been able to tolerate using flutter valve, albuterol, or hypertonic saline  Physical Exam:  Deferred  Discussion:  She has bronchiectasis with recurrent pneumonias in setting of gastroesophageal reflux.  She has recurrent episodes of respiratory infection with increased sputum production.  This contributes to intermittent  episodes of shortness of breath and fatigue.  Assessment/Plan:   Bronchiectasis, centrilobular emphysema. - discussed symptoms to monitor for that would indicate an exacerbation and needed for antibiotics - she has been unable to tolerate albuterol, hypertonic saline, flutter valve - continue prn mucinex  Coronary calcifications. - she has cardiac CT scheduled for later this month  Peripheral neuropathy. - she will be seen at [facility] in January 2021  GERD with dysphagia. - f/u with Dr. Havery Moros with GI   Patient Instructions  Follow up in 4 months   I discussed the assessment and treatment plan with the patient. The patient was provided an opportunity to ask questions and all were answered. The patient agreed with the plan and demonstrated an understanding of the instructions.   The patient was advised to call back or seek an in-person evaluation if the symptoms worsen or if the condition fails to improve as anticipated.  I provided 14 minutes of non-face-to-face time during this encounter.   Chesley Mires, MD  Pulmonary/Critical Care Pager: [phone] 10/18/2019, 11:45 AM  Flow Sheet     Pulmonary tests:  PFT 12/31/09 >> FEV1 1.84(84%), FEV1% 73, TLC 4.94(98%), DLCO 77%, +BD. PFT 06/21/16 >> FEV1 1.22 (55%), FEV1% 76, TLC 5.29 (104%), DLCO 43% PFT 12/21/16 >> FEV1 1.49 (69%), FEV1% 78, TLC 5.42 (107%), RV 158%, DLCO 51%  Chest imaging:  CT chest 08/17/10 >> mild biapical scarring, basilar peribronchovascular nodularity and mild bronchiectasis HRCT chest 11/28/17 >> mild centrilobular emphysema, peribronchovascular nodularity, mild BTX, LLL consolidation CT chest 03/02/18 >> mild emphysema, areas of BTX, several nodular areas up to 1.4 cm CT chest 08/25/18 >> centrilobular emphysema, BTX with  wall thickening and tree in bud RML/lingula/bilateral lower lobes, unchanged opacity LLL, new patchy opacities LUL  HRCT chest 09/21/19 >> apical scarring, diffuse BTX with  peribronchovascular nodularity, areas of mucoid impaction, coronary calcification  Cardiac tests:  Echo11/23/20 >> EF 60 to 65%, mild LVH, grade 2 DD, mild MR, mild TR  Medications:   Allergies as of 10/18/2019      Reactions   Cortisone    unsure   Prednisone    Sulfasalazine Other (See Comments)   Unsure of reaction   Sulfonamide Derivatives    Unsure of reaction      Medication List       Accurate as of October 18, 2019 11:45 AM. If you have any questions, ask your nurse or doctor.        STOP taking these medications   Flutter Devi Stopped by: Chesley Mires, MD   sodium chloride HYPERTONIC 3 % nebulizer solution Stopped by: Chesley Mires, MD     TAKE these medications   benzonatate 200 MG capsule Commonly known as: TESSALON Take 1 capsule (200 mg total) by mouth 3 (three) times daily as needed for cough. What changed: when to take this   Dexilant 60 MG capsule Generic drug: dexlansoprazole Take 1 capsule (60 mg total) by mouth daily.   fexofenadine 180 MG tablet Commonly known as: ALLEGRA Take 180 mg by mouth daily.   folic acid 1 MG tablet Commonly known as: FOLVITE Take 1 tablet (1 mg total) by mouth daily.   gabapentin 300 MG/6ML solution Commonly known as: NEURONTIN Take 2 mLs by mouth at bedtime.   metoprolol tartrate 50 MG tablet Commonly known as: LOPRESSOR Take 1 tablet (50 mg total) by mouth once for 1 dose. TAKE 2 HOURS PRIOR TO YOUR CORONARY CTA   NONFORMULARY OR COMPOUNDED ITEM Peripheral Neuropathy Cream: Bupivacaine 1%, Doxepin 3%, Gabapentin 6%, Pentoxifylline 3%, Topiramate 1% Order faxed to [facility]   sucralfate 1 g tablet Commonly known as: Carafate Take 1 tablet (1 g total) by mouth every 6 (six) hours as needed. Slowly dissolve 1 tablet in 1 Tablespoon of distilled water before ingesting.   triamcinolone 0.025 % cream Commonly known as: KENALOG   TURMERIC PO Take 1 tablet by mouth as needed.   TYLENOL EXTRA  STRENGTH PO Take 350 mg by mouth as needed.   VITAMIN B1 PO Take 1 tablet by mouth as needed.       Past Surgical History:  She  has a past surgical history that includes Cholecystectomy (2005); Breast lumpectomy; Total abdominal hysterectomy; Lung biopsy; Cataract extraction; Colonoscopy; Upper gastrointestinal endoscopy; Esophageal manometry (N/A, 10/06/2015); 24 hour ph study (N/A, 10/13/2015); and Esophageal manometry (N/A, 03/10/2018).  Family History:  Her family history includes Cancer in her maternal grandmother; Heart disease in her father and paternal grandfather; Leukemia in her paternal grandmother; Lung cancer in her paternal uncle; Lymphoma in her mother.  Social History:  She  reports that she quit smoking about 50 years ago. Her smoking use included cigarettes. She has never used smokeless tobacco. She reports that she does not drink alcohol or use drugs.

## 2019-10-18 NOTE — Telephone Encounter (Signed)
[facility] called the coumadin clinic and reported a critical potassium of 6.1. I will route to nya as she is covering Dr. Evette Georges service today.

## 2019-10-19 DIAGNOSIS — G603 Idiopathic progressive neuropathy: Secondary | ICD-10-CM | POA: Diagnosis not present

## 2019-10-19 DIAGNOSIS — Z0000 Encounter for general adult medical examination without abnormal findings: Secondary | ICD-10-CM | POA: Diagnosis not present

## 2019-10-19 DIAGNOSIS — I700 Atherosclerosis of aorta: Secondary | ICD-10-CM | POA: Diagnosis not present

## 2019-10-19 DIAGNOSIS — Z23 Encounter for immunization: Secondary | ICD-10-CM | POA: Diagnosis not present

## 2019-10-19 DIAGNOSIS — K219 Gastro-esophageal reflux disease without esophagitis: Secondary | ICD-10-CM | POA: Diagnosis not present

## 2019-10-19 DIAGNOSIS — J8410 Pulmonary fibrosis, unspecified: Secondary | ICD-10-CM | POA: Diagnosis not present

## 2019-10-19 DIAGNOSIS — Z79899 Other long term (current) drug therapy: Secondary | ICD-10-CM | POA: Diagnosis not present

## 2019-10-19 DIAGNOSIS — E441 Mild protein-calorie malnutrition: Secondary | ICD-10-CM | POA: Diagnosis not present

## 2019-10-19 DIAGNOSIS — Z1389 Encounter for screening for other disorder: Secondary | ICD-10-CM | POA: Diagnosis not present

## 2019-10-19 LAB — BASIC METABOLIC PANEL
BUN/Creatinine Ratio: 8 — ABNORMAL LOW (ref 12–28)
BUN: 4 mg/dL — ABNORMAL LOW (ref 8–27)
CO2: 22 mmol/L (ref 20–29)
Calcium: 9.2 mg/dL (ref 8.7–10.3)
Chloride: 90 mmol/L — ABNORMAL LOW (ref 96–106)
Creatinine, Ser: 0.53 mg/dL — ABNORMAL LOW (ref 0.57–1.00)
GFR calc Af Amer: 107 mL/min/{1.73_m2} (ref >59)
GFR calc non Af Amer: 93 mL/min/{1.73_m2} (ref >59)
Glucose: 95 mg/dL (ref 65–99)
Potassium: 5.2 mmol/L (ref 3.5–5.2)
Sodium: 127 mmol/L — ABNORMAL LOW (ref 134–144)

## 2019-10-22 ENCOUNTER — Ambulatory Visit: Payer: Medicare HMO | Admitting: Podiatry

## 2019-10-26 ENCOUNTER — Telehealth: Payer: Self-pay | Admitting: *Deleted

## 2019-10-26 DIAGNOSIS — E875 Hyperkalemia: Secondary | ICD-10-CM

## 2019-10-26 DIAGNOSIS — E871 Hypo-osmolality and hyponatremia: Secondary | ICD-10-CM

## 2019-10-26 NOTE — Telephone Encounter (Signed)
-----   Message from Troy Sine, MD sent at 10/24/2019  1:10 PM EST ----- Potassium now improved from 6.1 down to 5.2.  However serum sodium is low at 127.  Patient is not on any thiazide diuretic; would repeat bmet in 1 week

## 2019-10-26 NOTE — Telephone Encounter (Signed)
Advised patient of lab results and she will come for repeat labs

## 2019-10-29 DIAGNOSIS — E875 Hyperkalemia: Secondary | ICD-10-CM | POA: Diagnosis not present

## 2019-10-29 DIAGNOSIS — E871 Hypo-osmolality and hyponatremia: Secondary | ICD-10-CM | POA: Diagnosis not present

## 2019-10-30 DIAGNOSIS — J471 Bronchiectasis with (acute) exacerbation: Secondary | ICD-10-CM | POA: Diagnosis not present

## 2019-10-30 LAB — BASIC METABOLIC PANEL
BUN/Creatinine Ratio: 7 — ABNORMAL LOW (ref 12–28)
BUN: 4 mg/dL — ABNORMAL LOW (ref 8–27)
CO2: 26 mmol/L (ref 20–29)
Calcium: 9.3 mg/dL (ref 8.7–10.3)
Chloride: 91 mmol/L — ABNORMAL LOW (ref 96–106)
Creatinine, Ser: 0.56 mg/dL — ABNORMAL LOW (ref 0.57–1.00)
GFR calc Af Amer: 105 mL/min/{1.73_m2} (ref >59)
GFR calc non Af Amer: 91 mL/min/{1.73_m2} (ref >59)
Glucose: 86 mg/dL (ref 65–99)
Potassium: 5 mmol/L (ref 3.5–5.2)
Sodium: 130 mmol/L — ABNORMAL LOW (ref 134–144)

## 2019-11-02 DIAGNOSIS — M13842 Other specified arthritis, left hand: Secondary | ICD-10-CM | POA: Diagnosis not present

## 2019-11-06 ENCOUNTER — Telehealth: Payer: Self-pay | Admitting: Cardiovascular Disease

## 2019-11-06 NOTE — Telephone Encounter (Signed)
Left a message for the patient to call back.  

## 2019-11-06 NOTE — Telephone Encounter (Signed)
  Patient is returning call for lab results 

## 2019-11-07 ENCOUNTER — Telehealth (HOSPITAL_COMMUNITY): Payer: Self-pay | Admitting: Emergency Medicine

## 2019-11-07 NOTE — Telephone Encounter (Signed)
Reaching out to patient to offer assistance regarding upcoming cardiac imaging study; pt verbalizes understanding of appt date/time, parking situation and where to check in, pre-test NPO status and medications ordered, and verified current allergies; name and call back number provided for further questions should they arise Elizabeth Bond RN Navigator Cardiac Imaging [facility] [phone] office [phone] cell  Pt does not have PO metoprolol for test. Last OV HRs (64,67,63bpm)

## 2019-11-07 NOTE — Telephone Encounter (Signed)
Patient calling back about lab results. Please call her home phone number

## 2019-11-07 NOTE — Telephone Encounter (Signed)
Spoke with patient. Lab results reviewed.

## 2019-11-12 ENCOUNTER — Other Ambulatory Visit: Payer: Self-pay

## 2019-11-12 ENCOUNTER — Ambulatory Visit
Admission: RE | Admit: 2019-11-12 | Discharge: 2019-11-12 | Disposition: A | Discharge status: Home / Self Care | Payer: Medicare HMO | Source: Ambulatory Visit | Attending: Cardiovascular Disease | Admitting: Cardiovascular Disease

## 2019-11-12 DIAGNOSIS — I2510 Atherosclerotic heart disease of native coronary artery without angina pectoris: Secondary | ICD-10-CM

## 2019-11-12 DIAGNOSIS — R0600 Dyspnea, unspecified: Secondary | ICD-10-CM

## 2019-11-12 DIAGNOSIS — I2584 Coronary atherosclerosis due to calcified coronary lesion: Secondary | ICD-10-CM | POA: Insufficient documentation

## 2019-11-12 DIAGNOSIS — R0609 Other forms of dyspnea: Secondary | ICD-10-CM

## 2019-11-12 MED ORDER — METOPROLOL TARTRATE 5 MG/5ML IV SOLN
5.0000 mg | INTRAVENOUS | Status: DC | PRN
  Administered 2019-11-12: 12:00:00 5 mg via INTRAVENOUS

## 2019-11-12 MED ORDER — NITROGLYCERIN 0.4 MG SL SUBL
0.8000 mg | SUBLINGUAL_TABLET | Freq: Once | SUBLINGUAL | Status: AC
  Administered 2019-11-12: 0.8 mg via SUBLINGUAL

## 2019-11-12 MED ORDER — IOHEXOL 350 MG/ML SOLN
75.0000 mL | Freq: Once | INTRAVENOUS | Status: AC | PRN
  Administered 2019-11-12: 75 mL via INTRAVENOUS

## 2019-11-12 NOTE — Progress Notes (Signed)
Patient tolerated CT without incident. Did not want anything to eat or drink. Ambulatory steady gait to exit.

## 2019-11-17 ENCOUNTER — Other Ambulatory Visit: Payer: Self-pay | Admitting: Gastroenterology

## 2019-11-19 ENCOUNTER — Telehealth: Payer: Self-pay | Admitting: Neurology

## 2019-11-19 NOTE — Telephone Encounter (Signed)
Patient called requesting some help. She said she is scheduled to see Dr. Idalia Needle at [facility] but cannot reach the office to confirm her appointment. She said she has tried many, many times to reach that office to no avail.  [phone]  I called the office for the patient and was on hold for 10 minutes but was finally able to connect and confirm the appointment on 11/26/2019 at 9:00 at that facility. Patient is aware and appreciative.

## 2019-11-25 ENCOUNTER — Other Ambulatory Visit: Payer: Self-pay | Admitting: Pulmonary Disease

## 2019-11-26 DIAGNOSIS — Z79899 Other long term (current) drug therapy: Secondary | ICD-10-CM | POA: Diagnosis not present

## 2019-11-26 DIAGNOSIS — G629 Polyneuropathy, unspecified: Secondary | ICD-10-CM | POA: Diagnosis not present

## 2019-11-26 NOTE — Telephone Encounter (Signed)
Dr. Sood, please advise if you are okay with us refilling med. 

## 2019-11-27 DIAGNOSIS — R69 Illness, unspecified: Secondary | ICD-10-CM | POA: Diagnosis not present

## 2019-12-05 ENCOUNTER — Ambulatory Visit: Payer: Medicare HMO | Admitting: Podiatry

## 2019-12-05 ENCOUNTER — Other Ambulatory Visit: Payer: Self-pay | Admitting: Geriatric Medicine

## 2019-12-05 ENCOUNTER — Other Ambulatory Visit: Payer: Self-pay

## 2019-12-05 ENCOUNTER — Other Ambulatory Visit: Payer: Medicare HMO

## 2019-12-05 ENCOUNTER — Ambulatory Visit
Admission: RE | Admit: 2019-12-05 | Discharge: 2019-12-05 | Disposition: A | Discharge status: Home / Self Care | Payer: Medicare HMO | Source: Ambulatory Visit | Attending: Geriatric Medicine | Admitting: Geriatric Medicine

## 2019-12-05 DIAGNOSIS — R0789 Other chest pain: Secondary | ICD-10-CM

## 2019-12-10 ENCOUNTER — Encounter: Payer: Self-pay | Admitting: Podiatry

## 2019-12-10 ENCOUNTER — Ambulatory Visit (INDEPENDENT_AMBULATORY_CARE_PROVIDER_SITE_OTHER): Payer: Medicare HMO | Admitting: Podiatry

## 2019-12-10 ENCOUNTER — Other Ambulatory Visit: Payer: Self-pay

## 2019-12-10 DIAGNOSIS — M79675 Pain in left toe(s): Secondary | ICD-10-CM | POA: Diagnosis not present

## 2019-12-10 DIAGNOSIS — M79674 Pain in right toe(s): Secondary | ICD-10-CM | POA: Diagnosis not present

## 2019-12-10 DIAGNOSIS — L84 Corns and callosities: Secondary | ICD-10-CM

## 2019-12-10 DIAGNOSIS — B351 Tinea unguium: Secondary | ICD-10-CM

## 2019-12-10 DIAGNOSIS — G629 Polyneuropathy, unspecified: Secondary | ICD-10-CM

## 2019-12-10 NOTE — Patient Instructions (Signed)

## 2019-12-10 NOTE — Progress Notes (Signed)
Subjective: [name] presents today for follow up of corn(s) distal tip left 3rd digit and painful mycotic toenails b/l that are difficult to trim. Pain interferes with ambulation. Aggravating factors include wearing enclosed shoe gear. Pain is relieved with periodic professional debridement..   Allergies  Allergen Reactions  . Cortisone     unsure  . Prednisone   . Sulfasalazine Other (See Comments)    Unsure of reaction  . Sulfonamide Derivatives     Unsure of reaction     Objective: There were no vitals filed for this visit.  Vascular Examination:  capillary refill time to digits immediate b/l, palpable DP pulses b/l, palpable PT pulses b/l, pedal hair sparse b/l and skin temperature gradient within normal limits b/l  Dermatological Examination: Pedal skin is thin shiny, atrophic bilaterally, no open wounds bilaterally, toenails 1-5 b/l elongated, dystrophic, thickened, crumbly with subungual debris and hyperkeratotic lesion(s) distal tip left 3rd digit.  No erythema, no edema, no drainage, no flocculence  Musculoskeletal: normal muscle strength 5/5 to all lower extremity muscle groups bilaterally, no pain crepitus or joint limitation noted with ROM b/l and hammertoes noted to the left, right, 2nd toe, 3rd toe, 4th toe, 5th toe  Neurological: sensation slightly decreased to plantar midfoot b/l  Assessment: 1. Pain due to onychomycosis of toenails of both feet   2. Corns   3. Peripheral polyneuropathy     Plan: -Toenails 1-5 b/l were debrided in length and girth without iatrogenic bleeding. -The above corns and calluses were debrided without complication or incident. Total number debrided =1 to left 3rd toe -Patient to continue soft, supportive shoe gear daily. -Patient to report any pedal injuries to medical professional immediately. -Patient/POA to call should there be question/concern in the interim.  Return in about 3 months (around 03/09/2020) for nail trim.

## 2019-12-12 ENCOUNTER — Other Ambulatory Visit: Payer: Self-pay

## 2019-12-12 ENCOUNTER — Encounter: Payer: Self-pay | Admitting: Cardiovascular Disease

## 2019-12-12 ENCOUNTER — Ambulatory Visit (INDEPENDENT_AMBULATORY_CARE_PROVIDER_SITE_OTHER): Payer: Medicare HMO | Admitting: Cardiovascular Disease

## 2019-12-12 VITALS — BP 132/85 | HR 74 | Temp 97.3°F | Ht 64.0 in | Wt 87.8 lb

## 2019-12-12 DIAGNOSIS — I5189 Other ill-defined heart diseases: Secondary | ICD-10-CM

## 2019-12-12 DIAGNOSIS — I872 Venous insufficiency (chronic) (peripheral): Secondary | ICD-10-CM | POA: Diagnosis not present

## 2019-12-12 DIAGNOSIS — I519 Heart disease, unspecified: Secondary | ICD-10-CM | POA: Diagnosis not present

## 2019-12-12 DIAGNOSIS — J479 Bronchiectasis, uncomplicated: Secondary | ICD-10-CM | POA: Diagnosis not present

## 2019-12-12 DIAGNOSIS — I10 Essential (primary) hypertension: Secondary | ICD-10-CM

## 2019-12-12 DIAGNOSIS — R0609 Other forms of dyspnea: Secondary | ICD-10-CM

## 2019-12-12 DIAGNOSIS — I2510 Atherosclerotic heart disease of native coronary artery without angina pectoris: Secondary | ICD-10-CM | POA: Diagnosis not present

## 2019-12-12 DIAGNOSIS — R0600 Dyspnea, unspecified: Secondary | ICD-10-CM

## 2019-12-12 DIAGNOSIS — I700 Atherosclerosis of aorta: Secondary | ICD-10-CM

## 2019-12-12 DIAGNOSIS — I2584 Coronary atherosclerosis due to calcified coronary lesion: Secondary | ICD-10-CM | POA: Diagnosis not present

## 2019-12-12 MED ORDER — SPIRONOLACTONE 25 MG PO TABS: 12.5000 mg | ORAL_TABLET | Freq: Every day | ORAL | 3 refills | Status: DC
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
## 2019-12-12 NOTE — Patient Instructions (Signed)
Medication Instructions:  BEGIN TAKING SPIRONOLACTONE 12.5MG = 1/2 TABLET DAILY *If you need a refill on your cardiac medications before your next appointment, please call your pharmacy*  Follow-Up: At [facility], you and your health needs are our priority.  As part of our continuing mission to provide you with exceptional heart care, we have created designated Provider Care Teams.  These Care Teams include your primary Cardiologist (physician) and Advanced Practice Providers (APPs -  Physician Assistants and Nurse Practitioners) who all work together to provide you with the care you need, when you need it.  Your next appointment:   6 month(s)  The format for your next appointment:   In Person  Provider:   Shelva Majestic, MD

## 2019-12-13 DIAGNOSIS — R202 Paresthesia of skin: Secondary | ICD-10-CM | POA: Diagnosis not present

## 2019-12-13 DIAGNOSIS — R208 Other disturbances of skin sensation: Secondary | ICD-10-CM | POA: Diagnosis not present

## 2019-12-13 DIAGNOSIS — R209 Unspecified disturbances of skin sensation: Secondary | ICD-10-CM | POA: Diagnosis not present

## 2019-12-14 ENCOUNTER — Encounter: Payer: Self-pay | Admitting: Cardiovascular Disease

## 2019-12-14 NOTE — Addendum Note (Signed)
Addended by: Hinton Dyer on: 12/14/2019 01:43 PM   Modules accepted: Orders

## 2019-12-18 ENCOUNTER — Ambulatory Visit (INDEPENDENT_AMBULATORY_CARE_PROVIDER_SITE_OTHER): Payer: Medicare HMO | Admitting: Nurse Practitioner

## 2019-12-18 ENCOUNTER — Other Ambulatory Visit: Payer: Self-pay

## 2019-12-18 ENCOUNTER — Encounter: Payer: Self-pay | Admitting: Nurse Practitioner

## 2019-12-18 VITALS — BP 122/76 | HR 64 | Temp 97.6°F | Ht 64.0 in | Wt 88.8 lb

## 2019-12-18 DIAGNOSIS — K219 Gastro-esophageal reflux disease without esophagitis: Secondary | ICD-10-CM | POA: Diagnosis not present

## 2019-12-18 DIAGNOSIS — R1310 Dysphagia, unspecified: Secondary | ICD-10-CM | POA: Diagnosis not present

## 2019-12-18 NOTE — Patient Instructions (Signed)
If you are age 77 or older, your body mass index should be between 23-30. Your Body mass index is 15.24 kg/m. If this is out of the aforementioned range listed, please consider follow up with your Primary Care Provider.  If you are age 6 or younger, your body mass index should be between 19-25. Your Body mass index is 15.24 kg/m. If this is out of the aformentioned range listed, please consider follow up with your Primary Care Provider.   Follow up as needed  Thank you for choosing me and [facility].   Tye Savoy, NP

## 2019-12-18 NOTE — Progress Notes (Signed)
IMPRESSION and PLAN:    79 yo female with chronic severe GERD and a hypercontractile esophagus. Treatment is challenging given suboptimal response to some medications, her concern about potential side effects of other meds and her fear of surgical / procedural complications. Unfortunately the dysphagia is getting worse.  - Patient cannot remember if she tried baclofen when prescribed a year ago.. I initially thought about trying her on a low dose but then read in Dr. Vivia Ewing consultation for TIF procedure that the baclofen was discontinued due to no perceived benefit -I will talk with Dr. Havery Moros her primary PPI about any remaining options. I will also ask him to dictate a letter to insurance company explaining necessity of staying on [employer].        HPI:    Primary GI: [place] Cellar, MD  Chief complaint : needs medication refill, dysphagia, throat pain   Patient is a 79 year old female with PMH significant for hypertension, CAD, grade 2 diastolic dysfunction, bronchiectasis.  She is well-known to Dr. Havery Moros for history of severe GERD and esophageal dysmotility. She has a hyper contractile esophagus.  She has been tried on various medications without improvement.  She has been concerned about trying a TCA.  We prescribed a low-dose of baclofen last year, patient cannot remember whether or not she tried it but itsn't on it now.  We referred her to [facility]-Dr. Hassell Done,  but she was nervous about surgical risks and declined intervention. We referred her to Dr. Bryan Lemma in Jan 2020 for possible TIF.  Despite a hypercontractile esophagus she was a candidate for TIF but declined due to concerns about procedure risks. She has been maintained on Carafate and Dexilant but now having some difficulty getting insurance to pay for either. We apparently wrote a letter to the insurance company for Carafate suspension but patient said drug was denied and she is not interested in the  pill form.  Now she has been notified that Dexilant will also need a letter from Dr. Havery Moros. Ms Andreason, in addition to needing a letter for Dexilant/refill, she offers that the dysphagia is getting worse.  She is on a soft diet.  She cannot remember trying the baclofen so cannot provide any feedback. Her throat stays sore, it hurts to swallow ( this is not new ).    Review of systems:     No chest pain, no SOB, no fevers, no urinary sx   Past Medical History:  Diagnosis Date  . Allergy   . Arthritis   . BOOP (bronchiolitis obliterans with organizing pneumonia) ([place])   . Bronchiectasis   . Cataract   . Esophageal dysmotility   . Gastroparesis   . GERD (gastroesophageal reflux disease)    severe  . Iron deficiency anemia   . Neuropathy   . Nutcracker esophagus   . Pneumonia   . Raynaud's disease   . Right lower lobe pneumonia December 2013    Patient's surgical history, family medical history, social history, medications and allergies were all reviewed in Epic    Current Outpatient Medications  Medication Sig Dispense Refill  . Acetaminophen (TYLENOL EXTRA STRENGTH PO) Take 350 mg by mouth as needed.     . benzonatate (TESSALON) 200 MG capsule Take 1 capsule (200 mg total) by mouth as needed for cough. 90 capsule 3  . capsaicin (ZOSTRIX) 0.025 % cream Apply topically.    [name] dexlansoprazole (DEXILANT) 60 MG capsule Take 1 capsule (60 mg  total) by mouth daily. Please schedule a yearly office visit for further refills. 90 capsule 0  . fexofenadine (ALLEGRA) 180 MG tablet Take 180 mg by mouth daily.    . folic acid (FOLVITE) 1 MG tablet Take 1 tablet (1 mg total) by mouth daily. 30 tablet 3  . Gabapentin 300 MG/6ML SOLN Take 2 mLs by mouth at bedtime. 470 mL 11  . NONFORMULARY OR COMPOUNDED ITEM Peripheral Neuropathy Cream: Bupivacaine 1%, Doxepin 3%, Gabapentin 6%, Pentoxifylline 3%, Topiramate 1% Order faxed to [place] 1 each 3  . spironolactone (ALDACTONE) 25 MG  tablet Take 0.5 tablets (12.5 mg total) by mouth daily. 45 tablet 3  . Thiamine Mononitrate (VITAMIN B1 PO) Take 1 tablet by mouth as needed.     . triamcinolone (KENALOG) 0.025 % cream     . TURMERIC PO Take 1 tablet by mouth as needed.     No current facility-administered medications for this visit.    Physical Exam:     BP 122/76   Pulse 64   Temp 97.6 F (36.4 C)   Ht 5\' 4"  (1.626 m)   Wt 88 lb 12.8 oz (40.3 kg)   BMI 15.24 kg/m   GENERAL:  Pleasant female in NAD PSYCH: : Cooperative, normal affect EENT:  mucous membranes moist, no oral candida seen CARDIAC:  RRR,  no peripheral edema PULM: Normal respiratory effort, lungs CTA bilaterally, no wheezing ABDOMEN:  Nondistended, soft, nontender. No obvious masses, no hepatomegaly,  normal bowel sounds SKIN:  turgor, no lesions seen Musculoskeletal:  Normal muscle tone, normal strength NEURO: Alert and oriented x 3, no focal neurologic deficits  I spent 30 minutes total reviewing records, obtaining history, performing exam, counseling patient and documenting visit / findings.   Tye Savoy , NP 12/18/2019, 11:10 AM

## 2019-12-19 ENCOUNTER — Telehealth: Payer: Self-pay

## 2019-12-19 NOTE — Telephone Encounter (Signed)
Great thanks for letting me know and thanks for following up with the pharmacist

## 2019-12-19 NOTE — Telephone Encounter (Signed)
Called patient and let her know her Ins. Co. has changed the Tier for her Dexilant, and she can now get it for 1/2 the cost. Also asked her if she has reconsidered the TIF procedure or surgery for her Reflux. She wants to come in and discuss this with Dr. Havery Moros. Scheduled an office visit on 01/18/20.

## 2019-12-19 NOTE — Progress Notes (Signed)
Unfortunately this patient has refractory GERD to medical therapy. Her Demeester score on prior PH study was > 150. She is a candidate for TIF and has also been offered a Nissen per surgery, but has declined both. I think her best option for lasting and more definitive therapy is one of these interventions. I recommend TIF as it has less risks and she is a candidate for this after review of Dr. Vivia Ewing note. Her last manometry did not meet criteria for [place] or hypercontractile esophagus. If her quality of life is poor due to ongoing severe symptoms, it is up to her if she wishes to try something other than medical therapy, and would recommend she follow up with Dr. Bryan Lemma if she wishes to reconsider TIF. Otherwise we can draft a letter of medical necessity regarding the [place].   Sherlynn Stalls, can you help draft this patient a letter stating she requires Dexilant for long term management of reflux given failure of other medical therapies, until she can proceed with either an antireflux procedure or surgery. Thank you

## 2019-12-19 NOTE — Telephone Encounter (Signed)
[employer] and spoke with one of their pharmacist. Based on 1- failure of other medical therapies and 2- until she can proceed with either an antireflux procedure or surgery; He processed a Licensed conveyancer and changed the Tier of the medication from Tier 4 to Tier 3. This should change her co-pay from $100 per month to about $47 per month. She probably will still have to meet her $150 prescription deductable. [id number]

## 2019-12-19 NOTE — Telephone Encounter (Signed)
Okay; thanks.

## 2019-12-19 NOTE — Telephone Encounter (Signed)
-----   Message from Willia Craze, NP sent at 12/19/2019  1:39 PM EST ----- Hi Ester, I tried to call patient. She picked up but then it sounded like she was talking to someone else in the background or something.  When the letter was drafted and you notify her will you also tell her what Dr. Havery Moros said about remaining options for treatment.  He is recommending she follow-up with Dr. Bryan Lemma to reconsider a TIF if medical management is no longer working.  Thanks [employer]

## 2019-12-27 NOTE — Telephone Encounter (Signed)
More recent labs were done on 10/29/2019 [name] contacted pt on 11/14/19 regarding results

## 2020-01-02 ENCOUNTER — Other Ambulatory Visit: Payer: Self-pay | Admitting: Neurology

## 2020-01-07 DIAGNOSIS — I7300 Raynaud's syndrome without gangrene: Secondary | ICD-10-CM | POA: Diagnosis not present

## 2020-01-07 DIAGNOSIS — G6289 Other specified polyneuropathies: Secondary | ICD-10-CM | POA: Diagnosis not present

## 2020-01-18 ENCOUNTER — Encounter: Payer: Self-pay | Admitting: Gastroenterology

## 2020-01-18 ENCOUNTER — Ambulatory Visit (INDEPENDENT_AMBULATORY_CARE_PROVIDER_SITE_OTHER): Payer: Medicare HMO | Admitting: Gastroenterology

## 2020-01-18 VITALS — BP 110/70 | Temp 97.5°F | Ht 64.0 in | Wt 84.0 lb

## 2020-01-18 DIAGNOSIS — K219 Gastro-esophageal reflux disease without esophagitis: Secondary | ICD-10-CM

## 2020-01-18 MED ORDER — GAVISCON EXTRA STRENGTH 160-105 MG PO CHEW: CHEWABLE_TABLET | ORAL | 0 refills | Status: DC

## 2020-01-18 NOTE — Progress Notes (Signed)
HPI :  79 year old female here for a follow-up for reflux, also with history of chronic lung disease and bronchiectasis followed by pulmonary.  See her extensive evaluation in the past as outlined below.  She has had a DeMeester score of greater than 150.  She has ongoing pyrosis, water brash and a sore throat.  She is had an extensive evaluation for this.  She had an EGD you in 2020 for this, no significant hiatal hernia.  She is on Dexilant and Carafate with ongoing symptoms and was referred to see Dr. Bryan Lemma for TIF procedure.  She is deemed a candidate for TIF however has been quite anxious about proceeding with this.  She lives alone and is concerned about her recovery and if she needs assistance at home with this.  She has ongoing dry cough she thinks related to her reflux.  Liquid Carafate has helped her however she has a hard time getting it, insurance is not covering it, the oral Carafate pills have not worked for her despite dissolving them in water.  She is asking about other options.  She has seen Dr. Hassell Done of general surgery to discuss Nissen fundoplication and wants to avoid surgery if possible.   She's had an extensive workup for her reflux with [place] in the past as outlined: EGD 08/2015 - 1cm HH, gastritis without ulceration, HP negative pH study done in 2016 showingDemeester score of 152.7 but only symptom index of 23% for reflux. Most of reflux was weakly acidic. Esophageal manometry showed what was thought to be nutcracker esophagusat the time Esophogram 10/10/2017 - small sliding type hiatal hernia, no distal stricture / mass, normal motility Repeat manometry 03/10/2018 - hypercontractile esophagus, no other significant pathology  Colonoscopy 11/20/18 -  The perianal and digital rectal examinations were normal. - The terminal ileum appeared normal. - A 3 mm polyp was found in the ileocecal valve. The polyp was sessile. The polyp was removed with a cold snare. Resection and  retrieval were complete. - A 3 mm polyp was found in the rectum. The polyp was sessile. The polyp was removed with a cold snare. Resection and retrieval were complete. - The exam was otherwise without abnormality other than some tortousity. - Biopsies for histology were taken with a cold forceps from the right colon and left colon for evaluation of microscopic colitis given the patient's incidental complaints of persistent loose Stools.  EGD 11/20/18: - The z line was slightly irregular but did not meet biopsy criteria for BE (previously biopsied as well as negative). The exam of the esophagus was otherwise normal. - The entire examined stomach was normal. Hill grade II on retroflexed views (although challenging to get a good photo of this) - A single 3 to 4 mm sessile polyp was found in the second portion of the duodenum. The polyp was removed with a cold snare. Resection and retrieval were complete. Relook at the site prior to the end of the case showed no bleeding. - The exam of the duodenum was otherwise normal.    Past Medical History:  Diagnosis Date  . Allergy   . Arthritis   . BOOP (bronchiolitis obliterans with organizing pneumonia) ([place])   . Bronchiectasis   . Cataract   . Esophageal dysmotility   . Gastroparesis   . GERD (gastroesophageal reflux disease)    severe  . Iron deficiency anemia   . Neuropathy   . Pneumonia   . Raynaud's disease   . Right lower lobe pneumonia December  2013     Past Surgical History:  Procedure Laterality Date  . [place] STUDY N/A 10/13/2015   Procedure: [place] STUDY;  Surgeon: Gatha Mayer, MD;  Location: [facility];  Service: Endoscopy;  Laterality: N/A;  . BREAST LUMPECTOMY     left, x 2  . CATARACT EXTRACTION     both eyes  . CHOLECYSTECTOMY  2005  . COLONOSCOPY    . ESOPHAGEAL MANOMETRY N/A 10/06/2015   Procedure: ESOPHAGEAL MANOMETRY (EM);  Surgeon: Manus Gunning, MD;  Location: [facility];  Service:  Gastroenterology;  Laterality: N/A;  . ESOPHAGEAL MANOMETRY N/A 03/10/2018   Procedure: ESOPHAGEAL MANOMETRY (EM);  Surgeon: Mauri Pole, MD;  Location: [facility];  Service: Endoscopy;  Laterality: N/A;  . LUNG BIOPSY     vats right 2003  . TOTAL ABDOMINAL HYSTERECTOMY    . UPPER GASTROINTESTINAL ENDOSCOPY     Family History  Problem Relation Age of Onset  . Lymphoma Mother   . Heart disease Father   . Lung cancer Paternal Uncle   . Heart disease Paternal Grandfather   . Leukemia Paternal Grandmother   . Cancer Maternal Grandmother        unknown type  . Colon cancer Neg Hx   . Esophageal cancer Neg Hx   . Rectal cancer Neg Hx   . Stomach cancer Neg Hx    Social History   Tobacco Use  . Smoking status: Former Smoker    Types: Cigarettes    Quit date: 11/15/1968    Years since quitting: 51.2  . Smokeless tobacco: Never Used  . Tobacco comment: 3 cigs a day  Substance Use Topics  . Alcohol use: No  . Drug use: No   Current Outpatient Medications  Medication Sig Dispense Refill  . Acetaminophen (TYLENOL EXTRA STRENGTH PO) Take 350 mg by mouth as needed.     . benzonatate (TESSALON) 200 MG capsule Take 1 capsule (200 mg total) by mouth as needed for cough. 90 capsule 3  . capsaicin (ZOSTRIX) 0.025 % cream Apply topically.    [name] dexlansoprazole (DEXILANT) 60 MG capsule Take 1 capsule (60 mg total) by mouth daily. Please schedule a yearly office visit for further refills. 90 capsule 0  . fexofenadine (ALLEGRA) 180 MG tablet Take 180 mg by mouth daily.    . folic acid (FOLVITE) 1 MG tablet Take 1 tablet (1 mg total) by mouth daily. 30 tablet 3  . gabapentin (NEURONTIN) 250 MG/5ML solution TAKE 2 ML AT BEDTIME. 180 mL 0  . NONFORMULARY OR COMPOUNDED ITEM Peripheral Neuropathy Cream: Bupivacaine 1%, Doxepin 3%, Gabapentin 6%, Pentoxifylline 3%, Topiramate 1% Order faxed to [place] 1 each 3  . spironolactone (ALDACTONE) 25 MG tablet Take 0.5 tablets (12.5 mg  total) by mouth daily. 45 tablet 3  . triamcinolone (KENALOG) 0.025 % cream     . TURMERIC PO Take 1 tablet by mouth as needed.    . Alum Hydroxide-Mag Carbonate (GAVISCON EXTRA STRENGTH) 160-105 MG CHEW Use as directed  0   No current facility-administered medications for this visit.   Allergies  Allergen Reactions  . Cortisone     unsure  . Prednisone   . Sulfasalazine Other (See Comments)    Unsure of reaction  . Sulfonamide Derivatives     Unsure of reaction     Review of Systems: All systems reviewed and negative except where noted in HPI.    No results found.  Physical Exam: BP  110/70   Temp (!) 97.5 F (36.4 C)   Ht 5\' 4"  (1.626 m)   Wt 84 lb (38.1 kg)   BMI 14.42 kg/m  Constitutional: Pleasant,well-developed, female in no acute distress. Neurological: Alert and oriented to person place and time. Psychiatric: Normal mood and affect. Behavior is normal.    ASSESSMENT AND PLAN: 79 year old female here for reassessment of the following issues:  GERD / bronchiectasis with recurrent pneumonia - she has severe reflux as evidenced by her prior pH study and ongoing symptoms.  Symptoms persist despite high-dose Dexilant.  Liquid Carafate helps however she is not able to get insurance coverage for this.  She has had a surgical opinion as well as seen Dr. Bryan Lemma for consideration for TIF.  After review of her records she has been deemed to be a candidate for TIF, it is not thought that she has a significant motility disorder that would prohibit this.  I think this is her best option to manage the symptoms moving forward as she has clearly failed medical therapy and its affecting her pulmonary function.  She has questions and concerns about her recovery from a TIF which I outlined at length with her.  She would be expected to be admitted overnight and discharged the next day, this is not an operation and I do not think she will have a significant difficulty with recovery.   She would need to alter her diet and be mostly liquids for period of time.  She thinks she wants to proceed with TIF however however wishes to think about the best timing for her.  She will contact me and Dr. Bryan Lemma when she is ready to proceed.  Otherwise in the interim given she cannot get liquid Carafate we will try some over-the-counter Gaviscon as needed for breakthrough.  She agreed  I spent 35 minutes of time, including in depth chart review, independent review of results as outlined above, communicating results with the patient directly, face-to-face time with the patient, coordinating care, and ordering studies and medications as appropriate, and documenting the encounter.   [place] Cellar, MD [facility]

## 2020-01-18 NOTE — Patient Instructions (Addendum)
If you are age 77 or older, your body mass index should be between 23-30. Your Body mass index is 14.42 kg/m. If this is out of the aforementioned range listed, please consider follow up with your Primary Care Provider.  If you are age 60 or younger, your body mass index should be between 19-25. Your Body mass index is 14.42 kg/m. If this is out of the aformentioned range listed, please consider follow up with your Primary Care Provider.    Please purchase the following medications over the counter and take as directed: Gaviscon  Continue Dexilant.  Thank you for entrusting me with your care and for choosing [facility], Dr. [place] Cellar

## 2020-01-21 ENCOUNTER — Telehealth: Payer: Self-pay | Admitting: Gastroenterology

## 2020-01-21 NOTE — Telephone Encounter (Signed)
Called patient back and left message on her voice mail that I was sending her requested TIF pamphlets out to her.

## 2020-01-21 NOTE — Telephone Encounter (Signed)
Pt requested a call back to discuss TIF procedure.  She would like a pamphlet mailed to her so she can better understand procedure.

## 2020-01-24 DIAGNOSIS — L570 Actinic keratosis: Secondary | ICD-10-CM | POA: Diagnosis not present

## 2020-01-24 DIAGNOSIS — L820 Inflamed seborrheic keratosis: Secondary | ICD-10-CM | POA: Diagnosis not present

## 2020-01-24 DIAGNOSIS — Z85828 Personal history of other malignant neoplasm of skin: Secondary | ICD-10-CM | POA: Diagnosis not present

## 2020-01-28 ENCOUNTER — Telehealth: Payer: Self-pay | Admitting: Gastroenterology

## 2020-01-28 ENCOUNTER — Other Ambulatory Visit: Payer: Self-pay | Admitting: Geriatric Medicine

## 2020-01-28 ENCOUNTER — Ambulatory Visit (INDEPENDENT_AMBULATORY_CARE_PROVIDER_SITE_OTHER): Payer: Medicare HMO | Admitting: Adult Health

## 2020-01-28 ENCOUNTER — Ambulatory Visit
Admission: RE | Admit: 2020-01-28 | Discharge: 2020-01-28 | Disposition: A | Discharge status: Home / Self Care | Payer: Medicare HMO | Source: Ambulatory Visit | Attending: Geriatric Medicine | Admitting: Geriatric Medicine

## 2020-01-28 ENCOUNTER — Other Ambulatory Visit: Payer: Self-pay

## 2020-01-28 ENCOUNTER — Encounter: Payer: Self-pay | Admitting: Adult Health

## 2020-01-28 DIAGNOSIS — R059 Cough, unspecified: Secondary | ICD-10-CM

## 2020-01-28 DIAGNOSIS — J471 Bronchiectasis with (acute) exacerbation: Secondary | ICD-10-CM | POA: Diagnosis not present

## 2020-01-28 DIAGNOSIS — R05 Cough: Secondary | ICD-10-CM

## 2020-01-28 MED ORDER — AMOXICILLIN-POT CLAVULANATE 875-125 MG PO TABS: 1.0000 | ORAL_TABLET | Freq: Two times a day (BID) | ORAL | 0 refills | Status: AC

## 2020-01-28 NOTE — Patient Instructions (Addendum)
Augmentin 875mg  Twice daily  For 1 week. ,take with food.  Robitussin DM As needed  Cough  Tessalon Three times a day  As needed  Cough .  COVID test as discussed.  Push Covid vaccine to next week.  Follow up with Dr. Halford Chessman  In 6 weeks and As needed   Please contact office for sooner follow up if symptoms do not improve or worsen or seek emergency care

## 2020-01-28 NOTE — Progress Notes (Signed)
Virtual Visit via Telephone Note  I connected with [name] on 01/28/20 at  9:30 AM EDT by telephone and verified that I am speaking with the correct person using two identifiers.  Location: Patient: Home  Provider: Office    I discussed the limitations, risks, security and privacy concerns of performing an evaluation and management service by telephone and the availability of in person appointments. I also discussed with the patient that there may be a patient responsible charge related to this service. The patient expressed understanding and agreed to proceed.   History of Present Illness: 79 year old female former smoker followed for bronchiectasis, recurrent aspiration pneumonia with history of reflux and history of cryptogenic organizing pneumonia  Today's televisit is for an acute office visit.  Patient complains over the last 5 days she has had increased cough, congestion with thick yellow mucus, severe coughing paroxysms.  She has been taking Tessalon with minimal help.  She denies any fever, loss of taste or smell, hemoptysis, chest pain, orthopnea.  Appetite is fair.  No nausea vomiting or diarrhea.  No known sick contacts.  Patient has received her first Covid vaccine and has plans for her second Covid vaccine tomorrow as scheduled  Patient says she has been very good during the pandemic and wears her mask and social distance.  She lives alone. Patient has tried flutter valve, hypertonic saline and albuterol without perceived benefit.  And feels that she has intolerances to these.   Patient Active Problem List   Diagnosis Date Noted  . Venous insufficiency 01/02/2019  . Carpal tunnel syndrome of right wrist 11/24/2018  . Sore throat 12/08/2017  . Hoarseness 12/08/2017  . Abnormal CT scan 12/06/2017  . Laryngopharyngeal reflux (LPR) 10/03/2017  . Temporomandibular jaw dysfunction 10/03/2017  . Tension-type headache, not intractable 09/12/2017  . Abdominal pain 04/28/2017   . Other fatigue 04/28/2017  . Pneumonia 12/14/2016  . Neuropathy 07/30/2016  . TIA (transient ischemic attack) 06/22/2016  . New onset of headaches after age 89 06/22/2016  . Dysphagia   . Allergic reaction 01/11/2015  . Arthritis of right ankle 10/09/2013  . Pes cavus 10/09/2013  . Loss of transverse plantar arch 10/09/2013  . Allergic rhinitis 05/14/2013  . BRONCHIECTASIS 12/15/2010  . Nutcracker esophagus 01/26/2008  . GERD 01/26/2008  . GASTROPARESIS 01/26/2008    Current Outpatient Medications on File Prior to Visit  Medication Sig Dispense Refill  . Acetaminophen (TYLENOL EXTRA STRENGTH PO) Take 350 mg by mouth as needed.     . Alum Hydroxide-Mag Carbonate (GAVISCON EXTRA STRENGTH) 160-105 MG CHEW Use as directed  0  . benzonatate (TESSALON) 200 MG capsule Take 1 capsule (200 mg total) by mouth as needed for cough. 90 capsule 3  . capsaicin (ZOSTRIX) 0.025 % cream Apply topically.    [name] dexlansoprazole (DEXILANT) 60 MG capsule Take 1 capsule (60 mg total) by mouth daily. Please schedule a yearly office visit for further refills. 90 capsule 0  . fexofenadine (ALLEGRA) 180 MG tablet Take 180 mg by mouth daily.    . folic acid (FOLVITE) 1 MG tablet Take 1 tablet (1 mg total) by mouth daily. 30 tablet 3  . gabapentin (NEURONTIN) 250 MG/5ML solution TAKE 2 ML AT BEDTIME. 180 mL 0  . NONFORMULARY OR COMPOUNDED ITEM Peripheral Neuropathy Cream: Bupivacaine 1%, Doxepin 3%, Gabapentin 6%, Pentoxifylline 3%, Topiramate 1% Order faxed to [place] 1 each 3  . spironolactone (ALDACTONE) 25 MG tablet Take 0.5 tablets (12.5 mg total) by mouth daily.  45 tablet 3  . triamcinolone (KENALOG) 0.025 % cream     . TURMERIC PO Take 1 tablet by mouth as needed.     No current facility-administered medications on file prior to visit.       Observations/Objective:  PFT 12/31/09 >> FEV1 1.84(84%), FEV1% 73, TLC 4.94(98%), DLCO 77%, +BD. PFT 06/21/16 >> FEV1 1.22 (55%), FEV1% 76, TLC  5.29 (104%), DLCO 43% PFT 12/21/16 >> FEV1 1.49 (69%), FEV1% 78, TLC 5.42 (107%), RV 158%, DLCO 51%  CT chest 08/17/10 >> mild biapical scarring, basilar peribronchovascular nodularity and mild bronchiectasis HRCT chest 11/28/17 >> mild centrilobular emphysema, peribronchovascular nodularity, mild BTX, LLL consolidation CT chest 03/02/18 >> mild emphysema, areas of BTX, several nodular areas up to 1.4 cm CT chest 08/25/18 >> centrilobular emphysema, BTX with wall thickening and tree in bud RML/lingula/bilateral lower lobes, unchanged opacity LLL, new patchy opacities LUL  HRCT chest 09/21/19 >> apical scarring, diffuse BTX with peribronchovascular nodularity, areas of mucoid impaction, coronary calcification  Echo11/23/20 >> EF 60 to 65%, mild LVH, grade 2 DD, mild MR, mild TR  Assessment and Plan: Acute bronchitis/bronchiectatic flare- Due to her acute respiratory symptoms will check COVID-19 test.  Have asked her to postpone her Covid vaccine #2 for 1 week until she feels better.  Plan  Patient Instructions  Augmentin 875mg  Twice daily  For 1 week. ,take with food.  Robitussin DM As needed  Cough  Tessalon Three times a day  As needed  Cough .  COVID test as discussed.  Push Covid vaccine to next week.  Follow up with Dr. Halford Chessman  In 6 weeks and As needed   Please contact office for sooner follow up if symptoms do not improve or worsen or seek emergency care         Follow Up Instructions: Follow up with Dr. Halford Chessman  In 6 weeks and As needed   Please contact office for sooner follow up if symptoms do not improve or worsen or seek emergency care     I discussed the assessment and treatment plan with the patient. The patient was provided an opportunity to ask questions and all were answered. The patient agreed with the plan and demonstrated an understanding of the instructions.   The patient was advised to call back or seek an in-person evaluation if the symptoms worsen or if the  condition fails to improve as anticipated.  I provided  22 minutes of non-face-to-face time during this encounter.   Rexene Edison, NP

## 2020-01-28 NOTE — Telephone Encounter (Signed)
Sorry to hear this. If this is mostly a breathing issue she should contact her PCP which may be more appropriate if she has any openings. She does have severe reflux and planning on TIF when she is ready for this in upcoming months, but not sure if she aspirated overnight or something else. She probably needs a CXR initially. Can you touch base with her and let her know to contact PCP. If she can't get in there we can get a CXR done for her today. Thanks

## 2020-01-28 NOTE — Telephone Encounter (Signed)
Called patient back and let her know Dr. Havery Moros would like her to call her PCP and ask them to order a CXR to make sure she didn't aspirate overnight. Let her know if they are not able to order a CXR, to call us back and we can order one

## 2020-01-28 NOTE — Telephone Encounter (Signed)
Called patient back and she states she had a coughing episode last night that lasted about 3 hours. That it is deep and painful and every time she takes a deep breath it starts up again. She is requesting to see someone in the office. She had an office visit on 01/18/20. Please advise

## 2020-01-28 NOTE — Progress Notes (Signed)
Reviewed and agree with assessment/plan.   Odas Ozer, MD [facility] 11/10/2016, 12:24 PM Pager:  [phone]  

## 2020-01-28 NOTE — Telephone Encounter (Signed)
Pt is calling regarding this msg she would like to be seen today sometime. She states cough is deep and painful. Please advise

## 2020-01-28 NOTE — Telephone Encounter (Signed)
Pt stated that she has been coughing 3-4 h during the night and that it affects her speech.  She is scheduled for the second covid vaccine tomorrow and does not want to miss it.  Pt requested a call back.

## 2020-02-05 DIAGNOSIS — Z882 Allergy status to sulfonamides status: Secondary | ICD-10-CM | POA: Diagnosis not present

## 2020-02-05 DIAGNOSIS — G608 Other hereditary and idiopathic neuropathies: Secondary | ICD-10-CM | POA: Diagnosis not present

## 2020-02-05 DIAGNOSIS — G629 Polyneuropathy, unspecified: Secondary | ICD-10-CM | POA: Diagnosis not present

## 2020-02-05 DIAGNOSIS — J84116 Cryptogenic organizing pneumonia: Secondary | ICD-10-CM | POA: Diagnosis not present

## 2020-02-05 DIAGNOSIS — Z8701 Personal history of pneumonia (recurrent): Secondary | ICD-10-CM | POA: Diagnosis not present

## 2020-02-05 DIAGNOSIS — Z8739 Personal history of other diseases of the musculoskeletal system and connective tissue: Secondary | ICD-10-CM | POA: Diagnosis not present

## 2020-02-05 DIAGNOSIS — Z888 Allergy status to other drugs, medicaments and biological substances status: Secondary | ICD-10-CM | POA: Diagnosis not present

## 2020-02-05 DIAGNOSIS — I7300 Raynaud's syndrome without gangrene: Secondary | ICD-10-CM | POA: Diagnosis not present

## 2020-02-06 ENCOUNTER — Ambulatory Visit: Payer: Medicare HMO | Attending: Internal Medicine

## 2020-02-06 DIAGNOSIS — Z23 Encounter for immunization: Secondary | ICD-10-CM

## 2020-02-11 ENCOUNTER — Ambulatory Visit (INDEPENDENT_AMBULATORY_CARE_PROVIDER_SITE_OTHER): Payer: Medicare HMO | Admitting: Neurology

## 2020-02-11 ENCOUNTER — Other Ambulatory Visit: Payer: Self-pay

## 2020-02-11 ENCOUNTER — Encounter: Payer: Self-pay | Admitting: Neurology

## 2020-02-11 VITALS — BP 156/90 | HR 68 | Ht 64.0 in | Wt 85.0 lb

## 2020-02-11 DIAGNOSIS — G629 Polyneuropathy, unspecified: Secondary | ICD-10-CM | POA: Diagnosis not present

## 2020-02-11 MED ORDER — GABAPENTIN 250 MG/5ML PO SOLN: ORAL | 3 refills | Status: DC

## 2020-02-11 NOTE — Progress Notes (Signed)
NEUROLOGY FOLLOW UP OFFICE NOTE  [name] [id number]  DOB: 1941-10-09  HISTORY OF PRESENT ILLNESS: I had the pleasure of seeing [name] in follow-up in the neurology clinic on 02/11/2020.  The patient was last seen 7 months ago for neuropathy. Since her last visit, she obtained a second opinion from Dr. Idalia Needle at [facility], notes reviewed, diagnosis of mild, small fiber neuropathy, likely multifactorial and exacerbated in the setting of Raynaud's syndrome. Skin biopsy was done, which was normal. She was advised to try capsaicin cream or lidoderm patches, continue gabapentin. She was referred to Rheumatology and recently evaluated, notes indicate with no specific features to diagnose vasculitis, and discussion with Neurology. She expresses frustration with all the testing. The other night the burning and stinging was so bad in her legs. She tried the capsaicin cream which caused more burning, making things worse. She feels the compounded cream prescribed by her podiatrist helped better. She has gabapentin liquid formulation and only takes a little drop of it. She continues to remain active, walking and riding her bike. She denies any falls.   HPI: This is a pleasant 79 yo RH woman with a history of GERD, nutcracker esophagus, pulmonary fibrosis, in her usual state of health until early June 2017 while walking into a grocery she suddenly had left temporal pain, then it felt like she was hit on both side of the head with a hammer. She reports she was "hit like gangbusters," she was very dizzy with a spinning sensation, nauseated. She held on to the cart for balance and had difficulty concentrating. Her vision was blurred. She managed to finish her chore and walked to the car, then noticed that she was having a hard time talking. She could see the sentence broken up in her head and could not get the words out. She was able to drive home with no focal symptoms noted. The episode lasted 30 minutes or  so, she felt weak and tired after. Since then she has had at least 2 or 3 more episodes of headaches with dizziness, which did not progress to speech difficulties. The symptoms would last around 5 minutes, no clear positional component to the dizziness. When she feels it coming on, she massages her head, which seems to help. She was unsure if Tylenol helped. She denies any prior history of headaches. She denies any vision loss, photo/phonophobia, diplopia, dysarthria, jaw claudication, hearing loss. She has occasional pulsatile tinnitus more in the afternoons. She has GERD which makes swallowing difficult. She has a history of neuropathy affecting both feet, R>L, with burning and numbness/tingling for several years. She has been doing laser therapy for venous insufficiency/varicose veins. She denies any falls but trips more. Sleep varies. She is very active and rides her bike for a couple of hours daily. She works at a preschool. Her mother had a stroke at age 26.   Diagnostic Data:  Venous dopplers showed chronic venous insufficiency, duplex showed limited valvular insufficiency.    EMG/NCV of the right UE and LE confirmed moderate chronic sensorimotor axonal polyneuropathy. There was also note of moderate right ulnar neuropathy with slowing across the elbow and mild right median neuropathy consistent with carpal tunnel syndrome.    PAST MEDICAL HISTORY: Past Medical History:  Diagnosis Date  . Allergy   . Arthritis   . BOOP (bronchiolitis obliterans with organizing pneumonia) ([place])   . Bronchiectasis   . Cataract   . Esophageal dysmotility   . Gastroparesis   .  GERD (gastroesophageal reflux disease)    severe  . Iron deficiency anemia   . Neuropathy   . Pneumonia   . Raynaud's disease   . Right lower lobe pneumonia December 2013    MEDICATIONS:  Outpatient Encounter Medications as of 02/11/2020  Medication Sig  . Acetaminophen (TYLENOL EXTRA STRENGTH PO) Take 350 mg by mouth as needed.    . Alum Hydroxide-Mag Carbonate (GAVISCON EXTRA STRENGTH) 160-105 MG CHEW Use as directed  . benzonatate (TESSALON) 200 MG capsule Take 1 capsule (200 mg total) by mouth as needed for cough.  . capsaicin (ZOSTRIX) 0.025 % cream Apply topically.  [name] dexlansoprazole (DEXILANT) 60 MG capsule Take 1 capsule (60 mg total) by mouth daily. Please schedule a yearly office visit for further refills.  . fexofenadine (ALLEGRA) 180 MG tablet Take 180 mg by mouth daily.  . folic acid (FOLVITE) 1 MG tablet Take 1 tablet (1 mg total) by mouth daily.  [name] gabapentin (NEURONTIN) 250 MG/5ML solution TAKE 2 ML AT BEDTIME.  . NONFORMULARY OR COMPOUNDED ITEM Peripheral Neuropathy Cream: Bupivacaine 1%, Doxepin 3%, Gabapentin 6%, Pentoxifylline 3%, Topiramate 1% [facility]  . spironolactone (ALDACTONE) 25 MG tablet Take 0.5 tablets (12.5 mg total) by mouth daily.  [name] triamcinolone (KENALOG) 0.025 % cream   . TURMERIC PO Take 1 tablet by mouth as needed.   No facility-administered encounter medications on file as of 02/11/2020.    ALLERGIES: Allergies  Allergen Reactions  . Cortisone     unsure  . Prednisone   . Sulfasalazine Other (See Comments)    Unsure of reaction  . Sulfonamide Derivatives     Unsure of reaction    FAMILY HISTORY: Family History  Problem Relation Age of Onset  . Lymphoma Mother   . Heart disease Father   . Lung cancer Paternal Uncle   . Heart disease Paternal Grandfather   . Leukemia Paternal Grandmother   . Cancer Maternal Grandmother        unknown type  . Colon cancer Neg Hx   . Esophageal cancer Neg Hx   . Rectal cancer Neg Hx   . Stomach cancer Neg Hx     SOCIAL HISTORY: Social History   Socioeconomic History  . Marital status: Widowed    Spouse name: Not on file  . Number of children: 2  . Years of education: Not on file  . Highest education level: Not on file  Occupational History  . Occupation: retired Pharmacist, hospital    Comment: still  teaching [employer]   Tobacco Use  . Smoking status: Former Smoker    Types: Cigarettes    Quit date: 11/15/1968    Years since quitting: 51.2  . Smokeless tobacco: Never Used  . Tobacco comment: 3 cigs a day  Substance and Sexual Activity  . Alcohol use: No  . Drug use: No  . Sexual activity: Not Currently    Partners: Male  Other Topics Concern  . Not on file  Social History Narrative   Still working as a Print production planner at [employer].       Widowed      Lives alone   Social Determinants of Health   Financial Resource Strain:   . Difficulty of Paying Living Expenses:   Food Insecurity:   . Worried About Charity fundraiser in the Last Year:   . Arboriculturist in the Last Year:   Transportation Needs:   . Film/video editor (Medical):   [name]  Lack of Transportation (Non-Medical):   Physical Activity:   . Days of Exercise per Week:   . Minutes of Exercise per Session:   Stress:   . Feeling of Stress :   Social Connections:   . Frequency of Communication with Friends and Family:   . Frequency of Social Gatherings with Friends and Family:   . Attends Religious Services:   . Active Member of Clubs or Organizations:   . Attends Archivist Meetings:   [name] Marital Status:   Intimate Partner Violence:   . Fear of Current or Ex-Partner:   . Emotionally Abused:   [name] Physically Abused:   . Sexually Abused:     REVIEW OF SYSTEMS: Constitutional: No fevers, chills, or sweats, no generalized fatigue, change in appetite Eyes: No visual changes, double vision, eye pain Ear, nose and throat: No hearing loss, ear pain, nasal congestion, sore throat Cardiovascular: No chest pain, palpitations Respiratory:  No shortness of breath at rest or with exertion, wheezes GastrointestinaI: No nausea, vomiting, diarrhea, abdominal pain, fecal incontinence Genitourinary:  No dysuria, urinary retention or frequency Musculoskeletal:  + neck pain, back pain Integumentary: No rash,  pruritus, skin lesions Neurological: as above Psychiatric: No depression, insomnia, anxiety Endocrine: No palpitations, fatigue, diaphoresis, mood swings, change in appetite, change in weight, increased thirst Hematologic/Lymphatic:  No anemia, purpura, petechiae. Allergic/Immunologic: no itchy/runny eyes, nasal congestion, recent allergic reactions, rashes  PHYSICAL EXAM: Vitals:   02/11/20 1511  BP: (!) 156/90  Pulse: 68  SpO2: 97%   General: No acute distress Skin/Extremities: No rash, no edema, +varicose veins, less discoloration on both feet today, L>R Neurological Exam: alert and oriented to person, place, and time. No aphasia or dysarthria. Fund of knowledge is appropriate.  Recent and remote memory are intact.  Attention and concentration are normal.  Cranial nerves: Pupils equal, round, reactive to light. Extraocular movements intact with no nystagmus. Visual fields full. No facial asymmetry. Motor: Bulk and tone normal, muscle strength 5/5 throughout. Sensation decreased on both feet. Deep tendon reflexes +1 throughout, toes downgoing.  Finger to nose testing intact. Gait slow and cautious   IMPRESSION: This is a pleasant 79 yo RH woman with a history of GERD, nutcracker esophagus, pulmonary fibrosis, initially seen for transient episode of headache, vertigo, and expressive aphasia, MRI brain no acute changes. Her MRA head and neck was unremarkable except for moderate stenosis of the left P1 and diminished distal flow-related signal within the left PCA distribution. No further episodes since June 2017, continue daily aspirin and control of vascular risk factors. She has mostly been followed in the clinic for neuropathy, she had a second opinion at [facility] with diagnosis of small fiber neuropathy, likely multifactorial, exacerbated by Raynaud's syndrome. Skin biopsy normal. We again discussed the diagnosis of neuropathy, she is awaiting word from the Rheumatologist. She declines  increasing gabapentin dose. The compounded cream helps better, continue supportive care. We discussed balance therapy, she would like to hold off for now. Follow-up in 6 months, she knows to call for any changes.   Thank you for allowing me to participate in her care.  Please do not hesitate to call for any questions or concerns.   Ellouise Newer, M.D.   CC: Dr. Felipa Eth

## 2020-02-11 NOTE — Patient Instructions (Signed)
Great seeing you! Continue all your medications. Follow-up in 6 months, call for any changes. Let me know if you would like to proceed with Balance Therapy.

## 2020-02-17 DIAGNOSIS — R208 Other disturbances of skin sensation: Secondary | ICD-10-CM | POA: Insufficient documentation

## 2020-02-23 ENCOUNTER — Encounter: Payer: Self-pay | Admitting: *Deleted

## 2020-02-28 DIAGNOSIS — G609 Hereditary and idiopathic neuropathy, unspecified: Secondary | ICD-10-CM | POA: Diagnosis not present

## 2020-02-28 DIAGNOSIS — G5622 Lesion of ulnar nerve, left upper limb: Secondary | ICD-10-CM | POA: Diagnosis not present

## 2020-03-04 DIAGNOSIS — Z961 Presence of intraocular lens: Secondary | ICD-10-CM | POA: Diagnosis not present

## 2020-03-04 DIAGNOSIS — H35372 Puckering of macula, left eye: Secondary | ICD-10-CM | POA: Diagnosis not present

## 2020-03-04 DIAGNOSIS — H52203 Unspecified astigmatism, bilateral: Secondary | ICD-10-CM | POA: Diagnosis not present

## 2020-03-07 DIAGNOSIS — Z8701 Personal history of pneumonia (recurrent): Secondary | ICD-10-CM | POA: Diagnosis not present

## 2020-03-07 DIAGNOSIS — Z8709 Personal history of other diseases of the respiratory system: Secondary | ICD-10-CM | POA: Diagnosis not present

## 2020-03-07 DIAGNOSIS — I7300 Raynaud's syndrome without gangrene: Secondary | ICD-10-CM | POA: Diagnosis not present

## 2020-03-07 DIAGNOSIS — G629 Polyneuropathy, unspecified: Secondary | ICD-10-CM | POA: Diagnosis not present

## 2020-03-07 DIAGNOSIS — Z8739 Personal history of other diseases of the musculoskeletal system and connective tissue: Secondary | ICD-10-CM | POA: Diagnosis not present

## 2020-03-10 ENCOUNTER — Ambulatory Visit (INDEPENDENT_AMBULATORY_CARE_PROVIDER_SITE_OTHER): Payer: Medicare HMO | Admitting: Podiatry

## 2020-03-10 ENCOUNTER — Other Ambulatory Visit: Payer: Self-pay

## 2020-03-10 ENCOUNTER — Encounter: Payer: Self-pay | Admitting: Podiatry

## 2020-03-10 DIAGNOSIS — M79675 Pain in left toe(s): Secondary | ICD-10-CM | POA: Diagnosis not present

## 2020-03-10 DIAGNOSIS — G629 Polyneuropathy, unspecified: Secondary | ICD-10-CM

## 2020-03-10 DIAGNOSIS — B351 Tinea unguium: Secondary | ICD-10-CM | POA: Diagnosis not present

## 2020-03-10 DIAGNOSIS — L84 Corns and callosities: Secondary | ICD-10-CM | POA: Diagnosis not present

## 2020-03-10 DIAGNOSIS — M79674 Pain in right toe(s): Secondary | ICD-10-CM

## 2020-03-10 NOTE — Patient Instructions (Signed)
Corns and Calluses Corns are small areas of thickened skin that occur on the top, sides, or tip of a toe. They contain a cone-shaped core with a point that can press on a nerve below. This causes pain.  Calluses are areas of thickened skin that can occur anywhere on the body, including the hands, fingers, palms, soles of the feet, and heels. Calluses are usually larger than corns. What are the causes? Corns and calluses are caused by rubbing (friction) or pressure, such as from shoes that are too tight or do not fit properly. What increases the risk? Corns are more likely to develop in people who have misshapen toes (toe deformities), such as hammer toes. Calluses can occur with friction to any area of the skin. They are more likely to develop in people who:  Work with their hands.  Wear shoes that fit poorly, are too tight, or are high-heeled.  Have toe deformities. What are the signs or symptoms? Symptoms of a corn or callus include:  A hard growth on the skin.  Pain or tenderness under the skin.  Redness and swelling.  Increased discomfort while wearing tight-fitting shoes, if your feet are affected. If a corn or callus becomes infected, symptoms may include:  Redness and swelling that gets worse.  Pain.  Fluid, blood, or pus draining from the corn or callus. How is this diagnosed? Corns and calluses may be diagnosed based on your symptoms, your medical history, and a physical exam. How is this treated? Treatment for corns and calluses may include:  Removing the cause of the friction or pressure. This may involve: ? Changing your shoes. ? Wearing shoe inserts (orthotics) or other protective layers in your shoes, such as a corn pad. ? Wearing gloves.  Applying medicine to the skin (topical medicine) to help soften skin in the hardened, thickened areas.  Removing layers of dead skin with a file to reduce the size of the corn or callus.  Removing the corn or callus with a  scalpel or laser.  Taking antibiotic medicines, if your corn or callus is infected.  Having surgery, if a toe deformity is the cause. Follow these instructions at home:   Take over-the-counter and prescription medicines only as told by your health care provider.  If you were prescribed an antibiotic, take it as told by your health care provider. Do not stop taking it even if your condition starts to improve.  Wear shoes that fit well. Avoid wearing high-heeled shoes and shoes that are too tight or too loose.  Wear any padding, protective layers, gloves, or orthotics as told by your health care provider.  Soak your hands or feet and then use a file or pumice stone to soften your corn or callus. Do this as told by your health care provider.  Check your corn or callus every day for symptoms of infection. Contact a health care provider if you:  Notice that your symptoms do not improve with treatment.  Have redness or swelling that gets worse.  Notice that your corn or callus becomes painful.  Have fluid, blood, or pus coming from your corn or callus.  Have new symptoms. Summary  Corns are small areas of thickened skin that occur on the top, sides, or tip of a toe.  Calluses are areas of thickened skin that can occur anywhere on the body, including the hands, fingers, palms, and soles of the feet. Calluses are usually larger than corns.  Corns and calluses are caused by   rubbing (friction) or pressure, such as from shoes that are too tight or do not fit properly.  Treatment may include wearing any padding, protective layers, gloves, or orthotics as told by your health care provider. This information is not intended to replace advice given to you by your health care provider. Make sure you discuss any questions you have with your health care provider. Document Revised: 02/21/2019 Document Reviewed: 09/14/2017 Elsevier Patient Education  2020 Elsevier Inc.  Onychomycosis/Fungal  Toenails  WHAT IS IT? An infection that lies within the keratin of your nail plate that is caused by a fungus.  WHY ME? Fungal infections affect all ages, sexes, races, and creeds.  There may be many factors that predispose you to a fungal infection such as age, coexisting medical conditions such as diabetes, or an autoimmune disease; stress, medications, fatigue, genetics, etc.  Bottom line: fungus thrives in a warm, moist environment and your shoes offer such a location.  IS IT CONTAGIOUS? Theoretically, yes.  You do not want to share shoes, nail clippers or files with someone who has fungal toenails.  Walking around barefoot in the same room or sleeping in the same bed is unlikely to transfer the organism.  It is important to realize, however, that fungus can spread easily from one nail to the next on the same foot.  HOW DO WE TREAT THIS?  There are several ways to treat this condition.  Treatment may depend on many factors such as age, medications, pregnancy, liver and kidney conditions, etc.  It is best to ask your doctor which options are available to you.  4. No treatment.   Unlike many other medical concerns, you can live with this condition.  However for many people this can be a painful condition and may lead to ingrown toenails or a bacterial infection.  It is recommended that you keep the nails cut short to help reduce the amount of fungal nail. 5. Topical treatment.  These range from herbal remedies to prescription strength nail lacquers.  About 40-50% effective, topicals require twice daily application for approximately 9 to 12 months or until an entirely new nail has grown out.  The most effective topicals are medical grade medications available through physicians offices. 6. Oral antifungal medications.  With an 80-90% cure rate, the most common oral medication requires 3 to 4 months of therapy and stays in your system for a year as the new nail grows out.  Oral antifungal medications do  require blood work to make sure it is a safe drug for you.  A liver function panel will be performed prior to starting the medication and after the first month of treatment.  It is important to have the blood work performed to avoid any harmful side effects.  In general, this medication safe but blood work is required. 7. Laser Therapy.  This treatment is performed by applying a specialized laser to the affected nail plate.  This therapy is noninvasive, fast, and non-painful.  It is not covered by insurance and is therefore, out of pocket.  The results have been very good with a 80-95% cure rate.  The [facility] is the only practice in the area to offer this therapy. 8. Permanent Nail Avulsion.  Removing the entire nail so that a new nail will not grow back. 

## 2020-03-14 NOTE — Progress Notes (Signed)
Subjective: [name] presents today for follow up of at risk foot care with history of peripheral neuropathy and corn(s) left 3rd digit and painful mycotic toenails b/l that are difficult to trim. Pain interferes with ambulation. Aggravating factors include wearing enclosed shoe gear. Pain is relieved with periodic professional debridement.   She voices no new pedal problems on today's visit.  Allergies  Allergen Reactions  . Cortisone     unsure  . Prednisone   . Sulfasalazine Other (See Comments)    Unsure of reaction  . Sulfonamide Derivatives     Unsure of reaction     Objective: There were no vitals filed for this visit.  Pt is a pleasant 79 y.o. year old Caucasian female , WD, WN in NAD. AAO x 3.   Vascular Examination:  Capillary refill time to digits immediate b/l. Palpable DP pulses b/l. Palpable PT pulses b/l. Pedal hair sparse b/l. Skin temperature gradient within normal limits b/l.  Dermatological Examination: Pedal skin is thin shiny, atrophic bilaterally. No open wounds bilaterally. No interdigital macerations bilaterally. Toenails 1-5 b/l elongated, dystrophic, thickened, crumbly with subungual debris and tenderness to dorsal palpation. Hyperkeratotic lesion(s) L 3rd toe.  No erythema, no edema, no drainage, no flocculence.  Musculoskeletal: Normal muscle strength 5/5 to all lower extremity muscle groups bilaterally. No pain crepitus or joint limitation noted with ROM b/l. Hammertoes noted to the 2-5 bilaterally.  Neurological: Protective sensation decreased with 10 gram monofilament b/l. Vibratory sensation intact b/l. Proprioception intact bilaterally.  Assessment: 1. Pain due to onychomycosis of toenails of both feet   2. Corns   3. Peripheral polyneuropathy    Plan: -Toenails 1-5 b/l were debrided in length and girth with sterile nail nippers and dremel without iatrogenic bleeding.  -Corn(s) L 3rd toe pared utilizing sterile scalpel blade without  complication or incident. Total number debrided=1. -Patient to continue soft, supportive shoe gear daily. -Patient to report any pedal injuries to medical professional immediately. -Patient/POA to call should there be question/concern in the interim.  Return in about 3 months (around 06/09/2020) for nail and callus trim.

## 2020-03-21 ENCOUNTER — Ambulatory Visit: Payer: Medicare HMO | Admitting: Pulmonary Disease

## 2020-03-26 DIAGNOSIS — Z809 Family history of malignant neoplasm, unspecified: Secondary | ICD-10-CM | POA: Diagnosis not present

## 2020-03-26 DIAGNOSIS — Z008 Encounter for other general examination: Secondary | ICD-10-CM | POA: Diagnosis not present

## 2020-03-26 DIAGNOSIS — Z803 Family history of malignant neoplasm of breast: Secondary | ICD-10-CM | POA: Diagnosis not present

## 2020-03-26 DIAGNOSIS — Z7982 Long term (current) use of aspirin: Secondary | ICD-10-CM | POA: Diagnosis not present

## 2020-03-26 DIAGNOSIS — J439 Emphysema, unspecified: Secondary | ICD-10-CM | POA: Diagnosis not present

## 2020-03-26 DIAGNOSIS — K219 Gastro-esophageal reflux disease without esophagitis: Secondary | ICD-10-CM | POA: Diagnosis not present

## 2020-03-26 DIAGNOSIS — G629 Polyneuropathy, unspecified: Secondary | ICD-10-CM | POA: Diagnosis not present

## 2020-03-26 DIAGNOSIS — R636 Underweight: Secondary | ICD-10-CM | POA: Diagnosis not present

## 2020-03-26 DIAGNOSIS — M1990 Unspecified osteoarthritis, unspecified site: Secondary | ICD-10-CM | POA: Diagnosis not present

## 2020-03-26 DIAGNOSIS — R030 Elevated blood-pressure reading, without diagnosis of hypertension: Secondary | ICD-10-CM | POA: Diagnosis not present

## 2020-03-26 DIAGNOSIS — Z681 Body mass index (BMI) 19 or less, adult: Secondary | ICD-10-CM | POA: Diagnosis not present

## 2020-04-01 ENCOUNTER — Encounter (INDEPENDENT_AMBULATORY_CARE_PROVIDER_SITE_OTHER): Payer: Self-pay | Admitting: Ophthalmology

## 2020-04-01 ENCOUNTER — Other Ambulatory Visit: Payer: Self-pay

## 2020-04-01 ENCOUNTER — Ambulatory Visit (INDEPENDENT_AMBULATORY_CARE_PROVIDER_SITE_OTHER): Payer: Medicare HMO | Admitting: Ophthalmology

## 2020-04-01 DIAGNOSIS — H35372 Puckering of macula, left eye: Secondary | ICD-10-CM | POA: Diagnosis not present

## 2020-04-01 DIAGNOSIS — H43812 Vitreous degeneration, left eye: Secondary | ICD-10-CM

## 2020-04-01 DIAGNOSIS — H43811 Vitreous degeneration, right eye: Secondary | ICD-10-CM

## 2020-04-01 DIAGNOSIS — H43813 Vitreous degeneration, bilateral: Secondary | ICD-10-CM | POA: Diagnosis not present

## 2020-04-01 NOTE — Progress Notes (Signed)
04/01/2020     CHIEF COMPLAINT Patient presents for Retina Follow Up   HISTORY OF PRESENT ILLNESS: [name] is a 79 y.o. female who presents to the clinic today for:   HPI    Retina Follow Up    Patient presents with  Other.  In both eyes.  Duration of 7 months.  Since onset it is stable.          Comments    7 month follow up - OCT OU Patient denies change in vision and overall has no complaints.        Last edited by Gerda Diss on 04/01/2020 10:59 AM. (History)      Referring physician: [name], MD [address],  [place] [address]  HISTORICAL INFORMATION:   Selected notes from the [place]: No current outpatient medications on file. (Ophthalmic Drugs)   No current facility-administered medications for this visit. (Ophthalmic Drugs)   Current Outpatient Medications (Other)  Medication Sig  . Acetaminophen (TYLENOL EXTRA STRENGTH PO) Take 350 mg by mouth as needed.   . Alum Hydroxide-Mag Carbonate (GAVISCON EXTRA STRENGTH) 160-105 MG CHEW Use as directed  . benzonatate (TESSALON) 200 MG capsule Take 1 capsule (200 mg total) by mouth as needed for cough.  . capsaicin (ZOSTRIX) 0.025 % cream Apply topically.  [name] dexlansoprazole (DEXILANT) 60 MG capsule Take 1 capsule (60 mg total) by mouth daily. Please schedule a yearly office visit for further refills.  . fexofenadine (ALLEGRA) 180 MG tablet Take 180 mg by mouth daily.  . folic acid (FOLVITE) 1 MG tablet Take 1 tablet (1 mg total) by mouth daily.  [name] gabapentin (NEURONTIN) 250 MG/5ML solution TAKE 2 ML AT BEDTIME.  . NONFORMULARY OR COMPOUNDED ITEM Peripheral Neuropathy Cream: Bupivacaine 1%, Doxepin 3%, Gabapentin 6%, Pentoxifylline 3%, Topiramate 1% Order faxed to [facility]  . pseudoephedrine-guaifenesin (MUCINEX D) 60-600 MG 12 hr tablet Take by mouth.  . spironolactone (ALDACTONE) 25 MG tablet Take 0.5 tablets (12.5 mg total)  by mouth daily. (Patient not taking: Reported on 02/11/2020)  . triamcinolone (KENALOG) 0.025 % cream   . TURMERIC PO Take 1 tablet by mouth as needed.   No current facility-administered medications for this visit. (Other)      REVIEW OF SYSTEMS:    ALLERGIES Allergies  Allergen Reactions  . Cortisone     unsure  . Prednisone   . Sulfasalazine Other (See Comments)    Unsure of reaction  . Sulfonamide Derivatives     Unsure of reaction    PAST MEDICAL HISTORY Past Medical History:  Diagnosis Date  . Allergy   . Arthritis   . BOOP (bronchiolitis obliterans with organizing pneumonia) ([place])   . Bronchiectasis   . Cataract   . Esophageal dysmotility   . Gastroparesis   . GERD (gastroesophageal reflux disease)    severe  . Iron deficiency anemia   . Neuropathy   . Pneumonia   . Raynaud's disease   . Right lower lobe pneumonia December 2013   Past Surgical History:  Procedure Laterality Date  . [place] STUDY N/A 10/13/2015   Procedure: [place] STUDY;  Surgeon: Gatha Mayer, MD;  Location: [facility];  Service: Endoscopy;  Laterality: N/A;  . BREAST LUMPECTOMY     left, x 2  . CATARACT EXTRACTION     both eyes  . CHOLECYSTECTOMY  2005  . COLONOSCOPY    .  ESOPHAGEAL MANOMETRY N/A 10/06/2015   Procedure: ESOPHAGEAL MANOMETRY (EM);  Surgeon: Manus Gunning, MD;  Location: [facility];  Service: Gastroenterology;  Laterality: N/A;  . ESOPHAGEAL MANOMETRY N/A 03/10/2018   Procedure: ESOPHAGEAL MANOMETRY (EM);  Surgeon: Mauri Pole, MD;  Location: [facility];  Service: Endoscopy;  Laterality: N/A;  . LUNG BIOPSY     vats right 2003  . TOTAL ABDOMINAL HYSTERECTOMY    . UPPER GASTROINTESTINAL ENDOSCOPY      FAMILY HISTORY Family History  Problem Relation Age of Onset  . Lymphoma Mother   . Heart disease Father   . Lung cancer Paternal Uncle   . Heart disease Paternal Grandfather   . Leukemia Paternal Grandmother   . Cancer Maternal  Grandmother        unknown type  . Colon cancer Neg Hx   . Esophageal cancer Neg Hx   . Rectal cancer Neg Hx   . Stomach cancer Neg Hx     SOCIAL HISTORY Social History   Tobacco Use  . Smoking status: Former Smoker    Types: Cigarettes    Quit date: 11/15/1968    Years since quitting: 51.4  . Smokeless tobacco: Never Used  . Tobacco comment: 3 cigs a day  Substance Use Topics  . Alcohol use: No  . Drug use: No         OPHTHALMIC EXAM:  Base Eye Exam    Visual Acuity (Snellen - Linear)      Right Left   Dist cc 20/20-1 20/25-1   Correction: Glasses       Tonometry (Tonopen, 11:06 AM)      Right Left   Pressure 14 14       Pupils      Pupils Dark Light Shape React APD   Right PERRL 4 3 Round Brisk None   Left PERRL 4 3 Round Brisk None       Visual Fields (Counting fingers)      Left Right    Full Full       Extraocular Movement      Right Left    Full Full       Neuro/Psych    Oriented x3: Yes   Mood/Affect: Normal       Dilation    Both eyes: 1.0% Mydriacyl, 2.5% Phenylephrine @ 11:06 AM        Slit Lamp and Fundus Exam    External Exam      Right Left   External Normal Normal       Slit Lamp Exam      Right Left   Lids/Lashes Normal Normal   Conjunctiva/Sclera White and quiet White and quiet   Cornea Clear Clear   Anterior Chamber Deep and quiet Deep and quiet   Iris Round and reactive Round and reactive   Lens Posterior chamber intraocular lens Posterior chamber intraocular lens   Anterior Vitreous Normal Normal       Fundus Exam      Right Left   Posterior Vitreous Posterior vitreous detachment Posterior vitreous detachment   Disc Normal Normal   C/D Ratio 0.3 0.25   Macula Normal Epiretinal membrane moderate topographic distortion, Choroidal nevus 1Da   Vessels Normal Normal   Periphery Normal Normal          IMAGING AND PROCEDURES  Imaging and Procedures for 04/01/20  OCT, Retina - OU - Both Eyes       Right  Eye Quality was good.  Scan locations included subfoveal. Central Foveal Thickness: 296. Progression has been stable. Findings include normal observations.   Left Eye Quality was good. Scan locations included subfoveal. Central Foveal Thickness: 373. Progression has been stable. Findings include epiretinal membrane.                 ASSESSMENT/PLAN:  No problem-specific Assessment & Plan notes found for this encounter.      ICD-10-CM   1. Left epiretinal membrane  H35.372 OCT, Retina - OU - Both Eyes  2. Posterior vitreous detachment of left eye  H43.812 OCT, Retina - OU - Both Eyes  3. Posterior vitreous detachment of right eye  H43.811 OCT, Retina - OU - Both Eyes  4. Posterior vitreous detachment of both eyes  H43.813     1.  Patient continues to home monitor with reading glasses on the monocular fashion the visual acuity left eye.  No substantial changes develop in her reading vision the left eye she will let us know.  2.  Periodic examinations perhaps every 9 months would be appropriate now since we have proven this is a stable condition in her case.  3.  Ophthalmic Meds Ordered this visit:  No orders of the defined types were placed in this encounter.      Return in about 9 months (around 01/02/2021) for DILATE OU, OCT.  There are no Patient Instructions on file for this visit.   Explained the diagnoses, plan, and follow up with the patient and they expressed understanding.  Patient expressed understanding of the importance of proper follow up care.   Clent Demark Huyen Perazzo M.D. Diseases & Surgery of the Retina and Vitreous [facility]ina [facility] [place] 04/01/20     Abbreviations: M myopia (nearsighted); A astigmatism; H hyperopia (farsighted); P presbyopia; Mrx spectacle prescription;  CTL contact lenses; OD right eye; OS left eye; OU both eyes  XT exotropia; ET esotropia; PEK punctate epithelial keratitis; PEE punctate epithelial erosions; DES dry eye syndrome; MGD  meibomian gland dysfunction; ATs artificial tears; PFAT's preservative free artificial tears; [place] nuclear sclerotic cataract; PSC posterior subcapsular cataract; ERM epi-retinal membrane; PVD posterior vitreous detachment; RD retinal detachment; DM diabetes mellitus; DR diabetic retinopathy; NPDR non-proliferative diabetic retinopathy; PDR proliferative diabetic retinopathy; CSME clinically significant macular edema; DME diabetic macular edema; dbh dot blot hemorrhages; CWS cotton wool spot; POAG primary open angle glaucoma; C/D cup-to-disc ratio; HVF humphrey visual field; GVF goldmann visual field; OCT optical coherence tomography; IOP intraocular pressure; BRVO Branch retinal vein occlusion; CRVO central retinal vein occlusion; CRAO central retinal artery occlusion; BRAO branch retinal artery occlusion; RT retinal tear; SB scleral buckle; PPV pars plana vitrectomy; VH Vitreous hemorrhage; PRP panretinal laser photocoagulation; IVK intravitreal kenalog; VMT vitreomacular traction; MH Macular hole;  NVD neovascularization of the disc; NVE neovascularization elsewhere; AREDS age related eye disease study; ARMD age related macular degeneration; POAG primary open angle glaucoma; EBMD epithelial/anterior basement membrane dystrophy; ACIOL anterior chamber intraocular lens; IOL intraocular lens; PCIOL posterior chamber intraocular lens; Phaco/IOL phacoemulsification with intraocular lens placement; [place] photorefractive keratectomy; LASIK laser assisted in situ keratomileusis; HTN hypertension; DM diabetes mellitus; COPD chronic obstructive pulmonary disease

## 2020-04-04 ENCOUNTER — Encounter: Payer: Self-pay | Admitting: Pulmonary Disease

## 2020-04-04 ENCOUNTER — Ambulatory Visit (INDEPENDENT_AMBULATORY_CARE_PROVIDER_SITE_OTHER): Payer: Medicare HMO | Admitting: Pulmonary Disease

## 2020-04-04 ENCOUNTER — Other Ambulatory Visit: Payer: Self-pay

## 2020-04-04 VITALS — BP 110/70 | HR 67 | Temp 98.1°F | Ht 64.0 in | Wt 86.5 lb

## 2020-04-04 DIAGNOSIS — J479 Bronchiectasis, uncomplicated: Secondary | ICD-10-CM | POA: Diagnosis not present

## 2020-04-04 DIAGNOSIS — R0600 Dyspnea, unspecified: Secondary | ICD-10-CM | POA: Diagnosis not present

## 2020-04-04 DIAGNOSIS — J432 Centrilobular emphysema: Secondary | ICD-10-CM | POA: Diagnosis not present

## 2020-04-04 DIAGNOSIS — R0609 Other forms of dyspnea: Secondary | ICD-10-CM

## 2020-04-04 MED ORDER — BENZONATATE 200 MG PO CAPS: 200.0000 mg | ORAL_CAPSULE | ORAL | 3 refills | Status: DC | PRN

## 2020-04-04 NOTE — Progress Notes (Signed)
[facility]  Chief Complaint  Patient presents with  . Follow-up    Mild Epyshema, more SOB with activites, cough with thick, yellow mucous.  feeling dizzy     Constitutional:  BP 110/70 (BP Location: Left Arm, Patient Position: Sitting, Cuff Size: Normal)   Pulse 67   Temp 98.1 F (36.7 C) (Temporal)   Ht 5\' 4"  (1.626 m)   Wt 86 lb 8 oz (39.2 kg)   SpO2 100%   BMI 14.85 kg/m   Past Medical History:  OA, Esophageal dysmotility, [place] esophagus, Gastroparesis, GERD, Iron deficiency anemia, Neuropathy, Raynaud's  Brief Summary:  [name] is a 79 y.o. female former smoker with bronchiectasis, recurrent aspiration pneumonia with history of reflux, emphysema and history of cryptogenic organizing pneumonia.  Subjective:  She was treated for pneumonia in March.  Has some improvement.  Still gets cough with clear to yellow sputum.  She has been using mucinex and tessalon.  Not having fever, wheeze, or hemoptysis.  Has been keeping up with exercise.  Saw rheumatology for Raynaud's.  Also saw neurology for neuropathy.  She was seen by gastroenterology and was advised that she could be a candidate for TIF.  Physical Exam:   Appearance - thin  ENMT - no sinus tenderness, no oral exudate, no LAN, Mallampati 2 airway, no stridor  Respiratory - scattered rhonchi  CV - s1s2 regular rate and rhythm, no murmurs  Ext - no clubbing, no edema  Skin - no rashes  Psych - normal mood and affect  Discussion:  She has bronchiectasis with recurrent pneumonias in setting of gastroesophageal reflux.  She has recurrent episodes of respiratory infection with increased sputum production.  This contributes to intermittent episodes of shortness of breath and fatigue.  Assessment/Plan:   Bronchiectasis, centrilobular emphysema. - she is unable to tolerate albuterol, hypertonic saline, or flutte valve - continue mucinex DM and tessalon prn -  discussed symptoms to monitor for that would indicate she needs antibiotics  Peripheral neuropathy. - followed by Dr. Delice Lesch with [facility]  Raynaud's phenomenon. - followed by Rheumatology at [facility]  GERD with dysphagia. - advised her to d/w Dr. Havery Moros about how much of impact TIF would have on her reflux and how this might impact management of her bronchiectasis and recurrent pneumonias  Cachexia. - discussed options to keep up with caloric and protein intake  A total of  35 minutes spent addressing patient care issues on day of visit.   Follow up:   Patient Instructions  Follow up in 3 months  Signature:  Chesley Mires, MD [place] Pager: [phone] 04/04/2020, 1:01 PM  Flow Sheet     Pulmonary tests:  PFT 12/31/09 >> FEV1 1.84(84%), FEV1% 73, TLC 4.94(98%), DLCO 77%, +BD. PFT 06/21/16 >> FEV1 1.22 (55%), FEV1% 76, TLC 5.29 (104%), DLCO 43% PFT 12/21/16 >> FEV1 1.49 (69%), FEV1% 78, TLC 5.42 (107%), RV 158%, DLCO 51%  Chest imaging:  CT chest 08/17/10 >> mild biapical scarring, basilar peribronchovascular nodularity and mild bronchiectasis HRCT chest 11/28/17 >> mild centrilobular emphysema, peribronchovascular nodularity, mild BTX, LLL consolidation CT chest 03/02/18 >> mild emphysema, areas of BTX, several nodular areas up to 1.4 cm CT chest 08/25/18 >> centrilobular emphysema, BTX with wall thickening and tree in bud RML/lingula/bilateral lower lobes, unchanged opacity LLL, new patchy opacities LUL  HRCT chest 09/21/19 >> apical scarring, diffuse BTX with peribronchovascular nodularity, areas of mucoid impaction, coronary calcification  Cardiac tests:  Echo11/23/20 >>  EF 60 to 65%, mild LVH, grade 2 DD, mild MR, mild TR  Medications:   Allergies as of 04/04/2020      Reactions   Cortisone    unsure   Prednisone    Sulfasalazine Other (See Comments)   Unsure of reaction   Sulfonamide Derivatives    Unsure of reaction       Medication List       Accurate as of Apr 04, 2020  1:01 PM. If you have any questions, ask your nurse or doctor.        benzonatate 200 MG capsule Commonly known as: TESSALON Take 1 capsule (200 mg total) by mouth as needed for cough.   capsaicin 0.025 % cream Commonly known as: ZOSTRIX Apply topically.   Dexilant 60 MG capsule Generic drug: dexlansoprazole Take 1 capsule (60 mg total) by mouth daily. Please schedule a yearly office visit for further refills.   fexofenadine 180 MG tablet Commonly known as: ALLEGRA Take 180 mg by mouth daily.   folic acid 1 MG tablet Commonly known as: FOLVITE Take 1 tablet (1 mg total) by mouth daily.   gabapentin 250 MG/5ML solution Commonly known as: NEURONTIN TAKE 2 ML AT BEDTIME.   Gaviscon Extra Strength 160-105 MG Chew Generic drug: Alum Hydroxide-Mag Carbonate Use as directed   NONFORMULARY OR COMPOUNDED ITEM Peripheral Neuropathy Cream: Bupivacaine 1%, Doxepin 3%, Gabapentin 6%, Pentoxifylline 3%, Topiramate 1% Order faxed to [facility]   pseudoephedrine-guaifenesin 60-600 MG 12 hr tablet Commonly known as: MUCINEX D Take by mouth.   spironolactone 25 MG tablet Commonly known as: ALDACTONE Take 0.5 tablets (12.5 mg total) by mouth daily.   sucralfate 1 g tablet Commonly known as: CARAFATE Take 1 g by mouth 3 (three) times daily.   triamcinolone 0.025 % cream Commonly known as: KENALOG   TURMERIC PO Take 1 tablet by mouth as needed.   TYLENOL EXTRA STRENGTH PO Take 350 mg by mouth as needed.       Past Surgical History:  She  has a past surgical history that includes Cholecystectomy (2005); Breast lumpectomy; Total abdominal hysterectomy; Lung biopsy; Cataract extraction; Colonoscopy; Upper gastrointestinal endoscopy; Esophageal manometry (N/A, 10/06/2015); 24 hour ph study (N/A, 10/13/2015); and Esophageal manometry (N/A, 03/10/2018).  Family History:  Her family history includes Cancer in her  maternal grandmother; Heart disease in her father and paternal grandfather; Leukemia in her paternal grandmother; Lung cancer in her paternal uncle; Lymphoma in her mother.  Social History:  She  reports that she quit smoking about 51 years ago. Her smoking use included cigarettes. She has never used smokeless tobacco. She reports that she does not drink alcohol or use drugs.

## 2020-04-04 NOTE — Patient Instructions (Signed)
Follow up in 3 months

## 2020-04-23 DIAGNOSIS — I872 Venous insufficiency (chronic) (peripheral): Secondary | ICD-10-CM | POA: Diagnosis not present

## 2020-04-23 DIAGNOSIS — J8410 Pulmonary fibrosis, unspecified: Secondary | ICD-10-CM | POA: Diagnosis not present

## 2020-04-23 DIAGNOSIS — G629 Polyneuropathy, unspecified: Secondary | ICD-10-CM | POA: Diagnosis not present

## 2020-04-29 DIAGNOSIS — Z01419 Encounter for gynecological examination (general) (routine) without abnormal findings: Secondary | ICD-10-CM | POA: Diagnosis not present

## 2020-04-29 DIAGNOSIS — N3941 Urge incontinence: Secondary | ICD-10-CM | POA: Diagnosis not present

## 2020-05-14 ENCOUNTER — Telehealth: Payer: Self-pay | Admitting: Gastroenterology

## 2020-05-14 NOTE — Telephone Encounter (Signed)
Patient reports that her GERD has flared up.  She has been taking her Dexilant at [place]. She will switch to q am 30 minutes before first breakfast.  She will resume her allegra and her carafate slurry as well as the Gaviscon PRN.  Follow up arranged for 07/15/20.  She has been added to the cancellation list and will call back if these measures do not improve her symptoms.

## 2020-05-14 NOTE — Telephone Encounter (Signed)
Pt states that she has been having a hard time swallowing. She would like to be seen asap.

## 2020-05-18 ENCOUNTER — Emergency Department (HOSPITAL_COMMUNITY): Payer: Medicare HMO

## 2020-05-18 ENCOUNTER — Other Ambulatory Visit: Payer: Self-pay

## 2020-05-18 ENCOUNTER — Emergency Department (HOSPITAL_COMMUNITY)
Admission: EM | Admit: 2020-05-18 | Discharge: 2020-05-18 | Disposition: A | Discharge status: Home / Self Care | Payer: Medicare HMO | Attending: Emergency Medicine | Admitting: Emergency Medicine

## 2020-05-18 ENCOUNTER — Encounter (HOSPITAL_COMMUNITY): Payer: Self-pay | Admitting: *Deleted

## 2020-05-18 DIAGNOSIS — I700 Atherosclerosis of aorta: Secondary | ICD-10-CM | POA: Diagnosis not present

## 2020-05-18 DIAGNOSIS — Z8673 Personal history of transient ischemic attack (TIA), and cerebral infarction without residual deficits: Secondary | ICD-10-CM | POA: Diagnosis not present

## 2020-05-18 DIAGNOSIS — I7300 Raynaud's syndrome without gangrene: Secondary | ICD-10-CM | POA: Insufficient documentation

## 2020-05-18 DIAGNOSIS — R911 Solitary pulmonary nodule: Secondary | ICD-10-CM | POA: Diagnosis not present

## 2020-05-18 DIAGNOSIS — Z87891 Personal history of nicotine dependence: Secondary | ICD-10-CM | POA: Diagnosis not present

## 2020-05-18 DIAGNOSIS — Z79899 Other long term (current) drug therapy: Secondary | ICD-10-CM | POA: Diagnosis not present

## 2020-05-18 DIAGNOSIS — R3915 Urgency of urination: Secondary | ICD-10-CM | POA: Insufficient documentation

## 2020-05-18 DIAGNOSIS — L539 Erythematous condition, unspecified: Secondary | ICD-10-CM | POA: Diagnosis not present

## 2020-05-18 DIAGNOSIS — R109 Unspecified abdominal pain: Secondary | ICD-10-CM | POA: Diagnosis not present

## 2020-05-18 DIAGNOSIS — N764 Abscess of vulva: Secondary | ICD-10-CM | POA: Insufficient documentation

## 2020-05-18 DIAGNOSIS — Z87440 Personal history of urinary (tract) infections: Secondary | ICD-10-CM | POA: Insufficient documentation

## 2020-05-18 DIAGNOSIS — R300 Dysuria: Secondary | ICD-10-CM | POA: Diagnosis not present

## 2020-05-18 LAB — CBC WITH DIFFERENTIAL/PLATELET
Abs Immature Granulocytes: 0.02 10*3/uL (ref 0.00–0.07)
Basophils Absolute: 0 10*3/uL (ref 0.0–0.1)
Basophils Relative: 0 %
Eosinophils Absolute: 0.1 10*3/uL (ref 0.0–0.5)
Eosinophils Relative: 1 %
HCT: 36.1 % (ref 36.0–46.0)
Hemoglobin: 11.2 g/dL — ABNORMAL LOW (ref 12.0–15.0)
Immature Granulocytes: 0 %
Lymphocytes Relative: 15 %
Lymphs Abs: 1.4 10*3/uL (ref 0.7–4.0)
MCH: 27.3 pg (ref 26.0–34.0)
MCHC: 31 g/dL (ref 30.0–36.0)
MCV: 87.8 fL (ref 80.0–100.0)
Monocytes Absolute: 0.7 10*3/uL (ref 0.1–1.0)
Monocytes Relative: 7 %
Neutro Abs: 7.5 10*3/uL (ref 1.7–7.7)
Neutrophils Relative %: 77 %
Platelets: 409 10*3/uL — ABNORMAL HIGH (ref 150–400)
RBC: 4.11 MIL/uL (ref 3.87–5.11)
RDW: 14.4 % (ref 11.5–15.5)
WBC: 9.8 10*3/uL (ref 4.0–10.5)
nRBC: 0 % (ref 0.0–0.2)

## 2020-05-18 LAB — BASIC METABOLIC PANEL
Anion gap: 9 (ref 5–15)
BUN: 6 mg/dL — ABNORMAL LOW (ref 8–23)
CO2: 30 mmol/L (ref 22–32)
Calcium: 8.7 mg/dL — ABNORMAL LOW (ref 8.9–10.3)
Chloride: 98 mmol/L (ref 98–111)
Creatinine, Ser: 0.35 mg/dL — ABNORMAL LOW (ref 0.44–1.00)
GFR calc Af Amer: >60 mL/min (ref >60)
GFR calc non Af Amer: >60 mL/min (ref >60)
Glucose, Bld: 93 mg/dL (ref 70–99)
Potassium: 3.5 mmol/L (ref 3.5–5.1)
Sodium: 137 mmol/L (ref 135–145)

## 2020-05-18 MED ORDER — SODIUM CHLORIDE (PF) 0.9 % IJ SOLN
INTRAMUSCULAR | Status: AC
  Filled 2020-05-18: qty 50

## 2020-05-18 MED ORDER — IOHEXOL 300 MG/ML  SOLN
75.0000 mL | Freq: Once | INTRAMUSCULAR | Status: AC | PRN
  Administered 2020-05-18: 75 mL via INTRAVENOUS

## 2020-05-18 MED ORDER — IOHEXOL 9 MG/ML PO SOLN
ORAL | Status: AC
  Filled 2020-05-18: qty 500

## 2020-05-18 MED ORDER — DOXYCYCLINE HYCLATE 100 MG PO CAPS: 100.0000 mg | ORAL_CAPSULE | Freq: Two times a day (BID) | ORAL | 0 refills | Status: DC

## 2020-05-18 MED ORDER — IOHEXOL 9 MG/ML PO SOLN: 500.0000 mL | ORAL | Status: AC

## 2020-05-18 MED ORDER — TRAMADOL HCL 50 MG PO TABS: 50.0000 mg | ORAL_TABLET | Freq: Four times a day (QID) | ORAL | 0 refills | Status: DC | PRN

## 2020-05-18 NOTE — Discharge Instructions (Signed)
Contact a health care provider if you have:  More redness, swelling, or pain around your abscess.  More fluid or blood coming from your abscess.  Warm skin around your abscess.  More pus or a bad smell coming from your abscess.  A fever.  Muscle aches.  Chills or a general ill feeling.  Get help right away if you:  Have severe pain.  See red streaks on your skin spreading away from the abscess.

## 2020-05-18 NOTE — ED Triage Notes (Signed)
Pt has vagina abscess for about 7 days, Increased pain with irritation of abscess.

## 2020-05-18 NOTE — ED Provider Notes (Signed)
Patient taken in sign out form PA [place]. Patient with Vulvar abscess. CT labs pending. 5:57 PM BP (!) 158/99   Pulse 65   Temp 97.7 F (36.5 C) (Oral)   Resp 17   Ht 5\' 4"  (1.626 m)   Wt 38.1 kg   SpO2 98%   BMI 14.42 kg/m  CT is negative for drainable fluid collection.  I reviewed the images myself.  The physical exam reveals left labial erythema and induration, there is purulence on the pubic hair.  Is tender to palpation.  Patient likely had an abscess that ruptured she has residual inflammatory/cellulitic change in the vulva.  We will treat with doxycycline.  Continue doing sitz bath's.  Patient will also get tramadol for pain.  I reviewed the PDMP prior to prescribing the tramadol.  She has a primary care doctor and a GYN with whom she can follow-up this week.  Discussed return precautions.  Patient is afebrile and hemodynamically stable.  BMP without significant abnormality, CBC without leukocytosis.   Margarita Mail, PA-C 05/18/20 1800    Malvin Johns, MD 05/18/20 [phone]

## 2020-05-18 NOTE — ED Provider Notes (Signed)
Medical screening examination/treatment/procedure(s) were conducted as a shared visit with non-physician practitioner(s) and myself.  I personally evaluated the patient during the encounter.    79 year old female presents with a draining abscess from her mons pubis.  Went to an urgent care before this.  Will order lab work as well as CT scan to evaluate further with possible surgical consultation   Lacretia Leigh, MD 05/18/20 1306

## 2020-05-18 NOTE — ED Provider Notes (Signed)
[facility] DEPT Provider Note   CSN: [id number] Arrival date & time: 05/18/20  1115     History Chief Complaint  Patient presents with  . Abscess    [name] is a 79 y.o. female.  HPI 79 year old female with a history of GERD, Raynaud's, gastroparesis, esophageal dysmotility presents to the ER with a abscess to her vulva which has been developing for over a week.  Patient was seen in urgent care earlier today for this, and was sent to the ER.  She states that she had a urinary tract infection approximately 2 weeks ago, and has recently finished the antibiotic course.  She has been having some urinary incontinence, however this appears to be chronic.  She states that this abscess developed shortly after she finished antibiotics.  She denies any fevers or chills, nausea, vomiting, abdominal pain.  No vaginal bleeding.  She states that the abscess is extremely painful and causing her great discomfort with movement.  She notes that it started draining several days ago.  It has been gradually growing in size.  She has not tried anything for her symptoms.    Past Medical History:  Diagnosis Date  . Allergy   . Arthritis   . BOOP (bronchiolitis obliterans with organizing pneumonia) ([place])   . Bronchiectasis   . Cataract   . Esophageal dysmotility   . Gastroparesis   . GERD (gastroesophageal reflux disease)    severe  . Iron deficiency anemia   . Neuropathy   . Pneumonia   . Raynaud's disease   . Right lower lobe pneumonia December 2013    Patient Active Problem List   Diagnosis Date Noted  . Left epiretinal membrane 04/01/2020  . Posterior vitreous detachment of both eyes 04/01/2020  . Venous insufficiency 01/02/2019  . Carpal tunnel syndrome of right wrist 11/24/2018  . Sore throat 12/08/2017  . Hoarseness 12/08/2017  . Abnormal CT scan 12/06/2017  . Laryngopharyngeal reflux (LPR) 10/03/2017  . Temporomandibular jaw dysfunction 10/03/2017  .  Tension-type headache, not intractable 09/12/2017  . Abdominal pain 04/28/2017  . Other fatigue 04/28/2017  . Pneumonia 12/14/2016  . Neuropathy 07/30/2016  . TIA (transient ischemic attack) 06/22/2016  . New onset of headaches after age 38 06/22/2016  . Dysphagia   . Allergic reaction 01/11/2015  . Arthritis of right ankle 10/09/2013  . Pes cavus 10/09/2013  . Loss of transverse plantar arch 10/09/2013  . Allergic rhinitis 05/14/2013  . BRONCHIECTASIS 12/15/2010  . Nutcracker esophagus 01/26/2008  . GERD 01/26/2008  . GASTROPARESIS 01/26/2008    Past Surgical History:  Procedure Laterality Date  . [place] STUDY N/A 10/13/2015   Procedure: [place] STUDY;  Surgeon: Gatha Mayer, MD;  Location: [facility];  Service: Endoscopy;  Laterality: N/A;  . BREAST LUMPECTOMY     left, x 2  . CATARACT EXTRACTION     both eyes  . CHOLECYSTECTOMY  2005  . COLONOSCOPY    . ESOPHAGEAL MANOMETRY N/A 10/06/2015   Procedure: ESOPHAGEAL MANOMETRY (EM);  Surgeon: Manus Gunning, MD;  Location: [facility];  Service: Gastroenterology;  Laterality: N/A;  . ESOPHAGEAL MANOMETRY N/A 03/10/2018   Procedure: ESOPHAGEAL MANOMETRY (EM);  Surgeon: Mauri Pole, MD;  Location: [facility];  Service: Endoscopy;  Laterality: N/A;  . LUNG BIOPSY     vats right 2003  . TOTAL ABDOMINAL HYSTERECTOMY    . UPPER GASTROINTESTINAL ENDOSCOPY       OB History  No obstetric history on file.     Family History  Problem Relation Age of Onset  . Lymphoma Mother   . Heart disease Father   . Lung cancer Paternal Uncle   . Heart disease Paternal Grandfather   . Leukemia Paternal Grandmother   . Cancer Maternal Grandmother        unknown type  . Colon cancer Neg Hx   . Esophageal cancer Neg Hx   . Rectal cancer Neg Hx   . Stomach cancer Neg Hx     Social History   Tobacco Use  . Smoking status: Former Smoker    Types: Cigarettes    Quit date: 11/15/1968    Years since quitting:  51.5  . Smokeless tobacco: Never Used  . Tobacco comment: 3 cigs a day  Vaping Use  . Vaping Use: Never used  Substance Use Topics  . Alcohol use: No  . Drug use: No    Home Medications Prior to Admission medications   Medication Sig Start Date End Date Taking? Authorizing Provider  Acetaminophen (TYLENOL EXTRA STRENGTH PO) Take 350 mg by mouth as needed.     [provider]  Alum Hydroxide-Mag Carbonate (GAVISCON EXTRA STRENGTH) 160-105 MG CHEW Use as directed 01/18/20   Armbruster, Carlota Raspberry, MD  benzonatate (TESSALON) 200 MG capsule Take 1 capsule (200 mg total) by mouth as needed for cough. 04/04/20   Chesley Mires, MD  capsaicin (ZOSTRIX) 0.025 % cream Apply topically. 11/26/19   [provider]  dexlansoprazole (DEXILANT) 60 MG capsule Take 1 capsule (60 mg total) by mouth daily. Please schedule a yearly office visit for further refills. 11/18/19   Zehr, Laban Emperor, PA-C  fexofenadine (ALLEGRA) 180 MG tablet Take 180 mg by mouth daily.    [provider]  folic acid (FOLVITE) 1 MG tablet Take 1 tablet (1 mg total) by mouth daily. 07/30/19   Cameron Sprang, MD  gabapentin (NEURONTIN) 250 MG/5ML solution TAKE 2 ML AT BEDTIME. 02/11/20   Cameron Sprang, MD  NONFORMULARY OR COMPOUNDED ITEM Peripheral Neuropathy Cream: Bupivacaine 1%, Doxepin 3%, Gabapentin 6%, Pentoxifylline 3%, Topiramate 1% Order faxed to [facility] 07/10/19   Marzetta Board, DPM  pseudoephedrine-guaifenesin (MUCINEX D) 60-600 MG 12 hr tablet Take by mouth.    [provider]  spironolactone (ALDACTONE) 25 MG tablet Take 0.5 tablets (12.5 mg total) by mouth daily. Patient not taking: Reported on 02/11/2020 12/12/19 03/11/20  Troy Sine, MD  sucralfate (CARAFATE) 1 g tablet Take 1 g by mouth 3 (three) times daily. 03/28/20   [provider]  triamcinolone (KENALOG) 0.025 % cream  04/11/19   [provider]  TURMERIC PO Take 1 tablet by mouth as needed.     [provider]    Allergies    Cortisone, Prednisone, Sulfasalazine, and Sulfonamide derivatives  Review of Systems   Review of Systems  Constitutional: Negative for chills and fever.  HENT: Negative for ear pain and sore throat.   Eyes: Negative for pain and visual disturbance.  Respiratory: Negative for cough and shortness of breath.   Cardiovascular: Negative for chest pain and palpitations.  Gastrointestinal: Negative for abdominal pain and vomiting.  Genitourinary: Positive for genital sores, urgency and vaginal pain. Negative for difficulty urinating, dysuria, hematuria, pelvic pain, vaginal bleeding and vaginal discharge.  Musculoskeletal: Negative for arthralgias and back pain.  Skin: Negative for color change and rash.  Neurological: Negative for seizures and syncope.  All other systems reviewed  and are negative.   Physical Exam Updated Vital Signs BP 139/84 (BP Location: Left Arm)   Pulse 74   Temp 97.7 F (36.5 C) (Oral)   Resp 17   Ht 5\' 4"  (1.626 m)   Wt 38.1 kg   SpO2 100%   BMI 14.42 kg/m   Physical Exam Vitals and nursing note reviewed.  Constitutional:      General: She is not in acute distress.    Appearance: Normal appearance. She is well-developed. She is not ill-appearing or diaphoretic.  HENT:     Head: Normocephalic and atraumatic.  Eyes:     Conjunctiva/sclera: Conjunctivae normal.  Cardiovascular:     Rate and Rhythm: Normal rate and regular rhythm.     Pulses: Normal pulses.     Heart sounds: Normal heart sounds. No murmur heard.   Pulmonary:     Effort: Pulmonary effort is normal. No respiratory distress.     Breath sounds: Normal breath sounds.  Abdominal:     General: Abdomen is flat.     Palpations: Abdomen is soft.     Tenderness: There is no abdominal tenderness.  Genitourinary:    Comments: Large 3 to 4 cm erythematous circumferential fluctuant abscess in the vulvar area, with mild drainage of pus.  Tender to  palpation. Musculoskeletal:        General: No tenderness or deformity. Normal range of motion.     Cervical back: Normal range of motion and neck supple.  Skin:    General: Skin is warm and dry.     Findings: No erythema or rash.  Neurological:     General: No focal deficit present.     Mental Status: She is alert and oriented to person, place, and time.  Psychiatric:        Mood and Affect: Mood normal.        Behavior: Behavior normal.     ED Results / Procedures / Treatments   Labs (all labs ordered are listed, but only abnormal results are displayed) Labs Reviewed  CBC WITH DIFFERENTIAL/PLATELET - Abnormal; Notable for the following components:      Result Value   Hemoglobin 11.2 (*)    Platelets 409 (*)    All other components within normal limits  BASIC METABOLIC PANEL    EKG None  Radiology No results found.  Procedures Procedures (including critical care time)  Medications Ordered in ED Medications  iohexol (OMNIPAQUE) 9 MG/ML oral solution (has no administration in time range)  iohexol (OMNIPAQUE) 9 MG/ML oral solution 500 mL (has no administration in time range)    ED Course  I have reviewed the triage vital signs and the nursing notes.  Pertinent labs & imaging results that were available during my care of the patient were reviewed by me and considered in my medical decision making (see chart for details).    MDM Rules/Calculators/A&P                         79 year old female with large abscess to the vulva On presentation, the patient is alert and oriented, nontoxic-appearing, no acute distress.  Vitals overall reassuring, she is afebrile.  Physical exam with large fluctuant abscess to the vulvar area with some mild to moderate drainage.  Given the nature and location of this, will CT for further evaluation.  Basic labs ordered.  Patient was also seen and evaluated with Dr. Zenia Resides.  Signed out patient to [employer], who will  oversee reviewing her  labs, and CT.  If CT comes back with no acute abnormalities, she is amenable for incision and drainage.  She would likely require antibiotics and close follow-up.  Final Clinical Impression(s) / ED Diagnoses Final diagnoses:  None    Rx / DC Orders ED Discharge Orders    None       Lyndel Safe 05/18/20 1457    Lacretia Leigh, MD 05/18/20 1531

## 2020-05-21 ENCOUNTER — Other Ambulatory Visit: Payer: Self-pay | Admitting: Gastroenterology

## 2020-05-21 ENCOUNTER — Telehealth: Payer: Self-pay | Admitting: Gastroenterology

## 2020-05-21 NOTE — Telephone Encounter (Signed)
Patient needs to refill Sulcrafate. Please send it to [facility].

## 2020-05-21 NOTE — Telephone Encounter (Signed)
Script sent for Carafate tablets with instructions to make a slurry

## 2020-05-27 DIAGNOSIS — R350 Frequency of micturition: Secondary | ICD-10-CM | POA: Diagnosis not present

## 2020-05-27 DIAGNOSIS — N898 Other specified noninflammatory disorders of vagina: Secondary | ICD-10-CM | POA: Diagnosis not present

## 2020-05-27 DIAGNOSIS — N764 Abscess of vulva: Secondary | ICD-10-CM | POA: Diagnosis not present

## 2020-05-29 DIAGNOSIS — J471 Bronchiectasis with (acute) exacerbation: Secondary | ICD-10-CM | POA: Diagnosis not present

## 2020-06-02 DIAGNOSIS — R69 Illness, unspecified: Secondary | ICD-10-CM | POA: Diagnosis not present

## 2020-06-09 ENCOUNTER — Ambulatory Visit (INDEPENDENT_AMBULATORY_CARE_PROVIDER_SITE_OTHER): Payer: Medicare HMO | Admitting: Podiatry

## 2020-06-09 ENCOUNTER — Other Ambulatory Visit: Payer: Self-pay

## 2020-06-09 ENCOUNTER — Encounter: Payer: Self-pay | Admitting: Podiatry

## 2020-06-09 DIAGNOSIS — M79674 Pain in right toe(s): Secondary | ICD-10-CM

## 2020-06-09 DIAGNOSIS — M7661 Achilles tendinitis, right leg: Secondary | ICD-10-CM | POA: Diagnosis not present

## 2020-06-09 DIAGNOSIS — L84 Corns and callosities: Secondary | ICD-10-CM

## 2020-06-09 DIAGNOSIS — G629 Polyneuropathy, unspecified: Secondary | ICD-10-CM

## 2020-06-09 DIAGNOSIS — M79675 Pain in left toe(s): Secondary | ICD-10-CM | POA: Diagnosis not present

## 2020-06-09 DIAGNOSIS — B351 Tinea unguium: Secondary | ICD-10-CM

## 2020-06-09 DIAGNOSIS — M7662 Achilles tendinitis, left leg: Secondary | ICD-10-CM | POA: Diagnosis not present

## 2020-06-11 NOTE — Progress Notes (Signed)
Subjective: [name] presents today for follow up of at risk foot care with history of peripheral neuropathy and corn(s) left 3rd digit and painful mycotic toenails b/l that are difficult to trim. Pain interferes with ambulation. Aggravating factors include wearing enclosed shoe gear. Pain is relieved with periodic professional debridement. She has past h/o of treatment of Achilles tendonitis via physical therapy with not much relief. She states it has started bothering her again. She feels she will just have to live with it.  She continues to use the detoxifying foot pads daily   She voices no new pedal problems on today's visit.  Allergies  Allergen Reactions  . Cortisone     unsure  . Prednisone   . Sulfasalazine Other (See Comments)    Unsure of reaction  . Sulfonamide Derivatives     Unsure of reaction     Objective: There were no vitals filed for this visit.  Pt is a pleasant 79 y.o. year old Caucasian female , WD, WN in NAD. AAO x 3.   Vascular Examination:  Capillary refill time to digits immediate b/l. Palpable DP pulses b/l. Palpable PT pulses b/l. Pedal hair sparse b/l. Skin temperature gradient within normal limits b/l.  Dermatological Examination: Pedal skin is thin shiny, atrophic b/l lower extremities. No open wounds bilaterally. No interdigital macerations bilaterally. Toenails 1-5 b/l elongated, discolored, dystrophic, thickened, crumbly with subungual debris and tenderness to dorsal palpation. Hyperkeratotic lesion(s) L 3rd toe.  No erythema, no edema, no drainage, no flocculence.  Musculoskeletal: Normal muscle strength 5/5 to all lower extremity muscle groups bilaterally. No pain crepitus or joint limitation noted with ROM b/l. Hammertoes noted to the 2-5 bilaterally. She has pain along Achilles tendon cord with equinus noted b/l. Also has tenderness at insertion b/l. No erythema, no warmth, no edema along cord.   Neurological: Protective sensation decreased  with 10 gram monofilament b/l. Vibratory sensation intact b/l. Proprioception intact bilaterally.  Assessment: 1. Pain due to onychomycosis of toenails of both feet   2. Corns   3. Achilles tendinitis of both lower extremities   4. Neuropathy     Plan: -Toenails 1-5 b/l were debrided in length and girth with sterile nail nippers and dremel without iatrogenic bleeding.  -Corn(s) L 3rd toe pared utilizing sterile scalpel blade without complication or incident. Total number debrided=1. -She has undergone physical therapy for Achilles tendonitis with not much improvement. I dispensed 2 heel lifts for her to place in her shoes to relieve tension of Achilles tendon.  -Patient to continue soft, supportive shoe gear daily. -Patient to report any pedal injuries to medical professional immediately. -Patient/POA to call should there be question/concern in the interim.  Return in about 3 months (around 09/09/2020) for nail and callus trim.

## 2020-07-03 ENCOUNTER — Other Ambulatory Visit: Payer: Self-pay

## 2020-07-03 ENCOUNTER — Ambulatory Visit (INDEPENDENT_AMBULATORY_CARE_PROVIDER_SITE_OTHER): Payer: Medicare HMO | Admitting: Internal Medicine

## 2020-07-03 ENCOUNTER — Encounter: Payer: Self-pay | Admitting: Internal Medicine

## 2020-07-03 DIAGNOSIS — J479 Bronchiectasis, uncomplicated: Secondary | ICD-10-CM | POA: Diagnosis not present

## 2020-07-03 MED ORDER — DOXYCYCLINE HYCLATE 100 MG PO CAPS: 100.0000 mg | ORAL_CAPSULE | Freq: Two times a day (BID) | ORAL | 11 refills | Status: DC

## 2020-07-03 NOTE — Progress Notes (Signed)
Tests PFT 12/31/09 >> FEV1 1.84(84%), FEV1% 73, TLC 4.94(98%), DLCO 77%, +BD. CT chest 08/17/10 >> mild biapical scarring, basilar peribronchovascular nodularity and mild bronchiectasis. Echo 06/04/15 >> EF 55%, probable PFO  Past medical history GERD, Nutcracker esophagus, Iron deficiency anemia, Gastroparesis, Raynaud's, Neuropathy Rt leg, endometriosis  Past surgical history, Family history, Social history, Allergies reviewed.  Vital signs BP 102/70 mmHg  Pulse 66  Ht 5\' 4"  (1.626 m)  Wt 93 lb 3.2 oz (42.275 kg)  BMI 15.99 kg/m2  SpO2 100%   History of Present Illness: [name] is a  74 . female former smoker with bronchiectasis, and recurrent aspiration.  She has prior history of cryptogenic organizing pneumonia.  She was found to have nutcracker esophagus.  She has sore throat and trouble swallowing.  She needed Abx in April.  Not as much cough or sputum now.  She keeps up with her exercise regimen.  She had episode of dizziness/confusion >> has appt with neurology. rec    11/10/2016 acute extended ov/[name] re: bronchiectasis flare  Chief Complaint  Patient presents with  . Acute Visit    Pt c/o fatigue, cough, chest soreness, sore throat and hoarseness for the past 3 wks. She was coughing up grey sputum, but more recently she is not coughing up anything.   recurrent pain exact same location R of midline where has had it with past bronchiectasis flares  assoc with grey mucus and chills  But no sob over baseline - onset of illness was subacute and gradually worse cough to point where severe, 24/7  rec Continue dexilant Take 30- 60 min before your first and last meals of the day  levaquin 500 mg daily x 7 days  For cough > mucinex dm 1200 mg evey 12 hours as needed  (ok to add pearls too) GERD diet      07/03/2020   extended ov/[name] re: cough / sood pt quit smoking 1970 / bronchiectasis Chief Complaint  Patient presents with  . Follow-up    SOB/cough/ wheezing/  worse since last seen   gradually worse over last 3 month with cough/ congestion / wheezing and "mucus always green"   Says does not have abx to have on hand for flares  Assoc severe HB despite dexilant/ f/b GI   No cp/ fever.  No obvious day to day or daytime variability or assoc  mucus plugs or hemoptysis or cp or chest tightness, subjective wheeze or overt sinus or hb symptoms.     Also denies any obvious fluctuation of symptoms with weather or environmental changes or other aggravating or alleviating factors except as outlined above   No unusual exposure hx or h/o childhood pna/ asthma or knowledge of premature birth.  Current Allergies, Complete Past Medical History, Past Surgical History, Family History, and Social History were reviewed in [employer] record.  ROS  The following are not active complaints unless bolded Hoarseness, sore throat, dysphagia, dental problems, itching, sneezing,  nasal congestion or discharge of excess mucus or purulent secretions, ear ache,   fever, chills, sweats, unintended wt loss or wt gain, classically pleuritic or exertional cp,  orthopnea pnd or arm/hand swelling  or leg swelling, presyncope, palpitations, abdominal pain, anorexia, nausea, vomiting, diarrhea  or change in bowel habits or change in bladder habits, change in stools or change in urine, dysuria, hematuria,  rash, arthralgias, visual complaints, headache, numbness, weakness or ataxia or problems with walking or coordination,  change in mood or  memory.           Physical Exam:  Very somber thin wf nad    07/03/2020       82   11/10/16 95 lb 6.4 oz (43.3 kg)  11/05/16 94 lb 5 oz (42.8 kg)  09/14/16 96 lb 6.4 oz (43.7 kg)     HEENT : pt wearing mask not removed for exam due to covid - 19 concerns.   NECK :  without JVD/Nodes/TM/ nl carotid upstrokes bilaterally   LUNGS: no acc muscle use,  Min barrel  contour chest wall with bilateral  slightly decreased bs s  audible wheeze and  without cough on insp or exp maneuvers and min  Hyperresonant  to  percussion bilaterally     CV:  RRR  no s3 or murmur or increase in P2, and no edema   ABD:  soft and nontender with pos end  insp Hoover's  in the supine position. No bruits or organomegaly appreciated, bowel sounds nl  MS:   Nl gait/  ext warm without deformities, calf tenderness, cyanosis or clubbing No obvious joint restrictions   SKIN: warm and dry without lesions    NEURO:  alert, approp, nl sensorium with  no motor or cerebellar deficits apparent.         I personally reviewed images and agree with radiology impression as follows:   Chest CT cuts on abd ct 05/18/20. Bronchiectasis, peribronchial thickening and mucoid impaction are redemonstrated. There is an area of atelectasis in the right lung base.    Assessment/Plan:

## 2020-07-03 NOTE — Patient Instructions (Signed)
GERD (REFLUX)  is an extremely common cause of respiratory symptoms just like yours , many times with no obvious heartburn at all.    It can be treated with medication, but also with lifestyle changes including elevation of the head of your bed (ideally with 6 -8inch blocks under the headboard of your bed),  Smoking cessation, avoidance of late meals, excessive alcohol, and avoid fatty foods, chocolate, peppermint, colas, red wine, and acidic juices such as orange juice.  NO MINT OR MENTHOL PRODUCTS SO NO COUGH DROPS  USE SUGARLESS CANDY INSTEAD (Jolley ranchers or Stover's or Life Savers) or even ice chips will also do - the key is to swallow to prevent all throat clearing. NO OIL BASED VITAMINS - use powdered substitutes.  Avoid fish oil when coughing.   For nasty mucus >> doxycycline 100 mg twice daily with large glass of water  - let us know if it doesn't help   Please schedule a follow up visit in 3 months -  Follow up with Dr Halford Chessman

## 2020-07-04 ENCOUNTER — Encounter: Payer: Self-pay | Admitting: Internal Medicine

## 2020-07-04 NOTE — Assessment & Plan Note (Signed)
PFT's  12/21/16   FEV1 1.49 (69 % ) ratio 78  p 7 % improvement from saba p nothing prior to study with DLCO  51/49c % corrects to 72 % for alv volume  And definite mild concave curvature to f/v loop   Assoc with chronic dysphagia noted > gi f/u planned   Reviewed GERD diet/ action plan  For worsening purulent sputum over baseline = doxy x 7 days if turns mucus back to white and if not would check sputum culture rather than empirically escalating abx based on risk side effects/ resistance developing   Medical decision making was a moderate level of complexity in this case because of  two chronic conditions /diagnoses (GERD and bronchiectasis)  requiring extra time for  H and P, chart review, counseling,    and generating customized AVS unique to this office visit with pt not seen by me in 4 years  and charting.   Each maintenance medication was reviewed in detail including emphasizing most importantly the difference between maintenance and prns and under what circumstances the prns are to be triggered using an action plan format where appropriate. Please see avs for details which were reviewed in writing by both me and my nurse and patient given a written copy highlighted where appropriate with yellow highlighter for the patient's continued care at home along with an updated version of their medications.  Patient was asked to maintain medication reconciliation by comparing this list to the actual medications being used at home and to contact this office right away if there is a conflict or discrepancy.

## 2020-07-10 DIAGNOSIS — H524 Presbyopia: Secondary | ICD-10-CM | POA: Diagnosis not present

## 2020-07-10 DIAGNOSIS — H5203 Hypermetropia, bilateral: Secondary | ICD-10-CM | POA: Diagnosis not present

## 2020-07-10 DIAGNOSIS — H52209 Unspecified astigmatism, unspecified eye: Secondary | ICD-10-CM | POA: Diagnosis not present

## 2020-07-12 ENCOUNTER — Other Ambulatory Visit: Payer: Self-pay | Admitting: Gastroenterology

## 2020-07-14 ENCOUNTER — Ambulatory Visit: Payer: Medicare HMO | Admitting: Cardiovascular Disease

## 2020-07-15 ENCOUNTER — Encounter: Payer: Self-pay | Admitting: Gastroenterology

## 2020-07-15 ENCOUNTER — Ambulatory Visit (INDEPENDENT_AMBULATORY_CARE_PROVIDER_SITE_OTHER): Payer: Medicare HMO | Admitting: Gastroenterology

## 2020-07-15 VITALS — BP 124/80 | HR 64 | Ht 62.5 in | Wt 83.0 lb

## 2020-07-15 DIAGNOSIS — K219 Gastro-esophageal reflux disease without esophagitis: Secondary | ICD-10-CM | POA: Diagnosis not present

## 2020-07-15 DIAGNOSIS — J479 Bronchiectasis, uncomplicated: Secondary | ICD-10-CM

## 2020-07-15 NOTE — Progress Notes (Signed)
HPI :  79 year old female here for a follow-up visit for reflux.  She also has a history of chronic lung disease with bronchiectasis followed by pulmonary.  See prior notes for details of her case.  She has severe ongoing poorly controlled reflux.  Prior work-up has shown a DeMeester score of greater than 150 during pH study.  She has had an EGD in 2020 most recently, as well as esophageal manometry and barium swallow studies.  She has continued to take Dexilant and Carafate since her last visit with me.  She has ongoing symptoms of pyrosis, water brash, regurgitation on a daily basis.  She states despite taking the medications it really does not help too much.  Carafate may take the edge off for short period of time.  She also has taken Gaviscon in the past as well as other over-the-counter antacids which provide very short-lived relief.  She has symptoms on a daily basis.  Her appetite is not so good, states her throat feels swollen and that feels like it makes her have difficulty swallowing at times.  She weighs 83 pounds at this point, has a poor appetite in general due to symptoms.  She was previously referred to see Dr. Bryan Lemma for TIF procedure.  She is deemed a candidate for TIF however has been quite anxious about proceeding with this.  She lives alone and is concerned about her recovery and if she needs assistance at home with this.  She has ongoing dry cough she thinks related to her reflux.   She has seen Dr. Hassell Done of general surgery to discuss Nissen fundoplication and wants to avoid surgery if possible.   Prior work-up: EGD 08/2015 - 1cm HH, gastritis without ulceration, HP negative pH study done in 2016 showingDemeester score of 152.7 but only symptom index of 23% for reflux. Most of reflux was weakly acidic. Esophageal manometry showed what was thought to be nutcracker esophagusat the time Esophogram 10/10/2017 - small sliding type hiatal hernia, no distal stricture / mass,  normal motility Repeat manometry 03/10/2018 - hypercontractile esophagus, no other significant pathology  Colonoscopy 11/20/18 -  The perianal and digital rectal examinations were normal. - The terminal ileum appeared normal. - A 3 mm polyp was found in the ileocecal valve. The polyp was sessile. The polyp was removed with a cold snare. Resection and retrieval were complete. - A 3 mm polyp was found in the rectum. The polyp was sessile. The polyp was removed with a cold snare. Resection and retrieval were complete. - The exam was otherwise without abnormality other than some tortousity. - Biopsies for histology were taken with a cold forceps from the right colon and left colon for evaluation of microscopic colitis given the patient's incidental complaints of persistent loose Stools.  EGD 11/20/18: - The z line was slightly irregular but did not meet biopsy criteria for BE (previously biopsied as well as negative). The exam of the esophagus was otherwise normal. - The entire examined stomach was normal. Hill grade II on retroflexed views (although challenging to get a good photo of this) - A single 3 to 4 mm sessile polyp was found in the second portion of the duodenum. The polyp was removed with a cold snare. Resection and retrieval were complete. Relook at the site prior to the end of the case showed no bleeding. - The exam of the duodenum was otherwise normal.   Past Medical History:  Diagnosis Date  . Allergy   . Arthritis   .  BOOP (bronchiolitis obliterans with organizing pneumonia) ([place])   . Bronchiectasis   . Cataract   . Esophageal dysmotility   . Gastroparesis   . GERD (gastroesophageal reflux disease)    severe  . Iron deficiency anemia   . Neuropathy   . Pneumonia   . Raynaud's disease   . Right lower lobe pneumonia December 2013     Past Surgical History:  Procedure Laterality Date  . [place] STUDY N/A 10/13/2015   Procedure: [place] STUDY;  Surgeon: Gatha Mayer, MD;  Location: [facility];  Service: Endoscopy;  Laterality: N/A;  . BREAST LUMPECTOMY     left, x 2  . CATARACT EXTRACTION     both eyes  . CHOLECYSTECTOMY  2005  . COLONOSCOPY    . ESOPHAGEAL MANOMETRY N/A 10/06/2015   Procedure: ESOPHAGEAL MANOMETRY (EM);  Surgeon: Manus Gunning, MD;  Location: [facility];  Service: Gastroenterology;  Laterality: N/A;  . ESOPHAGEAL MANOMETRY N/A 03/10/2018   Procedure: ESOPHAGEAL MANOMETRY (EM);  Surgeon: Mauri Pole, MD;  Location: [facility];  Service: Endoscopy;  Laterality: N/A;  . LUNG BIOPSY     vats right 2003  . TOTAL ABDOMINAL HYSTERECTOMY    . UPPER GASTROINTESTINAL ENDOSCOPY     Family History  Problem Relation Age of Onset  . Lymphoma Mother   . Heart disease Father   . Lung cancer Paternal Uncle   . Heart disease Paternal Grandfather   . Leukemia Paternal Grandmother   . Cancer Maternal Grandmother        unknown type  . Colon cancer Neg Hx   . Esophageal cancer Neg Hx   . Rectal cancer Neg Hx   . Stomach cancer Neg Hx    Social History   Tobacco Use  . Smoking status: Former Smoker    Types: Cigarettes    Quit date: 11/15/1968    Years since quitting: 51.6  . Smokeless tobacco: Never Used  . Tobacco comment: 3 cigs a day  Vaping Use  . Vaping Use: Never used  Substance Use Topics  . Alcohol use: No  . Drug use: No   Current Outpatient Medications  Medication Sig Dispense Refill  . Acetaminophen (TYLENOL EXTRA STRENGTH PO) Take 350 mg by mouth as needed.     . Alum Hydroxide-Mag Carbonate (GAVISCON EXTRA STRENGTH) 160-105 MG CHEW Use as directed  0  . benzonatate (TESSALON) 200 MG capsule Take 1 capsule (200 mg total) by mouth as needed for cough. 90 capsule 3  . capsaicin (ZOSTRIX) 0.025 % cream Apply topically.    [name] dexlansoprazole (DEXILANT) 60 MG capsule Take 1 capsule (60 mg total) by mouth daily. Please schedule a yearly office visit for further refills. 90 capsule 0  .  doxycycline (VIBRAMYCIN) 100 MG capsule Take 1 capsule (100 mg total) by mouth 2 (two) times daily. One po bid x 7 days 14 capsule 11  . fexofenadine (ALLEGRA) 180 MG tablet Take 180 mg by mouth daily.    [name] gabapentin (NEURONTIN) 250 MG/5ML solution TAKE 2 ML AT BEDTIME. 180 mL 3  . NONFORMULARY OR COMPOUNDED ITEM Peripheral Neuropathy Cream: Bupivacaine 1%, Doxepin 3%, Gabapentin 6%, Pentoxifylline 3%, Topiramate 1% Order faxed to [place] 1 each 3  . pseudoephedrine-guaifenesin (MUCINEX D) 60-600 MG 12 hr tablet Take by mouth.    [name] SHINGRIX injection     . spironolactone (ALDACTONE) 25 MG tablet Take 0.5 tablets (12.5 mg total) by mouth daily. (Patient not taking:  Reported on 02/11/2020) 45 tablet 3  . sucralfate (CARAFATE) 1 g tablet TAKE 1 TABLET EVERY 6 HOURS AS NEEDED. MAY DISSOLVE 1 TABLET IN 1 TBSP OF DISTILLED WATER 60 tablet 3  . traMADol (ULTRAM) 50 MG tablet Take 1 tablet (50 mg total) by mouth every 6 (six) hours as needed. 15 tablet 0  . triamcinolone (KENALOG) 0.025 % cream     . TURMERIC PO Take 1 tablet by mouth as needed.     No current facility-administered medications for this visit.   Allergies  Allergen Reactions  . Cortisone     unsure  . Prednisone   . Sulfasalazine Other (See Comments)    Unsure of reaction  . Sulfonamide Derivatives     Unsure of reaction     Review of Systems: All systems reviewed and negative except where noted in HPI.   Lab Results  Component Value Date   WBC 9.8 05/18/2020   HGB 11.2 (L) 05/18/2020   HCT 36.1 05/18/2020   MCV 87.8 05/18/2020   PLT 409 (H) 05/18/2020   Lab Results  Component Value Date   CREATININE 0.35 (L) 05/18/2020   BUN 6 (L) 05/18/2020   NA 137 05/18/2020   K 3.5 05/18/2020   CL 98 05/18/2020   CO2 30 05/18/2020      Physical Exam: Ht 5' 2.5" (1.588 m)   Wt 83 lb (37.6 kg)   BMI 14.94 kg/m  Constitutional: Pleasant,well-developed, female in no acute distress. Neurological: Alert and  oriented to person place and time. Skin: Skin is warm and dry. No rashes noted. Psychiatric: Normal mood and affect. Behavior is normal.   ASSESSMENT AND PLAN: 79 year old female here for reassessment of the following:  GERD / Bronchiectasis - ongoing poorly controlled reflux despite high-dose Dexilant and Carafate as well as other antacids.  Unfortunately this is really impacting her quality of life and I think perhaps making her lung disease worse.  She has severe reflux based on objective data as outlined.  I really think she needs another mechanism to control her symptoms as medications are clearly not working.  We have discussed surgery versus TIF.  I have recommended TIF as I think this will provide a very good result for her and have less risk / recovery than surgery.  I discussed with her at length her concerns about this.  Essentially she is most worried about her recovery for a week or 2 after the procedure in her ability to be on her own and be dependent on her children.  I think outside of dietary changes, she would probably be able to function okay and get through this on her own however I do think she should follow-up with Dr. Bryan Lemma to discuss the procedure again as I do think she is a good candidate and this would really help her long-term.  She does agree that she needs to make a change in treatment plan and that TIF is probably her best option, however needs to deal with her anxiety about this.  She is agreeable to see Dr. Bryan Lemma again for another consultation about TIF and he can explain to her better about how she can manage her postop recovery, and if she feels comfortable about the plan she will probably proceed with it.  All questions answered, she agreed to the plan.  I spent 35 minutes of time, including in depth chart review, communicating results with the patient directly, face-to-face time with the patient, coordinating care, and documenting the  encounter.  Pisgah Cellar, MD [facility]enter[facility]

## 2020-07-15 NOTE — Patient Instructions (Addendum)
If you are age 77 or older, your body mass index should be between 23-30. Your Body mass index is 14.94 kg/m. If this is out of the aforementioned range listed, please consider follow up with your Primary Care Provider.  If you are age 62 or younger, your body mass index should be between 19-25. Your Body mass index is 14.94 kg/m. If this is out of the aformentioned range listed, please consider follow up with your Primary Care Provider.   Continue present medications.  You have an appointment with Dr. Bryan Lemma on Friday, 07-17-20 at 2:00pm to discuss the TIF procedure some more.  His office is located at [address], [place], [place] [address].  The number is [phone].   Thank you for entrusting me with your care and for choosing [facility], Dr. Ward Cellar

## 2020-07-18 ENCOUNTER — Encounter: Payer: Self-pay | Admitting: Gastroenterology

## 2020-07-18 ENCOUNTER — Ambulatory Visit (INDEPENDENT_AMBULATORY_CARE_PROVIDER_SITE_OTHER): Payer: Medicare HMO | Admitting: Gastroenterology

## 2020-07-18 VITALS — BP 126/80 | HR 64 | Ht 62.0 in | Wt 84.5 lb

## 2020-07-18 DIAGNOSIS — R64 Cachexia: Secondary | ICD-10-CM

## 2020-07-18 DIAGNOSIS — J479 Bronchiectasis, uncomplicated: Secondary | ICD-10-CM

## 2020-07-18 DIAGNOSIS — R1310 Dysphagia, unspecified: Secondary | ICD-10-CM

## 2020-07-18 DIAGNOSIS — R1319 Other dysphagia: Secondary | ICD-10-CM

## 2020-07-18 DIAGNOSIS — K219 Gastro-esophageal reflux disease without esophagitis: Secondary | ICD-10-CM | POA: Diagnosis not present

## 2020-07-18 NOTE — Progress Notes (Signed)
P  Chief Complaint:    GERD, bronchiectasis  GI History: Patient is a 79 y.o. female referred to me by Dr. Havery Moros for re-evaluation for possible TIF for chronic reflux.    She additionally has a history of bronchiectasis and follows with Dr. Halford Chessman in the Pulmonary Clinic.  She has a longstanding history of severe reflux with evaluation as below.  She is currently treated with Dexilant 60 mg daily and prn Carafate, but has been noticing increasing reflux symptoms over the last 15 months or so.  No improvement with previous trial of baclofen so this was discontinued.  Evaluated by Dr. Hassell Done of General Surgery in 2019 to discuss possible Nissen fundoplication, but she declined owing to risks of surgery.    Initially seen by me for TIF evaluation 11/2018, but she decided to hold off.  Most recently seen by Dr. Havery Moros last week and was again referred to me to discuss TIF.  Previously endorsed episodic dysphagia to solids but no stricture noted on EGD or esophagram previously.  EM  in 2016 showed what was thought to be Nutcracker Esophagus at the time, but normal motility on esophagram 2018. Trialed diltiazem without improvement.   Of note, she has a history of chronic bronchiectasis and recurrent pneumonia, thought secondary to ongoing severe reflux.  -EGD 08/2015 - 1cm HH, gastritis without ulceration, HP negative -pH study done in 2016 showingDemeester score of 152.7 but only symptom index of 23% for reflux. Most of reflux was weakly acidic. -Esophageal manometry showed what was thought to be nutcracker esophagusat the time; No swallows with DCI > 8000 - GES normal 2016 -Esophogram 10/10/2017 - small sliding type hiatal hernia, no distal stricture / mass, normal motility -Repeat manometry 03/10/2018 - hypercontractile esophagus, no other significant pathology; single swallow with DCI >8000 -EGD 11/20/2018- no hiatal hernia, Hill grade 2 valve, slightly irregular Z line but no BP, 3 to  4 mm duodenal adenoma removed by cold snare with otherwise normal-appearing stomach and duodenum.  GERD HQL questionnaire: 39/50 (11/2018), 40/50 (07/2020)   Colonoscopy 11/20/18 - The perianal and digital rectal examinations were normal. - The terminal ileum appeared normal. - A 3 mm polyp was found in the ileocecal valve. The polyp was sessile. The polyp was removed with a cold snare. Resection and retrieval were complete. - A 3 mm polyp was found in the rectum. The polyp was sessile. The polyp was removed with a cold snare. Resection and retrieval were complete. - The exam was otherwise without abnormality other than some tortousity. - Biopsies for histology were taken with a cold forceps from the right colon and left colon for evaluation of microscopic colitis given the patient's incidental complaints of persistent loose Stools.  HPI:     Patient is a 79 y.o. female presenting to the Gastroenterology Clinic for follow-up.  Was initially seen by me in 11/2018 for evaluation of TIF examined above.  She elected to hold off in favor of medical management, but returns today with ongoing reflux symptoms and is interested in antireflux surgical options.  She presents with her daughter and her son today.  She states that reflux symptoms have been worsening lately and continue to negatively impact her quality of life. She continues to take Dexilant and Carafate along with other OTC antacids.    Hoarsness, losing voice, HB, dyspepsia, nausea without emsis. Chronic cough.  Was seen by Dr. Halford Chessman 03/2020 to again discussed optimizing reflux management with consideration of TIF.  Additionally, she  continues to have intermittent dysphagia, but this seems to be worsening over the last 12-15 months.  She has modified her diet, eating slowly, cutting food into small pieces and chewing thoroughly with plenty of fluids with meals.  Feels that food slide down slowly in her esophagus.  No impactions.  This  appears to be a change from her previous appointment.  Has lost approximately 6 pounds from my initial appointment through 11/2018.  BMI 15.5.  Review of systems:     No chest pain, no SOB, no fevers, no urinary sx   Past Medical History:  Diagnosis Date  . Allergy   . Arthritis   . BOOP (bronchiolitis obliterans with organizing pneumonia) ([place])   . Bronchiectasis   . Cataract   . Esophageal dysmotility   . Gastroparesis   . GERD (gastroesophageal reflux disease)    severe  . Iron deficiency anemia   . Neuropathy   . Pneumonia   . Raynaud's disease   . Right lower lobe pneumonia December 2013    Patient's surgical history, family medical history, social history, medications and allergies were all reviewed in Epic    Current Outpatient Medications  Medication Sig Dispense Refill  . Acetaminophen (TYLENOL EXTRA STRENGTH PO) Take 350 mg by mouth as needed.     . benzonatate (TESSALON) 200 MG capsule Take 1 capsule (200 mg total) by mouth as needed for cough. 90 capsule 3  . dexlansoprazole (DEXILANT) 60 MG capsule Take 1 capsule (60 mg total) by mouth daily. Please schedule a yearly office visit for further refills. 90 capsule 0  . fexofenadine (ALLEGRA) 180 MG tablet Take 180 mg by mouth daily.    [name] gabapentin (NEURONTIN) 250 MG/5ML solution TAKE 2 ML AT BEDTIME. 180 mL 3  . NONFORMULARY OR COMPOUNDED ITEM Peripheral Neuropathy Cream: Bupivacaine 1%, Doxepin 3%, Gabapentin 6%, Pentoxifylline 3%, Topiramate 1% Order faxed to [place] 1 each 3  . pseudoephedrine-guaifenesin (MUCINEX D) 60-600 MG 12 hr tablet Take by mouth.    . sucralfate (CARAFATE) 1 g tablet TAKE 1 TABLET EVERY 6 HOURS AS NEEDED. MAY DISSOLVE 1 TABLET IN 1 TBSP OF DISTILLED WATER 60 tablet 3  . TURMERIC PO Take 1 tablet by mouth as needed.    . Alum Hydroxide-Mag Carbonate (GAVISCON EXTRA STRENGTH) 160-105 MG CHEW Use as directed (Patient not taking: Reported on 07/18/2020)  0   No current  facility-administered medications for this visit.    Physical Exam:     BP 126/80   Pulse 64   Ht 5\' 2"  (1.575 m)   Wt 84 lb 8 oz (38.3 kg)   BMI 15.46 kg/m   GENERAL:  Pleasant, thin appearing female in NAD PSYCH: : Cooperative, normal affect NEURO: Alert and oriented x 3, no focal neurologic deficits   IMPRESSION and PLAN:    1) GERD 2) Dysphagia 3) Bronchiectasis 4) History of Recurrent aspiration pneumonia  79 year old female with longstanding history of reflux, currently treated with Dexilant and Carafate, but with increasing frequency of breakthrough symptoms over the last year or so.  Additionally, with chronic cough that seemingly increasing irritation of underlying bronchiectasis, suspected 2/2 LPR.  I had a long conversation with the patient, her son, and her daughter present in the exam room today.  We discussed TIF and other antireflux surgical options (i.e. Nissen and partial fundoplication, LINX) at length.  We again reviewed her prior EM studies. While there is hypercontractility noted in 2016 with average DCI  >5000, there was  no single swallow >8000. The more recent study in 2019 with avg DCI 4983 with a single swallow >8000. Using current guidelines, does not meet [place] classification diagnostic criteria for Jackhammer Esophagus.  Additionally, per current literature (BMJ, 2019), no contraindication for antireflux procedures or elevated risk of postoperative dysphagia in patients with hypercontractile disorders.Limited data demonstrated trend towards clinical improvement with intervention.  However, it seems as though her symptomatology has changed in the last year, now with dysphagia.  Unsure if this represents reflux burnout, peptic stricture, or other new motility disorder, but certainly needs to be evaluated prior to entertaining any antireflux surgery.  -EGD to reassess for peptic stricture, hernia, erosive esophagitis along with preoperative assessment given  time since last endoscopic evaluation -If EGD otherwise unrevealing, will plan for repeat Esophageal Manometry to ensure no new motility disorder that would be a contraindication to antireflux surgery -Resume Dexilant and Carafate -Follow-up with me after studies are complete -Discussed postoperative diet and activity restrictions along with plan for overnight admission should she acutely proceed with TIF -Discussed relationship of reflux and bronchiectasis.  There are good data to support antireflux surgery in patients with bronchiectasis with the hope of decreasing LPR.  If proceeding with surgery, would certainly keep her Pulmonologist in the loop  5) Cachexia (BMI 15.5) -Discussed cachexia and low BMI as a risk factor for surgery.  Albumin is thankfully normal -Continue p.o. intake as tolerated.  Recommend supplementing with high caloric drinks such as Ensure, boost, etc. -Discussed typical weight loss after antireflux surgery is about 15 pounds, which she could certainly not afford to lose, so would ideally like to see her BMI >17 preop and ensure tolerating high caloric intake  I spent 50 minutes of time, including in depth chart review, independent review of results as outlined above, communicating results with the patient directly, face-to-face time with the patient, coordinating care, ordering studies and medications as appropriate, and documentation.    The indications, risks, and benefits of EGD were explained to the patient in detail. Risks include but are not limited to bleeding, perforation, adverse reaction to medications, and cardiopulmonary compromise. Sequelae include but are not limited to the possibility of surgery, hositalization, and mortality. The patient verbalized understanding and wished to proceed. All questions answered, referred to scheduler. Further recommendations pending results of the exam.       Lavena Bullion ,DO, FACG 07/18/2020, 2:38 PM

## 2020-07-18 NOTE — Patient Instructions (Addendum)
If you are age 77 or older, your body mass index should be between 23-30. Your Body mass index is 15.46 kg/m. If this is out of the aforementioned range listed, please consider follow up with your Primary Care Provider.  If you are age 57 or younger, your body mass index should be between 19-25. Your Body mass index is 15.46 kg/m. If this is out of the aformentioned range listed, please consider follow up with your Primary Care Provider.    You have been scheduled for an endoscopy. Please follow written instructions given to you at your visit today. If you use inhalers (even only as needed), please bring them with you on the day of your procedure.  We will contact you with a date for Manometry at [facility].   It was a pleasure to see you today!  [name], D.O.

## 2020-07-22 ENCOUNTER — Other Ambulatory Visit: Payer: Self-pay | Admitting: Gastroenterology

## 2020-07-29 ENCOUNTER — Other Ambulatory Visit: Payer: Self-pay

## 2020-07-29 ENCOUNTER — Telehealth: Payer: Self-pay

## 2020-07-29 DIAGNOSIS — K219 Gastro-esophageal reflux disease without esophagitis: Secondary | ICD-10-CM

## 2020-07-29 NOTE — Telephone Encounter (Signed)
Left message on patients voicemail regarding Esophageal Manometry scheduled for 10/15/20 at 12:30 pm at [place] screening scheduled for 10/10/20 at 8:30 am.  Requested a call from patient to confirm receipt of this message.  Instructions mailed to patient.

## 2020-08-01 ENCOUNTER — Other Ambulatory Visit: Payer: Self-pay

## 2020-08-01 ENCOUNTER — Ambulatory Visit (AMBULATORY_SURGERY_CENTER): Payer: Medicare HMO | Admitting: Gastroenterology

## 2020-08-01 ENCOUNTER — Encounter: Payer: Self-pay | Admitting: Gastroenterology

## 2020-08-01 VITALS — BP 148/93 | HR 100 | Temp 98.2°F | Resp 10 | Ht 61.0 in | Wt 84.0 lb

## 2020-08-01 DIAGNOSIS — K228 Other specified diseases of esophagus: Secondary | ICD-10-CM | POA: Diagnosis not present

## 2020-08-01 DIAGNOSIS — K2970 Gastritis, unspecified, without bleeding: Secondary | ICD-10-CM

## 2020-08-01 DIAGNOSIS — K449 Diaphragmatic hernia without obstruction or gangrene: Secondary | ICD-10-CM | POA: Diagnosis not present

## 2020-08-01 DIAGNOSIS — K2990 Gastroduodenitis, unspecified, without bleeding: Secondary | ICD-10-CM | POA: Diagnosis not present

## 2020-08-01 DIAGNOSIS — K2100 Gastro-esophageal reflux disease with esophagitis, without bleeding: Secondary | ICD-10-CM | POA: Diagnosis not present

## 2020-08-01 DIAGNOSIS — K2950 Unspecified chronic gastritis without bleeding: Secondary | ICD-10-CM | POA: Diagnosis not present

## 2020-08-01 DIAGNOSIS — K2210 Ulcer of esophagus without bleeding: Secondary | ICD-10-CM | POA: Diagnosis not present

## 2020-08-01 DIAGNOSIS — K219 Gastro-esophageal reflux disease without esophagitis: Secondary | ICD-10-CM | POA: Diagnosis not present

## 2020-08-01 MED ORDER — SODIUM CHLORIDE 0.9 % IV SOLN: 500.0000 mL | Freq: Once | INTRAVENOUS | Status: DC

## 2020-08-01 NOTE — Progress Notes (Signed)
A and O x3. Report to RN. Tolerated MAC anesthesia well.Teeth unchanged after procedure.

## 2020-08-01 NOTE — Progress Notes (Signed)
[place] vitals and SM Iv.

## 2020-08-01 NOTE — Op Note (Signed)
[facility] Patient Name: [name] Procedure Date: 08/01/2020 10:13 AM MRN: [id number] Endoscopist: Gerrit Heck , MD Age: 79 Referring MD:  Date of Birth: Dec 07, 1940 Gender: Female Account #: [id number] Procedure:                Upper GI endoscopy Indications:              Dysphagia, Esophageal reflux, Preoperative                            assessment, Chronic cough, Dyspepsia, Nausea,                            Weight loss                           79 yo female with long-standing history of GERD,                            with increasing symptoms despite Dexilant and                            Carafate, along with dysphagia, presents for                            endoscopic evaluation and pre-operative assessment                            for potential antireflux surgery. Medicines:                Monitored Anesthesia Care Procedure:                Pre-Anesthesia Assessment:                           - Prior to the procedure, a History and Physical                            was performed, and patient medications and                            allergies were reviewed. The patient's tolerance of                            previous anesthesia was also reviewed. The risks                            and benefits of the procedure and the sedation                            options and risks were discussed with the patient.                            All questions were answered, and informed consent                            was obtained.  Prior Anticoagulants: The patient has                            taken no previous anticoagulant or antiplatelet                            agents. ASA Grade Assessment: II - A patient with                            mild systemic disease. After reviewing the risks                            and benefits, the patient was deemed in                            satisfactory condition to undergo the procedure.                           -  Prior to the procedure, a History and Physical                            was performed, and patient medications and                            allergies were reviewed. The patient's tolerance of                            previous anesthesia was also reviewed. The risks                            and benefits of the procedure and the sedation                            options and risks were discussed with the patient.                            All questions were answered, and informed consent                            was obtained. Prior Anticoagulants: The patient has                            taken no previous anticoagulant or antiplatelet                            agents. ASA Grade Assessment: II - A patient with                            mild systemic disease. After reviewing the risks                            and benefits, the patient was deemed in  satisfactory condition to undergo the procedure.                           After obtaining informed consent, the endoscope was                            passed under direct vision. Throughout the                            procedure, the patient's blood pressure, pulse, and                            oxygen saturations were monitored continuously. The                            Endoscope was introduced through the mouth, and                            advanced to the second part of duodenum. The upper                            GI endoscopy was accomplished without difficulty.                            The patient tolerated the procedure well. Scope In: Scope Out: Findings:                 Few superficial esophageal ulcers with no stigmata                            of recent bleeding were found in the middle third                            of the esophagus. Biopsies were taken with a cold                            forceps for histology. Estimated blood loss was                            minimal.                            The Z-line was regular and was found 38 cm from the                            incisors.                           The gastroesophageal flap valve was visualized                            endoscopically and classified as Hill Grade IV (no                            fold, wide open  lumen, hiatal hernia present).                           A 1 cm sliding type hiatal hernia was present. This                            measured 1 cm in axial height but 4 cm in                            transverse width on retroflexed views.                           Mild inflammation characterized by congestion                            (edema) and erythema was found in the gastric                            fundus and in the gastric body. Biopsies were taken                            with a cold forceps for Helicobacter pylori                            testing. Estimated blood loss was minimal.                           The incisura, gastric antrum and pylorus were                            normal.                           The examined duodenum was normal. Complications:            No immediate complications. Estimated Blood Loss:     Estimated blood loss was minimal. Impression:               - Esophageal ulcers with no stigmata of recent                            bleeding. Biopsied.                           - Z-line regular, 38 cm from the incisors.                           - Gastroesophageal flap valve classified as Hill                            Grade IV (no fold, wide open lumen, hiatal hernia                            present).                           -  1 cm hiatal hernia.                           - Gastritis. Biopsied.                           - Normal incisura, antrum and pylorus.                           - Normal examined duodenum. Recommendation:           - Patient has a contact number available for                            emergencies. The signs and  symptoms of potential                            delayed complications were discussed with the                            patient. Return to normal activities tomorrow.                            Written discharge instructions were provided to the                            patient.                           - Resume previous diet.                           - Continue present medications.                           - Await pathology results.                           - Perform routine esophageal manometry at                            appointment to be scheduled.                           - Return to GI clinic at appointment to be                            scheduled after completion of the Esophageal                            Manometry to review results and dicuss ongoing                            management options.                           - If planning any antireflux surgery, based on the  degree of LES laxity, I recommend surgical hernia                            repair/crural repair in addition to fundoplication. Gerrit Heck, MD 08/01/2020 10:52:31 AM

## 2020-08-01 NOTE — Progress Notes (Signed)
Robinol antisialogogue  Lidocaine buffer

## 2020-08-01 NOTE — Progress Notes (Signed)
Pt's states no medical or surgical changes since previsit or office visit. 

## 2020-08-01 NOTE — Progress Notes (Signed)
Called to room to assist during endoscopic procedure.  Patient ID and intended procedure confirmed with present staff. Received instructions for my participation in the procedure from the performing physician.  

## 2020-08-01 NOTE — Patient Instructions (Signed)
Please, read all of the handouts given to you by your recovery room nurse.  YOU HAD AN ENDOSCOPIC PROCEDURE TODAY AT [facility]:   Refer to the procedure report that was given to you for any specific questions about what was found during the examination.  If the procedure report does not answer your questions, please call your gastroenterologist to clarify.  If you requested that your care partner not be given the details of your procedure findings, then the procedure report has been included in a sealed envelope for you to review at your convenience later.  YOU SHOULD EXPECT: Some feelings of bloating in the abdomen. Passage of more gas than usual.  Walking can help get rid of the air that was put into your GI tract during the procedure and reduce the bloating.   Please Note:  You might notice some irritation and congestion in your nose or some drainage.  This is from the oxygen used during your procedure.  There is no need for concern and it should clear up in a day or so.  SYMPTOMS TO REPORT IMMEDIATELY:   Following upper endoscopy (EGD)  Vomiting of blood or coffee ground material  New chest pain or pain under the shoulder blades  Painful or persistently difficult swallowing  New shortness of breath  Fever of 100F or higher  Black, tarry-looking stools  For urgent or emergent issues, a gastroenterologist can be reached at any hour by calling [phone]. Do not use MyChart messaging for urgent concerns.    DIET:  We do recommend a small meal at first, but then you may proceed to your regular diet.  Drink plenty of fluids but you should avoid alcoholic beverages for 24 hours.  ACTIVITY:  You should plan to take it easy for the rest of today and you should NOT DRIVE or use heavy machinery until tomorrow (because of the sedation medicines used during the test).    FOLLOW UP: Our staff will call the number listed on your records 48-72 hours following your procedure  to check on you and address any questions or concerns that you may have regarding the information given to you following your procedure. If we do not reach you, we will leave a message.  We will attempt to reach you two times.  During this call, we will ask if you have developed any symptoms of COVID 19. If you develop any symptoms (ie: fever, flu-like symptoms, shortness of breath, cough etc.) before then, please call [phone].  If you test positive for Covid 19 in the 2 weeks post procedure, please call and report this information to [place].    If any biopsies were taken you will be contacted by phone or by letter within the next 1-3 weeks.  Please call us at [phone] if you have not heard about the biopsies in 3 weeks.    SIGNATURES/CONFIDENTIALITY: You and/or your care partner have signed paperwork which will be entered into your electronic medical record.  These signatures attest to the fact that that the information above on your After Visit Summary has been reviewed and is understood.  Full responsibility of the confidentiality of this discharge information lies with you and/or your care-partner.

## 2020-08-05 ENCOUNTER — Telehealth: Payer: Self-pay

## 2020-08-05 NOTE — Telephone Encounter (Signed)
  Follow up Call-  Call back number 08/01/2020 11/20/2018  Post procedure Call Back phone  # [phone]  Permission to leave phone message Yes Yes  Some recent data might be hidden     Patient questions:  Do you have a fever, pain , or abdominal swelling? No. Pain Score  0 *  Have you tolerated food without any problems? Yes.    Have you been able to return to your normal activities? Yes.    Do you have any questions about your discharge instructions: Diet   No. Medications  No. Follow up visit  No.  Do you have questions or concerns about your Care? No.  Actions: * If pain score is 4 or above: 1. No action needed, pain <4.Have you developed a fever since your procedure? no  2.   Have you had an respiratory symptoms (SOB or cough) since your procedure? no  3.   Have you tested positive for COVID 19 since your procedure no  4.   Have you had any family members/close contacts diagnosed with the COVID 19 since your procedure?  no   If yes to any of these questions please route to Joylene John, RN and Joella Prince, RN

## 2020-08-13 ENCOUNTER — Other Ambulatory Visit: Payer: Self-pay

## 2020-08-13 ENCOUNTER — Ambulatory Visit (INDEPENDENT_AMBULATORY_CARE_PROVIDER_SITE_OTHER): Payer: Medicare HMO | Admitting: Neurology

## 2020-08-13 ENCOUNTER — Encounter: Payer: Self-pay | Admitting: Neurology

## 2020-08-13 VITALS — BP 148/93 | HR 76 | Ht 64.0 in | Wt 82.4 lb

## 2020-08-13 DIAGNOSIS — G629 Polyneuropathy, unspecified: Secondary | ICD-10-CM

## 2020-08-13 DIAGNOSIS — I872 Venous insufficiency (chronic) (peripheral): Secondary | ICD-10-CM

## 2020-08-13 MED ORDER — GABAPENTIN 250 MG/5ML PO SOLN
ORAL | 3 refills | Status: DC
Start: 2020-08-13 — End: 2021-01-12

## 2020-08-13 NOTE — Patient Instructions (Signed)
Good to see you, hang in there. Try increasing gabapentin to 52mL every night. Follow-up in 6 months, call for any changes

## 2020-08-13 NOTE — Progress Notes (Signed)
NEUROLOGY FOLLOW UP OFFICE NOTE  [name] [id number] January 30, 1941  HISTORY OF PRESENT ILLNESS: I had the pleasure of seeing [name] in follow-up in the neurology clinic on 08/13/2020.  The patient was last seen 6 months ago for neuropathy. Records and images were personally reviewed where available.  Since her last visit, she had an EMG at [facility] showing mild sensory neuropathy and negative punch biopsy for small fiber neuropathy, no evidence of vasculitis. She has also been evaluated by Rhuematology for Reynauds disease and by Vascular at [facility] last June 2021. Records were reviewed, she has severe venous insufficiency, mostly in the deep veins. It was discussed how this contributes to majority of her symptoms, including neuropathy. There were severe skin changes compatible with stasis dermatitis and venous stasis. She was prescribed knee high compression stockings.  She continues to have significant stinging in both feet, worse in the evening hours. She has started using the compression stockings which help give her more security but have not relieved her symptoms. She has found mustard helps with cramps. She uses pads on her feet to help takes some pressure off. She has noticed the dorsum of left hand is also starting to have skin changes. She continues to take very low dose gabapentin 50mL qhs of 250mg /35mL. She is now willing to try increasing to 71mL. She denies any falls. She continues to drive.   HPI: This is a pleasant 79 yo RH woman with a history of GERD, nutcracker esophagus, pulmonary fibrosis, in her usual state of health until early June 2017 while walking into a grocery she suddenly had left temporal pain, then it felt like she was hit on both side of the head with a hammer. She reports she was "hit like gangbusters," she was very dizzy with a spinning sensation, nauseated. She held on to the cart for balance and had difficulty concentrating. Her vision was blurred. She  managed to finish her chore and walked to the car, then noticed that she was having a hard time talking. She could see the sentence broken up in her head and could not get the words out. She was able to drive home with no focal symptoms noted. The episode lasted 30 minutes or so, she felt weak and tired after. Since then she has had at least 2 or 3 more episodes of headaches with dizziness, which did not progress to speech difficulties. The symptoms would last around 5 minutes, no clear positional component to the dizziness. When she feels it coming on, she massages her head, which seems to help. She was unsure if Tylenol helped. She denies any prior history of headaches. She denies any vision loss, photo/phonophobia, diplopia, dysarthria, jaw claudication, hearing loss. She has occasional pulsatile tinnitus more in the afternoons. She has GERD which makes swallowing difficult. She has a history of neuropathy affecting both feet, R>L, with burning and numbness/tingling for several years. She has been doing laser therapy for venous insufficiency/varicose veins. She denies any falls but trips more. Sleep varies. She is very active and rides her bike for a couple of hours daily. She works at a preschool. Her mother had a stroke at age 36.   Diagnostic Data:  Venous dopplers showed chronic venous insufficiency, duplex showed limited valvular insufficiency.    EMG/NCV of the right UE and LE confirmed moderate chronic sensorimotor axonal polyneuropathy. There was also note of moderate right ulnar neuropathy with slowing across the elbow and mild right median neuropathy consistent  with carpal tunnel syndrome.    PAST MEDICAL HISTORY: Past Medical History:  Diagnosis Date  . Allergy   . Arthritis   . BOOP (bronchiolitis obliterans with organizing pneumonia) ([place])   . Bronchiectasis   . Cataract   . Esophageal dysmotility   . Gastroparesis   . GERD (gastroesophageal reflux disease)    severe  . Iron  deficiency anemia   . Neuropathy   . Pneumonia   . Raynaud's disease   . Right lower lobe pneumonia December 2013    MEDICATIONS: Current Outpatient Medications on File Prior to Visit  Medication Sig Dispense Refill  . Acetaminophen (TYLENOL EXTRA STRENGTH PO) Take 350 mg by mouth as needed.     . Alum Hydroxide-Mag Carbonate (GAVISCON EXTRA STRENGTH) 160-105 MG CHEW Use as directed (Patient not taking: Reported on 07/18/2020)  0  . benzonatate (TESSALON) 200 MG capsule Take 1 capsule (200 mg total) by mouth as needed for cough. 90 capsule 3  . DEXILANT 60 MG capsule TAKE 1 CAPSULE DAILY 90 capsule 1  . fexofenadine (ALLEGRA) 180 MG tablet Take 180 mg by mouth daily.    [name] gabapentin (NEURONTIN) 250 MG/5ML solution TAKE 2 ML AT BEDTIME. 180 mL 3  . NONFORMULARY OR COMPOUNDED ITEM Peripheral Neuropathy Cream: Bupivacaine 1%, Doxepin 3%, Gabapentin 6%, Pentoxifylline 3%, Topiramate 1% Order faxed to [facility] 1 each 3  . pseudoephedrine-guaifenesin (MUCINEX D) 60-600 MG 12 hr tablet Take by mouth.    . sucralfate (CARAFATE) 1 g tablet TAKE 1 TABLET EVERY 6 HOURS AS NEEDED. MAY DISSOLVE 1 TABLET IN 1 TBSP OF DISTILLED WATER 60 tablet 3  . TURMERIC PO Take 1 tablet by mouth as needed.     No current facility-administered medications on file prior to visit.    ALLERGIES: Allergies  Allergen Reactions  . Cortisone     unsure  . Prednisone   . Sulfasalazine Other (See Comments)    Unsure of reaction  . Sulfonamide Derivatives     Unsure of reaction    FAMILY HISTORY: Family History  Problem Relation Age of Onset  . Lymphoma Mother   . Heart disease Father   . Lung cancer Paternal Uncle   . Heart disease Paternal Grandfather   . Leukemia Paternal Grandmother   . Cancer Maternal Grandmother        unknown type  . Colon cancer Neg Hx   . Esophageal cancer Neg Hx   . Rectal cancer Neg Hx   . Stomach cancer Neg Hx     SOCIAL HISTORY: Social History   Socioeconomic  History  . Marital status: Widowed    Spouse name: Not on file  . Number of children: 2  . Years of education: Not on file  . Highest education level: Not on file  Occupational History  . Occupation: retired Pharmacist, hospital    Comment: still teaching pre K   Tobacco Use  . Smoking status: Former Smoker    Types: Cigarettes    Quit date: 11/15/1968    Years since quitting: 51.7  . Smokeless tobacco: Never Used  . Tobacco comment: 3 cigs a day  Vaping Use  . Vaping Use: Never used  Substance and Sexual Activity  . Alcohol use: No  . Drug use: No  . Sexual activity: Not Currently    Partners: Male  Other Topics Concern  . Not on file  Social History Narrative   Still working as a Print production planner at [employer]. - Retired 35yrs  Widowed      Lives alone   Lives one story townhouse      Right handed.   Social Determinants of Health   Financial Resource Strain:   . Difficulty of Paying Living Expenses: Not on file  Food Insecurity:   . Worried About Charity fundraiser in the Last Year: Not on file  . Ran Out of Food in the Last Year: Not on file  Transportation Needs:   . Lack of Transportation (Medical): Not on file  . Lack of Transportation (Non-Medical): Not on file  Physical Activity:   . Days of Exercise per Week: Not on file  . Minutes of Exercise per Session: Not on file  Stress:   . Feeling of Stress : Not on file  Social Connections:   . Frequency of Communication with Friends and Family: Not on file  . Frequency of Social Gatherings with Friends and Family: Not on file  . Attends Religious Services: Not on file  . Active Member of Clubs or Organizations: Not on file  . Attends Archivist Meetings: Not on file  . Marital Status: Not on file  Intimate Partner Violence:   . Fear of Current or Ex-Partner: Not on file  . Emotionally Abused: Not on file  . Physically Abused: Not on file  . Sexually Abused: Not on file     PHYSICAL  EXAM: Vitals:   08/13/20 1603  BP: (!) 148/93  Pulse: 76  SpO2: 97%   General: No acute distress Head:  Normocephalic/atraumatic Skin/Extremities: Similar chronic discoloration over both feet, more on the toes L>R and heels Neurological Exam: alert and oriented to person, place, and time. No aphasia or dysarthria. Fund of knowledge is appropriate.  Recent and remote memory are intact.  Attention and concentration are normal.   Cranial nerves: Pupils equal, round. Extraocular movements intact with no nystagmus. Visual fields full.  No facial asymmetry.  Motor: Bulk and tone normal, muscle strength 5/5 on both UE, proximal LE. 3+/5 foot dorsiflexion, 4/5 plantar flexion. Finger to nose testing intact.  Gait slow and cautious. Romberg positive.   IMPRESSION: This is a pleasant 79 yo RH woman with a history of GERD, nutcracker esophagus, pulmonary fibrosis, initially seen for transient episode of headache, vertigo, and expressive aphasia, MRI brain no acute changes. Her MRA head and neck was unremarkable except for moderate stenosis of the left P1 and diminished distal flow-related signal within the left PCA distribution. No further episodes since June 2017, continue daily aspirin and control of vascular risk factors. She has mostly been followed in the clinic for neuropathy, she has obtained second opinions from several specialists, skin biopsy at [facility] reportedly negative for small fiber neuropathy. Vascular surgery discussed severe venous insufficiency, mostly in the deep veins. It was discussed how this contributes to majority of her symptoms, including neuropathy. There were severe skin changes compatible with stasis dermatitis and venous stasis. We again discussed symptomatic treatment, she is now agreeable to slightly increasing dose of gabapentin. Discussed driving, there is more foot weakness noted today, she denies any issues, continue to monitor. Follow-up in 6 months, she knows to call for  any changes.   Thank you for allowing me to participate in her care.  Please do not hesitate to call for any questions or concerns.   Ellouise Newer, M.D.   CC: Dr. Felipa Eth

## 2020-08-17 ENCOUNTER — Other Ambulatory Visit: Payer: Self-pay | Admitting: Gastroenterology

## 2020-08-21 ENCOUNTER — Encounter: Payer: Self-pay | Admitting: Gastroenterology

## 2020-08-30 IMAGING — DX DG CHEST 2V
2 series · 2 of 2 positions shown · non-contrast
Comparison: Chest radiograph October 10, 2017; chest CT August 25, 2018

CLINICAL DATA: Shortness of breath.  History of bronchiectasis

EXAM:
CHEST - 2 VIEW

[chest pa]
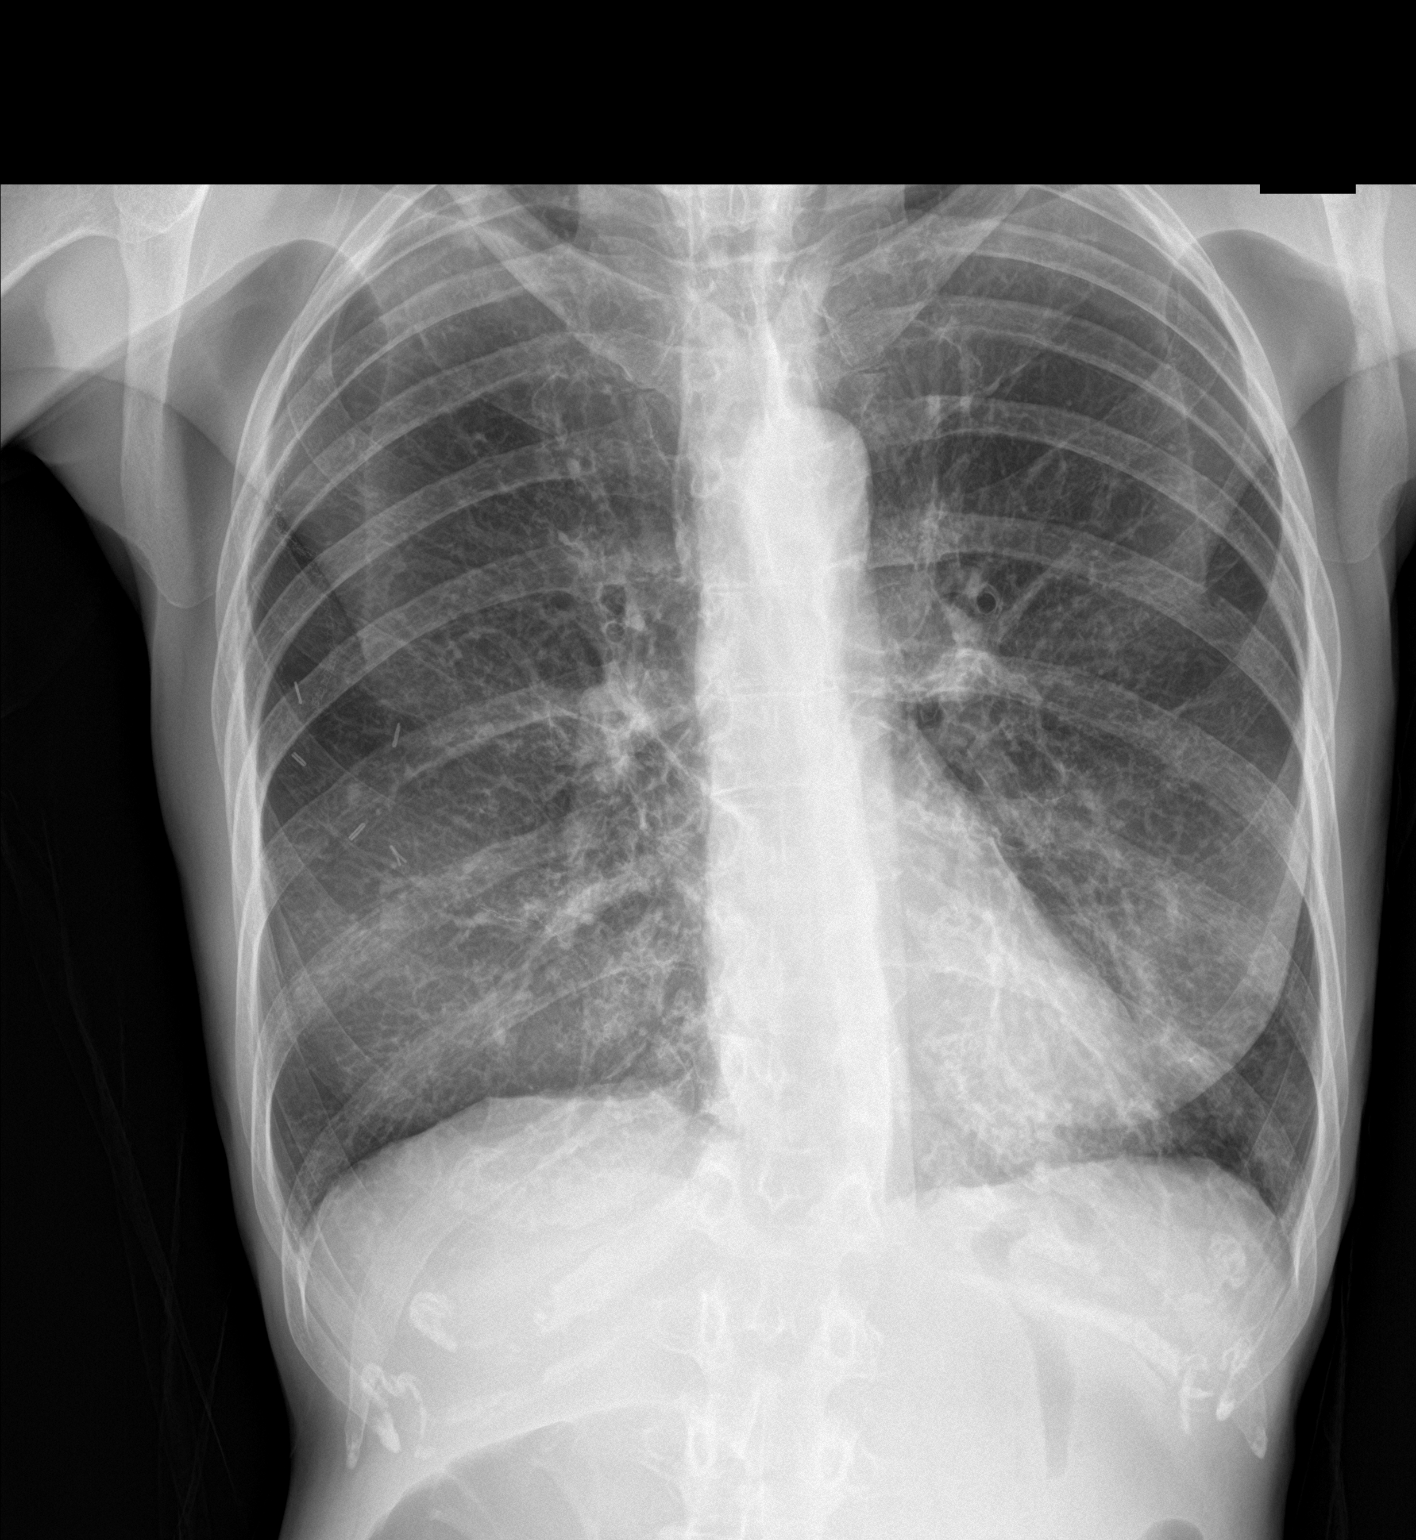

[chest lat]
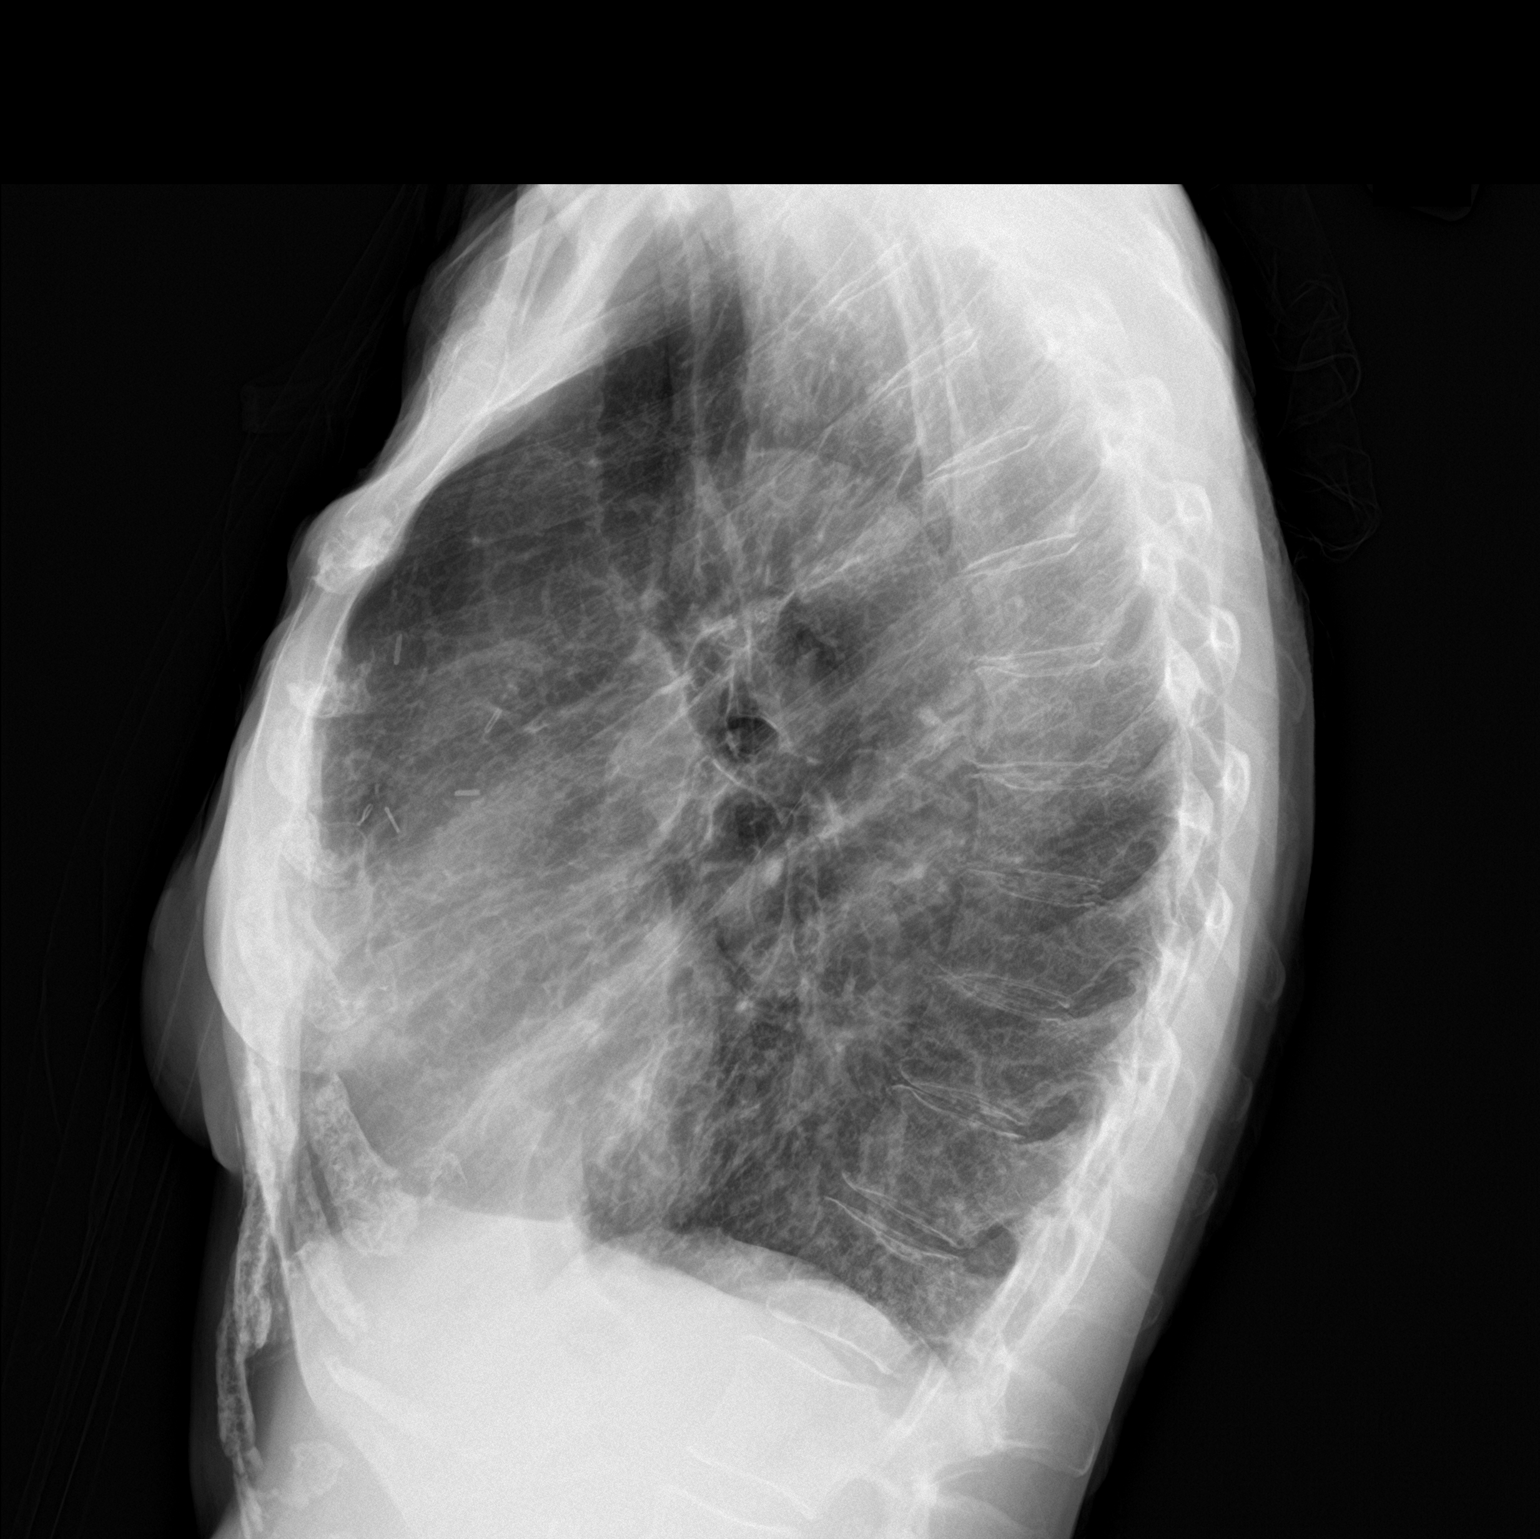

[2 of 2 positions shown; findings below may reference images not displayed]

FINDINGS: Lungs are mildly hyperexpanded. There are areas of fibrosis and
bronchiectatic change in the lung bases; bronchiectasis is better
delineated on CT. There is no frank edema or consolidation. There is
scarring in the medial right mid lung region.

The heart size and pulmonary vascularity within normal limits. No
adenopathy. Surgical clips are noted in the right chest region with
evidence of previous mastectomy. No evident bone lesions.
IMPRESSION: There is a degree of underlying fibrotic type change and
bronchiectasis. Lungs mildly hyperexpanded. No edema or
consolidation.

Cardiac silhouette within normal limits. No adenopathy. Status post
right mastectomy.

## 2020-09-03 ENCOUNTER — Other Ambulatory Visit: Payer: Self-pay

## 2020-09-03 ENCOUNTER — Encounter: Payer: Self-pay | Admitting: Podiatry

## 2020-09-03 ENCOUNTER — Ambulatory Visit (INDEPENDENT_AMBULATORY_CARE_PROVIDER_SITE_OTHER): Payer: Medicare HMO | Admitting: Podiatry

## 2020-09-03 DIAGNOSIS — G629 Polyneuropathy, unspecified: Secondary | ICD-10-CM | POA: Diagnosis not present

## 2020-09-03 DIAGNOSIS — B351 Tinea unguium: Secondary | ICD-10-CM | POA: Diagnosis not present

## 2020-09-03 DIAGNOSIS — L84 Corns and callosities: Secondary | ICD-10-CM

## 2020-09-03 DIAGNOSIS — M79674 Pain in right toe(s): Secondary | ICD-10-CM

## 2020-09-03 DIAGNOSIS — M79675 Pain in left toe(s): Secondary | ICD-10-CM | POA: Diagnosis not present

## 2020-09-03 NOTE — Patient Instructions (Signed)
Purchase Lotrimin Spray powder and apply between toes once daily.  Athlete's Foot  Athlete's foot (tinea pedis) is a fungal infection of the skin on your feet. It often occurs on the skin that is between or underneath the toes. It can also occur on the soles of your feet. The infection can spread from person to person (is contagious). It can also spread when a person's bare feet come in contact with the fungus on shower floors or on items such as shoes. What are the causes? This condition is caused by a fungus that grows in warm, moist places. You can get athlete's foot by sharing shoes, shower stalls, towels, and wet floors with someone who is infected. Not washing your feet or changing your socks often enough can also lead to athlete's foot. What increases the risk? This condition is more likely to develop in:  Men.  People who have a weak body defense system (immune system).  People who have diabetes.  People who use public showers, such as at a gym.  People who wear heavy-duty shoes, such as Environmental manager.  Seasons with warm, humid weather. What are the signs or symptoms? Symptoms of this condition include:  Itchy areas between your toes or on the soles of your feet.  White, flaky, or scaly areas between your toes or on the soles of your feet.  Very itchy small blisters between your toes or on the soles of your feet.  Small cuts in your skin. These cuts can become infected.  Thick or discolored toenails. How is this diagnosed? This condition may be diagnosed with a physical exam and a review of your medical history. Your health care provider may also take a skin or toenail sample to examine under a microscope. How is this treated? This condition is treated with antifungal medicines. These may be applied as powders, ointments, or creams. In severe cases, an oral antifungal medicine may be given. Follow these instructions at home: Medicines  Apply or take  over-the-counter and prescription medicines only as told by your health care provider.  Apply your antifungal medicine as told by your health care provider. Do not stop using the antifungal even if your condition improves. Foot care  Do not scratch your feet.  Keep your feet dry: ? Wear cotton or wool socks. Change your socks every day or if they become wet. ? Wear shoes that allow air to flow, such as sandals or canvas tennis shoes.  Wash and dry your feet, including the area between your toes. Also, wash and dry your feet: ? Every day or as told by your health care provider. ? After exercising. General instructions  Do not let others use towels, shoes, nail clippers, or other personal items that touch your feet.  Protect your feet by wearing sandals in wet areas, such as locker rooms and shared showers.  Keep all follow-up visits as told by your health care provider. This is important.  If you have diabetes, keep your blood sugar under control. Contact a health care provider if:  You have a fever.  You have swelling, soreness, warmth, or redness in your foot.  Your feet are not getting better with treatment.  Your symptoms get worse.  You have new symptoms. Summary  Athlete's foot (tinea pedis) is a fungal infection of the skin on your feet. It often occurs on skin that is between or underneath the toes.  This condition is caused by a fungus that grows in warm,  moist places.  Symptoms include white, flaky, or scaly areas between your toes or on the soles of your feet.  This condition is treated with antifungal medicines.  Keep your feet clean. Always dry them thoroughly. This information is not intended to replace advice given to you by your health care provider. Make sure you discuss any questions you have with your health care provider. Document Revised: 10/27/2017 Document Reviewed: 08/22/2017 Elsevier Patient Education  [place].

## 2020-09-04 DIAGNOSIS — Z1231 Encounter for screening mammogram for malignant neoplasm of breast: Secondary | ICD-10-CM | POA: Diagnosis not present

## 2020-09-07 NOTE — Progress Notes (Signed)
Subjective: [name] presents today for follow up of at risk foot care with history of peripheral neuropathy and corn(s) left 3rd digit and painful mycotic toenails b/l that are difficult to trim. Pain interferes with ambulation. Aggravating factors include wearing enclosed shoe gear. Pain is relieved with periodic professional debridement.   She continues to use the detoxifying foot pads daily  She voices no new pedal problems on today's visit.  Allergies  Allergen Reactions  . Cortisone     unsure  . Prednisone   . Sulfasalazine Other (See Comments)    Unsure of reaction  . Sulfonamide Derivatives     Unsure of reaction     Objective: There were no vitals filed for this visit.  Pt is a pleasant 79 y.o. Caucasian female , WD, WN in NAD. AAO x 3.   Vascular Examination:  Capillary refill time to digits immediate b/l. Palpable DP pulses b/l. Palpable PT pulses b/l. Pedal hair sparse b/l. Skin temperature gradient within normal limits b/l.  Dermatological Examination: Pedal skin is thin shiny, atrophic b/l lower extremities. No open wounds bilaterally. No interdigital macerations bilaterally. Toenails 1-5 b/l elongated, discolored, dystrophic, thickened, crumbly with subungual debris and tenderness to dorsal palpation. Hyperkeratotic lesion(s) L 3rd toe.  No erythema, no edema, no drainage, no flocculence.  Musculoskeletal: Normal muscle strength 5/5 to all lower extremity muscle groups bilaterally. No pain crepitus or joint limitation noted with ROM b/l. Hammertoes noted to the 2-5 bilaterally. She has pain along Achilles tendon cord with equinus noted b/l. Also has tenderness at insertion b/l. No erythema, no warmth, no edema along cord.   Neurological: Protective sensation decreased with 10 gram monofilament b/l. Vibratory sensation intact b/l. Proprioception intact bilaterally.  Assessment: 1. Pain due to onychomycosis of toenails of both feet   2. Corns   3. Neuropathy      Plan: -Toenails 1-5 b/l were debrided in length and girth with sterile nail nippers and dremel without iatrogenic bleeding.  -Corn(s) L 3rd toe pared utilizing sterile scalpel blade without complication or incident. Total number debrided=1. -Patient to continue soft, supportive shoe gear daily. -Patient to report any pedal injuries to medical professional immediately. -Patient/POA to call should there be question/concern in the interim.  Return in about 3 months (around 12/04/2020).

## 2020-09-09 ENCOUNTER — Ambulatory Visit: Payer: Medicare HMO | Admitting: Podiatry

## 2020-10-07 ENCOUNTER — Telehealth: Payer: Self-pay | Admitting: General Surgery

## 2020-10-07 NOTE — Telephone Encounter (Signed)
-----   Message from Karena Addison, [place] sent at 09/18/2020  2:54 PM EDT -----  ----- Message ----- From: Lowell Guitar, RMA Sent: 09/15/2020 To: Lowell Guitar, RMA  Patient wants to wait to  have esophageal manometry til first of the year or 10/15/20 if that slot is opened.

## 2020-10-10 ENCOUNTER — Inpatient Hospital Stay (HOSPITAL_COMMUNITY): Admission: RE | Admit: 2020-10-10 | Payer: Medicare HMO | Source: Ambulatory Visit

## 2020-10-11 ENCOUNTER — Other Ambulatory Visit (HOSPITAL_COMMUNITY)
Admission: RE | Admit: 2020-10-11 | Discharge: 2020-10-11 | Disposition: A | Discharge status: Home / Self Care | Payer: Medicare HMO | Source: Ambulatory Visit | Attending: Gastroenterology | Admitting: Gastroenterology

## 2020-10-11 DIAGNOSIS — Z01812 Encounter for preprocedural laboratory examination: Secondary | ICD-10-CM | POA: Insufficient documentation

## 2020-10-11 DIAGNOSIS — Z20822 Contact with and (suspected) exposure to covid-19: Secondary | ICD-10-CM | POA: Insufficient documentation

## 2020-10-12 LAB — SARS CORONAVIRUS 2 (TAT 6-24 HRS): SARS Coronavirus 2: NEGATIVE

## 2020-10-15 ENCOUNTER — Encounter (HOSPITAL_COMMUNITY)
Admission: RE | Disposition: A | Discharge status: Home / Self Care | Payer: Self-pay | Source: Ambulatory Visit | Attending: Gastroenterology

## 2020-10-15 ENCOUNTER — Ambulatory Visit (HOSPITAL_COMMUNITY)
Admission: RE | Admit: 2020-10-15 | Discharge: 2020-10-15 | Disposition: A | Discharge status: Home / Self Care | Payer: Medicare HMO | Source: Ambulatory Visit | Attending: Gastroenterology | Admitting: Gastroenterology

## 2020-10-15 DIAGNOSIS — K219 Gastro-esophageal reflux disease without esophagitis: Secondary | ICD-10-CM | POA: Insufficient documentation

## 2020-10-15 DIAGNOSIS — R1310 Dysphagia, unspecified: Secondary | ICD-10-CM | POA: Insufficient documentation

## 2020-10-15 DIAGNOSIS — K2100 Gastro-esophageal reflux disease with esophagitis, without bleeding: Secondary | ICD-10-CM

## 2020-10-15 DIAGNOSIS — R1319 Other dysphagia: Secondary | ICD-10-CM | POA: Diagnosis not present

## 2020-10-15 HISTORY — PX: ESOPHAGEAL MANOMETRY: SHX5429

## 2020-10-15 SURGERY — MANOMETRY, ESOPHAGUS

## 2020-10-15 MED ORDER — LIDOCAINE VISCOUS HCL 2 % MT SOLN
OROMUCOSAL | Status: AC
  Filled 2020-10-15: qty 15

## 2020-10-15 SURGICAL SUPPLY — 2 items
FACESHIELD LNG OPTICON STERILE (SAFETY) IMPLANT
GLOVE BIO SURGEON STRL SZ8 (GLOVE) ×4 IMPLANT

## 2020-10-15 NOTE — Progress Notes (Signed)
Esophageal manometry performed per protocol.  Patient tolerated well.  Report to be sent to Dr. Kavitha Nandigam 

## 2020-10-16 ENCOUNTER — Encounter: Payer: Self-pay | Admitting: Cardiovascular Disease

## 2020-10-16 ENCOUNTER — Other Ambulatory Visit: Payer: Self-pay

## 2020-10-16 ENCOUNTER — Ambulatory Visit (INDEPENDENT_AMBULATORY_CARE_PROVIDER_SITE_OTHER): Payer: Medicare HMO | Admitting: Cardiovascular Disease

## 2020-10-16 DIAGNOSIS — I2510 Atherosclerotic heart disease of native coronary artery without angina pectoris: Secondary | ICD-10-CM | POA: Diagnosis not present

## 2020-10-16 DIAGNOSIS — J479 Bronchiectasis, uncomplicated: Secondary | ICD-10-CM | POA: Diagnosis not present

## 2020-10-16 DIAGNOSIS — I5189 Other ill-defined heart diseases: Secondary | ICD-10-CM

## 2020-10-16 DIAGNOSIS — I700 Atherosclerosis of aorta: Secondary | ICD-10-CM | POA: Diagnosis not present

## 2020-10-16 DIAGNOSIS — I872 Venous insufficiency (chronic) (peripheral): Secondary | ICD-10-CM | POA: Diagnosis not present

## 2020-10-16 DIAGNOSIS — R0600 Dyspnea, unspecified: Secondary | ICD-10-CM | POA: Diagnosis not present

## 2020-10-16 DIAGNOSIS — R0609 Other forms of dyspnea: Secondary | ICD-10-CM

## 2020-10-16 DIAGNOSIS — I10 Essential (primary) hypertension: Secondary | ICD-10-CM

## 2020-10-16 DIAGNOSIS — I2584 Coronary atherosclerosis due to calcified coronary lesion: Secondary | ICD-10-CM

## 2020-10-16 NOTE — Progress Notes (Signed)
Cardiology Office Note    Date:  10/18/2020   ID:  Radiance, Deady 04-07-1941, MRN [id number]  PCP:  [name], MD  Cardiologist:  Shelva Majestic, MD   F/U cardiology evaluation, referred through the courtesy of Dr. Chesley Mires  History of Present Illness:  [name] is a 79 y.o. female who was followed by Dr. Halford Chessman for bronchiectasis and has developed progressive symptoms of dyspnea.  A recent CT scan suggested coronary calcification and she is now referred for cardiology evaluation.  I saw her for initial evaluation in November 2020 and initial follow-up in January 2021.  She presents for 64-monthfollow-up evaluation.  Ms. JCominshas a history of remote tobacco use and has been diagnosed with bronchiectasis.  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  She has a history of recurrent aspiration pneumonia in the setting of gastroesophageal reflux.  She has had recurrent episodes of respiratory infection with increased sputum production.  She has developed progressive dyspnea and recently had developed some symptoms of chest discomfort.  She recently underwent a high-resolution chest CT which showed progression of bronchiectasis and also demonstrated coronary calcification.  Because of concerns of potential ischemic etiology to her dyspnea she is now referred for cardiology evaluation.  In 2016, she was seen by PDorris Carnes MD after experiencing an episode of left-sided chest pain radiating to her left arm.  She felt her pain was very atypical.  It was felt most likely to be noncardiac in etiology.  Of note, an echo Doppler study at that time showed an EF of 55 to 60% and was without wall motion abnormality.  There was a suggestion of a possible PFO involving the atrial septum.  In 2017, follow-up echo Doppler study showed EF at 60 to 65% with grade 2 diastolic dysfunction,  mild MR, and again there was noted a small patent foramen ovale with left-to-right flow by color Doppler.  There was mild TR.  Estimated PA pressure was 27 mm.  I saw her for initial evaluation on  September 28, 2019.  At that time she stated that she can ride a stationary bike slowly.  She does not exert herself with walking due to significant dyspnea.  She also  had issues with dependent rubor to her lower extremities with varicosities.  She has never been evaluated for lower extremity venous insufficiency.  She has had progressive issues with peripheral neuropathy and  [place] be undergoing an [place] in [place]  With her progressive exertional dyspnea I recommended a 3-year follow-up echo Doppler study.  She had significant dependent rubor to her feet bilaterally which resolved with supine position her leg elevation.  I discussed undergoing a lower extremity Doppler evaluation to assess for venous insufficiency but she preferred to defer this since [place]  With evidence for coronary atherosclerosis and coronary calcification on her CT I recommended coronary CT angiography for further evaluation.  She underwent an echo Doppler study October 08, 2019.  EF was 60 to 65%.  There was mild LVH and grade 2 diastolic dysfunction.  There was mild MR and mild TR.  Pulmonary pressures normal.  Coronary CTA showed a calcium score of 18.2 representing 35th percentile for age and sex matched control.  There was no evidence for significant obstructive CAD with minimal less than 25% calcified plaque  in the proximal mid and distal LAD.  Over read CT of the chest showed stable bibasilar emphysema and pleural parenchymal scarring with mild bronchiectasis.  I last saw her in January 2021 and she was  evaluated by Dr. Havery Moros of GI for reflux hiatal hernia and esophageal abnormalities.  She does experience some occasional swelling left foot great record has had issues with  neuropathy and is undergoing evaluation at [facility].  I reviewed her coronary CTA from December 2020 as well as her 2D echo Doppler study.  During that evaluation, her blood pressure was mildly increased based on new hypertensive guidelines and with her grade 2 diastolic dysfunction I recommended the addition of low-dose spironolactone 12.5 mg which would be beneficial both for blood pressure, diastolic dysfunction as well as lower extremity edema.  Apparently, [name] never instituted spironolactone.  She denies any chest pain.  She does admit to some shortness of breath.  She has had issues with her hiatal hernia and she may ultimately require surgery.  She states she is not bothered by high blood pressure or chest pain.  She presents for reevaluation.  Past Medical History:  Diagnosis Date  . Allergy   . Arthritis   . BOOP (bronchiolitis obliterans with organizing pneumonia) ([place])   . Bronchiectasis   . Cataract   . Esophageal dysmotility   . Gastroparesis   . GERD (gastroesophageal reflux disease)    severe  . Iron deficiency anemia   . Neuropathy   . Pneumonia   . Raynaud's disease   . Right lower lobe pneumonia December 2013    Past Surgical History:  Procedure Laterality Date  . [place] STUDY N/A 10/13/2015   Procedure: [place] STUDY;  Surgeon: Gatha Mayer, MD;  Location: [facility];  Service: Endoscopy;  Laterality: N/A;  . BREAST LUMPECTOMY     left, x 2  . CATARACT EXTRACTION     both eyes  . CHOLECYSTECTOMY  2005  . COLONOSCOPY    . ESOPHAGEAL MANOMETRY N/A 10/06/2015   Procedure: ESOPHAGEAL MANOMETRY (EM);  Surgeon: Manus Gunning, MD;  Location: [facility];  Service: Gastroenterology;  Laterality: N/A;  . ESOPHAGEAL MANOMETRY N/A 03/10/2018   Procedure: ESOPHAGEAL MANOMETRY (EM);  Surgeon: Mauri Pole, MD;  Location: [facility];  Service: Endoscopy;  Laterality: N/A;  . ESOPHAGEAL MANOMETRY N/A 10/15/2020   Procedure: ESOPHAGEAL  MANOMETRY (EM);  Surgeon: Lavena Bullion, DO;  Location: [facility];  Service: Gastroenterology;  Laterality: N/A;  . LUNG BIOPSY     vats right 2003  . TOTAL ABDOMINAL HYSTERECTOMY    . UPPER GASTROINTESTINAL ENDOSCOPY      Current Medications: Outpatient Medications Prior to Visit  Medication Sig Dispense Refill  . Acetaminophen (TYLENOL EXTRA STRENGTH PO) Take 350 mg by mouth as needed.     . benzonatate (TESSALON) 200 MG capsule Take 1 capsule (200 mg total) by mouth as needed for cough. 90 capsule 3  . DEXILANT 60 MG capsule TAKE 1 CAPSULE DAILY 90 capsule 1  . fexofenadine (ALLEGRA) 180 MG tablet Take 180 mg by mouth daily.    [name] gabapentin (NEURONTIN) 250 MG/5ML solution TAKE 2 ML AT BEDTIME. (Patient taking differently: TAKE 2 ML AT BEDTIME. Pt takes as needed) 180 mL 3  . NONFORMULARY OR COMPOUNDED ITEM Peripheral Neuropathy Cream: Bupivacaine 1%, Doxepin 3%, Gabapentin 6%, Pentoxifylline 3%, Topiramate 1% Order faxed to [facility] 1 each 3  . pseudoephedrine-guaifenesin (MUCINEX D) 60-600 MG 12 hr tablet  Take 1 tablet by mouth daily.     . sucralfate (CARAFATE) 1 g tablet TAKE 1 TABLET EVERY 6 HOURS AS NEEDED. MAY DISSOLVE 1 TABLET IN 1 TBSP OF DISTILLED WATER (Patient taking differently: Take 1 g by mouth daily. ) 60 tablet 2  . TURMERIC PO Take 1 tablet by mouth as needed.    . Alum Hydroxide-Mag Carbonate (GAVISCON EXTRA STRENGTH) 160-105 MG CHEW Use as directed  0   No facility-administered medications prior to visit.     Allergies:   Cortisone, Prednisone, Sulfasalazine, and Sulfonamide derivatives   Social History   Socioeconomic History  . Marital status: Widowed    Spouse name: Not on file  . Number of children: 2  . Years of education: Not on file  . Highest education level: Not on file  Occupational History  . Occupation: retired Pharmacist, hospital    Comment: still teaching pre K   Tobacco Use  . Smoking status: Former Smoker    Types: Cigarettes     Quit date: 11/15/1968    Years since quitting: 51.9  . Smokeless tobacco: Never Used  . Tobacco comment: 3 cigs a day  Vaping Use  . Vaping Use: Never used  Substance and Sexual Activity  . Alcohol use: No  . Drug use: No  . Sexual activity: Not Currently    Partners: Male  Other Topics Concern  . Not on file  Social History Narrative   Still working as a Print production planner at [employer]. 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   Social Determinants of Health   Financial Resource Strain:   . Difficulty of Paying Living Expenses: Not on file  Food Insecurity:   . Worried About RCharity fundraiserin the Last Year: Not on file  . Ran Out of Food in the Last Year: Not on file  Transportation Needs:   . Lack of Transportation (Medical): Not on file  . Lack of Transportation (Non-Medical): Not on file  Physical Activity:   . Days of Exercise per Week: Not on file  . Minutes of Exercise per Session: Not on file  Stress:   . Feeling of Stress : Not on file  Social Connections:   . Frequency of Communication with Friends and Family: Not on file  . Frequency of Social Gatherings with Friends and Family: Not on file  . Attends Religious Services: Not on file  . Active Member of Clubs or Organizations: Not on file  . Attends CArchivistMeetings: Not on file  . Marital Status: Not on file    Socially [place]  She previously taught kindergarten.  She retired at age [age]  She rides a stationary bike.  Family History:  The patient's family history includes Cancer in her maternal grandmother; Heart disease in her father and paternal grandfather; Leukemia in her paternal grandmother; Lung cancer in her paternal uncle; Lymphoma in her mother.   Her father died at age 3114and had heart problems.  Mother died at age 3133with leukemia.  ROS General: Negative; No fevers, chills, or night sweats;  HEENT:  Negative; No changes in vision or hearing, sinus congestion, difficulty swallowing Pulmonary: Negative; No cough, wheezing, shortness of breath, hemoptysis Cardiovascular: Progressive exertional dyspnea.  No chest tightness with bike riding on a stationary bike GI: GERD with recurrent aspirations  GU: Negative; No dysuria, hematuria, or difficulty voiding Musculoskeletal: Negative; no myalgias, joint pain, or weakness Hematologic/Oncology: Negative; no easy bruising, bleeding Endocrine: Negative; no heat/cold intolerance; no diabetes Neuro: Negative; no changes in balance, headaches Skin: Positive for dependent rubor Psychiatric: Negative; No behavioral problems, depression Sleep: Negative; No snoring, daytime sleepiness, hypersomnolence, bruxism, restless legs, hypnogognic hallucinations, no cataplexy Other comprehensive 14 point system review is negative.   PHYSICAL EXAM:   VS:  BP (!) 146/80 (BP Location: Left Arm, Patient Position: Sitting)   Pulse 68   Ht '5\' 4"'  (1.626 m)   Wt 88 lb (39.9 kg)   BMI 15.11 kg/m     Repeat blood pressure by me was elevated at 146/86  Wt Readings from Last 3 Encounters:  10/16/20 88 lb (39.9 kg)  08/13/20 82 lb 6.4 oz (37.4 kg)  08/01/20 84 lb (38.1 kg)    General: Alert, oriented, no distress.  Skin: normal turgor, no rashes, warm and dry HEENT: Normocephalic, atraumatic. Pupils equal round and reactive to light; sclera anicteric; extraocular muscles intact;  Nose without nasal septal hypertrophy Mouth/Parynx benign; Mallinpatti scale 3 Neck: No JVD, no carotid bruits; normal carotid upstroke Lungs: clear to ausculatation and percussion; no wheezing or rales Chest wall: without tenderness to palpitation Heart: PMI not displaced, RRR, s1 s2 normal, 1/6 systolic murmur, no diastolic murmur, no rubs, gallops, thrills, or heaves Abdomen: soft, nontender; no hepatosplenomehaly, BS+; abdominal aorta nontender and not dilated by palpation. Back: no  CVA tenderness Pulses 2+ Musculoskeletal: full range of motion, normal strength, no joint deformities Extremities: Mild dependent rubor with trivial ankle edema no clubbing cyanosis, Homan's sign negative  Neurologic: grossly nonfocal; Cranial nerves grossly wnl Psychologic: Normal mood and affect    Studies/Labs Reviewed:   EKG:  EKG is ordered today.  ECG (independently read by me): Normal sinus rhythm at 68 bpm with mild sinus arrhythmia.  Borderline left atrial enlargement.  January 2021 ECG (independently read by me): Normal sinus rhythm at 74 bpm with an occasional PAC.  November 2020 ECG (independently read by me): Normal sinus rhythm at 63 bpm.  No ectopy.  Normal intervals.  No ST segment changes.  Recent Labs: BMP Latest Ref Rng & Units 05/18/2020 10/29/2019 10/19/2019  Glucose 70 - 99 mg/dL 93 86 95  BUN 8 - 23 mg/dL 6(L) 4(L) 4(L)  Creatinine 0.44 - 1.00 mg/dL 0.35(L) 0.56(L) 0.53(L)  BUN/Creat Ratio 12 - 28 - 7(L) 8(L)  Sodium 135 - 145 mmol/L 137 130(L) 127(L)  Potassium 3.5 - 5.1 mmol/L 3.5 5.0 5.2  Chloride 98 - 111 mmol/L 98 91(L) 90(L)  CO2 22 - 32 mmol/L '30 26 22  ' Calcium 8.9 - 10.3 mg/dL 8.7(L) 9.3 9.2     Hepatic Function Latest Ref Rng & Units 10/01/2019 07/12/2019 04/28/2017  Total Protein 6.0 - 8.5 g/dL 6.9 7.1 6.7  Albumin 3.7 - 4.7 g/dL 4.4 - 4.1  AST 0 - 40 IU/L 35 - 29  ALT 0 - 32 IU/L 50(H) - 28  Alk Phosphatase 39 - 117 IU/L 97 - 65  Total Bilirubin 0.0 - 1.2 mg/dL 0.2 - 0.3  Bilirubin, Direct 0.0 - 0.3 mg/dL - - -    CBC Latest Ref Rng & Units 05/18/2020 10/01/2019 04/28/2017  WBC 4.0 - 10.5 K/uL 9.8 9.8 7.4  Hemoglobin 12.0 - 15.0 g/dL 11.2(L) 13.3 11.6(L)  Hematocrit 36 - 46 % 36.1 41.2 36.4  Platelets 150 - 400 K/uL 409(H) 400 392.0   Lab Results  Component Value Date   MCV 87.8 05/18/2020   MCV 91 10/01/2019   MCV 86.4 04/28/2017   Lab Results  Component Value Date   TSH 5.840 (H) 10/01/2019   No results found for:  HGBA1C   BNP No results found for: BNP  ProBNP No results found for: PROBNP   Lipid Panel     Component Value Date/Time   CHOL 187 10/01/2019 0818   TRIG 108 10/01/2019 0818   HDL 81 10/01/2019 0818   CHOLHDL 2.3 10/01/2019 0818   CHOLHDL 3 07/05/2016 0935   VLDL 22.6 07/05/2016 0935   LDLCALC 87 10/01/2019 0818   LABVLDL 19 10/01/2019 0818     RADIOLOGY: No results found.   Additional studies/ records that were reviewed today include: I reviewed the records of Dr. Halford Chessman as well as the patient's prior echo Doppler study from 2017  ------------------------------------------------------------------- ECHO 06/04/2015 Study Conclusions  - Left ventricle: The cavity size was normal. Systolic function was   normal. The estimated ejection fraction was in the range of 55%   to 60%. Wall motion was normal; there were no regional wall   motion abnormalities. - Atrial septum: Probable PFO.  ------------------------------------------------------------------------ ECHO 07/16/2016 Study Conclusions  - Left ventricle: The cavity size was normal. Wall thickness was   normal. Systolic function was normal. The estimated ejection   fraction was in the range of 60% to 65%. Wall motion was normal;   there were no regional wall motion abnormalities. Doppler   parameters are consistent with pseudonormal left ventricular   relaxation (grade 2 diastolic dysfunction). The E/e&' ratio is   between 8-15, suggesting indeterminate LV filling pressure. - Aortic valve: Sclerosis without stenosis. There was no   regurgitation. - Mitral valve: Mildly thickened leaflets . There was mild   regurgitation. - Left atrium: The atrium was at the upper limits of normal in   size. - Atrial septum: There was a small patent foramen ovale with left   to right flow by color doppler. - Tricuspid valve: There was mild regurgitation. - Pulmonary arteries: PA peak pressure: 27 mm Hg (S). - Inferior vena cava:  The vessel was normal in size. The   respirophasic diameter changes were in the normal range (>= 50%),   consistent with normal central venous pressure.  Impressions:  - Compared to a prior study in 2016, the LVEF is higher at 60-65%.   There is a small PFO identified by color doppler.   ECHO IMPRESSIONS: 10/08/2019 1. Left ventricular ejection fraction, by visual estimation, is 60 to  65%. The left ventricle has normal function. Left ventricular septal wall  thickness was mildly increased. Mildly increased left ventricular  posterior wall thickness. There is mildly  increased left ventricular hypertrophy.  2. Left ventricular diastolic parameters are consistent with Grade II  diastolic dysfunction (pseudonormalization).  3. Global right ventricle has normal systolic function.The right  ventricular size is normal. No increase in right ventricular wall  thickness.  4. Left atrial size was normal.  5. Right atrial size was normal.  6. The mitral valve is normal in structure. Mild mitral valve  regurgitation. No evidence of mitral stenosis.  7. The tricuspid valve is normal in structure. Tricuspid valve  regurgitation is mild.  8. The aortic valve is tricuspid. Aortic valve regurgitation is not  visualized. No evidence of aortic valve sclerosis or stenosis.  9. The pulmonic valve was normal in structure. Pulmonic valve  regurgitation is not visualized.  10. Normal pulmonary  artery systolic pressure.  11. The inferior vena cava is normal in size with greater than 50%  respiratory variability, suggesting right atrial pressure of 3 mmHg.    Coronary CTA IMPRESSION: 11/12/2019 1. Coronary calcium score of 18.2. This was 35th percentile for age and sex matched control.  2. Normal coronary origin with right dominance.  3. No evidence of obstructive CAD.  ASSESSMENT:    1. Coronary atherosclerosis due to calcified coronary lesion   2. Aortic atherosclerosis ([place])    3. Essential hypertension   4. DOE (dyspnea on exertion)   5. BRONCHIECTASIS   6. Grade II diastolic dysfunction   7. Chronic venous insufficiency       PLAN:  Ms. Latima Hamza is a 79 year old female who has a history of progressive bronchiectasis with recurrent aspiration and pneumonias, esophageal dysmotility, peripheral neuropathy, who was  found to have aortic atherosclerosis and coronary calcification on a high-resolution chest CT.  She has previously undergone prior echocardiographic evaluation which had shown normal systolic motion and a concern for small PFO.  Her most recent echo Doppler study from October 08, 2019  showed normal LV systolic function with mild LVH, grade 2 diastolic dysfunction, and mild mitral regurgitation.  There was no mention of a PFO.  Her coronary CTA did not demonstrate any significant obstructive disease and she had a coronary calcium score 18 representing 35th percentile for age and sex matched control.  When I saw her in follow-up in January 2021 with her mild blood pressure elevation, grade 2 diastolic dysfunction, as well as mild swelling I recommended initiation of low-dose spironolactone at 12.5 mg daily.  However, she never followed up with my recommendation and did not start therapy.  Her blood pressure today continues to be mildly elevated and I again reviewed the most recent hypertensive guidelines.  She would prefer not to start any other medication.  I recommended that she monitor her blood pressure very closely and again stressed that for every 20/10 increase there is a doubling of cardiovascular risk.  She had undergone esophageal manometry yesterday and has had continued issues with her hiatal hernia and in the future may require surgical intervention.  She has not had repeat laboratory and I have suggested follow-up fasting laboratory.  Her previous chest CT had shown bibasilar stable emphysema with pleural-parenchymal scarring and mild bronchiectasis.   She will be following up with Dr. Halford Chessman in January as well as Dr. Delice Lesch in May 2022 for her neuropathy.  She sees Dr. Felipa Eth for primary care.  I will see her in 1 year for reevaluation or sooner as needed.   Medication Adjustments/Labs and Tests Ordered: Current medicines are reviewed at length with the patient today.  Concerns regarding medicines are outlined above.  Medication changes, Labs and Tests ordered today are listed in the Patient Instructions below. Patient Instructions  Medication Instructions:  No changes *If you need a refill on your cardiac medications before your next appointment, please call your pharmacy*   Lab Work: None ordered If you have labs (blood work) drawn today and your tests are completely normal, you will receive your results only by: [name] MyChart Message (if you have MyChart) OR . A paper copy in the mail If you have any lab test that is abnormal or we need to change your treatment, we will call you to review the results.   Testing/Procedures: None ordered   Follow-Up: At [facility], you and your health needs are our priority.  As part  of our continuing mission to provide you with exceptional heart care, we have created designated Provider Care Teams.  These Care Teams include your primary Cardiologist (physician) and Advanced Practice Providers (APPs -  Physician Assistants and Nurse Practitioners) who all work together to provide you with the care you need, when you need it.  We recommend signing up for the patient portal called "MyChart".  Sign up information is provided on this After Visit Summary.  MyChart is used to connect with patients for Virtual Visits (Telemedicine).  Patients are able to view lab/test results, encounter notes, upcoming appointments, etc.  Non-urgent messages can be sent to your provider as well.   To learn more about what you can do with MyChart, go to [email or web address].    Your next appointment:   12  month(s)  The format for your next appointment:   In Person  Provider:   Shelva Majestic, MD  Your physician wants you to follow-up in: 12 months. You will receive a reminder letter in the mail two months in advance. If you don't receive a letter, please call our office to schedule the follow-up appointment.   Other Instructions None     Signed, Shelva Majestic, MD  10/18/2020 3:26 PM    [facility] [address], [place], [place],   [address] Phone: [phone]

## 2020-10-16 NOTE — Patient Instructions (Signed)
Medication Instructions:  No changes *If you need a refill on your cardiac medications before your next appointment, please call your pharmacy*   Lab Work: None ordered If you have labs (blood work) drawn today and your tests are completely normal, you will receive your results only by: [name] MyChart Message (if you have MyChart) OR . A paper copy in the mail If you have any lab test that is abnormal or we need to change your treatment, we will call you to review the results.   Testing/Procedures: None ordered   Follow-Up: At [facility], you and your health needs are our priority.  As part of our continuing mission to provide you with exceptional heart care, we have created designated Provider Care Teams.  These Care Teams include your primary Cardiologist (physician) and Advanced Practice Providers (APPs -  Physician Assistants and Nurse Practitioners) who all work together to provide you with the care you need, when you need it.  We recommend signing up for the patient portal called "MyChart".  Sign up information is provided on this After Visit Summary.  MyChart is used to connect with patients for Virtual Visits (Telemedicine).  Patients are able to view lab/test results, encounter notes, upcoming appointments, etc.  Non-urgent messages can be sent to your provider as well.   To learn more about what you can do with MyChart, go to [email or web address].    Your next appointment:   12 month(s)  The format for your next appointment:   In Person  Provider:   Shelva Majestic, MD  Your physician wants you to follow-up in: 12 months. You will receive a reminder letter in the mail two months in advance. If you don't receive a letter, please call our office to schedule the follow-up appointment.   Other Instructions None

## 2020-10-16 NOTE — H&P (Signed)
P  Chief Complaint:    GERD, bronchiectasis  GI History: Patient is a79 y.o.femalereferred to me by Dr. Havery Moros for re-evaluation for possible TIF for chronic reflux.   She additionally has a history of bronchiectasis and follows with Dr. Halford Chessman in the Pulmonary Clinic.  Shehas a longstanding history of severe reflux with evaluation as below.She is currently treated with Dexilant 60 mg daily and prn Carafate, but has been noticing increasing reflux symptoms over the last 15 months or so.  No improvement with previous trial of baclofen so this was discontinued.  Evaluated by Dr. Hassell Done of General Surgeryin 2019to discuss possible Nissen fundoplication, but she declined owing to risks of surgery.  Initially seen by me for TIF evaluation 11/2018, but she decided to hold off.  Most recently seen by Dr. Havery Moros last week and was again referred to me to discuss TIF.  Previously endorsed episodic dysphagia to solids but no stricture noted on EGD or esophagrampreviously.EMin 2016 showed what was thought to be Nutcracker Esophagus at the time, but normalmotility on esophagram 2018.Trialed diltiazem without improvement.   Of note, she has a history of chronic bronchiectasis and recurrent pneumonia, thought secondary to ongoing severe reflux.  -EGD 08/2015 - 1cm HH, gastritis without ulceration, HP negative -pH study done in 2016 showingDemeester score of 152.7 but only symptom index of 23% for reflux. Most of reflux was weakly acidic. -Esophageal manometry showed what was thought to be nutcracker esophagusat the time; No swallows with DCI > 8000 - GES normal 2016 -Esophogram 10/10/2017 - small sliding type hiatal hernia, no distal stricture / mass, normal motility -Repeat manometry 03/10/2018 - hypercontractile esophagus, no other significant pathology; single swallow with DCI >8000 -EGD 11/20/2018-no hiatal hernia, Hill grade 2 valve, slightly irregular Z line but no BP, 3 to  4 mm duodenal adenoma removed by cold snare with otherwise normal-appearing stomach and duodenum. -EGD 08/01/2020-Hill grade 4 valve with 1 cm axial hiatal hernia x 4 cm transverse width, mild gastritis  GERD HQLquestionnaire:39/50 (11/2018), 40/50 (07/2020)   Colonoscopy 11/20/18 - The perianal and digital rectal examinations were normal. - The terminal ileum appeared normal. - A 3 mm polyp was found in the ileocecal valve. The polyp was sessile. The polyp was removed with a cold snare. Resection and retrieval were complete. - A 3 mm polyp was found in the rectum. The polyp was sessile. The polyp was removed with a cold snare. Resection and retrieval were complete. - The exam was otherwise without abnormality other than some tortousity. - Biopsies for histology were taken with a cold forceps from the right colon and left colon for evaluation of microscopic colitis given the patient's incidental complaints of persistent loose Stools.  HPI:     Patient is a 79 y.o. female presenting for Esophageal Manometry for preoperative assessment.   Review of systems:     No chest pain, no SOB, no fevers, no urinary sx   Past Medical History:  Diagnosis Date  . Allergy   . Arthritis   . BOOP (bronchiolitis obliterans with organizing pneumonia) ([place])   . Bronchiectasis   . Cataract   . Esophageal dysmotility   . Gastroparesis   . GERD (gastroesophageal reflux disease)    severe  . Iron deficiency anemia   . Neuropathy   . Pneumonia   . Raynaud's disease   . Right lower lobe pneumonia December 2013    Patient's surgical history, family medical history, social history, medications and allergies were all reviewed  in Epic    No current facility-administered medications for this encounter.   Current Outpatient Medications  Medication Sig Dispense Refill  . Acetaminophen (TYLENOL EXTRA STRENGTH PO) Take 350 mg by mouth as needed.     . Alum Hydroxide-Mag Carbonate (GAVISCON EXTRA  STRENGTH) 160-105 MG CHEW Use as directed  0  . benzonatate (TESSALON) 200 MG capsule Take 1 capsule (200 mg total) by mouth as needed for cough. 90 capsule 3  . DEXILANT 60 MG capsule TAKE 1 CAPSULE DAILY 90 capsule 1  . fexofenadine (ALLEGRA) 180 MG tablet Take 180 mg by mouth daily.    [name] gabapentin (NEURONTIN) 250 MG/5ML solution TAKE 2 ML AT BEDTIME. 180 mL 3  . NONFORMULARY OR COMPOUNDED ITEM Peripheral Neuropathy Cream: Bupivacaine 1%, Doxepin 3%, Gabapentin 6%, Pentoxifylline 3%, Topiramate 1% Order faxed to [place] 1 each 3  . pseudoephedrine-guaifenesin (MUCINEX D) 60-600 MG 12 hr tablet Take by mouth.    . sucralfate (CARAFATE) 1 g tablet TAKE 1 TABLET EVERY 6 HOURS AS NEEDED. MAY DISSOLVE 1 TABLET IN 1 TBSP OF DISTILLED WATER 60 tablet 2  . TURMERIC PO Take 1 tablet by mouth as needed.      Physical Exam:     There were no vitals taken for this visit.    IMPRESSION and PLAN:    1) GERD 2) Dysphagia 3) Bronchiectasis 4) History of Recurrent aspiration pneumonia  -Esophageal manometry -Follow-up in the GI clinic to review results          Lavena Bullion ,DO, FACG 10/16/2020, 7:50 AM

## 2020-10-18 ENCOUNTER — Encounter: Payer: Self-pay | Admitting: Cardiovascular Disease

## 2020-10-20 ENCOUNTER — Other Ambulatory Visit: Payer: Self-pay | Admitting: Gastroenterology

## 2020-10-24 DIAGNOSIS — J479 Bronchiectasis, uncomplicated: Secondary | ICD-10-CM | POA: Diagnosis not present

## 2020-10-24 DIAGNOSIS — Z1389 Encounter for screening for other disorder: Secondary | ICD-10-CM | POA: Diagnosis not present

## 2020-10-24 DIAGNOSIS — K219 Gastro-esophageal reflux disease without esophagitis: Secondary | ICD-10-CM | POA: Diagnosis not present

## 2020-10-24 DIAGNOSIS — I700 Atherosclerosis of aorta: Secondary | ICD-10-CM | POA: Diagnosis not present

## 2020-10-24 DIAGNOSIS — Z0000 Encounter for general adult medical examination without abnormal findings: Secondary | ICD-10-CM | POA: Diagnosis not present

## 2020-10-24 DIAGNOSIS — J8410 Pulmonary fibrosis, unspecified: Secondary | ICD-10-CM | POA: Diagnosis not present

## 2020-10-24 DIAGNOSIS — L299 Pruritus, unspecified: Secondary | ICD-10-CM | POA: Diagnosis not present

## 2020-10-24 DIAGNOSIS — K623 Rectal prolapse: Secondary | ICD-10-CM | POA: Diagnosis not present

## 2020-10-24 DIAGNOSIS — R1030 Lower abdominal pain, unspecified: Secondary | ICD-10-CM | POA: Diagnosis not present

## 2020-10-24 DIAGNOSIS — G603 Idiopathic progressive neuropathy: Secondary | ICD-10-CM | POA: Diagnosis not present

## 2020-10-24 DIAGNOSIS — Z79899 Other long term (current) drug therapy: Secondary | ICD-10-CM | POA: Diagnosis not present

## 2020-10-27 DIAGNOSIS — I700 Atherosclerosis of aorta: Secondary | ICD-10-CM | POA: Diagnosis not present

## 2020-10-27 DIAGNOSIS — Z79899 Other long term (current) drug therapy: Secondary | ICD-10-CM | POA: Diagnosis not present

## 2020-10-28 DIAGNOSIS — R69 Illness, unspecified: Secondary | ICD-10-CM | POA: Diagnosis not present

## 2020-11-03 ENCOUNTER — Other Ambulatory Visit (HOSPITAL_COMMUNITY): Payer: Self-pay | Admitting: *Deleted

## 2020-11-04 ENCOUNTER — Ambulatory Visit (HOSPITAL_COMMUNITY)
Admission: RE | Admit: 2020-11-04 | Discharge: 2020-11-04 | Disposition: A | Discharge status: Home / Self Care | Payer: Medicare HMO | Source: Ambulatory Visit | Attending: Geriatric Medicine | Admitting: Geriatric Medicine

## 2020-11-04 ENCOUNTER — Other Ambulatory Visit: Payer: Self-pay

## 2020-11-04 DIAGNOSIS — D649 Anemia, unspecified: Secondary | ICD-10-CM | POA: Insufficient documentation

## 2020-11-04 MED ORDER — SODIUM CHLORIDE 0.9 % IV SOLN
510.0000 mg | Freq: Once | INTRAVENOUS | Status: AC
  Administered 2020-11-04: 510 mg via INTRAVENOUS
  Filled 2020-11-04: qty 510

## 2020-11-12 DIAGNOSIS — Z1159 Encounter for screening for other viral diseases: Secondary | ICD-10-CM | POA: Diagnosis not present

## 2020-11-13 DIAGNOSIS — Z1159 Encounter for screening for other viral diseases: Secondary | ICD-10-CM | POA: Diagnosis not present

## 2020-11-13 DIAGNOSIS — R5383 Other fatigue: Secondary | ICD-10-CM | POA: Diagnosis not present

## 2020-11-13 DIAGNOSIS — Z20822 Contact with and (suspected) exposure to covid-19: Secondary | ICD-10-CM | POA: Diagnosis not present

## 2020-11-18 ENCOUNTER — Telehealth: Payer: Self-pay | Admitting: Gastroenterology

## 2020-11-18 NOTE — Telephone Encounter (Signed)
Can you please review patient's esophageal manometry results and any recommendations.

## 2020-11-18 NOTE — Progress Notes (Signed)
Spoke to patient to inform her of esophageal manometry results and recommendations. An appointment has been made to discuss reflux options.

## 2020-11-18 NOTE — Telephone Encounter (Signed)
Pt called looking for her tests results. Pt expressed her frustration about not receiving a call from [place] with her results. She would like to know her results and the next steps in her treatment. Pls call her.

## 2020-11-18 NOTE — Telephone Encounter (Signed)
Results reviewed. Please see separate result note that was forwarded regarding the Esophageal Manometry results. Plan for follow-up appointment in the office to review all of this in detail and again go through the pros/cons of antireflux surgical options, namely TIF.

## 2020-11-24 ENCOUNTER — Other Ambulatory Visit: Payer: Self-pay

## 2020-11-24 ENCOUNTER — Ambulatory Visit: Payer: Medicare HMO | Admitting: Pulmonary Disease

## 2020-11-24 ENCOUNTER — Encounter: Payer: Self-pay | Admitting: Adult Health

## 2020-11-24 ENCOUNTER — Ambulatory Visit (INDEPENDENT_AMBULATORY_CARE_PROVIDER_SITE_OTHER): Payer: Medicare HMO

## 2020-11-24 ENCOUNTER — Ambulatory Visit (INDEPENDENT_AMBULATORY_CARE_PROVIDER_SITE_OTHER): Payer: Medicare HMO | Admitting: Adult Health

## 2020-11-24 VITALS — BP 118/70 | HR 86 | Temp 97.4°F | Ht 64.0 in | Wt 87.6 lb

## 2020-11-24 DIAGNOSIS — E46 Unspecified protein-calorie malnutrition: Secondary | ICD-10-CM | POA: Insufficient documentation

## 2020-11-24 DIAGNOSIS — K219 Gastro-esophageal reflux disease without esophagitis: Secondary | ICD-10-CM | POA: Diagnosis not present

## 2020-11-24 DIAGNOSIS — J479 Bronchiectasis, uncomplicated: Secondary | ICD-10-CM | POA: Diagnosis not present

## 2020-11-24 DIAGNOSIS — E43 Unspecified severe protein-calorie malnutrition: Secondary | ICD-10-CM | POA: Diagnosis not present

## 2020-11-24 MED ORDER — AMOXICILLIN-POT CLAVULANATE 875-125 MG PO TABS: 1.0000 | ORAL_TABLET | Freq: Two times a day (BID) | ORAL | 0 refills | Status: AC

## 2020-11-24 NOTE — Progress Notes (Signed)
@Patient  ID: [name], female    DOB: [birth year], 80 y.o.   MRN: [id number]  Chief Complaint  Patient presents with  . Follow-up  . Recurrent Pneumonia    Referring provider: [name], MD  HPI: 80 year old female former smoker followed for bronchiectasis, recurrent aspiration pneumonia with history of reflux and history of cryptogenic organizing pneumonia    TEST/EVENTS :  PFT 12/31/09 >> FEV1 1.84(84%), FEV1% 73, TLC 4.94(98%), DLCO 77%, +BD. PFT 06/21/16 >> FEV1 1.22 (55%), FEV1% 76, TLC 5.29 (104%), DLCO 43% PFT 12/21/16 >> FEV1 1.49 (69%), FEV1% 78, TLC 5.42 (107%), RV 158%, DLCO 51%  CT chest 08/17/10 >> mild biapical scarring, basilar peribronchovascular nodularity and mild bronchiectasis HRCT chest 11/28/17 >> mild centrilobular emphysema, peribronchovascular nodularity, mild BTX, LLL consolidation CT chest 03/02/18 >> mild emphysema, areas of BTX, several nodular areas up to 1.4 cm CT chest 08/25/18 >> centrilobular emphysema, BTX with wall thickening and tree in bud RML/lingula/bilateral lower lobes, unchanged opacity LLL, new patchy opacities LUL HRCT chest 09/21/19 >> apical scarring, diffuse BTX with peribronchovascular nodularity, areas of mucoid impaction, coronary calcification  Echo11/23/20 >> EF 60 to 65%, mild LVH, grade 2 DD, mild MR, mild TR  11/24/2020 Follow up : Bronchiectasis  Patient presents for a follow-up visit.  She complains of ongoing symptoms with cough congestion thick mucus that is difficult to get up.  This has been going on since Thanksgiving.  She denies any fever, chest pain, orthopnea, edema. She was tested for COVID-19 that was negative.  She says symptoms continue to be ongoing.  Has pleuritic pain with deep inspiration and coughing.  Has low energy level.  Appetite is being okay but has chronic nausea.  No vomiting or dysphagia. She denies any weight loss .  Says she has had an occasional nosebleed.  Also says she coughs so hard a  few nights ago that she saw some blood mixed in with her mucus.     Allergies  Allergen Reactions  . Cortisone     unsure  . Prednisone   . Sulfasalazine Other (See Comments)    Unsure of reaction  . Sulfonamide Derivatives     Unsure of reaction    Immunization History  Administered Date(s) Administered  . Influenza Split 11/06/2011, 11/06/2012  . Influenza Whole 10/29/2009, 08/15/2010  . Influenza, High Dose Seasonal PF 12/09/2016, 08/25/2018, 11/22/2018, 08/16/2019  . Influenza,inj,Quad PF,6+ Mos 10/15/2014, 11/22/2014, 11/19/2015  . PFIZER SARS-COV-2 Vaccination 01/01/2020, 02/06/2020  . Pneumococcal Conjugate-13 09/17/2013  . Pneumococcal Polysaccharide-23 10/29/2004, 01/08/2015    Past Medical History:  Diagnosis Date  . Allergy   . Arthritis   . BOOP (bronchiolitis obliterans with organizing pneumonia) ([place])   . Bronchiectasis   . Cataract   . Esophageal dysmotility   . Gastroparesis   . GERD (gastroesophageal reflux disease)    severe  . Iron deficiency anemia   . Neuropathy   . Pneumonia   . Raynaud's disease   . Right lower lobe pneumonia December 2013    Tobacco History: Social History   Tobacco Use  Smoking Status Former Smoker  . Packs/day: 0.25  . Years: 10.00  . Pack years: 2.50  . Types: Cigarettes  . Quit date: 11/15/1968  . Years since quitting: 52.0  Smokeless Tobacco Never Used   Counseling given: Not Answered   Outpatient Medications Prior to Visit  Medication Sig Dispense Refill  . Acetaminophen (TYLENOL EXTRA STRENGTH PO) Take 350 mg by mouth as needed.     [name]  benzonatate (TESSALON) 200 MG capsule Take 1 capsule (200 mg total) by mouth as needed for cough. 90 capsule 3  . DEXILANT 60 MG capsule TAKE 1 CAPSULE DAILY 90 capsule 1  . fexofenadine (ALLEGRA) 180 MG tablet Take 180 mg by mouth daily.    [name] gabapentin (NEURONTIN) 250 MG/5ML solution TAKE 2 ML AT BEDTIME. (Patient taking differently: TAKE 2 ML AT BEDTIME. Pt takes as  needed) 180 mL 3  . NONFORMULARY OR COMPOUNDED ITEM Peripheral Neuropathy Cream: Bupivacaine 1%, Doxepin 3%, Gabapentin 6%, Pentoxifylline 3%, Topiramate 1% Order faxed to [place] 1 each 3  . pseudoephedrine-guaifenesin (MUCINEX D) 60-600 MG 12 hr tablet Take 1 tablet by mouth daily.     . sucralfate (CARAFATE) 1 g tablet TAKE 1 TABLET EVERY 6 HOURS AS NEEDED. MAY DISSOLVE 1 TABLET IN 1 TBSP OF DISTILLED WATER 60 tablet 2  . TURMERIC PO Take 1 tablet by mouth as needed.     No facility-administered medications prior to visit.     Review of Systems:   Constitutional:   No  weight loss, night sweats,  Fevers, chills,+ fatigue, or  lassitude.  HEENT:   No headaches,  Difficulty swallowing,  Tooth/dental problems, or  Sore throat,                No sneezing, itching, ear ache, nasal congestion, post nasal drip,   CV:  No chest pain,  Orthopnea, PND, swelling in lower extremities, anasarca, dizziness, palpitations, syncope.   GI  No heartburn, indigestion, abdominal pain, nausea, vomiting, diarrhea, change in bowel habits, loss of appetite, bloody stools.   Resp:    No chest wall deformity  Skin: no rash or lesions.  GU: no dysuria, change in color of urine, no urgency or frequency.  No flank pain, no hematuria   MS:  No joint pain or swelling.  No decreased range of motion.  No back pain.    Physical Exam  BP 118/70 (BP Location: Left Arm, Cuff Size: Normal)   Pulse 86   Temp (!) 97.4 F (36.3 C) (Temporal)   Ht 5\' 4"  (1.626 m)   Wt 87 lb 9.6 oz (39.7 kg)   SpO2 97% Comment: on RA  BMI 15.04 kg/m   GEN: A/Ox3; pleasant , NAD, thin and cachectic   HEENT:  Apache/AT,   NOSE-clear, THROAT-clear, no lesions, no postnasal drip or exudate noted.   NECK:  Supple w/ fair ROM; no JVD; normal carotid impulses w/o bruits; no thyromegaly or nodules palpated; no lymphadenopathy.    RESP  Clear  P & A; w/o, wheezes/ rales/ or rhonchi. no accessory muscle use, no dullness to  percussion  CARD:  RRR, no m/r/g, no peripheral edema, pulses intact, no cyanosis or clubbing.  GI:   Soft & nt; nml bowel sounds; no organomegaly or masses detected.   Musco: Warm bil, no deformities or joint swelling noted.   Neuro: alert, no focal deficits noted.    Skin: Warm, no lesions or rashes    Lab Results:  BMET  BNP No results found for: BNP  ProBNP No results found for: PROBNP  Imaging: No results found.    PFT Results Latest Ref Rng & Units 12/21/2016 06/21/2016  FVC-Pre L 2.10 1.59  FVC-Predicted Pre % 72 54  FVC-Post L 1.91 1.32  FVC-Predicted Post % 66 45  Pre FEV1/FVC % % 66 76  Post FEV1/FCV % % 78 69  FEV1-Pre L 1.39 1.22  FEV1-Predicted Pre % 64  18  FEV1-Post L 1.49 0.92  DLCO uncorrected ml/min/mmHg 12.47 10.51  DLCO UNC% % 51 43  DLCO corrected ml/min/mmHg 11.89 11.45  DLCO COR %Predicted % 49 47  DLVA Predicted % 72 79  TLC L 5.42 5.29  TLC % Predicted % 107 104  RV % Predicted % 158 164    No results found for: NITRICOXIDE      Assessment & Plan:   BRONCHIECTASIS Slow to resolve bronchiectatic exacerbation.  Check chest x-ray today.  Check sputum culture and sputum AFB.  Treat with antibiotics.  Added mucociliary clearance with Robitussin.  Patient has been intolerant to nebulizers and flutter valve in the past.  Plan  Patient Instructions  Augmentin 875mg  Twice daily  For 1 week. ,take with food.  Robitussin DM As needed  Cough  Chest xray today  Sputum culture and AFB .  Follow up with Dr. Halford Chessman or Donnika Kucher NP In 4-6 weeks and As needed   Please contact office for sooner follow up if symptoms do not improve or worsen or seek emergency care         GERD GERD history along with chronic nausea.   Continue on GERD diet.  Protein calorie malnutrition (HCC) Recommend high-protein diet.  May need a supplement between meals.     Rexene Edison, NP 11/24/2020

## 2020-11-24 NOTE — Assessment & Plan Note (Addendum)
GERD history along with chronic nausea.   Continue on GERD diet.

## 2020-11-24 NOTE — Assessment & Plan Note (Signed)
Recommend high-protein diet.  May need a supplement between meals.

## 2020-11-24 NOTE — Patient Instructions (Addendum)
Augmentin 875mg  Twice daily  For 1 week. ,take with food.  Robitussin DM As needed  Cough  Chest xray today  Sputum culture and AFB .  Follow up with Dr. Halford Chessman or Kwamaine Cuppett NP In 4-6 weeks and As needed   Please contact office for sooner follow up if symptoms do not improve or worsen or seek emergency care

## 2020-11-24 NOTE — Assessment & Plan Note (Signed)
Slow to resolve bronchiectatic exacerbation.  Check chest x-ray today.  Check sputum culture and sputum AFB.  Treat with antibiotics.  Added mucociliary clearance with Robitussin.  Patient has been intolerant to nebulizers and flutter valve in the past.  Plan  Patient Instructions  Augmentin 875mg  Twice daily  For 1 week. ,take with food.  Robitussin DM As needed  Cough  Chest xray today  Sputum culture and AFB .  Follow up with Dr. Halford Chessman or Sudiksha Victor NP In 4-6 weeks and As needed   Please contact office for sooner follow up if symptoms do not improve or worsen or seek emergency care

## 2020-11-25 ENCOUNTER — Telehealth: Payer: Self-pay | Admitting: Adult Health

## 2020-11-25 DIAGNOSIS — J181 Lobar pneumonia, unspecified organism: Secondary | ICD-10-CM

## 2020-11-25 NOTE — Progress Notes (Signed)
Reviewed and agree with assessment/plan.   Vineet Sood, MD [facility] 11/25/2020, 8:39 AM Pager:  [phone]  

## 2020-11-25 NOTE — Progress Notes (Signed)
Called and spoke with patient, provided results and recommendations per Rexene Edison NP.  She verbalized understanding.  Patient scheduled for f/u cxr and ov with Tammy on 1/24 at 2 pm.  Nothing further needed.

## 2020-11-25 NOTE — Telephone Encounter (Signed)
11/25/20  Called and spoke with patient.  Reviewed recommendations from TP NP.  Instructed patient to continue forward with taking Augmentin.  Informed patient to obtain COVID-19 test.  [email or web address]  Also instructed patient that she could call [phone].   Patient reports that she will give this number call in the morning to obtain a COVID test.  Instructed patient to keep follow-up with Dr. Halford Chessman February/2022.  Can consider repeat chest x-ray at that point time.  Will route to TP NP as FYI   Wyn Quaker FNP

## 2020-11-26 ENCOUNTER — Other Ambulatory Visit: Payer: Medicare HMO

## 2020-11-26 DIAGNOSIS — Z20822 Contact with and (suspected) exposure to covid-19: Secondary | ICD-10-CM

## 2020-11-27 ENCOUNTER — Other Ambulatory Visit: Payer: Self-pay

## 2020-11-27 ENCOUNTER — Ambulatory Visit (INDEPENDENT_AMBULATORY_CARE_PROVIDER_SITE_OTHER): Payer: Medicare HMO

## 2020-11-27 ENCOUNTER — Other Ambulatory Visit: Payer: Self-pay | Admitting: Podiatry

## 2020-11-27 ENCOUNTER — Ambulatory Visit (INDEPENDENT_AMBULATORY_CARE_PROVIDER_SITE_OTHER): Payer: Medicare HMO | Admitting: Podiatry

## 2020-11-27 DIAGNOSIS — M76822 Posterior tibial tendinitis, left leg: Secondary | ICD-10-CM

## 2020-11-27 DIAGNOSIS — J479 Bronchiectasis, uncomplicated: Secondary | ICD-10-CM | POA: Insufficient documentation

## 2020-11-27 DIAGNOSIS — M79671 Pain in right foot: Secondary | ICD-10-CM

## 2020-11-27 DIAGNOSIS — G603 Idiopathic progressive neuropathy: Secondary | ICD-10-CM | POA: Insufficient documentation

## 2020-11-27 DIAGNOSIS — E441 Mild protein-calorie malnutrition: Secondary | ICD-10-CM | POA: Insufficient documentation

## 2020-11-27 DIAGNOSIS — M898X9 Other specified disorders of bone, unspecified site: Secondary | ICD-10-CM | POA: Diagnosis not present

## 2020-11-27 DIAGNOSIS — K909 Intestinal malabsorption, unspecified: Secondary | ICD-10-CM | POA: Insufficient documentation

## 2020-11-27 DIAGNOSIS — J8410 Pulmonary fibrosis, unspecified: Secondary | ICD-10-CM | POA: Insufficient documentation

## 2020-11-27 DIAGNOSIS — N3941 Urge incontinence: Secondary | ICD-10-CM | POA: Insufficient documentation

## 2020-11-27 DIAGNOSIS — M79672 Pain in left foot: Secondary | ICD-10-CM

## 2020-11-27 DIAGNOSIS — M722 Plantar fascial fibromatosis: Secondary | ICD-10-CM

## 2020-11-27 DIAGNOSIS — I700 Atherosclerosis of aorta: Secondary | ICD-10-CM | POA: Insufficient documentation

## 2020-11-28 LAB — SARS-COV-2, NAA 2 DAY TAT

## 2020-11-28 LAB — NOVEL CORONAVIRUS, NAA: SARS-CoV-2, NAA: NOT DETECTED

## 2020-11-29 DIAGNOSIS — J471 Bronchiectasis with (acute) exacerbation: Secondary | ICD-10-CM | POA: Diagnosis not present

## 2020-12-02 ENCOUNTER — Encounter: Payer: Self-pay | Admitting: Podiatry

## 2020-12-02 NOTE — Progress Notes (Signed)
Subjective:  Patient ID: [name], female    DOB: [birth year],  MRN: [id number]  Chief Complaint  Patient presents with  . Foot Pain    Bilateral medial foot painful bumps, 2 weeks duration, no known injuries.     80 y.o. female presents with the above complaint. Patient presents with 2 complaints that appears to be very painful in nature.  Patient states on the left foot she is having a lot of pain to the inside part of the foot with navicular exostosis.  Patient states this along the course of posterior tibial tendon.  She also has a complaint of right heel pain as well.  The pain has been going on for quite some time about 2 weeks in duration has progressive gotten worse.  Pain is even the same at rest.  She would like to discuss treatment options she has not seen anyone else prior to seeing me for this particular thing.  She denies any other acute complaints.   Review of Systems: Negative except as noted in the HPI. Denies N/V/F/Ch.  Past Medical History:  Diagnosis Date  . Allergy   . Arthritis   . BOOP (bronchiolitis obliterans with organizing pneumonia) ([place])   . Bronchiectasis   . Cataract   . Esophageal dysmotility   . Gastroparesis   . GERD (gastroesophageal reflux disease)    severe  . Iron deficiency anemia   . Neuropathy   . Pneumonia   . Raynaud's disease   . Right lower lobe pneumonia December 2013    Current Outpatient Medications:  .  Acetaminophen (TYLENOL EXTRA STRENGTH PO), Take 350 mg by mouth as needed. , Disp: , Rfl:  .  benzonatate (TESSALON) 200 MG capsule, Take 1 capsule (200 mg total) by mouth as needed for cough., Disp: 90 capsule, Rfl: 3 .  DEXILANT 60 MG capsule, TAKE 1 CAPSULE DAILY, Disp: 90 capsule, Rfl: 1 .  fexofenadine (ALLEGRA) 180 MG tablet, Take 180 mg by mouth daily., Disp: , Rfl:  .  gabapentin (NEURONTIN) 250 MG/5ML solution, TAKE 2 ML AT BEDTIME. (Patient taking differently: TAKE 2 ML AT BEDTIME. Pt takes as needed), Disp: 180 mL,  Rfl: 3 .  guaiFENesin (MUCINEX) 600 MG 12 hr tablet, 1 tablet as needed, Disp: , Rfl:  .  NONFORMULARY OR COMPOUNDED ITEM, Peripheral Neuropathy Cream: Bupivacaine 1%, Doxepin 3%, Gabapentin 6%, Pentoxifylline 3%, Topiramate 1% Order faxed to [place]: 1 each, Rfl: 3 .  pseudoephedrine-guaifenesin (MUCINEX D) 60-600 MG 12 hr tablet, Take 1 tablet by mouth daily. , Disp: , Rfl:  .  sucralfate (CARAFATE) 1 g tablet, TAKE 1 TABLET EVERY 6 HOURS AS NEEDED. MAY DISSOLVE 1 TABLET IN 1 TBSP OF DISTILLED WATER, Disp: 60 tablet, Rfl: 2 .  TURMERIC PO, Take 1 tablet by mouth as needed., Disp: , Rfl:   Social History   Tobacco Use  Smoking Status Former Smoker  . Packs/day: 0.25  . Years: 10.00  . Pack years: 2.50  . Types: Cigarettes  . Quit date: 11/15/1968  . Years since quitting: 52.0  Smokeless Tobacco Never Used    Allergies  Allergen Reactions  . Cortisone     unsure  . Prednisone   . Sulfasalazine Other (See Comments)    Unsure of reaction  . Sulfonamide Derivatives     Unsure of reaction   Objective:  There were no vitals filed for this visit. There is no height or weight on file to calculate BMI. Constitutional Well developed.  Well nourished.  Vascular Dorsalis pedis pulses palpable bilaterally. Posterior tibial pulses palpable bilaterally. Capillary refill normal to all digits.  No cyanosis or clubbing noted. Pedal hair growth normal.  Neurologic Normal speech. Oriented to person, place, and time. Epicritic sensation to light touch grossly present bilaterally.  Dermatologic Nails well groomed and normal in appearance. No open wounds. No skin lesions.  Orthopedic: Normal joint ROM without pain or crepitus bilaterally. No visible deformities. Tender to palpation at the calcaneal tuber right. No pain with calcaneal squeeze right. Ankle ROM diminished range of motion right. Silfverskiold Test: positive right.   Radiographs: Taken and reviewed. No acute  fractures or dislocations. No evidence of stress fracture.  Plantar heel spur absent. Posterior heel spur absent.  Navicular exostosis noted to both lower extremity  Assessment:   1. Pain in right foot   2. Pain in left foot    Plan:  Patient was evaluated and treated and all questions answered.  Left posterior tibial tendinitis with underlying bony exostosis of the navicular -I explained the patient the etiology of tendinitis and various treatment options were discussed.  Given the amount of pain that she is having which appears to be more on mild to moderate side I believe patient will benefit from a steroid injection.  Patient agrees with the plan would like to proceed with a steroid injection.  I did discuss with the patient there is a risk of rupture associated with it because it is near the tendon.  However patient would like to proceed despite the risks. -A steroid injection was performed at left medial foot a point of maximal tenderness using 1% plain Lidocaine and 10 mg of Kenalog. This was well tolerated.   Plantar Fasciitis, right - XR reviewed as above.  - Educated on icing and stretching. Instructions given.  - Injection delivered to the plantar fascia as below. - DME: Plantar Fascial Brace - Pharmacologic management: None  Procedure: Injection Tendon/Ligament Location: Right plantar fascia at the glabrous junction; medial approach. Skin Prep: alcohol Injectate: 0.5 cc 0.5% marcaine plain, 0.5 cc of 1% Lidocaine, 0.5 cc kenalog 10. Disposition: Patient tolerated procedure well. Injection site dressed with a band-aid.  No follow-ups on file.

## 2020-12-03 DIAGNOSIS — D649 Anemia, unspecified: Secondary | ICD-10-CM | POA: Diagnosis not present

## 2020-12-08 ENCOUNTER — Ambulatory Visit (INDEPENDENT_AMBULATORY_CARE_PROVIDER_SITE_OTHER): Payer: Medicare HMO | Admitting: Adult Health

## 2020-12-08 ENCOUNTER — Ambulatory Visit (INDEPENDENT_AMBULATORY_CARE_PROVIDER_SITE_OTHER): Payer: Medicare HMO | Admitting: Podiatry

## 2020-12-08 ENCOUNTER — Encounter: Payer: Self-pay | Admitting: Adult Health

## 2020-12-08 ENCOUNTER — Other Ambulatory Visit: Payer: Self-pay

## 2020-12-08 ENCOUNTER — Ambulatory Visit (INDEPENDENT_AMBULATORY_CARE_PROVIDER_SITE_OTHER): Payer: Medicare HMO

## 2020-12-08 DIAGNOSIS — J189 Pneumonia, unspecified organism: Secondary | ICD-10-CM | POA: Diagnosis not present

## 2020-12-08 DIAGNOSIS — M79674 Pain in right toe(s): Secondary | ICD-10-CM

## 2020-12-08 DIAGNOSIS — M79675 Pain in left toe(s): Secondary | ICD-10-CM | POA: Diagnosis not present

## 2020-12-08 DIAGNOSIS — L84 Corns and callosities: Secondary | ICD-10-CM | POA: Diagnosis not present

## 2020-12-08 DIAGNOSIS — J181 Lobar pneumonia, unspecified organism: Secondary | ICD-10-CM | POA: Diagnosis not present

## 2020-12-08 DIAGNOSIS — D75839 Thrombocytosis, unspecified: Secondary | ICD-10-CM | POA: Insufficient documentation

## 2020-12-08 DIAGNOSIS — J479 Bronchiectasis, uncomplicated: Secondary | ICD-10-CM | POA: Diagnosis not present

## 2020-12-08 DIAGNOSIS — G629 Polyneuropathy, unspecified: Secondary | ICD-10-CM

## 2020-12-08 DIAGNOSIS — B351 Tinea unguium: Secondary | ICD-10-CM | POA: Diagnosis not present

## 2020-12-08 MED ORDER — DEXLANSOPRAZOLE 60 MG PO CPDR: 1.0000 | DELAYED_RELEASE_CAPSULE | Freq: Every day | ORAL | 3 refills | Status: DC

## 2020-12-08 NOTE — Progress Notes (Signed)
@Patient  ID: [name], female    DOB: 1941-02-23, 80 y.o.   MRN: [id number]  Chief Complaint  Patient presents with  . Pneumonia    Referring provider: [name], MD  HPI: 80 year old female former smoker followed for bronchiectasis, recurrent aspiration pneumonia with history of reflux and history of cryptogenic organizing pneumonia  TEST/EVENTS :  PFT 12/31/09 >> FEV1 1.84(84%), FEV1% 73, TLC 4.94(98%), DLCO 77%, +BD. PFT 06/21/16 >> FEV1 1.22 (55%), FEV1% 76, TLC 5.29 (104%), DLCO 43% PFT 12/21/16 >> FEV1 1.49 (69%), FEV1% 78, TLC 5.42 (107%), RV 158%, DLCO 51%  CT chest 08/17/10 >> mild biapical scarring, basilar peribronchovascular nodularity and mild bronchiectasis HRCT chest 11/28/17 >> mild centrilobular emphysema, peribronchovascular nodularity, mild BTX, LLL consolidation CT chest 03/02/18 >> mild emphysema, areas of BTX, several nodular areas up to 1.4 cm CT chest 08/25/18 >> centrilobular emphysema, BTX with wall thickening and tree in bud RML/lingula/bilateral lower lobes, unchanged opacity LLL, new patchy opacities LUL HRCT chest 09/21/19 >> apical scarring, diffuse BTX with peribronchovascular nodularity, areas of mucoid impaction, coronary calcification  Echo11/23/20 >> EF 60 to 65%, mild LVH, grade 2 DD, mild MR, mild TR  12/08/2020 Follow up ; bronchiectasis and pneumonia Patient presents for 2-week follow-up.  Patient was seen last visit with a slow to resolve bronchiectatic exacerbation.  Patient has been having ongoing cough congestion with thick mucus since Thanksgiving 2021.  COVID-19 testing was negative.  Last visit chest x-ray showed increased peribronchial thickening with new patchy left greater than right lung opacities concerning for possible superimposed pneumonia Patient was treated with a 7-day course of Augmentin.  Since last visit patient is feeling much better.  Cough and congestion have decreased.  Energy level is starting to slowly pick up.   Appetite still remains low but has no nausea vomiting or diarrhea. In the past she has been tried on nebulizers and flutter valve and is been intolerant.  She uses Robitussin for cough and congestion. Chest x-ray today shows persistent lingular infiltrate and increased interstitial markings.   Allergies  Allergen Reactions  . Cortisone     unsure  . Other Other (See Comments)  . Prednisone   . Sulfasalazine Other (See Comments)    Unsure of reaction  . Sulfonamide Derivatives     Unsure of reaction    Immunization History  Administered Date(s) Administered  . Influenza Split 10/26/2010, 11/06/2011, 11/06/2012  . Influenza Whole 10/29/2009, 08/15/2010  . Influenza, High Dose Seasonal PF 12/09/2016, 08/25/2018, 11/22/2018, 08/16/2019, 10/19/2019  . Influenza,inj,Quad PF,6+ Mos 10/15/2014, 11/22/2014, 11/19/2015  . PFIZER(Purple Top)SARS-COV-2 Vaccination 01/01/2020, 01/08/2020, 02/06/2020  . Pneumococcal Conjugate-13 09/17/2013, 09/20/2014  . Pneumococcal Polysaccharide-23 08/28/1992, 08/28/2000, 10/29/2004, 09/14/2013, 01/08/2015  . Tdap 09/13/2011  . Zoster 08/27/2009, 10/19/2019, 04/10/2020    Past Medical History:  Diagnosis Date  . Allergy   . Arthritis   . BOOP (bronchiolitis obliterans with organizing pneumonia) ([place])   . Bronchiectasis   . Cataract   . Esophageal dysmotility   . Gastroparesis   . GERD (gastroesophageal reflux disease)    severe  . Iron deficiency anemia   . Neuropathy   . Pneumonia   . Raynaud's disease   . Right lower lobe pneumonia December 2013    Tobacco History: Social History   Tobacco Use  Smoking Status Former Smoker  . Packs/day: 0.25  . Years: 10.00  . Pack years: 2.50  . Types: Cigarettes  . Quit date: 11/15/1968  . Years since quitting: 52.0  Smokeless  Tobacco Never Used   Counseling given: Not Answered   Outpatient Medications Prior to Visit  Medication Sig Dispense Refill  . Acetaminophen (TYLENOL EXTRA STRENGTH PO)  Take 350 mg by mouth as needed.     . benzonatate (TESSALON) 200 MG capsule Take 1 capsule (200 mg total) by mouth as needed for cough. 90 capsule 3  . calcium-vitamin D (OSCAL WITH D) 500-200 MG-UNIT tablet Take 1 tablet by mouth.    . DEXILANT 60 MG capsule TAKE 1 CAPSULE DAILY 90 capsule 1  . ferrous sulfate 325 (65 FE) MG EC tablet Take 325 mg by mouth 3 (three) times daily with meals.    . Ferrous Sulfate Dried (GNP IRON) 200 (65 Fe) MG TABS Take 65 tablets by mouth daily.    . fexofenadine (ALLEGRA) 180 MG tablet Take 180 mg by mouth daily.    [name] gabapentin (NEURONTIN) 250 MG/5ML solution TAKE 2 ML AT BEDTIME. (Patient taking differently: TAKE 2 ML AT BEDTIME. Pt takes as needed) 180 mL 3  . guaiFENesin (MUCINEX) 600 MG 12 hr tablet 1 tablet as needed    . NONFORMULARY OR COMPOUNDED ITEM Peripheral Neuropathy Cream: Bupivacaine 1%, Doxepin 3%, Gabapentin 6%, Pentoxifylline 3%, Topiramate 1% Order faxed to [place] 1 each 3  . pseudoephedrine-guaifenesin (MUCINEX D) 60-600 MG 12 hr tablet Take 1 tablet by mouth daily.     . sucralfate (CARAFATE) 1 g tablet TAKE 1 TABLET EVERY 6 HOURS AS NEEDED. MAY DISSOLVE 1 TABLET IN 1 TBSP OF DISTILLED WATER 60 tablet 2  . TURMERIC PO Take 1 tablet by mouth as needed.     No facility-administered medications prior to visit.     Review of Systems:   Constitutional:   No  weight loss, night sweats,  Fevers, chills,  +fatigue, or  lassitude.  HEENT:   No headaches,  Difficulty swallowing,  Tooth/dental problems, or  Sore throat,                No sneezing, itching, ear ache, nasal congestion, post nasal drip,   CV:  No chest pain,  Orthopnea, PND, swelling in lower extremities, anasarca, dizziness, palpitations, syncope.   GI  No heartburn, indigestion, abdominal pain, nausea, vomiting, diarrhea, change in bowel habits, loss of appetite, bloody stools.   Resp: .  No chest wall deformity  Skin: no rash or lesions.  GU: no dysuria,  change in color of urine, no urgency or frequency.  No flank pain, no hematuria   MS:  No joint pain or swelling.  No decreased range of motion.  No back pain.    Physical Exam  BP 112/74 (BP Location: Left Arm, Cuff Size: Normal)   Pulse 76   Temp (!) 97 F (36.1 C) (Oral)   Ht 5\' 4"  (1.626 m)   Wt 88 lb 3.2 oz (40 kg)   SpO2 93%   BMI 15.14 kg/m   GEN: A/Ox3; pleasant , NAD, thin elderly female   HEENT:  [place]/AT,  NOSE-clear, THROAT-clear, no lesions, no postnasal drip or exudate noted.   NECK:  Supple w/ fair ROM; no JVD; normal carotid impulses w/o bruits; no thyromegaly or nodules palpated; no lymphadenopathy.    RESP  Clear  P & A; w/o, wheezes/ rales/ or rhonchi. no accessory muscle use, no dullness to percussion  CARD:  RRR, no m/r/g, no peripheral edema, pulses intact, no cyanosis or clubbing.  GI:   Soft & nt; nml bowel sounds; no organomegaly or masses detected.  Musco: Warm bil, no deformities or joint swelling noted.   Neuro: alert, no focal deficits noted.    Skin: Warm, scattered skin abrasions along the forearms and upper arm.     Lab Results:  BNP No results found for: BNP  ProBNP No results found for: PROBNP  Imaging: DG Chest 2 View  Result Date: 12/08/2020 CLINICAL DATA:  Follow-up pneumonia EXAM: CHEST - 2 VIEW COMPARISON:  11/24/2020 FINDINGS: Cardiac shadow is within normal limits. The lungs are hyperinflated but stable. Postsurgical changes in the right breast are noted. Diffuse increased interstitial changes are again identified as well as lingular infiltrates stable from the prior exam. No new focal abnormality is noted. IMPRESSION: Persistent lingular consolidation likely related to mucous plugging as seen on prior chest x-ray and prior chest CT. Continued follow-up is recommended. Electronically Signed   By: Inez Catalina M.D.   On: 12/08/2020 14:06   DG Chest 2 View  Result Date: 11/24/2020 CLINICAL DATA:  Bronchiectasis. EXAM: CHEST - 2  VIEW COMPARISON:  01/28/2020 FINDINGS: The cardiomediastinal silhouette is unchanged with normal heart size. The lungs remain hyperinflated with evidence of underlying bronchiectasis. Diffuse peribronchial thickening has increased, and there is new patchy opacity in the left greater than right mid and lower lungs. No pleural effusion or pneumothorax is identified. No acute osseous abnormality is seen. IMPRESSION: Increased peribronchial thickening with new patchy left greater than right lung opacities concerning for pneumonia. Electronically Signed   By: Logan Bores M.D.   On: 11/24/2020 21:37   DG Foot Complete Left  Result Date: 11/27/2020 Please see detailed radiograph report in office note.  DG Foot Complete Right  Result Date: 11/27/2020 Please see detailed radiograph report in office note. Did have present on [place]    PFT Results Latest Ref Rng & Units 12/21/2016 06/21/2016  FVC-Pre L 2.10 1.59  FVC-Predicted Pre % 72 54  FVC-Post L 1.91 1.32  FVC-Predicted Post % 66 45  Pre FEV1/FVC % % 66 76  Post FEV1/FCV % % 78 69  FEV1-Pre L 1.39 1.22  FEV1-Predicted Pre % 64 55  FEV1-Post L 1.49 0.92  DLCO uncorrected ml/min/mmHg 12.47 10.51  DLCO UNC% % 51 43  DLCO corrected ml/min/mmHg 11.89 11.45  DLCO COR %Predicted % 49 47  DLVA Predicted % 72 79  TLC L 5.42 5.29  TLC % Predicted % 107 104  RV % Predicted % 158 164    No results found for: NITRICOXIDE      Assessment & Plan:   BRONCHIECTASIS Bronchiectasis prone to exacerbations.  Difficult as patient is intolerant to flutter valve and nebulizers.  Have asked her to continue with activities.  Use mucolytic such as Robitussin or Mucinex  Recent exacerbations with probable superimposed pneumonia seems to be slowly improving after antibiotics.  Plan  Patient Instructions  Robitussin DM As needed  Cough  Activity as tolerated.  Follow up with Dr. Halford Chessman or [place] Mielke NP In 4 weeks with chest xray and As needed   Please contact  office for sooner follow up if symptoms do not improve or worsen or seek emergency care         Pneumonia Slow to resolve bronchiectatic exacerbation with superimposed pneumonia.  Chest x-ray shows lingular infiltrates and increased interstitial markings.  Patient is clinically improving.  She will follow-up as planned in 4 weeks with a repeat chest x-ray.  Plan  Patient Instructions  Robitussin DM As needed  Cough  Activity as tolerated.  Follow up with  Dr. Halford Chessman or Austin Herd NP In 4 weeks with chest xray and As needed   Please contact office for sooner follow up if symptoms do not improve or worsen or seek emergency care            Rexene Edison, NP 12/08/2020

## 2020-12-08 NOTE — Assessment & Plan Note (Signed)
Bronchiectasis prone to exacerbations.  Difficult as patient is intolerant to flutter valve and nebulizers.  Have asked her to continue with activities.  Use mucolytic such as Robitussin or Mucinex  Recent exacerbations with probable superimposed pneumonia seems to be slowly improving after antibiotics.  Plan  Patient Instructions  Robitussin DM As needed  Cough  Activity as tolerated.  Follow up with Dr. Halford Chessman or Rishabh Rinkenberger NP In 4 weeks with chest xray and As needed   Please contact office for sooner follow up if symptoms do not improve or worsen or seek emergency care

## 2020-12-08 NOTE — Patient Instructions (Signed)
Robitussin DM As needed  Cough  Activity as tolerated.  Follow up with Dr. Halford Chessman or Tomica Arseneault NP In 4 weeks with chest xray and As needed   Please contact office for sooner follow up if symptoms do not improve or worsen or seek emergency care

## 2020-12-08 NOTE — Assessment & Plan Note (Signed)
Slow to resolve bronchiectatic exacerbation with superimposed pneumonia.  Chest x-ray shows lingular infiltrates and increased interstitial markings.  Patient is clinically improving.  She will follow-up as planned in 4 weeks with a repeat chest x-ray.  Plan  Patient Instructions  Robitussin DM As needed  Cough  Activity as tolerated.  Follow up with Dr. Halford Chessman or Tramya Schoenfelder NP In 4 weeks with chest xray and As needed   Please contact office for sooner follow up if symptoms do not improve or worsen or seek emergency care

## 2020-12-11 ENCOUNTER — Encounter: Payer: Self-pay | Admitting: Gastroenterology

## 2020-12-11 ENCOUNTER — Ambulatory Visit (INDEPENDENT_AMBULATORY_CARE_PROVIDER_SITE_OTHER): Payer: Medicare HMO | Admitting: Gastroenterology

## 2020-12-11 VITALS — BP 106/60 | Ht 64.0 in | Wt 87.4 lb

## 2020-12-11 DIAGNOSIS — J479 Bronchiectasis, uncomplicated: Secondary | ICD-10-CM | POA: Diagnosis not present

## 2020-12-11 DIAGNOSIS — K2100 Gastro-esophageal reflux disease with esophagitis, without bleeding: Secondary | ICD-10-CM | POA: Diagnosis not present

## 2020-12-11 DIAGNOSIS — K449 Diaphragmatic hernia without obstruction or gangrene: Secondary | ICD-10-CM | POA: Diagnosis not present

## 2020-12-11 DIAGNOSIS — R64 Cachexia: Secondary | ICD-10-CM

## 2020-12-11 DIAGNOSIS — R1319 Other dysphagia: Secondary | ICD-10-CM | POA: Diagnosis not present

## 2020-12-11 MED ORDER — FAMOTIDINE 20 MG PO TABS: ORAL_TABLET | ORAL | 5 refills | Status: DC

## 2020-12-11 NOTE — Patient Instructions (Signed)
If you are age 78 or older, your body mass index should be between 23-30. Your Body mass index is 15 kg/m. If this is out of the aforementioned range listed, please consider follow up with your Primary Care Provider.  If you are age 34 or younger, your body mass index should be between 19-25. Your Body mass index is 15 kg/m. If this is out of the aformentioned range listed, please consider follow up with your Primary Care Provider.   We have sent the following medications to your pharmacy for you to pick up at your convenience: Pepcid  Follow up as needed  It was a pleasure to see you today!  Vito Cirigliano, D.O.

## 2020-12-11 NOTE — Progress Notes (Signed)
P  Chief Complaint:    GERD, bronchiectasis  GI History: Patient is a78 y.o.femalereferred to me by Dr. Havery Moros for re-evaluation for possible TIF for chronic reflux.   She additionally has a history of bronchiectasis and follows with Dr. Halford Chessman in the Pulmonary Clinic.  Shehas a longstanding history of severe reflux with evaluation as below.She is currently treated with Dexilant 60 mg daily and prn Carafate, but has been noticing increasing reflux symptoms over the last 15 months or so.  No improvement with previous trial of baclofen so this was discontinued.  Evaluated by Dr. Hassell Done of General Surgeryin 2019to discuss possible Nissen fundoplication, but she declined owing to risks of surgery.  Initially seen by me for TIF evaluation 11/2018, but she decided to hold off.  Most recently seen by Dr. Havery Moros last week and was again referred to me to discuss TIF.  Previously endorsed episodic dysphagia to solids but no stricture noted on EGD or esophagrampreviously.EMin 2016 showed what was thought to be Nutcracker Esophagus at the time, but normalmotility on esophagram 2018.Trialed diltiazem without improvement.   Of note, she has a history of chronic bronchiectasis and recurrent pneumonia, thought secondary to ongoing severe reflux.  -EGD 08/2015 - 1cm HH, gastritis without ulceration, HP negative -pH study done in 2016 showingDemeester score of 152.7 but only symptom index of 23% for reflux. Most of reflux was weakly acidic. -Esophageal manometry showed what was thought to be nutcracker esophagusat the time; No swallows with DCI > 8000 - GES normal 2016 -Esophogram 10/10/2017 - small sliding type hiatal hernia, no distal stricture / mass, normal motility -Repeat manometry 03/10/2018 - hypercontractile esophagus, no other significant pathology; single swallow with DCI >8000 -EGD 11/20/2018-no hiatal hernia, Hill grade 2 valve, slightly irregular Z line but no BP, 3  to 4 mm duodenal adenoma removed by cold snare with otherwise normal-appearing stomach and duodenum. -EGD 07/2020-superficial ulcers in the mid esophagus (path benign), Hill grade 4, 1 cm axial by 4 cm transverse width hiatal hernia, mild gastritis -Esophageal Manometry 10/2020-normal peristalsis, normal resting pressure at EGJ with incomplete relaxation (IRP 17.6, normal <15) c/w some degree of EGJ outflow obstruction, but not achalasia  GERD HQLquestionnaire:39/50 (11/2018), 40/50 (07/2020)   Colonoscopy 11/20/18 - The perianal and digital rectal examinations were normal. - The terminal ileum appeared normal. - A 3 mm polyp was found in the ileocecal valve. The polyp was sessile. The polyp was removed with a cold snare. Resection and retrieval were complete. - A 3 mm polyp was found in the rectum. The polyp was sessile. The polyp was removed with a cold snare. Resection and retrieval were complete. - The exam was otherwise without abnormality other than some tortousity. - Biopsies for histology were taken with a cold forceps from the right colon and left colon for evaluation of microscopic colitis given the patient's incidental complaints of persistent loose Stools.   HPI:     Patient is a 80 y.o. female presenting to the Gastroenterology Clinic for follow-up.  Last seen by me on 07/18/2020 for continued (and worsening) reflux symptoms despite Dexilant, Carafate, OTC antacids.  Additionally with continued, intermittent dysphagia and subsequent weight loss.  Since then, completed repeat EGD and Esophageal Manometry as above.  Still with reflux sxs, predominantly at night, which improves with Carafate. Can have early AM and daytimes sxs.   Was seen in Pulmonary Clinic 12/08/20.  Recently treated with 7-day course of Augmentin for possible superimposed pneumonia.  Uses Robitussin for cough  and congestion.  Was seen in the Cardiology clinic 10/16/2020.  Current BMI 15.14.  Review of  systems:     No chest pain, no SOB, no fevers, no urinary sx   Past Medical History:  Diagnosis Date  . Allergy   . Arthritis   . BOOP (bronchiolitis obliterans with organizing pneumonia) ([place])   . Bronchiectasis   . Cataract   . Esophageal dysmotility   . Gastroparesis   . GERD (gastroesophageal reflux disease)    severe  . Iron deficiency anemia   . Neuropathy   . Pneumonia   . Raynaud's disease   . Right lower lobe pneumonia December 2013    Patient's surgical history, family medical history, social history, medications and allergies were all reviewed in Epic    Current Outpatient Medications  Medication Sig Dispense Refill  . Acetaminophen (TYLENOL EXTRA STRENGTH PO) Take 350 mg by mouth as needed.     . benzonatate (TESSALON) 200 MG capsule Take 1 capsule (200 mg total) by mouth as needed for cough. 90 capsule 3  . calcium-vitamin D (OSCAL WITH D) 500-200 MG-UNIT tablet Take 1 tablet by mouth.    . dexlansoprazole (DEXILANT) 60 MG capsule Take 1 capsule (60 mg total) by mouth daily. 90 capsule 3  . ferrous sulfate 325 (65 FE) MG EC tablet Take 325 mg by mouth 3 (three) times daily with meals.    . Ferrous Sulfate Dried (GNP IRON) 200 (65 Fe) MG TABS Take 65 tablets by mouth daily.    . fexofenadine (ALLEGRA) 180 MG tablet Take 180 mg by mouth daily.    [name] gabapentin (NEURONTIN) 250 MG/5ML solution TAKE 2 ML AT BEDTIME. (Patient taking differently: TAKE 2 ML AT BEDTIME. Pt takes as needed) 180 mL 3  . guaiFENesin (MUCINEX) 600 MG 12 hr tablet 1 tablet as needed    . NONFORMULARY OR COMPOUNDED ITEM Peripheral Neuropathy Cream: Bupivacaine 1%, Doxepin 3%, Gabapentin 6%, Pentoxifylline 3%, Topiramate 1% Order faxed to [place] 1 each 3  . pseudoephedrine-guaifenesin (MUCINEX D) 60-600 MG 12 hr tablet Take 1 tablet by mouth daily.     . sucralfate (CARAFATE) 1 g tablet TAKE 1 TABLET EVERY 6 HOURS AS NEEDED. MAY DISSOLVE 1 TABLET IN 1 TBSP OF DISTILLED WATER 60 tablet  2  . TURMERIC PO Take 1 tablet by mouth as needed.     No current facility-administered medications for this visit.    Physical Exam:     There were no vitals taken for this visit.  GENERAL:  Pleasant female in NAD PSYCH: : Cooperative, normal affect NEURO: Alert and oriented x 3, no focal neurologic deficits   IMPRESSION and PLAN:    1) GERD 2) Dysphagia 3) Bronchiectasis 4) History of recurrent aspiration pneumonia 5) LES laxity/hiatal hernia 6) Cachexia (BMI 15.1)  We had a long conversation today regarding continued management of her reflux.  Reviewed her recent endoscopy and Esophageal Manometry at length.  Discussed reflux with relationship to her underlying bronchiectasis and history of recurrent pneumonia.  Discussed medications, hiatal hernia repair, antireflux surgical options.  Discussed the possibility of hernia repair without fundoplication (or with loose wrap). Ultimately she does not want to proceed with any surgery given age, comorbidities, and concerns about postoperative recovery.  -Continue Dexilant and Carafate -Start Pepcid 20 mg pre-dinner to assist in controlling nocturnal symptoms.  Can uptitrate to either 40 mg or bid dosing if continued daytime breakthrough -As above, per patient preference, no plan for hiatal hernia repair  or antireflux surgery -Can follow-up with me as needed.  Otherwise, she will continue to follow with Dr. Havery Moros in the GI clinic  I spent 40 minutes of time, including in depth chart review, independent review of results as outlined above, communicating results with the patient directly, face-to-face time with the patient, coordinating care, ordering studies and medications as appropriate, and documentation.         Lavena Bullion ,DO, FACG 12/11/2020, 1:23 PM

## 2020-12-13 ENCOUNTER — Encounter: Payer: Self-pay | Admitting: Podiatry

## 2020-12-13 NOTE — Progress Notes (Signed)
Subjective: [name] presents today for follow up of at risk foot care with history of peripheral neuropathy and corn(s) left 3rd digit and painful mycotic toenails b/l that are difficult to trim. Pain interferes with ambulation. Aggravating factors include wearing enclosed shoe gear. Pain is relieved with periodic professional debridement.   She continues to use the detoxifying foot pads daily  She voices no new pedal problems on today's visit.  Allergies  Allergen Reactions  . Cortisone     unsure  . Other Other (See Comments)  . Prednisone   . Sulfasalazine Other (See Comments)    Unsure of reaction  . Sulfonamide Derivatives     Unsure of reaction     Objective: There were no vitals filed for this visit.  Pt is a pleasant 80 y.o. Caucasian female , WD, WN in NAD. AAO x 3.   Vascular Examination:  Capillary refill time to digits immediate b/l. Palpable DP pulses b/l. Palpable PT pulses b/l. Pedal hair sparse b/l. Skin temperature gradient within normal limits b/l.  Dermatological Examination: Pedal skin is thin shiny, atrophic b/l lower extremities. No open wounds bilaterally. No interdigital macerations bilaterally. Toenails 1-5 b/l elongated, discolored, dystrophic, thickened, crumbly with subungual debris and tenderness to dorsal palpation. Hyperkeratotic lesion(s) L 3rd toe.  No erythema, no edema, no drainage, no flocculence.  Musculoskeletal: Normal muscle strength 5/5 to all lower extremity muscle groups bilaterally. No pain crepitus or joint limitation noted with ROM b/l. Hammertoes noted to the 2-5 bilaterally. She has pain along Achilles tendon cord with equinus noted b/l. Also has tenderness at insertion b/l. No erythema, no warmth, no edema along cord.   Neurological: Protective sensation decreased with 10 gram monofilament b/l. Vibratory sensation intact b/l. Proprioception intact bilaterally.  Assessment: 1. Pain due to onychomycosis of toenails of both feet    2. Corns   3. Neuropathy   4. Pain in left toe(s)     Plan: -Toenails 1-5 b/l were debrided in length and girth with sterile nail nippers and dremel without iatrogenic bleeding.  -Corn(s) L 3rd toe pared utilizing sterile scalpel blade without complication or incident. Total number debrided=1. -Patient to continue soft, supportive shoe gear daily. -Patient to report any pedal injuries to medical professional immediately. -Patient/POA to call should there be question/concern in the interim.  Return in about 3 months (around 03/08/2021).

## 2020-12-17 ENCOUNTER — Ambulatory Visit: Payer: Medicare HMO | Admitting: Pulmonary Disease

## 2020-12-18 ENCOUNTER — Other Ambulatory Visit: Payer: Self-pay | Admitting: Gastroenterology

## 2020-12-19 ENCOUNTER — Telehealth: Payer: Self-pay | Admitting: Gastroenterology

## 2020-12-19 NOTE — Telephone Encounter (Signed)
Patient needs to refill Carafate. Please send it to [facility].

## 2020-12-19 NOTE — Telephone Encounter (Signed)
Refill sent to [facility] ?

## 2020-12-22 ENCOUNTER — Other Ambulatory Visit: Payer: Self-pay | Admitting: Pulmonary Disease

## 2020-12-26 ENCOUNTER — Ambulatory Visit: Payer: Medicare HMO | Admitting: Podiatry

## 2021-01-01 ENCOUNTER — Ambulatory Visit (INDEPENDENT_AMBULATORY_CARE_PROVIDER_SITE_OTHER): Payer: Medicare HMO | Admitting: Ophthalmology

## 2021-01-01 ENCOUNTER — Other Ambulatory Visit: Payer: Self-pay

## 2021-01-01 ENCOUNTER — Encounter (INDEPENDENT_AMBULATORY_CARE_PROVIDER_SITE_OTHER): Payer: Self-pay | Admitting: Ophthalmology

## 2021-01-01 DIAGNOSIS — D3132 Benign neoplasm of left choroid: Secondary | ICD-10-CM | POA: Diagnosis not present

## 2021-01-01 DIAGNOSIS — H35372 Puckering of macula, left eye: Secondary | ICD-10-CM | POA: Diagnosis not present

## 2021-01-01 DIAGNOSIS — H353131 Nonexudative age-related macular degeneration, bilateral, early dry stage: Secondary | ICD-10-CM

## 2021-01-01 NOTE — Assessment & Plan Note (Signed)
Minor small hard drusen in the macular region, does not rise to level of intermediate ARMD.  In the mid periphery of the retina there are large soft drusen.,  These typically do not affect the impact of the central vision

## 2021-01-01 NOTE — Assessment & Plan Note (Signed)
Minor OS configurational change of the inner retina yet no outer retinal disturbance, no impact on acuity will observe

## 2021-01-01 NOTE — Progress Notes (Signed)
01/01/2021     CHIEF COMPLAINT Patient presents for Retina Follow Up (9 month f\u OU. OCT/Pt states vision is blurry/ not clear. Pt c/o OU burning and having a slight discharge. Pt state OD feels like it is hard to open. Pt has been using rewetting gtts. Pt has seen more floaters in vision.)   HISTORY OF PRESENT ILLNESS: [name] is a 80 y.o. female who presents to the clinic today for:   HPI    Retina Follow Up    Diagnosis: ERM.  In left eye.  Severity is moderate.  Duration of 9 months.  Since onset it is stable.  I, the attending physician,  performed the HPI with the patient and updated documentation appropriately. Additional comments: 9 month f\u OU. OCT Pt states vision is blurry/ not clear. Pt c/o OU burning and having a slight discharge. Pt state OD feels like it is hard to open. Pt has been using rewetting gtts. Pt has seen more floaters in vision.       Last edited by Tilda Franco on 01/01/2021 11:01 AM. (History)      Referring physician: [name], MD [address],  [place] [id number]  HISTORICAL INFORMATION:   Selected notes from the [place]: No current outpatient medications on file. (Ophthalmic Drugs)   No current facility-administered medications for this visit. (Ophthalmic Drugs)   Current Outpatient Medications (Other)  Medication Sig  . Acetaminophen (TYLENOL EXTRA STRENGTH PO) Take 350 mg by mouth as needed.   . benzonatate (TESSALON) 200 MG capsule TAKE 1 CAPSULE AS NEEDED FOR COUGH.  . calcium-vitamin D (OSCAL WITH D) 500-200 MG-UNIT tablet Take 1 tablet by mouth.  . dexlansoprazole (DEXILANT) 60 MG capsule Take 1 capsule (60 mg total) by mouth daily.  . famotidine (PEPCID) 20 MG tablet Take one tablet before dinner.  . ferrous sulfate 325 (65 FE) MG EC tablet Take 325 mg by mouth 3 (three) times daily with meals.  . Ferrous Sulfate Dried (GNP IRON) 200 (65 Fe) MG TABS Take 65  tablets by mouth daily.  . fexofenadine (ALLEGRA) 180 MG tablet Take 180 mg by mouth daily.  [name] gabapentin (NEURONTIN) 250 MG/5ML solution TAKE 2 ML AT BEDTIME. (Patient taking differently: TAKE 2 ML AT BEDTIME. Pt takes as needed)  . guaiFENesin (MUCINEX) 600 MG 12 hr tablet 1 tablet as needed  . NONFORMULARY OR COMPOUNDED ITEM Peripheral Neuropathy Cream: Bupivacaine 1%, Doxepin 3%, Gabapentin 6%, Pentoxifylline 3%, Topiramate 1% Order faxed to [facility]  . pseudoephedrine-guaifenesin (MUCINEX D) 60-600 MG 12 hr tablet Take 1 tablet by mouth daily.   . sucralfate (CARAFATE) 1 g tablet TAKE 1 TABLET EVERY 6 HOURS AS NEEDED. MAY DISSOLVE 1 TABLET IN 1 TBSP OF DISTILLED WATER  . TURMERIC PO Take 1 tablet by mouth as needed.   No current facility-administered medications for this visit. (Other)      REVIEW OF SYSTEMS:    ALLERGIES Allergies  Allergen Reactions  . Cortisone     unsure  . Other Other (See Comments)  . Prednisone   . Sulfasalazine Other (See Comments)    Unsure of reaction  . Sulfonamide Derivatives     Unsure of reaction    PAST MEDICAL HISTORY Past Medical History:  Diagnosis Date  . Allergy   . Arthritis   . BOOP (bronchiolitis obliterans with organizing pneumonia) ([place])   . Bronchiectasis   .  Cataract   . Esophageal dysmotility   . Gastroparesis   . GERD (gastroesophageal reflux disease)    severe  . Iron deficiency anemia   . Neuropathy   . Pneumonia   . Raynaud's disease   . Right lower lobe pneumonia December 2013   Past Surgical History:  Procedure Laterality Date  . [place] STUDY N/A 10/13/2015   Procedure: [place] STUDY;  Surgeon: Gatha Mayer, MD;  Location: [facility];  Service: Endoscopy;  Laterality: N/A;  . BREAST LUMPECTOMY     left, x 2  . CATARACT EXTRACTION     both eyes  . CHOLECYSTECTOMY  2005  . COLONOSCOPY    . ESOPHAGEAL MANOMETRY N/A 10/06/2015   Procedure: ESOPHAGEAL MANOMETRY (EM);  Surgeon:  Manus Gunning, MD;  Location: [facility];  Service: Gastroenterology;  Laterality: N/A;  . ESOPHAGEAL MANOMETRY N/A 03/10/2018   Procedure: ESOPHAGEAL MANOMETRY (EM);  Surgeon: Mauri Pole, MD;  Location: [facility];  Service: Endoscopy;  Laterality: N/A;  . ESOPHAGEAL MANOMETRY N/A 10/15/2020   Procedure: ESOPHAGEAL MANOMETRY (EM);  Surgeon: Lavena Bullion, DO;  Location: [facility];  Service: Gastroenterology;  Laterality: N/A;  . LUNG BIOPSY     vats right 2003  . TOTAL ABDOMINAL HYSTERECTOMY    . UPPER GASTROINTESTINAL ENDOSCOPY      FAMILY HISTORY Family History  Problem Relation Age of Onset  . Lymphoma Mother   . Heart disease Father   . Lung cancer Paternal Uncle   . Heart disease Paternal Grandfather   . Leukemia Paternal Grandmother   . Cancer Maternal Grandmother        unknown type  . Colon cancer Neg Hx   . Esophageal cancer Neg Hx   . Rectal cancer Neg Hx   . Stomach cancer Neg Hx     SOCIAL HISTORY Social History   Tobacco Use  . Smoking status: Former Smoker    Packs/day: 0.25    Years: 10.00    Pack years: 2.50    Types: Cigarettes    Quit date: 11/15/1968    Years since quitting: 52.1  . Smokeless tobacco: Never Used  Vaping Use  . Vaping Use: Never used  Substance Use Topics  . Alcohol use: No  . Drug use: No         OPHTHALMIC EXAM:  Base Eye Exam    Visual Acuity (Snellen - Linear)      Right Left   Dist cc 20/25 20/25 -1       Tonometry (Tonopen, 11:05 AM)      Right Left   Pressure 12 8       Pupils      Pupils Dark Light Shape React APD   Right PERRL 4 3 Round Brisk None   Left PERRL 4 3 Round Brisk None       Visual Fields (Counting fingers)      Left Right    Full Full       Neuro/Psych    Oriented x3: Yes   Mood/Affect: Normal       Dilation    Both eyes: 1.0% Mydriacyl, 2.5% Phenylephrine @ 11:05 AM        Slit Lamp and Fundus Exam    External Exam      Right Left   External Normal  Normal       Slit Lamp Exam      Right Left   Lids/Lashes Normal Normal  Conjunctiva/Sclera White and quiet White and quiet   Cornea Clear Clear   Anterior Chamber Deep and quiet Deep and quiet   Iris Round and reactive Round and reactive   Lens Posterior chamber intraocular lens Posterior chamber intraocular lens   Anterior Vitreous Normal Normal       Fundus Exam      Right Left   Posterior Vitreous Posterior vitreous detachment Posterior vitreous detachment   Disc Normal Normal   C/D Ratio 0.3 0.25   Macula Hard drusen, no macular thickening, Early age related macular degeneration Epiretinal membrane moderate topographic distortion, Choroidal nevus 1Da, Hard drusen, Epiretinal membrane, mild   Vessels Normal Normal   Periphery Normal Normal          IMAGING AND PROCEDURES  Imaging and Procedures for 01/01/21  OCT, Retina - OU - Both Eyes       Right Eye Quality was good. Scan locations included subfoveal. Central Foveal Thickness: 295. Progression has been stable. Findings include normal observations.   Left Eye Quality was good. Scan locations included subfoveal. Central Foveal Thickness: 359. Progression has been stable. Findings include epiretinal membrane.   Notes OD and OS stable over time,  Epiretinal membrane bridging the foveal umbo OS, no impact on acuity will observe                ASSESSMENT/PLAN:  Left epiretinal membrane Minor OS configurational change of the inner retina yet no outer retinal disturbance, no impact on acuity will observe  Early stage nonexudative age-related macular degeneration of both eyes Minor small hard drusen in the macular region, does not rise to level of intermediate ARMD.  In the mid periphery of the retina there are large soft drusen.,  These typically do not affect the impact of the central vision      ICD-10-CM   1. Left epiretinal membrane  H35.372 OCT, Retina - OU - Both Eyes  2. Choroidal nevus, left eye   D31.32   3. Early stage nonexudative age-related macular degeneration of both eyes  H35.3131     1.  No active retinal or macular findings requiring intervention at this time OU.  2.  Does complain of itching and burning and dryness and occasional blurriness of vision.  This does not like some dry component likely related to the underlying Raynaud's syndrome that she has systemically and possibly contributing to dry eye.  With this condition she is under the care of Dr. Luberta Mutter  3.  Ophthalmic Meds Ordered this visit:  No orders of the defined types were placed in this encounter.      Return in about 1 year (around 01/01/2022) for DILATE OU, COLOR FP, OS, OD, OCT.  There are no Patient Instructions on file for this visit.   Explained the diagnoses, plan, and follow up with the patient and they expressed understanding.  Patient expressed understanding of the importance of proper follow up care.   Clent Demark Fumiye Lubben M.D. Diseases & Surgery of the Retina and Vitreous Retina & Diabetic [place] 01/01/21     Abbreviations: M myopia (nearsighted); A astigmatism; H hyperopia (farsighted); P presbyopia; Mrx spectacle prescription;  CTL contact lenses; OD right eye; OS left eye; OU both eyes  XT exotropia; ET esotropia; PEK punctate epithelial keratitis; PEE punctate epithelial erosions; DES dry eye syndrome; MGD meibomian gland dysfunction; ATs artificial tears; PFAT's preservative free artificial tears; [place] nuclear sclerotic cataract; PSC posterior subcapsular cataract; ERM epi-retinal membrane; PVD posterior vitreous detachment; RD  retinal detachment; DM diabetes mellitus; DR diabetic retinopathy; NPDR non-proliferative diabetic retinopathy; PDR proliferative diabetic retinopathy; CSME clinically significant macular edema; DME diabetic macular edema; dbh dot blot hemorrhages; CWS cotton wool spot; POAG primary open angle glaucoma; C/D cup-to-disc ratio; HVF humphrey visual field;  GVF goldmann visual field; OCT optical coherence tomography; IOP intraocular pressure; BRVO Branch retinal vein occlusion; CRVO central retinal vein occlusion; CRAO central retinal artery occlusion; BRAO branch retinal artery occlusion; RT retinal tear; SB scleral buckle; PPV pars plana vitrectomy; VH Vitreous hemorrhage; PRP panretinal laser photocoagulation; IVK intravitreal kenalog; VMT vitreomacular traction; MH Macular hole;  NVD neovascularization of the disc; NVE neovascularization elsewhere; AREDS age related eye disease study; ARMD age related macular degeneration; POAG primary open angle glaucoma; EBMD epithelial/anterior basement membrane dystrophy; ACIOL anterior chamber intraocular lens; IOL intraocular lens; PCIOL posterior chamber intraocular lens; Phaco/IOL phacoemulsification with intraocular lens placement; [place] photorefractive keratectomy; LASIK laser assisted in situ keratomileusis; HTN hypertension; DM diabetes mellitus; COPD chronic obstructive pulmonary disease

## 2021-01-02 ENCOUNTER — Encounter (INDEPENDENT_AMBULATORY_CARE_PROVIDER_SITE_OTHER): Payer: Medicare HMO | Admitting: Ophthalmology

## 2021-01-09 ENCOUNTER — Ambulatory Visit (INDEPENDENT_AMBULATORY_CARE_PROVIDER_SITE_OTHER): Payer: Medicare HMO | Admitting: Pulmonary Disease

## 2021-01-09 ENCOUNTER — Ambulatory Visit (INDEPENDENT_AMBULATORY_CARE_PROVIDER_SITE_OTHER): Payer: Medicare HMO

## 2021-01-09 ENCOUNTER — Other Ambulatory Visit: Payer: Self-pay

## 2021-01-09 ENCOUNTER — Encounter: Payer: Self-pay | Admitting: Pulmonary Disease

## 2021-01-09 VITALS — BP 128/84 | HR 63 | Temp 97.3°F | Ht 64.0 in | Wt 89.0 lb

## 2021-01-09 DIAGNOSIS — Z8701 Personal history of pneumonia (recurrent): Secondary | ICD-10-CM | POA: Diagnosis not present

## 2021-01-09 DIAGNOSIS — J479 Bronchiectasis, uncomplicated: Secondary | ICD-10-CM | POA: Diagnosis not present

## 2021-01-09 DIAGNOSIS — J189 Pneumonia, unspecified organism: Secondary | ICD-10-CM | POA: Diagnosis not present

## 2021-01-09 NOTE — Patient Instructions (Signed)
Chest xray today  Follow up in 3 to 4 months

## 2021-01-09 NOTE — Progress Notes (Signed)
[name], Critical Care, and Sleep Medicine  Chief Complaint  Patient presents with  . Follow-up    CXR results. SOB worse recently. Productive cough with pale yellow mucus. Wheezing.     Constitutional:  BP 128/84 (BP Location: Right Arm, Cuff Size: Normal)   Pulse 63   Temp (!) 97.3 F (36.3 C)   Ht 5\' 4"  (1.626 m)   Wt 89 lb (40.4 kg)   SpO2 94%   BMI 15.28 kg/m   Past Medical History:  OA, Esophageal dysmotility, [place] esophagus, Gastroparesis, GERD, Iron deficiency anemia, Neuropathy, Raynaud's  Past Surgical History:  She  has a past surgical history that includes Cholecystectomy (2005); Breast lumpectomy; Total abdominal hysterectomy; Lung biopsy; Cataract extraction; Colonoscopy; Upper gastrointestinal endoscopy; Esophageal manometry (N/A, 10/06/2015); 24 hour ph study (N/A, 10/13/2015); Esophageal manometry (N/A, 03/10/2018); and Esophageal manometry (N/A, 10/15/2020).  Brief Summary:  [name] is a 80 y.o. female former smoker with bronchiectasis, recurrent aspiration pneumonia with history of reflux, emphysema and history of cryptogenic organizing       Subjective:   She was treated for pneumonia in January.  Slowly recovering.  Now having usual cough and chest congestion.  Feels that robitussin works better than mucinex.  Tessalon helps also.  Not having fever, hemoptysis.  Still sore in her Lt chest but improving.  Keeping up with exercise regimen, but gets tired more easily.  Opted against fundoplication.  CXR from January showed lingular infiltrate.  Physical Exam:   Appearance - thin  ENMT - no sinus tenderness, no oral exudate, no LAN, Mallampati 2 airway, no stridor  Respiratory - decreased breath sounds bilaterally, no wheezing or rales  CV - s1s2 regular rate and rhythm, no murmurs  Ext - no clubbing, no edema  Skin - no rashes  Psych - normal mood and affect   Collins testing:   PFT 12/31/09 >> FEV1 1.84(84%), FEV1% 73,  TLC 4.94(98%), DLCO 77%, +BD.  PFT 06/21/16 >> FEV1 1.22 (55%), FEV1% 76, TLC 5.29 (104%), DLCO 43%  PFT 12/21/16 >> FEV1 1.49 (69%), FEV1% 78, TLC 5.42 (107%), RV 158%, DLCO 51%  Chest Imaging:   CT chest 08/17/10 >> mild biapical scarring, basilar peribronchovascular nodularity and mild bronchiectasis  HRCT chest 11/28/17 >> mild centrilobular emphysema, peribronchovascular nodularity, mild BTX, LLL consolidation  CT chest 03/02/18 >> mild emphysema, areas of BTX, several nodular areas up to 1.4 cm  CT chest 08/25/18 >> centrilobular emphysema, BTX with wall thickening and tree in bud RML/lingula/bilateral lower lobes, unchanged opacity LLL, new patchy opacities LUL   HRCT chest 09/21/19 >> apical scarring, diffuse BTX with peribronchovascular nodularity, areas of mucoid impaction, coronary calcification  Cardiac Tests:   Echo11/23/20 >> EF 60 to 65%, mild LVH, grade 2 DD, mild MR, mild TR  Social History:  She  reports that she quit smoking about 52 years ago. Her smoking use included cigarettes. She has a 2.50 pack-year smoking history. She has never used smokeless tobacco. She reports that she does not drink alcohol and does not use drugs.  Family History:  Her family history includes Cancer in her maternal grandmother; Heart disease in her father and paternal grandfather; Leukemia in her paternal grandmother; Lung cancer in her paternal uncle; Lymphoma in her mother.    Discussion:  She has bronchiectasis with recurrent pneumonias in setting of gastroesophageal reflux.  She has recurrent episodes of respiratory infection with increased sputum production.  This contributes to intermittent episodes of shortness of breath and  fatigue.  Assessment/Plan:   Bronchiectasis, centrilobular emphysema. - she is unable to tolerate albuterol, hypertonic saline, or flutte valve - prn mucinex or robitussin - prn tessalon - she had pneumonia in January; clinically improved; will repeat chest  xray today and then determine if she needs repeat CT chest  Peripheral neuropathy. - followed by Dr. Delice Lesch with [facility]  Raynaud's phenomenon. - followed by Rheumatology at [facility]  GERD with dysphagia. - followed by Dr. Bryan Lemma with [facility]  Cachexia. - discussed options to keep up with caloric and protein intake  Time Spent Involved in Patient Care on Day of Examination:  23 minutes  Follow up:  Patient Instructions  Chest xray today  Follow up in 3 to 4 months   Medication List:   Allergies as of 01/09/2021      Reactions   Cortisone    unsure   Other Other (See Comments)   Prednisone    Sulfasalazine Other (See Comments)   Unsure of reaction   Sulfonamide Derivatives    Unsure of reaction      Medication List       Accurate as of January 09, 2021 12:37 PM. If you have any questions, ask your nurse or doctor.        benzonatate 200 MG capsule Commonly known as: TESSALON TAKE 1 CAPSULE AS NEEDED FOR COUGH.   calcium-vitamin D 500-200 MG-UNIT tablet Commonly known as: OSCAL WITH D Take 1 tablet by mouth.   dexlansoprazole 60 MG capsule Commonly known as: Dexilant Take 1 capsule (60 mg total) by mouth daily.   famotidine 20 MG tablet Commonly known as: Pepcid Take one tablet before dinner.   ferrous sulfate 325 (65 FE) MG EC tablet Take 325 mg by mouth 3 (three) times daily with meals.   fexofenadine 180 MG tablet Commonly known as: ALLEGRA Take 180 mg by mouth daily.   gabapentin 250 MG/5ML solution Commonly known as: NEURONTIN TAKE 2 ML AT BEDTIME. What changed: additional instructions   GNP Iron 200 (65 Fe) MG Tabs Generic drug: Ferrous Sulfate Dried Take 65 tablets by mouth daily.   guaiFENesin 600 MG 12 hr tablet Commonly known as: MUCINEX 1 tablet as needed   NONFORMULARY OR COMPOUNDED ITEM Peripheral Neuropathy Cream: Bupivacaine 1%, Doxepin 3%, Gabapentin 6%, Pentoxifylline 3%, Topiramate  1% Order faxed to [facility]   pseudoephedrine-guaifenesin 60-600 MG 12 hr tablet Commonly known as: MUCINEX D Take 1 tablet by mouth daily.   sucralfate 1 g tablet Commonly known as: CARAFATE TAKE 1 TABLET EVERY 6 HOURS AS NEEDED. MAY DISSOLVE 1 TABLET IN 1 TBSP OF DISTILLED WATER   TURMERIC PO Take 1 tablet by mouth as needed.   TYLENOL EXTRA STRENGTH PO Take 350 mg by mouth as needed.       Signature:  Chesley Mires, MD [place] Pager - [phone] 01/09/2021, 12:37 PM

## 2021-01-12 ENCOUNTER — Other Ambulatory Visit: Payer: Self-pay | Admitting: Neurology

## 2021-01-12 DIAGNOSIS — D3132 Benign neoplasm of left choroid: Secondary | ICD-10-CM | POA: Diagnosis not present

## 2021-01-12 DIAGNOSIS — Z961 Presence of intraocular lens: Secondary | ICD-10-CM | POA: Diagnosis not present

## 2021-01-12 DIAGNOSIS — H52203 Unspecified astigmatism, bilateral: Secondary | ICD-10-CM | POA: Diagnosis not present

## 2021-01-12 DIAGNOSIS — H35372 Puckering of macula, left eye: Secondary | ICD-10-CM | POA: Diagnosis not present

## 2021-01-12 MED ORDER — GABAPENTIN 250 MG/5ML PO SOLN: ORAL | 3 refills | Status: DC

## 2021-01-12 NOTE — Telephone Encounter (Signed)
Ok to give verbal to dispense today, thanks

## 2021-01-12 NOTE — Telephone Encounter (Signed)
Verbal order given to Abigail Butts at [facility].

## 2021-01-12 NOTE — Telephone Encounter (Signed)
Telephone call to [facility], Per Abigail Butts pt is too early to pick up Gabapentin,  and the pt advise her she wanted to increase. Abigail Butts advised pt to give the office a call.    Pt states she do not want to increase. Pt just want to pick up her medication now.   Please advise if it okay to call the pharmacy to okay pt to pick up now.

## 2021-01-12 NOTE — Telephone Encounter (Signed)
Patient needs a refill of her gabapentin. She states she tried to pick it up from the pharmacy but they told her that she couldn't pick any more up for a couple of weeks. She doesn't understand why when she takes it as needed and she doesn't take it every night. Please call.

## 2021-01-12 NOTE — Progress Notes (Signed)
Called and went over xray results per Dr Halford Chessman with patient. All questions answered and patient expressed full understanding of results. Nothing further needed at this time.

## 2021-01-13 ENCOUNTER — Telehealth: Payer: Self-pay | Admitting: Gastroenterology

## 2021-01-13 NOTE — Telephone Encounter (Signed)
[facility] called stating that PA for Dexilant expired. They already iniitiated PA in Cover My meds and is requesting that we complete it. Key # [id number].

## 2021-01-14 NOTE — Telephone Encounter (Signed)
Approval 01-14-21 through 01-14-2023

## 2021-01-14 NOTE — Telephone Encounter (Signed)
PA submitted via CoverMyMeds. PA APPROVED. PA Case ID: [id number]

## 2021-01-16 ENCOUNTER — Ambulatory Visit: Payer: Medicare HMO | Admitting: Podiatry

## 2021-01-21 ENCOUNTER — Other Ambulatory Visit: Payer: Self-pay

## 2021-01-21 MED ORDER — DEXLANSOPRAZOLE 60 MG PO CPDR: 1.0000 | DELAYED_RELEASE_CAPSULE | Freq: Every day | ORAL | 3 refills | Status: DC

## 2021-01-21 NOTE — Progress Notes (Signed)
rec'd fax request from [facility] - Patient requesting refills. Seen in the office 11-2020

## 2021-01-23 ENCOUNTER — Other Ambulatory Visit: Payer: Self-pay

## 2021-01-23 ENCOUNTER — Ambulatory Visit: Payer: Medicare HMO | Admitting: Podiatry

## 2021-01-23 DIAGNOSIS — M722 Plantar fascial fibromatosis: Secondary | ICD-10-CM | POA: Diagnosis not present

## 2021-01-23 DIAGNOSIS — M76822 Posterior tibial tendinitis, left leg: Secondary | ICD-10-CM

## 2021-01-27 ENCOUNTER — Encounter: Payer: Self-pay | Admitting: Podiatry

## 2021-01-27 NOTE — Progress Notes (Signed)
Subjective:  Patient ID: [name], female    DOB: 1941-10-25,  MRN: [id number]  Chief Complaint  Patient presents with  . Plantar Fasciitis    PT stated that she is doing better she has no concerns at this time    80 y.o. female presents with the above complaint.  Patient presents with a follow-up of plantar fasciitis and posterior tibial tendinitis.  Patient states she is doing a lot better.  She does not have any pain.  The injection bracing helped considerably.  She denies any other acute complaints.   Review of Systems: Negative except as noted in the HPI. Denies N/V/F/Ch.  Past Medical History:  Diagnosis Date  . Allergy   . Arthritis   . BOOP (bronchiolitis obliterans with organizing pneumonia) ([place])   . Bronchiectasis   . Cataract   . Esophageal dysmotility   . Gastroparesis   . GERD (gastroesophageal reflux disease)    severe  . Iron deficiency anemia   . Neuropathy   . Pneumonia   . Raynaud's disease   . Right lower lobe pneumonia December 2013    Current Outpatient Medications:  .  Acetaminophen (TYLENOL EXTRA STRENGTH PO), Take 350 mg by mouth as needed. , Disp: , Rfl:  .  benzonatate (TESSALON) 200 MG capsule, TAKE 1 CAPSULE AS NEEDED FOR COUGH., Disp: 90 capsule, Rfl: 3 .  calcium-vitamin D (OSCAL WITH D) 500-200 MG-UNIT tablet, Take 1 tablet by mouth., Disp: , Rfl:  .  dexlansoprazole (DEXILANT) 60 MG capsule, Take 1 capsule (60 mg total) by mouth daily., Disp: 90 capsule, Rfl: 3 .  famotidine (PEPCID) 20 MG tablet, Take one tablet before dinner., Disp: 90 tablet, Rfl: 5 .  ferrous sulfate 325 (65 FE) MG EC tablet, Take 325 mg by mouth 3 (three) times daily with meals., Disp: , Rfl:  .  Ferrous Sulfate Dried (GNP IRON) 200 (65 Fe) MG TABS, Take 65 tablets by mouth daily., Disp: , Rfl:  .  fexofenadine (ALLEGRA) 180 MG tablet, Take 180 mg by mouth daily., Disp: , Rfl:  .  gabapentin (NEURONTIN) 250 MG/5ML solution, TAKE 2 ML AT BEDTIME. Pt takes as needed,  Disp: 180 mL, Rfl: 3 .  guaiFENesin (MUCINEX) 600 MG 12 hr tablet, 1 tablet as needed, Disp: , Rfl:  .  NONFORMULARY OR COMPOUNDED ITEM, Peripheral Neuropathy Cream: Bupivacaine 1%, Doxepin 3%, Gabapentin 6%, Pentoxifylline 3%, Topiramate 1% Order faxed to [place]: 1 each, Rfl: 3 .  pseudoephedrine-guaifenesin (MUCINEX D) 60-600 MG 12 hr tablet, Take 1 tablet by mouth daily. , Disp: , Rfl:  .  sucralfate (CARAFATE) 1 g tablet, TAKE 1 TABLET EVERY 6 HOURS AS NEEDED. MAY DISSOLVE 1 TABLET IN 1 TBSP OF DISTILLED WATER, Disp: 60 tablet, Rfl: 2 .  TURMERIC PO, Take 1 tablet by mouth as needed., Disp: , Rfl:   Social History   Tobacco Use  Smoking Status Former Smoker  . Packs/day: 0.25  . Years: 10.00  . Pack years: 2.50  . Types: Cigarettes  . Quit date: 11/15/1968  . Years since quitting: 52.2  Smokeless Tobacco Never Used    Allergies  Allergen Reactions  . Cortisone     unsure  . Other Other (See Comments)  . Prednisone   . Sulfasalazine Other (See Comments)    Unsure of reaction  . Sulfonamide Derivatives     Unsure of reaction   Objective:  There were no vitals filed for this visit. There is no height or  weight on file to calculate BMI. Constitutional Well developed. Well nourished.  Vascular Dorsalis pedis pulses palpable bilaterally. Posterior tibial pulses palpable bilaterally. Capillary refill normal to all digits.  No cyanosis or clubbing noted. Pedal hair growth normal.  Neurologic Normal speech. Oriented to person, place, and time. Epicritic sensation to light touch grossly present bilaterally.  Dermatologic Nails well groomed and normal in appearance. No open wounds. No skin lesions.  Orthopedic: Normal joint ROM without pain or crepitus bilaterally. No visible deformities. No tender to palpation at the calcaneal tuber right. No pain with calcaneal squeeze right. Ankle ROM diminished range of motion right. Silfverskiold Test: positive  right.   Radiographs: Taken and reviewed. No acute fractures or dislocations. No evidence of stress fracture.  Plantar heel spur absent. Posterior heel spur absent.  Navicular exostosis noted to both lower extremity  Assessment:   1. Posterior tibial tendinitis, left   2. Plantar fasciitis, right    Plan:  Patient was evaluated and treated and all questions answered.  Left posterior tibial tendinitis with underlying bony exostosis of the navicular -Clinically healed with a steroid injection and immobilization.  Patient will continue utilizing the bracing to help with the future recurrence of pain if any.  Patient states understanding.   Plantar Fasciitis, right - Clinically healed with a steroid injection and bracing.  At this time I discussed with her the importance of prevention.  Patient states understanding and will make adjustments to the shoes to help with the prevention  No follow-ups on file.

## 2021-01-28 DIAGNOSIS — H52209 Unspecified astigmatism, unspecified eye: Secondary | ICD-10-CM | POA: Diagnosis not present

## 2021-01-28 DIAGNOSIS — H524 Presbyopia: Secondary | ICD-10-CM | POA: Diagnosis not present

## 2021-01-28 DIAGNOSIS — H5203 Hypermetropia, bilateral: Secondary | ICD-10-CM | POA: Diagnosis not present

## 2021-02-04 DIAGNOSIS — H04129 Dry eye syndrome of unspecified lacrimal gland: Secondary | ICD-10-CM | POA: Diagnosis not present

## 2021-02-04 DIAGNOSIS — J8410 Pulmonary fibrosis, unspecified: Secondary | ICD-10-CM | POA: Diagnosis not present

## 2021-02-04 DIAGNOSIS — R030 Elevated blood-pressure reading, without diagnosis of hypertension: Secondary | ICD-10-CM | POA: Diagnosis not present

## 2021-02-04 DIAGNOSIS — G629 Polyneuropathy, unspecified: Secondary | ICD-10-CM | POA: Diagnosis not present

## 2021-02-04 DIAGNOSIS — D649 Anemia, unspecified: Secondary | ICD-10-CM | POA: Diagnosis not present

## 2021-02-04 DIAGNOSIS — M1990 Unspecified osteoarthritis, unspecified site: Secondary | ICD-10-CM | POA: Diagnosis not present

## 2021-02-04 DIAGNOSIS — J439 Emphysema, unspecified: Secondary | ICD-10-CM | POA: Diagnosis not present

## 2021-02-04 DIAGNOSIS — D75839 Thrombocytosis, unspecified: Secondary | ICD-10-CM | POA: Diagnosis not present

## 2021-02-04 DIAGNOSIS — G8929 Other chronic pain: Secondary | ICD-10-CM | POA: Diagnosis not present

## 2021-02-04 DIAGNOSIS — N3941 Urge incontinence: Secondary | ICD-10-CM | POA: Diagnosis not present

## 2021-02-04 DIAGNOSIS — K219 Gastro-esophageal reflux disease without esophagitis: Secondary | ICD-10-CM | POA: Diagnosis not present

## 2021-02-13 ENCOUNTER — Other Ambulatory Visit: Payer: Self-pay | Admitting: Gastroenterology

## 2021-03-18 ENCOUNTER — Encounter: Payer: Self-pay | Admitting: Podiatry

## 2021-03-18 ENCOUNTER — Ambulatory Visit (INDEPENDENT_AMBULATORY_CARE_PROVIDER_SITE_OTHER): Payer: Medicare HMO | Admitting: Podiatry

## 2021-03-18 ENCOUNTER — Other Ambulatory Visit: Payer: Self-pay

## 2021-03-18 DIAGNOSIS — G629 Polyneuropathy, unspecified: Secondary | ICD-10-CM

## 2021-03-18 DIAGNOSIS — B351 Tinea unguium: Secondary | ICD-10-CM | POA: Diagnosis not present

## 2021-03-18 DIAGNOSIS — M79675 Pain in left toe(s): Secondary | ICD-10-CM | POA: Diagnosis not present

## 2021-03-18 DIAGNOSIS — L84 Corns and callosities: Secondary | ICD-10-CM | POA: Diagnosis not present

## 2021-03-18 DIAGNOSIS — H3530 Unspecified macular degeneration: Secondary | ICD-10-CM | POA: Insufficient documentation

## 2021-03-18 DIAGNOSIS — M79674 Pain in right toe(s): Secondary | ICD-10-CM

## 2021-03-18 NOTE — Patient Instructions (Signed)
[facility] for [facility] Therapy for Neuropathy of both lower extremities.  [phone]

## 2021-03-25 DIAGNOSIS — R531 Weakness: Secondary | ICD-10-CM | POA: Diagnosis not present

## 2021-03-25 DIAGNOSIS — R262 Difficulty in walking, not elsewhere classified: Secondary | ICD-10-CM | POA: Diagnosis not present

## 2021-03-25 DIAGNOSIS — M25675 Stiffness of left foot, not elsewhere classified: Secondary | ICD-10-CM | POA: Diagnosis not present

## 2021-03-25 DIAGNOSIS — M25674 Stiffness of right foot, not elsewhere classified: Secondary | ICD-10-CM | POA: Diagnosis not present

## 2021-03-25 DIAGNOSIS — M25571 Pain in right ankle and joints of right foot: Secondary | ICD-10-CM | POA: Diagnosis not present

## 2021-03-25 DIAGNOSIS — G609 Hereditary and idiopathic neuropathy, unspecified: Secondary | ICD-10-CM | POA: Diagnosis not present

## 2021-03-25 DIAGNOSIS — M25572 Pain in left ankle and joints of left foot: Secondary | ICD-10-CM | POA: Diagnosis not present

## 2021-03-27 NOTE — Progress Notes (Signed)
  Subjective:  Patient ID: [name], female    DOB: 12-02-1940,  MRN: [id number]  TABATHA RAZZANO presents to clinic today for at risk foot care with history of peripheral neuropathy and corn(s) b/l and painful thick toenails that are difficult to trim. Painful toenails interfere with ambulation. Aggravating factors include wearing enclosed shoe gear. Pain is relieved with periodic professional debridement. Painful corns are aggravated when weightbearing when wearing enclosed shoe gear. Pain is relieved with periodic professional debridement.   Patient continues to have numbness in feet with no relief.   Allergies  Allergen Reactions  . Cortisone     unsure  . Other Other (See Comments)  . Prednisone   . Sulfasalazine Other (See Comments)    Unsure of reaction  . Sulfonamide Derivatives     Unsure of reaction    Review of Systems: Negative except as noted in the HPI. Objective:   Constitutional AARIONA MOMON is a pleasant 80 y.o. Caucasian female, WD, WN in NAD. AAO x 3.   Vascular Capillary refill time to digits immediate b/l. Palpable pedal pulses b/l LE. Pedal hair sparse. Lower extremity skin temperature gradient within normal limits. No pain with calf compression b/l. No cyanosis or clubbing noted.  Neurologic Normal speech. Oriented to person, place, and time. Protective sensation decreased with 10 gram monofilament b/l.  Dermatologic Pedal skin with normal turgor, texture and tone bilaterally. No open wounds bilaterally. No interdigital macerations bilaterally. Toenails 1-5 b/l elongated, discolored, dystrophic, thickened, crumbly with subungual debris and tenderness to dorsal palpation. Hyperkeratotic lesion(s) L 3rd toe.  No erythema, no edema, no drainage, no fluctuance.  Orthopedic: Normal muscle strength 5/5 to all lower extremity muscle groups bilaterally. No pain crepitus or joint limitation noted with ROM b/l. Hammertoes noted to the 2-5 bilaterally.   Radiographs:  None Assessment:   1. Pain due to onychomycosis of toenails of both feet   2. Corns   3. Peripheral polyneuropathy    Plan:  Patient was evaluated and treated and all questions answered.  Onychomycosis with pain -Nails palliatively debridement as below -Educated on self-care  Procedure: Nail Debridement Rationale: Pain Type of Debridement: manual, sharp debridement. Instrumentation: Nail nipper, rotary burr. Number of Nails: 10 -Examined patient. -Continue diabetic shoes daily. -For her neuropathy symptoms, we discussed trying Neurogenx Therapy via Physical Therapy. She is interested in trying this modality. Referral sent to [facility] PT who will call her to arrange appoirntment. -Patient to continue soft, supportive shoe gear daily. -Toenails 1-5 b/l were debrided in length and girth with sterile nail nippers and dremel without iatrogenic bleeding.  -Corn(s) L 3rd toe pared utilizing sterile scalpel blade without complication or incident. Total number debrided=1. -Patient to report any pedal injuries to medical professional immediately. -Patient/POA to call should there be question/concern in the interim.  Return in about 3 months (around 06/18/2021).  Marzetta Board, DPM

## 2021-03-30 DIAGNOSIS — M25675 Stiffness of left foot, not elsewhere classified: Secondary | ICD-10-CM | POA: Diagnosis not present

## 2021-03-30 DIAGNOSIS — M25571 Pain in right ankle and joints of right foot: Secondary | ICD-10-CM | POA: Diagnosis not present

## 2021-03-30 DIAGNOSIS — R531 Weakness: Secondary | ICD-10-CM | POA: Diagnosis not present

## 2021-03-30 DIAGNOSIS — M25674 Stiffness of right foot, not elsewhere classified: Secondary | ICD-10-CM | POA: Diagnosis not present

## 2021-03-30 DIAGNOSIS — M25572 Pain in left ankle and joints of left foot: Secondary | ICD-10-CM | POA: Diagnosis not present

## 2021-03-30 DIAGNOSIS — R262 Difficulty in walking, not elsewhere classified: Secondary | ICD-10-CM | POA: Diagnosis not present

## 2021-03-30 DIAGNOSIS — G609 Hereditary and idiopathic neuropathy, unspecified: Secondary | ICD-10-CM | POA: Diagnosis not present

## 2021-04-01 ENCOUNTER — Ambulatory Visit: Payer: BC Managed Care – PPO | Admitting: Neurology

## 2021-04-01 ENCOUNTER — Other Ambulatory Visit: Payer: Self-pay

## 2021-04-01 ENCOUNTER — Encounter: Payer: Self-pay | Admitting: Neurology

## 2021-04-01 VITALS — BP 144/86 | HR 69 | Ht 64.0 in | Wt 89.4 lb

## 2021-04-01 DIAGNOSIS — M25572 Pain in left ankle and joints of left foot: Secondary | ICD-10-CM | POA: Diagnosis not present

## 2021-04-01 DIAGNOSIS — M25675 Stiffness of left foot, not elsewhere classified: Secondary | ICD-10-CM | POA: Diagnosis not present

## 2021-04-01 DIAGNOSIS — G629 Polyneuropathy, unspecified: Secondary | ICD-10-CM

## 2021-04-01 DIAGNOSIS — M25571 Pain in right ankle and joints of right foot: Secondary | ICD-10-CM | POA: Diagnosis not present

## 2021-04-01 DIAGNOSIS — R531 Weakness: Secondary | ICD-10-CM | POA: Diagnosis not present

## 2021-04-01 DIAGNOSIS — M25674 Stiffness of right foot, not elsewhere classified: Secondary | ICD-10-CM | POA: Diagnosis not present

## 2021-04-01 DIAGNOSIS — G609 Hereditary and idiopathic neuropathy, unspecified: Secondary | ICD-10-CM | POA: Diagnosis not present

## 2021-04-01 DIAGNOSIS — R262 Difficulty in walking, not elsewhere classified: Secondary | ICD-10-CM | POA: Diagnosis not present

## 2021-04-01 MED ORDER — GABAPENTIN 250 MG/5ML PO SOLN: ORAL | 3 refills | Status: DC

## 2021-04-01 NOTE — Progress Notes (Signed)
NEUROLOGY FOLLOW UP OFFICE NOTE  [name] [id number] Jul 31, 1941  HISTORY OF PRESENT ILLNESS: I had the pleasure of seeing [name] in follow-up in the neurology clinic on 04/01/2021.  The patient was last seen 8 months ago for neuropathy. She also has severe venous insufficiency and plantar fasciitis. Since her last visit, she reports that she still has stinging in both feet. She tried taking a little more of gabapentin but would feel the effects the next day. She usually takes a trace amount dipping her finger or tongue, then feels symptoms calm down in 2-3 hours. She also has venous stasis. [facility] and will be restarting physical therapy. She continues to drive and states she is very careful. She denies any headaches, dizziness, vision changes, no falls.    HPI: This is a pleasant 80 yo RH woman with a history of GERD, nutcracker esophagus, pulmonary fibrosis, in her usual state of health until early June 2017 while walking into a grocery she suddenly had left temporal pain, then it felt like she was hit on both side of the head with a hammer. She reports she was "hit like gangbusters," she was very dizzy with a spinning sensation, nauseated. She held on to the cart for balance and had difficulty concentrating. Her vision was blurred. She managed to finish her chore and walked to the car, then noticed that she was having a hard time talking. She could see the sentence broken up in her head and could not get the words out. She was able to drive home with no focal symptoms noted. The episode lasted 30 minutes or so, she felt weak and tired after. Since then she has had at least 2 or 3 more episodes of headaches with dizziness, which did not progress to speech difficulties. The symptoms would last around 5 minutes, no clear positional component to the dizziness. When she feels it coming on, she massages her head, which seems to help. She was unsure if Tylenol  helped. She denies any prior history of headaches. She denies any vision loss, photo/phonophobia, diplopia, dysarthria, jaw claudication, hearing loss. She has occasional pulsatile tinnitus more in the afternoons. She has GERD which makes swallowing difficult. She has a history of neuropathy affecting both feet, R>L, with burning and numbness/tingling for several years. She has been doing laser therapy for venous insufficiency/varicose veins. She denies any falls but trips more. Sleep varies. She is very active and rides her bike for a couple of hours daily. She works at a preschool. Her mother had a stroke at age 21.   Diagnostic Data:  Venous dopplers showed chronic venous insufficiency, duplex showed limited valvular insufficiency.    EMG/NCV of the right UE and LE confirmed moderate chronic sensorimotor axonal polyneuropathy. There was also note of moderate right ulnar neuropathy with slowing across the elbow and mild right median neuropathy consistent with carpal tunnel syndrome.   EMG at [facility] showing mild sensory neuropathy and negative punch biopsy for small fiber neuropathy, no evidence of vasculitis.  She has also been evaluated by Rhuematology for Reynauds disease and by Vascular at [facility] last June 2021. Records were reviewed, she has severe venous insufficiency, mostly in the deep veins. It was discussed how this contributes to majority of her symptoms, including neuropathy. There were severe skin changes compatible with stasis dermatitis and venous stasis. She was prescribed knee high compression stockings.   PAST MEDICAL HISTORY: Past Medical  History:  Diagnosis Date  . Allergy   . Arthritis   . BOOP (bronchiolitis obliterans with organizing pneumonia) ([place])   . Bronchiectasis   . Cataract   . Esophageal dysmotility   . Gastroparesis   . GERD (gastroesophageal reflux disease)    severe  . Iron deficiency anemia   . Neuropathy   . Pneumonia   . Raynaud's disease    . Right lower lobe pneumonia December 2013    MEDICATIONS: Current Outpatient Medications on File Prior to Visit  Medication Sig Dispense Refill  . Acetaminophen (TYLENOL EXTRA STRENGTH PO) Take 350 mg by mouth as needed.     . benzonatate (TESSALON) 200 MG capsule TAKE 1 CAPSULE AS NEEDED FOR COUGH. 90 capsule 3  . calcium-vitamin D (OSCAL WITH D) 500-200 MG-UNIT tablet Take 1 tablet by mouth.    . dexlansoprazole (DEXILANT) 60 MG capsule Take 1 capsule (60 mg total) by mouth daily. 90 capsule 3  . famotidine (PEPCID) 20 MG tablet Take one tablet before dinner. 90 tablet 5  . ferrous sulfate 325 (65 FE) MG EC tablet Take 325 mg by mouth 3 (three) times daily with meals.    . Ferrous Sulfate Dried (GNP IRON) 200 (65 Fe) MG TABS Take 65 tablets by mouth daily.    . fexofenadine (ALLEGRA) 180 MG tablet Take 180 mg by mouth daily.    [name] gabapentin (NEURONTIN) 250 MG/5ML solution TAKE 2 ML AT BEDTIME. Pt takes as needed 180 mL 3  . guaiFENesin (MUCINEX) 600 MG 12 hr tablet 1 tablet as needed    . NONFORMULARY OR COMPOUNDED ITEM Peripheral Neuropathy Cream: Bupivacaine 1%, Doxepin 3%, Gabapentin 6%, Pentoxifylline 3%, Topiramate 1% Order faxed to [place] 1 each 3  . pseudoephedrine-guaifenesin (MUCINEX D) 60-600 MG 12 hr tablet Take 1 tablet by mouth daily.     . sucralfate (CARAFATE) 1 g tablet TAKE 1 TABLET EVERY 6 HOURS AS NEEDED. MAY DISSOLVE 1 TABLET IN 1 TBSP OF DISTILLED WATER 60 tablet 2  . TURMERIC PO Take 1 tablet by mouth as needed.     No current facility-administered medications on file prior to visit.    ALLERGIES: Allergies  Allergen Reactions  . Cortisone     unsure  . Other Other (See Comments)  . Prednisone   . Sulfasalazine Other (See Comments)    Unsure of reaction  . Sulfonamide Derivatives     Unsure of reaction    FAMILY HISTORY: Family History  Problem Relation Age of Onset  . Lymphoma Mother   . Heart disease Father   . Lung cancer  Paternal Uncle   . Heart disease Paternal Grandfather   . Leukemia Paternal Grandmother   . Cancer Maternal Grandmother        unknown type  . Colon cancer Neg Hx   . Esophageal cancer Neg Hx   . Rectal cancer Neg Hx   . Stomach cancer Neg Hx     SOCIAL HISTORY: Social History   Socioeconomic History  . Marital status: Widowed    Spouse name: Not on file  . Number of children: 2  . Years of education: Not on file  . Highest education level: Not on file  Occupational History  . Occupation: retired Pharmacist, hospital    Comment: still teaching pre K   Tobacco Use  . Smoking status: Former Smoker    Packs/day: 0.25    Years: 10.00    Pack years: 2.50    Types: Cigarettes  Quit date: 11/15/1968    Years since quitting: 52.4  . Smokeless tobacco: Never Used  Vaping Use  . Vaping Use: Never used  Substance and Sexual Activity  . Alcohol use: No  . Drug use: No  . Sexual activity: Not Currently    Partners: Male  Other Topics Concern  . Not on file  Social History Narrative   Still working as a Print production planner at [employer]. - Retired 69yrs      Widowed      Lives alone   Lives one story townhouse      Right handed.   Social Determinants of Health   Financial Resource Strain: Not on file  Food Insecurity: Not on file  Transportation Needs: Not on file  Physical Activity: Not on file  Stress: Not on file  Social Connections: Not on file  Intimate Partner Violence: Not on file     PHYSICAL EXAM: Vitals:   04/01/21 1458  BP: (!) 144/86  Pulse: 69  SpO2: 98%   General: No acute distress Head:  Normocephalic/atraumatic Skin/Extremities: No rash, +bilateral venous stasis, red discoloration in both feet L>R Neurological Exam: alert and oriented to person, place, and time. No aphasia or dysarthria. Fund of knowledge is appropriate.  Recent and remote memory are intact.  Attention and concentration are normal.   Cranial nerves: Pupils equal, round. Extraocular  movements intact with no nystagmus. Visual fields full.  No facial asymmetry.  Motor: Bulk and tone normal, muscle strength 5/5 on both UE, 5/5 both proximal LE, 3+/5 bilateral foot dorsiflexion, 4/5 plantar flexion. Reflexes +1 throughout except for absent ankle jerks bilaterally. Sensation decreased in both LE. Gait slow and cautious.   IMPRESSION: This is a pleasant 80 yo RH woman with a history of GERD, nutcracker esophagus, pulmonary fibrosis, initially seen for transient episode of headache, vertigo, and expressive aphasia, MRI brain no acute changes. Her MRA head and neck was unremarkable except for moderate stenosis of the left P1 and diminished distal flow-related signal within the left PCA distribution. No further episodes since June 2017, continue daily aspirin and control of vascular risk factors. She follows in our clinic for neuropathy. She also has severe venous insufficiency, mostly in the deep veins. She has severe skin changes compatible with stasis dermatitis and venous stasis. She is on a very low dose of gabapentin but higher doses caused side effects. We discussed that there are other options we can try, but she would like to stay on current dose of gabapentin. We again discussed foot weakness and driving, she denies any issues, continue to monitor. Follow-up in 6 months, call for any changes.   Thank you for allowing me to participate in her care.  Please do not hesitate to call for any questions or concerns.   Ellouise Newer, M.D.   CC: Dr. Felipa Eth

## 2021-04-01 NOTE — Patient Instructions (Signed)
Always good to see you. Refills sent for gabapentin. Continue physical therapy. Continue to monitor driving. Follow-up in 6-8 months, call for any changes.

## 2021-04-06 DIAGNOSIS — G609 Hereditary and idiopathic neuropathy, unspecified: Secondary | ICD-10-CM | POA: Diagnosis not present

## 2021-04-06 DIAGNOSIS — R262 Difficulty in walking, not elsewhere classified: Secondary | ICD-10-CM | POA: Diagnosis not present

## 2021-04-06 DIAGNOSIS — M25674 Stiffness of right foot, not elsewhere classified: Secondary | ICD-10-CM | POA: Diagnosis not present

## 2021-04-06 DIAGNOSIS — R531 Weakness: Secondary | ICD-10-CM | POA: Diagnosis not present

## 2021-04-06 DIAGNOSIS — M25675 Stiffness of left foot, not elsewhere classified: Secondary | ICD-10-CM | POA: Diagnosis not present

## 2021-04-06 DIAGNOSIS — M25571 Pain in right ankle and joints of right foot: Secondary | ICD-10-CM | POA: Diagnosis not present

## 2021-04-06 DIAGNOSIS — M25572 Pain in left ankle and joints of left foot: Secondary | ICD-10-CM | POA: Diagnosis not present

## 2021-04-08 DIAGNOSIS — M25571 Pain in right ankle and joints of right foot: Secondary | ICD-10-CM | POA: Diagnosis not present

## 2021-04-08 DIAGNOSIS — M25675 Stiffness of left foot, not elsewhere classified: Secondary | ICD-10-CM | POA: Diagnosis not present

## 2021-04-08 DIAGNOSIS — G609 Hereditary and idiopathic neuropathy, unspecified: Secondary | ICD-10-CM | POA: Diagnosis not present

## 2021-04-08 DIAGNOSIS — M25572 Pain in left ankle and joints of left foot: Secondary | ICD-10-CM | POA: Diagnosis not present

## 2021-04-08 DIAGNOSIS — R262 Difficulty in walking, not elsewhere classified: Secondary | ICD-10-CM | POA: Diagnosis not present

## 2021-04-08 DIAGNOSIS — M25674 Stiffness of right foot, not elsewhere classified: Secondary | ICD-10-CM | POA: Diagnosis not present

## 2021-04-08 DIAGNOSIS — R531 Weakness: Secondary | ICD-10-CM | POA: Diagnosis not present

## 2021-04-09 DIAGNOSIS — G4459 Other complicated headache syndrome: Secondary | ICD-10-CM | POA: Diagnosis not present

## 2021-04-09 DIAGNOSIS — E611 Iron deficiency: Secondary | ICD-10-CM | POA: Diagnosis not present

## 2021-04-09 DIAGNOSIS — D692 Other nonthrombocytopenic purpura: Secondary | ICD-10-CM | POA: Diagnosis not present

## 2021-04-10 DIAGNOSIS — R531 Weakness: Secondary | ICD-10-CM | POA: Diagnosis not present

## 2021-04-10 DIAGNOSIS — M25571 Pain in right ankle and joints of right foot: Secondary | ICD-10-CM | POA: Diagnosis not present

## 2021-04-10 DIAGNOSIS — G609 Hereditary and idiopathic neuropathy, unspecified: Secondary | ICD-10-CM | POA: Diagnosis not present

## 2021-04-10 DIAGNOSIS — R262 Difficulty in walking, not elsewhere classified: Secondary | ICD-10-CM | POA: Diagnosis not present

## 2021-04-10 DIAGNOSIS — M25674 Stiffness of right foot, not elsewhere classified: Secondary | ICD-10-CM | POA: Diagnosis not present

## 2021-04-10 DIAGNOSIS — M25572 Pain in left ankle and joints of left foot: Secondary | ICD-10-CM | POA: Diagnosis not present

## 2021-04-10 DIAGNOSIS — M25675 Stiffness of left foot, not elsewhere classified: Secondary | ICD-10-CM | POA: Diagnosis not present

## 2021-04-12 ENCOUNTER — Other Ambulatory Visit: Payer: Self-pay | Admitting: Gastroenterology

## 2021-04-16 DIAGNOSIS — R531 Weakness: Secondary | ICD-10-CM | POA: Diagnosis not present

## 2021-04-16 DIAGNOSIS — M25675 Stiffness of left foot, not elsewhere classified: Secondary | ICD-10-CM | POA: Diagnosis not present

## 2021-04-16 DIAGNOSIS — R262 Difficulty in walking, not elsewhere classified: Secondary | ICD-10-CM | POA: Diagnosis not present

## 2021-04-16 DIAGNOSIS — M25572 Pain in left ankle and joints of left foot: Secondary | ICD-10-CM | POA: Diagnosis not present

## 2021-04-16 DIAGNOSIS — G609 Hereditary and idiopathic neuropathy, unspecified: Secondary | ICD-10-CM | POA: Diagnosis not present

## 2021-04-16 DIAGNOSIS — M25571 Pain in right ankle and joints of right foot: Secondary | ICD-10-CM | POA: Diagnosis not present

## 2021-04-16 DIAGNOSIS — M25674 Stiffness of right foot, not elsewhere classified: Secondary | ICD-10-CM | POA: Diagnosis not present

## 2021-04-17 DIAGNOSIS — R531 Weakness: Secondary | ICD-10-CM | POA: Diagnosis not present

## 2021-04-17 DIAGNOSIS — M25572 Pain in left ankle and joints of left foot: Secondary | ICD-10-CM | POA: Diagnosis not present

## 2021-04-17 DIAGNOSIS — M25571 Pain in right ankle and joints of right foot: Secondary | ICD-10-CM | POA: Diagnosis not present

## 2021-04-17 DIAGNOSIS — M25674 Stiffness of right foot, not elsewhere classified: Secondary | ICD-10-CM | POA: Diagnosis not present

## 2021-04-17 DIAGNOSIS — G609 Hereditary and idiopathic neuropathy, unspecified: Secondary | ICD-10-CM | POA: Diagnosis not present

## 2021-04-17 DIAGNOSIS — M25675 Stiffness of left foot, not elsewhere classified: Secondary | ICD-10-CM | POA: Diagnosis not present

## 2021-04-17 DIAGNOSIS — R262 Difficulty in walking, not elsewhere classified: Secondary | ICD-10-CM | POA: Diagnosis not present

## 2021-04-20 DIAGNOSIS — M25674 Stiffness of right foot, not elsewhere classified: Secondary | ICD-10-CM | POA: Diagnosis not present

## 2021-04-20 DIAGNOSIS — R531 Weakness: Secondary | ICD-10-CM | POA: Diagnosis not present

## 2021-04-20 DIAGNOSIS — M25675 Stiffness of left foot, not elsewhere classified: Secondary | ICD-10-CM | POA: Diagnosis not present

## 2021-04-20 DIAGNOSIS — M25571 Pain in right ankle and joints of right foot: Secondary | ICD-10-CM | POA: Diagnosis not present

## 2021-04-20 DIAGNOSIS — M25572 Pain in left ankle and joints of left foot: Secondary | ICD-10-CM | POA: Diagnosis not present

## 2021-04-20 DIAGNOSIS — R262 Difficulty in walking, not elsewhere classified: Secondary | ICD-10-CM | POA: Diagnosis not present

## 2021-04-20 DIAGNOSIS — G609 Hereditary and idiopathic neuropathy, unspecified: Secondary | ICD-10-CM | POA: Diagnosis not present

## 2021-04-22 DIAGNOSIS — R262 Difficulty in walking, not elsewhere classified: Secondary | ICD-10-CM | POA: Diagnosis not present

## 2021-04-22 DIAGNOSIS — M25571 Pain in right ankle and joints of right foot: Secondary | ICD-10-CM | POA: Diagnosis not present

## 2021-04-22 DIAGNOSIS — M25674 Stiffness of right foot, not elsewhere classified: Secondary | ICD-10-CM | POA: Diagnosis not present

## 2021-04-22 DIAGNOSIS — M25572 Pain in left ankle and joints of left foot: Secondary | ICD-10-CM | POA: Diagnosis not present

## 2021-04-22 DIAGNOSIS — M25675 Stiffness of left foot, not elsewhere classified: Secondary | ICD-10-CM | POA: Diagnosis not present

## 2021-04-22 DIAGNOSIS — R531 Weakness: Secondary | ICD-10-CM | POA: Diagnosis not present

## 2021-04-22 DIAGNOSIS — G609 Hereditary and idiopathic neuropathy, unspecified: Secondary | ICD-10-CM | POA: Diagnosis not present

## 2021-04-24 DIAGNOSIS — M25572 Pain in left ankle and joints of left foot: Secondary | ICD-10-CM | POA: Diagnosis not present

## 2021-04-24 DIAGNOSIS — M25675 Stiffness of left foot, not elsewhere classified: Secondary | ICD-10-CM | POA: Diagnosis not present

## 2021-04-24 DIAGNOSIS — G609 Hereditary and idiopathic neuropathy, unspecified: Secondary | ICD-10-CM | POA: Diagnosis not present

## 2021-04-24 DIAGNOSIS — M25674 Stiffness of right foot, not elsewhere classified: Secondary | ICD-10-CM | POA: Diagnosis not present

## 2021-04-24 DIAGNOSIS — M25571 Pain in right ankle and joints of right foot: Secondary | ICD-10-CM | POA: Diagnosis not present

## 2021-04-24 DIAGNOSIS — R262 Difficulty in walking, not elsewhere classified: Secondary | ICD-10-CM | POA: Diagnosis not present

## 2021-04-24 DIAGNOSIS — R531 Weakness: Secondary | ICD-10-CM | POA: Diagnosis not present

## 2021-04-27 DIAGNOSIS — G609 Hereditary and idiopathic neuropathy, unspecified: Secondary | ICD-10-CM | POA: Diagnosis not present

## 2021-04-27 DIAGNOSIS — M25674 Stiffness of right foot, not elsewhere classified: Secondary | ICD-10-CM | POA: Diagnosis not present

## 2021-04-27 DIAGNOSIS — Z79899 Other long term (current) drug therapy: Secondary | ICD-10-CM | POA: Diagnosis not present

## 2021-04-27 DIAGNOSIS — M25572 Pain in left ankle and joints of left foot: Secondary | ICD-10-CM | POA: Diagnosis not present

## 2021-04-27 DIAGNOSIS — M25571 Pain in right ankle and joints of right foot: Secondary | ICD-10-CM | POA: Diagnosis not present

## 2021-04-27 DIAGNOSIS — R262 Difficulty in walking, not elsewhere classified: Secondary | ICD-10-CM | POA: Diagnosis not present

## 2021-04-27 DIAGNOSIS — M25675 Stiffness of left foot, not elsewhere classified: Secondary | ICD-10-CM | POA: Diagnosis not present

## 2021-04-27 DIAGNOSIS — G629 Polyneuropathy, unspecified: Secondary | ICD-10-CM | POA: Diagnosis not present

## 2021-04-27 DIAGNOSIS — R519 Headache, unspecified: Secondary | ICD-10-CM | POA: Diagnosis not present

## 2021-04-27 DIAGNOSIS — Z049 Encounter for examination and observation for unspecified reason: Secondary | ICD-10-CM | POA: Diagnosis not present

## 2021-04-27 DIAGNOSIS — R531 Weakness: Secondary | ICD-10-CM | POA: Diagnosis not present

## 2021-04-30 ENCOUNTER — Other Ambulatory Visit: Payer: Self-pay | Admitting: Specialist

## 2021-04-30 DIAGNOSIS — R519 Headache, unspecified: Secondary | ICD-10-CM

## 2021-04-30 DIAGNOSIS — M25674 Stiffness of right foot, not elsewhere classified: Secondary | ICD-10-CM | POA: Diagnosis not present

## 2021-04-30 DIAGNOSIS — G609 Hereditary and idiopathic neuropathy, unspecified: Secondary | ICD-10-CM | POA: Diagnosis not present

## 2021-04-30 DIAGNOSIS — M25675 Stiffness of left foot, not elsewhere classified: Secondary | ICD-10-CM | POA: Diagnosis not present

## 2021-04-30 DIAGNOSIS — M25571 Pain in right ankle and joints of right foot: Secondary | ICD-10-CM | POA: Diagnosis not present

## 2021-04-30 DIAGNOSIS — R531 Weakness: Secondary | ICD-10-CM | POA: Diagnosis not present

## 2021-04-30 DIAGNOSIS — R262 Difficulty in walking, not elsewhere classified: Secondary | ICD-10-CM | POA: Diagnosis not present

## 2021-04-30 DIAGNOSIS — M25572 Pain in left ankle and joints of left foot: Secondary | ICD-10-CM | POA: Diagnosis not present

## 2021-05-01 DIAGNOSIS — G609 Hereditary and idiopathic neuropathy, unspecified: Secondary | ICD-10-CM | POA: Diagnosis not present

## 2021-05-01 DIAGNOSIS — M25675 Stiffness of left foot, not elsewhere classified: Secondary | ICD-10-CM | POA: Diagnosis not present

## 2021-05-01 DIAGNOSIS — M25572 Pain in left ankle and joints of left foot: Secondary | ICD-10-CM | POA: Diagnosis not present

## 2021-05-01 DIAGNOSIS — M25674 Stiffness of right foot, not elsewhere classified: Secondary | ICD-10-CM | POA: Diagnosis not present

## 2021-05-01 DIAGNOSIS — M25571 Pain in right ankle and joints of right foot: Secondary | ICD-10-CM | POA: Diagnosis not present

## 2021-05-01 DIAGNOSIS — R262 Difficulty in walking, not elsewhere classified: Secondary | ICD-10-CM | POA: Diagnosis not present

## 2021-05-01 DIAGNOSIS — R531 Weakness: Secondary | ICD-10-CM | POA: Diagnosis not present

## 2021-05-02 ENCOUNTER — Other Ambulatory Visit: Payer: Self-pay

## 2021-05-02 ENCOUNTER — Ambulatory Visit
Admission: RE | Admit: 2021-05-02 | Discharge: 2021-05-02 | Disposition: A | Discharge status: Home / Self Care | Payer: BC Managed Care – PPO | Source: Ambulatory Visit | Attending: Specialist | Admitting: Specialist

## 2021-05-02 DIAGNOSIS — R519 Headache, unspecified: Secondary | ICD-10-CM

## 2021-05-02 DIAGNOSIS — R410 Disorientation, unspecified: Secondary | ICD-10-CM | POA: Diagnosis not present

## 2021-05-02 MED ORDER — GADOBENATE DIMEGLUMINE 529 MG/ML IV SOLN
5.0000 mL | Freq: Once | INTRAVENOUS | Status: AC | PRN
  Administered 2021-05-02: 5 mL via INTRAVENOUS

## 2021-05-04 DIAGNOSIS — M25674 Stiffness of right foot, not elsewhere classified: Secondary | ICD-10-CM | POA: Diagnosis not present

## 2021-05-04 DIAGNOSIS — G609 Hereditary and idiopathic neuropathy, unspecified: Secondary | ICD-10-CM | POA: Diagnosis not present

## 2021-05-04 DIAGNOSIS — M25572 Pain in left ankle and joints of left foot: Secondary | ICD-10-CM | POA: Diagnosis not present

## 2021-05-04 DIAGNOSIS — R262 Difficulty in walking, not elsewhere classified: Secondary | ICD-10-CM | POA: Diagnosis not present

## 2021-05-04 DIAGNOSIS — M25675 Stiffness of left foot, not elsewhere classified: Secondary | ICD-10-CM | POA: Diagnosis not present

## 2021-05-04 DIAGNOSIS — M25571 Pain in right ankle and joints of right foot: Secondary | ICD-10-CM | POA: Diagnosis not present

## 2021-05-04 DIAGNOSIS — R531 Weakness: Secondary | ICD-10-CM | POA: Diagnosis not present

## 2021-05-07 DIAGNOSIS — M25675 Stiffness of left foot, not elsewhere classified: Secondary | ICD-10-CM | POA: Diagnosis not present

## 2021-05-07 DIAGNOSIS — R531 Weakness: Secondary | ICD-10-CM | POA: Diagnosis not present

## 2021-05-07 DIAGNOSIS — M25674 Stiffness of right foot, not elsewhere classified: Secondary | ICD-10-CM | POA: Diagnosis not present

## 2021-05-07 DIAGNOSIS — R262 Difficulty in walking, not elsewhere classified: Secondary | ICD-10-CM | POA: Diagnosis not present

## 2021-05-07 DIAGNOSIS — M25571 Pain in right ankle and joints of right foot: Secondary | ICD-10-CM | POA: Diagnosis not present

## 2021-05-07 DIAGNOSIS — G609 Hereditary and idiopathic neuropathy, unspecified: Secondary | ICD-10-CM | POA: Diagnosis not present

## 2021-05-07 DIAGNOSIS — M25572 Pain in left ankle and joints of left foot: Secondary | ICD-10-CM | POA: Diagnosis not present

## 2021-05-08 DIAGNOSIS — M25675 Stiffness of left foot, not elsewhere classified: Secondary | ICD-10-CM | POA: Diagnosis not present

## 2021-05-08 DIAGNOSIS — M25572 Pain in left ankle and joints of left foot: Secondary | ICD-10-CM | POA: Diagnosis not present

## 2021-05-08 DIAGNOSIS — M25674 Stiffness of right foot, not elsewhere classified: Secondary | ICD-10-CM | POA: Diagnosis not present

## 2021-05-08 DIAGNOSIS — M25571 Pain in right ankle and joints of right foot: Secondary | ICD-10-CM | POA: Diagnosis not present

## 2021-05-08 DIAGNOSIS — G609 Hereditary and idiopathic neuropathy, unspecified: Secondary | ICD-10-CM | POA: Diagnosis not present

## 2021-05-08 DIAGNOSIS — R531 Weakness: Secondary | ICD-10-CM | POA: Diagnosis not present

## 2021-05-08 DIAGNOSIS — R262 Difficulty in walking, not elsewhere classified: Secondary | ICD-10-CM | POA: Diagnosis not present

## 2021-05-11 DIAGNOSIS — M25572 Pain in left ankle and joints of left foot: Secondary | ICD-10-CM | POA: Diagnosis not present

## 2021-05-11 DIAGNOSIS — M25675 Stiffness of left foot, not elsewhere classified: Secondary | ICD-10-CM | POA: Diagnosis not present

## 2021-05-11 DIAGNOSIS — R262 Difficulty in walking, not elsewhere classified: Secondary | ICD-10-CM | POA: Diagnosis not present

## 2021-05-11 DIAGNOSIS — R531 Weakness: Secondary | ICD-10-CM | POA: Diagnosis not present

## 2021-05-11 DIAGNOSIS — G609 Hereditary and idiopathic neuropathy, unspecified: Secondary | ICD-10-CM | POA: Diagnosis not present

## 2021-05-11 DIAGNOSIS — M25674 Stiffness of right foot, not elsewhere classified: Secondary | ICD-10-CM | POA: Diagnosis not present

## 2021-05-11 DIAGNOSIS — M25571 Pain in right ankle and joints of right foot: Secondary | ICD-10-CM | POA: Diagnosis not present

## 2021-05-13 DIAGNOSIS — M25674 Stiffness of right foot, not elsewhere classified: Secondary | ICD-10-CM | POA: Diagnosis not present

## 2021-05-13 DIAGNOSIS — M25571 Pain in right ankle and joints of right foot: Secondary | ICD-10-CM | POA: Diagnosis not present

## 2021-05-13 DIAGNOSIS — M25572 Pain in left ankle and joints of left foot: Secondary | ICD-10-CM | POA: Diagnosis not present

## 2021-05-13 DIAGNOSIS — G609 Hereditary and idiopathic neuropathy, unspecified: Secondary | ICD-10-CM | POA: Diagnosis not present

## 2021-05-13 DIAGNOSIS — M25675 Stiffness of left foot, not elsewhere classified: Secondary | ICD-10-CM | POA: Diagnosis not present

## 2021-05-13 DIAGNOSIS — R262 Difficulty in walking, not elsewhere classified: Secondary | ICD-10-CM | POA: Diagnosis not present

## 2021-05-13 DIAGNOSIS — R531 Weakness: Secondary | ICD-10-CM | POA: Diagnosis not present

## 2021-05-15 DIAGNOSIS — R531 Weakness: Secondary | ICD-10-CM | POA: Diagnosis not present

## 2021-05-15 DIAGNOSIS — R262 Difficulty in walking, not elsewhere classified: Secondary | ICD-10-CM | POA: Diagnosis not present

## 2021-05-15 DIAGNOSIS — M25674 Stiffness of right foot, not elsewhere classified: Secondary | ICD-10-CM | POA: Diagnosis not present

## 2021-05-15 DIAGNOSIS — M25572 Pain in left ankle and joints of left foot: Secondary | ICD-10-CM | POA: Diagnosis not present

## 2021-05-15 DIAGNOSIS — M25675 Stiffness of left foot, not elsewhere classified: Secondary | ICD-10-CM | POA: Diagnosis not present

## 2021-05-15 DIAGNOSIS — M25571 Pain in right ankle and joints of right foot: Secondary | ICD-10-CM | POA: Diagnosis not present

## 2021-05-15 DIAGNOSIS — G609 Hereditary and idiopathic neuropathy, unspecified: Secondary | ICD-10-CM | POA: Diagnosis not present

## 2021-05-22 DIAGNOSIS — M25674 Stiffness of right foot, not elsewhere classified: Secondary | ICD-10-CM | POA: Diagnosis not present

## 2021-05-22 DIAGNOSIS — M25571 Pain in right ankle and joints of right foot: Secondary | ICD-10-CM | POA: Diagnosis not present

## 2021-05-22 DIAGNOSIS — M25675 Stiffness of left foot, not elsewhere classified: Secondary | ICD-10-CM | POA: Diagnosis not present

## 2021-05-22 DIAGNOSIS — R262 Difficulty in walking, not elsewhere classified: Secondary | ICD-10-CM | POA: Diagnosis not present

## 2021-05-22 DIAGNOSIS — G609 Hereditary and idiopathic neuropathy, unspecified: Secondary | ICD-10-CM | POA: Diagnosis not present

## 2021-05-22 DIAGNOSIS — R531 Weakness: Secondary | ICD-10-CM | POA: Diagnosis not present

## 2021-05-22 DIAGNOSIS — M25572 Pain in left ankle and joints of left foot: Secondary | ICD-10-CM | POA: Diagnosis not present

## 2021-05-25 ENCOUNTER — Telehealth: Payer: Self-pay | Admitting: *Deleted

## 2021-05-25 NOTE — Telephone Encounter (Signed)
Good! Physical therapy would have to fax me the request for me to sign for additional sessions.

## 2021-05-25 NOTE — Telephone Encounter (Signed)
Returned call to patient, gave message per Dr Elisha Ponder and she will have PT fax over the request.

## 2021-05-25 NOTE — Telephone Encounter (Signed)
Patient is calling because.she is slowly seeing results ,is requesting additional Physical therapy. Please advise.

## 2021-05-26 DIAGNOSIS — R519 Headache, unspecified: Secondary | ICD-10-CM | POA: Diagnosis not present

## 2021-05-26 DIAGNOSIS — G629 Polyneuropathy, unspecified: Secondary | ICD-10-CM | POA: Diagnosis not present

## 2021-05-29 DIAGNOSIS — M25674 Stiffness of right foot, not elsewhere classified: Secondary | ICD-10-CM | POA: Diagnosis not present

## 2021-05-29 DIAGNOSIS — R531 Weakness: Secondary | ICD-10-CM | POA: Diagnosis not present

## 2021-05-29 DIAGNOSIS — M25571 Pain in right ankle and joints of right foot: Secondary | ICD-10-CM | POA: Diagnosis not present

## 2021-05-29 DIAGNOSIS — G609 Hereditary and idiopathic neuropathy, unspecified: Secondary | ICD-10-CM | POA: Diagnosis not present

## 2021-05-29 DIAGNOSIS — R262 Difficulty in walking, not elsewhere classified: Secondary | ICD-10-CM | POA: Diagnosis not present

## 2021-05-29 DIAGNOSIS — J471 Bronchiectasis with (acute) exacerbation: Secondary | ICD-10-CM | POA: Diagnosis not present

## 2021-05-29 DIAGNOSIS — M25572 Pain in left ankle and joints of left foot: Secondary | ICD-10-CM | POA: Diagnosis not present

## 2021-05-29 DIAGNOSIS — M25675 Stiffness of left foot, not elsewhere classified: Secondary | ICD-10-CM | POA: Diagnosis not present

## 2021-06-08 ENCOUNTER — Other Ambulatory Visit: Payer: Self-pay | Admitting: Gastroenterology

## 2021-06-19 ENCOUNTER — Ambulatory Visit (INDEPENDENT_AMBULATORY_CARE_PROVIDER_SITE_OTHER): Payer: Medicare HMO | Admitting: Podiatry

## 2021-06-19 ENCOUNTER — Other Ambulatory Visit: Payer: Self-pay

## 2021-06-19 ENCOUNTER — Encounter: Payer: Self-pay | Admitting: Podiatry

## 2021-06-19 DIAGNOSIS — M79674 Pain in right toe(s): Secondary | ICD-10-CM

## 2021-06-19 DIAGNOSIS — M79675 Pain in left toe(s): Secondary | ICD-10-CM | POA: Diagnosis not present

## 2021-06-19 DIAGNOSIS — B351 Tinea unguium: Secondary | ICD-10-CM | POA: Diagnosis not present

## 2021-06-19 DIAGNOSIS — L84 Corns and callosities: Secondary | ICD-10-CM

## 2021-06-19 DIAGNOSIS — G629 Polyneuropathy, unspecified: Secondary | ICD-10-CM

## 2021-06-21 NOTE — Progress Notes (Signed)
  Subjective:  Patient ID: [name], female    DOB: 04-01-1941,  MRN: [id number]  [name] presents to clinic today for at risk foot care with history of peripheral neuropathy and corn(s) left 3rd digit and painful thick toenails that are difficult to trim. Painful toenails interfere with ambulation. Aggravating factors include wearing enclosed shoe gear. Pain is relieved with periodic professional debridement. Painful corns are aggravated when weightbearing when wearing enclosed shoe gear. Pain is relieved with periodic professional debridement.  She states she still goes to [facility]. She is now in the maintenance phase of PT for her neuropathy and she feels this helps her better than the marble exercises.  PCP is [name], MD , and last visit was 03/23/022.  Allergies  Allergen Reactions   Cortisone     unsure   Other Other (See Comments)   Prednisone    Sulfasalazine Other (See Comments)    Unsure of reaction   Sulfonamide Derivatives     Unsure of reaction    Review of Systems: Negative except as noted in the HPI. Objective:   Constitutional [name] is a pleasant 80 y.o. Caucasian female, WD, WN in NAD. AAO x 3.   Vascular Capillary refill time to digits immediate b/l. Palpable DP pulse(s) b/l lower extremities Palpable PT pulse(s) b/l lower extremities Pedal hair sparse. Lower extremity skin temperature gradient within normal limits. No pain with calf compression b/l. No cyanosis or clubbing noted.  Neurologic Normal speech. Oriented to person, place, and time. Protective sensation decreased with 10 gram monofilament b/l.  Dermatologic Pedal skin with normal turgor, texture and tone b/l lower extremities. No open wounds b/l lower extremities. No interdigital macerations b/l lower extremities. Toenails 1-5 b/l elongated, discolored, dystrophic, thickened, crumbly with subungual debris and tenderness to dorsal palpation. Hyperkeratotic lesion(s) L 3rd toe.  No  erythema, no edema, no drainage, no fluctuance.  Orthopedic: Normal muscle strength 5/5 to all lower extremity muscle groups bilaterally. Hammertoe(s) noted to the 2-5 bilaterally.   Radiographs: None Assessment:   1. Pain due to onychomycosis of toenails of both feet   2. Corns   3. Peripheral polyneuropathy    Plan:  -Examined patient. -Patient to continue soft, supportive shoe gear daily. -Toenails 1-5 b/l were debrided in length and girth with sterile nail nippers and dremel without iatrogenic bleeding.  -Corn(s) L 3rd toe pared utilizing sterile scalpel blade without complication or incident. Total number debrided=1. -Patient to report any pedal injuries to medical professional immediately. -Patient/POA to call should there be question/concern in the interim.  Return in about 3 months (around 09/19/2021).  Marzetta Board, DPM

## 2021-06-22 ENCOUNTER — Encounter: Payer: Self-pay | Admitting: Pulmonary Disease

## 2021-06-22 ENCOUNTER — Other Ambulatory Visit: Payer: Self-pay

## 2021-06-22 ENCOUNTER — Ambulatory Visit (INDEPENDENT_AMBULATORY_CARE_PROVIDER_SITE_OTHER): Payer: Medicare HMO | Admitting: Pulmonary Disease

## 2021-06-22 ENCOUNTER — Ambulatory Visit (INDEPENDENT_AMBULATORY_CARE_PROVIDER_SITE_OTHER): Payer: Medicare HMO

## 2021-06-22 VITALS — BP 110/74 | HR 60 | Temp 97.6°F | Ht 64.0 in | Wt 87.0 lb

## 2021-06-22 DIAGNOSIS — J471 Bronchiectasis with (acute) exacerbation: Secondary | ICD-10-CM | POA: Diagnosis not present

## 2021-06-22 DIAGNOSIS — J439 Emphysema, unspecified: Secondary | ICD-10-CM | POA: Diagnosis not present

## 2021-06-22 DIAGNOSIS — J479 Bronchiectasis, uncomplicated: Secondary | ICD-10-CM | POA: Diagnosis not present

## 2021-06-22 NOTE — Patient Instructions (Signed)
Chest xray today  Will arrange for sputum culture testing  Follow up in 4 months

## 2021-06-22 NOTE — Progress Notes (Signed)
[place] [facility]  Chief Complaint  Patient presents with   Follow-up    Labored breathing at times. Pain on inhalation and exhalation at times    Constitutional:  BP 110/74 (BP Location: Left Arm, Patient Position: Sitting, Cuff Size: Normal)   Pulse 60   Temp 97.6 F (36.4 C) (Oral)   Ht '5\' 4"'$  (1.626 m)   Wt 87 lb (39.5 kg)   SpO2 100%   BMI 14.93 kg/m   Past Medical History:  OA, Esophageal dysmotility, Nutcracker esophagus, Gastroparesis, GERD, Iron deficiency anemia, Neuropathy, Raynaud's  Past Surgical History:  She  has a past surgical history that includes Cholecystectomy (2005); Breast lumpectomy; Total abdominal hysterectomy; Lung biopsy; Cataract extraction; Colonoscopy; Upper gastrointestinal endoscopy; Esophageal manometry (N/A, 10/06/2015); 24 hour ph study (N/A, 10/13/2015); Esophageal manometry (N/A, 03/10/2018); and Esophageal manometry (N/A, 10/15/2020).  Brief Summary:  [name] is a 80 y.o. female former smoker with bronchiectasis, recurrent aspiration pneumonia with history of reflux, emphysema and history of cryptogenic organizing       Subjective:   She has persistent chest discomfort since she had pneumonia in January 2022.  She has cough with dark yellow to brown sputum.  Sometimes has some blood streaking.  Gets worse with exercise.  Has appointment with ENT to assess sinus congestion.  No fever, but gets sweats.  Seems to be having more bad days than good.  Physical Exam:   Appearance - well kempt, thin  ENMT - no sinus tenderness, no oral exudate, no LAN, Mallampati 2 airway, no stridor  Respiratory - decreased breath sounds, no wheeze/rales  CV - s1s2 regular rate and rhythm, no murmurs  Ext - no clubbing, no edema  Skin - no rashes  Psych - normal mood and affect    Pulmonary testing:  PFT 12/31/09 >> FEV1 1.84(84%), FEV1% 73, TLC 4.94(98%), DLCO 77%, +BD. PFT 06/21/16 >> FEV1 1.22 (55%),  FEV1% 76, TLC 5.29 (104%), DLCO 43% PFT 12/21/16 >> FEV1 1.49 (69%), FEV1% 78, TLC 5.42 (107%), RV 158%, DLCO 51%  Chest Imaging:  CT chest 08/17/10 >> mild biapical scarring, basilar peribronchovascular nodularity and mild bronchiectasis HRCT chest 11/28/17 >> mild centrilobular emphysema, peribronchovascular nodularity, mild BTX, LLL consolidation CT chest 03/02/18 >> mild emphysema, areas of BTX, several nodular areas up to 1.4 cm CT chest 08/25/18 >> centrilobular emphysema, BTX with wall thickening and tree in bud RML/lingula/bilateral lower lobes, unchanged opacity LLL, new patchy opacities LUL  HRCT chest 09/21/19 >> apical scarring, diffuse BTX with peribronchovascular nodularity, areas of mucoid impaction, coronary calcification  Cardiac Tests:  Echo 10/08/19 >> EF 60 to 65%, mild LVH, grade 2 DD, mild MR, mild TR  Social History:  She  reports that she quit smoking about 52 years ago. Her smoking use included cigarettes. She has a 2.50 pack-year smoking history. She has never used smokeless tobacco. She reports that she does not drink alcohol and does not use drugs.  Family History:  Her family history includes Cancer in her maternal grandmother; Heart disease in her father and paternal grandfather; Leukemia in her paternal grandmother; Lung cancer in her paternal uncle; Lymphoma in her mother.    Discussion:  She has bronchiectasis with recurrent pneumonias in setting of gastroesophageal reflux.  She has recurrent episodes of respiratory infection with increased sputum production.  This contributes to intermittent episodes of shortness of breath and fatigue.  Her symptoms have progressed since she had pneumonia in January 2022.  Assessment/Plan:  Bronchiectasis, centrilobular emphysema. - she is unable to tolerate albuterol, hypertonic saline, or flutte valve - change mucinex to bid - prn tessalon - repeat chest xray and arrange for sputum culture testing; based on results will  determine if she needs repeat round of antibiotics  Sinus pressure. - has has appointment with ENT later this month   Peripheral neuropathy. - followed by Dr. Delice Lesch with [facility]   Raynaud's phenomenon. - followed by Rheumatology at [facility]   GERD with dysphagia. - followed by [facility]   Cachexia. - discussed options to keep up with caloric and protein intake  Time Spent Involved in Patient Care on Day of Examination:  32 minutes  Follow up:   Patient Instructions  Chest xray today  Will arrange for sputum culture testing  Follow up in 4 months  Medication List:   Allergies as of 06/22/2021       Reactions   Cortisone    unsure   Other Other (See Comments)   Prednisone    Sulfasalazine Other (See Comments)   Unsure of reaction   Sulfonamide Derivatives    Unsure of reaction        Medication List        Accurate as of June 22, 2021 10:19 AM. If you have any questions, ask your nurse or doctor.          benzonatate 200 MG capsule Commonly known as: TESSALON TAKE 1 CAPSULE AS NEEDED FOR COUGH.   calcium-vitamin D 500-200 MG-UNIT tablet Commonly known as: OSCAL WITH D Take 1 tablet by mouth.   dexlansoprazole 60 MG capsule Commonly known as: Dexilant Take 1 capsule (60 mg total) by mouth daily.   ferrous sulfate 325 (65 FE) MG EC tablet Take 325 mg by mouth 3 (three) times daily with meals.   gabapentin 250 MG/5ML solution Commonly known as: NEURONTIN TAKE 2 ML AT BEDTIME. Pt takes as needed   imipramine 10 MG tablet Commonly known as: TOFRANIL Take 30 mg by mouth daily.   pseudoephedrine-guaifenesin 60-600 MG 12 hr tablet Commonly known as: MUCINEX D Take 1 tablet by mouth daily.   sucralfate 1 g tablet Commonly known as: CARAFATE TAKE 1 TABLET EVERY 6 HOURS AS NEEDED. MAY DISSOLVE 1 TABLET IN 1 TBSP OF DISTILLED WATER   TYLENOL EXTRA STRENGTH PO Take 350 mg by mouth as needed.   Vitamin D (Ergocalciferol)  50000 units Caps Take by mouth.        Signature:  Chesley Mires, MD [place] Pager - [phone] 06/22/2021, 10:19 AM

## 2021-06-24 ENCOUNTER — Telehealth: Payer: Self-pay | Admitting: Pulmonary Disease

## 2021-06-24 NOTE — Telephone Encounter (Signed)
DG Chest 2 View  Result Date: 06/22/2021 CLINICAL DATA:  History of 80 year old female with bronchiectasis. EXAM: CHEST - 2 VIEW COMPARISON:  Chest x-ray 01/09/2021. FINDINGS: Lungs are hyperexpanded with emphysematous changes. Diffuse peribronchial cuffing. No consolidative airspace disease. No pleural effusions. No pneumothorax. No pulmonary nodule or mass noted. Pulmonary vasculature and the cardiomediastinal silhouette are within normal limits. Atherosclerosis in the thoracic aorta. IMPRESSION: 1. Diffuse peribronchial cuffing, concerning for an acute bronchitis. 2. Emphysema. 3. Aortic atherosclerosis. Electronically Signed   By: Vinnie Langton M.D.   On: 06/22/2021 15:55      Please let her know her chest xray shows changes from acute bronchitis.  She will need another course of antibiotic.  Can send script for augmentin 875 one pill bid for 7 days.

## 2021-06-24 NOTE — Telephone Encounter (Signed)
ATC patient, LMTCB 

## 2021-06-25 DIAGNOSIS — R519 Headache, unspecified: Secondary | ICD-10-CM | POA: Diagnosis not present

## 2021-06-25 DIAGNOSIS — G6289 Other specified polyneuropathies: Secondary | ICD-10-CM | POA: Diagnosis not present

## 2021-06-25 NOTE — Telephone Encounter (Signed)
ATC patient x2 LMTCB, No DPR on file

## 2021-06-26 NOTE — Telephone Encounter (Signed)
Tried calling the pt back and had to [place] in triage since VS wants her to take course of ABX and will mark urgent

## 2021-06-29 MED ORDER — AMOXICILLIN-POT CLAVULANATE 875-125 MG PO TABS: 1.0000 | ORAL_TABLET | Freq: Two times a day (BID) | ORAL | 0 refills | Status: DC

## 2021-06-29 NOTE — Telephone Encounter (Signed)
Called and spoke with patient. She verbalized understanding of results. She wishes to have this abx sent to [facility].   Nothing further needed at time of call.

## 2021-07-29 DIAGNOSIS — H01004 Unspecified blepharitis left upper eyelid: Secondary | ICD-10-CM | POA: Diagnosis not present

## 2021-07-29 DIAGNOSIS — H01001 Unspecified blepharitis right upper eyelid: Secondary | ICD-10-CM | POA: Diagnosis not present

## 2021-08-03 ENCOUNTER — Ambulatory Visit
Admission: RE | Admit: 2021-08-03 | Discharge: 2021-08-03 | Disposition: A | Discharge status: Home / Self Care | Payer: Medicare HMO | Source: Ambulatory Visit | Attending: Geriatric Medicine | Admitting: Geriatric Medicine

## 2021-08-03 ENCOUNTER — Other Ambulatory Visit: Payer: Self-pay | Admitting: Geriatric Medicine

## 2021-08-03 DIAGNOSIS — E441 Mild protein-calorie malnutrition: Secondary | ICD-10-CM | POA: Diagnosis not present

## 2021-08-03 DIAGNOSIS — R197 Diarrhea, unspecified: Secondary | ICD-10-CM

## 2021-08-03 DIAGNOSIS — Z79899 Other long term (current) drug therapy: Secondary | ICD-10-CM | POA: Diagnosis not present

## 2021-08-03 DIAGNOSIS — E871 Hypo-osmolality and hyponatremia: Secondary | ICD-10-CM | POA: Diagnosis not present

## 2021-08-03 DIAGNOSIS — D508 Other iron deficiency anemias: Secondary | ICD-10-CM | POA: Diagnosis not present

## 2021-08-03 DIAGNOSIS — K219 Gastro-esophageal reflux disease without esophagitis: Secondary | ICD-10-CM | POA: Diagnosis not present

## 2021-08-03 DIAGNOSIS — R109 Unspecified abdominal pain: Secondary | ICD-10-CM | POA: Diagnosis not present

## 2021-08-03 DIAGNOSIS — K623 Rectal prolapse: Secondary | ICD-10-CM | POA: Diagnosis not present

## 2021-08-03 DIAGNOSIS — D692 Other nonthrombocytopenic purpura: Secondary | ICD-10-CM | POA: Diagnosis not present

## 2021-08-04 ENCOUNTER — Other Ambulatory Visit: Payer: Self-pay | Admitting: Gastroenterology

## 2021-08-11 ENCOUNTER — Other Ambulatory Visit: Payer: Self-pay

## 2021-08-11 ENCOUNTER — Encounter: Payer: Self-pay | Admitting: Pulmonary Disease

## 2021-08-11 ENCOUNTER — Ambulatory Visit (INDEPENDENT_AMBULATORY_CARE_PROVIDER_SITE_OTHER): Payer: Medicare HMO | Admitting: Pulmonary Disease

## 2021-08-11 VITALS — BP 120/60 | HR 69 | Temp 97.7°F | Ht 64.0 in | Wt 88.0 lb

## 2021-08-11 DIAGNOSIS — J432 Centrilobular emphysema: Secondary | ICD-10-CM | POA: Diagnosis not present

## 2021-08-11 DIAGNOSIS — J479 Bronchiectasis, uncomplicated: Secondary | ICD-10-CM

## 2021-08-11 DIAGNOSIS — E43 Unspecified severe protein-calorie malnutrition: Secondary | ICD-10-CM

## 2021-08-11 NOTE — Patient Instructions (Signed)
Follow up in 4 months 

## 2021-08-11 NOTE — Progress Notes (Signed)
[facility]  Chief Complaint  Patient presents with   Follow-up    Productive cough, light yellow phlegm. Chest tightness when moving.    Constitutional:  BP 120/60 (BP Location: Left Arm, Patient Position: Sitting)   Pulse 69   Temp 97.7 F (36.5 C) (Oral)   Ht 5\' 4"  (1.626 m)   Wt 88 lb (39.9 kg)   SpO2 99% Comment: on RA  BMI 15.11 kg/m   Past Medical History:  OA, Esophageal dysmotility, Nutcracker esophagus, Gastroparesis, GERD, Iron deficiency anemia, Neuropathy, Raynaud's  Past Surgical History:  She  has a past surgical history that includes Cholecystectomy (2005); Breast lumpectomy; Total abdominal hysterectomy; Lung biopsy; Cataract extraction; Colonoscopy; Upper gastrointestinal endoscopy; Esophageal manometry (N/A, 10/06/2015); 24 hour ph study (N/A, 10/13/2015); Esophageal manometry (N/A, 03/10/2018); and Esophageal manometry (N/A, 10/15/2020).  Brief Summary:  [name] is a 80 y.o. female former smoker with bronchiectasis, recurrent aspiration pneumonia with history of reflux, emphysema and history of cryptogenic organizing       Subjective:   She was seen by ENT and told her sinuses are okay.  She was then seen by ophthalmology and found to have build up under her eyelid.  This was causing her head pain, and is improved some with therapy.  She has an opportunity to travel to [place], but is concerned about what this type of travel could do to her health.  She is more worried about her GI issues than her breathing.  She has soreness in her chest when she coughs or takes a deep breath.  Brings up phlegm sometimes.  No fever or hemoptysis.  Physical Exam:   Appearance - well kempt, thin  ENMT - no sinus tenderness, no oral exudate, no LAN, Mallampati 2 airway, no stridor  Respiratory - equal breath sounds bilaterally, no wheezing or rales  CV - s1s2 regular rate and rhythm, no murmurs  Ext - no clubbing, no  edema  Skin - no rashes  Psych - normal mood and affect     Pulmonary testing:  PFT 12/31/09 >> FEV1 1.84(84%), FEV1% 73, TLC 4.94(98%), DLCO 77%, +BD. PFT 06/21/16 >> FEV1 1.22 (55%), FEV1% 76, TLC 5.29 (104%), DLCO 43% PFT 12/21/16 >> FEV1 1.49 (69%), FEV1% 78, TLC 5.42 (107%), RV 158%, DLCO 51%  Chest Imaging:  CT chest 08/17/10 >> mild biapical scarring, basilar peribronchovascular nodularity and mild bronchiectasis HRCT chest 11/28/17 >> mild centrilobular emphysema, peribronchovascular nodularity, mild BTX, LLL consolidation CT chest 03/02/18 >> mild emphysema, areas of BTX, several nodular areas up to 1.4 cm CT chest 08/25/18 >> centrilobular emphysema, BTX with wall thickening and tree in bud RML/lingula/bilateral lower lobes, unchanged opacity LLL, new patchy opacities LUL  HRCT chest 09/21/19 >> apical scarring, diffuse BTX with peribronchovascular nodularity, areas of mucoid impaction, coronary calcification  Cardiac Tests:  Echo 10/08/19 >> EF 60 to 65%, mild LVH, grade 2 DD, mild MR, mild TR  Social History:  She  reports that she quit smoking about 52 years ago. Her smoking use included cigarettes. She has a 2.50 pack-year smoking history. She has never used smokeless tobacco. She reports that she does not drink alcohol and does not use drugs.  Family History:  Her family history includes Cancer in her maternal grandmother; Heart disease in her father and paternal grandfather; Leukemia in her paternal grandmother; Lung cancer in her paternal uncle; Lymphoma in her mother.    Discussion:  She has bronchiectasis with recurrent pneumonias  in setting of gastroesophageal reflux.  She has recurrent episodes of respiratory infection with increased sputum production.  This contributes to intermittent episodes of shortness of breath and fatigue.    Assessment/Plan:   Bronchiectasis, centrilobular emphysema. - she is unable to tolerate albuterol, hypertonic saline, or flutte  valve - mucinex, tessalon prn - repeat chest xray and arrange for sputum culture testing; based on results will determine if she needs repeat round of antibiotics - discussed the possible risks involved with international travel given her respiratory status; will reassess again in January in case she decides to proceed with travel plan - she plans to wait until later in the year to get her flu shot   Peripheral neuropathy. - followed by Dr. Delice Lesch with [facility]   Raynaud's phenomenon. - followed by [facility]ology at [facility]   GERD with dysphagia. - she will schedule follow up with Dr. Havery Moros with [facility]   Cachexia. - discussed options to keep up with caloric and protein intake  Time Spent Involved in Patient Care on Day of Examination:  34 minutes  Follow up:   Patient Instructions  Follow up in 4 months  Medication List:   Allergies as of 08/11/2021       Reactions   Cortisone    unsure   Other Other (See Comments)   Prednisone    Sulfasalazine Other (See Comments)   Unsure of reaction   Sulfonamide Derivatives    Unsure of reaction        Medication List        Accurate as of August 11, 2021  1:22 PM. If you have any questions, ask your nurse or doctor.          STOP taking these medications    amoxicillin-clavulanate 875-125 MG tablet Commonly known as: AUGMENTIN Stopped by: Chesley Mires, MD   ferrous sulfate 325 (65 FE) MG EC tablet Stopped by: Chesley Mires, MD   imipramine 10 MG tablet Commonly known as: TOFRANIL Stopped by: Chesley Mires, MD       TAKE these medications    benzonatate 200 MG capsule Commonly known as: TESSALON TAKE 1 CAPSULE AS NEEDED FOR COUGH.   calcium-vitamin D 500-200 MG-UNIT tablet Commonly known as: OSCAL WITH D Take 1 tablet by mouth.   dexlansoprazole 60 MG capsule Commonly known as: Dexilant Take 1 capsule (60 mg total) by mouth daily.   gabapentin 250 MG/5ML solution Commonly  known as: NEURONTIN TAKE 2 ML AT BEDTIME. Pt takes as needed   pseudoephedrine-guaifenesin 60-600 MG 12 hr tablet Commonly known as: MUCINEX D Take 1 tablet by mouth daily.   sucralfate 1 g tablet Commonly known as: CARAFATE TAKE 1 TABLET EVERY 6 HOURS AS NEEDED. MAY DISSOLVE 1 TABLET IN 1 TBSP OF DISTILLED WATER   TYLENOL EXTRA STRENGTH PO Take 350 mg by mouth as needed.   Vitamin D (Ergocalciferol) 50000 units Caps Take by mouth.        Signature:  Chesley Mires, MD [facility] Pager - [phone] 08/11/2021, 1:22 PM

## 2021-08-14 DIAGNOSIS — H0100B Unspecified blepharitis left eye, upper and lower eyelids: Secondary | ICD-10-CM | POA: Diagnosis not present

## 2021-08-14 DIAGNOSIS — H0100A Unspecified blepharitis right eye, upper and lower eyelids: Secondary | ICD-10-CM | POA: Diagnosis not present

## 2021-09-07 DIAGNOSIS — M8589 Other specified disorders of bone density and structure, multiple sites: Secondary | ICD-10-CM | POA: Diagnosis not present

## 2021-09-07 DIAGNOSIS — Z1231 Encounter for screening mammogram for malignant neoplasm of breast: Secondary | ICD-10-CM | POA: Diagnosis not present

## 2021-09-16 DIAGNOSIS — M85851 Other specified disorders of bone density and structure, right thigh: Secondary | ICD-10-CM | POA: Diagnosis not present

## 2021-09-18 ENCOUNTER — Ambulatory Visit (INDEPENDENT_AMBULATORY_CARE_PROVIDER_SITE_OTHER): Payer: Medicare HMO | Admitting: Gastroenterology

## 2021-09-18 ENCOUNTER — Encounter: Payer: Self-pay | Admitting: Gastroenterology

## 2021-09-18 VITALS — BP 100/70 | HR 96 | Ht 64.0 in | Wt 86.2 lb

## 2021-09-18 DIAGNOSIS — K219 Gastro-esophageal reflux disease without esophagitis: Secondary | ICD-10-CM | POA: Diagnosis not present

## 2021-09-18 DIAGNOSIS — R1310 Dysphagia, unspecified: Secondary | ICD-10-CM | POA: Diagnosis not present

## 2021-09-18 DIAGNOSIS — R194 Change in bowel habit: Secondary | ICD-10-CM | POA: Diagnosis not present

## 2021-09-18 DIAGNOSIS — M8580 Other specified disorders of bone density and structure, unspecified site: Secondary | ICD-10-CM

## 2021-09-18 DIAGNOSIS — Z79899 Other long term (current) drug therapy: Secondary | ICD-10-CM | POA: Diagnosis not present

## 2021-09-18 MED ORDER — FAMOTIDINE 20 MG PO TABS: 20.0000 mg | ORAL_TABLET | Freq: Two times a day (BID) | ORAL | 3 refills | Status: DC

## 2021-09-18 NOTE — Progress Notes (Signed)
HPI :  80 year old female here for follow-up visit for reflux, history of chronic lung disease with bronchiectasis, osteopenia.  See prior notes for details of her case.  She has had refractory GERD. Prior work-up has shown a DeMeester score of greater than 150 during pH study.  She has had an extensive evaluation for this in recent years.  She has regurgitation, history of aspiration, we think it is related to her bronchiectasis and chronic lung disease.  She has failed maximal medical therapy.  In the past year I referred her to Dr. Bryan Lemma for another opinion for possible TIF.  She underwent a repeat EGD last year which showed a Hill grade 4 view of the cardia, she also had esophageal manometry done showing mild EG J outflow obstruction.  Given these findings she was deemed to not be a candidate for TIF and was more appropriate for surgical repair of the hernia and fundoplication.  Given her lung disease, frailty, age, she has wanted to avoid surgical treatment for this.  She is currently taking Dexilant 60 mg a day as well as Carafate as needed.  She states the Carafate helps her quite a bit, especially at night.  She still struggles however with ongoing pyrosis, regurgitation, sore throat raspy voice.  She also has endorsed some worsening dysphagia since have seen her.  She states solids occasionally get stuck when she swallows.  This is not all the time but happens occasionally.  She tolerates liquids just fine.  She had no obvious stenosis or stricture on her last EGD.  She has been diagnosed with osteopenia since of last seen her.  She has not had recent falls but has had them in the past.  We discussed her increased risk for fracture given chronic PPI use.  There was a few months ago she states she had a few days worth of severe diarrhea, and had a few episodes of a few days each.  This caused her to be quite fatigued.  She is currently back to her normal state and feeling okay regards to  her bowel function.  She did not try Imodium during these episodes.  She states she tried calling her office but could not get through to me.  I do not see any recorded phone calls in epic.  Prior work-up:  Colonoscopy 11/20/18 -  The perianal and digital rectal examinations were normal. - The terminal ileum appeared normal. - A 3 mm polyp was found in the ileocecal valve. The polyp was sessile. The polyp was removed with a cold snare. Resection and retrieval were complete. - A 3 mm polyp was found in the rectum. The polyp was sessile. The polyp was removed with a cold snare. Resection and retrieval were complete. - The exam was otherwise without abnormality other than some tortousity. - Biopsies for histology were taken with a cold forceps from the right colon and left colon for evaluation of microscopic colitis given the patient's incidental complaints of persistent loose Stools.    -EGD 08/2015 - 1cm HH, gastritis without ulceration, HP negative -pH study done in 2016 showing Demeester score of 152.7 but only symptom index of 23% for reflux. Most of reflux was weakly acidic. -Esophageal manometry showed what was thought to be nutcracker esophagus at the time; No swallows with DCI > 8000 - GES normal 2016 -Esophogram 10/10/2017  - small sliding type hiatal hernia, no distal stricture / mass, normal motility -Repeat manometry 03/10/2018 - hypercontractile esophagus, no other significant pathology;  single swallow with DCI >8000 -EGD 11/20/2018- no hiatal hernia, Hill grade 2 valve, slightly irregular Z line but no BP, 3 to 4 mm duodenal adenoma removed by cold snare with otherwise normal-appearing stomach and duodenum. -EGD 07/2020-superficial ulcers in the mid esophagus (path benign), Hill grade 4, 1 cm axial by 4 cm transverse width hiatal hernia, mild gastritis -Esophageal Manometry 10/2020-normal peristalsis, normal resting pressure at EGJ with incomplete relaxation (IRP 17.6, normal <15) c/w  some degree of EGJ outflow obstruction, but not achalasia     Past Medical History:  Diagnosis Date   Allergy    Arthritis    Atherosclerosis of aorta ([place]) 2019   ct   BOOP (bronchiolitis obliterans with organizing pneumonia) ([place])    Bronchiectasis    Cataract    Esophageal dysmotility    Gastroparesis    GERD (gastroesophageal reflux disease)    severe   Iron deficiency anemia    Macular degeneration (senile) of retina    Neuropathy    Nutcracker esophagus    Osteopenia    Pneumonia    Pulmonary fibrosis ([facility])    Raynaud's disease    Right lower lobe pneumonia 10/2012   Thrombocytosis      Past Surgical History:  Procedure Laterality Date   67 HOUR [place] STUDY N/A 10/13/2015   Procedure: 24 HOUR [place] STUDY;  Surgeon: Gatha Mayer, MD;  Location: [facility];  Service: Endoscopy;  Laterality: N/A;   BREAST LUMPECTOMY     left, x 2   CATARACT EXTRACTION     both eyes   CHOLECYSTECTOMY  2005   COLONOSCOPY     ESOPHAGEAL MANOMETRY N/A 10/06/2015   Procedure: ESOPHAGEAL MANOMETRY (EM);  Surgeon: Manus Gunning, MD;  Location: [facility];  Service: Gastroenterology;  Laterality: N/A;   ESOPHAGEAL MANOMETRY N/A 03/10/2018   Procedure: ESOPHAGEAL MANOMETRY (EM);  Surgeon: Mauri Pole, MD;  Location: [facility];  Service: Endoscopy;  Laterality: N/A;   ESOPHAGEAL MANOMETRY N/A 10/15/2020   Procedure: ESOPHAGEAL MANOMETRY (EM);  Surgeon: Lavena Bullion, DO;  Location: [facility];  Service: Gastroenterology;  Laterality: N/A;   LUNG BIOPSY     vats right 2003   TOTAL ABDOMINAL HYSTERECTOMY     UPPER GASTROINTESTINAL ENDOSCOPY     Family History  Problem Relation Age of Onset   Lymphoma Mother    Heart disease Father    Lung cancer Paternal Uncle    Heart disease Paternal Grandfather    Leukemia Paternal Grandmother    Cancer Maternal Grandmother        unknown type   Colon cancer Neg Hx    Esophageal cancer Neg Hx    Rectal cancer Neg Hx     Stomach cancer Neg Hx    Social History   Tobacco Use   Smoking status: Former    Packs/day: 0.25    Years: 10.00    Pack years: 2.50    Types: Cigarettes    Quit date: 11/15/1968    Years since quitting: 52.8   Smokeless tobacco: Never  Vaping Use   Vaping Use: Never used  Substance Use Topics   Alcohol use: No   Drug use: No   Current Outpatient Medications  Medication Sig Dispense Refill   Acetaminophen (TYLENOL EXTRA STRENGTH PO) Take 350 mg by mouth as needed.      benzonatate (TESSALON) 200 MG capsule TAKE 1 CAPSULE AS NEEDED FOR COUGH. 90 capsule 3   calcium-vitamin D (OSCAL WITH D) 500-200 MG-UNIT  tablet Take 1 tablet by mouth.     dexlansoprazole (DEXILANT) 60 MG capsule Take 1 capsule (60 mg total) by mouth daily. 90 capsule 3   gabapentin (NEURONTIN) 250 MG/5ML solution TAKE 2 ML AT BEDTIME. Pt takes as needed 180 mL 3   pseudoephedrine-guaifenesin (MUCINEX D) 60-600 MG 12 hr tablet Take 1 tablet by mouth daily.      sucralfate (CARAFATE) 1 g tablet TAKE 1 TABLET EVERY 6 HOURS AS NEEDED. MAY DISSOLVE 1 TABLET IN 1 TBSP OF DISTILLED WATER 60 tablet 2   Vitamin D, Ergocalciferol, 50000 units CAPS Take by mouth.     No current facility-administered medications for this visit.   Allergies  Allergen Reactions   Cortisone     unsure   Other Other (See Comments)   Prednisone    Sulfasalazine Other (See Comments)    Unsure of reaction   Sulfonamide Derivatives     Unsure of reaction     Review of Systems: All systems reviewed and negative except where noted in HPI.   Lab Results  Component Value Date   WBC 9.8 05/18/2020   HGB 11.2 (L) 05/18/2020   HCT 36.1 05/18/2020   MCV 87.8 05/18/2020   PLT 409 (H) 05/18/2020    Lab Results  Component Value Date   CREATININE 0.35 (L) 05/18/2020   BUN 6 (L) 05/18/2020   NA 137 05/18/2020   K 3.5 05/18/2020   CL 98 05/18/2020   CO2 30 05/18/2020     Physical Exam: BP 100/70   Pulse 96   Ht 5\' 4"  (1.626 m)    Wt 86 lb 4 oz (39.1 kg)   SpO2 98%   BMI 14.80 kg/m  Constitutional: Pleasant,well-developed, female in no acute distress. Neurological: Alert and oriented to person place and time. Psychiatric: Normal mood and affect. Behavior is normal.   ASSESSMENT AND PLAN: 80 year old female here for reassessment of following:  GERD Dysphagia Long-term PPI use Osteopenia Altered bowel habits  The patient has severe refractory GERD with hiatal hernia and laxity at the GE J (Hill grade 4), with evidence of mild EGJ outflow obstruction on most recent manometry.  Unfortunately based on this information she is not a candidate for TIF with Dr. Bryan Lemma, her best option for treatment of this would be surgical repair of hernia with Nissen.  However, given her age, comorbidities, she is quite anxious about surgery and really wants to avoid this.  I am concerned that she will continue to have severe symptoms without surgery fortunately.  She is already on Dexilant and Carafate, the latter which helps quite a bit.  She can add Pepcid 20 mg once to twice daily as needed, and Gaviscon as well, although not confident this will make a big difference in her symptoms.  Unfortunately she does require long term PPI, she has developed osteopenia and at higher risk for fracture in this light.  Her primary care is deciding amongst therapies for treatment for this.  I would avoid Fosamax given her esophageal issues and I think IV therapies would be more appropriate in her case.  I offered her referral to see the surgeons again to discuss this, she really does not want to have a consultation at this time.  Hopefully her lung disease is stable and does not intervally worsen due to this.  Regarding her dysphagia, suspect due to EG J outflow obstruction or motility, less she has developed a peptic stricture since her last exam a year ago.  Recommend a barium swallow tablet to see if we can help localize dysphagia and get another  assessment of motility.  She is agreeable to this.  Otherwise she should contact me if her bowel function changes again acutely.  We will work-up for infectious etiology should that happen again.  She states she had a very hard time getting in touch with me when she tried calling few months ago.  I recommend she sign up for MyChart account and send me a message directly if she is having hard time getting through the phone tree.  Plan: - continue Dexilant - start pepcid 20mg  BID - continue carafate - it helps more than anything - Gaviscon PRN OTC - Barium swallow with tablet - discussed osteopenia - reclast may be better option, avoid fosamax - sign up for mychart account - contact me if she has bowel problems recur  Jolly Mango, MD [facility]

## 2021-09-18 NOTE — Patient Instructions (Addendum)
If you are age 78 or older, your body mass index should be between 23-30. Your Body mass index is 14.8 kg/m. If this is out of the aforementioned range listed, please consider follow up with your Primary Care Provider.  If you are age 39 or younger, your body mass index should be between 19-25. Your Body mass index is 14.8 kg/m. If this is out of the aformentioned range listed, please consider follow up with your Primary Care Provider.   ________________________________________________________  The [facility] providers would like to encourage you to use [facility] to communicate with providers for non-urgent requests or questions.  Due to long hold times on the telephone, sending your provider a message by [facility] may be a faster and more efficient way to get a response.  Please allow 48 business hours for a response.  Please remember that this is for non-urgent requests.  _______________________________________________________   Continue Dexilant  Continue Carafate  We have sent the following medications to your pharmacy for you to pick up at your convenience: Pepcid 20 mg: Take twice daily  Please purchase the following medications over the counter and take as directed: Gaviscon: Take as directed as needed  You will be contacted by [facility] in the next 2 days to arrange a Barium Swallow with Tablet.  The number on your caller ID will be [phone], please answer when they call.  If you have not heard from them in 2 days please call [phone] to schedule:    You have been scheduled for a Barium Esophogram at [facility] (1st floor of the hospital) on _______________ at _____________. Please arrive 15 minutes prior to your appointment for registration. Make certain not to have anything to eat or drink 3 hours prior to your test. If you need to reschedule for any reason, please contact radiology at [phone] to do  so. _____________________________________________________________ A barium swallow is an examination that concentrates on views of the esophagus. This tends to be a double contrast exam (barium and two liquids which, when combined, create a gas to distend the wall of the oesophagus) or single contrast (non-ionic iodine based). The study is usually tailored to your symptoms so a good history is essential. Attention is paid during the study to the form, structure and configuration of the esophagus, looking for functional disorders (such as aspiration, dysphagia, achalasia, motility and reflux) EXAMINATION You may be asked to change into a gown, depending on the type of swallow being performed. A radiologist and radiographer will perform the procedure. The radiologist will advise you of the type of contrast selected for your procedure and direct you during the exam. You will be asked to stand, sit or lie in several different positions and to hold a small amount of fluid in your mouth before being asked to swallow while the imaging is performed .In some instances you may be asked to swallow barium coated marshmallows to assess the motility of a solid food bolus. The exam can be recorded as a digital or video fluoroscopy procedure. POST PROCEDURE It will take 1-2 days for the barium to pass through your system. To facilitate this, it is important, unless otherwise directed, to increase your fluids for the next 24-48hrs and to resume your normal diet.  This test typically takes about 30 minutes to perform. ____________________________________________________________  Please contact us if your bowel symptoms reoccur.  Thank you for entrusting me with your care and for choosing [facility], Dr. [place] Cellar

## 2021-09-19 ENCOUNTER — Other Ambulatory Visit: Payer: Self-pay | Admitting: Gastroenterology

## 2021-09-21 ENCOUNTER — Other Ambulatory Visit: Payer: Self-pay

## 2021-09-21 ENCOUNTER — Ambulatory Visit (INDEPENDENT_AMBULATORY_CARE_PROVIDER_SITE_OTHER): Payer: Medicare HMO | Admitting: Podiatry

## 2021-09-21 DIAGNOSIS — M79674 Pain in right toe(s): Secondary | ICD-10-CM | POA: Diagnosis not present

## 2021-09-21 DIAGNOSIS — M79675 Pain in left toe(s): Secondary | ICD-10-CM

## 2021-09-21 DIAGNOSIS — G629 Polyneuropathy, unspecified: Secondary | ICD-10-CM | POA: Diagnosis not present

## 2021-09-21 DIAGNOSIS — L84 Corns and callosities: Secondary | ICD-10-CM | POA: Diagnosis not present

## 2021-09-21 DIAGNOSIS — B351 Tinea unguium: Secondary | ICD-10-CM

## 2021-09-25 ENCOUNTER — Encounter: Payer: Self-pay | Admitting: Podiatry

## 2021-09-25 DIAGNOSIS — E222 Syndrome of inappropriate secretion of antidiuretic hormone: Secondary | ICD-10-CM | POA: Insufficient documentation

## 2021-09-25 DIAGNOSIS — D692 Other nonthrombocytopenic purpura: Secondary | ICD-10-CM | POA: Insufficient documentation

## 2021-09-25 DIAGNOSIS — D509 Iron deficiency anemia, unspecified: Secondary | ICD-10-CM | POA: Insufficient documentation

## 2021-09-25 NOTE — Progress Notes (Signed)
  Subjective:  Patient ID: [name], female    DOB: May 28, 1941,  MRN: [id number]  [name] presents to clinic today for at risk foot care with history of peripheral neuropathy and corn(s) left 3rd toe and painful thick toenails that are difficult to trim. Painful toenails interfere with ambulation. Aggravating factors include wearing enclosed shoe gear. Pain is relieved with periodic professional debridement. Painful corns are aggravated when weightbearing when wearing enclosed shoe gear. Pain is relieved with periodic professional debridement.  Patient notes no new pedal problems on today's visit.  PCP is [name], MD , and last visit was 09/16/2021.  Allergies  Allergen Reactions   Cortisone     unsure   Other Other (See Comments)   Prednisone    Sulfasalazine Other (See Comments)    Unsure of reaction   Sulfonamide Derivatives     Unsure of reaction    Review of Systems: Negative except as noted in the HPI. Objective:   Constitutional CLORENE NERIO is a pleasant 80 y.o. Caucasian female, thin build in NAD. AAO x 3.   Vascular CFT immediate b/l LE. Palpable DP/PT pulses b/l LE. Digital hair sparse b/l. Skin temperature gradient WNL b/l. No pain with calf compression b/l. No edema noted b/l. Lower extremity skin temperature gradient within normal limits. No cyanosis or clubbing noted.  Neurologic Normal speech. Oriented to person, place, and time. Protective sensation decreased with 10 gram monofilament b/l.  Dermatologic Pedal skin with normal turgor, texture and tone b/l lower extremities. No open wounds b/l LE. No interdigital macerations noted b/l LE. Toenails 1-5 b/l elongated, discolored, dystrophic, thickened, crumbly with subungual debris and tenderness to dorsal palpation. Hyperkeratotic lesion(s) L 3rd toe.  No erythema, no edema, no drainage, no fluctuance.  Orthopedic: Normal muscle strength 5/5 to all lower extremity muscle groups bilaterally. Hammertoe  deformity noted 2-5 b/l.   Radiographs: None Assessment:   1. Pain due to onychomycosis of toenails of both feet   2. Corns   3. Peripheral polyneuropathy    Plan:  Patient was evaluated and treated and all questions answered. Consent given for treatment as described below: -No new findings. No new orders. -Toenails 1-5 left and 1-5 right debrided in length and girth without iatrogenic bleeding with sterile nail nipper and dremel.  -Corn(s) L 3rd toe pared utilizing sterile scalpel blade without complication or incident. Total number debrided=1. -Patient/POA to call should there be question/concern in the interim.  Return in about 3 months (around 12/22/2021).  Marzetta Board, DPM

## 2021-10-06 ENCOUNTER — Other Ambulatory Visit: Payer: Self-pay

## 2021-10-06 ENCOUNTER — Ambulatory Visit (HOSPITAL_COMMUNITY)
Admission: RE | Admit: 2021-10-06 | Discharge: 2021-10-06 | Disposition: A | Discharge status: Home / Self Care | Payer: Medicare HMO | Source: Ambulatory Visit | Attending: Gastroenterology | Admitting: Gastroenterology

## 2021-10-06 DIAGNOSIS — R1310 Dysphagia, unspecified: Secondary | ICD-10-CM

## 2021-10-06 DIAGNOSIS — Z79899 Other long term (current) drug therapy: Secondary | ICD-10-CM

## 2021-10-06 DIAGNOSIS — M8580 Other specified disorders of bone density and structure, unspecified site: Secondary | ICD-10-CM | POA: Insufficient documentation

## 2021-10-06 DIAGNOSIS — R194 Change in bowel habit: Secondary | ICD-10-CM

## 2021-10-06 DIAGNOSIS — K224 Dyskinesia of esophagus: Secondary | ICD-10-CM | POA: Diagnosis not present

## 2021-10-06 DIAGNOSIS — K219 Gastro-esophageal reflux disease without esophagitis: Secondary | ICD-10-CM | POA: Insufficient documentation

## 2021-10-06 DIAGNOSIS — K449 Diaphragmatic hernia without obstruction or gangrene: Secondary | ICD-10-CM | POA: Diagnosis not present

## 2021-10-13 ENCOUNTER — Telehealth: Payer: Self-pay | Admitting: Gastroenterology

## 2021-10-13 ENCOUNTER — Encounter: Payer: Self-pay | Admitting: Gastroenterology

## 2021-10-13 NOTE — Telephone Encounter (Signed)
Called and spoke with patient in regards to barium swallow study results. Advised that Dr. Havery Moros sent her results via my chart as well, she did not have a chance to review. We reviewed the results and recommendations. Pt reports that the medications are currently helping her and she will call us back if anything changes. Pt verbalized understanding of all information and had no concerns at the end of the call.

## 2021-10-13 NOTE — Telephone Encounter (Signed)
error 

## 2021-10-14 DIAGNOSIS — M810 Age-related osteoporosis without current pathological fracture: Secondary | ICD-10-CM | POA: Diagnosis not present

## 2021-10-22 ENCOUNTER — Encounter: Payer: Self-pay | Admitting: Adult Health

## 2021-10-22 ENCOUNTER — Ambulatory Visit (INDEPENDENT_AMBULATORY_CARE_PROVIDER_SITE_OTHER): Payer: Medicare HMO | Admitting: Adult Health

## 2021-10-22 ENCOUNTER — Other Ambulatory Visit: Payer: Self-pay

## 2021-10-22 VITALS — BP 124/80 | HR 78 | Temp 97.8°F | Ht 63.0 in | Wt 87.8 lb

## 2021-10-22 DIAGNOSIS — J479 Bronchiectasis, uncomplicated: Secondary | ICD-10-CM | POA: Diagnosis not present

## 2021-10-22 DIAGNOSIS — R1310 Dysphagia, unspecified: Secondary | ICD-10-CM | POA: Diagnosis not present

## 2021-10-22 DIAGNOSIS — Z23 Encounter for immunization: Secondary | ICD-10-CM

## 2021-10-22 NOTE — Assessment & Plan Note (Addendum)
Appears stable.  Patient to continue on current regimen.  Unfortunately has been intolerant to any type of inhalers or flutter valve. Flu vaccine today.  Discussed COVID booster later this month  Plan  Patient Instructions  Flu shot today  Covid booster later this month.  Robitussin DM As needed  Cough  Activity as tolerated.  Follow up with Dr. Halford Chessman or Leahanna Buser NP in 3-4 months and As needed   Please contact office for sooner follow up if symptoms do not improve or worsen or seek emergency care

## 2021-10-22 NOTE — Assessment & Plan Note (Addendum)
Continue with aspiration precautions.  Continue on GERD treatment

## 2021-10-22 NOTE — Patient Instructions (Signed)
Flu shot today  Covid booster later this month.  Robitussin DM As needed  Cough  Activity as tolerated.  Follow up with Dr. Halford Chessman or Brucha Ahlquist NP in 3-4 months and As needed   Please contact office for sooner follow up if symptoms do not improve or worsen or seek emergency care

## 2021-10-22 NOTE — Progress Notes (Signed)
@Patient  ID: [name], female    DOB: [birth year], 80 y.o.   MRN: [id number]  Chief Complaint  Patient presents with   Follow-up    Referring provider: [name], MD  HPI: 80 year old female former smoker followed for bronchiectasis, recurrent aspiration pneumonia with history of reflux and history of cryptogenic organizing pneumonia  TEST/EVENTS :  PFT 12/31/09 >> FEV1 1.84(84%), FEV1% 73, TLC 4.94(98%), DLCO 77%, +BD. PFT 06/21/16 >> FEV1 1.22 (55%), FEV1% 76, TLC 5.29 (104%), DLCO 43% PFT 12/21/16 >> FEV1 1.49 (69%), FEV1% 78, TLC 5.42 (107%), RV 158%, DLCO 51%   CT chest 08/17/10 >> mild biapical scarring, basilar peribronchovascular nodularity and mild bronchiectasis HRCT chest 11/28/17 >> mild centrilobular emphysema, peribronchovascular nodularity, mild BTX, LLL consolidation CT chest 03/02/18 >> mild emphysema, areas of BTX, several nodular areas up to 1.4 cm CT chest 08/25/18 >> centrilobular emphysema, BTX with wall thickening and tree in bud RML/lingula/bilateral lower lobes, unchanged opacity LLL, new patchy opacities LUL  HRCT chest 09/21/19 >> apical scarring, diffuse BTX with peribronchovascular nodularity, areas of mucoid impaction, coronary calcification   Echo 10/08/19 >> EF 60 to 65%, mild LVH, grade 2 DD, mild MR, mild TR controlled.  10/22/2021 Follow up : Bronchiectasis Patient returns for a 20-month follow-up.  Overall says that she has been doing okay.  She has a daily cough but it seems to be under somewhat control.  She is had no fever or discolored mucus.  She remains very active and independent.  She lives at home and drives.  She exercises for at least 3 hours each day.  She does her stationary bike at home and also goes to the [facility].  She says this is the best thing that she does it seems to help her the most.  She says her weight has been stable  Barium swallow on November 22 showed tiny hiatal hernia, marked gastroesophageal reflux, mild esophageal  dysmotility and mild to moderate muscle dysfunction, mild reflux esophagitis.  No mass or stricture is noted.  Patient is maintained on Dexilant and Pepcid daily She says overall feels that her swallowing issues has been balanced recently with no flare. She has been intolerant to flutter valve, nebs or inhalers in the past.  Says she occasionally uses Robitussin or Tessalon if needed.  But has not used this lately  Allergies  Allergen Reactions   Cortisone     unsure   Other Other (See Comments)   Prednisone    Sulfasalazine Other (See Comments)    Unsure of reaction   Sulfonamide Derivatives     Unsure of reaction    Immunization History  Administered Date(s) Administered   Influenza Split 10/26/2010, 11/06/2011, 11/06/2012   Influenza Whole 10/29/2009, 08/15/2010   Influenza, High Dose Seasonal PF 12/09/2016, 08/25/2018, 11/22/2018, 08/16/2019, 10/19/2019   Influenza,inj,Quad PF,6+ Mos 10/15/2014, 11/22/2014, 11/19/2015   PFIZER(Purple Top)SARS-COV-2 Vaccination 01/08/2020, 02/06/2020, 10/10/2020   Pneumococcal Conjugate-13 09/17/2013, 09/20/2014   Pneumococcal Polysaccharide-23 08/28/1992, 08/28/2000, 10/29/2004, 09/14/2013, 01/08/2015   Tdap 09/13/2011   Zoster, Live 08/27/2009, 10/19/2019, 04/10/2020    Past Medical History:  Diagnosis Date   Allergy    Arthritis    Atherosclerosis of aorta ([place]) 2019   ct   BOOP (bronchiolitis obliterans with organizing pneumonia) ([place])    Bronchiectasis    Cataract    Esophageal dysmotility    Gastroparesis    GERD (gastroesophageal reflux disease)    severe   Iron deficiency anemia    Macular degeneration (senile) of  retina    Neuropathy    Nutcracker esophagus    Osteopenia    Pneumonia    Pulmonary fibrosis (HCC)    Raynaud's disease    Right lower lobe pneumonia 10/2012   Thrombocytosis     Tobacco History: Social History   Tobacco Use  Smoking Status Former   Packs/day: 0.25   Years: 10.00   Pack years: 2.50    Types: Cigarettes   Quit date: 11/15/1968   Years since quitting: 52.9  Smokeless Tobacco Never   Counseling given: Not Answered   Outpatient Medications Prior to Visit  Medication Sig Dispense Refill   Acetaminophen (TYLENOL EXTRA STRENGTH PO) Take 350 mg by mouth as needed.      benzonatate (TESSALON) 200 MG capsule TAKE 1 CAPSULE AS NEEDED FOR COUGH. 90 capsule 3   calcium-vitamin D (OSCAL WITH D) 500-200 MG-UNIT tablet Take 1 tablet by mouth.     dexlansoprazole (DEXILANT) 60 MG capsule Take 1 capsule (60 mg total) by mouth daily. 90 capsule 3   famotidine (PEPCID) 20 MG tablet Take 1 tablet (20 mg total) by mouth 2 (two) times daily. 180 tablet 3   gabapentin (NEURONTIN) 250 MG/5ML solution TAKE 2 ML AT BEDTIME. Pt takes as needed 180 mL 3   pseudoephedrine-guaifenesin (MUCINEX D) 60-600 MG 12 hr tablet Take 1 tablet by mouth daily.      Psyllium (METAMUCIL) 48.57 % POWD 1 tablespoon in water twice     sucralfate (CARAFATE) 1 g tablet TAKE 1 TABLET EVERY 6 HOURS AS NEEDED. MAY DISSOLVE 1 TABLET IN 1 TBSP OF DISTILLED WATER 60 tablet 2   Vitamin D, Ergocalciferol, 50000 units CAPS Take by mouth.     No facility-administered medications prior to visit.     Review of Systems:   Constitutional:   No  weight loss, night sweats,  Fevers, chills,  +fatigue, or  lassitude.  HEENT:   No headaches,  Difficulty swallowing,  Tooth/dental problems, or  Sore throat,                No sneezing, itching, ear ache, nasal congestion, post nasal drip,   CV:  No chest pain,  Orthopnea, PND, swelling in lower extremities, anasarca, dizziness, palpitations, syncope.   GI  No heartburn, indigestion, abdominal pain, nausea, vomiting, diarrhea, change in bowel habits, loss of appetite, bloody stools.   Resp: .  No chest wall deformity  Skin: no rash or lesions.  GU: no dysuria, change in color of urine, no urgency or frequency.  No flank pain, no hematuria   MS:  No joint pain or swelling.  No  decreased range of motion.  No back pain.    Physical Exam  BP 124/80 (BP Location: Left Arm, Patient Position: Sitting, Cuff Size: Normal)   Pulse 78   Temp 97.8 F (36.6 C) (Oral)   Ht 5\' 3"  (1.6 m)   Wt 87 lb 12.8 oz (39.8 kg)   SpO2 97%   BMI 15.55 kg/m   GEN: A/Ox3; pleasant , NAD, thin female    HEENT:  [place]/AT,  NOSE-clear, THROAT-clear, no lesions, no postnasal drip or exudate noted.   NECK:  Supple w/ fair ROM; no JVD; normal carotid impulses w/o bruits; no thyromegaly or nodules palpated; no lymphadenopathy.    RESP  Clear  P & A; w/o, wheezes/ rales/ or rhonchi. no accessory muscle use, no dullness to percussion  CARD:  RRR, no m/r/g, tr  peripheral edema, pulses intact, no cyanosis  or clubbing.  GI:   Soft & nt; nml bowel sounds; no organomegaly or masses detected.   Musco: Warm bil, no deformities or joint swelling noted.   Neuro: alert, no focal deficits noted.    Skin: Warm, no lesions or rashes    Lab Results:  CBC  BMET   BNP No results found for: BNP  ProBNP No results found for: PROBNP  Imaging: DG ESOPHAGUS W DOUBLE CM (HD)  Result Date: 10/06/2021 CLINICAL DATA:  Intermittent globus sensation in the throat with solids. Gastroesophageal reflux disease. Sore throat and hoarseness. EXAM: ESOPHOGRAM / BARIUM SWALLOW / BARIUM TABLET STUDY TECHNIQUE: Combined double contrast and single contrast examination performed using effervescent crystals, thick barium liquid, and thin barium liquid. The patient was observed with fluoroscopy swallowing a 13 mm barium sulphate tablet. FLUOROSCOPY TIME:  Fluoroscopy Time:  2 minutes 42 seconds Radiation Exposure Index (if provided by the fluoroscopic device): 9.4 mGy Number of Acquired Spot Images: 5 COMPARISON:  05/18/2020 CT abdomen/pelvis. FINDINGS: Normal oral and pharyngeal phases of swallowing, with no laryngeal penetration or tracheobronchial aspiration. No significant barium retention in the pharynx. No  evidence of pharyngeal mass, stricture or diverticulum. Mild to moderate cricopharyngeus muscle dysfunction, characterized by delayed and incomplete opening. Mild esophageal dysmotility, characterized by intermittent mild weakening of primary peristalsis throughout the thoracic esophagus. Tiny sliding hiatal hernia. Marked gastroesophageal reflux elicited to the level of the thoracic inlet with water siphon test. Mild granularity of the thoracic esophageal mucosa suggesting mild reflux esophagitis. Normal esophageal distensibility, with no evidence of esophageal mass, ulcer or stricture. Barium tablet traversed the esophagus into the stomach without significant delay. IMPRESSION: 1. Tiny sliding hiatal hernia. Marked gastroesophageal reflux elicited. 2. 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 3. Evidence of mild reflux esophagitis. No evidence of esophageal mass or stricture. Electronically Signed   By: Ilona Sorrel M.D.   On: 10/06/2021 10:59      PFT Results Latest Ref Rng & Units 12/21/2016 06/21/2016  FVC-Pre L 2.10 1.59  FVC-Predicted Pre % 72 54  FVC-Post L 1.91 1.32  FVC-Predicted Post % 66 45  Pre FEV1/FVC % % 66 76  Post FEV1/FCV % % 78 69  FEV1-Pre L 1.39 1.22  FEV1-Predicted Pre % 64 55  FEV1-Post L 1.49 0.92  DLCO uncorrected ml/min/mmHg 12.47 10.51  DLCO UNC% % 51 43  DLCO corrected ml/min/mmHg 11.89 11.45  DLCO COR %Predicted % 49 47  DLVA Predicted % 72 79  TLC L 5.42 5.29  TLC % Predicted % 107 104  RV % Predicted % 158 164    No results found for: NITRICOXIDE      Assessment & Plan:   BRONCHIECTASIS Appears stable.  Patient to continue on current regimen.  Unfortunately has been intolerant to any type of inhalers or flutter valve. Flu vaccine today.  Discussed COVID booster later this month  Plan  Patient Instructions  Flu shot today  Covid booster later this  month.  Robitussin DM As needed  Cough  Activity as tolerated.  Follow up with Dr. Halford Chessman or Mairin Lindsley NP in 3-4 months and As needed   Please contact office for sooner follow up if symptoms do not improve or worsen or seek emergency care         Dysphagia Continue with aspiration precautions.  Continue on GERD treatment     Rexene Edison, NP 10/22/2021

## 2021-10-22 NOTE — Progress Notes (Signed)
Reviewed and agree with assessment/plan.   Ollivander See, MD [facility] 10/22/2021, 5:18 PM Pager:  [phone]  

## 2021-11-06 ENCOUNTER — Other Ambulatory Visit: Payer: Self-pay | Admitting: Gastroenterology

## 2021-11-19 DIAGNOSIS — I700 Atherosclerosis of aorta: Secondary | ICD-10-CM | POA: Diagnosis not present

## 2021-11-19 DIAGNOSIS — Z79899 Other long term (current) drug therapy: Secondary | ICD-10-CM | POA: Diagnosis not present

## 2021-11-19 DIAGNOSIS — G603 Idiopathic progressive neuropathy: Secondary | ICD-10-CM | POA: Diagnosis not present

## 2021-11-19 DIAGNOSIS — Z0000 Encounter for general adult medical examination without abnormal findings: Secondary | ICD-10-CM | POA: Diagnosis not present

## 2021-11-19 DIAGNOSIS — E222 Syndrome of inappropriate secretion of antidiuretic hormone: Secondary | ICD-10-CM | POA: Diagnosis not present

## 2021-11-19 DIAGNOSIS — K224 Dyskinesia of esophagus: Secondary | ICD-10-CM | POA: Diagnosis not present

## 2021-11-19 DIAGNOSIS — J479 Bronchiectasis, uncomplicated: Secondary | ICD-10-CM | POA: Diagnosis not present

## 2021-11-19 DIAGNOSIS — J8410 Pulmonary fibrosis, unspecified: Secondary | ICD-10-CM | POA: Diagnosis not present

## 2021-11-29 DIAGNOSIS — J471 Bronchiectasis with (acute) exacerbation: Secondary | ICD-10-CM | POA: Diagnosis not present

## 2021-11-30 ENCOUNTER — Encounter: Payer: Self-pay | Admitting: Neurology

## 2021-11-30 ENCOUNTER — Other Ambulatory Visit: Payer: Self-pay

## 2021-11-30 ENCOUNTER — Ambulatory Visit (INDEPENDENT_AMBULATORY_CARE_PROVIDER_SITE_OTHER): Payer: Medicare HMO | Admitting: Neurology

## 2021-11-30 VITALS — BP 141/88 | HR 65 | Ht 64.0 in | Wt 88.2 lb

## 2021-11-30 DIAGNOSIS — R42 Dizziness and giddiness: Secondary | ICD-10-CM

## 2021-11-30 DIAGNOSIS — G629 Polyneuropathy, unspecified: Secondary | ICD-10-CM | POA: Diagnosis not present

## 2021-11-30 DIAGNOSIS — I872 Venous insufficiency (chronic) (peripheral): Secondary | ICD-10-CM

## 2021-11-30 MED ORDER — GABAPENTIN 250 MG/5ML PO SOLN: ORAL | 3 refills | Status: DC

## 2021-11-30 NOTE — Progress Notes (Signed)
NEUROLOGY FOLLOW UP OFFICE NOTE  [name] [id number] 1940/12/12  HISTORY OF PRESENT ILLNESS: I had the pleasure of seeing [name] in follow-up in the neurology clinic on 11/30/2021.  The patient was last seen 8 months ago for neuropathy. She also has severe venous insufficiency and plantar fasciitis. Since her last visit, she reports symptoms are "not improving," her balance is really bad. She continues to drive and states she is very much aware of how she is doing, she feels she is still okay using the gas pedals. She is on a very low dose of gabapentin, taking a trace amount dipping her finger in the solution. She has also been having dizziness. She reports upper respiratory and digestive issues do not help, but the other night she woke up and "everything was just twirling." It lasted all night and she could hardly walk. She felt nauseated. She started noticing symptoms last month and still feels some spinning sensation. She denies any headaches, vision changes, no falls.    HPI: This is a pleasant 81 yo RH woman with a history of GERD, nutcracker esophagus, pulmonary fibrosis, in her usual state of health until early June 2017 while walking into a grocery she suddenly had left temporal pain, then it felt like she was hit on both side of the head with a hammer. She reports she was "hit like gangbusters," she was very dizzy with a spinning sensation, nauseated. She held on to the cart for balance and had difficulty concentrating. Her vision was blurred. She managed to finish her chore and walked to the car, then noticed that she was having a hard time talking. She could see the sentence broken up in her head and could not get the words out. She was able to drive home with no focal symptoms noted. The episode lasted 30 minutes or so, she felt weak and tired after. Since then she has had at least 2 or 3 more episodes of headaches with dizziness, which did not progress to speech difficulties. The  symptoms would last around 5 minutes, no clear positional component to the dizziness. When she feels it coming on, she massages her head, which seems to help. She was unsure if Tylenol helped. She denies any prior history of headaches. She denies any vision loss, photo/phonophobia, diplopia, dysarthria, jaw claudication, hearing loss. She has occasional pulsatile tinnitus more in the afternoons. She has GERD which makes swallowing difficult. She has a history of neuropathy affecting both feet, R>L, with burning and numbness/tingling for several years. She has been doing laser therapy for venous insufficiency/varicose veins. She denies any falls but trips more. Sleep varies. She is very active and rides her bike for a couple of hours daily. She works at a preschool. Her mother had a stroke at age 14.   Diagnostic Data:  Venous dopplers showed chronic venous insufficiency, duplex showed limited valvular insufficiency.    EMG/NCV of the right UE and LE confirmed moderate chronic sensorimotor axonal polyneuropathy. There was also note of moderate right ulnar neuropathy with slowing across the elbow and mild right median neuropathy consistent with carpal tunnel syndrome.   EMG at [facility] showing mild sensory neuropathy and negative punch biopsy for small fiber neuropathy, no evidence of vasculitis.  She has also been evaluated by Rhuematology for Reynauds disease and by Vascular at [facility] last June 2021. Records were reviewed, she has severe venous insufficiency, mostly in the deep veins. It was discussed how this contributes to majority  of her symptoms, including neuropathy. There were severe skin changes compatible with stasis dermatitis and venous stasis. She was prescribed knee high compression stockings.   PAST MEDICAL HISTORY: Past Medical History:  Diagnosis Date   Allergy    Arthritis    Atherosclerosis of aorta ([place]) 2019   ct   BOOP (bronchiolitis obliterans with organizing  pneumonia) ([place])    Bronchiectasis    Cataract    Esophageal dysmotility    Gastroparesis    GERD (gastroesophageal reflux disease)    severe   Iron deficiency anemia    Macular degeneration (senile) of retina    Neuropathy    Nutcracker esophagus    Osteopenia    Pneumonia    Pulmonary fibrosis ([place])    Raynaud's disease    Right lower lobe pneumonia 10/2012   Thrombocytosis     MEDICATIONS: Current Outpatient Medications on File Prior to Visit  Medication Sig Dispense Refill   Acetaminophen (TYLENOL EXTRA STRENGTH PO) Take 350 mg by mouth as needed.      benzonatate (TESSALON) 200 MG capsule TAKE 1 CAPSULE AS NEEDED FOR COUGH. 90 capsule 3   calcium-vitamin D (OSCAL WITH D) 500-200 MG-UNIT tablet Take 1 tablet by mouth.     dexlansoprazole (DEXILANT) 60 MG capsule Take 1 capsule (60 mg total) by mouth daily. 90 capsule 3   famotidine (PEPCID) 20 MG tablet Take 1 tablet (20 mg total) by mouth 2 (two) times daily. 180 tablet 3   pseudoephedrine-guaifenesin (MUCINEX D) 60-600 MG 12 hr tablet Take 1 tablet by mouth daily.      sucralfate (CARAFATE) 1 g tablet TAKE 1 TABLET EVERY 6 HOURS AS NEEDED. MAY DISSOLVE 1 TABLET IN 1 TBSP OF DISTILLED WATER 60 tablet 2   gabapentin (NEURONTIN) 250 MG/5ML solution TAKE 2 ML AT BEDTIME. Pt takes as needed (Patient not taking: Reported on 11/30/2021) 180 mL 3   No current facility-administered medications on file prior to visit.    ALLERGIES: Allergies  Allergen Reactions   Cortisone     unsure   Other Other (See Comments)   Prednisone    Sulfasalazine Other (See Comments)    Unsure of reaction   Sulfonamide Derivatives     Unsure of reaction    FAMILY HISTORY: Family History  Problem Relation Age of Onset   Lymphoma Mother    Heart disease Father    Lung cancer Paternal Uncle    Heart disease Paternal Grandfather    Leukemia Paternal Grandmother    Cancer Maternal Grandmother        unknown type   Colon cancer Neg Hx     Esophageal cancer Neg Hx    Rectal cancer Neg Hx    Stomach cancer Neg Hx     SOCIAL HISTORY: Social History   Socioeconomic History   Marital status: Widowed    Spouse name: Not on file   Number of children: 2   Years of education: Not on file   Highest education level: Not on file  Occupational History   Occupation: retired Pharmacist, hospital    Comment: still teaching pre K   Tobacco Use   Smoking status: Former    Packs/day: 0.25    Years: 10.00    Pack years: 2.50    Types: Cigarettes    Quit date: 11/15/1968    Years since quitting: 53.0   Smokeless tobacco: Never  Vaping Use   Vaping Use: Never used  Substance and Sexual Activity   Alcohol use:  No   Drug use: No   Sexual activity: Not Currently    Partners: Male  Other Topics Concern   Not on file  Social History Narrative   Still working as a Print production planner at [employer]. - Retired 25yrs      Widowed      Lives alone   Lives one story townhouse      Right handed.   Social Determinants of Health   Financial Resource Strain: Not on file  Food Insecurity: Not on file  Transportation Needs: Not on file  Physical Activity: Not on file  Stress: Not on file  Social Connections: Not on file  Intimate Partner Violence: Not on file     PHYSICAL EXAM: Vitals:   11/30/21 1459  BP: (!) 141/88  Pulse: 65  SpO2: 98%   General: No acute distress Head:  Normocephalic/atraumatic Skin/Extremities: Venous stasis with purplish discoloration on both feet R>L, worse on the 1st and 5th digits Neurological Exam: alert and awake. No aphasia or dysarthria. Fund of knowledge is appropriate.  Attention and concentration are normal.   Cranial nerves: Pupils equal, round. Extraocular movements intact with no nystagmus. Visual fields full.  No facial asymmetry.  Motor: Bulk and tone normal, muscle strength 5/5 throughout except for 4/5 bilateral foot dorsiflexion and plantarflexion. Finger to nose testing intact.  Gait slow and  cautious, no ataxia.    IMPRESSION: This is a pleasant 81 yo RH woman with a history of GERD, nutcracker esophagus, pulmonary fibrosis, initially seen for transient episode of headache, vertigo, and expressive aphasia, MRI brain no acute changes. Her MRA head and neck was unremarkable except for moderate stenosis of the left P1 and diminished distal flow-related signal within the left PCA distribution. No further episodes since June 2017, continue daily aspirin and control of vascular risk factors. She has been following in our office for neuropathy, she is on a very low dose of gabapentin and would like to continue on same dose. She also has severe venous insufficiency, there appears to be more concerning changes, she was advised to follow-up with her Vascular surgeon. Continue to monitor driving. She will be referred for vestibular therapy for vertigo. Follow-up in 8 months, call for any changes.    Thank you for allowing me to participate in her care.  Please do not hesitate to call for any questions or concerns.    [name], M.D.   CC: Dr. Felipa Eth

## 2021-11-30 NOTE — Patient Instructions (Addendum)
Always good to see you!  Continue gabapentin as prescribed  2. Referral will be sent for Vestibular therapy  3. Call Dr. Shirlyn Goltz office to schedule follow-up for venous stasis [phone] )  4. Follow-up in 8 months, call for any changes

## 2021-12-02 IMAGING — DX DG CHEST 2V
2 series · 2 of 2 positions shown · non-contrast
Comparison: 11/24/2020

CLINICAL DATA: Follow-up pneumonia

EXAM:
CHEST - 2 VIEW

[chest pa]
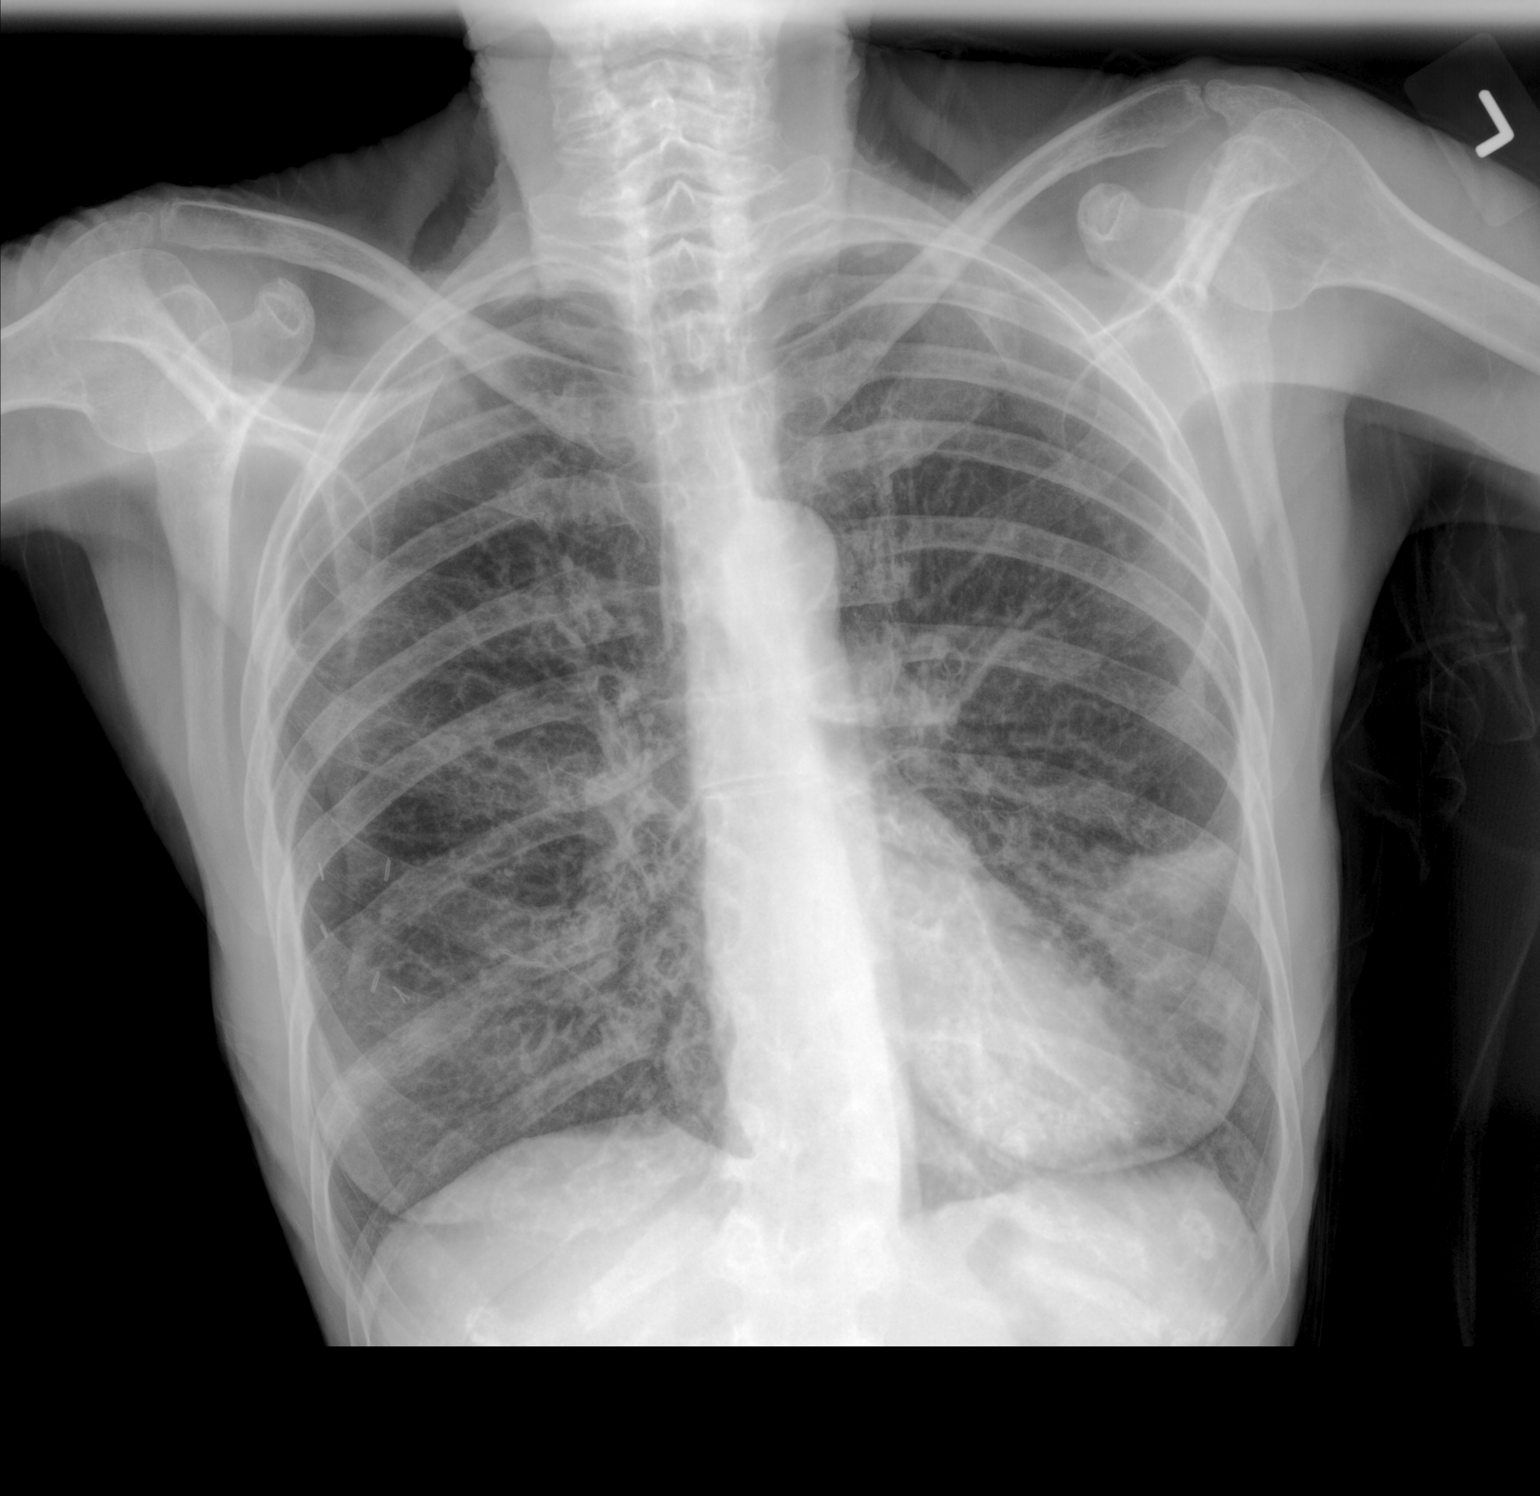

[chest lat]
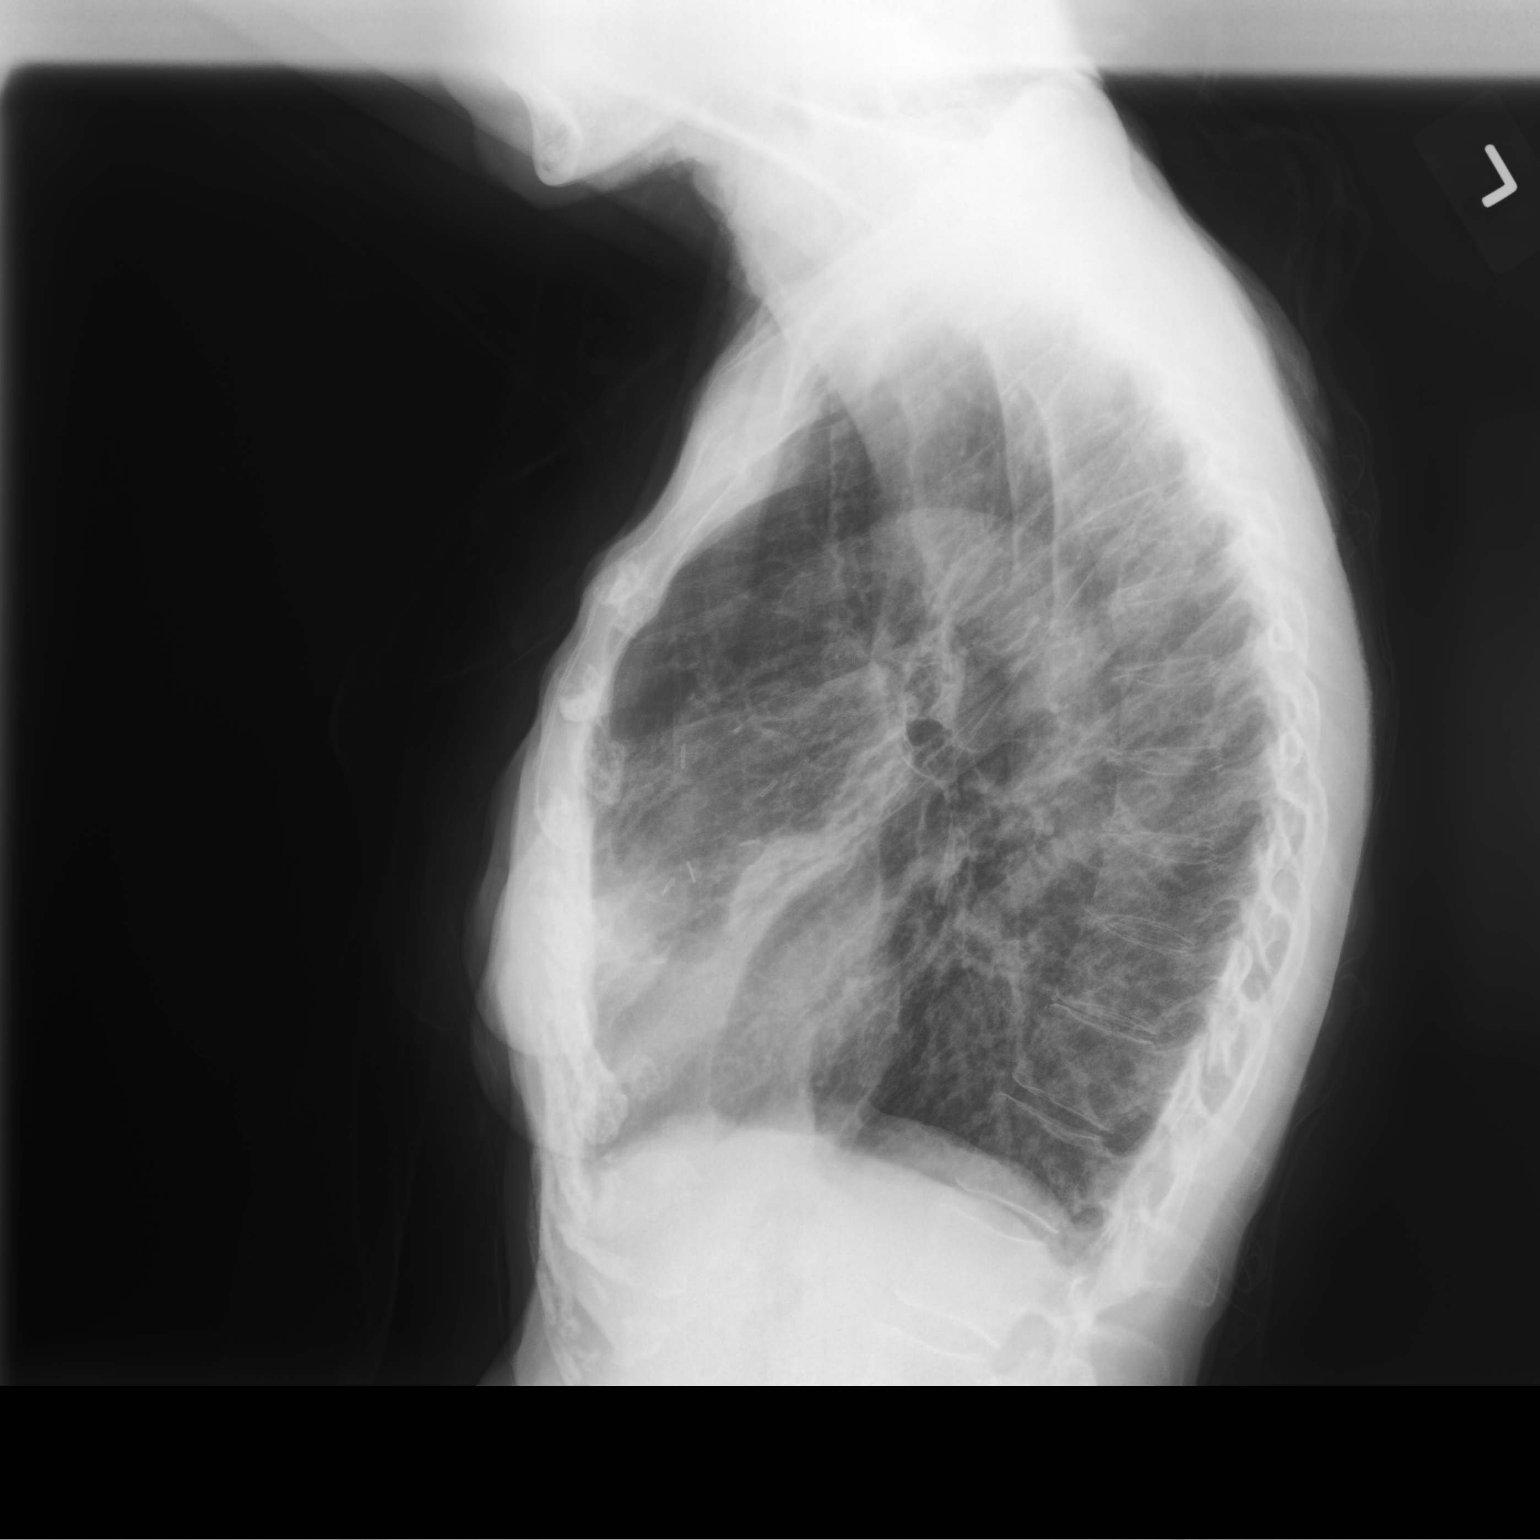

[2 of 2 positions shown; findings below may reference images not displayed]

FINDINGS: Cardiac shadow is within normal limits. The lungs are hyperinflated
but stable. Postsurgical changes in the right breast are noted.
Diffuse increased interstitial changes are again identified as well
as lingular infiltrates stable from the prior exam. No new focal
abnormality is noted.
IMPRESSION: Persistent lingular consolidation likely related to mucous plugging
as seen on prior chest x-ray and prior chest CT. Continued follow-up
is recommended.

## 2021-12-09 ENCOUNTER — Encounter: Payer: Self-pay | Admitting: Physical Therapy

## 2021-12-09 ENCOUNTER — Ambulatory Visit: Payer: Medicare HMO | Attending: Neurology | Admitting: Physical Therapy

## 2021-12-09 ENCOUNTER — Other Ambulatory Visit: Payer: Self-pay

## 2021-12-09 DIAGNOSIS — R42 Dizziness and giddiness: Secondary | ICD-10-CM | POA: Diagnosis not present

## 2021-12-09 DIAGNOSIS — R2681 Unsteadiness on feet: Secondary | ICD-10-CM | POA: Diagnosis not present

## 2021-12-09 NOTE — Therapy (Signed)
[facility] [place] [address] [place], [place], [address] Phone: [phone]   Fax:  [phone]  Physical Therapy Evaluation  Patient Details  Name: [name] MRN: [id number] Date of Birth: 02/11/1941 Referring Provider (PT): Ellouise Newer   Encounter Date: 12/09/2021   PT End of Session - 12/09/21 1316     Visit Number 1    Number of Visits 13    Date for PT Re-Evaluation 01/22/22    Authorization Type Aetna Medicare/BCBS    Progress Note Due on Visit 10    PT Start Time 1317    PT Stop Time 7262    PT Time Calculation (min) 48 min    Activity Tolerance Patient tolerated treatment well    Behavior During Therapy [facility] for tasks assessed/performed             Past Medical History:  Diagnosis Date   Allergy    Arthritis    Atherosclerosis of aorta ([place]) 2019   ct   BOOP (bronchiolitis obliterans with organizing pneumonia) ([place])    Bronchiectasis    Cataract    Esophageal dysmotility    Gastroparesis    GERD (gastroesophageal reflux disease)    severe   Iron deficiency anemia    Macular degeneration (senile) of retina    Neuropathy    Nutcracker esophagus    Osteopenia    Pneumonia    Pulmonary fibrosis ([place])    Raynaud's disease    Right lower lobe pneumonia 10/2012   Thrombocytosis     Past Surgical History:  Procedure Laterality Date   80 HOUR [place] STUDY N/A 10/13/2015   Procedure: 24 HOUR [place] STUDY;  Surgeon: Gatha Mayer, MD;  Location: [facility];  Service: Endoscopy;  Laterality: N/A;   BREAST LUMPECTOMY     left, x 2   CATARACT EXTRACTION     both eyes   CHOLECYSTECTOMY  2005   COLONOSCOPY     ESOPHAGEAL MANOMETRY N/A 10/06/2015   Procedure: ESOPHAGEAL MANOMETRY (EM);  Surgeon: Manus Gunning, MD;  [facility];  Service: Gastroenterology;  Laterality: N/A;   ESOPHAGEAL MANOMETRY N/A 03/10/2018   Procedure: ESOPHAGEAL MANOMETRY (EM);  Surgeon: Mauri Pole, MD;   [facility];  Service: Endoscopy;  Laterality: N/A;   ESOPHAGEAL MANOMETRY N/A 10/15/2020   Procedure: ESOPHAGEAL MANOMETRY (EM);  Surgeon: Lavena Bullion, DO;  [facility];  Service: Gastroenterology;  Laterality: N/A;   LUNG BIOPSY     vats right 2003   TOTAL ABDOMINAL HYSTERECTOMY     UPPER GASTROINTESTINAL ENDOSCOPY      There were no vitals filed for this visit.    Subjective Assessment - 12/09/21 1321     Subjective Pt reports today "feeling better".  Had therapy before for neuropathy.  Feels like the neuropathy has gotten worse in the past several years.  Balance is more difficult.  Had dizzy spells,    Pertinent History PMH:  neuropathy, plantar fascitis, GERD, pulmonary fibrosis, Raynaud's disease, arthritis, osteopenia    Patient Stated Goals Pt's goals for therapy are to have better balance, some relief.    Currently in Pain? Yes    Pain Score 7     Pain Location Foot    Pain Orientation Right    Pain Descriptors / Indicators Aching;Dull;Sharp;Burning   neuropathy pain   Pain Type Chronic pain    Pain Onset More than a month ago    Pain Frequency Constant  Aggravating Factors  unsure    Pain Relieving Factors wrapping ankle                [facility] PT Assessment - 12/09/21 1330       Assessment   Medical Diagnosis vertigo    Referring Provider (PT) Ellouise Newer    Onset Date/Surgical Date 11/20/21      Precautions   Precautions Fall      Balance Screen   Has the patient fallen in the past 6 months No    Has the patient had a decrease in activity level because of a fear of falling?  Yes    Is the patient reluctant to leave their home because of a fear of falling?  No      Prior Function   Leisure Exercises daily, uses exercise bike, cardio work at [employer]      Ambulation/Gait   Ambulation/Gait Yes    Ambulation/Gait Assistance 6: Modified independent (Device/Increase time)    Gait Comments Observed walking in and out of PT  session-guarded, slowed gait pattern                    Vestibular Assessment - 12/09/21 0001       Symptom Behavior   Subjective history of current problem Reports one night recently, she got up in the middle of the night for the bathroom and felt the room spinning; all night the symptoms persisted; gradually went away through the next day.    Type of Dizziness  Spinning    Frequency of Dizziness 1 episode of vertigo sensation; has had "lesser" episodes in the past    Duration of Dizziness recent episode 3-4 hours    Symptom Nature Motion provoked;Constant    Aggravating Factors Activity in general;No known aggravating factors    Relieving Factors Head stationary;Slow movements   Not moving   Progression of Symptoms Better   Resolved   History of similar episodes Pt reports similar episodes, but not as severe with room spinning in the past.  No hx of head trauma, recent illness.  One episode in her 45's with everything "whirling" and spinning.  At times, she reports feeling of unsteadiness with movement in daily activities, but it passes quickly.      Oculomotor Exam   Oculomotor Alignment Normal    Ocular ROM WFL    Spontaneous Absent    Gaze-induced  Absent    Head shaking Horizontal --   pt closes eyes-feels unsteadiness   Smooth Pursuits Saccades   horizontal, but no c/o symptoms   Saccades Intact      Vestibulo-Ocular Reflex   VOR 1 Head Only (x 1 viewing) performed x 3 reps, increased symptoms up to 7/10 "unsteadiness" horizontal, 7/10 same symptoms vertical    VOR Cancellation Corrective saccades   reports feeling lightheaded     Positional Testing   Dix-Hallpike Dix-Hallpike Right;Dix-Hallpike Left    Horizontal Canal Testing Horizontal Canal Right;Horizontal Canal Left      Dix-Hallpike Right   Dix-Hallpike Right Duration <30 sec    Dix-Hallpike Right Symptoms No nystagmus   feels "pressure behind eyes and lightheaded"; c/o lightheaded upon return to sit      Dix-Hallpike Left   Dix-Hallpike Left Duration <30 sec    Dix-Hallpike Left Symptoms No nystagmus   c/o lightheadedness     Horizontal Canal Right   Horizontal Canal Right Duration 60 sec    Horizontal Canal Right Symptoms Normal   No nystagmuch;  c/o lightheaded and unsteady, >5/10 rating and it doesn't subside until out of this position     Horizontal Canal Left   Horizontal Canal Left Duration 60 sec    Horizontal Canal Left Symptoms Normal   No nystagmus; pt reports increased unsteadiness in this position >5/10, does not subside until she sits up               Objective measurements completed on examination: See above findings.                PT Education - 12/09/21 1618     Education Details PT eval results, POC    Person(s) Educated Patient    Methods Explanation    Comprehension Verbalized understanding              PT Short Term Goals - 12/09/21 1628       PT SHORT TERM GOAL #1   Title The patient will be indep with HEP for balance, decreased dizziness reports, and general mobility.  TARGET 01/08/2022    Time 4    Period Weeks    Status New      PT SHORT TERM GOAL #2   Title The patient will tolerate gaze x 1 viewing x 30 seconds at self regulated pace.    Time 4    Period Weeks    Status New               PT Long Term Goals - 12/09/21 1630       PT LONG TERM GOAL #1   Title Pt will be independent with progression of HEP to address balance, mobility, and decreased dizziness.  TARGET 01/22/2022    Time 6    Period Weeks    Status New      PT LONG TERM GOAL #2   Title The patient will tolerate gaze x 1 viewing x 60 seconds at self regulated pace for decreased dizziness and increased activity participation.    Time 6    Period Weeks    Status New      PT LONG TERM GOAL #3   Title Further balance testing to be assessed and goals to be written as appropriate.    Time 6    Period Weeks    Status New                     Plan - 12/09/21 1619     Clinical Impression Statement Pt is a 80 year old female who presents to OPPT with hx of neuropathy and possible vertigo-due to recent episode of "spinning sensation" during the night.  Pt has reported some similar sensations, which she reports being more unsteadiness with changes in movement/positions during her daily activities.  With oculomotor testing, pt does not tolerate x1 viewing or VOR/head impulse test at appropriate speed or repetitions.  Despite slowed motion, she either closes her eyes or requests to stop because it brings on the dizziness sensation.  With positional testing for BPPV in horizontal and posterior canals, pt does not have any noted nystagmus, but she has reports of lightheadedness or tension in head.  With return to sit, these symptoms resolve.  She appears to have decreased VOR as well as motion sensitivity, and she does endorse overall moving slower and less in the past few months to avoid these feelings of unsteadiness.  She would benefit from skilled PT to address vestibular and oculomotor symptoms and to further assess balance for  improved functional mobility.    Personal Factors and Comorbidities Comorbidity 3+    Comorbidities PMH:  neuropathy, plantar fascitis, GERD, pulmonary fibrosis, Raynaud's disease, arthritis, osteopenia, hx of dry eye, per pt report    Examination-Activity Limitations Locomotion Level;Transfers;Bed Mobility;Stand    Examination-Participation Restrictions Community Activity    Stability/Clinical Decision Making Evolving/Moderate complexity    Clinical Decision Making Moderate    Rehab Potential Good    PT Frequency 2x / week    PT Duration 6 weeks   plus eval   PT Treatment/Interventions ADLs/Self Care Home Management;Gait training;Functional mobility training;Therapeutic activities;Therapeutic exercise;Balance training;Neuromuscular re-education;DME Instruction;Patient/family education;Vestibular    PT  Next Visit Plan Establish HEP to work on VOR, x 1 viewing, exercises to address motion sensitivity; when apporpirate, assess balance and gait more formally and set goals    Consulted and Agree with Plan of Care Patient             Patient will benefit from skilled therapeutic intervention in order to improve the following deficits and impairments:  Abnormal gait, Difficulty walking, Decreased balance, Decreased mobility, Dizziness  Visit Diagnosis: Dizziness and giddiness  Unsteadiness on feet     Problem List Patient Active Problem List   Diagnosis Date Noted   Iron deficiency anemia 09/25/2021   Senile purpura ([place]) 09/25/2021   Syndrome of inappropriate secretion of antidiuretic hormone ([place]) 09/25/2021   Macular degeneration 03/18/2021   Choroidal nevus, left eye 01/01/2021   Early stage nonexudative age-related macular degeneration of both eyes 01/01/2021   Thrombocytosis 12/08/2020   Bronchiolectasis ([place]) 11/27/2020   Hardening of the aorta (main artery of the heart) ([place]) 11/27/2020   Idiopathic progressive polyneuropathy 11/27/2020   Intestinal malabsorption 11/27/2020   Malnutrition of mild degree Altamease Oiler: 75% to less than 90% of standard weight) ([place]) 11/27/2020   Pulmonary fibrosis ([place]) 11/27/2020   Urge incontinence of urine 11/27/2020   Protein calorie malnutrition ([place]) 11/24/2020   Left epiretinal membrane 04/01/2020   Posterior vitreous detachment of both eyes 04/01/2020   Burning sensation of feet 02/17/2020   Venous insufficiency 01/02/2019   Carpal tunnel syndrome of right wrist 11/24/2018   Sore throat 12/08/2017   Hoarseness 12/08/2017   Abnormal CT scan 12/06/2017   Laryngopharyngeal reflux (LPR) 10/03/2017   Temporomandibular jaw dysfunction 10/03/2017   Tension-type headache, not intractable 09/12/2017   Abdominal pain 04/28/2017   Other fatigue 04/28/2017   Pneumonia 12/14/2016   Neuropathy 07/30/2016   TIA (transient ischemic attack)  06/22/2016   New onset of headaches after age 16 06/22/2016   Dysphagia    Allergic reaction 01/11/2015   Arthritis of right ankle 10/09/2013   Pes cavus 10/09/2013   Loss of transverse plantar arch 10/09/2013   Allergic rhinitis 05/14/2013   BRONCHIECTASIS 12/15/2010   Nutcracker esophagus 01/26/2008   GERD 01/26/2008   GASTROPARESIS 01/26/2008    Adrian Specht W., PT 12/09/2021, 4:33 PM  [facility] [address], [place], [place], [address] Phone: [phone]   Fax:  [phone]  Name: [name] MRN: [id number] Date of Birth: 18-Jul-1941

## 2021-12-10 NOTE — Addendum Note (Signed)
Addended by: Frazier Butt on: 12/10/2021 10:25 AM   Modules accepted: Orders

## 2021-12-14 ENCOUNTER — Ambulatory Visit: Payer: Medicare HMO | Admitting: Physical Therapy

## 2021-12-14 ENCOUNTER — Encounter: Payer: Self-pay | Admitting: Physical Therapy

## 2021-12-14 ENCOUNTER — Other Ambulatory Visit: Payer: Self-pay

## 2021-12-14 DIAGNOSIS — R42 Dizziness and giddiness: Secondary | ICD-10-CM | POA: Diagnosis not present

## 2021-12-14 DIAGNOSIS — R2681 Unsteadiness on feet: Secondary | ICD-10-CM | POA: Diagnosis not present

## 2021-12-14 NOTE — Therapy (Signed)
[facility] [place] [place], [place], [place], [address] Phone: [phone]   Fax:  [phone]  Physical Therapy Treatment  Patient Details  Name: [name] MRN: [id number] Date of Birth: 03-Oct-1941 Referring Provider (PT): Ellouise Newer   Encounter Date: 12/14/2021   PT End of Session - 12/14/21 1408     Visit Number 2    Number of Visits 13    Date for PT Re-Evaluation 01/22/22    Authorization Type Aetna Medicare/BCBS    Progress Note Due on Visit 10    PT Start Time 1325   pt late   PT Stop Time 1401    PT Time Calculation (min) 36 min    Equipment Utilized During Treatment Gait belt    Activity Tolerance Patient tolerated treatment well    Behavior During Therapy [facility] for tasks assessed/performed             Past Medical History:  Diagnosis Date   Allergy    Arthritis    Atherosclerosis of aorta ([place]) 2019   ct   BOOP (bronchiolitis obliterans with organizing pneumonia) ([place])    Bronchiectasis    Cataract    Esophageal dysmotility    Gastroparesis    GERD (gastroesophageal reflux disease)    severe   Iron deficiency anemia    Macular degeneration (senile) of retina    Neuropathy    Nutcracker esophagus    Osteopenia    Pneumonia    Pulmonary fibrosis ([place])    Raynaud's disease    Right lower lobe pneumonia 10/2012   Thrombocytosis     Past Surgical History:  Procedure Laterality Date   29 HOUR [place] STUDY N/A 10/13/2015   Procedure: 24 HOUR [place] STUDY;  Surgeon: Gatha Mayer, MD;  [facility];  Service: Endoscopy;  Laterality: N/A;   BREAST LUMPECTOMY     left, x 2   CATARACT EXTRACTION     both eyes   CHOLECYSTECTOMY  2005   COLONOSCOPY     ESOPHAGEAL MANOMETRY N/A 10/06/2015   Procedure: ESOPHAGEAL MANOMETRY (EM);  Surgeon: Manus Gunning, MD;  [facility];  Service: Gastroenterology;  Laterality: N/A;   ESOPHAGEAL MANOMETRY N/A 03/10/2018   Procedure: ESOPHAGEAL  MANOMETRY (EM);  Surgeon: Mauri Pole, MD;  [facility];  Service: Endoscopy;  Laterality: N/A;   ESOPHAGEAL MANOMETRY N/A 10/15/2020   Procedure: ESOPHAGEAL MANOMETRY (EM);  Surgeon: Lavena Bullion, DO;  [facility];  Service: Gastroenterology;  Laterality: N/A;   LUNG BIOPSY     vats right 2003   TOTAL ABDOMINAL HYSTERECTOMY     UPPER GASTROINTESTINAL ENDOSCOPY      There were no vitals filed for this visit.   Subjective Assessment - 12/14/21 1324     Subjective Doing fine. No new changes. Dizziness is about the same. Notes that she feels lightheaded a lot, which is different from her sensation of dizziness. Denies taking BP meds.    Pertinent History PMH:  neuropathy, plantar fascitis, GERD, pulmonary fibrosis, Raynaud's disease, arthritis, osteopenia    Patient Stated Goals Pt's goals for therapy are to have better balance, some relief.    Currently in Pain? No/denies                [facility] PT Assessment - 12/14/21 0001       Standardized Balance Assessment   Standardized Balance Assessment Dynamic Gait Index      Dynamic Gait Index  Level Surface Mild Impairment    Change in Gait Speed Moderate Impairment    Gait with Horizontal Head Turns Moderate Impairment    Gait with Vertical Head Turns Moderate Impairment    Gait and Pivot Turn Moderate Impairment    Step Over Obstacle Severe Impairment    Step Around Obstacles Mild Impairment    DGI comment: assessment not completed d/t time                            Vestibular Treatment/Exercise - 12/14/21 0001       Vestibular Treatment/Exercise   Habituation Exercises Nestor Lewandowsky    Gaze Exercises X1 Viewing Horizontal;X1 Viewing Vertical;Eye/Head Exercise Horizontal      Nestor Lewandowsky   Number of Reps  1    Symptom Description  PT assistance to increase speed; c/o dizziness and lightheadedness upon sitting up, lightheadedness when sitting down      X1 Viewing  Horizontal   Foot Position sitting    Reps --   30"   Comments 5/10 dizziness      X1 Viewing Vertical   Foot Position sitting    Reps --   30"   Comments 5/10 dizziness      Eye/Head Exercise Horizontal   Foot Position sitting    Reps --   30"   Comments 5/10 dizziness; slow                    PT Education - 12/14/21 1406     Education Details HEP- Access Code: [email or web address] ; edu on HEP safety and benefits of vestibular rehab and intended level of dizziness with HEP    Person(s) Educated Patient    Methods Explanation;Demonstration;Tactile cues;Verbal cues;Handout    Comprehension Verbalized understanding;Returned demonstration              PT Short Term Goals - 12/14/21 1410       PT SHORT TERM GOAL #1   Title The patient will be indep with HEP for balance, decreased dizziness reports, and general mobility.  TARGET 01/08/2022    Time 4    Period Weeks    Status On-going      PT SHORT TERM GOAL #2   Title The patient will tolerate gaze x 1 viewing x 30 seconds at self regulated pace.    Time 4    Period Weeks    Status On-going   reports 5/10 dizziness              PT Long Term Goals - 12/14/21 1410       PT LONG TERM GOAL #1   Title Pt will be independent with progression of HEP to address balance, mobility, and decreased dizziness.  TARGET 01/22/2022    Time 6    Period Weeks    Status On-going      PT LONG TERM GOAL #2   Title The patient will tolerate gaze x 1 viewing x 60 seconds at self regulated pace for decreased dizziness and increased activity participation.    Time 6    Period Weeks    Status On-going      PT LONG TERM GOAL #3   Title Patient to score 16/24 on DGI in order to decrease risk of falls.    Time 6    Period Weeks    Status New  Plan - 12/14/21 1409     Clinical Impression Statement Patient arrived to session without new complaints. Provided edu on benefits of vestibular rehab and  intended level of dizziness with HEP. Initiated VOR and habituation exercises with cues to increase speed of movement while maintaining symptoms below moderate threshold. Began assessing DGI, which did bring on some c/o dizziness. Patient reported understanding of HEP and edu on safety with HEP. No complaints at end of session.    Comorbidities PMH:  neuropathy, plantar fascitis, GERD, pulmonary fibrosis, Raynaud's disease, arthritis, osteopenia, hx of dry eye, per pt report    PT Treatment/Interventions ADLs/Self Care Home Management;Gait training;Functional mobility training;Therapeutic activities;Therapeutic exercise;Balance training;Neuromuscular re-education;DME Instruction;Patient/family education;Vestibular    PT Next Visit Plan reasses HEP to work on VOR, x 1 viewing, exercises to address motion sensitivity; when apporpirate, complete DGI    Consulted and Agree with Plan of Care Patient             Patient will benefit from skilled therapeutic intervention in order to improve the following deficits and impairments:  Abnormal gait, Difficulty walking, Decreased balance, Decreased mobility, Dizziness  Visit Diagnosis: Dizziness and giddiness  Unsteadiness on feet     Problem List Patient Active Problem List   Diagnosis Date Noted   Iron deficiency anemia 09/25/2021   Senile purpura ([place]) 09/25/2021   Syndrome of inappropriate secretion of antidiuretic hormone ([place]) 09/25/2021   Macular degeneration 03/18/2021   Choroidal nevus, left eye 01/01/2021   Early stage nonexudative age-related macular degeneration of both eyes 01/01/2021   Thrombocytosis 12/08/2020   Bronchiolectasis ([place]) 11/27/2020   Hardening of the aorta (main artery of the heart) ([place]) 11/27/2020   Idiopathic progressive polyneuropathy 11/27/2020   Intestinal malabsorption 11/27/2020   Malnutrition of mild degree Altamease Oiler: 75% to less than 90% of standard weight) ([place]) 11/27/2020   Pulmonary fibrosis ([place])  11/27/2020   Urge incontinence of urine 11/27/2020   Protein calorie malnutrition ([place]) 11/24/2020   Left epiretinal membrane 04/01/2020   Posterior vitreous detachment of both eyes 04/01/2020   Burning sensation of feet 02/17/2020   Venous insufficiency 01/02/2019   Carpal tunnel syndrome of right wrist 11/24/2018   Sore throat 12/08/2017   Hoarseness 12/08/2017   Abnormal CT scan 12/06/2017   Laryngopharyngeal reflux (LPR) 10/03/2017   Temporomandibular jaw dysfunction 10/03/2017   Tension-type headache, not intractable 09/12/2017   Abdominal pain 04/28/2017   Other fatigue 04/28/2017   Pneumonia 12/14/2016   Neuropathy 07/30/2016   TIA (transient ischemic attack) 06/22/2016   New onset of headaches after age 21 06/22/2016   Dysphagia    Allergic reaction 01/11/2015   Arthritis of right ankle 10/09/2013   Pes cavus 10/09/2013   Loss of transverse plantar arch 10/09/2013   Allergic rhinitis 05/14/2013   BRONCHIECTASIS 12/15/2010   Nutcracker esophagus 01/26/2008   GERD 01/26/2008   GASTROPARESIS 01/26/2008    Janene Harvey, PT, DPT 12/14/21 2:12 PM   [facility] [address] [place], [place], [address] Phone: [phone]   Fax:  [phone]  Name: ALVAH GILDER MRN: [id number] Date of Birth: [birth year]

## 2021-12-14 NOTE — Patient Instructions (Signed)
Access Code: [email or web address] URL: [email or web address] Date: 12/14/2021 Prepared by: [facility]  Exercises Seated Gaze Stabilization with Head Rotation - 1 x daily - 5 x weekly - 2 sets - 30 sec hold Seated Gaze Stabilization with Head Nod - 1 x daily - 5 x weekly - 2 sets - 30 sec hold Brandt-Daroff Vestibular Exercise - 1 x daily - 5 x weekly - 2 sets - 3 reps

## 2021-12-17 ENCOUNTER — Ambulatory Visit: Payer: Medicare HMO | Admitting: Rehabilitative and Restorative Service Providers"

## 2021-12-18 ENCOUNTER — Other Ambulatory Visit: Payer: Self-pay | Admitting: Gastroenterology

## 2021-12-21 ENCOUNTER — Other Ambulatory Visit: Payer: Self-pay

## 2021-12-21 ENCOUNTER — Encounter: Payer: Self-pay | Admitting: Physical Therapy

## 2021-12-21 ENCOUNTER — Ambulatory Visit: Payer: Medicare HMO | Attending: Neurology | Admitting: Physical Therapy

## 2021-12-21 DIAGNOSIS — R2681 Unsteadiness on feet: Secondary | ICD-10-CM | POA: Insufficient documentation

## 2021-12-21 DIAGNOSIS — R42 Dizziness and giddiness: Secondary | ICD-10-CM | POA: Insufficient documentation

## 2021-12-21 NOTE — Therapy (Signed)
[facility], [place], [place], [address] Phone: [phone]   Fax:  [phone]  Physical Therapy Treatment  Patient Details  Name: [name] MRN: [id number] Date of Birth: [birth year] Referring Provider (PT): Ellouise Newer   Encounter Date: 12/21/2021   PT End of Session - 12/21/21 1518     Visit Number 3    Number of Visits 13    Date for PT Re-Evaluation 01/22/22    Authorization Type Aetna Medicare/BCBS    Progress Note Due on Visit 10    PT Start Time 1314    PT Stop Time 4580    PT Time Calculation (min) 49 min    Equipment Utilized During Treatment Gait belt    Activity Tolerance Patient tolerated treatment well    Behavior During Therapy [facility] for tasks assessed/performed             Past Medical History:  Diagnosis Date   Allergy    Arthritis    Atherosclerosis of aorta ([place]) 2019   ct   BOOP (bronchiolitis obliterans with organizing pneumonia) ([place])    Bronchiectasis    Cataract    Esophageal dysmotility    Gastroparesis    GERD (gastroesophageal reflux disease)    severe   Iron deficiency anemia    Macular degeneration (senile) of retina    Neuropathy    Nutcracker esophagus    Osteopenia    Pneumonia    Pulmonary fibrosis ([place])    Raynaud's disease    Right lower lobe pneumonia 10/2012   Thrombocytosis     Past Surgical History:  Procedure Laterality Date   60 HOUR [place] STUDY N/A 10/13/2015   Procedure: 24 HOUR [place] STUDY;  Surgeon: Gatha Mayer, MD;  Location: [facility];  Service: Endoscopy;  Laterality: N/A;   BREAST LUMPECTOMY     left, x 2   CATARACT EXTRACTION     both eyes   CHOLECYSTECTOMY  2005   COLONOSCOPY     ESOPHAGEAL MANOMETRY N/A 10/06/2015   Procedure: ESOPHAGEAL MANOMETRY (EM);  Surgeon: Manus Gunning, MD;  [facility];  Service: Gastroenterology;  Laterality: N/A;   ESOPHAGEAL MANOMETRY N/A 03/10/2018   Procedure: ESOPHAGEAL MANOMETRY  (EM);  Surgeon: Mauri Pole, MD;  [facility];  Service: Endoscopy;  Laterality: N/A;   ESOPHAGEAL MANOMETRY N/A 10/15/2020   Procedure: ESOPHAGEAL MANOMETRY (EM);  Surgeon: Lavena Bullion, DO;  [facility];  Service: Gastroenterology;  Laterality: N/A;   LUNG BIOPSY     vats right 2003   TOTAL ABDOMINAL HYSTERECTOMY     UPPER GASTROINTESTINAL ENDOSCOPY      There were no vitals filed for this visit.   Subjective Assessment - 12/21/21 1314     Subjective Doing well. Doing better today- it could be the sunshine. Did not do her HEP because she does not feel comfortable doing them at home as she lives alone.    Pertinent History PMH:  neuropathy, plantar fascitis, GERD, pulmonary fibrosis, Raynaud's disease, arthritis, osteopenia    Patient Stated Goals Pt's goals for therapy are to have better balance, some relief.    Currently in Pain? No/denies                [facility] PT Assessment - 12/21/21 0001       Dynamic Gait Index   Level Surface Mild Impairment    Change in Gait Speed Moderate Impairment    Gait with  Horizontal Head Turns Moderate Impairment    Gait with Vertical Head Turns Moderate Impairment    Gait and Pivot Turn Moderate Impairment    Step Over Obstacle Severe Impairment    Step Around Obstacles Mild Impairment    Steps Mild Impairment    Total Score 10                 Vestibular Assessment - 12/21/21 0001       Orthostatics   BP supine (x 5 minutes) 132/80    BP sitting 122/70    BP standing (after 1 minute) 121/78    BP standing (after 3 minutes) 110/80                      [facility] Adult PT Treatment/Exercise - 12/21/21 0001       Neuro Re-ed    Neuro Re-ed Details  alt toe tap one 6" with variable UE support, side step over cone 10x each   apprehension to taking away UE support            Vestibular Treatment/Exercise - 12/21/21 0001       X1 Viewing Horizontal   Foot Position sitting     Reps --   30"   Comments cues to avoid stopping at midline      X1 Viewing Vertical   Foot Position sitting    Reps --   30"                   PT Education - 12/21/21 1515     Education Details update to HEP- Access Code: [email or web address]; edu on orthostasis and ways to control it, edu on benefits of habituation for dizziness, and benefits of balance exercises without UEs to work on [employer]er, [employer]ellery) Educated Patient    Methods Explanation;Demonstration;Tactile cues;Verbal cues;Handout    Comprehension Returned demonstration;Verbalized understanding              PT Short Term Goals - 12/14/21 1410       PT SHORT TERM GOAL #1   Title The patient will be indep with HEP for balance, decreased dizziness reports, and general mobility.  TARGET 01/08/2022    Time 4    Period Weeks    Status On-going      PT SHORT TERM GOAL #2   Title The patient will tolerate gaze x 1 viewing x 30 seconds at self regulated pace.    Time 4    Period Weeks    Status On-going   reports 5/10 dizziness              PT Long Term Goals - 12/14/21 1410       PT LONG TERM GOAL #1   Title Pt will be independent with progression of HEP to address balance, mobility, and decreased dizziness.  TARGET 01/22/2022    Time 6    Period Weeks    Status On-going      PT LONG TERM GOAL #2   Title The patient will tolerate gaze x 1 viewing x 60 seconds at self regulated pace for decreased dizziness and increased activity participation.    Time 6    Period Weeks    Status On-going      PT LONG TERM GOAL #3   Title Patient to score 16/24 on DGI in order to decrease risk of falls.    Time 6    Period Weeks  Status New                   Plan - 12/21/21 1518     Clinical Impression Statement Patient arrived to session with report of feeling well today. Reports noncompliance with HEP d/t not feeling comfortable doing them at home as she lives alone. Orthostatics were assessed  which revealed significant orthostasis with supine>sit, thus educated on controlling it with proper hydration and use of ankle pumps before transfers. Patient scored 10/24 on DGI, indicating increased risk of falls. Most challenge occurred with head nods/turns with gait and stepping over obstacle. Patient refuses to perform habituation exercises even after education on importance and benefit. Opted for balance activities instead, with heavy encouragement to avoid UE support throughout for max challenge and benefit. Patient tolerated session well and without complaints upon leaving.    Comorbidities PMH:  neuropathy, plantar fascitis, GERD, pulmonary fibrosis, Raynaud's disease, arthritis, osteopenia, hx of dry eye, per pt report    PT Treatment/Interventions ADLs/Self Care Home Management;Gait training;Functional mobility training;Therapeutic activities;Therapeutic exercise;Balance training;Neuromuscular re-education;DME Instruction;Patient/family education;Vestibular    PT Next Visit Plan reasses HEP to work on VOR, x 1 viewing, exercises to address motion sensitivity; when apporpirate    Consulted and Agree with Plan of Care Patient             Patient will benefit from skilled therapeutic intervention in order to improve the following deficits and impairments:  Abnormal gait, Difficulty walking, Decreased balance, Decreased mobility, Dizziness  Visit Diagnosis: Dizziness and giddiness  Unsteadiness on feet     Problem List Patient Active Problem List   Diagnosis Date Noted   Iron deficiency anemia 09/25/2021   Senile purpura ([place]) 09/25/2021   Syndrome of inappropriate secretion of antidiuretic hormone ([place]) 09/25/2021   Macular degeneration 03/18/2021   Choroidal nevus, left eye 01/01/2021   Early stage nonexudative age-related macular degeneration of both eyes 01/01/2021   Thrombocytosis 12/08/2020   Bronchiolectasis ([place]) 11/27/2020   Hardening of the aorta (main artery of the  heart) ([place]) 11/27/2020   Idiopathic progressive polyneuropathy 11/27/2020   Intestinal malabsorption 11/27/2020   Malnutrition of mild degree Altamease Oiler: 75% to less than 90% of standard weight) ([place]) 11/27/2020   Pulmonary fibrosis ([place]) 11/27/2020   Urge incontinence of urine 11/27/2020   Protein calorie malnutrition ([place]) 11/24/2020   Left epiretinal membrane 04/01/2020   Posterior vitreous detachment of both eyes 04/01/2020   Burning sensation of feet 02/17/2020   Venous insufficiency 01/02/2019   Carpal tunnel syndrome of right wrist 11/24/2018   Sore throat 12/08/2017   Hoarseness 12/08/2017   Abnormal CT scan 12/06/2017   Laryngopharyngeal reflux (LPR) 10/03/2017   Temporomandibular jaw dysfunction 10/03/2017   Tension-type headache, not intractable 09/12/2017   Abdominal pain 04/28/2017   Other fatigue 04/28/2017   Pneumonia 12/14/2016   Neuropathy 07/30/2016   TIA (transient ischemic attack) 06/22/2016   New onset of headaches after age 39 06/22/2016   Dysphagia    Allergic reaction 01/11/2015   Arthritis of right ankle 10/09/2013   Pes cavus 10/09/2013   Loss of transverse plantar arch 10/09/2013   Allergic rhinitis 05/14/2013   BRONCHIECTASIS 12/15/2010   Nutcracker esophagus 01/26/2008   GERD 01/26/2008   GASTROPARESIS 01/26/2008    Janene Harvey, PT, DPT 12/21/21 3:20 PM   [facility] [address] [place], [place], [address] Phone: [phone]   Fax:  [phone]  Name: LEXXIE WINBERG MRN: [id number] Date of  Birth: 23-Nov-1940

## 2021-12-23 DIAGNOSIS — I872 Venous insufficiency (chronic) (peripheral): Secondary | ICD-10-CM | POA: Diagnosis not present

## 2021-12-23 DIAGNOSIS — I75023 Atheroembolism of bilateral lower extremities: Secondary | ICD-10-CM | POA: Insufficient documentation

## 2021-12-23 DIAGNOSIS — M79604 Pain in right leg: Secondary | ICD-10-CM | POA: Insufficient documentation

## 2021-12-23 DIAGNOSIS — M79605 Pain in left leg: Secondary | ICD-10-CM | POA: Diagnosis not present

## 2021-12-23 DIAGNOSIS — R1310 Dysphagia, unspecified: Secondary | ICD-10-CM | POA: Diagnosis not present

## 2021-12-24 ENCOUNTER — Ambulatory Visit: Payer: Medicare HMO | Admitting: Physical Therapy

## 2021-12-24 ENCOUNTER — Other Ambulatory Visit: Payer: Self-pay

## 2021-12-24 ENCOUNTER — Encounter: Payer: Self-pay | Admitting: Physical Therapy

## 2021-12-24 DIAGNOSIS — R42 Dizziness and giddiness: Secondary | ICD-10-CM

## 2021-12-24 DIAGNOSIS — R2681 Unsteadiness on feet: Secondary | ICD-10-CM | POA: Diagnosis not present

## 2021-12-24 NOTE — Therapy (Addendum)
[facility] [address], [address] [place], [place], [address] Phone: [phone]   Fax:  [phone]  Physical Therapy Treatment  Patient Details  Name: [name] MRN: [id number] Date of Birth: Nov 27, 1940 Referring Provider (PT): Patrcia Dolly  Progress Note Reporting Period 12/09/21 to 12/24/21  See note below for Objective Data and Assessment of Progress/Goals.     Encounter Date: 12/24/2021   PT End of Session - 12/24/21 1443     Visit Number 4    Number of Visits 13    Date for PT Re-Evaluation 01/22/22    Authorization Type Aetna Medicare/BCBS    Progress Note Due on Visit 10    PT Start Time 1404    PT Stop Time 1442    PT Time Calculation (min) 38 min    Equipment Utilized During Treatment Gait belt    Activity Tolerance Patient tolerated treatment well    Behavior During Therapy WFL for tasks assessed/performed             Past Medical History:  Diagnosis Date   Allergy    Arthritis    Atherosclerosis of aorta ([facility]) 2019   ct   BOOP (bronchiolitis obliterans with organizing pneumonia) ([facility])    Bronchiectasis    Cataract    Esophageal dysmotility    Gastroparesis    GERD (gastroesophageal reflux disease)    severe   Iron deficiency anemia    Macular degeneration (senile) of retina    Neuropathy    Nutcracker esophagus    Osteopenia    Pneumonia    Pulmonary fibrosis ([facility])    Raynaud's disease    Right lower lobe pneumonia 10/2012   Thrombocytosis     Past Surgical History:  Procedure Laterality Date   69 HOUR PH STUDY N/A 10/13/2015   Procedure: 24 HOUR [place] STUDY;  Surgeon: Iva Boop, MD;  [facility];  Service: Endoscopy;  Laterality: N/A;   BREAST LUMPECTOMY     left, x 2   CATARACT EXTRACTION     both eyes   CHOLECYSTECTOMY  2005   COLONOSCOPY     ESOPHAGEAL MANOMETRY N/A 10/06/2015   Procedure: ESOPHAGEAL MANOMETRY (EM);  Surgeon: Ruffin Frederick, MD;  [facility];  Service: Gastroenterology;  Laterality: N/A;   ESOPHAGEAL MANOMETRY N/A 03/10/2018   Procedure: ESOPHAGEAL MANOMETRY (EM);  Surgeon: Napoleon Form, MD;  [facility];  Service: Endoscopy;  Laterality: N/A;   ESOPHAGEAL MANOMETRY N/A 10/15/2020   Procedure: ESOPHAGEAL MANOMETRY (EM);  Surgeon: Shellia Cleverly, DO;  [facility];  Service: Gastroenterology;  Laterality: N/A;   LUNG BIOPSY     vats right 2003   TOTAL ABDOMINAL HYSTERECTOMY     UPPER GASTROINTESTINAL ENDOSCOPY      There were no vitals filed for this visit.   Subjective Assessment - 12/24/21 1401     Subjective Doing good. No issues with new HEP.    Pertinent History PMH:  neuropathy, plantar fascitis, GERD, pulmonary fibrosis, Raynaud's disease, arthritis, osteopenia    Patient Stated Goals Pt's goals for therapy are to have better balance, some relief.    Currently in Pain? No/denies                               [facility] Adult PT Treatment/Exercise - 12/24/21 0001       Neuro Re-ed    Neuro Re-ed Details  alt toe tap on 4" and 6" step with CGA x12 each, sidestepping an backwards walking with CGA 79x 74ft; R/L fwd/back stepping over 1/2 foam roll 10x- 1 UE support on chair when R foot leading             Vestibular Treatment/Exercise - 12/24/21 0001       X1 Viewing Horizontal   Foot Position standing, romberg    Reps --   30"   Comments c/o wooziness, c/o slight imbalance      X1 Viewing Vertical   Foot Position standing, romberg    Reps --   30"   Comments c/o wooziness, c/o imbalance                Balance Exercises - 12/24/21 0001       Balance Exercises: Standing   Turning Right;Left;5 reps;Limitations   c/o feeling "insecure in my head"   Turning Limitations 1/2 turns to targets                PT Education - 12/24/21 1443     Education Details update to HEP- Access Code: [email or web address]    Person(s) Educated Patient    Methods  Explanation;Demonstration;Tactile cues;Verbal cues;Handout    Comprehension Returned demonstration;Verbalized understanding              PT Short Term Goals - 12/14/21 1410       PT SHORT TERM GOAL #1   Title The patient will be indep with HEP for balance, decreased dizziness reports, and general mobility.  TARGET 01/08/2022    Time 4    Period Weeks    Status On-going      PT SHORT TERM GOAL #2   Title The patient will tolerate gaze x 1 viewing x 30 seconds at self regulated pace.    Time 4    Period Weeks    Status On-going   reports 5/10 dizziness              PT Long Term Goals - 12/24/21 1545       PT LONG TERM GOAL #1   Title Pt will be independent with progression of HEP to address balance, mobility, and decreased dizziness.  TARGET 01/22/2022    Time 6    Period Weeks    Status On-going      PT LONG TERM GOAL #2   Title The patient will tolerate gaze x 1 viewing x 60 seconds at self regulated pace for decreased dizziness and increased activity participation.    Time 6    Period Weeks    Status On-going      PT LONG TERM GOAL #3   Title Patient to score 16/24 on DGI in order to decrease risk of falls.    Time 6    Period Weeks    Status On-going                   Plan - 12/24/21 1443     Clinical Impression Statement Patient arrived to session without new issues. Able to progress VOR activities to standing with good tolerance. Slightly more unsteadiness with narrow foot positioning and with vertical vs. horizontal VOR. Patient very slow and hesitant when performing turns and sideways/backwards walking, requiring encouragement to increase speed and amplitude of movement. Required min-mod assist to maintain stability with stepping strategy activities, ultimately allowed 1 UE support for improved safety. Updated HEP with activities that were well-tolerated today. Patient reported understanding and without  complaints at end of session.     Comorbidities PMH:  neuropathy, plantar fascitis, GERD, pulmonary fibrosis, Raynaud's disease, arthritis, osteopenia, hx of dry eye, per pt report    PT Treatment/Interventions ADLs/Self Care Home Management;Gait training;Functional mobility training;Therapeutic activities;Therapeutic exercise;Balance training;Neuromuscular re-education;DME Instruction;Patient/family education;Vestibular    PT Next Visit Plan reasses HEP to work on VOR, x 1 viewing, exercises to address motion sensitivity; when apporpirate    Consulted and Agree with Plan of Care Patient             Patient will benefit from skilled therapeutic intervention in order to improve the following deficits and impairments:  Abnormal gait, Difficulty walking, Decreased balance, Decreased mobility, Dizziness  Visit Diagnosis: Dizziness and giddiness  Unsteadiness on feet     Problem List Patient Active Problem List   Diagnosis Date Noted   Iron deficiency anemia 09/25/2021   Senile purpura (HCC) 09/25/2021   Syndrome of inappropriate secretion of antidiuretic hormone (HCC) 09/25/2021   Macular degeneration 03/18/2021   Choroidal nevus, left eye 01/01/2021   Early stage nonexudative age-related macular degeneration of both eyes 01/01/2021   Thrombocytosis 12/08/2020   Bronchiolectasis (HCC) 11/27/2020   Hardening of the aorta (main artery of the heart) (HCC) 11/27/2020   Idiopathic progressive polyneuropathy 11/27/2020   Intestinal malabsorption 11/27/2020   Malnutrition of mild degree Lily Kocher: 75% to less than 90% of standard weight) (HCC) 11/27/2020   Pulmonary fibrosis (HCC) 11/27/2020   Urge incontinence of urine 11/27/2020   Protein calorie malnutrition ([facility]) 11/24/2020   Left epiretinal membrane 04/01/2020   Posterior vitreous detachment of both eyes 04/01/2020   Burning sensation of feet 02/17/2020   Venous insufficiency 01/02/2019   Carpal tunnel syndrome of right wrist 11/24/2018   Sore throat 12/08/2017    Hoarseness 12/08/2017   Abnormal CT scan 12/06/2017   Laryngopharyngeal reflux (LPR) 10/03/2017   Temporomandibular jaw dysfunction 10/03/2017   Tension-type headache, not intractable 09/12/2017   Abdominal pain 04/28/2017   Other fatigue 04/28/2017   Pneumonia 12/14/2016   Neuropathy 07/30/2016   TIA (transient ischemic attack) 06/22/2016   New onset of headaches after age 88 06/22/2016   Dysphagia    Allergic reaction 01/11/2015   Arthritis of right ankle 10/09/2013   Pes cavus 10/09/2013   Loss of transverse plantar arch 10/09/2013   Allergic rhinitis 05/14/2013   BRONCHIECTASIS 12/15/2010   Nutcracker esophagus 01/26/2008   GERD 01/26/2008   GASTROPARESIS 01/26/2008    Anette Guarneri, PT, DPT 12/24/21 3:47 PM   [facility] [address], [address] [place], [place], [address] Phone: [phone]   Fax:  [phone]  Name: PERFECTA DUFAULT MRN: [id number] Date of Birth: December 01, 1940   PHYSICAL THERAPY DISCHARGE SUMMARY  Visits from Start of Care: 4  Current functional level related to goals / functional outcomes: Unable to assess; patient did not return   Remaining deficits: Unable to assess   Education / Equipment: HEP  Plan: Patient agrees to discharge.  Patient goals were not met. Patient is being discharged due to not returning to PT.     Anette Guarneri, PT, DPT 03/08/22 9:07 AM

## 2021-12-25 ENCOUNTER — Telehealth: Payer: Self-pay | Admitting: Neurology

## 2021-12-25 NOTE — Telephone Encounter (Signed)
Patient has 5 more visits for PT. She has to canx them due to other doctor appts she has. She wants to know if she calls them to canx, or if our office does. She needs to know today, if you could call her.

## 2021-12-25 NOTE — Telephone Encounter (Signed)
Pt called an advised that she needs to call and cancel her PT appointments. She said that PT wasn't really helping and she thinks that the vascular test are more important

## 2021-12-28 ENCOUNTER — Encounter: Payer: Self-pay | Admitting: Podiatry

## 2021-12-28 ENCOUNTER — Ambulatory Visit: Payer: Medicare HMO | Admitting: Physical Therapy

## 2021-12-28 ENCOUNTER — Ambulatory Visit (INDEPENDENT_AMBULATORY_CARE_PROVIDER_SITE_OTHER): Payer: Medicare HMO | Admitting: Podiatry

## 2021-12-28 ENCOUNTER — Other Ambulatory Visit: Payer: Self-pay

## 2021-12-28 DIAGNOSIS — B351 Tinea unguium: Secondary | ICD-10-CM | POA: Diagnosis not present

## 2021-12-28 DIAGNOSIS — G629 Polyneuropathy, unspecified: Secondary | ICD-10-CM

## 2021-12-28 DIAGNOSIS — M79675 Pain in left toe(s): Secondary | ICD-10-CM | POA: Diagnosis not present

## 2021-12-28 DIAGNOSIS — L84 Corns and callosities: Secondary | ICD-10-CM

## 2021-12-28 DIAGNOSIS — M79674 Pain in right toe(s): Secondary | ICD-10-CM

## 2021-12-30 ENCOUNTER — Ambulatory Visit: Payer: Medicare HMO | Admitting: Physical Therapy

## 2021-12-31 ENCOUNTER — Other Ambulatory Visit: Payer: Self-pay | Admitting: Gastroenterology

## 2022-01-01 ENCOUNTER — Telehealth: Payer: Self-pay | Admitting: Gastroenterology

## 2022-01-01 NOTE — Telephone Encounter (Signed)
Please advise 

## 2022-01-01 NOTE — Telephone Encounter (Signed)
Inbound call from patient stating dexilant medication is too expensive and is requesting alternate medication to be sent to pharmacy in chart please.

## 2022-01-01 NOTE — Telephone Encounter (Signed)
Does she have a preference of an alternative? She has been on other PPIs in the past - can use protonix, omeprazole, nexium, , 40mg  BID up to her.  Thanks

## 2022-01-01 NOTE — Telephone Encounter (Signed)
Left message for patient to call office.  

## 2022-01-03 NOTE — Progress Notes (Signed)
Subjective: [name] is a 81 y.o. female patient seen today for follow up of  at risk foot care with history of peripheral neuropathy and corn(s) left 3rd toe and painful thick toenails that are difficult to trim. Painful toenails interfere with ambulation. Aggravating factors include wearing enclosed shoe gear. Pain is relieved with periodic professional debridement. Painful corns are aggravated when weightbearing when wearing enclosed shoe gear. Pain is relieved with periodic professional debridement..   New problem(s)/concern(s) today: None    PCP is [name], MD. Last visit was: 09/16/2021.  She voices no new pedal concerns on today's visit.  Allergies  Allergen Reactions   Cortisone     unsure   Other Other (See Comments)   Prednisone    Sulfasalazine Other (See Comments)    Unsure of reaction   Sulfonamide Derivatives     Unsure of reaction    Objective: Physical Exam  General: Patient is a pleasant 81 y.o. Caucasian female WD, WN in NAD. AAO x 3.   Neurovascular Examination: Neurovascular status intact bilateral lower extremities. Capillary refill time to digits immediate b/l. Palpable pedal pulses b/l LE. Pedal hair sparse. No pain with calf compression b/l. Lower extremity skin temperature gradient within normal limits. No edema noted b/l LE. No cyanosis or clubbing noted b/l LE.  Protective sensation decreased with 10 gram monofilament b/l.  Dermatological:  Pedal skin warm and supple b/l.  No open wounds b/l. No interdigital macerations. Toenails 1-5 b/l elongated, thickened, discolored with subungual debris. +Tenderness with dorsal palpation of nailplates. Hyperkeratotic lesion(s) noted L 3rd toe.   Musculoskeletal:  Normal muscle strength 5/5 to all lower extremity muscle groups bilaterally. Hammertoe deformity noted 2-5 b/l.[name] No pain, crepitus or joint limitation noted with ROM b/l LE.  Patient ambulates independently without assistive  aids.  Assessment: 1. Pain due to onychomycosis of toenails of both feet   2. Corns   3. Peripheral polyneuropathy   Plan: Patient was evaluated and treated and all questions answered. Consent given for treatment as described below: -Patient to continue soft, supportive shoe gear daily. -Mycotic toenails 1-5 bilaterally were debrided in length and girth with sterile nail nippers and dremel without incident. -Corn(s) L 3rd toe pared utilizing sterile scalpel blade without complication or incident. Total number debrided=1. -Patient/POA to call should there be question/concern in the interim.  Return in about 3 months (around 03/27/2022).  Marzetta Board, DPM

## 2022-01-04 ENCOUNTER — Ambulatory Visit: Payer: Medicare HMO | Admitting: Physical Therapy

## 2022-01-04 MED ORDER — OMEPRAZOLE 40 MG PO CPDR: 40.0000 mg | DELAYED_RELEASE_CAPSULE | Freq: Two times a day (BID) | ORAL | 2 refills | Status: DC

## 2022-01-04 NOTE — Telephone Encounter (Signed)
Patient is returning the call  

## 2022-01-04 NOTE — Addendum Note (Signed)
Addended by: Roetta Sessions on: 01/04/2022 04:09 PM   Modules accepted: Orders

## 2022-01-04 NOTE — Telephone Encounter (Signed)
Called and spoke to patient. She will check with her pharmacy and see which would be most affordable and let us know so we can send a prescription

## 2022-01-06 ENCOUNTER — Ambulatory Visit: Payer: Medicare HMO | Admitting: Physical Therapy

## 2022-01-06 DIAGNOSIS — I8003 Phlebitis and thrombophlebitis of superficial vessels of lower extremities, bilateral: Secondary | ICD-10-CM | POA: Diagnosis not present

## 2022-01-06 DIAGNOSIS — I872 Venous insufficiency (chronic) (peripheral): Secondary | ICD-10-CM | POA: Diagnosis not present

## 2022-01-06 DIAGNOSIS — M79605 Pain in left leg: Secondary | ICD-10-CM | POA: Diagnosis not present

## 2022-01-06 DIAGNOSIS — M79604 Pain in right leg: Secondary | ICD-10-CM | POA: Diagnosis not present

## 2022-01-07 ENCOUNTER — Ambulatory Visit (INDEPENDENT_AMBULATORY_CARE_PROVIDER_SITE_OTHER): Payer: Medicare HMO | Admitting: Ophthalmology

## 2022-01-07 ENCOUNTER — Other Ambulatory Visit: Payer: Self-pay

## 2022-01-07 ENCOUNTER — Encounter (INDEPENDENT_AMBULATORY_CARE_PROVIDER_SITE_OTHER): Payer: Self-pay | Admitting: Ophthalmology

## 2022-01-07 DIAGNOSIS — M1990 Unspecified osteoarthritis, unspecified site: Secondary | ICD-10-CM | POA: Diagnosis not present

## 2022-01-07 DIAGNOSIS — D3132 Benign neoplasm of left choroid: Secondary | ICD-10-CM | POA: Diagnosis not present

## 2022-01-07 DIAGNOSIS — R636 Underweight: Secondary | ICD-10-CM | POA: Diagnosis not present

## 2022-01-07 DIAGNOSIS — H35372 Puckering of macula, left eye: Secondary | ICD-10-CM | POA: Diagnosis not present

## 2022-01-07 DIAGNOSIS — K2100 Gastro-esophageal reflux disease with esophagitis, without bleeding: Secondary | ICD-10-CM | POA: Diagnosis not present

## 2022-01-07 DIAGNOSIS — N3941 Urge incontinence: Secondary | ICD-10-CM | POA: Diagnosis not present

## 2022-01-07 DIAGNOSIS — Z681 Body mass index (BMI) 19 or less, adult: Secondary | ICD-10-CM | POA: Diagnosis not present

## 2022-01-07 DIAGNOSIS — R030 Elevated blood-pressure reading, without diagnosis of hypertension: Secondary | ICD-10-CM | POA: Diagnosis not present

## 2022-01-07 DIAGNOSIS — J8410 Pulmonary fibrosis, unspecified: Secondary | ICD-10-CM | POA: Diagnosis not present

## 2022-01-07 DIAGNOSIS — Z823 Family history of stroke: Secondary | ICD-10-CM | POA: Diagnosis not present

## 2022-01-07 DIAGNOSIS — K2970 Gastritis, unspecified, without bleeding: Secondary | ICD-10-CM | POA: Diagnosis not present

## 2022-01-07 DIAGNOSIS — Z008 Encounter for other general examination: Secondary | ICD-10-CM | POA: Diagnosis not present

## 2022-01-07 DIAGNOSIS — H04123 Dry eye syndrome of bilateral lacrimal glands: Secondary | ICD-10-CM | POA: Diagnosis not present

## 2022-01-07 DIAGNOSIS — H353131 Nonexudative age-related macular degeneration, bilateral, early dry stage: Secondary | ICD-10-CM | POA: Diagnosis not present

## 2022-01-07 DIAGNOSIS — G629 Polyneuropathy, unspecified: Secondary | ICD-10-CM | POA: Diagnosis not present

## 2022-01-07 DIAGNOSIS — Z809 Family history of malignant neoplasm, unspecified: Secondary | ICD-10-CM | POA: Diagnosis not present

## 2022-01-07 DIAGNOSIS — Z8249 Family history of ischemic heart disease and other diseases of the circulatory system: Secondary | ICD-10-CM | POA: Diagnosis not present

## 2022-01-07 NOTE — Assessment & Plan Note (Signed)
Very early findings, not high risk for developing wet AMD

## 2022-01-07 NOTE — Assessment & Plan Note (Signed)
Follow up as scheduled.  

## 2022-01-07 NOTE — Assessment & Plan Note (Signed)
Small in the macular region no interval change

## 2022-01-07 NOTE — Progress Notes (Signed)
01/07/2022     CHIEF COMPLAINT Patient presents for  Chief Complaint  Patient presents with   Retina Follow Up      HISTORY OF PRESENT ILLNESS: [name] is a 81 y.o. female who presents to the clinic today for:   HPI     Retina Follow Up           Diagnosis: Other (ERM)   Laterality: left eye   Onset: 1 year ago   Course: gradually improving         Comments   1 yr fu OU oct fp.  Pt states she has had a laser treatment done 3 times by Dr. Ellie Lunch and his has improved her vision. She states now it seems like her vision is more blurred. Pt states "at night it is scary if I am out because it is so blurred."      Last edited by Laurin Coder on 01/07/2022 11:08 AM.      Referring physician: Luberta Mutter, MD [place],  [place] [address]  HISTORICAL INFORMATION:   Selected notes from the [facility]: No current outpatient medications on file. (Ophthalmic Drugs)   No current facility-administered medications for this visit. (Ophthalmic Drugs)   Current Outpatient Medications (Other)  Medication Sig   Acetaminophen (TYLENOL EXTRA STRENGTH PO) Take 350 mg by mouth as needed.    acetaminophen (TYLENOL) 500 MG tablet Take 1 tablet by mouth daily.   benzonatate (TESSALON) 200 MG capsule TAKE 1 CAPSULE AS NEEDED FOR COUGH.   calcium-vitamin D (OSCAL WITH D) 500-200 MG-UNIT tablet Take 1 tablet by mouth.   famotidine (PEPCID) 20 MG tablet Take 1 tablet (20 mg total) by mouth 2 (two) times daily.   folic acid (FOLVITE) 1 MG tablet Take 1 tablet by mouth daily.   gabapentin (NEURONTIN) 250 MG/5ML solution TAKE 2 ML AT BEDTIME. Pt takes as needed   omeprazole (PRILOSEC) 40 MG capsule Take 1 capsule (40 mg total) by mouth 2 (two) times daily.   pseudoephedrine-guaifenesin (MUCINEX D) 60-600 MG 12 hr tablet Take 1 tablet by mouth daily.    sucralfate (CARAFATE) 1 g tablet TAKE ONE TABLET EVERY 6 HOURS AS NEEDED.  (MAY DISOLVE 1 TABLET IN 1 T IN 1 TABLESPOON OF DISTILLED WATER.)   No current facility-administered medications for this visit. (Other)      REVIEW OF SYSTEMS:    ALLERGIES Allergies  Allergen Reactions   Cortisone     unsure   Other Other (See Comments)   Prednisone    Sulfasalazine Other (See Comments)    Unsure of reaction   Sulfonamide Derivatives     Unsure of reaction    PAST MEDICAL HISTORY Past Medical History:  Diagnosis Date   Allergy    Arthritis    Atherosclerosis of aorta ([place]) 2019   ct   BOOP (bronchiolitis obliterans with organizing pneumonia) ([place])    Bronchiectasis    Cataract    Esophageal dysmotility    Gastroparesis    GERD (gastroesophageal reflux disease)    severe   Iron deficiency anemia    Macular degeneration (senile) of retina    Neuropathy    Nutcracker esophagus    Osteopenia    Pneumonia    Pulmonary fibrosis ([place])    Raynaud's disease    Right lower lobe pneumonia 10/2012   Thrombocytosis    Past Surgical History:  Procedure Laterality Date  [place] STUDY N/A 10/13/2015   Procedure: [place] STUDY;  Surgeon: Gatha Mayer, MD;  Location: [facility];  Service: Endoscopy;  Laterality: N/A;   BREAST LUMPECTOMY     left, x 2   CATARACT EXTRACTION     both eyes   CHOLECYSTECTOMY  2005   COLONOSCOPY     ESOPHAGEAL MANOMETRY N/A 10/06/2015   Procedure: ESOPHAGEAL MANOMETRY (EM);  Surgeon: Manus Gunning, MD;  Location: [facility];  Service: Gastroenterology;  Laterality: N/A;   ESOPHAGEAL MANOMETRY N/A 03/10/2018   Procedure: ESOPHAGEAL MANOMETRY (EM);  Surgeon: Mauri Pole, MD;  Location: [facility];  Service: Endoscopy;  Laterality: N/A;   ESOPHAGEAL MANOMETRY N/A 10/15/2020   Procedure: ESOPHAGEAL MANOMETRY (EM);  Surgeon: Lavena Bullion, DO;  Location: [facility];  Service: Gastroenterology;  Laterality: N/A;   LUNG BIOPSY     vats right 2003   TOTAL ABDOMINAL HYSTERECTOMY     UPPER  GASTROINTESTINAL ENDOSCOPY      FAMILY HISTORY Family History  Problem Relation Age of Onset   Lymphoma Mother    Heart disease Father    Lung cancer Paternal Uncle    Heart disease Paternal Grandfather    Leukemia Paternal Grandmother    Cancer Maternal Grandmother        unknown type   Colon cancer Neg Hx    Esophageal cancer Neg Hx    Rectal cancer Neg Hx    Stomach cancer Neg Hx     SOCIAL HISTORY Social History   Tobacco Use   Smoking status: Former    Packs/day: 0.25    Years: 10.00    Pack years: 2.50    Types: Cigarettes    Quit date: 11/15/1968    Years since quitting: 53.1   Smokeless tobacco: Never  Vaping Use   Vaping Use: Never used  Substance Use Topics   Alcohol use: No   Drug use: No         OPHTHALMIC EXAM:  Base Eye Exam     Visual Acuity (ETDRS)       Right Left   Dist cc 20/50 -2 20/40 +2   Dist ph cc NI NI    Correction: Glasses         Tonometry (Tonopen, 11:12 AM)       Right Left   Pressure 15 16         Pupils       Pupils Dark Light APD   Right PERRL 3 3 None   Left PERRL 3 3 None         Extraocular Movement       Right Left    Full Full         Neuro/Psych     Oriented x3: Yes   Mood/Affect: Normal         Dilation     Both eyes: 1.0% Mydriacyl, 2.5% Phenylephrine @ 11:12 AM           Slit Lamp and Fundus Exam     External Exam       Right Left   External Normal Normal         Slit Lamp Exam       Right Left   Lids/Lashes Normal Normal   Conjunctiva/Sclera White and quiet White and quiet   Cornea Clear Clear   Anterior Chamber Deep and quiet Deep and quiet   Iris Round and reactive Round and reactive   Lens  Posterior chamber intraocular lens Posterior chamber intraocular lens   Anterior Vitreous Normal Normal         Fundus Exam       Right Left   Posterior Vitreous Posterior vitreous detachment Posterior vitreous detachment   Disc Normal Normal   C/D Ratio 0.3 0.25    Macula Hard drusen, no macular thickening, Early age related macular degeneration Epiretinal membrane moderate topographic distortion, Choroidal nevus 1Da, Hard drusen, Epiretinal membrane, mild   Vessels Normal Normal   Periphery Normal Normal            IMAGING AND PROCEDURES  Imaging and Procedures for 01/07/22  OCT, Retina - OU - Both Eyes       Right Eye Quality was good. Scan locations included subfoveal. Central Foveal Thickness: 288. Progression has been stable. Findings include normal observations.   Left Eye Quality was good. Scan locations included subfoveal. Central Foveal Thickness: 338. Progression has been stable. Findings include epiretinal membrane.   Notes OD and OS stable over time,  Epiretinal membrane bridging the foveal umbo OS, no impact on acuity will observe     Color Fundus Photography Optos - OU - Both Eyes       Right Eye Progression has been stable. Disc findings include normal observations. Macula : normal observations. Vessels : normal observations. Periphery : normal observations.   Left Eye Progression has been stable. Disc findings include normal observations. Macula : normal observations. Vessels : normal observations. Periphery : normal observations.   Notes OU with minor early ARMD.  No topographic distortion from the minor epiretinal membrane OS             ASSESSMENT/PLAN:  Bilateral dry eyes Follow-up as scheduled  Early stage nonexudative age-related macular degeneration of both eyes Very early findings, not high risk for developing wet AMD  Left epiretinal membrane Minor OS with no foveal distortion and particularly no outer retinal distortion thus not impactful in acuity  Choroidal nevus, left eye Small in the macular region no interval change     ICD-10-CM   1. Left epiretinal membrane  H35.372 OCT, Retina - OU - Both Eyes    Color Fundus Photography Optos - OU - Both Eyes    2. Bilateral dry eyes   H04.123     3. Early stage nonexudative age-related macular degeneration of both eyes  H35.3131     4. Choroidal nevus, left eye  D31.32       1.  OU with minor early ARMD.  Option to use oral vitamin therapy  2.  Nevus OS, minor , no interval change  3.  OS with minor epiretinal membrane does not explain her visual acuity   4.  Dry eyes likely consideration for impact on acuity  Ophthalmic Meds Ordered this visit:  No orders of the defined types were placed in this encounter.      Return in about 1 year (around 01/07/2023) for DILATE OU, OCT, COLOR FP.  There are no Patient Instructions on file for this visit.   Explained the diagnoses, plan, and follow up with the patient and they expressed understanding.  Patient expressed understanding of the importance of proper follow up care.   Clent Demark Meganne Rita M.D. Diseases & Surgery of the Retina and Vitreous [facility] [place] 01/07/22     Abbreviations: M myopia (nearsighted); A astigmatism; H hyperopia (farsighted); P presbyopia; Mrx spectacle prescription;  CTL contact lenses; OD right eye; OS left eye; OU both  eyes  XT exotropia; ET esotropia; PEK punctate epithelial keratitis; PEE punctate epithelial erosions; DES dry eye syndrome; MGD meibomian gland dysfunction; ATs artificial tears; PFAT's preservative free artificial tears; [place] nuclear sclerotic cataract; PSC posterior subcapsular cataract; ERM epi-retinal membrane; PVD posterior vitreous detachment; RD retinal detachment; DM diabetes mellitus; DR diabetic retinopathy; NPDR non-proliferative diabetic retinopathy; PDR proliferative diabetic retinopathy; CSME clinically significant macular edema; DME diabetic macular edema; dbh dot blot hemorrhages; CWS cotton wool spot; POAG primary open angle glaucoma; C/D cup-to-disc ratio; HVF humphrey visual field; GVF goldmann visual field; OCT optical coherence tomography; IOP intraocular pressure; BRVO Branch retinal vein  occlusion; CRVO central retinal vein occlusion; CRAO central retinal artery occlusion; BRAO branch retinal artery occlusion; RT retinal tear; SB scleral buckle; PPV pars plana vitrectomy; VH Vitreous hemorrhage; PRP panretinal laser photocoagulation; IVK intravitreal kenalog; VMT vitreomacular traction; MH Macular hole;  NVD neovascularization of the disc; NVE neovascularization elsewhere; AREDS age related eye disease study; ARMD age related macular degeneration; POAG primary open angle glaucoma; EBMD epithelial/anterior basement membrane dystrophy; ACIOL anterior chamber intraocular lens; IOL intraocular lens; PCIOL posterior chamber intraocular lens; Phaco/IOL phacoemulsification with intraocular lens placement; [place] photorefractive keratectomy; LASIK laser assisted in situ keratomileusis; HTN hypertension; DM diabetes mellitus; COPD chronic obstructive pulmonary disease

## 2022-01-07 NOTE — Assessment & Plan Note (Signed)
Minor OS with no foveal distortion and particularly no outer retinal distortion thus not impactful in acuity

## 2022-01-11 ENCOUNTER — Ambulatory Visit: Payer: Medicare HMO | Admitting: Physical Therapy

## 2022-01-18 DIAGNOSIS — H52203 Unspecified astigmatism, bilateral: Secondary | ICD-10-CM | POA: Diagnosis not present

## 2022-01-18 DIAGNOSIS — D3132 Benign neoplasm of left choroid: Secondary | ICD-10-CM | POA: Diagnosis not present

## 2022-01-18 DIAGNOSIS — H35372 Puckering of macula, left eye: Secondary | ICD-10-CM | POA: Diagnosis not present

## 2022-01-18 DIAGNOSIS — Z961 Presence of intraocular lens: Secondary | ICD-10-CM | POA: Diagnosis not present

## 2022-01-19 ENCOUNTER — Encounter: Payer: Self-pay | Admitting: Gastroenterology

## 2022-01-20 DIAGNOSIS — I872 Venous insufficiency (chronic) (peripheral): Secondary | ICD-10-CM | POA: Diagnosis not present

## 2022-01-20 DIAGNOSIS — M2669 Other specified disorders of temporomandibular joint: Secondary | ICD-10-CM | POA: Diagnosis not present

## 2022-01-20 DIAGNOSIS — I75023 Atheroembolism of bilateral lower extremities: Secondary | ICD-10-CM | POA: Diagnosis not present

## 2022-02-02 ENCOUNTER — Other Ambulatory Visit: Payer: Self-pay | Admitting: Gastroenterology

## 2022-02-15 ENCOUNTER — Telehealth: Payer: Self-pay | Admitting: Adult Health

## 2022-02-15 ENCOUNTER — Ambulatory Visit (INDEPENDENT_AMBULATORY_CARE_PROVIDER_SITE_OTHER): Payer: Medicare HMO

## 2022-02-15 DIAGNOSIS — R0602 Shortness of breath: Secondary | ICD-10-CM | POA: Diagnosis not present

## 2022-02-15 DIAGNOSIS — J439 Emphysema, unspecified: Secondary | ICD-10-CM | POA: Diagnosis not present

## 2022-02-15 NOTE — Telephone Encounter (Signed)
Okay to order chest x-ray 

## 2022-02-15 NOTE — Telephone Encounter (Signed)
I called the patient and per Dr. Halford Chessman he is ok with her getting a CXR prior to being seen in the office and orders have been placed. Nothing further needed.  ?

## 2022-02-15 NOTE — Telephone Encounter (Signed)
I called the patient and she repots that she has had shortness of breath and yellow sputum x 1-2 weeks. She denies any other symptoms She has not taken anything for the cough. She is getting over Covid as well. She has finished Paxlovid.  She is wanting  to get a chest xray before she gets any medication per her request. Please advise if she is ok to come an get a CXR today. Last OV was 10/22/21. She is ok with getting a CXR now and making a follow up at a later time. Please advise.  ?

## 2022-02-18 ENCOUNTER — Telehealth: Payer: Self-pay | Admitting: Pulmonary Disease

## 2022-02-19 NOTE — Telephone Encounter (Signed)
Called patient and advised patient of her chest xray results. Patient verbalized understanding with no issues. I advised patient that if her cough becomes productive and worse to call for an office visit and she voiced understanding. Nothing further needed  ?

## 2022-03-17 ENCOUNTER — Encounter: Payer: Self-pay | Admitting: Pulmonary Disease

## 2022-03-17 ENCOUNTER — Ambulatory Visit (INDEPENDENT_AMBULATORY_CARE_PROVIDER_SITE_OTHER): Payer: Medicare HMO | Admitting: Pulmonary Disease

## 2022-03-17 VITALS — BP 102/80 | HR 61 | Temp 97.7°F | Ht 64.0 in | Wt 81.6 lb

## 2022-03-17 DIAGNOSIS — J479 Bronchiectasis, uncomplicated: Secondary | ICD-10-CM | POA: Diagnosis not present

## 2022-03-17 DIAGNOSIS — J432 Centrilobular emphysema: Secondary | ICD-10-CM | POA: Diagnosis not present

## 2022-03-17 DIAGNOSIS — E43 Unspecified severe protein-calorie malnutrition: Secondary | ICD-10-CM

## 2022-03-17 MED ORDER — SPIRIVA RESPIMAT 1.25 MCG/ACT IN AERS: 1.0000 | INHALATION_SPRAY | Freq: Every day | RESPIRATORY_TRACT | 5 refills | Status: DC

## 2022-03-17 NOTE — Patient Instructions (Signed)
Spiriva respimat 1 puff daily ? ?Follow up in 4 months ?

## 2022-03-17 NOTE — Progress Notes (Signed)
? ?[facility] ? ?Chief Complaint  ?Patient presents with  ? Follow-up  ?  Follow up.   ? ? ?Constitutional:  ?BP 102/80 (BP Location: Right Arm, Patient Position: Sitting, Cuff Size: Normal)   Pulse 61   Temp 97.7 ?F (36.5 ?C) (Oral)   Ht '5\' 4"'$  (1.626 m)   Wt 81 lb 9.6 oz (37 kg)   SpO2 97%   BMI 14.01 kg/m?  ? ?Past Medical History:  ?OA, Esophageal dysmotility, Nutcracker esophagus, Gastroparesis, GERD, Iron deficiency anemia, Neuropathy, Raynaud's ? ?Past Surgical History:  ?She  has a past surgical history that includes Cholecystectomy (2005); Breast lumpectomy; Total abdominal hysterectomy; Lung biopsy; Cataract extraction; Colonoscopy; Upper gastrointestinal endoscopy; Esophageal manometry (N/A, 10/06/2015); 24 hour ph study (N/A, 10/13/2015); Esophageal manometry (N/A, 03/10/2018); and Esophageal manometry (N/A, 10/15/2020). ? ?Brief Summary:  ?[name] is a 81 y.o. female former smoker with bronchiectasis, recurrent aspiration pneumonia with history of reflux, emphysema and history of cryptogenic organizing pneumonia. ?  ? ? ? ?Subjective:  ? ?She had COVID in late March/early April.  Chest xray from 02/16/22 showed bronchial thickening and hyperinflation. ? ?She doesn't feel like her breathing is back to normal.  She doesn't have much appetite and continues to lose weight.  Her throat burns and this makes it hard for her to eat. ? ?She has cough with yellow sputum.  Not having wheeze, fever, or hemoptysis. ? ? ?Physical Exam:  ? ?Appearance - well kempt, thin ? ?ENMT - no sinus tenderness, no oral exudate, no LAN, Mallampati 2 airway, no stridor ? ?Respiratory - decreased breath sounds bilaterally, no wheezing or rales ? ?CV - s1s2 regular rate and rhythm, no murmurs ? ?Ext - no clubbing, no edema ? ?Skin - no rashes ? ?Psych - normal mood and affect  ? ? ?  ?Pulmonary testing:  ?VATS lung biopsy 2003 >> BOOP ?PFT 12/31/09 >> FEV1 1.84(84%), FEV1% 73, TLC  4.94(98%), DLCO 77%, +BD. ?PFT 06/21/16 >> FEV1 1.22 (55%), FEV1% 76, TLC 5.29 (104%), DLCO 43% ?PFT 12/21/16 >> FEV1 1.49 (69%), FEV1% 78, TLC 5.42 (107%), RV 158%, DLCO 51% ? ?Chest Imaging:  ?CT chest 08/17/10 >> mild biapical scarring, basilar peribronchovascular nodularity and mild bronchiectasis ?HRCT chest 11/28/17 >> mild centrilobular emphysema, peribronchovascular nodularity, mild BTX, LLL consolidation ?CT chest 03/02/18 >> mild emphysema, areas of BTX, several nodular areas up to 1.4 cm ?CT chest 08/25/18 >> centrilobular emphysema, BTX with wall thickening and tree in bud RML/lingula/bilateral lower lobes, unchanged opacity LLL, new patchy opacities LUL  ?HRCT chest 09/21/19 >> apical scarring, diffuse BTX with peribronchovascular nodularity, areas of mucoid impaction, coronary calcification ? ?Cardiac Tests:  ?Echo 10/08/19 >> EF 60 to 65%, mild LVH, grade 2 DD, mild MR, mild TR ? ?Social History:  ?She  reports that she quit smoking about 53 years ago. Her smoking use included cigarettes. She has a 2.50 pack-year smoking history. She has never used smokeless tobacco. She reports that she does not drink alcohol and does not use drugs. ? ?Family History:  ?Her family history includes Cancer in her maternal grandmother; Heart disease in her father and paternal grandfather; Leukemia in her paternal grandmother; Lung cancer in her paternal uncle; Lymphoma in her mother. ?  ? ? ?Assessment/Plan:  ? ?Bronchiectasis, centrilobular emphysema. ?- she is unable to tolerate albuterol, hypertonic saline, or flutte valve ?- will have her try spiriva respimat at lower dose and 1 puff per day ?- mucinex, tessalon  prn ? ?  ?Peripheral neuropathy. ?- followed by Dr. Delice Lesch with [facility] ?  ?Raynaud's phenomenon. ?- followed by Rheumatology at [facility] ?  ?GERD with dysphagia. ?- she will schedule follow up with Dr. Havery Moros with [facility] ?  ?Cachexia. ?- discussed options to keep up with caloric and  protein intake ? ?Time Spent Involved in Patient Care on Day of Examination:  ?37 minutes ? ?Follow up:  ? ?Patient Instructions  ?Spiriva respimat 1 puff daily ? ?Follow up in 4 months ? ?Medication List:  ? ?Allergies as of 03/17/2022   ? ?   Reactions  ? Prednisone   ? Cortisone Other (See Comments)  ? unsure  ? Other Other (See Comments)  ? Sulfasalazine Other (See Comments)  ? Unsure of reaction  ? Sulfonamide Derivatives Other (See Comments)  ? Unsure of reaction  ? ?  ? ?  ?Medication List  ?  ? ?  ? Accurate as of Mar 17, 2022  2:24 PM. If you have any questions, ask your nurse or doctor.  ?  ?  ? ?  ? ?benzonatate 200 MG capsule ?Commonly known as: TESSALON ?TAKE 1 CAPSULE AS NEEDED FOR COUGH. ?  ?calcium-vitamin D 500-200 MG-UNIT tablet ?Commonly known as: OSCAL WITH D ?Take 1 tablet by mouth. ?  ?famotidine 20 MG tablet ?Commonly known as: Pepcid ?Take 1 tablet (20 mg total) by mouth 2 (two) times daily. ?  ?folic acid 1 MG tablet ?Commonly known as: FOLVITE ?Take 1 tablet by mouth daily. ?  ?gabapentin 250 MG/5ML solution ?Commonly known as: NEURONTIN ?TAKE 2 ML AT BEDTIME. Pt takes as needed ?  ?omeprazole 40 MG capsule ?Commonly known as: PRILOSEC ?Take 1 capsule (40 mg total) by mouth 2 (two) times daily. ?  ?Paxlovid (300/100) 20 x 150 MG & 10 x '100MG'$  Tbpk ?Generic drug: nirmatrelvir & ritonavir ?Take by mouth. ?  ?pentoxifylline 400 MG CR tablet ?Commonly known as: TRENTAL ?Take 400 mg by mouth 3 (three) times daily. ?  ?pseudoephedrine-guaifenesin 60-600 MG 12 hr tablet ?Commonly known as: MUCINEX D ?Take 1 tablet by mouth daily. ?  ?Spiriva Respimat 1.25 MCG/ACT Aers ?Generic drug: Tiotropium Bromide Monohydrate ?Inhale 1 puff into the lungs daily. ?Started by: Chesley Mires, MD ?  ?sucralfate 1 g tablet ?Commonly known as: CARAFATE ?TAKE ONE TABLET EVERY 6 HOURS AS NEEDED. (MAY DISOLVE 1 TABLET IN 1 TABLESPOON OF DISTILLED WATER.) ?  ?acetaminophen 500 MG tablet ?Commonly known as: TYLENOL ?Take 1  tablet by mouth daily. ?  ?TYLENOL EXTRA STRENGTH PO ?Take 350 mg by mouth as needed. ?  ? ?  ? ? ?Signature:  ?Chesley Mires, MD ?[place] ?Pager - [phone] - [phone] ?03/17/2022, 2:24 PM ?  ? ? ? ? ? ? ? ? ?

## 2022-03-29 ENCOUNTER — Other Ambulatory Visit: Payer: Self-pay | Admitting: Pulmonary Disease

## 2022-03-29 ENCOUNTER — Encounter: Payer: Self-pay | Admitting: Podiatry

## 2022-03-29 ENCOUNTER — Ambulatory Visit (INDEPENDENT_AMBULATORY_CARE_PROVIDER_SITE_OTHER): Payer: Medicare HMO | Admitting: Podiatry

## 2022-03-29 DIAGNOSIS — M79675 Pain in left toe(s): Secondary | ICD-10-CM | POA: Diagnosis not present

## 2022-03-29 DIAGNOSIS — G629 Polyneuropathy, unspecified: Secondary | ICD-10-CM

## 2022-03-29 DIAGNOSIS — L84 Corns and callosities: Secondary | ICD-10-CM

## 2022-03-29 DIAGNOSIS — M79674 Pain in right toe(s): Secondary | ICD-10-CM | POA: Diagnosis not present

## 2022-03-29 DIAGNOSIS — B351 Tinea unguium: Secondary | ICD-10-CM | POA: Diagnosis not present

## 2022-04-04 NOTE — Progress Notes (Signed)
  Subjective:  Patient ID: [name], female    DOB: 03-10-1941,  MRN: [id number]  CARMESHA MOROCCO presents to clinic today for at risk foot care with history of peripheral neuropathy and corn(s) L 3rd toe, callus(es) right lower extremity and painful mycotic nails.  Pain interferes with ambulation. Aggravating factors include wearing enclosed shoe gear. Painful toenails interfere with ambulation. Aggravating factors include wearing enclosed shoe gear. Pain is relieved with periodic professional debridement. Painful corns and calluses are aggravated when weightbearing with and without shoegear. Pain is relieved with periodic professional debridement.  New problem(s): None.   PCP is [name], MD , and last visit was November 19, 2021.  Allergies  Allergen Reactions   Prednisone    Cortisone Other (See Comments)    unsure   Other Other (See Comments)   Sulfasalazine Other (See Comments)    Unsure of reaction   Sulfonamide Derivatives Other (See Comments)    Unsure of reaction    Review of Systems: Negative except as noted in the HPI.  Objective: No changes noted in today's physical examination. General: Patient is a pleasant 81 y.o. Caucasian female WD, WN in NAD. AAO x 3.   Neurovascular Examination: Neurovascular status intact bilateral lower extremities. Capillary refill time to digits immediate b/l. Palpable pedal pulses b/l LE. Pedal hair sparse. No pain with calf compression b/l. Lower extremity skin temperature gradient within normal limits. No edema noted b/l LE. No cyanosis or clubbing noted b/l LE.  Protective sensation decreased with 10 gram monofilament b/l.  Dermatological:  Pedal skin warm and supple b/l.  No open wounds b/l. No interdigital macerations. Toenails 1-5 b/l elongated, thickened, discolored with subungual debris. +Tenderness with dorsal palpation of nailplates. Hyperkeratotic lesion(s) L 3rd toe and 1st metatarsal head right lower extremity with tenderness  to palpation. No edema, no erythema, no drainage, no fluctuance.   Musculoskeletal:  Normal muscle strength 5/5 to all lower extremity muscle groups bilaterally. Hammertoe deformity noted 2-5 b/l.[name] No pain, crepitus or joint limitation noted with ROM b/l LE.  Patient ambulates independently without assistive aids.  Assessment/Plan: 1. Pain due to onychomycosis of toenails of both feet   2. Corns and callosities   3. Neuropathy      -Patient was evaluated and treated. All patient's and/or POA's questions/concerns answered on today's visit. -Toenails 1-5 b/l were debrided in length and girth with sterile nail nippers and dremel without iatrogenic bleeding.  -Corn(s) L 3rd toe pared utilizing sterile scalpel blade without complication or incident. Total number debrided=1. -Callus(es) 1st metatarsal head right lower extremity pared utilizing sterile scalpel blade without complication or incident. Total number debrided =1. -Patient/POA to call should there be question/concern in the interim.   Return in about 3 months (around 06/29/2022).  Marzetta Board, DPM

## 2022-05-05 ENCOUNTER — Other Ambulatory Visit: Payer: Self-pay | Admitting: Gastroenterology

## 2022-05-25 ENCOUNTER — Other Ambulatory Visit: Payer: Self-pay | Admitting: Gastroenterology

## 2022-06-02 ENCOUNTER — Other Ambulatory Visit: Payer: Self-pay | Admitting: Internal Medicine

## 2022-06-02 ENCOUNTER — Ambulatory Visit
Admission: RE | Admit: 2022-06-02 | Discharge: 2022-06-02 | Disposition: A | Discharge status: Home / Self Care | Payer: Medicare HMO | Source: Ambulatory Visit | Attending: Internal Medicine | Admitting: Internal Medicine

## 2022-06-02 DIAGNOSIS — M533 Sacrococcygeal disorders, not elsewhere classified: Secondary | ICD-10-CM | POA: Diagnosis not present

## 2022-06-02 DIAGNOSIS — W19XXXA Unspecified fall, initial encounter: Secondary | ICD-10-CM

## 2022-06-02 DIAGNOSIS — M545 Low back pain, unspecified: Secondary | ICD-10-CM | POA: Diagnosis not present

## 2022-06-02 DIAGNOSIS — R102 Pelvic and perineal pain: Secondary | ICD-10-CM | POA: Diagnosis not present

## 2022-06-02 DIAGNOSIS — R030 Elevated blood-pressure reading, without diagnosis of hypertension: Secondary | ICD-10-CM | POA: Diagnosis not present

## 2022-06-02 DIAGNOSIS — S41112A Laceration without foreign body of left upper arm, initial encounter: Secondary | ICD-10-CM | POA: Diagnosis not present

## 2022-06-11 DIAGNOSIS — R519 Headache, unspecified: Secondary | ICD-10-CM | POA: Diagnosis not present

## 2022-06-11 DIAGNOSIS — Z79899 Other long term (current) drug therapy: Secondary | ICD-10-CM | POA: Diagnosis not present

## 2022-06-11 DIAGNOSIS — R42 Dizziness and giddiness: Secondary | ICD-10-CM | POA: Diagnosis not present

## 2022-06-14 ENCOUNTER — Other Ambulatory Visit: Payer: Self-pay | Admitting: Geriatric Medicine

## 2022-06-14 DIAGNOSIS — R42 Dizziness and giddiness: Secondary | ICD-10-CM

## 2022-06-14 DIAGNOSIS — R519 Headache, unspecified: Secondary | ICD-10-CM

## 2022-06-16 IMAGING — DX DG CHEST 2V
2 series · 2 of 2 positions shown · non-contrast
Comparison: Chest x-ray 01/09/2021.

CLINICAL DATA: History of 79-year-old female with bronchiectasis.

EXAM:
CHEST - 2 VIEW

[chest pa]
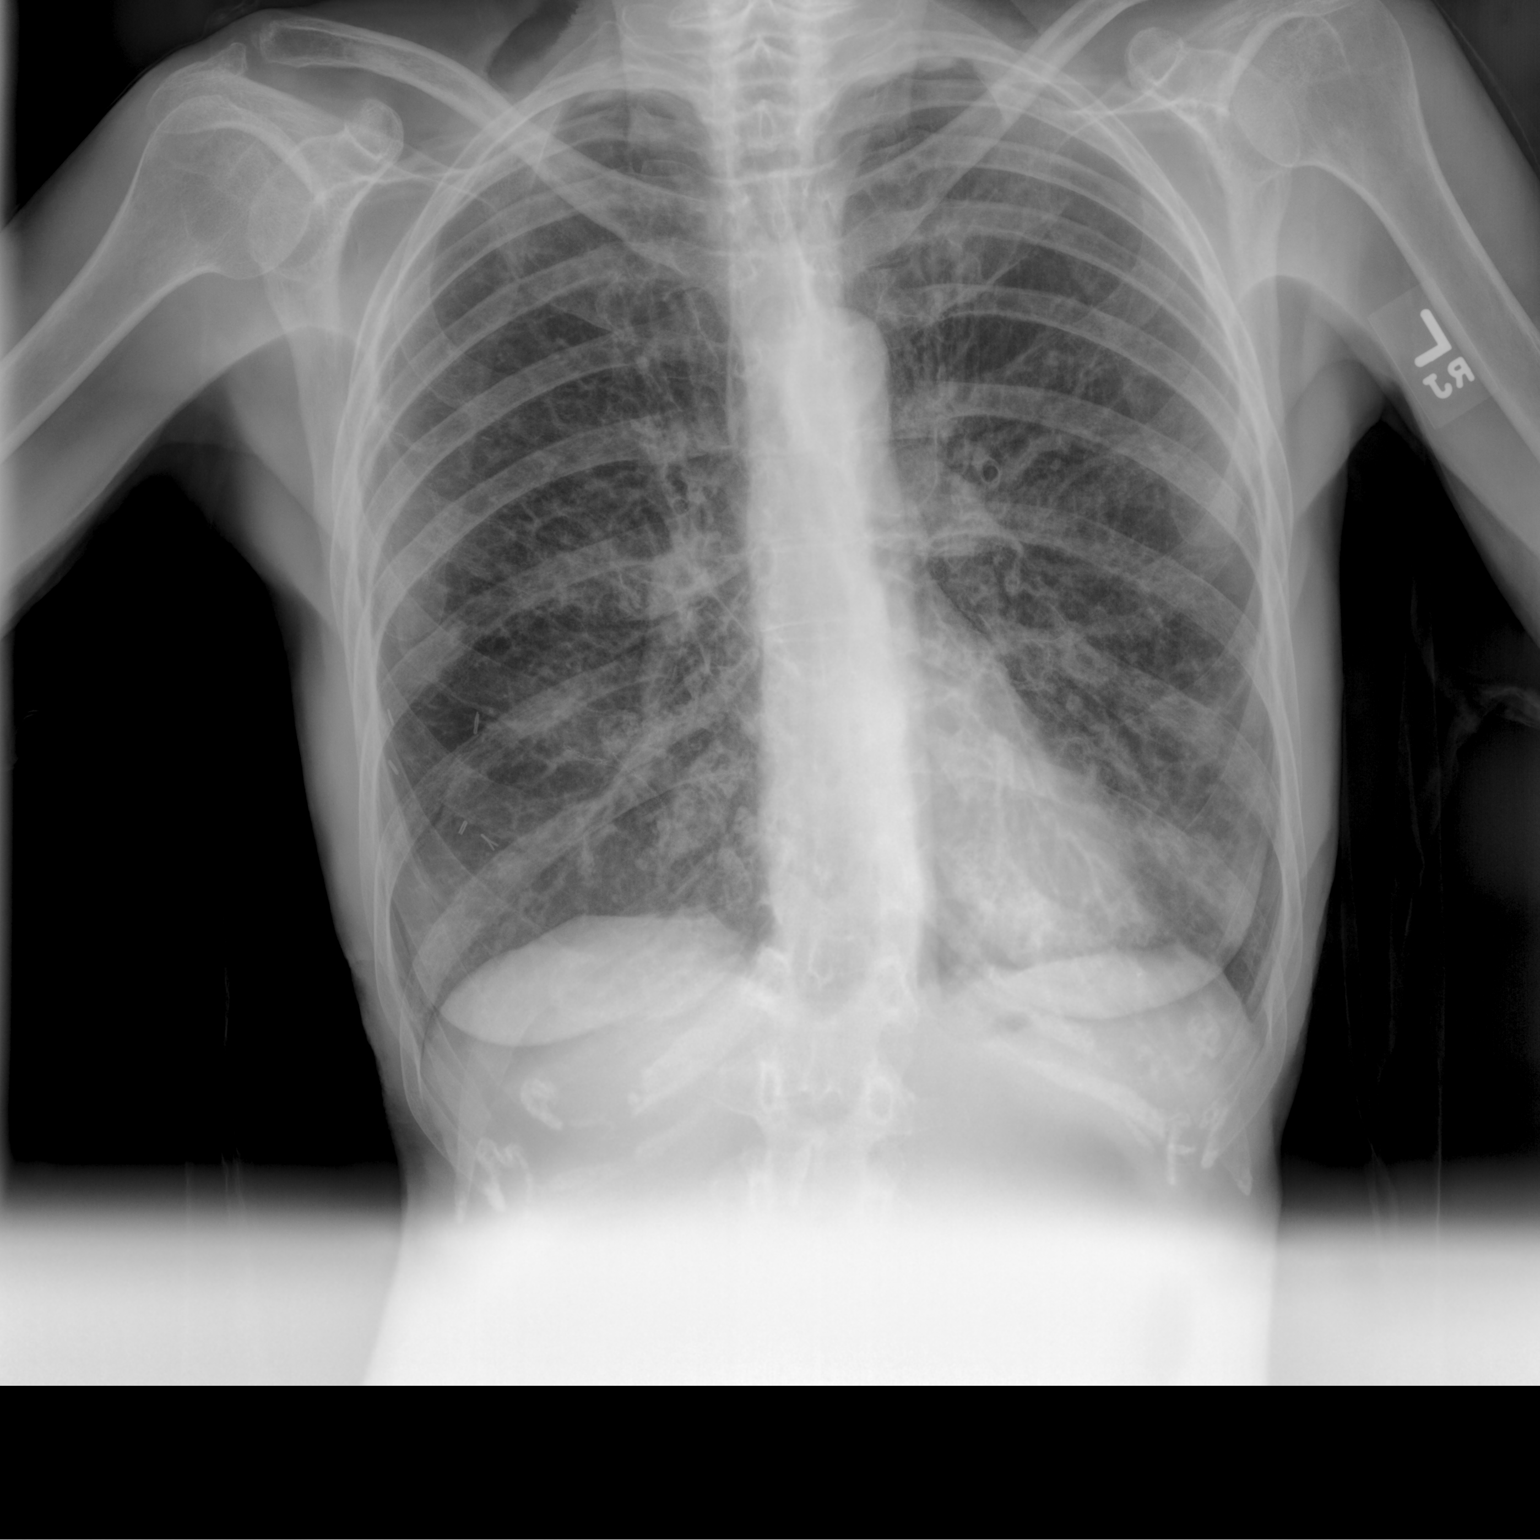

[chest lat]
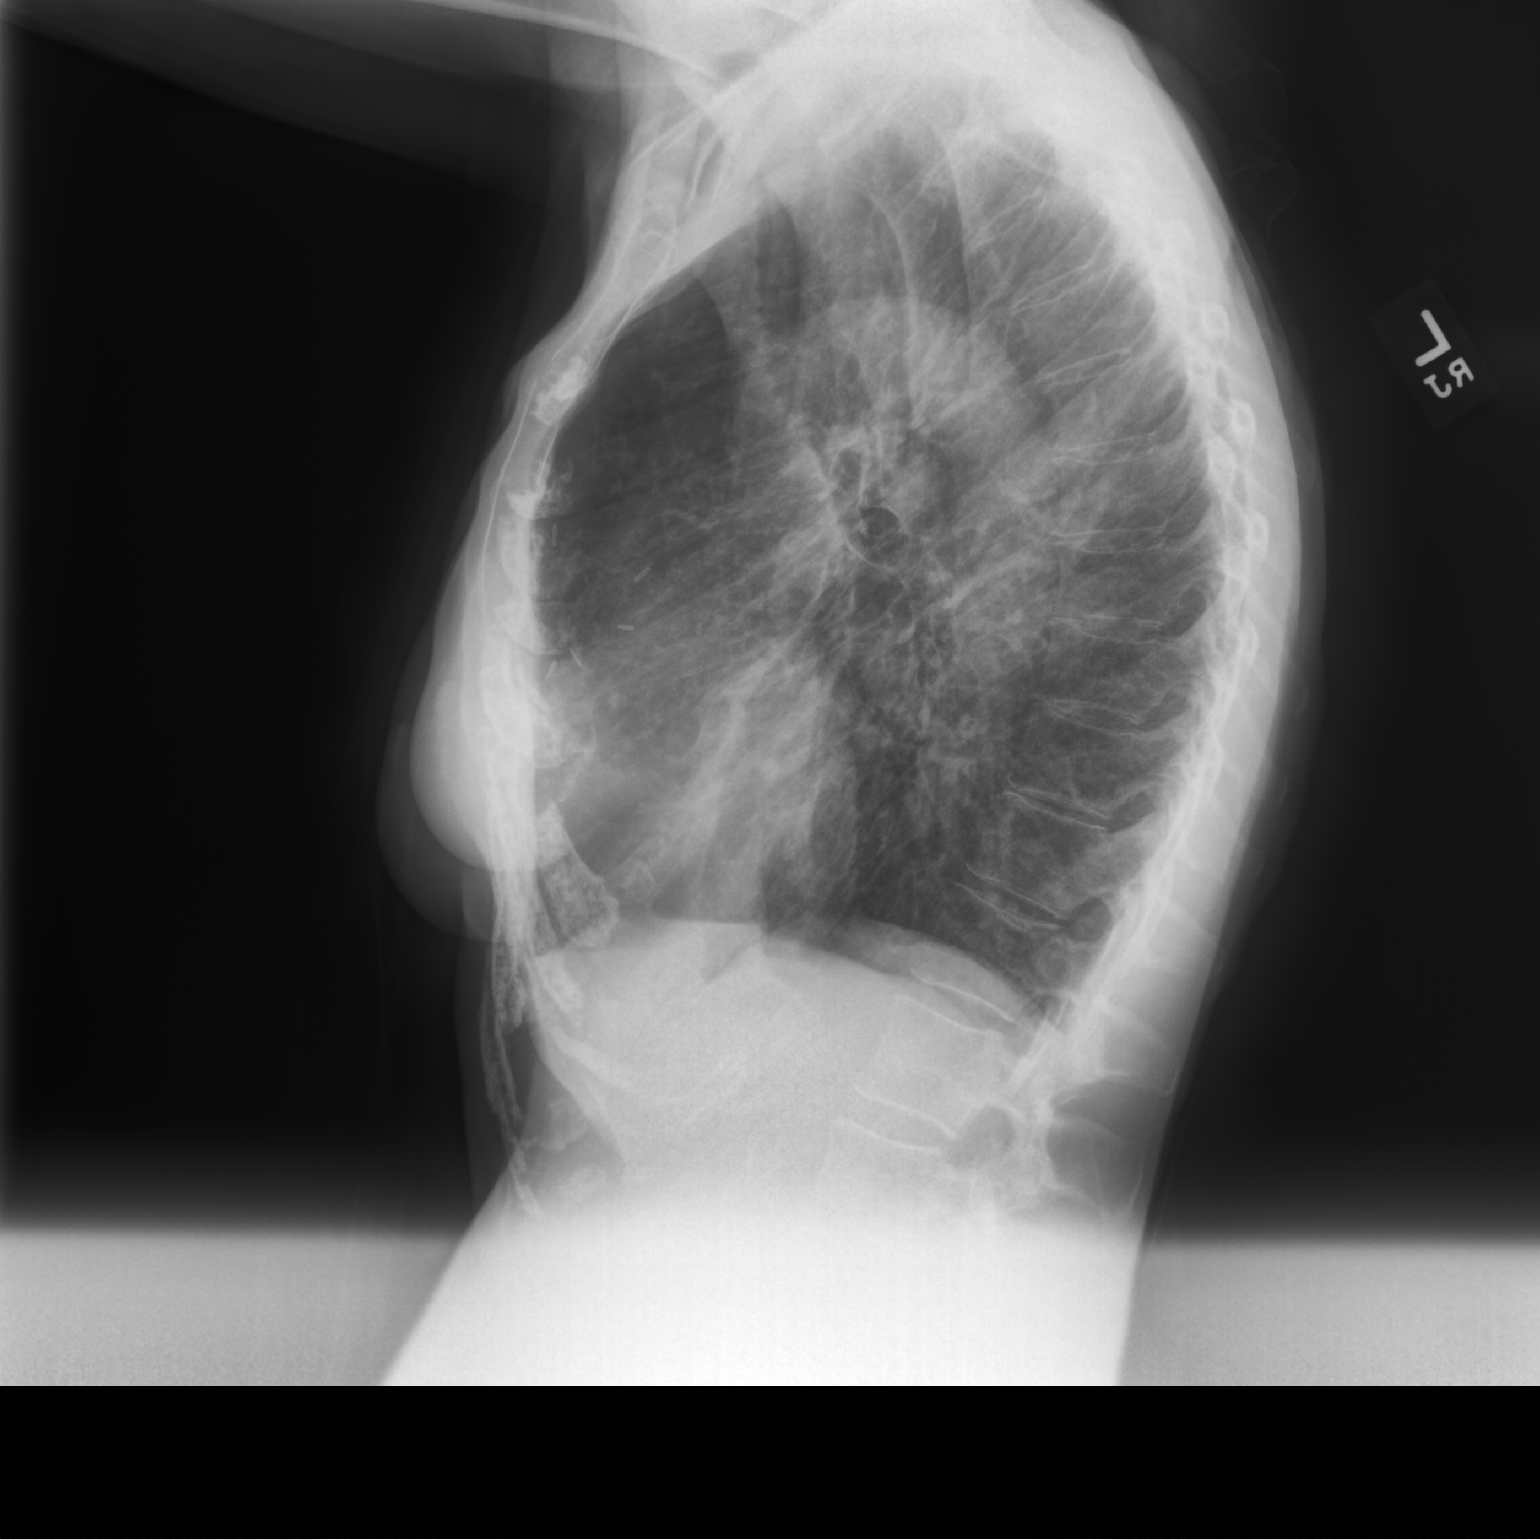

[2 of 2 positions shown; findings below may reference images not displayed]

FINDINGS: Lungs are hyperexpanded with emphysematous changes. Diffuse
peribronchial cuffing. No consolidative airspace disease. No pleural
effusions. No pneumothorax. No pulmonary nodule or mass noted.
Pulmonary vasculature and the cardiomediastinal silhouette are
within normal limits. Atherosclerosis in the thoracic aorta.
IMPRESSION: 1. Diffuse peribronchial cuffing, concerning for an acute
bronchitis.
2. Emphysema.
3. Aortic atherosclerosis.

## 2022-06-21 ENCOUNTER — Telehealth: Payer: Self-pay | Admitting: Gastroenterology

## 2022-06-21 NOTE — Telephone Encounter (Signed)
Patient called states she has been having a lot of burning sensation said she has a hernia seeking advise.

## 2022-06-21 NOTE — Telephone Encounter (Signed)
Returned call to patient. She states that she is taking all of the recommended medications and she is still experiencing GERD symptoms despite that. Pt states that she is also having dysphagia. Pt has been scheduled for the next available appt with Dr. Havery Moros on Friday, 07/16/22 at 3:40 pm. Pt is aware that her medications may need to be adjusted, but only after reassessment. Pt verbalized understanding and had no concerns at the end of the call.

## 2022-06-26 ENCOUNTER — Ambulatory Visit
Admission: RE | Admit: 2022-06-26 | Discharge: 2022-06-26 | Disposition: A | Discharge status: Home / Self Care | Payer: Medicare HMO | Source: Ambulatory Visit | Attending: Geriatric Medicine | Admitting: Geriatric Medicine

## 2022-06-26 DIAGNOSIS — R42 Dizziness and giddiness: Secondary | ICD-10-CM | POA: Diagnosis not present

## 2022-06-26 DIAGNOSIS — R519 Headache, unspecified: Secondary | ICD-10-CM | POA: Diagnosis not present

## 2022-06-26 MED ORDER — GADOBENATE DIMEGLUMINE 529 MG/ML IV SOLN
7.0000 mL | Freq: Once | INTRAVENOUS | Status: AC | PRN
  Administered 2022-06-26: 7 mL via INTRAVENOUS

## 2022-06-28 ENCOUNTER — Other Ambulatory Visit: Payer: Self-pay | Admitting: Internal Medicine

## 2022-06-28 ENCOUNTER — Ambulatory Visit
Admission: RE | Admit: 2022-06-28 | Discharge: 2022-06-28 | Disposition: A | Discharge status: Home / Self Care | Payer: Medicare HMO | Source: Ambulatory Visit | Attending: Internal Medicine | Admitting: Internal Medicine

## 2022-06-28 DIAGNOSIS — S0992XA Unspecified injury of nose, initial encounter: Secondary | ICD-10-CM | POA: Diagnosis not present

## 2022-06-28 DIAGNOSIS — G629 Polyneuropathy, unspecified: Secondary | ICD-10-CM | POA: Diagnosis not present

## 2022-06-28 DIAGNOSIS — Z23 Encounter for immunization: Secondary | ICD-10-CM | POA: Diagnosis not present

## 2022-06-28 DIAGNOSIS — E222 Syndrome of inappropriate secretion of antidiuretic hormone: Secondary | ICD-10-CM | POA: Diagnosis not present

## 2022-06-28 DIAGNOSIS — S80219A Abrasion, unspecified knee, initial encounter: Secondary | ICD-10-CM | POA: Diagnosis not present

## 2022-06-28 DIAGNOSIS — W19XXXA Unspecified fall, initial encounter: Secondary | ICD-10-CM | POA: Diagnosis not present

## 2022-06-30 ENCOUNTER — Encounter: Payer: Self-pay | Admitting: Podiatry

## 2022-06-30 ENCOUNTER — Ambulatory Visit (INDEPENDENT_AMBULATORY_CARE_PROVIDER_SITE_OTHER): Payer: Medicare HMO | Admitting: Podiatry

## 2022-06-30 DIAGNOSIS — M79675 Pain in left toe(s): Secondary | ICD-10-CM

## 2022-06-30 DIAGNOSIS — G629 Polyneuropathy, unspecified: Secondary | ICD-10-CM | POA: Diagnosis not present

## 2022-06-30 DIAGNOSIS — Q828 Other specified congenital malformations of skin: Secondary | ICD-10-CM

## 2022-06-30 DIAGNOSIS — M79674 Pain in right toe(s): Secondary | ICD-10-CM

## 2022-06-30 DIAGNOSIS — B351 Tinea unguium: Secondary | ICD-10-CM | POA: Diagnosis not present

## 2022-07-06 NOTE — Progress Notes (Signed)
  Subjective:  Patient ID: [name], female    DOB: August 05, 1941,  MRN: [id number]  [name] presents to clinic today for at risk foot care with history of peripheral neuropathy and corn(s) left lower extremity, callus(es) right lower extremity and painful mycotic nails.  Pain interferes with ambulation. Aggravating factors include wearing enclosed shoe gear. Painful toenails interfere with ambulation. Aggravating factors include wearing enclosed shoe gear. Pain is relieved with periodic professional debridement. Painful corns and calluses are aggravated when weightbearing with and without shoegear. Pain is relieved with periodic professional debridement.  New problem(s): None.   PCP is [name], MD , and last visit was  June 28, 2022  Allergies  Allergen Reactions   Prednisone    Sulfa Antibiotics     Other reaction(s): Unknown   Cortisone Other (See Comments)    unsure   Other Other (See Comments)   Sulfasalazine Other (See Comments)    Unsure of reaction   Sulfonamide Derivatives Other (See Comments)    Unsure of reaction    Review of Systems: Negative except as noted in the HPI.  Objective: General: Patient is a pleasant 81 y.o. Caucasian female WD, WN in NAD. AAO x 3.   Neurovascular Examination: Neurovascular status intact bilateral lower extremities. Capillary refill time to digits immediate b/l. Palpable pedal pulses b/l LE. Pedal hair sparse. No pain with calf compression b/l. Lower extremity skin temperature gradient within normal limits. No edema noted b/l LE. No cyanosis or clubbing noted b/l LE.  Protective sensation decreased with 10 gram monofilament b/l.  Dermatological:  Pedal skin warm and supple b/l.  No open wounds b/l. No interdigital macerations. Toenails 1-5 b/l elongated, thickened, discolored with subungual debris. +Tenderness with dorsal palpation of nailplates. Porokeratotic lesion(s) distal tip L 3rd toe and subungual left great toe with  tenderness to palpation. No edema, no erythema, no drainage, no fluctuance.   Musculoskeletal:  Normal muscle strength 5/5 to all lower extremity muscle groups bilaterally. Hammertoe deformity noted 2-5 b/l.[name] No pain, crepitus or joint limitation noted with ROM b/l LE.  Patient ambulates independently without assistive aids.  Assessment/Plan: 1. Pain due to onychomycosis of toenails of both feet   2. Porokeratosis   3. Neuropathy   -Examined patient. -Patient to continue soft, supportive shoe gear daily. -Mycotic toenails 1-5 bilaterally were debrided in length and girth with sterile nail nippers and dremel without incident. -Porokeratotic lesion(s) subungually left great toe and distal tip of left 3rd toe pared and enucleated with sterile scalpel blade without incident. Total number of lesions debrided=2. -Patient/POA to call should there be question/concern in the interim.   Return in about 10 weeks (around 09/08/2022).  Marzetta Board, DPM

## 2022-07-12 ENCOUNTER — Encounter: Payer: Self-pay | Admitting: *Deleted

## 2022-07-12 ENCOUNTER — Telehealth: Payer: Self-pay | Admitting: Neurology

## 2022-07-12 NOTE — Telephone Encounter (Signed)
Patient called to report recent falls: one fall at [facility] where she fell in the parking lot, and once in the house. She said she felt beat up afterwards but no major injuries from falls.   She did go see Dr. Havery Moros to follow up after falls.  She's also reporting she is just having a hard time walking. Patient added to wait list but she wants Dr. Delice Lesch to know she needs to see her sooner than 08/16/22. Patient added to wait list.

## 2022-07-13 DIAGNOSIS — S80219A Abrasion, unspecified knee, initial encounter: Secondary | ICD-10-CM | POA: Diagnosis not present

## 2022-07-13 DIAGNOSIS — S41112A Laceration without foreign body of left upper arm, initial encounter: Secondary | ICD-10-CM | POA: Diagnosis not present

## 2022-07-13 DIAGNOSIS — E440 Moderate protein-calorie malnutrition: Secondary | ICD-10-CM | POA: Diagnosis not present

## 2022-07-13 DIAGNOSIS — W19XXXA Unspecified fall, initial encounter: Secondary | ICD-10-CM | POA: Diagnosis not present

## 2022-07-16 ENCOUNTER — Ambulatory Visit (INDEPENDENT_AMBULATORY_CARE_PROVIDER_SITE_OTHER): Payer: Medicare HMO | Admitting: Gastroenterology

## 2022-07-16 ENCOUNTER — Encounter: Payer: Self-pay | Admitting: Gastroenterology

## 2022-07-16 ENCOUNTER — Other Ambulatory Visit (INDEPENDENT_AMBULATORY_CARE_PROVIDER_SITE_OTHER): Payer: Medicare HMO

## 2022-07-16 VITALS — BP 102/80 | HR 69 | Ht 64.0 in | Wt 77.4 lb

## 2022-07-16 DIAGNOSIS — K5900 Constipation, unspecified: Secondary | ICD-10-CM

## 2022-07-16 DIAGNOSIS — R1310 Dysphagia, unspecified: Secondary | ICD-10-CM | POA: Diagnosis not present

## 2022-07-16 DIAGNOSIS — K219 Gastro-esophageal reflux disease without esophagitis: Secondary | ICD-10-CM

## 2022-07-16 DIAGNOSIS — R1030 Lower abdominal pain, unspecified: Secondary | ICD-10-CM

## 2022-07-16 DIAGNOSIS — K649 Unspecified hemorrhoids: Secondary | ICD-10-CM | POA: Diagnosis not present

## 2022-07-16 DIAGNOSIS — Z79899 Other long term (current) drug therapy: Secondary | ICD-10-CM

## 2022-07-16 LAB — CBC WITH DIFFERENTIAL/PLATELET
Basophils Absolute: 0 10*3/uL (ref 0.0–0.1)
Basophils Relative: 0.6 % (ref 0.0–3.0)
Eosinophils Absolute: 0 10*3/uL (ref 0.0–0.7)
Eosinophils Relative: 0.5 % (ref 0.0–5.0)
HCT: 38.8 % (ref 36.0–46.0)
Hemoglobin: 13 g/dL (ref 12.0–15.0)
Lymphocytes Relative: 15.2 % (ref 12.0–46.0)
Lymphs Abs: 0.8 10*3/uL (ref 0.7–4.0)
MCHC: 33.6 g/dL (ref 30.0–36.0)
MCV: 92.5 fl (ref 78.0–100.0)
Monocytes Absolute: 0.5 10*3/uL (ref 0.1–1.0)
Monocytes Relative: 9.1 % (ref 3.0–12.0)
Neutro Abs: 4 10*3/uL (ref 1.4–7.7)
Neutrophils Relative %: 74.6 % (ref 43.0–77.0)
Platelets: 378 10*3/uL (ref 150.0–400.0)
RBC: 4.19 Mil/uL (ref 3.87–5.11)
RDW: 14.5 % (ref 11.5–15.5)
WBC: 5.3 10*3/uL (ref 4.0–10.5)

## 2022-07-16 LAB — COMPREHENSIVE METABOLIC PANEL
ALT: 54 U/L — ABNORMAL HIGH (ref 0–35)
AST: 37 U/L (ref 0–37)
Albumin: 4 g/dL (ref 3.5–5.2)
Alkaline Phosphatase: 102 U/L (ref 39–117)
BUN: 8 mg/dL (ref 6–23)
CO2: 33 mEq/L — ABNORMAL HIGH (ref 19–32)
Calcium: 9.2 mg/dL (ref 8.4–10.5)
Chloride: 90 mEq/L — ABNORMAL LOW (ref 96–112)
Creatinine, Ser: 0.4 mg/dL (ref 0.40–1.20)
GFR: 94.5 mL/min (ref >60.00)
Glucose, Bld: 78 mg/dL (ref 70–99)
Potassium: 3.7 mEq/L (ref 3.5–5.1)
Sodium: 128 mEq/L — ABNORMAL LOW (ref 135–145)
Total Bilirubin: 0.5 mg/dL (ref 0.2–1.2)
Total Protein: 6.9 g/dL (ref 6.0–8.3)

## 2022-07-16 NOTE — Progress Notes (Signed)
HPI :  81 year old female here for follow-up visit for reflux, history of chronic lung disease with bronchiectasis, osteopenia, dysphagia.   See prior notes for details of her case.  Recall she has had refractory GERD. Prior work-up has shown a DeMeester score of greater than 150 during pH study.  She has had an extensive evaluation for this in recent years.  She has regurgitation, history of aspiration, we think it is related to her bronchiectasis and chronic lung disease.  She has failed maximal medical therapy.  I previously referred her to Dr. Bryan Lemma for another opinion for possible TIF.  She has a Hill grade 4 view of the cardia, she also had esophageal manometry done showing mild EG J outflow obstruction.  Given these findings she was deemed to not be a candidate for TIF and was more appropriate for surgical repair of the hernia and fundoplication.  Given her lung disease, frailty, age, she has wanted to avoid surgical treatment for this.  At her previous visit she was taking Dexilant 60 mg a day and Carafate as needed.  I had since added Pepcid 20 mg twice daily.  Somewhere along the line we had tried her on baclofen at night for nocturnal symptoms, she states this made her quite sleepy and she could not tolerate it.  Unfortunately insurance stopped covering her Dexilant she was switched to omeprazole 40 mg twice daily.  Despite these measures she has a lot of heartburn and reflux nearly every day, can sometimes depend on what she eats.  She states despite this regimen she continues to have frequent symptoms that bother her.  Symptoms are definitely worse if she does not take her medications.  She is been having worsening dysphagia since have last seen her, that one of her main complaints.  She feels it in her throat, has a hard time getting food past her throat, and then goes down slowly after that.  She is on a mostly soft diet.  She eats quite slowly.  She has never had a prior dilation from  what I can see in her record as symptoms mostly reflux related in the past.  She had a barium swallow which showed some nonspecific dysmotility, also had cricopharyngeal dysfunction suspected, and marked reflux.  Recall she also has osteopenia and has a history of falls.  She adamantly does not want any surgery for her reflux as she does not think she will recover well from it.  She has bronchiectasis and is frail, she does not require any oxygen however. She states her breathing is at baseline.  She otherwise endorses some lower abdominal pains for the past "several months".  She states she has some mild discomfort there at baseline but can clearly be worsened at times.  She endorses some constipation more recently, she has some relief of her discomfort with a bowel movement.  She has had some irritated hemorrhoids from straining.  She has some nausea at times but no vomiting.  She inquires about options.  She states she does not want to take any liquids or powdered laxatives, she would prefer pills.  She has been using sitz bath's for her hemorrhoids, she inquires about other ways to treat these.  Her colonoscopy is up-to-date as below.  She had a CT scan 2 years ago which did not show any concerning pathology in her abdomen.     Prior work-up:  Colonoscopy 11/20/18 -  The perianal and digital rectal examinations were normal. - The terminal  ileum appeared normal. - A 3 mm polyp was found in the ileocecal valve. The polyp was sessile. The polyp was removed with a cold snare. Resection and retrieval were complete. - A 3 mm polyp was found in the rectum. The polyp was sessile. The polyp was removed with a cold snare. Resection and retrieval were complete. - The exam was otherwise without abnormality other than some tortousity. - Biopsies for histology were taken with a cold forceps from the right colon and left colon for evaluation of microscopic colitis given the patient's incidental complaints of  persistent loose Stools.    -EGD 08/2015 - 1cm HH, gastritis without ulceration, HP negative -pH study done in 2016 showing Demeester score of 152.7 but only symptom index of 23% for reflux. Most of reflux was weakly acidic. -Esophageal manometry showed what was thought to be nutcracker esophagus at the time; No swallows with DCI > 8000 - GES normal 2016 -Esophogram 10/10/2017  - small sliding type hiatal hernia, no distal stricture / mass, normal motility -Repeat manometry 03/10/2018 - hypercontractile esophagus, no other significant pathology; single swallow with DCI >8000 -EGD 11/20/2018- no hiatal hernia, Hill grade 2 valve, slightly irregular Z line but no BP, 3 to 4 mm duodenal adenoma removed by cold snare with otherwise normal-appearing stomach and duodenum. -EGD 07/2020-superficial ulcers in the mid esophagus (path benign), Hill grade 4, 1 cm axial by 4 cm transverse width hiatal hernia, mild gastritis -Esophageal Manometry 10/2020-normal peristalsis, normal resting pressure at EGJ with incomplete relaxation (IRP 17.6, normal <15) c/w some degree of EGJ outflow obstruction, but not achalasia   Barium study 10/06/21: IMPRESSION: 1. Tiny sliding hiatal hernia. Marked gastroesophageal reflux elicited. 2. 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 3. Evidence of mild reflux esophagitis. No evidence of esophageal mass or stricture.   Past Medical History:  Diagnosis Date   Allergy    Arthritis    Atherosclerosis of aorta ([place]) 2019   ct   BOOP (bronchiolitis obliterans with organizing pneumonia) ([place]) 2003   Bronchiectasis    Cataract    Esophageal dysmotility    Esophageal ulcer    Gastroparesis    GERD (gastroesophageal reflux disease)    severe   Hiatal hernia    Iron deficiency anemia    Macular degeneration (senile) of retina    Neuropathy    Nutcracker esophagus     Osteopenia    Pneumonia    Raynaud's disease    Recurrent pneumonia    Due to aspiration   Thrombocytosis      Past Surgical History:  Procedure Laterality Date   64 HOUR [place] STUDY N/A 10/13/2015   Procedure: 24 HOUR [place] STUDY;  Surgeon: Gatha Mayer, MD;  Location: [facility];  Service: Endoscopy;  Laterality: N/A;   BREAST LUMPECTOMY     left, x 2   CATARACT EXTRACTION     both eyes   CHOLECYSTECTOMY  2005   COLONOSCOPY     ESOPHAGEAL MANOMETRY N/A 10/06/2015   Procedure: ESOPHAGEAL MANOMETRY (EM);  Surgeon: Manus Gunning, MD;  Location: [facility];  Service: Gastroenterology;  Laterality: N/A;   ESOPHAGEAL MANOMETRY N/A 03/10/2018   Procedure: ESOPHAGEAL MANOMETRY (EM);  Surgeon: Mauri Pole, MD;  Location: [facility];  Service: Endoscopy;  Laterality: N/A;   ESOPHAGEAL MANOMETRY N/A 10/15/2020   Procedure: ESOPHAGEAL MANOMETRY (EM);  Surgeon: Lavena Bullion, DO;  Location: [facility];  Service: Gastroenterology;  Laterality: N/A;  LUNG BIOPSY     vats right 2003   TOTAL ABDOMINAL HYSTERECTOMY     UPPER GASTROINTESTINAL ENDOSCOPY     Family History  Problem Relation Age of Onset   Lymphoma Mother    Heart disease Father    Lung cancer Paternal Uncle    Heart disease Paternal Grandfather    Leukemia Paternal Grandmother    Cancer Maternal Grandmother        unknown type   Colon cancer Neg Hx    Esophageal cancer Neg Hx    Rectal cancer Neg Hx    Stomach cancer Neg Hx    Social History   Tobacco Use   Smoking status: Former    Packs/day: 0.25    Years: 10.00    Total pack years: 2.50    Types: Cigarettes    Quit date: 11/15/1968    Years since quitting: 53.7   Smokeless tobacco: Never  Vaping Use   Vaping Use: Never used  Substance Use Topics   Alcohol use: No   Drug use: No   Current Outpatient Medications  Medication Sig Dispense Refill   acetaminophen (TYLENOL) 500 MG tablet Take 1 tablet by mouth daily.     benzonatate  (TESSALON) 200 MG capsule TAKE 1 CAPSULE AS NEEDED FOR COUGH. 90 capsule 3   calcium-vitamin D (OSCAL WITH D) 500-200 MG-UNIT tablet Take 1 tablet by mouth.     dexlansoprazole (DEXILANT) 60 MG capsule Take 1 capsule by mouth daily.     famotidine (PEPCID) 20 MG tablet Take 1 tablet (20 mg total) by mouth 2 (two) times daily. 825 tablet 3   folic acid (FOLVITE) 1 MG tablet Take 1 tablet by mouth daily.     gabapentin (NEURONTIN) 250 MG/5ML solution TAKE 2 ML AT BEDTIME. Pt takes as needed 180 mL 3   omeprazole (PRILOSEC) 40 MG capsule TAKE ONE CAPSULE BY MOUTH TWICE DAILY 60 capsule 2   pseudoephedrine-guaifenesin (MUCINEX D) 60-600 MG 12 hr tablet Take 1 tablet by mouth daily.      sucralfate (CARAFATE) 1 g tablet Take 1 tablet (1 g total) by mouth every 6 (six) hours as needed. Slowly dissolve 1 tablet in 1 Tbs of water before ingesting 60 tablet 3   Acetaminophen (TYLENOL EXTRA STRENGTH PO) Take 350 mg by mouth as needed.  (Patient not taking: Reported on 07/16/2022)     baclofen (LIORESAL) 10 MG tablet  (Patient not taking: Reported on 07/16/2022)     cephALEXin (KEFLEX) 500 MG capsule Take 500 mg by mouth 2 (two) times daily. (Patient not taking: Reported on 07/16/2022)     estrogens, conjugated, (PREMARIN) 0.3 MG tablet  (Patient not taking: Reported on 07/16/2022)     metoprolol tartrate (LOPRESSOR) 50 MG tablet  (Patient not taking: Reported on 07/16/2022)     nitrofurantoin, macrocrystal-monohydrate, (MACROBID) 100 MG capsule  (Patient not taking: Reported on 07/16/2022)     PAXLOVID, 300/100, 20 x 150 MG & 10 x '100MG'$  TBPK Take by mouth. (Patient not taking: Reported on 07/16/2022)     pentoxifylline (TRENTAL) 400 MG CR tablet Take 400 mg by mouth 3 (three) times daily. (Patient not taking: Reported on 07/16/2022)     terbinafine (LAMISIL) 250 MG tablet  (Patient not taking: Reported on 07/16/2022)     Tiotropium Bromide Monohydrate (SPIRIVA RESPIMAT) 1.25 MCG/ACT AERS Inhale 1 puff into the lungs daily.  (Patient not taking: Reported on 07/16/2022) 4 g 5   triamcinolone (KENALOG) 0.025 % cream  (Patient  not taking: Reported on 07/16/2022)     No current facility-administered medications for this visit.   Allergies  Allergen Reactions   Prednisone    Sulfa Antibiotics     Other reaction(s): Unknown   Cortisone Other (See Comments)    unsure   Other Other (See Comments)   Sulfasalazine Other (See Comments)    Unsure of reaction   Sulfonamide Derivatives Other (See Comments)    Unsure of reaction     Review of Systems: All systems reviewed and negative except where noted in HPI.      Physical Exam: BP 102/80   Pulse 69   Ht '5\' 4"'$  (1.626 m)   Wt 77 lb 6 oz (35.1 kg)   BMI 13.28 kg/m  Constitutional: Pleasant,well-developed, female in no acute distress. Abdominal: Soft, nondistended, mild lower abdominal TTP. There are no masses palpable.  Neurological: Alert and oriented to person place and time. Psychiatric: Normal mood and affect. Behavior is normal.   ASSESSMENT: 81 y.o. female here for assessment of the following  1. Gastroesophageal reflux disease, unspecified whether esophagitis present   2. Dysphagia, unspecified type   3. Long-term current use of proton pump inhibitor therapy   4. Lower abdominal pain   5. Constipation, unspecified constipation type   6. Hemorrhoids, unspecified hemorrhoid type    Severe refractory GERD in the setting of Hill grade 4 GEJ laxity, and some dysmotility.  She is not a candidate for TIF.  She has been evaluated by surgery in the past, she is not interested in pursuing surgery despite her severe symptoms given she thinks she would have a very difficult time recovering from it, which is understandable.  That being said she is failing oral medications and I do not have great options to treat her reflux.  We have tried baclofen and has not been tolerated per her report.  She is on twice daily PPI, twice daily H2 blocker, and Carafate.  The  Carafate seems to help her more than anything else.  She understands risks of chronic PPI use, has a history of osteopenia, understands increased fracture risk, wants to continue it.  Dysphagia is main symptom bothering her more recently, appears to be localized in her posterior pharynx, wonder if this is related to cricopharyngeus muscle dysfunction/stenosis.  She has never had a prior dilation from what I can see.  Given her bothersome dysphagia, while this could be due to dysmotility, I offered her an empiric dilation to see if that will help.  We discussed risk and benefits.  She does have chronic lung disease and bronchiectasis but is not oxygen dependent and has done well with her procedures in the past.  She feels she is stable enough to have an EGD done for this purpose.  Further recommendations pending that result.  Otherwise continue max medical therapy given she declined surgery.  Regarding her abdominal pain, unclear if this is due to constipation, possible, prior CT scan a few years ago looked okay.  She has some mild tenderness on exam.  We will check basic labs today to make sure okay as she has not had them in a while.  Recommended MiraLAX and titrate up for goal bowel movement daily, she does not want to take MiraLAX and would prefer Dulcolax.  I gave her some samples of IBgard.  If her symptoms are not improved with management of her constipation IBgard, may pursue a CT scan.  Hopefully management of constipation will help her hemorrhoids.  She  declined DRE in the office today to evaluate this.  Recommend Calmol 4 suppositories over-the-counter to see if that will help with inflammation.  Can consider banding if symptoms persist.   PLAN: - EGD at [facility] with dilation - continue reflux regimen - omeprazole BID, pepcid BID, carafate - she declines surgical evaluation for reflux  - CBC, CMET today - Dulcolax daily, recommended Miralax and she declined - IB gard samples - if symptoms  persist will proceed with a CT abdomen / pelvis - Calmol4 suppositories PRN OTC for hemorhoids  I spent 45 minutes of time, including in depth chart review, face-to-face time with the patient, coordinating care, and documentation of this encounter   Jolly Mango, MD [facility]

## 2022-07-16 NOTE — Patient Instructions (Addendum)
You have been scheduled for an endoscopy. Please follow written instructions given to you at your visit today. If you use inhalers (even only as needed), please bring them with you on the day of your procedure.  Your provider has requested that you go to the basement level for lab work before leaving today. Press "B" on the elevator. The lab is located at the first door on the left as you exit the elevator.  Continue omeprazole.   Continue antireflux measures.  Please purchase the following medications over the counter and take as directed: Dulcolax once daily  IBGard as per box directions (we have given you samples of this as well).  Calmol 4 suppositories as directed (you can get this off of [place]).  _______________________________________________________  If you are age 43 or older, your body mass index should be between 23-30. Your Body mass index is 13.28 kg/m. If this is out of the aforementioned range listed, please consider follow up with your Primary Care Provider.  If you are age 10 or younger, your body mass index should be between 19-25. Your Body mass index is 13.28 kg/m. If this is out of the aformentioned range listed, please consider follow up with your Primary Care Provider.   ________________________________________________________  The [facility] providers would like to encourage you to use [facility] to communicate with providers for non-urgent requests or questions.  Due to long hold times on the telephone, sending your provider a message by [facility] may be a faster and more efficient way to get a response.  Please allow 48 business hours for a response.  Please remember that this is for non-urgent requests.  _______________________________________________________  Due to recent changes in healthcare laws, you may see the results of your imaging and laboratory studies on MyChart before your provider has had a chance to review them.  We understand that in some cases  there may be results that are confusing or concerning to you. Not all laboratory results come back in the same time frame and the provider may be waiting for multiple results in order to interpret others.  Please give [place] 48 hours in order for your provider to thoroughly review all the results before contacting the office for clarification of your results.

## 2022-07-16 NOTE — H&P (View-Only) (Signed)
HPI :  81 year old female here for follow-up visit for reflux, history of chronic lung disease with bronchiectasis, osteopenia, dysphagia.   See prior notes for details of her case.  Recall she has had refractory GERD. Prior work-up has shown a DeMeester score of greater than 150 during pH study.  She has had an extensive evaluation for this in recent years.  She has regurgitation, history of aspiration, we think it is related to her bronchiectasis and chronic lung disease.  She has failed maximal medical therapy.  I previously referred her to Dr. Bryan Lemma for another opinion for possible TIF.  She has a Hill grade 4 view of the cardia, she also had esophageal manometry done showing mild EG J outflow obstruction.  Given these findings she was deemed to not be a candidate for TIF and was more appropriate for surgical repair of the hernia and fundoplication.  Given her lung disease, frailty, age, she has wanted to avoid surgical treatment for this.  At her previous visit she was taking Dexilant 60 mg a day and Carafate as needed.  I had since added Pepcid 20 mg twice daily.  Somewhere along the line we had tried her on baclofen at night for nocturnal symptoms, she states this made her quite sleepy and she could not tolerate it.  Unfortunately insurance stopped covering her Dexilant she was switched to omeprazole 40 mg twice daily.  Despite these measures she has a lot of heartburn and reflux nearly every day, can sometimes depend on what she eats.  She states despite this regimen she continues to have frequent symptoms that bother her.  Symptoms are definitely worse if she does not take her medications.  She is been having worsening dysphagia since have last seen her, that one of her main complaints.  She feels it in her throat, has a hard time getting food past her throat, and then goes down slowly after that.  She is on a mostly soft diet.  She eats quite slowly.  She has never had a prior dilation from  what I can see in her record as symptoms mostly reflux related in the past.  She had a barium swallow which showed some nonspecific dysmotility, also had cricopharyngeal dysfunction suspected, and marked reflux.  Recall she also has osteopenia and has a history of falls.  She adamantly does not want any surgery for her reflux as she does not think she will recover well from it.  She has bronchiectasis and is frail, she does not require any oxygen however. She states her breathing is at baseline.  She otherwise endorses some lower abdominal pains for the past "several months".  She states she has some mild discomfort there at baseline but can clearly be worsened at times.  She endorses some constipation more recently, she has some relief of her discomfort with a bowel movement.  She has had some irritated hemorrhoids from straining.  She has some nausea at times but no vomiting.  She inquires about options.  She states she does not want to take any liquids or powdered laxatives, she would prefer pills.  She has been using sitz bath's for her hemorrhoids, she inquires about other ways to treat these.  Her colonoscopy is up-to-date as below.  She had a CT scan 2 years ago which did not show any concerning pathology in her abdomen.     Prior work-up:  Colonoscopy 11/20/18 -  The perianal and digital rectal examinations were normal. - The terminal  ileum appeared normal. - A 3 mm polyp was found in the ileocecal valve. The polyp was sessile. The polyp was removed with a cold snare. Resection and retrieval were complete. - A 3 mm polyp was found in the rectum. The polyp was sessile. The polyp was removed with a cold snare. Resection and retrieval were complete. - The exam was otherwise without abnormality other than some tortousity. - Biopsies for histology were taken with a cold forceps from the right colon and left colon for evaluation of microscopic colitis given the patient's incidental complaints of  persistent loose Stools.    -EGD 08/2015 - 1cm HH, gastritis without ulceration, HP negative -pH study done in 2016 showing Demeester score of 152.7 but only symptom index of 23% for reflux. Most of reflux was weakly acidic. -Esophageal manometry showed what was thought to be nutcracker esophagus at the time; No swallows with DCI > 8000 - GES normal 2016 -Esophogram 10/10/2017  - small sliding type hiatal hernia, no distal stricture / mass, normal motility -Repeat manometry 03/10/2018 - hypercontractile esophagus, no other significant pathology; single swallow with DCI >8000 -EGD 11/20/2018- no hiatal hernia, Hill grade 2 valve, slightly irregular Z line but no BP, 3 to 4 mm duodenal adenoma removed by cold snare with otherwise normal-appearing stomach and duodenum. -EGD 07/2020-superficial ulcers in the mid esophagus (path benign), Hill grade 4, 1 cm axial by 4 cm transverse width hiatal hernia, mild gastritis -Esophageal Manometry 10/2020-normal peristalsis, normal resting pressure at EGJ with incomplete relaxation (IRP 17.6, normal <15) c/w some degree of EGJ outflow obstruction, but not achalasia   Barium study 10/06/21: IMPRESSION: 1. Tiny sliding hiatal hernia. Marked gastroesophageal reflux elicited. 2. 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 3. Evidence of mild reflux esophagitis. No evidence of esophageal mass or stricture.   Past Medical History:  Diagnosis Date   Allergy    Arthritis    Atherosclerosis of aorta ([facility]) 2019   ct   BOOP (bronchiolitis obliterans with organizing pneumonia) ([place]) 2003   Bronchiectasis    Cataract    Esophageal dysmotility    Esophageal ulcer    Gastroparesis    GERD (gastroesophageal reflux disease)    severe   Hiatal hernia    Iron deficiency anemia    Macular degeneration (senile) of retina    Neuropathy    Nutcracker esophagus     Osteopenia    Pneumonia    Raynaud's disease    Recurrent pneumonia    Due to aspiration   Thrombocytosis      Past Surgical History:  Procedure Laterality Date   48 HOUR [place] STUDY N/A 10/13/2015   Procedure: 24 HOUR [place] STUDY;  Surgeon: Gatha Mayer, MD;  Location: [facility];  Service: Endoscopy;  Laterality: N/A;   BREAST LUMPECTOMY     left, x 2   CATARACT EXTRACTION     both eyes   CHOLECYSTECTOMY  2005   COLONOSCOPY     ESOPHAGEAL MANOMETRY N/A 10/06/2015   Procedure: ESOPHAGEAL MANOMETRY (EM);  Surgeon: Manus Gunning, MD;  Location: [facility];  Service: Gastroenterology;  Laterality: N/A;   ESOPHAGEAL MANOMETRY N/A 03/10/2018   Procedure: ESOPHAGEAL MANOMETRY (EM);  Surgeon: Mauri Pole, MD;  Location: [facility];  Service: Endoscopy;  Laterality: N/A;   ESOPHAGEAL MANOMETRY N/A 10/15/2020   Procedure: ESOPHAGEAL MANOMETRY (EM);  Surgeon: Lavena Bullion, DO;  Location: [facility];  Service: Gastroenterology;  Laterality: N/A;  LUNG BIOPSY     vats right 2003   TOTAL ABDOMINAL HYSTERECTOMY     UPPER GASTROINTESTINAL ENDOSCOPY     Family History  Problem Relation Age of Onset   Lymphoma Mother    Heart disease Father    Lung cancer Paternal Uncle    Heart disease Paternal Grandfather    Leukemia Paternal Grandmother    Cancer Maternal Grandmother        unknown type   Colon cancer Neg Hx    Esophageal cancer Neg Hx    Rectal cancer Neg Hx    Stomach cancer Neg Hx    Social History   Tobacco Use   Smoking status: Former    Packs/day: 0.25    Years: 10.00    Total pack years: 2.50    Types: Cigarettes    Quit date: 11/15/1968    Years since quitting: 53.7   Smokeless tobacco: Never  Vaping Use   Vaping Use: Never used  Substance Use Topics   Alcohol use: No   Drug use: No   Current Outpatient Medications  Medication Sig Dispense Refill   acetaminophen (TYLENOL) 500 MG tablet Take 1 tablet by mouth daily.     benzonatate  (TESSALON) 200 MG capsule TAKE 1 CAPSULE AS NEEDED FOR COUGH. 90 capsule 3   calcium-vitamin D (OSCAL WITH D) 500-200 MG-UNIT tablet Take 1 tablet by mouth.     dexlansoprazole (DEXILANT) 60 MG capsule Take 1 capsule by mouth daily.     famotidine (PEPCID) 20 MG tablet Take 1 tablet (20 mg total) by mouth 2 (two) times daily. 194 tablet 3   folic acid (FOLVITE) 1 MG tablet Take 1 tablet by mouth daily.     gabapentin (NEURONTIN) 250 MG/5ML solution TAKE 2 ML AT BEDTIME. Pt takes as needed 180 mL 3   omeprazole (PRILOSEC) 40 MG capsule TAKE ONE CAPSULE BY MOUTH TWICE DAILY 60 capsule 2   pseudoephedrine-guaifenesin (MUCINEX D) 60-600 MG 12 hr tablet Take 1 tablet by mouth daily.      sucralfate (CARAFATE) 1 g tablet Take 1 tablet (1 g total) by mouth every 6 (six) hours as needed. Slowly dissolve 1 tablet in 1 Tbs of water before ingesting 60 tablet 3   Acetaminophen (TYLENOL EXTRA STRENGTH PO) Take 350 mg by mouth as needed.  (Patient not taking: Reported on 07/16/2022)     baclofen (LIORESAL) 10 MG tablet  (Patient not taking: Reported on 07/16/2022)     cephALEXin (KEFLEX) 500 MG capsule Take 500 mg by mouth 2 (two) times daily. (Patient not taking: Reported on 07/16/2022)     estrogens, conjugated, (PREMARIN) 0.3 MG tablet  (Patient not taking: Reported on 07/16/2022)     metoprolol tartrate (LOPRESSOR) 50 MG tablet  (Patient not taking: Reported on 07/16/2022)     nitrofurantoin, macrocrystal-monohydrate, (MACROBID) 100 MG capsule  (Patient not taking: Reported on 07/16/2022)     PAXLOVID, 300/100, 20 x 150 MG & 10 x '100MG'$  TBPK Take by mouth. (Patient not taking: Reported on 07/16/2022)     pentoxifylline (TRENTAL) 400 MG CR tablet Take 400 mg by mouth 3 (three) times daily. (Patient not taking: Reported on 07/16/2022)     terbinafine (LAMISIL) 250 MG tablet  (Patient not taking: Reported on 07/16/2022)     Tiotropium Bromide Monohydrate (SPIRIVA RESPIMAT) 1.25 MCG/ACT AERS Inhale 1 puff into the lungs daily.  (Patient not taking: Reported on 07/16/2022) 4 g 5   triamcinolone (KENALOG) 0.025 % cream  (Patient  not taking: Reported on 07/16/2022)     No current facility-administered medications for this visit.   Allergies  Allergen Reactions   Prednisone    Sulfa Antibiotics     Other reaction(s): Unknown   Cortisone Other (See Comments)    unsure   Other Other (See Comments)   Sulfasalazine Other (See Comments)    Unsure of reaction   Sulfonamide Derivatives Other (See Comments)    Unsure of reaction     Review of Systems: All systems reviewed and negative except where noted in HPI.      Physical Exam: BP 102/80   Pulse 69   Ht '5\' 4"'$  (1.626 m)   Wt 77 lb 6 oz (35.1 kg)   BMI 13.28 kg/m  Constitutional: Pleasant,well-developed, female in no acute distress. Abdominal: Soft, nondistended, mild lower abdominal TTP. There are no masses palpable.  Neurological: Alert and oriented to person place and time. Psychiatric: Normal mood and affect. Behavior is normal.   ASSESSMENT: 81 y.o. female here for assessment of the following  1. Gastroesophageal reflux disease, unspecified whether esophagitis present   2. Dysphagia, unspecified type   3. Long-term current use of proton pump inhibitor therapy   4. Lower abdominal pain   5. Constipation, unspecified constipation type   6. Hemorrhoids, unspecified hemorrhoid type    Severe refractory GERD in the setting of Hill grade 4 GEJ laxity, and some dysmotility.  She is not a candidate for TIF.  She has been evaluated by surgery in the past, she is not interested in pursuing surgery despite her severe symptoms given she thinks she would have a very difficult time recovering from it, which is understandable.  That being said she is failing oral medications and I do not have great options to treat her reflux.  We have tried baclofen and has not been tolerated per her report.  She is on twice daily PPI, twice daily H2 blocker, and Carafate.  The  Carafate seems to help her more than anything else.  She understands risks of chronic PPI use, has a history of osteopenia, understands increased fracture risk, wants to continue it.  Dysphagia is main symptom bothering her more recently, appears to be localized in her posterior pharynx, wonder if this is related to cricopharyngeus muscle dysfunction/stenosis.  She has never had a prior dilation from what I can see.  Given her bothersome dysphagia, while this could be due to dysmotility, I offered her an empiric dilation to see if that will help.  We discussed risk and benefits.  She does have chronic lung disease and bronchiectasis but is not oxygen dependent and has done well with her procedures in the past.  She feels she is stable enough to have an EGD done for this purpose.  Further recommendations pending that result.  Otherwise continue max medical therapy given she declined surgery.  Regarding her abdominal pain, unclear if this is due to constipation, possible, prior CT scan a few years ago looked okay.  She has some mild tenderness on exam.  We will check basic labs today to make sure okay as she has not had them in a while.  Recommended MiraLAX and titrate up for goal bowel movement daily, she does not want to take MiraLAX and would prefer Dulcolax.  I gave her some samples of IBgard.  If her symptoms are not improved with management of her constipation IBgard, may pursue a CT scan.  Hopefully management of constipation will help her hemorrhoids.  She  declined DRE in the office today to evaluate this.  Recommend Calmol 4 suppositories over-the-counter to see if that will help with inflammation.  Can consider banding if symptoms persist.   PLAN: - EGD at the [facility] with dilation - continue reflux regimen - omeprazole BID, pepcid BID, carafate - she declines surgical evaluation for reflux  - CBC, CMET today - Dulcolax daily, recommended Miralax and she declined - IB gard samples - if symptoms  persist will proceed with a CT abdomen / pelvis - Calmol4 suppositories PRN OTC for hemorhoids  I spent 45 minutes of time, including in depth chart review, face-to-face time with the patient, coordinating care, and documentation of this encounter   Jolly Mango, MD [facility]

## 2022-07-20 ENCOUNTER — Other Ambulatory Visit: Payer: Self-pay

## 2022-07-20 ENCOUNTER — Ambulatory Visit: Payer: Medicare HMO | Admitting: Physician Assistant

## 2022-07-20 DIAGNOSIS — R7401 Elevation of levels of liver transaminase levels: Secondary | ICD-10-CM

## 2022-07-21 NOTE — Telephone Encounter (Signed)
I have an opening on Oct 19 at 11am, can put on cancellation list if she wants earlier. thanks

## 2022-07-22 DIAGNOSIS — R296 Repeated falls: Secondary | ICD-10-CM | POA: Diagnosis not present

## 2022-07-22 DIAGNOSIS — E871 Hypo-osmolality and hyponatremia: Secondary | ICD-10-CM | POA: Diagnosis not present

## 2022-07-22 DIAGNOSIS — G629 Polyneuropathy, unspecified: Secondary | ICD-10-CM | POA: Diagnosis not present

## 2022-07-23 ENCOUNTER — Encounter: Payer: Medicare HMO | Admitting: Gastroenterology

## 2022-07-26 ENCOUNTER — Telehealth: Payer: Self-pay | Admitting: *Deleted

## 2022-07-26 NOTE — Telephone Encounter (Signed)
Dr. Havery Moros,  This pt is scheduled with you on 9/19.  She has multiple co-morbities and I would classify her as a ASA IV; her procedure will need to be done at the hospital.  Thanks,  Osvaldo Angst

## 2022-07-27 ENCOUNTER — Other Ambulatory Visit: Payer: Self-pay

## 2022-07-27 DIAGNOSIS — R1030 Lower abdominal pain, unspecified: Secondary | ICD-10-CM

## 2022-07-27 DIAGNOSIS — K219 Gastro-esophageal reflux disease without esophagitis: Secondary | ICD-10-CM

## 2022-07-27 DIAGNOSIS — R1310 Dysphagia, unspecified: Secondary | ICD-10-CM

## 2022-07-27 NOTE — Telephone Encounter (Signed)
John, thanks for letting me know, I understand.  [place] can you please let the patient know that her procedure needs to be canceled for 9/19 at the [facility], her procedure needs to be done at the hospital.  I think you may have an opening on 9/18 at the hospital if you can put her in that spot if she is available to do it at that time / date.  Thanks

## 2022-07-27 NOTE — Telephone Encounter (Signed)
Called and spoke with patient. She is aware that her procedure will need to be at [facility] for anesthesia support due to her multiple co-morbidities. Pt is aware that we have moved her EGD to [facility] on Monday, 08/02/22. Pt is aware that she will arrive at [facility] by 9 am with a care partner, procedure will start at 10:30 am. Pt is aware that instructions are the same. Pt knows that we will cancel Tracy appt on 08/03/22. Pt verbalized understanding of all information and had no concerns at the end of the call.   [facility] EGD cancelled. Secure staff message sent to pre-certification team (Es and Heron Sabins) to inform them of procedure location change for insurance authorization. Updated instructions sent to patient via MyChart.

## 2022-07-29 ENCOUNTER — Encounter (HOSPITAL_COMMUNITY): Payer: Self-pay | Admitting: Gastroenterology

## 2022-07-29 NOTE — Progress Notes (Signed)
Attempted to obtain medical history via telephone, unable to reach at this time. HIPAA compliant voicemail message left requesting return call to pre surgical testing department. 

## 2022-07-30 ENCOUNTER — Encounter (HOSPITAL_COMMUNITY): Payer: Self-pay | Admitting: Gastroenterology

## 2022-07-30 ENCOUNTER — Other Ambulatory Visit: Payer: Self-pay

## 2022-08-02 ENCOUNTER — Ambulatory Visit (HOSPITAL_COMMUNITY)
Admission: RE | Admit: 2022-08-02 | Discharge: 2022-08-02 | Disposition: A | Discharge status: Home / Self Care | Payer: Medicare HMO | Attending: Gastroenterology | Admitting: Gastroenterology

## 2022-08-02 ENCOUNTER — Encounter (HOSPITAL_COMMUNITY)
Admission: RE | Disposition: A | Discharge status: Home / Self Care | Payer: Self-pay | Source: Home / Self Care | Attending: Gastroenterology

## 2022-08-02 ENCOUNTER — Encounter (HOSPITAL_COMMUNITY): Payer: Self-pay | Admitting: Gastroenterology

## 2022-08-02 ENCOUNTER — Ambulatory Visit (HOSPITAL_BASED_OUTPATIENT_CLINIC_OR_DEPARTMENT_OTHER): Payer: Medicare HMO | Admitting: Anesthesiology

## 2022-08-02 ENCOUNTER — Telehealth: Payer: Self-pay | Admitting: Gastroenterology

## 2022-08-02 ENCOUNTER — Other Ambulatory Visit: Payer: Self-pay

## 2022-08-02 ENCOUNTER — Ambulatory Visit (HOSPITAL_COMMUNITY): Payer: Medicare HMO | Admitting: Anesthesiology

## 2022-08-02 DIAGNOSIS — X58XXXS Exposure to other specified factors, sequela: Secondary | ICD-10-CM | POA: Insufficient documentation

## 2022-08-02 DIAGNOSIS — T183XXS Foreign body in small intestine, sequela: Secondary | ICD-10-CM | POA: Insufficient documentation

## 2022-08-02 DIAGNOSIS — K649 Unspecified hemorrhoids: Secondary | ICD-10-CM | POA: Diagnosis not present

## 2022-08-02 DIAGNOSIS — T183XXA Foreign body in small intestine, initial encounter: Secondary | ICD-10-CM

## 2022-08-02 DIAGNOSIS — K219 Gastro-esophageal reflux disease without esophagitis: Secondary | ICD-10-CM

## 2022-08-02 DIAGNOSIS — K279 Peptic ulcer, site unspecified, unspecified as acute or chronic, without hemorrhage or perforation: Secondary | ICD-10-CM | POA: Diagnosis not present

## 2022-08-02 DIAGNOSIS — K2100 Gastro-esophageal reflux disease with esophagitis, without bleeding: Secondary | ICD-10-CM | POA: Insufficient documentation

## 2022-08-02 DIAGNOSIS — Z87891 Personal history of nicotine dependence: Secondary | ICD-10-CM | POA: Insufficient documentation

## 2022-08-02 DIAGNOSIS — R1310 Dysphagia, unspecified: Secondary | ICD-10-CM | POA: Insufficient documentation

## 2022-08-02 DIAGNOSIS — Z8711 Personal history of peptic ulcer disease: Secondary | ICD-10-CM | POA: Insufficient documentation

## 2022-08-02 DIAGNOSIS — R1030 Lower abdominal pain, unspecified: Secondary | ICD-10-CM

## 2022-08-02 DIAGNOSIS — K5900 Constipation, unspecified: Secondary | ICD-10-CM | POA: Insufficient documentation

## 2022-08-02 DIAGNOSIS — K449 Diaphragmatic hernia without obstruction or gangrene: Secondary | ICD-10-CM | POA: Insufficient documentation

## 2022-08-02 DIAGNOSIS — R54 Age-related physical debility: Secondary | ICD-10-CM | POA: Insufficient documentation

## 2022-08-02 DIAGNOSIS — Z79899 Other long term (current) drug therapy: Secondary | ICD-10-CM | POA: Insufficient documentation

## 2022-08-02 DIAGNOSIS — M8580 Other specified disorders of bone density and structure, unspecified site: Secondary | ICD-10-CM | POA: Diagnosis not present

## 2022-08-02 DIAGNOSIS — J8410 Pulmonary fibrosis, unspecified: Secondary | ICD-10-CM | POA: Diagnosis not present

## 2022-08-02 HISTORY — PX: FOREIGN BODY REMOVAL: SHX962

## 2022-08-02 HISTORY — DX: Headache, unspecified: R51.9

## 2022-08-02 HISTORY — PX: SAVORY DILATION: SHX5439

## 2022-08-02 HISTORY — PX: ESOPHAGOGASTRODUODENOSCOPY (EGD) WITH PROPOFOL: SHX5813

## 2022-08-02 SURGERY — ESOPHAGOGASTRODUODENOSCOPY (EGD) WITH PROPOFOL
Anesthesia: Monitor Anesthesia Care

## 2022-08-02 MED ORDER — PROPOFOL 500 MG/50ML IV EMUL
INTRAVENOUS | Status: AC
  Filled 2022-08-02: qty 50

## 2022-08-02 MED ORDER — SODIUM CHLORIDE 0.9 % IV SOLN: INTRAVENOUS | Status: DC

## 2022-08-02 MED ORDER — PROPOFOL 500 MG/50ML IV EMUL
INTRAVENOUS | Status: DC | PRN
  Administered 2022-08-02: 100 ug/kg/min via INTRAVENOUS

## 2022-08-02 MED ORDER — LACTATED RINGERS IV SOLN: INTRAVENOUS | Status: DC

## 2022-08-02 MED ORDER — PROPOFOL 10 MG/ML IV BOLUS
INTRAVENOUS | Status: DC | PRN
  Administered 2022-08-02: 10 mg via INTRAVENOUS

## 2022-08-02 SURGICAL SUPPLY — 15 items

## 2022-08-02 NOTE — Telephone Encounter (Signed)
Inbound call from patient stating that she had a procedure with Dr. Havery Moros at [facility] today. Patient is seeking advice if there is anyway we can send a hard copy to her. Please advise.  [phone]

## 2022-08-02 NOTE — Anesthesia Postprocedure Evaluation (Signed)
Anesthesia Post Note  Patient: JANAIA KOZEL  Procedure(s) Performed: ESOPHAGOGASTRODUODENOSCOPY (EGD) WITH PROPOFOL BALLOON DILATION FOREIGN BODY REMOVAL SAVORY DILATION     Patient location during evaluation: Endoscopy Anesthesia Type: MAC Level of consciousness: awake and alert Pain management: pain level controlled Vital Signs Assessment: post-procedure vital signs reviewed and stable Respiratory status: spontaneous breathing, nonlabored ventilation and respiratory function stable Cardiovascular status: blood pressure returned to baseline and stable Postop Assessment: no apparent nausea or vomiting Anesthetic complications: no   No notable events documented.  Last Vitals:  Vitals:   08/02/22 1140 08/02/22 1148  BP: (!) 142/82 (!) 149/74  Pulse: 73 (!) 58  Resp: 20 17  Temp:    SpO2: 98% 100%    Last Pain:  Vitals:   08/02/22 1148  TempSrc:   PainSc: 0-No pain                 Lidia Collum

## 2022-08-02 NOTE — Anesthesia Preprocedure Evaluation (Signed)
Anesthesia Evaluation  Patient identified by MRN, date of birth, ID band Patient awake    Reviewed: Allergy & Precautions, NPO status , Patient's Chart, lab work & pertinent test results  History of Anesthesia Complications Negative for: history of anesthetic complications  Airway Mallampati: II  TM Distance: >3 FB Neck ROM: Full    Dental   Pulmonary neg pulmonary ROS, former smoker,  Pulmonary fibrosis   Pulmonary exam normal        Cardiovascular negative cardio ROS Normal cardiovascular exam     Neuro/Psych negative neurological ROS     GI/Hepatic Neg liver ROS, hiatal hernia, PUD, GERD  ,Dysphagia    Endo/Other  negative endocrine ROS  Renal/GU negative Renal ROS  negative genitourinary   Musculoskeletal negative musculoskeletal ROS (+)   Abdominal   Peds  Hematology negative hematology ROS (+)   Anesthesia Other Findings   Reproductive/Obstetrics                            Anesthesia Physical Anesthesia Plan  ASA: 2  Anesthesia Plan: MAC   Post-op Pain Management: Minimal or no pain anticipated   Induction: Intravenous  PONV Risk Score and Plan: 2 and Propofol infusion, TIVA and Treatment may vary due to age or medical condition  Airway Management Planned: Natural Airway, Nasal Cannula and Simple Face Mask  Additional Equipment: None  Intra-op Plan:   Post-operative Plan:   Informed Consent: I have reviewed the patients History and Physical, chart, labs and discussed the procedure including the risks, benefits and alternatives for the proposed anesthesia with the patient or authorized representative who has indicated his/her understanding and acceptance.       Plan Discussed with:   Anesthesia Plan Comments:         Anesthesia Quick Evaluation

## 2022-08-02 NOTE — Op Note (Signed)
[facility] Patient Name: [name] Procedure Date: 08/02/2022 MRN: [id number] Attending MD: Carlota Raspberry. Havery Moros , MD Date of Birth: 1941/04/09 CSN: [id number] Age: 81 Admit Type: Outpatient Procedure:                Upper GI endoscopy Indications:              Dysphagia, chronic GERD - not candidate for TIF -                            on max medical therapy. EGD to evaluate / treat                            dysphagia which is persistent, prominent                            cricopharyngeus on barium study, no prior dilation Providers:                Carlota Raspberry. Havery Moros, MD, Benay Pillow, RN, [facility] Technician, Technician, Brien Mates, RNFA Referring MD:              Medicines:                Monitored Anesthesia Care Complications:            No immediate complications. Estimated blood loss:                            Minimal. Estimated Blood Loss:     Estimated blood loss was minimal. Procedure:                Pre-Anesthesia Assessment:                           - Prior to the procedure, a History and Physical                            was performed, and patient medications and                            allergies were reviewed. The patient's tolerance of                            previous anesthesia was also reviewed. The risks                            and benefits of the procedure and the sedation                            options and risks were discussed with the patient.                            All questions were answered, and informed consent  was obtained. Prior Anticoagulants: The patient has                            taken no previous anticoagulant or antiplatelet                            agents. ASA Grade Assessment: IV - A patient with                            severe systemic disease that is a constant threat                            to life. After reviewing the risks and  benefits,                            the patient was deemed in satisfactory condition to                            undergo the procedure.                           After obtaining informed consent, the endoscope was                            passed under direct vision. Throughout the                            procedure, the patient's blood pressure, pulse, and                            oxygen saturations were monitored continuously. The                            [id number]-H190 ([id number]) Olympus endoscope was introduced                            through the mouth, and advanced to the second part                            of duodenum. The upper GI endoscopy was                            accomplished without difficulty. The patient                            tolerated the procedure well. Scope In: Scope Out: Findings:      Esophagogastric landmarks were identified: the Z-line was found at 39       cm, the gastroesophageal junction was found at 39 cm and the upper       extent of the gastric folds was found at 41 cm from the incisors.      A 2 cm hiatal hernia was present.      The gastroesophageal flap valve was visualized endoscopically and       classified  as Hill Grade IV (no fold, wide open lumen, hiatal hernia       present).      The exam of the esophagus was otherwise normal. No overt stenosis /       stricture noted.      A guidewire was placed and the scope was withdrawn. Empiric dilation was       performed in the entire esophagus with a Savary dilator with mild       resistance at 17 mm. Relook endoscopy showed no mucosal wrent      The entire examined stomach was normal.      A foreign body was found in the second portion of the duodenum. It was       hard to palpation with the forceps. Initially was concerned for a       retained bone vs. vegetable matter, and removal was accomplished with a       large-capacity forceps. Once this was removed it was not a bone - but        was hard, suspect a stem of recently consumed fruit (grapes?)      The exam of the duodenum was otherwise normal. Impression:               - Esophagogastric landmarks identified.                           - 2 cm hiatal hernia.                           - Gastroesophageal flap valve classified as Hill                            Grade IV (no fold, wide open lumen, hiatal hernia                            present).                           - Normal esophagus otherwise - empiric dilation                            performed                           - Normal stomach.                           - Duodenal foreign body. Removal was successful.                           - Normal duodenum otherwise Moderate Sedation:      No moderate sedation, case performed with MAC Recommendation:           - Patient has a contact number available for                            emergencies. The signs and symptoms of potential  delayed complications were discussed with the                            patient. Return to normal activities tomorrow.                            Written discharge instructions were provided to the                            patient.                           - Advance diet as tolerated.                           - Continue present medications.                           - Await course post dilation Procedure Code(s):        --- Professional ---                           [phone], Esophagogastroduodenoscopy, flexible,                            transoral; with removal of foreign body(s)                           43248, Esophagogastroduodenoscopy, flexible,                            transoral; with insertion of guide wire followed by                            passage of dilator(s) through esophagus over guide                            wire Diagnosis Code(s):        --- Professional ---                           K44.9, Diaphragmatic hernia without obstruction  or                            gangrene                           T18.3XXS, Foreign body in small intestine, sequela                           R13.10, Dysphagia, unspecified CPT copyright 2019 American Medical Association. All rights reserved. The codes documented in this report are preliminary and upon coder review may  be revised to meet current compliance requirements. Remo Lipps P. Donyale Berthold, MD 08/02/2022 11:27:25 AM This report has been signed electronically. Number of Addenda: 0

## 2022-08-02 NOTE — Interval H&P Note (Signed)
History and Physical Interval Note: EGD with dilation scheduled for the hospital given patient's respiratory status. She states she feels the same since I have last seen her. Respiratory status she states is at baseline. No interval changes since I have seen her in the office. I have discussed risks / benefits of EGD / dilation, anesthesia and she wishes to proceed. Further recommendations pending the results.   08/02/2022 10:07 AM  [name]  has presented today for surgery, with the diagnosis of dysphagia, GERD, abdominal pain.  The various methods of treatment have been discussed with the patient and family. After consideration of risks, benefits and other options for treatment, the patient has consented to  Procedure(s): ESOPHAGOGASTRODUODENOSCOPY (EGD) WITH PROPOFOL (N/A) BALLOON DILATION (N/A) as a surgical intervention.  The patient's history has been reviewed, patient examined, no change in status, stable for surgery.  I have reviewed the patient's chart and labs.  Questions were answered to the patient's satisfaction.     [place]

## 2022-08-02 NOTE — Discharge Instructions (Signed)
YOU HAD AN ENDOSCOPIC PROCEDURE TODAY: Refer to the procedure report and other information in the discharge instructions given to you for any specific questions about what was found during the examination. If this information does not answer your questions, please call [place] [facility] office at [phone] to clarify.  ° °YOU SHOULD EXPECT: Some feelings of bloating in the abdomen. Passage of more gas than usual. Walking can help get rid of the air that was put into your GI tract during the procedure and reduce the bloating. If you had a lower endoscopy (such as a colonoscopy or flexible sigmoidoscopy) you may notice spotting of blood in your stool or on the toilet paper. Some abdominal soreness may be present for a day or two, also. ° °DIET: Your first meal following the procedure should be a light meal and then it is ok to progress to your normal diet. A half-sandwich or bowl of soup is an example of a good first meal. Heavy or fried foods are harder to digest and may make you feel nauseous or bloated. Drink plenty of fluids but you should avoid alcoholic beverages for 24 hours. If you had a esophageal dilation, please see attached instructions for diet.   ° °ACTIVITY: Your care partner should take you home directly after the procedure. You should plan to take it easy, moving slowly for the rest of the day. You can resume normal activity the day after the procedure however YOU SHOULD NOT DRIVE, use power tools, machinery or perform tasks that involve climbing or major physical exertion for 24 hours (because of the sedation medicines used during the test).  ° °SYMPTOMS TO REPORT IMMEDIATELY: °A gastroenterologist can be reached at any hour. Please call [phone]  for any of the following symptoms:  °Following lower endoscopy (colonoscopy, flexible sigmoidoscopy) °Excessive amounts of blood in the stool  °Significant tenderness, worsening of abdominal pains  °Swelling of the abdomen that is new, acute  °Fever of 100° or  higher  °Following upper endoscopy (EGD, EUS, ERCP, esophageal dilation) °Vomiting of blood or coffee ground material  °New, significant abdominal pain  °New, significant chest pain or pain under the shoulder blades  °Painful or persistently difficult swallowing  °New shortness of breath  °Black, tarry-looking or red, bloody stools ° °FOLLOW UP:  °If any biopsies were taken you will be contacted by phone or by letter within the next 1-3 weeks. Call [phone]  if you have not heard about the biopsies in 3 weeks.  °Please also call with any specific questions about appointments or follow up tests. ° °

## 2022-08-02 NOTE — Transfer of Care (Signed)
Immediate Anesthesia Transfer of Care Note  Patient: [name]  Procedure(s) Performed: ESOPHAGOGASTRODUODENOSCOPY (EGD) WITH PROPOFOL BALLOON DILATION FOREIGN BODY REMOVAL SAVORY DILATION  Patient Location: PACU  Anesthesia Type:MAC  Level of Consciousness: sedated  Airway & Oxygen Therapy: Patient Spontanous Breathing and Patient connected to face mask oxygen  Post-op Assessment: Report given to RN and Post -op Vital signs reviewed and stable  Post vital signs: Reviewed and stable  Last Vitals:  Vitals Value Taken Time  BP    Temp    Pulse 72 08/02/22 1117  Resp 14 08/02/22 1117  SpO2 100 % 08/02/22 1117  Vitals shown include unvalidated device data.  Last Pain:  Vitals:   08/02/22 0953  TempSrc: Temporal  PainSc: 7          Complications: No notable events documented.

## 2022-08-03 ENCOUNTER — Encounter: Payer: Medicare HMO | Admitting: Gastroenterology

## 2022-08-03 ENCOUNTER — Encounter (HOSPITAL_COMMUNITY): Payer: Self-pay | Admitting: Gastroenterology

## 2022-08-03 NOTE — Telephone Encounter (Signed)
Thank you, happy to hear this. She was given a copy of her procedure report after the exam yesterday, but if she didn't take it with her, thanks for mailing it. If she has a Mychart account I would think the result should be there too

## 2022-08-03 NOTE — Telephone Encounter (Signed)
Called and spoke with patient. I told pt that I will place a copy of her procedure report in the mail today. Pt confirmed address on file. Patient also wanted me to let you know that this is the best that she has breathed and swallowed in a while.

## 2022-08-06 ENCOUNTER — Other Ambulatory Visit: Payer: Self-pay | Admitting: Gastroenterology

## 2022-08-06 ENCOUNTER — Telehealth: Payer: Self-pay

## 2022-08-06 NOTE — Telephone Encounter (Signed)
Spoke with patient to remind her that she is due for repeat labs at this time. No appointment is necessary. Patient is aware that she can stop by the lab in the basement at her convenience between 7:30 AM - 5 PM, Monday through Friday. Patient verbalized understanding and had no concerns at the end of the call.   

## 2022-08-06 NOTE — Telephone Encounter (Signed)
-----   Message from Yevette Edwards, RN sent at 08/03/2022  9:35 AM EDT ----- Regarding: FW: Labs  ----- Message ----- From: Yevette Edwards, RN Sent: 08/03/2022  12:00 AM EDT To: Yevette Edwards, RN Subject: Labs                                           Hepatic function panel - order is in epic

## 2022-08-07 ENCOUNTER — Other Ambulatory Visit: Payer: Self-pay | Admitting: Gastroenterology

## 2022-08-10 DIAGNOSIS — H01002 Unspecified blepharitis right lower eyelid: Secondary | ICD-10-CM | POA: Diagnosis not present

## 2022-08-11 DIAGNOSIS — R296 Repeated falls: Secondary | ICD-10-CM | POA: Diagnosis not present

## 2022-08-11 DIAGNOSIS — G629 Polyneuropathy, unspecified: Secondary | ICD-10-CM | POA: Diagnosis not present

## 2022-08-16 ENCOUNTER — Ambulatory Visit (INDEPENDENT_AMBULATORY_CARE_PROVIDER_SITE_OTHER): Payer: Medicare HMO | Admitting: Neurology

## 2022-08-16 ENCOUNTER — Encounter: Payer: Self-pay | Admitting: Neurology

## 2022-08-16 VITALS — BP 136/89 | HR 100 | Ht 63.0 in | Wt 79.2 lb

## 2022-08-16 DIAGNOSIS — G629 Polyneuropathy, unspecified: Secondary | ICD-10-CM | POA: Diagnosis not present

## 2022-08-16 DIAGNOSIS — R42 Dizziness and giddiness: Secondary | ICD-10-CM | POA: Diagnosis not present

## 2022-08-16 DIAGNOSIS — I872 Venous insufficiency (chronic) (peripheral): Secondary | ICD-10-CM | POA: Diagnosis not present

## 2022-08-16 MED ORDER — PENTOXIFYLLINE ER 400 MG PO TBCR: EXTENDED_RELEASE_TABLET | ORAL | 6 refills | Status: DC

## 2022-08-16 NOTE — Patient Instructions (Addendum)
Good to see you.  Try the Trental '400mg'$ : take 1 tablet at night with meal.   2. Referral will be sent for Vestibular therapy  3. Use walker more frequently  4. Increase water intake  5. Follow-up in 6 months, call for any changes

## 2022-08-16 NOTE — Progress Notes (Signed)
NEUROLOGY FOLLOW UP OFFICE NOTE  [name] [id number] Dec 25, 1940  HISTORY OF PRESENT ILLNESS: I had the pleasure of seeing [name] in follow-up in the neurology clinic on 08/16/2022.  The patient was last seen on 9 months ago for neuropathy. She is alone in the office today. Records and images were personally reviewed where available. Since her last visit, she called our office to report an increase in falls. She has been dizzy, with a spinning sensation, but also feels lightheaded and balance is not good. She fell at [employer] 3 weeks ago, she was going to go up a step and her legs were rubber, she fell back. A few days later, she was at home holding a cup of tea when her left foot got stuck. She then could not move her right foot and fell on her face, injuring her nose. She crawled to the bed. She had spilled hot tea on her right hand. She has significant chronic venous insufficiency and on last visit with Dr. Hartford Poli at [facility], was prescribed Trental '400mg'$  2-3 times daily to improve venous circulation. She states she was not aware she had been prescribed medication. She has compression stockings. She has a cane and walker, hoping to get a lighterweight walker.    HPI: This is a pleasant 81 yo RH woman with a history of GERD, nutcracker esophagus, pulmonary fibrosis, in her usual state of health until early June 2017 while walking into a grocery she suddenly had left temporal pain, then it felt like she was hit on both side of the head with a hammer. She reports she was "hit like gangbusters," she was very dizzy with a spinning sensation, nauseated. She held on to the cart for balance and had difficulty concentrating. Her vision was blurred. She managed to finish her chore and walked to the car, then noticed that she was having a hard time talking. She could see the sentence broken up in her head and could not get the words out. She was able to drive home with no focal symptoms noted. The episode  lasted 30 minutes or so, she felt weak and tired after. Since then she has had at least 2 or 3 more episodes of headaches with dizziness, which did not progress to speech difficulties. The symptoms would last around 5 minutes, no clear positional component to the dizziness. When she feels it coming on, she massages her head, which seems to help. She was unsure if Tylenol helped. She denies any prior history of headaches. She denies any vision loss, photo/phonophobia, diplopia, dysarthria, jaw claudication, hearing loss. She has occasional pulsatile tinnitus more in the afternoons. She has GERD which makes swallowing difficult. She has a history of neuropathy affecting both feet, R>L, with burning and numbness/tingling for several years. She has been doing laser therapy for venous insufficiency/varicose veins. She denies any falls but trips more. Sleep varies. She is very active and rides her bike for a couple of hours daily. She works at a preschool. Her mother had a stroke at age 81.   Diagnostic Data:  Venous dopplers showed chronic venous insufficiency, duplex showed limited valvular insufficiency.    EMG/NCV of the right UE and LE confirmed moderate chronic sensorimotor axonal polyneuropathy. There was also note of moderate right ulnar neuropathy with slowing across the elbow and mild right median neuropathy consistent with carpal tunnel syndrome.   EMG at [facility] showing mild sensory neuropathy and negative punch biopsy for small fiber neuropathy, no  evidence of vasculitis.  She has also been evaluated by Rhuematology for Reynauds disease and by Vascular at [facility] last June 2021. Records were reviewed, she has severe venous insufficiency, mostly in the deep veins. It was discussed how this contributes to majority of her symptoms, including neuropathy. There were severe skin changes compatible with stasis dermatitis and venous stasis. She was prescribed knee high compression stockings.  PAST  MEDICAL HISTORY: Past Medical History:  Diagnosis Date   Allergy    Arthritis    Atherosclerosis of aorta ([place]) 2019   ct   BOOP (bronchiolitis obliterans with organizing pneumonia) ([place]) 2003   Bronchiectasis    Cataract    Esophageal dysmotility    Esophageal ulcer    Gastroparesis    GERD (gastroesophageal reflux disease)    severe   Headache    migraines   Hiatal hernia    neuropathy right foot and leg   Iron deficiency anemia    Macular degeneration (senile) of retina    Neuropathy    Nutcracker esophagus    Osteopenia    Pneumonia    Pulmonary fibrosis ([place])    Raynaud's disease    Recurrent pneumonia    Due to aspiration   Thrombocytosis     MEDICATIONS: Current Outpatient Medications on File Prior to Visit  Medication Sig Dispense Refill   acetaminophen (TYLENOL) 500 MG tablet Take 500 mg by mouth daily as needed (pain).     benzonatate (TESSALON) 200 MG capsule TAKE 1 CAPSULE AS NEEDED FOR COUGH. 90 capsule 3   Calcium-Vitamin D-Vitamin K (VIACTIV CALCIUM PLUS D) 650-12.5-40 MG-MCG-MCG CHEW Chew 1 each by mouth daily.     dexlansoprazole (DEXILANT) 60 MG capsule Take 60 mg by mouth daily.     famotidine (PEPCID) 20 MG tablet Take 1 tablet (20 mg total) by mouth 2 (two) times daily. 180 tablet 3   guaiFENesin (MUCINEX) 600 MG 12 hr tablet Take 600 mg by mouth 2 (two) times daily as needed for to loosen phlegm or cough.     omeprazole (PRILOSEC) 40 MG capsule TAKE ONE CAPSULE BY MOUTH TWICE DAILY 60 capsule 2   sucralfate (CARAFATE) 1 g tablet Take 1 tablet (1 g total) by mouth every 6 (six) hours as needed. Slowly dissolve 1 tablet in 1 Tbs of water before ingesting 60 tablet 3   No current facility-administered medications on file prior to visit.    ALLERGIES: Allergies  Allergen Reactions   Prednisone     Does not remember reaction    Sulfa Antibiotics     Does not remember reaction    Cortisone Other (See Comments)    Does not remember reaction     Sulfonamide Derivatives Other (See Comments)    Does not remember reaction     FAMILY HISTORY: Family History  Problem Relation Age of Onset   Lymphoma Mother    Heart disease Father    Lung cancer Paternal Uncle    Heart disease Paternal Grandfather    Leukemia Paternal Grandmother    Cancer Maternal Grandmother        unknown type   Colon cancer Neg Hx    Esophageal cancer Neg Hx    Rectal cancer Neg Hx    Stomach cancer Neg Hx     SOCIAL HISTORY: Social History   Socioeconomic History   Marital status: Widowed    Spouse name: Not on file   Number of children: 2   Years of education: Not on  file   Highest education level: Not on file  Occupational History   Occupation: retired Pharmacist, hospital    Comment: still teaching pre K   Tobacco Use   Smoking status: Former    Packs/day: 0.25    Years: 10.00    Total pack years: 2.50    Types: Cigarettes    Quit date: 11/15/1968    Years since quitting: 53.7   Smokeless tobacco: Never  Vaping Use   Vaping Use: Never used  Substance and Sexual Activity   Alcohol use: No   Drug use: No   Sexual activity: Not Currently    Partners: Male  Other Topics Concern   Not on file  Social History Narrative   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   Social Determinants of Health   Financial Resource Strain: Not on file  Food Insecurity: Not on file  Transportation Needs: Not on file  Physical Activity: Not on file  Stress: Not on file  Social Connections: Not on file  Intimate Partner Violence: Not on file     PHYSICAL EXAM: Vitals:   08/16/22 1534  BP: 136/89  Pulse: 100  SpO2: 100%   General: No acute distress Head:  Normocephalic/atraumatic Skin/Extremities: purplish discoloration on dorsum of both hands, both feet Neurological Exam: alert and awake. No aphasia or dysarthria. Fund of knowledge is  appropriate.  Attention and concentration are normal.   Cranial nerves: Pupils equal, round. Extraocular movements intact with no nystagmus. Visual fields full.  No facial asymmetry.  Motor: Bulk and tone normal, muscle strength 5/5 on both UE, 4/5 right hip flexion, otherwise 5/5.  Finger to nose testing intact.  Gait slow and cautious favoring right leg. No ataxia   IMPRESSION: This is a pleasant 81yo RH woman with a history of GERD, nutcracker esophagus, pulmonary fibrosis, initially seen for transient episode of headache, vertigo, and expressive aphasia, MRI brain no acute changes. Her MRA head and neck was unremarkable except for moderate stenosis of the left P1 and diminished distal flow-related signal within the left PCA distribution. No further episodes since June 2017, continue daily aspirin and control of vascular risk factors. She presents today reporting an increase in falls, she reports dizziness with a combination of imbalance from neuropathy, lightheadedness, and vertigo. Discussed Vestibular therapy for the vertigo. She was advised to start using walker more frequently for balance, she has very low dose gabapentin for neuropathic pain. She was prescribed Trental by her Vascular surgeon but was unaware of this. We discussed that it may help improve venous circulation, discussed side effects, she is agreeable to try Trental '400mg'$  at night with food. Follow-up in 6 months, call for any changes.    Thank you for allowing me to participate in her care.  Please do not hesitate to call for any questions or concerns.    KEllouise Newer M.D.   CC: Dr. SFelipa Eth

## 2022-08-23 ENCOUNTER — Telehealth: Payer: Self-pay | Admitting: Neurology

## 2022-08-23 DIAGNOSIS — G629 Polyneuropathy, unspecified: Secondary | ICD-10-CM

## 2022-08-23 NOTE — Telephone Encounter (Signed)
Ok to refer to physical therapy for leg weakness and balance therapy, dx neuropathy. thanks

## 2022-08-23 NOTE — Telephone Encounter (Signed)
Pt called no answer when she calls back we need to let her know that referral was placed for there physical therapy and they will call her to get her scheduled,

## 2022-08-23 NOTE — Addendum Note (Signed)
Addended by: Jake Seats on: 08/23/2022 02:33 PM   Modules accepted: Orders

## 2022-08-23 NOTE — Telephone Encounter (Signed)
Pt called in stating she is wondering if she can get a referral for PT for her weak legs?

## 2022-08-24 ENCOUNTER — Encounter: Payer: Self-pay | Admitting: Rehabilitative and Restorative Service Providers"

## 2022-08-24 ENCOUNTER — Other Ambulatory Visit: Payer: Self-pay

## 2022-08-24 ENCOUNTER — Encounter: Payer: Medicare HMO | Admitting: Rehabilitative and Restorative Service Providers"

## 2022-08-24 ENCOUNTER — Ambulatory Visit: Payer: Medicare HMO | Attending: Neurology | Admitting: Rehabilitative and Restorative Service Providers"

## 2022-08-24 DIAGNOSIS — G629 Polyneuropathy, unspecified: Secondary | ICD-10-CM | POA: Diagnosis not present

## 2022-08-24 DIAGNOSIS — I872 Venous insufficiency (chronic) (peripheral): Secondary | ICD-10-CM | POA: Insufficient documentation

## 2022-08-24 DIAGNOSIS — R2681 Unsteadiness on feet: Secondary | ICD-10-CM | POA: Insufficient documentation

## 2022-08-24 DIAGNOSIS — R42 Dizziness and giddiness: Secondary | ICD-10-CM | POA: Diagnosis not present

## 2022-08-24 NOTE — Telephone Encounter (Signed)
Pt called an informed that referral was placed for there physical therapy and they will call her to get her scheduled

## 2022-08-24 NOTE — Therapy (Signed)
OUTPATIENT PHYSICAL THERAPY VESTIBULAR EVALUATION     Patient Name: [name] MRN: [id number] DOB:1941/04/27, 81 y.o., female Today's Date: 08/24/2022  PCP: [name], MD REFERRING PROVIDER: Ellouise Newer, MD   PT End of Session - 08/24/22 1411     Visit Number 1    Number of Visits 16    Date for PT Re-Evaluation 10/23/22    Authorization Type aetna medicare    PT Start Time 1407    PT Stop Time 1448    PT Time Calculation (min) 41 min    Activity Tolerance Patient tolerated treatment well    Behavior During Therapy [facility] for tasks assessed/performed             Past Medical History:  Diagnosis Date   Allergy    Arthritis    Atherosclerosis of aorta ([place]) 2019   ct   BOOP (bronchiolitis obliterans with organizing pneumonia) ([place]) 2003   Bronchiectasis    Cataract    Esophageal dysmotility    Esophageal ulcer    Gastroparesis    GERD (gastroesophageal reflux disease)    severe   Headache    migraines   Hiatal hernia    neuropathy right foot and leg   Iron deficiency anemia    Macular degeneration (senile) of retina    Neuropathy    Nutcracker esophagus    Osteopenia    Pneumonia    Pulmonary fibrosis ([place])    Raynaud's disease    Recurrent pneumonia    Due to aspiration   Thrombocytosis    Past Surgical History:  Procedure Laterality Date   74 HOUR [place] STUDY N/A 10/13/2015   Procedure: 24 HOUR [place] STUDY;  Surgeon: Gatha Mayer, MD;  Location: [facility];  Service: Endoscopy;  Laterality: N/A;   BREAST LUMPECTOMY     left, x 2   CATARACT EXTRACTION     both eyes   CHOLECYSTECTOMY  2005   COLONOSCOPY     ESOPHAGEAL MANOMETRY N/A 10/06/2015   Procedure: ESOPHAGEAL MANOMETRY (EM);  Surgeon: Manus Gunning, MD;  Location: [facility];  Service: Gastroenterology;  Laterality: N/A;   ESOPHAGEAL MANOMETRY N/A 03/10/2018   Procedure: ESOPHAGEAL MANOMETRY (EM);  Surgeon: Mauri Pole, MD;  Location: [facility];  Service:  Endoscopy;  Laterality: N/A;   ESOPHAGEAL MANOMETRY N/A 10/15/2020   Procedure: ESOPHAGEAL MANOMETRY (EM);  Surgeon: Lavena Bullion, DO;  Location: [facility];  Service: Gastroenterology;  Laterality: N/A;   ESOPHAGOGASTRODUODENOSCOPY (EGD) WITH PROPOFOL N/A 08/02/2022   Procedure: ESOPHAGOGASTRODUODENOSCOPY (EGD) WITH PROPOFOL;  Surgeon: Yetta Flock, MD;  Location: [facility];  Service: Gastroenterology;  Laterality: N/A;   FOREIGN BODY REMOVAL  08/02/2022   Procedure: FOREIGN BODY REMOVAL;  Surgeon: Yetta Flock, MD;  Location: [facility];  Service: Gastroenterology;;   LUNG BIOPSY     vats right 2003   SAVORY DILATION N/A 08/02/2022   Procedure: SAVORY DILATION;  Surgeon: Yetta Flock, MD;  Location: [facility];  Service: Gastroenterology;  Laterality: N/A;   TOTAL ABDOMINAL HYSTERECTOMY     UPPER GASTROINTESTINAL ENDOSCOPY     Patient Active Problem List   Diagnosis Date Noted   Bilateral dry eyes 01/07/2022   Pain in both lower extremities 12/23/2021   Purple toe syndrome of both feet ([place]) 12/23/2021   Iron deficiency anemia 09/25/2021   Senile purpura ([place]) 09/25/2021   Syndrome of inappropriate secretion of antidiuretic hormone ([place]) 09/25/2021   Macular degeneration 03/18/2021   Choroidal nevus, left eye  01/01/2021   Early stage nonexudative age-related macular degeneration of both eyes 01/01/2021   Thrombocytosis 12/08/2020   Bronchiolectasis ([place]) 11/27/2020   Hardening of the aorta (main artery of the heart) ([place]) 11/27/2020   Idiopathic progressive polyneuropathy 11/27/2020   Intestinal malabsorption 11/27/2020   Malnutrition of mild degree Altamease Oiler: 75% to less than 90% of standard weight) ([place]) 11/27/2020   Pulmonary fibrosis ([place]) 11/27/2020   Urge incontinence of urine 11/27/2020   Protein calorie malnutrition ([place]) 11/24/2020   Left epiretinal membrane 04/01/2020   Posterior vitreous detachment of both eyes 04/01/2020   Burning  sensation of feet 02/17/2020   Venous insufficiency 01/02/2019   Carpal tunnel syndrome of right wrist 11/24/2018   Sore throat 12/08/2017   Hoarseness 12/08/2017   Abnormal CT scan 12/06/2017   Laryngopharyngeal reflux (LPR) 10/03/2017   Temporomandibular jaw dysfunction 10/03/2017   Tension-type headache, not intractable 09/12/2017   Abdominal pain 04/28/2017   Other fatigue 04/28/2017   Pneumonia 12/14/2016   Neuropathy 07/30/2016   TIA (transient ischemic attack) 06/22/2016   New onset of headaches after age 46 06/22/2016   Dysphagia    Allergic reaction 01/11/2015   Arthritis of right ankle 10/09/2013   Pes cavus 10/09/2013   Loss of transverse plantar arch 10/09/2013   Allergic rhinitis 05/14/2013   BRONCHIECTASIS 12/15/2010   Nutcracker esophagus 01/26/2008   GERD 01/26/2008   GASTROPARESIS 01/26/2008    ONSET DATE: 08/16/2022  REFERRING DIAG: vertigo  THERAPY DIAG:  Dizziness and giddiness  Unsteadiness on feet  Rationale for Evaluation and Treatment Rehabilitation  SUBJECTIVE:   SUBJECTIVE STATEMENT: The patient reports beginning with dizziness and vertigo during COVID.  She reports worsening weakness in her LEs as well.  She had a fall about a month ago when attempting to step up a curb.  There are 2 referrals in Epic:  1 for vestibular rehab and 1 for neuropathy and strengthening.  She typically has one meal/day and it is her evening meal.   She has significant chronic venous insufficiency and on last visit with Dr. Hartford Poli at [facility], was prescribed Trental '400mg'$  2-3 times daily to improve venous circulation. She states she was not aware she had been prescribed medication. She has compression stockings. She has a cane and walker, hoping to get a lighterweight walker.   Pt accompanied by: self  PERTINENT HISTORY: h/o neuropathy, h/o chronic venous insufficiency.     PAIN:  Are you having pain? No and tense," my body is sore because I'm  tense"  PRECAUTIONS: Fall  WEIGHT BEARING RESTRICTIONS No  FALLS: Has patient fallen in last 6 months? Yes. Number of falls 2  major falls with frequent unsteadiness and hold onto surrounding support  LIVING ENVIRONMENT: Lives with: lives with their family and lives alone Lives in: House/apartment Stairs: No Has following equipment at home: Gilford Rile - 4 wheeled and cane  PLOF: Independent and noting declining strength.  This has worsened in the past 6-7 months.   PATIENT GOALS Get my head straightened out, get stronger.  OBJECTIVE:   DIAGNOSTIC FINDINGS: EMG at [facility] showing mild sensory neuropathy and negative punch biopsy for small fiber neuropathy, no evidence of vasculitis  COGNITION: Overall cognitive status: Within functional limits for tasks assessed   SENSATION: Light touch: Impaired  prickly sensation in her legs with light touch  EDEMA: Note mild edema in feet  POSTURE: rounded shoulders and forward head   Cervical ROM:  WFLs  STRENGTH: Weakness noted with rising from a  chair.  *TBA further  LOWER EXTREMITY MMT:   MMT Right eval Left eval  Hip flexion TBA TBA  Hip abduction    Hip adduction    Hip internal rotation    Hip external rotation    Knee flexion    Knee extension    Ankle dorsiflexion    Ankle plantarflexion    Ankle inversion    Ankle eversion    (Blank rows = not tested)  BED MOBILITY:  Able to move sit<>supine, but does require increased time, and provokes dizziness  TRANSFERS: Sit to stand: Modified independence  requires several attempts Stand to sit: Modified independence  GAIT: Gait pattern: decreased stride length Distance walked: 100 ft  Assistive device utilized: Single point cane Level of assistance: Modified independence  FUNCTIONAL TESTs:  5 times sit to stand: Unable to perform 5 times. *note a weak sounding cough that is not effective to clear mucous-- may add some inspiratory/expiratory breathing to  HEP  PATIENT SURVEYS:  FOTO not done due to 2 referrals for different conditions   VESTIBULAR ASSESSMENT   GENERAL OBSERVATION: The patient has difficulty rising to stand from a chair in the waiting room.  She walks into the clinic with a SPC mod independently.     SYMPTOM BEHAVIOR:   Subjective history: h/o multi-year sensation of spinning and imbalance "scared to get up quickly."   Non-Vestibular symptoms:  weakness   Type of dizziness: Spinning/Vertigo, Unsteady with head/body turns, and Lightheadedness/Faint   Frequency: daily    Duration: constant   Aggravating factors: Spontaneous, Induced by position change: sit to stand, Worse in the morning, and Occurs when standing still    Relieving factors: no known relieving factors   Progression of symptoms: worse   OCULOMOTOR EXAM:   Ocular Alignment: normal   Ocular ROM: No Limitations   Spontaneous Nystagmus: absent   Gaze-Induced Nystagmus: absent   Smooth Pursuits: intact   Saccades: intact   Lenswear:  progressive lenses worn at all times when up   VESTIBULAR - OCULAR REFLEX:    Slow VOR: Comment: provokes dizziness with slow VOR after providing demo and tactile cues for speed.  Provokes 8/10 symptoms.   VOR Cancellation: Normal   Head-Impulse Test: HIT Right: positive HIT Left:  negative for corrective saccade.   Dynamic Visual Acuity:  TBA    POSITIONAL TESTING: Worse when rising from sitting or supine.  Describes a sensation of 'weakness".  She does not get spinning with rolling or moving sit>sidelying > supine.    Sit>Sidelying:  R is positive for spinning dizziness x 1-2 minutes, no nystagmus viewed.  To L provokes a sensation of spinning dizziness x 30 seconds.  "My  head is spinning, but the room stays still".  Returning to sitting provokes a sensation of spinning and lightheadedness.   MOTION SENSITIVITY:    Motion Sensitivity Quotient  Intensity: 0 = none, 1 = Lightheaded, 2 = Mild, 3 = Moderate, 4 = Severe, 5 =  Vomiting  Intensity  1. Sitting to supine   2. Supine to L side   3. Supine to R side   4. Supine to sitting   5. L Hallpike-Dix   6. Up from L    7. R Hallpike-Dix   8. Up from R    9. Sitting, head  tipped to L knee   10. Head up from L  knee   11. Sitting, head  tipped to R knee   12. Head up from R  knee   13. Sitting head turns x5 4  14.Sitting head nods x5 4 "that's when I'd fall"  15. In stance, 180  turn to L    16. In stance, 180  turn to R     OTHOSTATICS: TBA  FUNCTIONAL GAIT:  5 time sit to stand:  unable to perform.   VESTIBULAR TREATMENT:  Gaze Adaptation:   x1 Viewing Horizontal: Position: seated, Reps: 5, and Comment: provokes dizziness to 8/10  PATIENT EDUCATION: Education details: patient Person educated: Patient Education method: Explanation, Demonstration, and Handouts Education comprehension: verbalized understanding and returned demonstration   GOALS: Goals reviewed with patient? Yes  SHORT TERM GOALS: Target date: 09/21/2022   The patient will be indep with HEP. Baseline:no HEP Goal status: INITIAL  2.  The patient will tolerate seated head turns x 5 reps with dizziness < or equal to 3/10 Baseline: 8/10 Goal status: INITIAL  3.  The patient will move sit<>bilateral sidelying with dizziness < or equal to 3/10. Baseline: 8/10 Goal status: INITIAL  4.  The patient will be further assessed on MMT, Berg, and TUG. Baseline: focused on vertigo at eval. Goal status: INITIAL  LONG TERM GOALS: Target date: 10/19/2022    Indep with progression of HEP. Baseline: no HEP Goal status: INITIAL  2.  The patient will improve Berg score by 5 points. Baseline: TBA 2nd visit. Goal status: INITIAL  3.  The patient will tolerate gaze x 1 viewing x 30 seconds. Baseline: Unable to tolerate 5 reps Goal status: INITIAL  4.  The patient will improve TUG by 3 seconds Baseline: TBA 2nd visit Goal status: INITIAL  5.  The patient will move  sit<>stand with UE support 5 times. Baseline:  Unable to rise first attempt, requires multiple attempts. Goal status: INITIAL  ASSESSMENT:  CLINICAL IMPRESSION: Patient is a 80 y.o. female who was seen today for physical therapy evaluation and treatment for vertigo.  She also has a referral in epic for strengthening due to neuropathy.   We discussed this and she will continue this plan of care for both referrals.  PT focused on vertigo today as this was her most prevalent complaint. Pt has + head impulse testing indicating dec'd use of VOR, and dec'd tolerance to all motion.  PT to address with habituation and gaze activities, and then begin adding balance and strengthening to plan of care. I anticipate multiple factors contributing including hypofunction, motion sensitivity, and potentially dietary considerations (1 meal/day and heavy exercise riding 20 miles on bike each morning).  OBJECTIVE IMPAIRMENTS Abnormal gait, decreased activity tolerance, decreased balance, decreased mobility, decreased strength, dizziness, impaired flexibility, and postural dysfunction.   ACTIVITY LIMITATIONS bending, standing, squatting, stairs, transfers, bed mobility, and locomotion level  PARTICIPATION LIMITATIONS: meal prep, cleaning, interpersonal relationship, and community activity  PERSONAL FACTORS 1-2 comorbidities: neuropathy, h/o vertigo  are also affecting patient's functional outcome.   REHAB POTENTIAL: Good  CLINICAL DECISION MAKING: Stable/uncomplicated  EVALUATION COMPLEXITY: Low   PLAN: PT FREQUENCY: 2x/week  PT DURATION: 8 weeks  PLANNED INTERVENTIONS: Therapeutic exercises, Therapeutic activity, Neuromuscular re-education, Balance training, Gait training, Patient/Family education, Self Care, Joint mobilization, Vestibular training, Canalith repositioning, and Manual therapy  PLAN FOR NEXT SESSION: add habituation HEP, retest positional vertigo, BErg, MMT, and TUG.  Further develop HEP  adding vestibular and strengthening activities.   [place], PT 08/24/2022, 2:17 PM

## 2022-08-31 ENCOUNTER — Encounter: Payer: Self-pay | Admitting: Podiatry

## 2022-09-01 ENCOUNTER — Encounter: Payer: Self-pay | Admitting: Physical Therapy

## 2022-09-01 ENCOUNTER — Ambulatory Visit: Payer: Medicare HMO | Admitting: Physical Therapy

## 2022-09-01 DIAGNOSIS — R42 Dizziness and giddiness: Secondary | ICD-10-CM

## 2022-09-01 DIAGNOSIS — I872 Venous insufficiency (chronic) (peripheral): Secondary | ICD-10-CM | POA: Diagnosis not present

## 2022-09-01 DIAGNOSIS — R2681 Unsteadiness on feet: Secondary | ICD-10-CM

## 2022-09-01 DIAGNOSIS — G629 Polyneuropathy, unspecified: Secondary | ICD-10-CM | POA: Diagnosis not present

## 2022-09-01 NOTE — Therapy (Signed)
OUTPATIENT PHYSICAL THERAPY VESTIBULAR TREATMENT NOTE     Patient Name: [name] MRN: [id number] DOB:Aug 19, 1941, 81 y.o., female Today's Date: 09/01/2022  PCP: [name], MD REFERRING PROVIDER: Ellouise Newer, MD   PT End of Session - 09/01/22 1543     Visit Number 2    Number of Visits 16    Date for PT Re-Evaluation 10/23/22    Authorization Type aetna medicare    PT Start Time 1540   in restroom at beginning of session; has to leave for appointment   PT Stop Time 1612    PT Time Calculation (min) 32 min    Activity Tolerance Patient tolerated treatment well    Behavior During Therapy [facility] for tasks assessed/performed              Past Medical History:  Diagnosis Date   Allergy    Arthritis    Atherosclerosis of aorta ([place]) 2019   ct   BOOP (bronchiolitis obliterans with organizing pneumonia) ([place]) 2003   Bronchiectasis    Cataract    Esophageal dysmotility    Esophageal ulcer    Gastroparesis    GERD (gastroesophageal reflux disease)    severe   Headache    migraines   Hiatal hernia    neuropathy right foot and leg   Iron deficiency anemia    Macular degeneration (senile) of retina    Neuropathy    Nutcracker esophagus    Osteopenia    Pneumonia    Pulmonary fibrosis ([place])    Raynaud's disease    Recurrent pneumonia    Due to aspiration   Thrombocytosis    Past Surgical History:  Procedure Laterality Date   61 HOUR [place] STUDY N/A 10/13/2015   Procedure: 24 HOUR [place] STUDY;  Surgeon: Gatha Mayer, MD;  Location: [facility];  Service: Endoscopy;  Laterality: N/A;   BREAST LUMPECTOMY     left, x 2   CATARACT EXTRACTION     both eyes   CHOLECYSTECTOMY  2005   COLONOSCOPY     ESOPHAGEAL MANOMETRY N/A 10/06/2015   Procedure: ESOPHAGEAL MANOMETRY (EM);  Surgeon: Manus Gunning, MD;  Location: [facility];  Service: Gastroenterology;  Laterality: N/A;   ESOPHAGEAL MANOMETRY N/A 03/10/2018   Procedure: ESOPHAGEAL MANOMETRY (EM);   Surgeon: Mauri Pole, MD;  Location: [facility];  Service: Endoscopy;  Laterality: N/A;   ESOPHAGEAL MANOMETRY N/A 10/15/2020   Procedure: ESOPHAGEAL MANOMETRY (EM);  Surgeon: Lavena Bullion, DO;  Location: [facility];  Service: Gastroenterology;  Laterality: N/A;   ESOPHAGOGASTRODUODENOSCOPY (EGD) WITH PROPOFOL N/A 08/02/2022   Procedure: ESOPHAGOGASTRODUODENOSCOPY (EGD) WITH PROPOFOL;  Surgeon: Yetta Flock, MD;  Location: [facility];  Service: Gastroenterology;  Laterality: N/A;   FOREIGN BODY REMOVAL  08/02/2022   Procedure: FOREIGN BODY REMOVAL;  Surgeon: Yetta Flock, MD;  Location: [facility];  Service: Gastroenterology;;   LUNG BIOPSY     vats right 2003   SAVORY DILATION N/A 08/02/2022   Procedure: SAVORY DILATION;  Surgeon: Yetta Flock, MD;  Location: [facility];  Service: Gastroenterology;  Laterality: N/A;   TOTAL ABDOMINAL HYSTERECTOMY     UPPER GASTROINTESTINAL ENDOSCOPY     Patient Active Problem List   Diagnosis Date Noted   Bilateral dry eyes 01/07/2022   Pain in both lower extremities 12/23/2021   Purple toe syndrome of both feet ([place]) 12/23/2021   Iron deficiency anemia 09/25/2021   Senile purpura ([place]) 09/25/2021   Syndrome of inappropriate secretion of antidiuretic  hormone ([place]) 09/25/2021   Macular degeneration 03/18/2021   Choroidal nevus, left eye 01/01/2021   Early stage nonexudative age-related macular degeneration of both eyes 01/01/2021   Thrombocytosis 12/08/2020   Bronchiolectasis ([place]) 11/27/2020   Hardening of the aorta (main artery of the heart) ([place]) 11/27/2020   Idiopathic progressive polyneuropathy 11/27/2020   Intestinal malabsorption 11/27/2020   Malnutrition of mild degree Altamease Oiler: 75% to less than 90% of standard weight) ([place]) 11/27/2020   Pulmonary fibrosis ([place]) 11/27/2020   Urge incontinence of urine 11/27/2020   Protein calorie malnutrition ([place]) 11/24/2020   Left epiretinal membrane 04/01/2020    Posterior vitreous detachment of both eyes 04/01/2020   Burning sensation of feet 02/17/2020   Venous insufficiency 01/02/2019   Carpal tunnel syndrome of right wrist 11/24/2018   Sore throat 12/08/2017   Hoarseness 12/08/2017   Abnormal CT scan 12/06/2017   Laryngopharyngeal reflux (LPR) 10/03/2017   Temporomandibular jaw dysfunction 10/03/2017   Tension-type headache, not intractable 09/12/2017   Abdominal pain 04/28/2017   Other fatigue 04/28/2017   Pneumonia 12/14/2016   Neuropathy 07/30/2016   TIA (transient ischemic attack) 06/22/2016   New onset of headaches after age 30 06/22/2016   Dysphagia    Allergic reaction 01/11/2015   Arthritis of right ankle 10/09/2013   Pes cavus 10/09/2013   Loss of transverse plantar arch 10/09/2013   Allergic rhinitis 05/14/2013   BRONCHIECTASIS 12/15/2010   Nutcracker esophagus 01/26/2008   GERD 01/26/2008   GASTROPARESIS 01/26/2008    ONSET DATE: 08/16/2022  REFERRING DIAG: vertigo  THERAPY DIAG:  Dizziness and giddiness  Unsteadiness on feet  Rationale for Evaluation and Treatment Rehabilitation  SUBJECTIVE:   SUBJECTIVE STATEMENT: Feel very tired today. Didn't sleep well last night.  No falls.   Don't feel steady with my balance.  No room spinning dizziness. Pt accompanied by: self  PERTINENT HISTORY: h/o neuropathy, h/o chronic venous insufficiency.     PAIN:  Are you having pain? No and tense," my body is sore because I'm tense"  PRECAUTIONS: Fall  WEIGHT BEARING RESTRICTIONS No  FALLS: Has patient fallen in last 6 months? Yes. Number of falls 2  major falls with frequent unsteadiness and hold onto surrounding support  LIVING ENVIRONMENT: Lives with: lives with their family and lives alone Lives in: House/apartment Stairs: No Has following equipment at home: Gilford Rile - 4 wheeled and cane  PLOF: Independent and noting declining strength.  This has worsened in the past 6-7 months.   PATIENT GOALS Get my head  straightened out, get stronger.  OBJECTIVE:   TODAY'S TREATMENT: 09/01/2022 Neuro Re-education: Reviewed Horizontal gaze stabilization x 1, seated x 5 reps, no c/o dizziness.  Pt reports doing this at home multiple times daily with varied dizziness results.    At end of session, performed standing horizontal gaze stabilization UE support at cane, x 5 reps, no c/o dizziness.  She reports this makes her feel more stable.  Therapeutic Exercise:  MMT Right  Left   Hip flexion 3-/5 3+/5  Hip abduction 4 4  Hip adduction 3+ 3+  Hip internal rotation    Hip external rotation    Knee flexion 3+/5 3+/5  Knee extension 3-/5 3+/5  Ankle dorsiflexion 3 3  Ankle plantarflexion    Ankle inversion    Ankle eversion     Performed seated exercises for R lower extremity strength:   -LAQ x 5 reps, cues for eccentric control -seated ankle pumps x 10 reps, cues for 3 sec hold  Standing exercises: -Pt demo what she already does at home:   -standing hip abduction (pt performs very rapidly)-PT provides cues for slowed pace, brief hold for increased muscle strength -Standing heel toe raises x 5 reps -standing gastroc stretch  -Also had pt perform stagger stance forward/back rocking for dynamic stretch, strengthening of ankle musculature   Access Code: [email or web address] URL: [email or web address] Date: 09/01/2022 Prepared by: [facility]  Exercises - Seated Toe Raise  - 1 x daily - 7 x weekly - 1-2 sets - 10 reps - 3 sec hold - Seated Long Arc Quad  - 1 x daily - 7 x weekly - 1-2 sets - 10 reps - 3 sec hold - Staggered Stance Forward Backward Weight Shift with Counter Support  - 1 x daily - 7 x weekly - 1-2 sets - 10 reps  PATIENT EDUCATION: Education details: HEP additions; continue seated gaze stabilization Person educated: Patient Education method: Explanation, Demonstration, and Handouts Education comprehension: verbalized understanding,  returned demonstration, and needs further education   ---------------------------------------------------------------------------------- Objective measures below taken at time of eval: DIAGNOSTIC FINDINGS: EMG at [facility] showing mild sensory neuropathy and negative punch biopsy for small fiber neuropathy, no evidence of vasculitis  COGNITION: Overall cognitive status: Within functional limits for tasks assessed   SENSATION: Light touch: Impaired  prickly sensation in her legs with light touch  EDEMA: Note mild edema in feet  POSTURE: rounded shoulders and forward head   Cervical ROM:  WFLs  STRENGTH: Weakness noted with rising from a chair.  *TBA further  LOWER EXTREMITY MMT:   MMT Right eval Left eval  Hip flexion TBA TBA  Hip abduction    Hip adduction    Hip internal rotation    Hip external rotation    Knee flexion    Knee extension    Ankle dorsiflexion    Ankle plantarflexion    Ankle inversion    Ankle eversion    (Blank rows = not tested)  BED MOBILITY:  Able to move sit<>supine, but does require increased time, and provokes dizziness  TRANSFERS: Sit to stand: Modified independence  requires several attempts Stand to sit: Modified independence  GAIT: Gait pattern: decreased stride length Distance walked: 100 ft  Assistive device utilized: Single point cane Level of assistance: Modified independence  FUNCTIONAL TESTs:  5 times sit to stand: Unable to perform 5 times. *note a weak sounding cough that is not effective to clear mucous-- may add some inspiratory/expiratory breathing to HEP  PATIENT SURVEYS:  FOTO not done due to 2 referrals for different conditions   VESTIBULAR ASSESSMENT   GENERAL OBSERVATION: The patient has difficulty rising to stand from a chair in the waiting room.  She walks into the clinic with a SPC mod independently.     SYMPTOM BEHAVIOR:   Subjective history: h/o multi-year sensation of spinning and imbalance "scared  to get up quickly."   Non-Vestibular symptoms:  weakness   Type of dizziness: Spinning/Vertigo, Unsteady with head/body turns, and Lightheadedness/Faint   Frequency: daily    Duration: constant   Aggravating factors: Spontaneous, Induced by position change: sit to stand, Worse in the morning, and Occurs when standing still    Relieving factors: no known relieving factors   Progression of symptoms: worse   OCULOMOTOR EXAM:   Ocular Alignment: normal   Ocular ROM: No Limitations   Spontaneous Nystagmus: absent   Gaze-Induced Nystagmus: absent   Smooth Pursuits: intact   Saccades:  intact   Lenswear:  progressive lenses worn at all times when up   VESTIBULAR - OCULAR REFLEX:    Slow VOR: Comment: provokes dizziness with slow VOR after providing demo and tactile cues for speed.  Provokes 8/10 symptoms.   VOR Cancellation: Normal   Head-Impulse Test: HIT Right: positive HIT Left:  negative for corrective saccade.   Dynamic Visual Acuity:  TBA    POSITIONAL TESTING: Worse when rising from sitting or supine.  Describes a sensation of 'weakness".  She does not get spinning with rolling or moving sit>sidelying > supine.    Sit>Sidelying:  R is positive for spinning dizziness x 1-2 minutes, no nystagmus viewed.  To L provokes a sensation of spinning dizziness x 30 seconds.  "My  head is spinning, but the room stays still".  Returning to sitting provokes a sensation of spinning and lightheadedness.   MOTION SENSITIVITY:    Motion Sensitivity Quotient  Intensity: 0 = none, 1 = Lightheaded, 2 = Mild, 3 = Moderate, 4 = Severe, 5 = Vomiting  Intensity  1. Sitting to supine   2. Supine to L side   3. Supine to R side   4. Supine to sitting   5. L Hallpike-Dix   6. Up from L    7. R Hallpike-Dix   8. Up from R    9. Sitting, head  tipped to L knee   10. Head up from L  knee   11. Sitting, head  tipped to R knee   12. Head up from R  knee   13. Sitting head turns x5 4  14.Sitting head  nods x5 4 "that's when I'd fall"  15. In stance, 180  turn to L    16. In stance, 180  turn to R     OTHOSTATICS: TBA  FUNCTIONAL GAIT:  5 time sit to stand:  unable to perform.   VESTIBULAR TREATMENT:  Gaze Adaptation:   x1 Viewing Horizontal: Position: seated, Reps: 5, and Comment: provokes dizziness to 8/10  PATIENT EDUCATION: Education details: patient Person educated: Patient Education method: Explanation, Demonstration, and Handouts Education comprehension: verbalized understanding and returned demonstration   GOALS: Goals reviewed with patient? Yes  SHORT TERM GOALS: Target date: 09/21/2022   The patient will be indep with HEP. Baseline:no HEP Goal status: INITIAL  2.  The patient will tolerate seated head turns x 5 reps with dizziness < or equal to 3/10 Baseline: 8/10 Goal status: INITIAL  3.  The patient will move sit<>bilateral sidelying with dizziness < or equal to 3/10. Baseline: 8/10 Goal status: INITIAL  4.  The patient will be further assessed on MMT, Berg, and TUG. Baseline: focused on vertigo at eval. Goal status: INITIAL  LONG TERM GOALS: Target date: 10/19/2022    Indep with progression of HEP. Baseline: no HEP Goal status: INITIAL  2.  The patient will improve Berg score by 5 points. Baseline: TBA 2nd visit. Goal status: INITIAL  3.  The patient will tolerate gaze x 1 viewing x 30 seconds. Baseline: Unable to tolerate 5 reps Goal status: INITIAL  4.  The patient will improve TUG by 3 seconds Baseline: TBA 2nd visit Goal status: INITIAL  5.  The patient will move sit<>stand with UE support 5 times. Baseline:  Unable to rise first attempt, requires multiple attempts. Goal status: INITIAL  ASSESSMENT:  CLINICAL IMPRESSION: Pt returns to PT treatment session today with no c/o room spinning dizziness, but rather feeling more fatigued and  unsteady today.  Due to pt having another appointment after her PT session, she requests to hold  on positional vertigo testing today and instead focus on strengthening.  Assessed BLE strength with RLE noted weaker than LLE throughout.  Initiated HEP in sitting/standing for leg strengthening and cued patietn for optimal hold time and technique for exercises she is already performing in standing at home.  She will continue to benefit from skilled PT towards goals for improved overall functional mobility and decreased fall risk.  OBJECTIVE IMPAIRMENTS Abnormal gait, decreased activity tolerance, decreased balance, decreased mobility, decreased strength, dizziness, impaired flexibility, and postural dysfunction.   ACTIVITY LIMITATIONS bending, standing, squatting, stairs, transfers, bed mobility, and locomotion level  PARTICIPATION LIMITATIONS: meal prep, cleaning, interpersonal relationship, and community activity  PERSONAL FACTORS 1-2 comorbidities: neuropathy, h/o vertigo  are also affecting patient's functional outcome.   REHAB POTENTIAL: Good  CLINICAL DECISION MAKING: Stable/uncomplicated  EVALUATION COMPLEXITY: Low   PLAN: PT FREQUENCY: 2x/week  PT DURATION: 8 weeks  PLANNED INTERVENTIONS: Therapeutic exercises, Therapeutic activity, Neuromuscular re-education, Balance training, Gait training, Patient/Family education, Self Care, Joint mobilization, Vestibular training, Canalith repositioning, and Manual therapy  PLAN FOR NEXT SESSION: Review HEP given this visit.  Continue to work on adding habituation HEP, retest positional vertigo, BErg and TUG.  Further develop HEP adding vestibular and strengthening activities. *Pt only has one additional appt scheduled-so make sure to schedule more.   Frazier Butt., PT 09/01/2022, 4:28 PM  [facility] at [facility] [address], [place], [place] [address] Phone # [phone] Fax # [phone]

## 2022-09-02 ENCOUNTER — Ambulatory Visit: Payer: Medicare HMO | Admitting: Rehabilitative and Restorative Service Providers"

## 2022-09-07 ENCOUNTER — Ambulatory Visit: Payer: Medicare HMO | Admitting: Rehabilitative and Restorative Service Providers"

## 2022-09-08 ENCOUNTER — Ambulatory Visit: Payer: Medicare HMO | Admitting: Podiatry

## 2022-09-09 ENCOUNTER — Ambulatory Visit: Payer: Medicare HMO | Admitting: Physical Therapy

## 2022-09-10 ENCOUNTER — Encounter: Payer: Self-pay | Admitting: Podiatry

## 2022-09-10 ENCOUNTER — Ambulatory Visit (INDEPENDENT_AMBULATORY_CARE_PROVIDER_SITE_OTHER): Payer: Medicare HMO | Admitting: Podiatry

## 2022-09-10 DIAGNOSIS — Q828 Other specified congenital malformations of skin: Secondary | ICD-10-CM

## 2022-09-10 DIAGNOSIS — M79675 Pain in left toe(s): Secondary | ICD-10-CM | POA: Diagnosis not present

## 2022-09-10 DIAGNOSIS — G629 Polyneuropathy, unspecified: Secondary | ICD-10-CM | POA: Diagnosis not present

## 2022-09-10 DIAGNOSIS — B351 Tinea unguium: Secondary | ICD-10-CM | POA: Diagnosis not present

## 2022-09-10 DIAGNOSIS — M79674 Pain in right toe(s): Secondary | ICD-10-CM | POA: Diagnosis not present

## 2022-09-10 NOTE — Patient Instructions (Signed)
Oofos Oomg Eezee Low Shoes with stretchable uppers can be purchased online at: [email or web address].

## 2022-09-10 NOTE — Progress Notes (Unsigned)
  Subjective:  Patient ID: [name], female    DOB: 08-Dec-1940,  MRN: [id number]  [name] presents to clinic today for at risk foot care with h/o neuropathy. Chief Complaint  Patient presents with   Nail Problem    Thick painful toenails, 9 week  follow up   . New problem(s): None.   PCP is [name], MD , and last visit was  June 11, 2022.  Allergies  Allergen Reactions   Prednisone     Does not remember reaction    Sulfa Antibiotics     Does not remember reaction    Cortisone Other (See Comments)    Does not remember reaction    Sulfonamide Derivatives Other (See Comments)    Does not remember reaction     Review of Systems: Negative except as noted in the HPI.  Objective: No changes noted in today's physical examination.  [name] is a pleasant 81 y.o. female WD, WN in NAD. AAO x 3.  Neurovascular Examination: Neurovascular status intact bilateral lower extremities. Capillary refill time to digits immediate b/l. Palpable pedal pulses b/l LE. Pedal hair sparse. No pain with calf compression b/l. Lower extremity skin temperature gradient within normal limits. No edema noted b/l LE. No cyanosis or clubbing noted b/l LE.  Protective sensation decreased with 10 gram monofilament b/l.  Dermatological:  Pedal skin warm and supple b/l.  No open wounds b/l. No interdigital macerations. Toenails 1-5 b/l elongated, thickened, discolored with subungual debris. +Tenderness with dorsal palpation of nailplates.   Porokeratotic lesion(s) distal tip L 3rd toe  with tenderness to palpation. No edema, no erythema, no drainage, no fluctuance.   Musculoskeletal:  Normal muscle strength 5/5 to all lower extremity muscle groups bilaterally. Hammertoe deformity noted 2-5 b/l.[name] No pain, crepitus or joint limitation noted with ROM b/l LE.  Patient ambulates independently without assistive aids.  Assessment/Plan: 1. Pain due to onychomycosis of toenails of both feet   2.  Porokeratosis   3. Neuropathy     No orders of the defined types were placed in this encounter.   -Patient was evaluated and treated. All patient's and/or POA's questions/concerns answered on today's visit. -Toenails 1-5 b/l were debrided in length and girth with sterile nail nippers and dremel without iatrogenic bleeding.  -Porokeratotic lesion(s) distal tip of left 3rd toe pared and enucleated with sterile currette without incident. Total number of lesions debrided=1. -Patient/POA to call should there be question/concern in the interim.   Return in about 3 months (around 12/11/2022).  Marzetta Board, DPM

## 2022-09-13 DIAGNOSIS — Z1231 Encounter for screening mammogram for malignant neoplasm of breast: Secondary | ICD-10-CM | POA: Diagnosis not present

## 2022-09-22 ENCOUNTER — Encounter: Payer: Self-pay | Admitting: Physical Therapy

## 2022-09-22 ENCOUNTER — Ambulatory Visit: Payer: Medicare HMO | Attending: Neurology | Admitting: Physical Therapy

## 2022-09-22 DIAGNOSIS — R2681 Unsteadiness on feet: Secondary | ICD-10-CM | POA: Insufficient documentation

## 2022-09-22 DIAGNOSIS — R42 Dizziness and giddiness: Secondary | ICD-10-CM | POA: Diagnosis not present
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
## 2022-09-23 ENCOUNTER — Other Ambulatory Visit: Payer: Self-pay | Admitting: Diagnostic Radiology

## 2022-09-23 DIAGNOSIS — Z803 Family history of malignant neoplasm of breast: Secondary | ICD-10-CM

## 2022-09-23 DIAGNOSIS — R922 Inconclusive mammogram: Secondary | ICD-10-CM

## 2022-09-30 ENCOUNTER — Telehealth: Payer: Self-pay | Admitting: Adult Health

## 2022-09-30 ENCOUNTER — Ambulatory Visit (INDEPENDENT_AMBULATORY_CARE_PROVIDER_SITE_OTHER): Payer: Medicare HMO

## 2022-09-30 ENCOUNTER — Encounter: Payer: Self-pay | Admitting: Adult Health

## 2022-09-30 ENCOUNTER — Ambulatory Visit (INDEPENDENT_AMBULATORY_CARE_PROVIDER_SITE_OTHER): Payer: Medicare HMO | Admitting: Adult Health

## 2022-09-30 VITALS — BP 110/80 | HR 68 | Temp 97.5°F | Ht 64.0 in | Wt 78.8 lb

## 2022-09-30 DIAGNOSIS — J471 Bronchiectasis with (acute) exacerbation: Secondary | ICD-10-CM

## 2022-09-30 DIAGNOSIS — E43 Unspecified severe protein-calorie malnutrition: Secondary | ICD-10-CM

## 2022-09-30 DIAGNOSIS — J479 Bronchiectasis, uncomplicated: Secondary | ICD-10-CM | POA: Diagnosis not present

## 2022-09-30 DIAGNOSIS — J449 Chronic obstructive pulmonary disease, unspecified: Secondary | ICD-10-CM | POA: Diagnosis not present

## 2022-09-30 MED ORDER — AMOXICILLIN-POT CLAVULANATE 875-125 MG PO TABS: 1.0000 | ORAL_TABLET | Freq: Two times a day (BID) | ORAL | 0 refills | Status: DC

## 2022-09-30 MED ORDER — BENZONATATE 200 MG PO CAPS: 200.0000 mg | ORAL_CAPSULE | Freq: Three times a day (TID) | ORAL | 3 refills | Status: DC | PRN

## 2022-09-30 NOTE — Patient Instructions (Addendum)
Augmentin '875mg'$  Twice daily  For 1 week. ,take with food.  Delsym 2 tsp Twice daily  As needed   Tessalon Three times a day  As needed  cough  Sips of water to soothe throat.  Honey for cough As needed   Chest xray today (call patient )  Flutter valve if able .  High calorie/protein.  Follow up with Dr. Halford Chessman or Arwin Bisceglia NP In 6 weeks and As needed   Please contact office for sooner follow up if symptoms do not improve or worsen or seek emergency care

## 2022-09-30 NOTE — Progress Notes (Signed)
$'@Patient'h$  ID: [name], female    DOB: January 07, 1941, 81 y.o.   MRN: [id number]  Chief Complaint  Patient presents with   Acute Visit    Referring provider: [name], MD  HPI: 81 year old female former smoker followed for bronchiectasis, recurrent aspiration pneumonia with history of reflux and history of cryptogenic organizing pneumonia  TEST/EVENTS :  PFT 12/31/09 >> FEV1 1.84(84%), FEV1% 73, TLC 4.94(98%), DLCO 77%, +BD. PFT 06/21/16 >> FEV1 1.22 (55%), FEV1% 76, TLC 5.29 (104%), DLCO 43% PFT 12/21/16 >> FEV1 1.49 (69%), FEV1% 78, TLC 5.42 (107%), RV 158%, DLCO 51%   CT chest 08/17/10 >> mild biapical scarring, basilar peribronchovascular nodularity and mild bronchiectasis HRCT chest 11/28/17 >> mild centrilobular emphysema, peribronchovascular nodularity, mild BTX, LLL consolidation CT chest 03/02/18 >> mild emphysema, areas of BTX, several nodular areas up to 1.4 cm CT chest 08/25/18 >> centrilobular emphysema, BTX with wall thickening and tree in bud RML/lingula/bilateral lower lobes, unchanged opacity LLL, new patchy opacities LUL  HRCT chest 09/21/19 >> apical scarring, diffuse BTX with peribronchovascular nodularity, areas of mucoid impaction, coronary calcification   Echo 10/08/19 >> EF 60 to 65%, mild LVH, grade 2 DD, mild MR, mild TR controlled.  09/30/2022 Acute OV  Patient presents for an acute office visit.  She complains over the last 2 weeks she has had increased cough, congestion, thick mucus and difficulty getting up mucus.  She says the cough is causing rib and back pain.  She does not use her flutter valve because it causes dizziness.  She denies any hemoptysis, chest pain, orthopnea.  Weight continues to go down slowly.  She is currently at 78 pounds.  We discussed in detail the importance of her eating a high protein calorie diet    Allergies  Allergen Reactions   Prednisone     Does not remember reaction    Sulfa Antibiotics     Does not remember  reaction    Cortisone Other (See Comments)    Does not remember reaction    Sulfonamide Derivatives Other (See Comments)    Does not remember reaction     Immunization History  Administered Date(s) Administered   Fluad Quad(high Dose 65+) 10/22/2021, 08/13/2022   Influenza Split 10/26/2010, 11/06/2011, 11/06/2012   Influenza Whole 10/29/2009, 08/15/2010   Influenza, High Dose Seasonal PF 12/09/2016, 08/25/2018, 11/22/2018, 08/16/2019, 10/19/2019   Influenza,inj,Quad PF,6+ Mos 10/15/2014, 11/22/2014, 11/19/2015   PFIZER(Purple Top)SARS-COV-2 Vaccination 01/08/2020, 02/06/2020, 10/10/2020   Pneumococcal Conjugate-13 09/17/2013, 09/20/2014   Pneumococcal Polysaccharide-23 08/28/1992, 08/28/2000, 10/29/2004, 09/14/2013, 01/08/2015   Tdap 09/13/2011   Zoster, Live 08/27/2009, 10/19/2019, 04/10/2020    Past Medical History:  Diagnosis Date   Allergy    Arthritis    Atherosclerosis of aorta ([place]) 2019   ct   BOOP (bronchiolitis obliterans with organizing pneumonia) ([place]) 2003   Bronchiectasis    Cataract    Esophageal dysmotility    Esophageal ulcer    Gastroparesis    GERD (gastroesophageal reflux disease)    severe   Headache    migraines   Hiatal hernia    neuropathy right foot and leg   Iron deficiency anemia    Macular degeneration (senile) of retina    Neuropathy    Nutcracker esophagus    Osteopenia    Pneumonia    Pulmonary fibrosis ([place])    Raynaud's disease    Recurrent pneumonia    Due to aspiration   Thrombocytosis     Tobacco History: Social History  Tobacco Use  Smoking Status Former   Packs/day: 0.25   Years: 10.00   Total pack years: 2.50   Types: Cigarettes   Quit date: 11/15/1968   Years since quitting: 53.9  Smokeless Tobacco Never   Counseling given: Not Answered   Outpatient Medications Prior to Visit  Medication Sig Dispense Refill   acetaminophen (TYLENOL) 500 MG tablet Take 500 mg by mouth daily as needed (pain).     benzonatate  (TESSALON) 200 MG capsule TAKE 1 CAPSULE AS NEEDED FOR COUGH. 90 capsule 3   Calcium-Vitamin D-Vitamin K (VIACTIV CALCIUM PLUS D) 650-12.5-40 MG-MCG-MCG CHEW Chew 1 each by mouth daily.     dexlansoprazole (DEXILANT) 60 MG capsule Take 60 mg by mouth daily.     famotidine (PEPCID) 20 MG tablet Take 1 tablet (20 mg total) by mouth 2 (two) times daily. 180 tablet 3   guaiFENesin (MUCINEX) 600 MG 12 hr tablet Take 600 mg by mouth 2 (two) times daily as needed for to loosen phlegm or cough.     omeprazole (PRILOSEC) 40 MG capsule TAKE ONE CAPSULE BY MOUTH TWICE DAILY 60 capsule 2   pentoxifylline (TRENTAL) 400 MG CR tablet Take 1 tablet at night with meal 30 tablet 6   sucralfate (CARAFATE) 1 g tablet Take 1 tablet (1 g total) by mouth every 6 (six) hours as needed. Slowly dissolve 1 tablet in 1 Tbs of water before ingesting 60 tablet 3   No facility-administered medications prior to visit.     Review of Systems:   Constitutional:   No  weight loss, night sweats,  Fevers, chills,  +fatigue, or  lassitude.  HEENT:   No headaches,  Difficulty swallowing,  Tooth/dental problems, or  Sore throat,                No sneezing, itching, ear ache, nasal congestion, post nasal drip,   CV:  No chest pain,  Orthopnea, PND, swelling in lower extremities, anasarca, dizziness, palpitations, syncope.   GI  No heartburn, indigestion, abdominal pain, nausea, vomiting, diarrhea, change in bowel habits, loss of appetite, bloody stools.   Resp:   No chest wall deformity  Skin: no rash or lesions.  GU: no dysuria, change in color of urine, no urgency or frequency.  No flank pain, no hematuria   MS:  No joint pain or swelling.  No decreased range of motion.  No back pain.    Physical Exam  BP 110/80 (BP Location: Left Arm, Patient Position: Sitting, Cuff Size: Normal)   Pulse 68   Temp (!) 97.5 F (36.4 C) (Oral)   Ht '5\' 4"'$  (1.626 m)   Wt 78 lb 12.8 oz (35.7 kg)   SpO2 96%   BMI 13.53 kg/m   GEN:  A/Ox3; pleasant , NAD, thin and frail   HEENT:  [place]/AT,   NOSE-clear, THROAT-clear, no lesions, no postnasal drip or exudate noted.   NECK:  Supple w/ fair ROM; no JVD; normal carotid impulses w/o bruits; no thyromegaly or nodules palpated; no lymphadenopathy.    RESP coarse rhonchi bilaterally. no accessory muscle use, no dullness to percussion  CARD:  RRR, no m/r/g, no peripheral edema, pulses intact, no cyanosis or clubbing.  GI:   Soft & nt; nml bowel sounds; no organomegaly or masses detected.   Musco: Warm bil, no deformities or joint swelling noted.   Neuro: alert, no focal deficits noted.    Skin: Warm, no lesions or rashes    Lab Results:  CBC     BNP No results found for: "BNP"  ProBNP No results found for: "PROBNP"  Imaging: No results found.       Latest Ref Rng & Units 12/21/2016    2:57 PM 06/21/2016    3:57 PM  PFT Results  FVC-Pre L 2.10  1.59   FVC-Predicted Pre % 72  54   FVC-Post L 1.91  1.32   FVC-Predicted Post % 66  45   Pre FEV1/FVC % % 66  76   Post FEV1/FCV % % 78  69   FEV1-Pre L 1.39  1.22   FEV1-Predicted Pre % 64  55   FEV1-Post L 1.49  0.92   DLCO uncorrected ml/min/mmHg 12.47  10.51   DLCO UNC% % 51  43   DLCO corrected ml/min/mmHg 11.89  11.45   DLCO COR %Predicted % 49  47   DLVA Predicted % 72  79   TLC L 5.42  5.29   TLC % Predicted % 107  104   RV % Predicted % 158  164     No results found for: "NITRICOXIDE"      Assessment & Plan:   No problem-specific Assessment & Plan notes found for this encounter.     Rexene Edison, NP 09/30/2022

## 2022-09-30 NOTE — Telephone Encounter (Signed)
Results per Rexene Edison NP:  Chest x-ray shows no sign of pneumonia.  Continue with office visit recommendations and follow-up Please contact office for sooner follow up if symptoms do not improve or worsen or seek emergency care [facility]  Called and spoke with patient, provided results per Rexene Edison NP.  She verbalized understanding.  Nothing further needed.

## 2022-09-30 NOTE — Progress Notes (Signed)
Called and spoke with patient, advised of results/recommendations per Tammy Parrett NP.  She verbalized understanding.  Nothing further needed.

## 2022-10-01 ENCOUNTER — Telehealth: Payer: Self-pay | Admitting: Adult Health

## 2022-10-01 NOTE — Assessment & Plan Note (Signed)
Encouraged on a high-protein high-calorie diet

## 2022-10-01 NOTE — Progress Notes (Signed)
Reviewed and agree with assessment/plan.   Chesley Mires, MD [facility] Pulmonary/Critical Care 10/01/2022, 12:56 PM Pager:  [phone]

## 2022-10-01 NOTE — Telephone Encounter (Signed)
See message from 09/30/22.

## 2022-10-01 NOTE — Assessment & Plan Note (Signed)
Acute bronchiectatic exacerbation.  We will treat with empiric antibiotics.  Chest x-ray is pending continue with mucociliary clearance  Plan  Patient Instructions  Augmentin '875mg'$  Twice daily  For 1 week. ,take with food.  Delsym 2 tsp Twice daily  As needed   Tessalon Three times a day  As needed  cough  Sips of water to soothe throat.  Honey for cough As needed   Chest xray today (call patient )  Flutter valve if able .  High calorie/protein.  Follow up with Dr. Halford Chessman or Saman Giddens NP In 6 weeks and As needed   Please contact office for sooner follow up if symptoms do not improve or worsen or seek emergency care

## 2022-10-04 ENCOUNTER — Ambulatory Visit: Payer: Medicare HMO

## 2022-10-05 ENCOUNTER — Ambulatory Visit: Payer: Medicare HMO | Admitting: Physical Therapy

## 2022-10-06 ENCOUNTER — Encounter: Payer: Self-pay | Admitting: Physical Therapy

## 2022-10-06 ENCOUNTER — Ambulatory Visit: Payer: Medicare HMO | Admitting: Physical Therapy

## 2022-10-06 DIAGNOSIS — R42 Dizziness and giddiness: Secondary | ICD-10-CM

## 2022-10-06 DIAGNOSIS — R2681 Unsteadiness on feet: Secondary | ICD-10-CM | POA: Diagnosis not present

## 2022-10-06 NOTE — Therapy (Signed)
OUTPATIENT PHYSICAL THERAPY VESTIBULAR TREATMENT NOTE     Patient Name: [name] MRN: [id number] DOB:26-Dec-1940, 81 y.o., female Today's Date: 10/06/2022  PCP: [name], MD REFERRING PROVIDER: Ellouise Newer, MD   PT End of Session - 10/06/22 1328     Visit Number 4    Number of Visits 16    Date for PT Re-Evaluation 10/23/22    Authorization Type aetna medicare    PT Start Time 1330   Pt arrives late/has to use restroom   PT Stop Time 1400    PT Time Calculation (min) 30 min    Activity Tolerance Patient tolerated treatment well    Behavior During Therapy [facility] for tasks assessed/performed              Past Medical History:  Diagnosis Date   Allergy    Arthritis    Atherosclerosis of aorta ([place]) 2019   ct   BOOP (bronchiolitis obliterans with organizing pneumonia) ([place]) 2003   Bronchiectasis    Cataract    Esophageal dysmotility    Esophageal ulcer    Gastroparesis    GERD (gastroesophageal reflux disease)    severe   Headache    migraines   Hiatal hernia    neuropathy right foot and leg   Iron deficiency anemia    Macular degeneration (senile) of retina    Neuropathy    Nutcracker esophagus    Osteopenia    Pneumonia    Pulmonary fibrosis ([place])    Raynaud's disease    Recurrent pneumonia    Due to aspiration   Thrombocytosis    Past Surgical History:  Procedure Laterality Date   13 HOUR [place] STUDY N/A 10/13/2015   Procedure: 24 HOUR [place] STUDY;  Surgeon: Gatha Mayer, MD;  Location: [facility];  Service: Endoscopy;  Laterality: N/A;   BREAST LUMPECTOMY     left, x 2   CATARACT EXTRACTION     both eyes   CHOLECYSTECTOMY  2005   COLONOSCOPY     ESOPHAGEAL MANOMETRY N/A 10/06/2015   Procedure: ESOPHAGEAL MANOMETRY (EM);  Surgeon: Manus Gunning, MD;  Location: [facility];  Service: Gastroenterology;  Laterality: N/A;   ESOPHAGEAL MANOMETRY N/A 03/10/2018   Procedure: ESOPHAGEAL MANOMETRY (EM);  Surgeon: Mauri Pole, MD;   Location: [facility];  Service: Endoscopy;  Laterality: N/A;   ESOPHAGEAL MANOMETRY N/A 10/15/2020   Procedure: ESOPHAGEAL MANOMETRY (EM);  Surgeon: Lavena Bullion, DO;  Location: [facility];  Service: Gastroenterology;  Laterality: N/A;   ESOPHAGOGASTRODUODENOSCOPY (EGD) WITH PROPOFOL N/A 08/02/2022   Procedure: ESOPHAGOGASTRODUODENOSCOPY (EGD) WITH PROPOFOL;  Surgeon: Yetta Flock, MD;  Location: [facility];  Service: Gastroenterology;  Laterality: N/A;   FOREIGN BODY REMOVAL  08/02/2022   Procedure: FOREIGN BODY REMOVAL;  Surgeon: Yetta Flock, MD;  Location: [facility];  Service: Gastroenterology;;   LUNG BIOPSY     vats right 2003   SAVORY DILATION N/A 08/02/2022   Procedure: SAVORY DILATION;  Surgeon: Yetta Flock, MD;  Location: [facility];  Service: Gastroenterology;  Laterality: N/A;   TOTAL ABDOMINAL HYSTERECTOMY     UPPER GASTROINTESTINAL ENDOSCOPY     Patient Active Problem List   Diagnosis Date Noted   Bilateral dry eyes 01/07/2022   Pain in both lower extremities 12/23/2021   Purple toe syndrome of both feet ([place]) 12/23/2021   Iron deficiency anemia 09/25/2021   Senile purpura ([place]) 09/25/2021   Syndrome of inappropriate secretion of antidiuretic hormone ([place]) 09/25/2021  Macular degeneration 03/18/2021   Choroidal nevus, left eye 01/01/2021   Early stage nonexudative age-related macular degeneration of both eyes 01/01/2021   Thrombocytosis 12/08/2020   Bronchiolectasis ([place]) 11/27/2020   Hardening of the aorta (main artery of the heart) ([place]) 11/27/2020   Idiopathic progressive polyneuropathy 11/27/2020   Intestinal malabsorption 11/27/2020   Malnutrition of mild degree Altamease Oiler: 75% to less than 90% of standard weight) ([place]) 11/27/2020   Pulmonary fibrosis ([place]) 11/27/2020   Urge incontinence of urine 11/27/2020   Protein calorie malnutrition ([place]) 11/24/2020   Left epiretinal membrane 04/01/2020   Posterior vitreous detachment of  both eyes 04/01/2020   Burning sensation of feet 02/17/2020   Venous insufficiency 01/02/2019   Carpal tunnel syndrome of right wrist 11/24/2018   Sore throat 12/08/2017   Hoarseness 12/08/2017   Abnormal CT scan 12/06/2017   Laryngopharyngeal reflux (LPR) 10/03/2017   Temporomandibular jaw dysfunction 10/03/2017   Tension-type headache, not intractable 09/12/2017   Abdominal pain 04/28/2017   Other fatigue 04/28/2017   Pneumonia 12/14/2016   Neuropathy 07/30/2016   TIA (transient ischemic attack) 06/22/2016   New onset of headaches after age 14 06/22/2016   Dysphagia    Allergic reaction 01/11/2015   Arthritis of right ankle 10/09/2013   Pes cavus 10/09/2013   Loss of transverse plantar arch 10/09/2013   Allergic rhinitis 05/14/2013   BRONCHIECTASIS 12/15/2010   Nutcracker esophagus 01/26/2008   GERD 01/26/2008   GASTROPARESIS 01/26/2008    ONSET DATE: 08/16/2022  REFERRING DIAG: vertigo  THERAPY DIAG:  Dizziness and giddiness  Unsteadiness on feet  Rationale for Evaluation and Treatment Rehabilitation  SUBJECTIVE:   SUBJECTIVE STATEMENT: Balance is better, but not great.  Want to work on standing straighter with the cane. Pt accompanied by: self  PERTINENT HISTORY: h/o neuropathy, h/o chronic venous insufficiency.     PAIN:  Are you having pain? No and tense," my body is sore because I'm tense"  PRECAUTIONS: Fall  WEIGHT BEARING RESTRICTIONS No  FALLS: Has patient fallen in last 6 months? Yes. Number of falls 2  major falls with frequent unsteadiness and hold onto surrounding support  LIVING ENVIRONMENT: Lives with: lives with their family and lives alone Lives in: House/apartment Stairs: No Has following equipment at home: Gilford Rile - 4 wheeled and cane  PLOF: Independent and noting declining strength.  This has worsened in the past 6-7 months.   PATIENT GOALS Get my head straightened out, get stronger.  OBJECTIVE:   Pt reports using the walker at  night.  No other falls.  Used patient's SPC with rounded base.  Asked about walker use at other times for improved balance confidence, and pt reports she would not want to try to lift it in and out of car, so prefers to work with the cane.  TODAY'S TREATMENT: 10/06/2022 Activity Comments  Gait training using cane, 25 ft x 8 reps Supervision, initial frequent cues for gait sequence and posture, improves with repetition  Seated scapular retraction    Seated forward postural strengthening-forward lean to upright posture    Gentle minisquat to upright posture, x 10 reps Pt reports this feels good on her back; performs well  Gait training 30 ft x 4 with cane Occasionally gets out of sequence, but able to return to sequence with min cues       PATIENT EDUCATION: Education details: gait training sequence with pt's SPC; updates to HEP Person educated: Patient Education method: Explanation, Demonstration, and Handouts Education comprehension: verbalized understanding, returned demonstration,  and needs further education  Access Code: [email or web address] URL: [email or web address] Date: 10/06/2022-updates Prepared by: [facility]  Exercises - Seated Toe Raise  - 1 x daily - 7 x weekly - 1-2 sets - 10 reps - 3 sec hold - Seated Long Arc Quad  - 1 x daily - 7 x weekly - 1-2 sets - 10 reps - 3 sec hold - Staggered Stance Forward Backward Weight Shift with Counter Support  - 1 x daily - 7 x weekly - 1-2 sets - 10 reps - Standing Marching  - 1 x daily - 5 x weekly - 3 sets - 10 reps - 3 sec hold - Standing Gaze Stabilization with Head Rotation  - 1-2 x daily - 7 x weekly - 1 sets - 10 reps - 30 sec hold -Seated forward lean to upright posture (scapular squeeze) x 10 reps -Standing gentle squat to upright posture x 10 reps   TREATMENT: 09/22/2022 Neuro Re-education: Horizontal head motions 5 reps at edge of mat:  7/10 intensity rating Reviewed Horizontal gaze  stabilization x 1, seated x 5 reps, 7/10.  Pt continues reports doing this at home multiple times daily with varied dizziness results.    Performed standing horizontal gaze stabilization UE support at chair, 3 x 5 reps, minimal c/o dizziness.  Pt moves very slowly with neck motions, able to speed up somewhat in smaller ranges.  Berg Balance test:  31/56 (scores <45/56 indicate increased fall risk)     Therapeutic Exercise  Reviewed/performed seated exercises for R lower extremity strength:   -LAQ x 5 reps, cues for eccentric control -seated ankle pumps x 10 reps, cues for 3 sec hold  -Stagger stance forward/back rocking for dynamic stretch, strengthening of ankle musculature     ---------------------------------------------------------------------------------- Objective measures below taken at time of eval: DIAGNOSTIC FINDINGS: [facility] at [facility] showing mild sensory neuropathy and negative punch biopsy for small fiber neuropathy, no evidence of vasculitis  COGNITION: Overall cognitive status: Within functional limits for tasks assessed   SENSATION: Light touch: Impaired  prickly sensation in her legs with light touch  EDEMA: Note mild edema in feet  POSTURE: rounded shoulders and forward head   Cervical ROM:  WFLs  STRENGTH: Weakness noted with rising from a chair.  *TBA further  LOWER EXTREMITY MMT:   MMT Right eval Left eval  Hip flexion TBA TBA  Hip abduction    Hip adduction    Hip internal rotation    Hip external rotation    Knee flexion    Knee extension    Ankle dorsiflexion    Ankle plantarflexion    Ankle inversion    Ankle eversion    (Blank rows = not tested)  BED MOBILITY:  Able to move sit<>supine, but does require increased time, and provokes dizziness  TRANSFERS: Sit to stand: Modified independence  requires several attempts Stand to sit: Modified independence  GAIT: Gait pattern: decreased stride length Distance walked: 100  ft  Assistive device utilized: Single point cane Level of assistance: Modified independence  FUNCTIONAL TESTs:  5 times sit to stand: Unable to perform 5 times. *note a weak sounding cough that is not effective to clear mucous-- may add some inspiratory/expiratory breathing to HEP  PATIENT SURVEYS:  FOTO not done due to 2 referrals for different conditions   VESTIBULAR ASSESSMENT   GENERAL OBSERVATION: The patient has difficulty rising to stand from a chair in the waiting room.  She  walks into the clinic with a SPC mod independently.     SYMPTOM BEHAVIOR:   Subjective history: h/o multi-year sensation of spinning and imbalance "scared to get up quickly."   Non-Vestibular symptoms:  weakness   Type of dizziness: Spinning/Vertigo, Unsteady with head/body turns, and Lightheadedness/Faint   Frequency: daily    Duration: constant   Aggravating factors: Spontaneous, Induced by position change: sit to stand, Worse in the morning, and Occurs when standing still    Relieving factors: no known relieving factors   Progression of symptoms: worse   OCULOMOTOR EXAM:   Ocular Alignment: normal   Ocular ROM: No Limitations   Spontaneous Nystagmus: absent   Gaze-Induced Nystagmus: absent   Smooth Pursuits: intact   Saccades: intact   Lenswear:  progressive lenses worn at all times when up   VESTIBULAR - OCULAR REFLEX:    Slow VOR: Comment: provokes dizziness with slow VOR after providing demo and tactile cues for speed.  Provokes 8/10 symptoms.   VOR Cancellation: Normal   Head-Impulse Test: HIT Right: positive HIT Left:  negative for corrective saccade.   Dynamic Visual Acuity:  TBA    POSITIONAL TESTING: Worse when rising from sitting or supine.  Describes a sensation of 'weakness".  She does not get spinning with rolling or moving sit>sidelying > supine.    Sit>Sidelying:  R is positive for spinning dizziness x 1-2 minutes, no nystagmus viewed.  To L provokes a sensation of spinning  dizziness x 30 seconds.  "My  head is spinning, but the room stays still".  Returning to sitting provokes a sensation of spinning and lightheadedness.   MOTION SENSITIVITY:    Motion Sensitivity Quotient  Intensity: 0 = none, 1 = Lightheaded, 2 = Mild, 3 = Moderate, 4 = Severe, 5 = Vomiting  Intensity  1. Sitting to supine   2. Supine to L side   3. Supine to R side   4. Supine to sitting   5. L Hallpike-Dix   6. Up from L    7. R Hallpike-Dix   8. Up from R    9. Sitting, head  tipped to L knee   10. Head up from L  knee   11. Sitting, head  tipped to R knee   12. Head up from R  knee   13. Sitting head turns x5 4  14.Sitting head nods x5 4 "that's when I'd fall"  15. In stance, 180  turn to L    16. In stance, 180  turn to R     OTHOSTATICS: TBA  FUNCTIONAL GAIT:  5 time sit to stand:  unable to perform.   VESTIBULAR TREATMENT:  Gaze Adaptation:   x1 Viewing Horizontal: Position: seated, Reps: 5, and Comment: provokes dizziness to 8/10  PATIENT EDUCATION: Education details: patient Person educated: Patient Education method: Explanation, Demonstration, and Handouts Education comprehension: verbalized understanding and returned demonstration   GOALS: Goals reviewed with patient? Yes  SHORT TERM GOALS: Target date: 09/21/2022   The patient will be indep with HEP. Baseline:no HEP Goal status: GOAL MET 09/22/2022  2.  The patient will tolerate seated head turns x 5 reps with dizziness < or equal to 3/10 Baseline: 8/10>7/10 09/22/2022 Goal status:GOAL NOT MET  3.  The patient will move sit<>bilateral sidelying with dizziness < or equal to 3/10. Baseline: 8/10 -rates as 5/10 at session 09/22/2022 Goal status: GOAL NOT MET  4.  The patient will be further assessed on MMT, Berg, and TUG. Baseline:  focused on vertigo at eval. MMT and Berg completed-see above Goal status: GOAL MET  LONG TERM GOALS: Target date: 10/19/2022    Indep with progression of  HEP. Baseline: no HEP Goal status: IN PROGRESS  2.  The patient will improve Berg score by 5 points. Baseline: Merrilee Jansky 31/56 09/22/2022 Goal status: IN PROGRESS  3.  The patient will tolerate gaze x 1 viewing x 30 seconds. Baseline: Unable to tolerate 5 reps Goal status: IN PROGRESS  4.  The patient will improve TUG by 3 seconds Baseline: TBA 2nd visit Goal status: IN PROGRESS  5.  The patient will move sit<>stand with UE support 5 times. Baseline:  Unable to rise first attempt, requires multiple attempts. Goal status: IN PROGRESS  ASSESSMENT:  CLINICAL IMPRESSION: Again, pt has missed several weeks of PT appointments due to scheduling conflicts.  She returns today, with her cane and reports wanting to work on posture and gait stability with cane.  Addressed posture with seated and standing exercises, with pt is pleased with and wants to add to her HEP.  Also address gait training with cane, with pt needing cues for correct sequencing; with repetition, pt improves with overall gait sequencing, improved step length and no overt LOB.  She does report an overall feeling of 8/10 dizziness with activities and "zero" balance confidence today, though she agrees she feels more stable with proper use of cane.  She will continue to benefit from skilled PT towards LTGs for improved overall functional mobility and decreased fall risk.  OBJECTIVE IMPAIRMENTS Abnormal gait, decreased activity tolerance, decreased balance, decreased mobility, decreased strength, dizziness, impaired flexibility, and postural dysfunction.   ACTIVITY LIMITATIONS bending, standing, squatting, stairs, transfers, bed mobility, and locomotion level  PARTICIPATION LIMITATIONS: meal prep, cleaning, interpersonal relationship, and community activity  PERSONAL FACTORS 1-2 comorbidities: neuropathy, h/o vertigo  are also affecting patient's functional outcome.   REHAB POTENTIAL: Good  CLINICAL DECISION MAKING:  Stable/uncomplicated  EVALUATION COMPLEXITY: Low   PLAN: PT FREQUENCY: 2x/week  PT DURATION: 8 weeks  PLANNED INTERVENTIONS: Therapeutic exercises, Therapeutic activity, Neuromuscular re-education, Balance training, Gait training, Patient/Family education, Self Care, Joint mobilization, Vestibular training, Canalith repositioning, and Manual therapy  PLAN FOR NEXT SESSION: Review gait with cane.  Review HEP additions given this visit.  Continue to work on adding habituation HEP, retest positional vertigo, complete TUG.  Further develop HEP adding vestibular and strengthening activities.    Frazier Butt., PT 10/06/2022, 2:13 PM  [facility] at [facility]- [address] [place], [place], [place] [address] Phone # [phone] Fax # [phone]

## 2022-10-12 DIAGNOSIS — N6489 Other specified disorders of breast: Secondary | ICD-10-CM | POA: Diagnosis not present

## 2022-10-13 NOTE — Therapy (Signed)
OUTPATIENT PHYSICAL THERAPY VESTIBULAR TREATMENT NOTE     Patient Name: [name] MRN: [id number] DOB:1941/03/21, 81 y.o., female Today's Date: 10/14/2022  PCP: [name], MD REFERRING PROVIDER: Ellouise Newer, MD   PT End of Session - 10/14/22 1443     Visit Number 5    Number of Visits 16    Date for PT Re-Evaluation 10/23/22    Authorization Type [employer]etna medicare    PT Start Time 1406    PT Stop Time 1441    PT Time Calculation (min) 35 min    Equipment Utilized During Treatment Gait belt    Activity Tolerance Patient tolerated treatment well;Other (comment)   dizziness   Behavior During Therapy WFL for tasks assessed/performed               Past Medical History:  Diagnosis Date   Allergy    Arthritis    Atherosclerosis of aorta ([place]) 2019   ct   BOOP (bronchiolitis obliterans with organizing pneumonia) ([place]) 2003   Bronchiectasis    Cataract    Esophageal dysmotility    Esophageal ulcer    Gastroparesis    GERD (gastroesophageal reflux disease)    severe   Headache    migraines   Hiatal hernia    neuropathy right foot and leg   Iron deficiency anemia    Macular degeneration (senile) of retina    Neuropathy    Nutcracker esophagus    Osteopenia    Pneumonia    Pulmonary fibrosis ([place])    Raynaud's disease    Recurrent pneumonia    Due to aspiration   Thrombocytosis    Past Surgical History:  Procedure Laterality Date   39 HOUR [place] STUDY N/A 10/13/2015   Procedure: 24 HOUR [place] STUDY;  Surgeon: Gatha Mayer, MD;  Location: [facility];  Service: Endoscopy;  Laterality: N/A;   BREAST LUMPECTOMY     left, x 2   CATARACT EXTRACTION     both eyes   CHOLECYSTECTOMY  2005   COLONOSCOPY     ESOPHAGEAL MANOMETRY N/A 10/06/2015   Procedure: ESOPHAGEAL MANOMETRY (EM);  Surgeon: Manus Gunning, MD;  Location: [facility];  Service: Gastroenterology;  Laterality: N/A;   ESOPHAGEAL MANOMETRY N/A 03/10/2018   Procedure: ESOPHAGEAL MANOMETRY  (EM);  Surgeon: Mauri Pole, MD;  Location: [facility];  Service: Endoscopy;  Laterality: N/A;   ESOPHAGEAL MANOMETRY N/A 10/15/2020   Procedure: ESOPHAGEAL MANOMETRY (EM);  Surgeon: Lavena Bullion, DO;  Location: [facility];  Service: Gastroenterology;  Laterality: N/A;   ESOPHAGOGASTRODUODENOSCOPY (EGD) WITH PROPOFOL N/A 08/02/2022   Procedure: ESOPHAGOGASTRODUODENOSCOPY (EGD) WITH PROPOFOL;  Surgeon: Yetta Flock, MD;  Location: [facility];  Service: Gastroenterology;  Laterality: N/A;   FOREIGN BODY REMOVAL  08/02/2022   Procedure: FOREIGN BODY REMOVAL;  Surgeon: Yetta Flock, MD;  Location: [facility];  Service: Gastroenterology;;   LUNG BIOPSY     vats right 2003   SAVORY DILATION N/A 08/02/2022   Procedure: SAVORY DILATION;  Surgeon: Yetta Flock, MD;  Location: [facility];  Service: Gastroenterology;  Laterality: N/A;   TOTAL ABDOMINAL HYSTERECTOMY     UPPER GASTROINTESTINAL ENDOSCOPY     Patient Active Problem List   Diagnosis Date Noted   Bilateral dry eyes 01/07/2022   Pain in both lower extremities 12/23/2021   Purple toe syndrome of both feet ([place]) 12/23/2021   Iron deficiency anemia 09/25/2021   Senile purpura ([place]) 09/25/2021   Syndrome of inappropriate secretion of  antidiuretic hormone ([place]) 09/25/2021   Macular degeneration 03/18/2021   Choroidal nevus, left eye 01/01/2021   Early stage nonexudative age-related macular degeneration of both eyes 01/01/2021   Thrombocytosis 12/08/2020   Bronchiolectasis ([place]) 11/27/2020   Hardening of the aorta (main artery of the heart) ([place]) 11/27/2020   Idiopathic progressive polyneuropathy 11/27/2020   Intestinal malabsorption 11/27/2020   Malnutrition of mild degree Altamease Oiler: 75% to less than 90% of standard weight) ([place]) 11/27/2020   Pulmonary fibrosis ([place]) 11/27/2020   Urge incontinence of urine 11/27/2020   Protein calorie malnutrition ([place]) 11/24/2020   Left epiretinal membrane  04/01/2020   Posterior vitreous detachment of both eyes 04/01/2020   Burning sensation of feet 02/17/2020   Venous insufficiency 01/02/2019   Carpal tunnel syndrome of right wrist 11/24/2018   Sore throat 12/08/2017   Hoarseness 12/08/2017   Abnormal CT scan 12/06/2017   Laryngopharyngeal reflux (LPR) 10/03/2017   Temporomandibular jaw dysfunction 10/03/2017   Tension-type headache, not intractable 09/12/2017   Abdominal pain 04/28/2017   Other fatigue 04/28/2017   Pneumonia 12/14/2016   Neuropathy 07/30/2016   TIA (transient ischemic attack) 06/22/2016   New onset of headaches after age 60 06/22/2016   Dysphagia    Allergic reaction 01/11/2015   Arthritis of right ankle 10/09/2013   Pes cavus 10/09/2013   Loss of transverse plantar arch 10/09/2013   Allergic rhinitis 05/14/2013   BRONCHIECTASIS 12/15/2010   Nutcracker esophagus 01/26/2008   GERD 01/26/2008   GASTROPARESIS 01/26/2008    ONSET DATE: 08/16/2022  REFERRING DIAG: vertigo  THERAPY DIAG:  Dizziness and giddiness  Unsteadiness on feet  Rationale for Evaluation and Treatment Rehabilitation  SUBJECTIVE:   SUBJECTIVE STATEMENT: Doing good. The exercises are doing well. "I have pain but I don't want to go into that."  Pt accompanied by: self  PERTINENT HISTORY: h/o neuropathy, h/o chronic venous insufficiency.     PAIN:  Are you having pain? No and tense," my body is sore because I'm tense"  PRECAUTIONS: Fall  WEIGHT BEARING RESTRICTIONS No  FALLS: Has patient fallen in last 6 months? Yes. Number of falls 2  major falls with frequent unsteadiness and hold onto surrounding support  LIVING ENVIRONMENT: Lives with: lives with their family and lives alone Lives in: House/apartment Stairs: No Has following equipment at home: Gilford Rile - 4 wheeled and cane  PLOF: Independent and noting declining strength.  This has worsened in the past 6-7 months.   PATIENT GOALS Get my head straightened out, get  stronger.  OBJECTIVE:      TODAY'S TREATMENT: 10/14/22 Activity Comments  sitting toe raise 10x Good ROM  sitting LAQ 5x each  Good ROM  staggered ant/pos wt shift 10x  Holding onto counter  standing march 20x Holding onto counter  standing VOR horizontal 30" Good gaze stability; notes this is improving- able to recover quicker   standing VOR vertical 30"  Good tolerance  sitting forward lean to upright posture with scap squeeze 10x  Good ROM; slow pace  mini squat to upright posture 10x Valgus collapse   side step over cone 10x each CGA and B UE support on counter; cues for longer/taller step   Forward step over cone 10x each CGA and 1 UE support on counter; cues for longer/taller step   tandem walk along counter 1 UE support; c/o dizziness, requiring sit break  fwd/back stepping 10x each LE  CGA; cueing for longer step back; 1 UE support on counter  toe tap on step 2x20  1 UE support on counter; CGA         Access Code: [email or web address] URL: [email or web address] Date: 10/06/2022-updates Prepared by: [facility]  Exercises - Seated Toe Raise  - 1 x daily - 7 x weekly - 1-2 sets - 10 reps - 3 sec hold - Seated Long Arc Quad  - 1 x daily - 7 x weekly - 1-2 sets - 10 reps - 3 sec hold - Staggered Stance Forward Backward Weight Shift with Counter Support  - 1 x daily - 7 x weekly - 1-2 sets - 10 reps - Standing Marching  - 1 x daily - 5 x weekly - 3 sets - 10 reps - 3 sec hold - Standing Gaze Stabilization with Head Rotation  - 1-2 x daily - 7 x weekly - 1 sets - 10 reps - 30 sec hold -Seated forward lean to upright posture (scapular squeeze) x 10 reps -Standing gentle squat to upright posture x 10 reps    ---------------------------------------------------------------------------------- Objective measures below taken at time of eval: DIAGNOSTIC FINDINGS: EMG at [facility] showing mild sensory neuropathy and negative punch biopsy  for small fiber neuropathy, no evidence of vasculitis  COGNITION: Overall cognitive status: Within functional limits for tasks assessed   SENSATION: Light touch: Impaired  prickly sensation in her legs with light touch  EDEMA: Note mild edema in feet  POSTURE: rounded shoulders and forward head   Cervical ROM:  WFLs  STRENGTH: Weakness noted with rising from a chair.  *TBA further  LOWER EXTREMITY MMT:   MMT Right eval Left eval  Hip flexion TBA TBA  Hip abduction    Hip adduction    Hip internal rotation    Hip external rotation    Knee flexion    Knee extension    Ankle dorsiflexion    Ankle plantarflexion    Ankle inversion    Ankle eversion    (Blank rows = not tested)  BED MOBILITY:  Able to move sit<>supine, but does require increased time, and provokes dizziness  TRANSFERS: Sit to stand: Modified independence  requires several attempts Stand to sit: Modified independence  GAIT: Gait pattern: decreased stride length Distance walked: 100 ft  Assistive device utilized: Single point cane Level of assistance: Modified independence  FUNCTIONAL TESTs:  5 times sit to stand: Unable to perform 5 times. *note a weak sounding cough that is not effective to clear mucous-- may add some inspiratory/expiratory breathing to HEP  PATIENT SURVEYS:  FOTO not done due to 2 referrals for different conditions   VESTIBULAR ASSESSMENT   GENERAL OBSERVATION: The patient has difficulty rising to stand from a chair in the waiting room.  She walks into the clinic with a SPC mod independently.     SYMPTOM BEHAVIOR:   Subjective history: h/o multi-year sensation of spinning and imbalance "scared to get up quickly."   Non-Vestibular symptoms:  weakness   Type of dizziness: Spinning/Vertigo, Unsteady with head/body turns, and Lightheadedness/Faint   Frequency: daily    Duration: constant   Aggravating factors: Spontaneous, Induced by position change: sit to stand, Worse in  the morning, and Occurs when standing still    Relieving factors: no known relieving factors   Progression of symptoms: worse   OCULOMOTOR EXAM:   Ocular Alignment: normal   Ocular ROM: No Limitations   Spontaneous Nystagmus: absent   Gaze-Induced Nystagmus: absent   Smooth Pursuits: intact   Saccades: intact   Lenswear:  progressive lenses worn at all times when up   VESTIBULAR - OCULAR REFLEX:    Slow VOR: Comment: provokes dizziness with slow VOR after providing demo and tactile cues for speed.  Provokes 8/10 symptoms.   VOR Cancellation: Normal   Head-Impulse Test: HIT Right: positive HIT Left:  negative for corrective saccade.   Dynamic Visual Acuity:  TBA    POSITIONAL TESTING: Worse when rising from sitting or supine.  Describes a sensation of 'weakness".  She does not get spinning with rolling or moving sit>sidelying > supine.    Sit>Sidelying:  R is positive for spinning dizziness x 1-2 minutes, no nystagmus viewed.  To L provokes a sensation of spinning dizziness x 30 seconds.  "My  head is spinning, but the room stays still".  Returning to sitting provokes a sensation of spinning and lightheadedness.   MOTION SENSITIVITY:    Motion Sensitivity Quotient  Intensity: 0 = none, 1 = Lightheaded, 2 = Mild, 3 = Moderate, 4 = Severe, 5 = Vomiting  Intensity  1. Sitting to supine   2. Supine to L side   3. Supine to R side   4. Supine to sitting   5. L Hallpike-Dix   6. Up from L    7. R Hallpike-Dix   8. Up from R    9. Sitting, head  tipped to L knee   10. Head up from L  knee   11. Sitting, head  tipped to R knee   12. Head up from R  knee   13. Sitting head turns x5 4  14.Sitting head nods x5 4 "that's when I'd fall"  15. In stance, 180  turn to L    16. In stance, 180  turn to R     OTHOSTATICS: TBA  FUNCTIONAL GAIT:  5 time sit to stand:  unable to perform.   VESTIBULAR TREATMENT:  Gaze Adaptation:   x1 Viewing Horizontal: Position: seated, Reps:  5, and Comment: provokes dizziness to 8/10  PATIENT EDUCATION: Education details: patient Person educated: Patient Education method: Explanation, Demonstration, and Handouts Education comprehension: verbalized understanding and returned demonstration   GOALS: Goals reviewed with patient? Yes  SHORT TERM GOALS: Target date: 09/21/2022   The patient will be indep with HEP. Baseline:no HEP Goal status: GOAL MET 09/22/2022  2.  The patient will tolerate seated head turns x 5 reps with dizziness < or equal to 3/10 Baseline: 8/10>7/10 09/22/2022 Goal status:GOAL NOT MET  3.  The patient will move sit<>bilateral sidelying with dizziness < or equal to 3/10. Baseline: 8/10 -rates as 5/10 at session 09/22/2022 Goal status: GOAL NOT MET  4.  The patient will be further assessed on MMT, Berg, and TUG. Baseline: focused on vertigo at eval. MMT and Berg completed-see above Goal status: GOAL MET  LONG TERM GOALS: Target date: 10/19/2022    Indep with progression of HEP. Baseline: no HEP Goal status: IN PROGRESS  2.  The patient will improve Berg score by 5 points. Baseline: Merrilee Jansky 31/56 09/22/2022 Goal status: IN PROGRESS  3.  The patient will tolerate gaze x 1 viewing x 30 seconds. Baseline: Unable to tolerate 5 reps Goal status: IN PROGRESS  4.  The patient will improve TUG by 3 seconds Baseline: TBA 2nd visit Goal status: IN PROGRESS  5.  The patient will move sit<>stand with UE support 5 times. Baseline:  Unable to rise first attempt, requires multiple attempts. Goal status: IN PROGRESS  ASSESSMENT:  CLINICAL IMPRESSION: Patient  arrived to session with no new complaints. Reviewed HEP for max understanding and carryover- patient demonstrated good form and tolerance with these activities. Cueing required to correct alignment with squats d/t tendency for B valgus collapse. Progressed balance activities with narrow BOS, stepping over obstacles, and SLS. Patient reported some dizziness  with tandem walking and with forward/back stepping activities. This improved with sitting rest break. Overall, patient tolerated session well and without complaints upon leaving.   OBJECTIVE IMPAIRMENTS Abnormal gait, decreased activity tolerance, decreased balance, decreased mobility, decreased strength, dizziness, impaired flexibility, and postural dysfunction.   ACTIVITY LIMITATIONS bending, standing, squatting, stairs, transfers, bed mobility, and locomotion level  PARTICIPATION LIMITATIONS: meal prep, cleaning, interpersonal relationship, and community activity  PERSONAL FACTORS 1-2 comorbidities: neuropathy, h/o vertigo  are also affecting patient's functional outcome.   REHAB POTENTIAL: Good  CLINICAL DECISION MAKING: Stable/uncomplicated  EVALUATION COMPLEXITY: Low   PLAN: PT FREQUENCY: 2x/week  PT DURATION: 8 weeks  PLANNED INTERVENTIONS: Therapeutic exercises, Therapeutic activity, Neuromuscular re-education, Balance training, Gait training, Patient/Family education, Self Care, Joint mobilization, Vestibular training, Canalith repositioning, and Manual therapy  PLAN FOR NEXT SESSION: Review gait with cane. Continue to work on adding habituation HEP, retest positional vertigo, complete TUG.  Further develop HEP adding vestibular and strengthening activities.    Janene Harvey, PT, DPT 10/14/22 2:45 PM  [facility] [address] [place], [place], [place] [address] Phone # [phone] Fax # [phone]

## 2022-10-14 ENCOUNTER — Encounter: Payer: Self-pay | Admitting: Physical Therapy

## 2022-10-14 ENCOUNTER — Ambulatory Visit: Payer: Medicare HMO | Admitting: Physical Therapy

## 2022-10-14 DIAGNOSIS — R2681 Unsteadiness on feet: Secondary | ICD-10-CM

## 2022-10-14 DIAGNOSIS — R42 Dizziness and giddiness: Secondary | ICD-10-CM

## 2022-10-15 ENCOUNTER — Other Ambulatory Visit (INDEPENDENT_AMBULATORY_CARE_PROVIDER_SITE_OTHER): Payer: Medicare HMO

## 2022-10-15 ENCOUNTER — Ambulatory Visit (INDEPENDENT_AMBULATORY_CARE_PROVIDER_SITE_OTHER): Payer: Medicare HMO | Admitting: Gastroenterology

## 2022-10-15 ENCOUNTER — Encounter: Payer: Self-pay | Admitting: Gastroenterology

## 2022-10-15 VITALS — BP 118/68 | HR 67 | Ht 64.0 in | Wt 78.8 lb

## 2022-10-15 DIAGNOSIS — K219 Gastro-esophageal reflux disease without esophagitis: Secondary | ICD-10-CM | POA: Diagnosis not present

## 2022-10-15 DIAGNOSIS — Z79899 Other long term (current) drug therapy: Secondary | ICD-10-CM | POA: Diagnosis not present

## 2022-10-15 DIAGNOSIS — R1310 Dysphagia, unspecified: Secondary | ICD-10-CM | POA: Diagnosis not present

## 2022-10-15 DIAGNOSIS — R7401 Elevation of levels of liver transaminase levels: Secondary | ICD-10-CM

## 2022-10-15 LAB — COMPREHENSIVE METABOLIC PANEL
ALT: 33 U/L (ref 0–35)
AST: 27 U/L (ref 0–37)
Albumin: 4 g/dL (ref 3.5–5.2)
Alkaline Phosphatase: 69 U/L (ref 39–117)
BUN: 10 mg/dL (ref 6–23)
CO2: 29 mEq/L (ref 19–32)
Calcium: 8.8 mg/dL (ref 8.4–10.5)
Chloride: 96 mEq/L (ref 96–112)
Creatinine, Ser: 0.41 mg/dL (ref 0.40–1.20)
GFR: 93.78 mL/min (ref >60.00)
Glucose, Bld: 100 mg/dL — ABNORMAL HIGH (ref 70–99)
Potassium: 4.4 mEq/L (ref 3.5–5.1)
Sodium: 130 mEq/L — ABNORMAL LOW (ref 135–145)
Total Bilirubin: 0.3 mg/dL (ref 0.2–1.2)
Total Protein: 6.8 g/dL (ref 6.0–8.3)

## 2022-10-15 NOTE — Progress Notes (Signed)
HPI :  81 year old female here for follow-up for reflux, dysphagia.  Recall she has a history of chronic lung disease with bronchiectasis, osteopenia.  See prior notes for details of her case.  Recall she has had refractory GERD. Prior work-up has shown a DeMeester score of greater than 150 during pH study.  She has had an extensive evaluation for this in recent years.  She has regurgitation, history of aspiration, we think it is related to her bronchiectasis and chronic lung disease.  She has failed maximal medical therapy.  I previously referred her to Dr. Bryan Lemma for another opinion for possible TIF.  She has a Hill grade 4 view of the cardia, she also had esophageal manometry done showing mild EG J outflow obstruction.  Given these findings she was deemed to not be a candidate for TIF and was more appropriate for surgical repair of the hernia and fundoplication.  Given her lung disease, frailty, age, she has wanted to avoid surgical treatment for this.   She previously was on Dexilant 60 mg a day but unfortunately not covered by insurance so has switched to omeprazole 40 mg twice daily.  She has been taking Pepcid 20 mg twice daily as well as Carafate as needed.  Recall we tried some baclofen for her at night as well and she did not tolerate it. At her last visit she was having some worsening dysphagia that she really felt in her throat area.  She had a hard time eating and this was really impacting her quality of life.  Recall she had a barium study which showed nonspecific dysmotility and cricopharyngeal dysfunction suspected with marked reflux.  I offered her an EGD to be done at the hospital given her other medical problems, this was completed in September.  She had no obvious stenosis or stricture of her esophagus or inflammatory changes however empiric dilation was performed.  She states since the dilation has been done her symptoms are much better.  She is swallowing better, states that  dysphagia is really not bothering her right now she is very happy with how she is doing.  Further, she has been doing a bit better with her reflux symptoms on her medical regimen.  She again really wants to avoid surgery if at all possible.  Unfortunately she has had 2 falls within the past 6 weeks, she has neuropathy and at risk for falls.  She walks with a cane.  Fortunately, despite her osteopenia, she has not had any bone fractures.  Recall otherwise we had checked her labs prior to her endoscopy, she has hyponatremia that I recommended she see her primary care for, and also an elevated ALT.  She does not drink any alcohol, denies any history of liver disease.  Her liver enzymes historically have been normal.  She states she changed her primary care, does not think she has had her sodium levels followed up.  We had also discussed her bowels at the last visit, she has been using Dulcolax for constipation since have seen her and is working pretty well.    Prior work-up:  Colonoscopy 11/20/18 -  The perianal and digital rectal examinations were normal. - The terminal ileum appeared normal. - A 3 mm polyp was found in the ileocecal valve. The polyp was sessile. The polyp was removed with a cold snare. Resection and retrieval were complete. - A 3 mm polyp was found in the rectum. The polyp was sessile. The polyp was removed with a cold  snare. Resection and retrieval were complete. - The exam was otherwise without abnormality other than some tortousity. - Biopsies for histology were taken with a cold forceps from the right colon and left colon for evaluation of microscopic colitis given the patient's incidental complaints of persistent loose Stools.    -EGD 08/2015 - 1cm HH, gastritis without ulceration, HP negative -pH study done in 2016 showing Demeester score of 152.7 but only symptom index of 23% for reflux. Most of reflux was weakly acidic. -Esophageal manometry showed what was thought to be  nutcracker esophagus at the time; No swallows with DCI > 8000 - GES normal 2016 -Esophogram 10/10/2017  - small sliding type hiatal hernia, no distal stricture / mass, normal motility -Repeat manometry 03/10/2018 - hypercontractile esophagus, no other significant pathology; single swallow with DCI >8000 -EGD 11/20/2018- no hiatal hernia, Hill grade 2 valve, slightly irregular Z line but no BP, 3 to 4 mm duodenal adenoma removed by cold snare with otherwise normal-appearing stomach and duodenum. -EGD 07/2020-superficial ulcers in the mid esophagus (path benign), Hill grade 4, 1 cm axial by 4 cm transverse width hiatal hernia, mild gastritis -Esophageal Manometry 10/2020-normal peristalsis, normal resting pressure at EGJ with incomplete relaxation (IRP 17.6, normal <15) c/w some degree of EGJ outflow obstruction, but not achalasia   Barium study 10/06/21: IMPRESSION: 1. Tiny sliding hiatal hernia. Marked gastroesophageal reflux elicited. 2. 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 3. Evidence of mild reflux esophagitis. No evidence of esophageal mass or stricture.   EGD 08/02/2022: Esophagogastric landmarks identified. - 2 cm hiatal hernia. - Gastroesophageal flap valve classified as Hill Grade IV (no fold, wide open lumen, hiatal hernia present). - Normal esophagus otherwise - empiric dilation performed to 43m - Normal stomach. - Duodenal foreign body. Removal was successful. - Normal duodenum otherwise   Past Medical History:  Diagnosis Date   Allergy    Arthritis    [place]   [place]   [place] ([place]itis [place] with organizing [place]   Bronchiectasis    Cataract    Esophageal dysmotility    Esophageal ulcer    Gastroparesis    GERD (gastroesophageal reflux disease)    severe   Headache    migraines   Hiatal hernia    neuropathy  right foot and leg   Iron deficiency anemia    Macular degeneration (senile) of retina    Neuropathy    Nutcracker esophagus    Osteopenia    Pneumonia    Pulmonary fibrosis ([place]    Raynaud's disease    Recurrent pneumonia    Due to aspiration   Thrombocytosis      Past Surgical History:  Procedure Laterality Date   [place]/A 10/13/2015   Pro[place]: [place];  Surgeon: CGatha Mayer MD;  Location: [facility];  Service: Endoscopy;  Laterality: N/A;   BREAST LUMPECTOMY     left, x 2   CATARACT EXTRACTION     both eyes   CHOLECYSTECTOMY  2005   COLONOSCOPY     ESOPHAGEAL MANOMETRY N/A 10/06/2015   Procedure: ESOPHAGEAL MANOMETRY (EM);  Surgeon: SManus Gunning MD;  Location: [facility];  Service: Gastroenterology;  Laterality: N/A;   ESOPHAGEAL MANOMETRY N/A 03/10/2018   Procedure: ESOPHAGEAL MANOMETRY (EM);  Surgeon: NMauri Pole MD;  Location: [facility];  Service: Endoscopy;  Laterality: N/A;   ESOPHAGEAL MANOMETRY N/A 10/15/2020  Procedure: ESOPHAGEAL MANOMETRY (EM);  Surgeon: Lavena Bullion, DO;  Location: [facility];  Service: Gastroenterology;  Laterality: N/A;   ESOPHAGOGASTRODUODENOSCOPY (EGD) WITH PROPOFOL N/A 08/02/2022   Procedure: ESOPHAGOGASTRODUODENOSCOPY (EGD) WITH PROPOFOL;  Surgeon: Yetta Flock, MD;  Location: [facility];  Service: Gastroenterology;  Laterality: N/A;   FOREIGN BODY REMOVAL  08/02/2022   Procedure: FOREIGN BODY REMOVAL;  Surgeon: Yetta Flock, MD;  Location: [facility];  Service: Gastroenterology;;   LUNG BIOPSY     vats right 2003   SAVORY DILATION N/A 08/02/2022   Procedure: SAVORY DILATION;  Surgeon: Yetta Flock, MD;  Location: [facility];  Service: Gastroenterology;  Laterality: N/A;   TOTAL ABDOMINAL HYSTERECTOMY     UPPER GASTROINTESTINAL ENDOSCOPY     Family History  Problem Relation Age of Onset   Lymphoma Mother    Heart disease Father    Lung cancer  Paternal Uncle    Heart disease Paternal Grandfather    Leukemia Paternal Grandmother    Cancer Maternal Grandmother        unknown type   Colon cancer Neg Hx    Esophageal cancer Neg Hx    Rectal cancer Neg Hx    Stomach cancer Neg Hx    Social History   Tobacco Use   Smoking status: Former    Packs/day: 0.25    Years: 10.00    Total pack years: 2.50    Types: Cigarettes    Quit date: 11/15/1968    Years since quitting: 53.9   Smokeless tobacco: Never  Vaping Use   Vaping Use: Never used  Substance Use Topics   Alcohol use: No   Drug use: No   Current Outpatient Medications  Medication Sig Dispense Refill   acetaminophen (TYLENOL) 500 MG tablet Take 500 mg by mouth daily as needed (pain).     amoxicillin-clavulanate (AUGMENTIN) 875-125 MG tablet Take 1 tablet by mouth 2 (two) times daily. 14 tablet 0   benzonatate (TESSALON) 200 MG capsule Take 1 capsule (200 mg total) by mouth 3 (three) times daily as needed for cough. 90 capsule 3   Calcium-Vitamin D-Vitamin K (VIACTIV CALCIUM PLUS D) 650-12.5-40 MG-MCG-MCG CHEW Chew 1 each by mouth daily.     dexlansoprazole (DEXILANT) 60 MG capsule Take 60 mg by mouth daily.     famotidine (PEPCID) 20 MG tablet Take 1 tablet (20 mg total) by mouth 2 (two) times daily. 180 tablet 3   guaiFENesin (MUCINEX) 600 MG 12 hr tablet Take 600 mg by mouth 2 (two) times daily as needed for to loosen phlegm or cough.     omeprazole (PRILOSEC) 40 MG capsule TAKE ONE CAPSULE BY MOUTH TWICE DAILY 60 capsule 2   pentoxifylline (TRENTAL) 400 MG CR tablet Take 1 tablet at night with meal 30 tablet 6   sucralfate (CARAFATE) 1 g tablet Take 1 tablet (1 g total) by mouth every 6 (six) hours as needed. Slowly dissolve 1 tablet in 1 Tbs of water before ingesting 60 tablet 3   No current facility-administered medications for this visit.   Allergies  Allergen Reactions   Prednisone     Does not remember reaction    Sulfa Antibiotics     Does not remember  reaction    Cortisone Other (See Comments)    Does not remember reaction    Sulfonamide Derivatives Other (See Comments)    Does not remember reaction      Review of Systems: All systems reviewed and negative except  where noted in HPI.    Lab Results  Component Value Date   WBC 5.3 07/16/2022   HGB 13.0 07/16/2022   HCT 38.8 07/16/2022   MCV 92.5 07/16/2022   PLT 378.0 07/16/2022    Lab Results  Component Value Date   CREATININE 0.40 07/16/2022   BUN 8 07/16/2022   NA 128 (L) 07/16/2022   K 3.7 07/16/2022   CL 90 (L) 07/16/2022   CO2 33 (H) 07/16/2022    Lab Results  Component Value Date   ALT 54 (H) 07/16/2022   AST 37 07/16/2022   ALKPHOS 102 07/16/2022   BILITOT 0.5 07/16/2022     Physical Exam: BP 118/68   Pulse 67   Ht '5\' 4"'$  (1.626 m)   Wt 78 lb 12.8 oz (35.7 kg)   SpO2 96%   BMI 13.53 kg/m  Constitutional: Pleasant,well-developed, female in no acute distress. Neurological: Alert and oriented to person place and time. Skin: Skin is warm and dry. No rashes noted. Psychiatric: Normal mood and affect. Behavior is normal.   ASSESSMENT: 81 y.o. female here for assessment of the following  1. Gastroesophageal reflux disease, unspecified whether esophagitis present   2. Dysphagia, unspecified type   3. Long-term current use of proton pump inhibitor therapy   4. Elevated ALT measurement    As above, extensive reflux history with dysphagia.  We performed an EGD with empiric dilation at her last exam in September and that has significantly helped her dysphagia, perhaps due to cricopharyngeal dysfunction.  Otherwise her reflux seems fairly well controlled on max medical therapy.  Recall she is not interested in pursuing surgery given her lung disease, she is very high risk.  She does have osteopenia and recurrent falls, at risk for bone fracture and she understands this however she is rather miserable if she does not take the PPI.  She understands these risks  and wishes to continue high-dose PPI for now.  She will continue her regimen and follow-up with me as needed, especially if dysphagia recurs.  Otherwise reviewed her labs with her, I want to recheck her liver enzymes to make sure stable as well as her sodium levels.  She needs to follow-up with her primary care regarding hyponatremia if that persists.   PLAN: - dysphagia resolved post dilation, follow up PRN - continue omeprazole BID, pepcid, carafate. She declines surgery for reflux - management of osteopenia per PCP, understands risks of chronic PPI, fortunately no fractures in light of recent falls - lab for CMET - to recheck ALT and Na - further workup if persistent  Jolly Mango, MD [facility]

## 2022-10-15 NOTE — Patient Instructions (Signed)
If you are age 79 or older, your body mass index should be between 23-30. Your Body mass index is 13.53 kg/m. If this is out of the aforementioned range listed, please consider follow up with your Primary Care Provider.  If you are age 31 or younger, your body mass index should be between 19-25. Your Body mass index is 13.53 kg/m. If this is out of the aformentioned range listed, please consider follow up with your Primary Care Provider.   ________________________________________________________  Please go to the lab in the basement of our building to have lab work done as you leave today. Hit "B" for basement when you get on the elevator.  When the doors open the lab is on your left.  We will call you with the results. Thank you.  Continue your reflux regime.  Thank you for entrusting me with your care and for choosing [facility], Dr.  Cellar

## 2022-10-18 ENCOUNTER — Encounter: Payer: Self-pay | Admitting: Physical Therapy

## 2022-10-18 ENCOUNTER — Ambulatory Visit: Payer: Medicare HMO | Attending: Neurology | Admitting: Physical Therapy

## 2022-10-18 DIAGNOSIS — R42 Dizziness and giddiness: Secondary | ICD-10-CM | POA: Diagnosis not present

## 2022-10-18 DIAGNOSIS — R2681 Unsteadiness on feet: Secondary | ICD-10-CM | POA: Diagnosis not present

## 2022-10-18 NOTE — Therapy (Signed)
OUTPATIENT PHYSICAL THERAPY VESTIBULAR TREATMENT NOTE     Patient Name: [name] MRN: [id number] DOB:08-03-1941, 81 y.o., female Today's Date: 10/18/2022  PCP: [name], MD REFERRING PROVIDER: Ellouise Newer, MD   PT End of Session - 10/18/22 1239     Visit Number 6    Number of Visits 16    Date for PT Re-Evaluation 10/23/22    Authorization Type aetna medicare    PT Start Time 36   Pt arrives late   PT Stop Time 1320    PT Time Calculation (min) 40 min    Equipment Utilized During Treatment Gait belt    Activity Tolerance Patient tolerated treatment well;Other (comment)   mild c/o dizziness   Behavior During Therapy WFL for tasks assessed/performed               Past Medical History:  Diagnosis Date   Allergy    Arthritis    Atherosclerosis of aorta ([place]) 2019   ct   BOOP (bronchiolitis obliterans with organizing pneumonia) ([place]) 2003   Bronchiectasis    Cataract    Esophageal dysmotility    Esophageal ulcer    Gastroparesis    GERD (gastroesophageal reflux disease)    severe   Headache    migraines   Hiatal hernia    neuropathy right foot and leg   Iron deficiency anemia    Macular degeneration (senile) of retina    Neuropathy    Nutcracker esophagus    Osteopenia    Pneumonia    Pulmonary fibrosis ([place])    Raynaud's disease    Recurrent pneumonia    Due to aspiration   Thrombocytosis    Past Surgical History:  Procedure Laterality Date   9 HOUR [place] STUDY N/A 10/13/2015   Procedure: 24 HOUR [place] STUDY;  Surgeon: Gatha Mayer, MD;  Location: [facility];  Service: Endoscopy;  Laterality: N/A;   BREAST LUMPECTOMY     left, x 2   CATARACT EXTRACTION     both eyes   CHOLECYSTECTOMY  2005   COLONOSCOPY     ESOPHAGEAL MANOMETRY N/A 10/06/2015   Procedure: ESOPHAGEAL MANOMETRY (EM);  Surgeon: Manus Gunning, MD;  Location: [facility];  Service: Gastroenterology;  Laterality: N/A;   ESOPHAGEAL MANOMETRY N/A 03/10/2018    Procedure: ESOPHAGEAL MANOMETRY (EM);  Surgeon: Mauri Pole, MD;  Location: [facility];  Service: Endoscopy;  Laterality: N/A;   ESOPHAGEAL MANOMETRY N/A 10/15/2020   Procedure: ESOPHAGEAL MANOMETRY (EM);  Surgeon: Lavena Bullion, DO;  Location: [facility];  Service: Gastroenterology;  Laterality: N/A;   ESOPHAGOGASTRODUODENOSCOPY (EGD) WITH PROPOFOL N/A 08/02/2022   Procedure: ESOPHAGOGASTRODUODENOSCOPY (EGD) WITH PROPOFOL;  Surgeon: Yetta Flock, MD;  Location: [facility];  Service: Gastroenterology;  Laterality: N/A;   FOREIGN BODY REMOVAL  08/02/2022   Procedure: FOREIGN BODY REMOVAL;  Surgeon: Yetta Flock, MD;  Location: [facility];  Service: Gastroenterology;;   LUNG BIOPSY     vats right 2003   SAVORY DILATION N/A 08/02/2022   Procedure: SAVORY DILATION;  Surgeon: Yetta Flock, MD;  Location: [facility];  Service: Gastroenterology;  Laterality: N/A;   TOTAL ABDOMINAL HYSTERECTOMY     UPPER GASTROINTESTINAL ENDOSCOPY     Patient Active Problem List   Diagnosis Date Noted   Bilateral dry eyes 01/07/2022   Pain in both lower extremities 12/23/2021   Purple toe syndrome of both feet ([place]) 12/23/2021   Iron deficiency anemia 09/25/2021   Senile purpura ([place]) 09/25/2021  Syndrome of inappropriate secretion of antidiuretic hormone ([place]) 09/25/2021   Macular degeneration 03/18/2021   Choroidal nevus, left eye 01/01/2021   Early stage nonexudative age-related macular degeneration of both eyes 01/01/2021   Thrombocytosis 12/08/2020   Bronchiolectasis ([place]) 11/27/2020   Hardening of the aorta (main artery of the heart) ([place]) 11/27/2020   Idiopathic progressive polyneuropathy 11/27/2020   Intestinal malabsorption 11/27/2020   Malnutrition of mild degree Altamease Oiler: 75% to less than 90% of standard weight) ([place]) 11/27/2020   Pulmonary fibrosis ([place]) 11/27/2020   Urge incontinence of urine 11/27/2020   Protein calorie malnutrition ([place]) 11/24/2020    Left epiretinal membrane 04/01/2020   Posterior vitreous detachment of both eyes 04/01/2020   Burning sensation of feet 02/17/2020   Venous insufficiency 01/02/2019   Carpal tunnel syndrome of right wrist 11/24/2018   Sore throat 12/08/2017   Hoarseness 12/08/2017   Abnormal CT scan 12/06/2017   Laryngopharyngeal reflux (LPR) 10/03/2017   Temporomandibular jaw dysfunction 10/03/2017   Tension-type headache, not intractable 09/12/2017   Abdominal pain 04/28/2017   Other fatigue 04/28/2017   Pneumonia 12/14/2016   Neuropathy 07/30/2016   TIA (transient ischemic attack) 06/22/2016   New onset of headaches after age 51 06/22/2016   Dysphagia    Allergic reaction 01/11/2015   Arthritis of right ankle 10/09/2013   Pes cavus 10/09/2013   Loss of transverse plantar arch 10/09/2013   Allergic rhinitis 05/14/2013   BRONCHIECTASIS 12/15/2010   Nutcracker esophagus 01/26/2008   GERD 01/26/2008   GASTROPARESIS 01/26/2008    ONSET DATE: 08/16/2022  REFERRING DIAG: vertigo  THERAPY DIAG:  Dizziness and giddiness  Unsteadiness on feet  Rationale for Evaluation and Treatment Rehabilitation  SUBJECTIVE:   SUBJECTIVE STATEMENT: Did a lot at church over the weekend, stood for 3 hours; after that, I was home and was wiped out. Tried to do something on Saturday and felt sick, so I mainly rested all day yesterday. Pt accompanied by: self  PERTINENT HISTORY: h/o neuropathy, h/o chronic venous insufficiency.     PAIN:  Are you having pain? No and tense," my body is sore because I'm tense"  PRECAUTIONS: Fall  WEIGHT BEARING RESTRICTIONS No  FALLS: Has patient fallen in last 6 months? Yes. Number of falls 2  major falls with frequent unsteadiness and hold onto surrounding support  LIVING ENVIRONMENT: Lives with: lives with their family and lives alone Lives in: House/apartment Stairs: No Has following equipment at home: Gilford Rile - 4 wheeled and cane  PLOF: Independent and noting  declining strength.  This has worsened in the past 6-7 months.   PATIENT GOALS Get my head straightened out, get stronger.  OBJECTIVE:      Initial exercises are a review of HEP, with pt return demo understanding.   TODAY'S TREATMENT: 10/18/22 Activity Comments  sitting toe raise 20x Good ROM  sitting LAQ 2 x 5 reps each  Good ROM  Sit<>stand x 5 reps from mat surface With UE support  staggered ant/pos wt shift 10x  Holding onto counter  standing march 20x Holding onto counter  standing VOR horizontal 30" Good gaze stability; notes this is improving  standing VOR vertical 30"  Good tolerance  sitting forward lean to upright posture with scap squeeze 10x  Good motion  mini squat to upright posture 10x Valgus collapse-cues for technique  side step over hurdle 10x each CGA and B UE support on counter; cues for longer/taller step   Forward step over hurdle 10x each CGA and 1 UE  support on counter; cues for longer/taller step   Gait short distances no device, extra time and supervision   Gait 170 ft with cane, supervision Good technique with cane; several episodes of getting out of sequence, but able to return to good cane sequence.  No overt LOB.            Access Code: [email or web address] URL: [email or web address] Date: 10/06/2022-updates Prepared by: [facility]  Exercises - Seated Toe Raise  - 1 x daily - 7 x weekly - 1-2 sets - 10 reps - 3 sec hold - Seated Long Arc Quad  - 1 x daily - 7 x weekly - 1-2 sets - 10 reps - 3 sec hold - Staggered Stance Forward Backward Weight Shift with Counter Support  - 1 x daily - 7 x weekly - 1-2 sets - 10 reps - Standing Marching  - 1 x daily - 5 x weekly - 3 sets - 10 reps - 3 sec hold - Standing Gaze Stabilization with Head Rotation  - 1-2 x daily - 7 x weekly - 1 sets - 10 reps - 30 sec hold -Seated forward lean to upright posture (scapular squeeze) x 10 reps -Standing gentle squat to upright  posture x 10 reps  PATIENT EDUCATION: Education details: Pacing with activities, energy conservation, rest breaks and avoid overdoing activities  Person educated: Patient Education method: Explanation Education comprehension: verbalized understanding   ---------------------------------------------------------------------------------- Objective measures below taken at time of eval: DIAGNOSTIC FINDINGS: EMG at [facility] showing mild sensory neuropathy and negative punch biopsy for small fiber neuropathy, no evidence of vasculitis  COGNITION: Overall cognitive status: Within functional limits for tasks assessed   SENSATION: Light touch: Impaired  prickly sensation in her legs with light touch  EDEMA: Note mild edema in feet  POSTURE: rounded shoulders and forward head   Cervical ROM:  WFLs  STRENGTH: Weakness noted with rising from a chair.  *TBA further  LOWER EXTREMITY MMT:   MMT Right eval Left eval  Hip flexion TBA TBA  Hip abduction    Hip adduction    Hip internal rotation    Hip external rotation    Knee flexion    Knee extension    Ankle dorsiflexion    Ankle plantarflexion    Ankle inversion    Ankle eversion    (Blank rows = not tested)  BED MOBILITY:  Able to move sit<>supine, but does require increased time, and provokes dizziness  TRANSFERS: Sit to stand: Modified independence  requires several attempts Stand to sit: Modified independence  GAIT: Gait pattern: decreased stride length Distance walked: 100 ft  Assistive device utilized: Single point cane Level of assistance: Modified independence  FUNCTIONAL TESTs:  5 times sit to stand: Unable to perform 5 times. *note a weak sounding cough that is not effective to clear mucous-- may add some inspiratory/expiratory breathing to HEP  PATIENT SURVEYS:  FOTO not done due to 2 referrals for different conditions   VESTIBULAR ASSESSMENT   GENERAL OBSERVATION: The patient has difficulty rising  to stand from a chair in the waiting room.  She walks into the clinic with a SPC mod independently.     SYMPTOM BEHAVIOR:   Subjective history: h/o multi-year sensation of spinning and imbalance "scared to get up quickly."   Non-Vestibular symptoms:  weakness   Type of dizziness: Spinning/Vertigo, Unsteady with head/body turns, and Lightheadedness/Faint   Frequency: daily    Duration: constant  Aggravating factors: Spontaneous, Induced by position change: sit to stand, Worse in the morning, and Occurs when standing still    Relieving factors: no known relieving factors   Progression of symptoms: worse   OCULOMOTOR EXAM:   Ocular Alignment: normal   Ocular ROM: No Limitations   Spontaneous Nystagmus: absent   Gaze-Induced Nystagmus: absent   Smooth Pursuits: intact   Saccades: intact   Lenswear:  progressive lenses worn at all times when up   VESTIBULAR - OCULAR REFLEX:    Slow VOR: Comment: provokes dizziness with slow VOR after providing demo and tactile cues for speed.  Provokes 8/10 symptoms.   VOR Cancellation: Normal   Head-Impulse Test: HIT Right: positive HIT Left:  negative for corrective saccade.   Dynamic Visual Acuity:  TBA    POSITIONAL TESTING: Worse when rising from sitting or supine.  Describes a sensation of 'weakness".  She does not get spinning with rolling or moving sit>sidelying > supine.    Sit>Sidelying:  R is positive for spinning dizziness x 1-2 minutes, no nystagmus viewed.  To L provokes a sensation of spinning dizziness x 30 seconds.  "My  head is spinning, but the room stays still".  Returning to sitting provokes a sensation of spinning and lightheadedness.   MOTION SENSITIVITY:    Motion Sensitivity Quotient  Intensity: 0 = none, 1 = Lightheaded, 2 = Mild, 3 = Moderate, 4 = Severe, 5 = Vomiting  Intensity  1. Sitting to supine   2. Supine to L side   3. Supine to R side   4. Supine to sitting   5. L Hallpike-Dix   6. Up from L    7. R  Hallpike-Dix   8. Up from R    9. Sitting, head  tipped to L knee   10. Head up from L  knee   11. Sitting, head  tipped to R knee   12. Head up from R  knee   13. Sitting head turns x5 4  14.Sitting head nods x5 4 "that's when I'd fall"  15. In stance, 180  turn to L    16. In stance, 180  turn to R     OTHOSTATICS: TBA  FUNCTIONAL GAIT:  5 time sit to stand:  unable to perform.   VESTIBULAR TREATMENT:  Gaze Adaptation:   x1 Viewing Horizontal: Position: seated, Reps: 5, and Comment: provokes dizziness to 8/10  PATIENT EDUCATION: Education details: patient Person educated: Patient Education method: Explanation, Demonstration, and Handouts Education comprehension: verbalized understanding and returned demonstration   GOALS: Goals reviewed with patient? Yes  SHORT TERM GOALS: Target date: 09/21/2022   The patient will be indep with HEP. Baseline:no HEP Goal status: GOAL MET 09/22/2022  2.  The patient will tolerate seated head turns x 5 reps with dizziness < or equal to 3/10 Baseline: 8/10>7/10 09/22/2022 Goal status:GOAL NOT MET  3.  The patient will move sit<>bilateral sidelying with dizziness < or equal to 3/10. Baseline: 8/10 -rates as 5/10 at session 09/22/2022 Goal status: GOAL NOT MET  4.  The patient will be further assessed on MMT, Berg, and TUG. Baseline: focused on vertigo at eval. MMT and Berg completed-see above Goal status: GOAL MET  LONG TERM GOALS: Target date: 10/19/2022    Indep with progression of HEP. Baseline:  Goal status: GOAL MET, 10/18/2022  2.  The patient will improve Berg score by 5 points. Baseline: Merrilee Jansky 31/56 09/22/2022 Goal status: IN PROGRESS  3.  The  patient will tolerate gaze x 1 viewing x 30 seconds. Baseline: Unable to tolerate 5 reps Goal status: IN PROGRESS  4.  The patient will improve TUG by 3 seconds Baseline: TBA 2nd visit Goal status: IN PROGRESS  5.  The patient will move sit<>stand with UE support 5  times. Baseline:  Unable to rise first attempt, requires multiple attempts. Goal status: IN PROGRESS  ASSESSMENT:  CLINICAL IMPRESSION: Pt arrives today, reporting overdoing standing activities at church over the weekend, causing her to basically rest all day yesterday.  She is able to perform activities in PT session well today, including thorough review of HEP, with pt return demo understanding.  LTG 1 met.  She is reporting overall improvement with activities, but still decreased balance confidence.  With single limb stance and stepping activities today, pt demonstrates lower extremity fatigue and need for UE support.  She is progressing towards LTGs.  Will formally assess remaining LTGs next visit, with plans to likely extend POC (with reduced frequency) to continue to progress balance activities to decrease fall risk and improve overall functional mobility.    OBJECTIVE IMPAIRMENTS Abnormal gait, decreased activity tolerance, decreased balance, decreased mobility, decreased strength, dizziness, impaired flexibility, and postural dysfunction.   ACTIVITY LIMITATIONS bending, standing, squatting, stairs, transfers, bed mobility, and locomotion level  PARTICIPATION LIMITATIONS: meal prep, cleaning, interpersonal relationship, and community activity  PERSONAL FACTORS 1-2 comorbidities: neuropathy, h/o vertigo  are also affecting patient's functional outcome.   REHAB POTENTIAL: Good  CLINICAL DECISION MAKING: Stable/uncomplicated  EVALUATION COMPLEXITY: Low   PLAN: PT FREQUENCY: 2x/week  PT DURATION: 8 weeks  PLANNED INTERVENTIONS: Therapeutic exercises, Therapeutic activity, Neuromuscular re-education, Balance training, Gait training, Patient/Family education, Self Care, Joint mobilization, Vestibular training, Canalith repositioning, and Manual therapy  PLAN FOR NEXT SESSION: Check LTGs and renew (likely at decreased frequency).  Continue to work on adding habituation HEP, retest  positional vertigo as needed.  Progress HEP as appropriate.    Mady Haagensen, PT 10/18/22 1:26 PM Phone: [phone] Fax: [phone]  [facility] at [facility] [place], [place], [place] [address] Phone # [phone] Fax # [phone]

## 2022-10-21 ENCOUNTER — Ambulatory Visit: Payer: Medicare HMO | Admitting: Physical Therapy

## 2022-10-21 ENCOUNTER — Encounter: Payer: Self-pay | Admitting: Physical Therapy

## 2022-10-21 DIAGNOSIS — R2681 Unsteadiness on feet: Secondary | ICD-10-CM

## 2022-10-21 DIAGNOSIS — R42 Dizziness and giddiness: Secondary | ICD-10-CM | POA: Diagnosis not present

## 2022-10-21 NOTE — Therapy (Signed)
OUTPATIENT PHYSICAL THERAPY VESTIBULAR TREATMENT NOTE/RECERT/PROGRESS NOTE     Patient Name: [name] MRN: [id number] DOB:11-16-1940, 81 y.o., female Today's Date: 10/21/2022  PCP: [name], MD REFERRING PROVIDER: Ellouise Newer, MD  Progress Note Reporting Period 08/24/2022 to 10/21/2022  See note below for Objective Data and Assessment of Progress/Goals.      PT End of Session - 10/21/22 1159     Visit Number 7    Number of Visits 16    Date for PT Re-Evaluation 12/03/22    Authorization Type aetna medicare    Progress Note Due on Visit 17   completed at visit 7   PT Start Time 1154    PT Stop Time 1233    PT Time Calculation (min) 39 min    Equipment Utilized During Treatment --    Activity Tolerance Patient tolerated treatment well;Other (comment)   mild c/o dizziness   Behavior During Therapy WFL for tasks assessed/performed               Past Medical History:  Diagnosis Date   Allergy    Arthritis    Atherosclerosis of aorta ([place]) 2019   ct   BOOP (bronchiolitis obliterans with organizing pneumonia) ([place]) 2003   Bronchiectasis    Cataract    Esophageal dysmotility    Esophageal ulcer    Gastroparesis    GERD (gastroesophageal reflux disease)    severe   Headache    migraines   Hiatal hernia    neuropathy right foot and leg   Iron deficiency anemia    Macular degeneration (senile) of retina    Neuropathy    Nutcracker esophagus    Osteopenia    Pneumonia    Pulmonary fibrosis ([place])    Raynaud's disease    Recurrent pneumonia    Due to aspiration   Thrombocytosis    Past Surgical History:  Procedure Laterality Date   32 HOUR [place] STUDY N/A 10/13/2015   Procedure: 24 HOUR [place] STUDY;  Surgeon: Gatha Mayer, MD;  Location: [facility];  Service: Endoscopy;  Laterality: N/A;   BREAST LUMPECTOMY     left, x 2   CATARACT EXTRACTION     both eyes   CHOLECYSTECTOMY  2005   COLONOSCOPY     ESOPHAGEAL MANOMETRY N/A 10/06/2015    Procedure: ESOPHAGEAL MANOMETRY (EM);  Surgeon: Manus Gunning, MD;  Location: [facility];  Service: Gastroenterology;  Laterality: N/A;   ESOPHAGEAL MANOMETRY N/A 03/10/2018   Procedure: ESOPHAGEAL MANOMETRY (EM);  Surgeon: Mauri Pole, MD;  Location: [facility];  Service: Endoscopy;  Laterality: N/A;   ESOPHAGEAL MANOMETRY N/A 10/15/2020   Procedure: ESOPHAGEAL MANOMETRY (EM);  Surgeon: Lavena Bullion, DO;  Location: [facility];  Service: Gastroenterology;  Laterality: N/A;   ESOPHAGOGASTRODUODENOSCOPY (EGD) WITH PROPOFOL N/A 08/02/2022   Procedure: ESOPHAGOGASTRODUODENOSCOPY (EGD) WITH PROPOFOL;  Surgeon: Yetta Flock, MD;  Location: [facility];  Service: Gastroenterology;  Laterality: N/A;   FOREIGN BODY REMOVAL  08/02/2022   Procedure: FOREIGN BODY REMOVAL;  Surgeon: Yetta Flock, MD;  Location: [facility];  Service: Gastroenterology;;   LUNG BIOPSY     vats right 2003   SAVORY DILATION N/A 08/02/2022   Procedure: SAVORY DILATION;  Surgeon: Yetta Flock, MD;  Location: [facility];  Service: Gastroenterology;  Laterality: N/A;   TOTAL ABDOMINAL HYSTERECTOMY     UPPER GASTROINTESTINAL ENDOSCOPY     Patient Active Problem List   Diagnosis Date Noted   Bilateral dry eyes  01/07/2022   Pain in both lower extremities 12/23/2021   Purple toe syndrome of both feet ([place]) 12/23/2021   Iron deficiency anemia 09/25/2021   Senile purpura ([place]) 09/25/2021   Syndrome of inappropriate secretion of antidiuretic hormone ([place]) 09/25/2021   Macular degeneration 03/18/2021   Choroidal nevus, left eye 01/01/2021   Early stage nonexudative age-related macular degeneration of both eyes 01/01/2021   Thrombocytosis 12/08/2020   Bronchiolectasis ([place]) 11/27/2020   Hardening of the aorta (main artery of the heart) ([place]) 11/27/2020   Idiopathic progressive polyneuropathy 11/27/2020   Intestinal malabsorption 11/27/2020   Malnutrition of mild degree Altamease Oiler:  75% to less than 90% of standard weight) ([place]) 11/27/2020   Pulmonary fibrosis ([place]) 11/27/2020   Urge incontinence of urine 11/27/2020   Protein calorie malnutrition ([place]) 11/24/2020   Left epiretinal membrane 04/01/2020   Posterior vitreous detachment of both eyes 04/01/2020   Burning sensation of feet 02/17/2020   Venous insufficiency 01/02/2019   Carpal tunnel syndrome of right wrist 11/24/2018   Sore throat 12/08/2017   Hoarseness 12/08/2017   Abnormal CT scan 12/06/2017   Laryngopharyngeal reflux (LPR) 10/03/2017   Temporomandibular jaw dysfunction 10/03/2017   Tension-type headache, not intractable 09/12/2017   Abdominal pain 04/28/2017   Other fatigue 04/28/2017   Pneumonia 12/14/2016   Neuropathy 07/30/2016   TIA (transient ischemic attack) 06/22/2016   New onset of headaches after age 60 06/22/2016   Dysphagia    Allergic reaction 01/11/2015   Arthritis of right ankle 10/09/2013   Pes cavus 10/09/2013   Loss of transverse plantar arch 10/09/2013   Allergic rhinitis 05/14/2013   BRONCHIECTASIS 12/15/2010   Nutcracker esophagus 01/26/2008   GERD 01/26/2008   GASTROPARESIS 01/26/2008    ONSET DATE: 08/16/2022  REFERRING DIAG: vertigo  THERAPY DIAG:  Dizziness and giddiness  Unsteadiness on feet  Rationale for Evaluation and Treatment Rehabilitation  SUBJECTIVE:   SUBJECTIVE STATEMENT: Reports about 20% improvement since therapy started.  I'm better, but still feel very off balance.  No falls, just very unsteady. Pt accompanied by: self  PERTINENT HISTORY: h/o neuropathy, h/o chronic venous insufficiency.     PAIN:  Are you having pain? No and tense," my body is sore because I'm tense"  PRECAUTIONS: Fall  WEIGHT BEARING RESTRICTIONS No  FALLS: Has patient fallen in last 6 months? Yes. Number of falls 2  major falls with frequent unsteadiness and hold onto surrounding support  LIVING ENVIRONMENT: Lives with: lives with their family and lives  alone Lives in: House/apartment Stairs: No Has following equipment at home: Gilford Rile - 4 wheeled and cane  PLOF: Independent and noting declining strength.  This has worsened in the past 6-7 months.   PATIENT GOALS Get my head straightened out, get stronger.  OBJECTIVE:     TODAY'S TREATMENT: 10/21/2022 Activity Comments  Standing gaze stabilization x 30 sec with UE support at chair Minimal c/o dizziness, reports doing this at home (improved from only doing in sitting initially in therapy)  5x sit<>stand from mat surface, UE support:  56.29 sec   TUG:  39.91 sec with cane   Berg Balance test:  32/56 Improved from 31/56  Standing balance exercises: -heel/toe raises x 10 -side step and weightshift x 5 reps each side -forward/back step and weightshift x 5 reps For limits of stability; added to HEP       [facility] PT Assessment - 10/21/22 0001       Standardized Balance Assessment   Standardized Balance Assessment Berg Balance Test;Timed  Up and Go Test      Berg Balance Test   Sit to Stand Able to stand  independently using hands    Standing Unsupported Able to stand safely 2 minutes    Sitting with Back Unsupported but Feet Supported on Floor or Stool Able to sit safely and securely 2 minutes    Stand to Sit Controls descent by using hands    Transfers Able to transfer safely, definite need of hands    Standing Unsupported with Eyes Closed Able to stand 10 seconds with supervision    Standing Unsupported with Feet Together Able to place feet together independently and stand 1 minute safely    From Standing, Reach Forward with Outstretched Arm Can reach forward >5 cm safely (2")    From Standing Position, Pick up Object from Floor Unable to try/needs assist to keep balance    From Standing Position, Turn to Look Behind Over each Shoulder Looks behind one side only/other side shows less weight shift    Turn 360 Degrees Needs assistance while turning   48 sec   Standing Unsupported,  Alternately Place Feet on Step/Stool Able to complete >2 steps/needs minimal assist    Standing Unsupported, One Foot in Front Needs help to step but can hold 15 seconds    Standing on One Leg Tries to lift leg/unable to hold 3 seconds but remains standing independently    Total Score 32             Access Code: [email or web address] URL: [email or web address] Date: 10/21/2022-updates Prepared by: [facility]  Program Notes Standing beside counter and hold for support.  Step forward then back, 10 times on your right leg.  Then repeat stepping forward then back, 10 times on your left leg.  Exercises - Seated Toe Raise  - 1 x daily - 7 x weekly - 1-2 sets - 10 reps - 3 sec hold - Seated Long Arc Quad  - 1 x daily - 7 x weekly - 1-2 sets - 10 reps - 3 sec hold - Staggered Stance Forward Backward Weight Shift with Counter Support  - 1 x daily - 7 x weekly - 1-2 sets - 10 reps - Standing Marching  - 1 x daily - 5 x weekly - 3 sets - 10 reps - 3 sec hold - Standing Gaze Stabilization with Head Rotation  - 1-2 x daily - 7 x weekly - 1 sets - 10 reps - 30 sec hold - Heel Toe Raises with Counter Support  - 1 x daily - 5 x weekly - 3 sets - 10 reps - Side Stepping with Counter Support  - 1 x daily - 5 x weekly - 2 sets - 10 reps  PATIENT EDUCATION: Education details: HEP additions; PT POC, progress towards goals  Person educated: Patient Education method: Explanation, Demonstration, and Handouts Education comprehension: verbalized understanding, returned demonstration, and needs further education    TREATMENT: 10/18/22 Activity Comments  sitting toe raise 20x Good ROM  sitting LAQ 2 x 5 reps each  Good ROM  Sit<>stand x 5 reps from mat surface With UE support  staggered ant/pos wt shift 10x  Holding onto counter  standing march 20x Holding onto counter  standing VOR horizontal 30" Good gaze stability; notes this is improving  standing VOR vertical  30"  Good tolerance  sitting forward lean to upright posture with scap squeeze 10x  Good motion  mini squat to upright  posture 10x Valgus collapse-cues for technique  side step over hurdle 10x each CGA and B UE support on counter; cues for longer/taller step   Forward step over hurdle 10x each CGA and 1 UE support on counter; cues for longer/taller step   Gait short distances no device, extra time and supervision   Gait 170 ft with cane, supervision Good technique with cane; several episodes of getting out of sequence, but able to return to good cane sequence.  No overt LOB.              ---------------------------------------------------------------------------------- Objective measures below taken at time of eval: DIAGNOSTIC FINDINGS: [facility] at [facility] showing mild sensory neuropathy and negative punch biopsy for small fiber neuropathy, no evidence of vasculitis  COGNITION: Overall cognitive status: Within functional limits for tasks assessed   SENSATION: Light touch: Impaired  prickly sensation in her legs with light touch  EDEMA: Note mild edema in feet  POSTURE: rounded shoulders and forward head   Cervical ROM:  WFLs  STRENGTH: Weakness noted with rising from a chair.  *TBA further  LOWER EXTREMITY MMT:   MMT Right eval Left eval  Hip flexion TBA TBA  Hip abduction    Hip adduction    Hip internal rotation    Hip external rotation    Knee flexion    Knee extension    Ankle dorsiflexion    Ankle plantarflexion    Ankle inversion    Ankle eversion    (Blank rows = not tested)  BED MOBILITY:  Able to move sit<>supine, but does require increased time, and provokes dizziness  TRANSFERS: Sit to stand: Modified independence  requires several attempts Stand to sit: Modified independence  GAIT: Gait pattern: decreased stride length Distance walked: 100 ft  Assistive device utilized: Single point cane Level of assistance: Modified independence  FUNCTIONAL  TESTs:  5 times sit to stand: Unable to perform 5 times. *note a weak sounding cough that is not effective to clear mucous-- may add some inspiratory/expiratory breathing to HEP  PATIENT SURVEYS:  FOTO not done due to 2 referrals for different conditions   VESTIBULAR ASSESSMENT   GENERAL OBSERVATION: The patient has difficulty rising to stand from a chair in the waiting room.  She walks into the clinic with a SPC mod independently.     SYMPTOM BEHAVIOR:   Subjective history: h/o multi-year sensation of spinning and imbalance "scared to get up quickly."   Non-Vestibular symptoms:  weakness   Type of dizziness: Spinning/Vertigo, Unsteady with head/body turns, and Lightheadedness/Faint   Frequency: daily    Duration: constant   Aggravating factors: Spontaneous, Induced by position change: sit to stand, Worse in the morning, and Occurs when standing still    Relieving factors: no known relieving factors   Progression of symptoms: worse   OCULOMOTOR EXAM:   Ocular Alignment: normal   Ocular ROM: No Limitations   Spontaneous Nystagmus: absent   Gaze-Induced Nystagmus: absent   Smooth Pursuits: intact   Saccades: intact   Lenswear:  progressive lenses worn at all times when up   VESTIBULAR - OCULAR REFLEX:    Slow VOR: Comment: provokes dizziness with slow VOR after providing demo and tactile cues for speed.  Provokes 8/10 symptoms.   VOR Cancellation: Normal   Head-Impulse Test: HIT Right: positive HIT Left:  negative for corrective saccade.   Dynamic Visual Acuity:  TBA    POSITIONAL TESTING: Worse when rising from sitting or supine.  Describes a sensation of '  weakness".  She does not get spinning with rolling or moving sit>sidelying > supine.    Sit>Sidelying:  R is positive for spinning dizziness x 1-2 minutes, no nystagmus viewed.  To L provokes a sensation of spinning dizziness x 30 seconds.  "My  head is spinning, but the room stays still".  Returning to sitting provokes a  sensation of spinning and lightheadedness.   MOTION SENSITIVITY:    Motion Sensitivity Quotient  Intensity: 0 = none, 1 = Lightheaded, 2 = Mild, 3 = Moderate, 4 = Severe, 5 = Vomiting  Intensity  1. Sitting to supine   2. Supine to L side   3. Supine to R side   4. Supine to sitting   5. L Hallpike-Dix   6. Up from L    7. R Hallpike-Dix   8. Up from R    9. Sitting, head  tipped to L knee   10. Head up from L  knee   11. Sitting, head  tipped to R knee   12. Head up from R  knee   13. Sitting head turns x5 4  14.Sitting head nods x5 4 "that's when I'd fall"  15. In stance, 180  turn to L    16. In stance, 180  turn to R     OTHOSTATICS: TBA  FUNCTIONAL GAIT:  5 time sit to stand:  unable to perform.   VESTIBULAR TREATMENT:  Gaze Adaptation:   x1 Viewing Horizontal: Position: seated, Reps: 5, and Comment: provokes dizziness to 8/10  PATIENT EDUCATION: Education details: patient Person educated: Patient Education method: Explanation, Demonstration, and Handouts Education comprehension: verbalized understanding and returned demonstration   GOALS: Goals reviewed with patient? Yes  SHORT TERM GOALS: Target date: 09/21/2022   The patient will be indep with HEP. Baseline:no HEP Goal status: GOAL MET 09/22/2022  2.  The patient will tolerate seated head turns x 5 reps with dizziness < or equal to 3/10 Baseline: 8/10>7/10 09/22/2022 Goal status:GOAL NOT MET  3.  The patient will move sit<>bilateral sidelying with dizziness < or equal to 3/10. Baseline: 8/10 -rates as 5/10 at session 09/22/2022 Goal status: GOAL NOT MET  4.  The patient will be further assessed on MMT, Berg, and TUG. Baseline: focused on vertigo at eval. MMT and Berg completed-see above Goal status: GOAL MET  LONG TERM GOALS: Target date: 10/19/2022    Indep with progression of HEP. Baseline:  Goal status: GOAL MET, 10/18/2022  2.  The patient will improve Berg score by 5  points. Baseline: Merrilee Jansky 50/09 09/22/2022>32/56 10/21/2022 Goal status: GOAL NOT MET  3.  The patient will tolerate gaze x 1 viewing x 30 seconds. Baseline: Unable to tolerate 5 reps>performs in standing 30 sec 10/21/2022 Goal status: GOAL MET, 10/21/2022  4.  The patient will improve TUG by 3 seconds Baseline: 39.91 sec 10/21/2022 Goal status: GOAL NOT MET  5.  The patient will move sit<>stand with UE support 5 times. Baseline:  Unable to rise first attempt, requires multiple attempts. 10/21/2022:  5 reps from mat, UE support 56.29 sec Goal status: GOAL MET   SHORT TERM GOALS: *Updated at recert* Target date: 38/18/2993  Pt will be independent with HEP progression for improved balance, strength, gait. Baseline: Goal status: INITIAL  2.  Pt will improve 5x sit<>stand to less than or equal to 45 sec to demonstrate improved functional strength and transfer efficiency. Baseline: 56.29 sec 10/21/2022 Goal status: INITIAL  LONG TERM GOALS: Target date: 12/03/2022  Pt will be independent with final HEP for improved strength, balance, transfers, and gait. Baseline:  Goal status: INITIAL  2.  Pt will improve 5x sit<>stand to less than or equal to 30 sec to demonstrate improved functional strength and transfer efficiency. Baseline: 56.29 sec Goal status: INITIAL  3.  Pt will improve TUG score to less than or equal to 20 sec for decreased fall risk. Baseline: 39.91 sec 10/21/2022 Goal status: INITIAL  4.  Pt will improve Berg score to at least 40/56 to decrease fall risk. Baseline: 32/56 Goal status: INITIAL    ASSESSMENT:  CLINICAL IMPRESSION: PROGRESS NOTE and RECERT:  Pt subjectively reports about 20% improvement in her balance and mobility; she continues to report extreme unsteadiness and fear of falling.  Objective measures:  TUG 39.91 sec, 5x sit<>stand 56.29 sec, Berg test 32/56 (improved from 31/56).   *Of note, pt was not able to participate in functional mobility testing for 5x  sit<>stand and TUG at eval, due to decreased activity tolerance.  With Berg test with static standing with EO narrowed stance and EC normal BOS, pt has mild sway (*pt reports feeling extreme sway).  Pt has only had 7 visits of therapy since evaluation, with frequency of therapy not fully met due to scheduling conflicts at times.  Pt has consistently reported doing HEP at home, and she has been able to progress with activities in the last several sessions.  She is progressing slowly with functional mobility and due to pt continueing to be at fall risk, she will continue to benefit from further skilled PT to work on gait, balance, functional strength for improved functional mobility and decreased fall risk.  OBJECTIVE IMPAIRMENTS Abnormal gait, decreased activity tolerance, decreased balance, decreased mobility, decreased strength, dizziness, impaired flexibility, and postural dysfunction.   ACTIVITY LIMITATIONS bending, standing, squatting, stairs, transfers, bed mobility, and locomotion level  PARTICIPATION LIMITATIONS: meal prep, cleaning, interpersonal relationship, and community activity  PERSONAL FACTORS 1-2 comorbidities: neuropathy, h/o vertigo  are also affecting patient's functional outcome.   REHAB POTENTIAL: Good  CLINICAL DECISION MAKING: Stable/uncomplicated  EVALUATION COMPLEXITY: Low   PLAN: PT FREQUENCY:1x/wk   PT DURATION: 6 weeks-per recert 84/04/6598  PLANNED INTERVENTIONS: Therapeutic exercises, Therapeutic activity, Neuromuscular re-education, Balance training, Gait training, Patient/Family education, Self Care, Joint mobilization, Vestibular training, Canalith repositioning, and Manual therapy  PLAN FOR NEXT SESSION:  Check updates to HEP; continue to progress balance exercises.  Continue to work on adding habituation HEP, retest positional vertigo as needed.      Mady Haagensen, PT 10/21/22 1:09 PM Phone: [phone] Fax: [phone]  [facility]  at [facility], [place], [place] [address] Phone # [phone] Fax # [phone]

## 2022-10-26 ENCOUNTER — Other Ambulatory Visit: Payer: Self-pay | Admitting: Gastroenterology

## 2022-10-26 DIAGNOSIS — J8410 Pulmonary fibrosis, unspecified: Secondary | ICD-10-CM | POA: Diagnosis not present

## 2022-10-26 DIAGNOSIS — R42 Dizziness and giddiness: Secondary | ICD-10-CM | POA: Diagnosis not present

## 2022-10-26 DIAGNOSIS — E871 Hypo-osmolality and hyponatremia: Secondary | ICD-10-CM | POA: Diagnosis not present

## 2022-10-26 DIAGNOSIS — Z79899 Other long term (current) drug therapy: Secondary | ICD-10-CM | POA: Diagnosis not present

## 2022-10-26 DIAGNOSIS — G629 Polyneuropathy, unspecified: Secondary | ICD-10-CM | POA: Diagnosis not present

## 2022-10-26 DIAGNOSIS — K219 Gastro-esophageal reflux disease without esophagitis: Secondary | ICD-10-CM | POA: Diagnosis not present

## 2022-10-26 DIAGNOSIS — D508 Other iron deficiency anemias: Secondary | ICD-10-CM | POA: Diagnosis not present

## 2022-10-27 ENCOUNTER — Ambulatory Visit: Payer: Medicare HMO | Admitting: Physical Therapy

## 2022-10-27 ENCOUNTER — Encounter: Payer: Self-pay | Admitting: Physical Therapy

## 2022-10-27 DIAGNOSIS — R42 Dizziness and giddiness: Secondary | ICD-10-CM

## 2022-10-27 DIAGNOSIS — R2681 Unsteadiness on feet: Secondary | ICD-10-CM | POA: Diagnosis not present

## 2022-10-27 NOTE — Therapy (Signed)
OUTPATIENT PHYSICAL THERAPY VESTIBULAR TREATMENT NOTE     Patient Name: [name] MRN: [id number] DOB:21-Nov-1940, 81 y.o., female Today's Date: 10/27/2022  PCP: [name], MD REFERRING PROVIDER: Ellouise Newer, MD     PT End of Session - 10/27/22 1231     Visit Number 8    Number of Visits 16    Date for PT Re-Evaluation 12/03/22    Authorization Type [employer]na medicare    Progress Note Due on Visit 17   completed at visit 7   PT Start Time 1233    PT Stop Time 1311    PT Time Calculation (min) 38 min    Activity Tolerance Patient tolerated treatment well;Other (comment)   mild c/o dizziness   Behavior During Therapy WFL for tasks assessed/performed               Past Medical History:  Diagnosis Date   Allergy    Arthritis    Atherosclerosis of aorta ([place]) 2019   ct   BOOP (bronchiolitis obliterans with organizing pneumonia) ([place]) 2003   Bronchiectasis    Cataract    Esophageal dysmotility    Esophageal ulcer    Gastroparesis    GERD (gastroesophageal reflux disease)    severe   Headache    migraines   Hiatal hernia    neuropathy right foot and leg   Iron deficiency anemia    Macular degeneration (senile) of retina    Neuropathy    Nutcracker esophagus    Osteopenia    Pneumonia    Pulmonary fibrosis ([place])    Raynaud's disease    Recurrent pneumonia    Due to aspiration   Thrombocytosis    Past Surgical History:  Procedure Laterality Date   82 HOUR [place] STUDY N/A 10/13/2015   Procedure: 24 HOUR [place] STUDY;  Surgeon: Gatha Mayer, MD;  Location: [facility];  Service: Endoscopy;  Laterality: N/A;   BREAST LUMPECTOMY     left, x 2   CATARACT EXTRACTION     both eyes   CHOLECYSTECTOMY  2005   COLONOSCOPY     ESOPHAGEAL MANOMETRY N/A 10/06/2015   Procedure: ESOPHAGEAL MANOMETRY (EM);  Surgeon: Manus Gunning, MD;  Location: [facility];  Service: Gastroenterology;  Laterality: N/A;   ESOPHAGEAL MANOMETRY N/A 03/10/2018   Procedure:  ESOPHAGEAL MANOMETRY (EM);  Surgeon: Mauri Pole, MD;  Location: [facility];  Service: Endoscopy;  Laterality: N/A;   ESOPHAGEAL MANOMETRY N/A 10/15/2020   Procedure: ESOPHAGEAL MANOMETRY (EM);  Surgeon: Lavena Bullion, DO;  Location: [facility];  Service: Gastroenterology;  Laterality: N/A;   ESOPHAGOGASTRODUODENOSCOPY (EGD) WITH PROPOFOL N/A 08/02/2022   Procedure: ESOPHAGOGASTRODUODENOSCOPY (EGD) WITH PROPOFOL;  Surgeon: Yetta Flock, MD;  Location: [facility];  Service: Gastroenterology;  Laterality: N/A;   FOREIGN BODY REMOVAL  08/02/2022   Procedure: FOREIGN BODY REMOVAL;  Surgeon: Yetta Flock, MD;  Location: [facility];  Service: Gastroenterology;;   LUNG BIOPSY     vats right 2003   SAVORY DILATION N/A 08/02/2022   Procedure: SAVORY DILATION;  Surgeon: Yetta Flock, MD;  Location: [facility];  Service: Gastroenterology;  Laterality: N/A;   TOTAL ABDOMINAL HYSTERECTOMY     UPPER GASTROINTESTINAL ENDOSCOPY     Patient Active Problem List   Diagnosis Date Noted   Bilateral dry eyes 01/07/2022   Pain in both lower extremities 12/23/2021   Purple toe syndrome of both feet ([place]) 12/23/2021   Iron deficiency anemia 09/25/2021   Senile purpura (  [place]) 09/25/2021   Syndrome of inappropriate secretion of antidiuretic hormone ([place]) 09/25/2021   Macular degeneration 03/18/2021   Choroidal nevus, left eye 01/01/2021   Early stage nonexudative age-related macular degeneration of both eyes 01/01/2021   Thrombocytosis 12/08/2020   Bronchiolectasis ([place]) 11/27/2020   Hardening of the aorta (main artery of the heart) ([place]) 11/27/2020   Idiopathic progressive polyneuropathy 11/27/2020   Intestinal malabsorption 11/27/2020   Malnutrition of mild degree [place]ase O[place]: 75% to less than 90% of standard weight) ([place]) 11/27/2020   Pulmonary fibrosis ([place]) 11/27/2020   Urge incontinence of urine 11/27/2020   Protein calorie malnutrition ([place]) 11/24/2020   Left  epiretinal membrane 04/01/2020   Posterior vitreous detachment of both eyes 04/01/2020   Burning sensation of feet 02/17/2020   Venous insufficiency 01/02/2019   Carpal tunnel syndrome of right wrist 11/24/2018   Sore throat 12/08/2017   Hoarseness 12/08/2017   Abnormal CT scan 12/06/2017   Laryngopharyngeal reflux (LPR) 10/03/2017   Temporomandibular jaw dysfunction 10/03/2017   Tension-type headache, not intractable 09/12/2017   Abdominal pain 04/28/2017   Other fatigue 04/28/2017   Pneumonia 12/14/2016   Neuropathy 07/30/2016   TIA (transient ischemic attack) 06/22/2016   New onset of headaches after age 33 06/22/2016   Dysphagia    Allergic reaction 01/11/2015   Arthritis of right ankle 10/09/2013   Pes cavus 10/09/2013   Loss of transverse plantar arch 10/09/2013   Allergic rhinitis 05/14/2013   BRONCHIECTASIS 12/15/2010   Nutcracker esophagus 01/26/2008   GERD 01/26/2008   GASTROPARESIS 01/26/2008    ONSET DATE: 08/16/2022  REFERRING DIAG: vertigo  THERAPY DIAG:  Dizziness and giddiness  Unsteadiness on feet  Rationale for Evaluation and Treatment Rehabilitation  SUBJECTIVE:   SUBJECTIVE STATEMENT: Would be okay if my head would feel right.  Still having the dizzy feeling.  Went to see the doctor yesterday and he took blood work. Pt accompanied by: self  PERTINENT HISTORY: h/o neuropathy, h/o chronic venous insufficiency.     PAIN:  Are you having pain? No and tense," my body is sore because I'm tense"  PRECAUTIONS: Fall  WEIGHT BEARING RESTRICTIONS No  FALLS: Has patient fallen in last 6 months? Yes. Number of falls 2  major falls with frequent unsteadiness and hold onto surrounding support  LIVING ENVIRONMENT: Lives with: lives with their family and lives alone Lives in: House/apartment Stairs: No Has following equipment at home: Gilford Rile - 4 wheeled and cane  PLOF: Independent and noting declining strength.  This has worsened in the past 6-7  months.   PATIENT GOALS Get my head straightened out, get stronger.  OBJECTIVE:    TODAY'S TREATMENT: 10/27/2022 Activity Comments  Reviewed HEP additions from last visit: -heel/toe raises -side step out and in -forward/back step Return demo understanding; slowed pace  Quarter turns 3 reps to R, 3 reps to L Rates dizziness as 8/10 initially, increase in unsteadiness with motion  Feet together EO/EC 30 seconds Light UE support  Partial heel/toe EO and EC 30  Light UE support  Single limb stance, 3 x 15 sec  Light UE support  Single limb step activities: -alt step taps to 4", 8"step x 10 each -forward step ups, 2 x 5 reps 1-2 UE support   Reports just not steady after completion of exercises.  Discussed relation of neuropathy and decreased vestibular system use contributing to balance issues.  Discussed using cane/walker and plenty of light for compensations for optimal safety with mobility.  PATIENT EDUCATION: Education details: See  above Person educated: Patient Education method: Explanation Education comprehension: verbalized understanding   TREATMENT: 10/21/2022 Activity Comments  Standing gaze stabilization x 30 sec with UE support at chair Minimal c/o dizziness, reports doing this at home (improved from only doing in sitting initially in therapy)  5x sit<>stand from mat surface, UE support:  56.29 sec   TUG:  39.91 sec with cane   Berg Balance test:  32/56 Improved from 31/56  Standing balance exercises: -heel/toe raises x 10 -side step and weightshift x 5 reps each side -forward/back step and weightshift x 5 reps For limits of stability; added to HEP         Access Code: [email or web address] URL: [email or web address] Date: 10/21/2022-updates Prepared by: [facility]  Program Notes Standing beside counter and hold for support.  Step forward then back, 10 times on your right leg.  Then repeat stepping forward then back, 10  times on your left leg.  Exercises - Seated Toe Raise  - 1 x daily - 7 x weekly - 1-2 sets - 10 reps - 3 sec hold - Seated Long Arc Quad  - 1 x daily - 7 x weekly - 1-2 sets - 10 reps - 3 sec hold - Staggered Stance Forward Backward Weight Shift with Counter Support  - 1 x daily - 7 x weekly - 1-2 sets - 10 reps - Standing Marching  - 1 x daily - 5 x weekly - 3 sets - 10 reps - 3 sec hold - Standing Gaze Stabilization with Head Rotation  - 1-2 x daily - 7 x weekly - 1 sets - 10 reps - 30 sec hold - Heel Toe Raises with Counter Support  - 1 x daily - 5 x weekly - 3 sets - 10 reps - Side Stepping with Counter Support  - 1 x daily - 5 x weekly - 2 sets - 10 reps    TREATMENT: 10/18/22 Activity Comments  sitting toe raise 20x Good ROM  sitting LAQ 2 x 5 reps each  Good ROM  Sit<>stand x 5 reps from mat surface With UE support  staggered ant/pos wt shift 10x  Holding onto counter  standing march 20x Holding onto counter  standing VOR horizontal 30" Good gaze stability; notes this is improving  standing VOR vertical 30"  Good tolerance  sitting forward lean to upright posture with scap squeeze 10x  Good motion  mini squat to upright posture 10x Valgus collapse-cues for technique  side step over hurdle 10x each CGA and B UE support on counter; cues for longer/taller step   Forward step over hurdle 10x each CGA and 1 UE support on counter; cues for longer/taller step   Gait short distances no device, extra time and supervision   Gait 170 ft with cane, supervision Good technique with cane; several episodes of getting out of sequence, but able to return to good cane sequence.  No overt LOB.              ---------------------------------------------------------------------------------- Objective measures below taken at time of eval: DIAGNOSTIC FINDINGS: EMG at [facility] showing mild sensory neuropathy and negative punch biopsy for small fiber neuropathy, no evidence of  vasculitis  COGNITION: Overall cognitive status: Within functional limits for tasks assessed   SENSATION: Light touch: Impaired  prickly sensation in her legs with light touch  EDEMA: Note mild edema in feet  POSTURE: rounded shoulders and forward head   Cervical ROM:  WFLs  STRENGTH: Weakness noted with rising from a chair.  *TBA further  LOWER EXTREMITY MMT:   MMT Right eval Left eval  Hip flexion TBA TBA  Hip abduction    Hip adduction    Hip internal rotation    Hip external rotation    Knee flexion    Knee extension    Ankle dorsiflexion    Ankle plantarflexion    Ankle inversion    Ankle eversion    (Blank rows = not tested)  BED MOBILITY:  Able to move sit<>supine, but does require increased time, and provokes dizziness  TRANSFERS: Sit to stand: Modified independence  requires several attempts Stand to sit: Modified independence  GAIT: Gait pattern: decreased stride length Distance walked: 100 ft  Assistive device utilized: Single point cane Level of assistance: Modified independence  FUNCTIONAL TESTs:  5 times sit to stand: Unable to perform 5 times. *note a weak sounding cough that is not effective to clear mucous-- may add some inspiratory/expiratory breathing to HEP  PATIENT SURVEYS:  FOTO not done due to 2 referrals for different conditions   VESTIBULAR ASSESSMENT   GENERAL OBSERVATION: The patient has difficulty rising to stand from a chair in the waiting room.  She walks into the clinic with a SPC mod independently.     SYMPTOM BEHAVIOR:   Subjective history: h/o multi-year sensation of spinning and imbalance "scared to get up quickly."   Non-Vestibular symptoms:  weakness   Type of dizziness: Spinning/Vertigo, Unsteady with head/body turns, and Lightheadedness/Faint   Frequency: daily    Duration: constant   Aggravating factors: Spontaneous, Induced by position change: sit to stand, Worse in the morning, and Occurs when standing still     Relieving factors: no known relieving factors   Progression of symptoms: worse   OCULOMOTOR EXAM:   Ocular Alignment: normal   Ocular ROM: No Limitations   Spontaneous Nystagmus: absent   Gaze-Induced Nystagmus: absent   Smooth Pursuits: intact   Saccades: intact   Lenswear:  progressive lenses worn at all times when up   VESTIBULAR - OCULAR REFLEX:    Slow VOR: Comment: provokes dizziness with slow VOR after providing demo and tactile cues for speed.  Provokes 8/10 symptoms.   VOR Cancellation: Normal   Head-Impulse Test: HIT Right: positive HIT Left:  negative for corrective saccade.   Dynamic Visual Acuity:  TBA    POSITIONAL TESTING: Worse when rising from sitting or supine.  Describes a sensation of 'weakness".  She does not get spinning with rolling or moving sit>sidelying > supine.    Sit>Sidelying:  R is positive for spinning dizziness x 1-2 minutes, no nystagmus viewed.  To L provokes a sensation of spinning dizziness x 30 seconds.  "My  head is spinning, but the room stays still".  Returning to sitting provokes a sensation of spinning and lightheadedness.   MOTION SENSITIVITY:    Motion Sensitivity Quotient  Intensity: 0 = none, 1 = Lightheaded, 2 = Mild, 3 = Moderate, 4 = Severe, 5 = Vomiting  Intensity  1. Sitting to supine   2. Supine to L side   3. Supine to R side   4. Supine to sitting   5. L Hallpike-Dix   6. Up from L    7. R Hallpike-Dix   8. Up from R    9. Sitting, head  tipped to L knee   10. Head up from L  knee   11. Sitting, head  tipped to R knee  12. Head up from R  knee   13. Sitting head turns x5 4  14.Sitting head nods x5 4 "that's when I'd fall"  15. In stance, 180  turn to L    16. In stance, 180  turn to R     OTHOSTATICS: TBA  FUNCTIONAL GAIT:  5 time sit to stand:  unable to perform.   VESTIBULAR TREATMENT:  Gaze Adaptation:   x1 Viewing Horizontal: Position: seated, Reps: 5, and Comment: provokes dizziness to  8/10  PATIENT EDUCATION: Education details: patient Person educated: Patient Education method: Explanation, Demonstration, and Handouts Education comprehension: verbalized understanding and returned demonstration   GOALS: Goals reviewed with patient? Yes    SHORT TERM GOALS: *Updated at recert* Target date: 88/41/6606  Pt will be independent with HEP progression for improved balance, strength, gait. Baseline: Goal status: INITIAL  2.  Pt will improve 5x sit<>stand to less than or equal to 45 sec to demonstrate improved functional strength and transfer efficiency. Baseline: 56.29 sec 10/21/2022 Goal status: INITIAL  LONG TERM GOALS: Target date: 12/03/2022  Pt will be independent with final HEP for improved strength, balance, transfers, and gait. Baseline:  Goal status: INITIAL  2.  Pt will improve 5x sit<>stand to less than or equal to 30 sec to demonstrate improved functional strength and transfer efficiency. Baseline: 56.29 sec Goal status: INITIAL  3.  Pt will improve TUG score to less than or equal to 20 sec for decreased fall risk. Baseline: 39.91 sec 10/21/2022 Goal status: INITIAL  4.  Pt will improve Berg score to at least 40/56 to decrease fall risk. Baseline: 32/56 Goal status: INITIAL    ASSESSMENT:  CLINICAL IMPRESSION: Skilled PT session today focused on continued progress with standing balance exercises.  Pt tolerates static standing in narrowed BOS with EO and EC well, needing light UE support, but not noting a great deal of sway.  Worked on single limb stance activities as well, with UE support.  Pt has most difficulty today with quarter turn activities, with increased reports of sway and unsteadiness.  Pt reports this dizziness/unsteadiness sensation throughout therapy session, and reports this is an ongoing thing (she has let her MD know and saw him yesterday).  She will continue to benefit from skilled PT towards goals for improved overall functional  mobility and decreased fall risk.  OBJECTIVE IMPAIRMENTS Abnormal gait, decreased activity tolerance, decreased balance, decreased mobility, decreased strength, dizziness, impaired flexibility, and postural dysfunction.   ACTIVITY LIMITATIONS bending, standing, squatting, stairs, transfers, bed mobility, and locomotion level  PARTICIPATION LIMITATIONS: meal prep, cleaning, interpersonal relationship, and community activity  PERSONAL FACTORS 1-2 comorbidities: neuropathy, h/o vertigo  are also affecting patient's functional outcome.   REHAB POTENTIAL: Good  CLINICAL DECISION MAKING: Stable/uncomplicated  EVALUATION COMPLEXITY: Low   PLAN: PT FREQUENCY:1x/wk   PT DURATION: 6 weeks-per recert 30/11/6008  PLANNED INTERVENTIONS: Therapeutic exercises, Therapeutic activity, Neuromuscular re-education, Balance training, Gait training, Patient/Family education, Self Care, Joint mobilization, Vestibular training, Canalith repositioning, and Manual therapy  PLAN FOR NEXT SESSION:  Try to progress HEP for varied narrow stance foot positions; continue to progress balance exercises.  Continue to work on adding habituation HEP, retest positional vertigo as needed.      Mady Haagensen, PT 10/27/22 1:15 PM Phone: [phone] Fax: [phone]  [facility] at [facility] [place], [place], [place] [address] Phone # [phone] Fax # [phone]

## 2022-11-03 ENCOUNTER — Ambulatory Visit: Payer: Medicare HMO | Admitting: Physical Therapy

## 2022-11-04 ENCOUNTER — Encounter: Payer: Self-pay | Admitting: Physical Therapy

## 2022-11-04 ENCOUNTER — Ambulatory Visit: Payer: Medicare HMO | Admitting: Physical Therapy

## 2022-11-04 DIAGNOSIS — R42 Dizziness and giddiness: Secondary | ICD-10-CM

## 2022-11-04 DIAGNOSIS — R2681 Unsteadiness on feet: Secondary | ICD-10-CM

## 2022-11-04 NOTE — Therapy (Signed)
OUTPATIENT PHYSICAL THERAPY VESTIBULAR TREATMENT NOTE     Patient Name: [name] MRN: [id number] DOB:10-03-1941, 81 y.o., female Today's Date: 11/04/2022  PCP: [name], MD REFERRING PROVIDER: Ellouise Newer, MD     PT End of Session - 11/04/22 1154     Visit Number 9    Number of Visits 16    Date for PT Re-Evaluation 12/03/22    Authorization Type aetna medicare    Progress Note Due on Visit 17   completed at visit 7   PT Start Time 1156   Pt arrives late, then in restroom   PT Stop Time 1236    PT Time Calculation (min) 40 min    Equipment Utilized During Treatment Gait belt    Activity Tolerance Patient tolerated treatment well;Other (comment)   mild c/o dizziness   Behavior During Therapy WFL for tasks assessed/performed               Past Medical History:  Diagnosis Date   Allergy    Arthritis    Atherosclerosis of aorta ([place]) 2019   ct   BOOP (bronchiolitis obliterans with organizing pneumonia) ([place]) 2003   Bronchiectasis    Cataract    Esophageal dysmotility    Esophageal ulcer    Gastroparesis    GERD (gastroesophageal reflux disease)    severe   Headache    migraines   Hiatal hernia    neuropathy right foot and leg   Iron deficiency anemia    Macular degeneration (senile) of retina    Neuropathy    Nutcracker esophagus    Osteopenia    Pneumonia    Pulmonary fibrosis ([place])    Raynaud's disease    Recurrent pneumonia    Due to aspiration   Thrombocytosis    Past Surgical History:  Procedure Laterality Date   3 HOUR [place] STUDY N/A 10/13/2015   Procedure: 24 HOUR [place] STUDY;  Surgeon: Gatha Mayer, MD;  Location: [facility];  Service: Endoscopy;  Laterality: N/A;   BREAST LUMPECTOMY     left, x 2   CATARACT EXTRACTION     both eyes   CHOLECYSTECTOMY  2005   COLONOSCOPY     ESOPHAGEAL MANOMETRY N/A 10/06/2015   Procedure: ESOPHAGEAL MANOMETRY (EM);  Surgeon: Manus Gunning, MD;  Location: [facility];  Service:  Gastroenterology;  Laterality: N/A;   ESOPHAGEAL MANOMETRY N/A 03/10/2018   Procedure: ESOPHAGEAL MANOMETRY (EM);  Surgeon: Mauri Pole, MD;  Location: [facility];  Service: Endoscopy;  Laterality: N/A;   ESOPHAGEAL MANOMETRY N/A 10/15/2020   Procedure: ESOPHAGEAL MANOMETRY (EM);  Surgeon: Lavena Bullion, DO;  Location: [facility];  Service: Gastroenterology;  Laterality: N/A;   ESOPHAGOGASTRODUODENOSCOPY (EGD) WITH PROPOFOL N/A 08/02/2022   Procedure: ESOPHAGOGASTRODUODENOSCOPY (EGD) WITH PROPOFOL;  Surgeon: Yetta Flock, MD;  [facility];  Service: Gastroenterology;  Laterality: N/A;   FOREIGN BODY REMOVAL  08/02/2022   Procedure: FOREIGN BODY REMOVAL;  Surgeon: Yetta Flock, MD;  Location: [facility];  Service: Gastroenterology;;   LUNG BIOPSY     vats right 2003   SAVORY DILATION N/A 08/02/2022   Procedure: SAVORY DILATION;  Surgeon: Yetta Flock, MD;  [facility];  Service: Gastroenterology;  Laterality: N/A;   TOTAL ABDOMINAL HYSTERECTOMY     UPPER GASTROINTESTINAL ENDOSCOPY     Patient Active Problem List   Diagnosis Date Noted   Bilateral dry eyes 01/07/2022   Pain in both lower extremities 12/23/2021   Purple toe  syndrome of both feet ([place]) 12/23/2021   Iron deficiency anemia 09/25/2021   Senile purpura ([place]) 09/25/2021   Syndrome of inappropriate secretion of antidiuretic hormone ([place]) 09/25/2021   Macular degeneration 03/18/2021   Choroidal nevus, left eye 01/01/2021   Early stage nonexudative age-related macular degeneration of both eyes 01/01/2021   Thrombocytosis 12/08/2020   Bronchiolectasis ([place]) 11/27/2020   Hardening of the aorta (main artery of the heart) ([place]) 11/27/2020   Idiopathic progressive polyneuropathy 11/27/2020   Intestinal malabsorption 11/27/2020   Malnutrition of mild degree Altamease Oiler: 75% to less than 90% of standard weight) ([place]) 11/27/2020   Pulmonary fibrosis ([place]) 11/27/2020   Urge  incontinence of urine 11/27/2020   Protein calorie malnutrition ([place]) 11/24/2020   Left epiretinal membrane 04/01/2020   Posterior vitreous detachment of both eyes 04/01/2020   Burning sensation of feet 02/17/2020   Venous insufficiency 01/02/2019   Carpal tunnel syndrome of right wrist 11/24/2018   Sore throat 12/08/2017   Hoarseness 12/08/2017   Abnormal CT scan 12/06/2017   Laryngopharyngeal reflux (LPR) 10/03/2017   Temporomandibular jaw dysfunction 10/03/2017   Tension-type headache, not intractable 09/12/2017   Abdominal pain 04/28/2017   Other fatigue 04/28/2017   Pneumonia 12/14/2016   Neuropathy 07/30/2016   TIA (transient ischemic attack) 06/22/2016   New onset of headaches after age 18 06/22/2016   Dysphagia    Allergic reaction 01/11/2015   Arthritis of right ankle 10/09/2013   Pes cavus 10/09/2013   Loss of transverse plantar arch 10/09/2013   Allergic rhinitis 05/14/2013   BRONCHIECTASIS 12/15/2010   Nutcracker esophagus 01/26/2008   GERD 01/26/2008   GASTROPARESIS 01/26/2008    ONSET DATE: 08/16/2022  REFERRING DIAG: vertigo  THERAPY DIAG:  Dizziness and giddiness  Unsteadiness on feet  Rationale for Evaluation and Treatment Rehabilitation  SUBJECTIVE:   SUBJECTIVE STATEMENT: Things going well, today I'm tired.   Haven't been as dizzy because I've been taking some medicine.  Have had iron added and I have more strength and energy. Pt accompanied by: self  PERTINENT HISTORY: h/o neuropathy, h/o chronic venous insufficiency.     PAIN:  Are you having pain? No and tense," my body is sore because I'm tense"  PRECAUTIONS: Fall  WEIGHT BEARING RESTRICTIONS No  FALLS: Has patient fallen in last 6 months? Yes. Number of falls 2  major falls with frequent unsteadiness and hold onto surrounding support  LIVING ENVIRONMENT: Lives with: lives with their family and lives alone Lives in: House/apartment Stairs: No Has following equipment at home: Gilford Rile  - 4 wheeled and cane  PLOF: Independent and noting declining strength.  This has worsened in the past 6-7 months.   PATIENT GOALS Get my head straightened out, get stronger.  OBJECTIVE:    TODAY'S TREATMENT: 11/04/2022 Activity Comments  Sit<>stand x 5 reps from elevated mat surface Slowed pace, UE support  Side step/together R and L x 5 reps   Quarter turns 3 reps to R, 3 reps to L Rates dizziness as 8/10 initially to L; better to R side  Feet together: -EO gaze stabilization horizontal and vertical -EC 30 seconds Light UE support  Partial heel/toe foot position: -EO gaze stabilization horizontal and vertical -EC 30 seconds Light UE support  Single limb stance, 3 x 15 sec  Light UE support  Single limb step activities: -alt step taps to 4" step x 10 each -forward step ups, 10 reps BUE support       Access Code: [email or web address] URL: [email or web address] Date: 10/21/2022-updates Prepared  by: [facility]  Program Notes Standing beside counter and hold for support.  Step forward then back, 10 times on your right leg.  Then repeat stepping forward then back, 10 times on your left leg.  Exercises - Seated Toe Raise  - 1 x daily - 7 x weekly - 1-2 sets - 10 reps - 3 sec hold - Seated Long Arc Quad  - 1 x daily - 7 x weekly - 1-2 sets - 10 reps - 3 sec hold - Staggered Stance Forward Backward Weight Shift with Counter Support  - 1 x daily - 7 x weekly - 1-2 sets - 10 reps - Standing Marching  - 1 x daily - 5 x weekly - 3 sets - 10 reps - 3 sec hold - Standing Gaze Stabilization with Head Rotation  - 1-2 x daily - 7 x weekly - 1 sets - 10 reps - 30 sec hold - Heel Toe Raises with Counter Support  - 1 x daily - 5 x weekly - 3 sets - 10 reps - Side Stepping with Counter Support  - 1 x daily - 5 x weekly - 2 sets - 10 reps          ---------------------------------------------------------------------------------- Objective  measures below taken at time of eval: DIAGNOSTIC FINDINGS: EMG at [facility] showing mild sensory neuropathy and negative punch biopsy for small fiber neuropathy, no evidence of vasculitis  COGNITION: Overall cognitive status: Within functional limits for tasks assessed   SENSATION: Light touch: Impaired  prickly sensation in her legs with light touch  EDEMA: Note mild edema in feet  POSTURE: rounded shoulders and forward head   Cervical ROM:  WFLs  STRENGTH: Weakness noted with rising from a chair.  *TBA further  LOWER EXTREMITY MMT:   MMT Right eval Left eval  Hip flexion TBA TBA  Hip abduction    Hip adduction    Hip internal rotation    Hip external rotation    Knee flexion    Knee extension    Ankle dorsiflexion    Ankle plantarflexion    Ankle inversion    Ankle eversion    (Blank rows = not tested)  BED MOBILITY:  Able to move sit<>supine, but does require increased time, and provokes dizziness  TRANSFERS: Sit to stand: Modified independence  requires several attempts Stand to sit: Modified independence  GAIT: Gait pattern: decreased stride length Distance walked: 100 ft  Assistive device utilized: Single point cane Level of assistance: Modified independence  FUNCTIONAL TESTs:  5 times sit to stand: Unable to perform 5 times. *note a weak sounding cough that is not effective to clear mucous-- may add some inspiratory/expiratory breathing to HEP  PATIENT SURVEYS:  FOTO not done due to 2 referrals for different conditions   VESTIBULAR ASSESSMENT   GENERAL OBSERVATION: The patient has difficulty rising to stand from a chair in the waiting room.  She walks into the clinic with a SPC mod independently.     SYMPTOM BEHAVIOR:   Subjective history: h/o multi-year sensation of spinning and imbalance "scared to get up quickly."   Non-Vestibular symptoms:  weakness   Type of dizziness: Spinning/Vertigo, Unsteady with head/body turns, and  Lightheadedness/Faint   Frequency: daily    Duration: constant   Aggravating factors: Spontaneous, Induced by position change: sit to stand, Worse in the morning, and Occurs when standing still    Relieving factors: no known relieving factors   Progression  of symptoms: worse   OCULOMOTOR EXAM:   Ocular Alignment: normal   Ocular ROM: No Limitations   Spontaneous Nystagmus: absent   Gaze-Induced Nystagmus: absent   Smooth Pursuits: intact   Saccades: intact   Lenswear:  progressive lenses worn at all times when up   VESTIBULAR - OCULAR REFLEX:    Slow VOR: Comment: provokes dizziness with slow VOR after providing demo and tactile cues for speed.  Provokes 8/10 symptoms.   VOR Cancellation: Normal   Head-Impulse Test: HIT Right: positive HIT Left:  negative for corrective saccade.   Dynamic Visual Acuity:  TBA    POSITIONAL TESTING: Worse when rising from sitting or supine.  Describes a sensation of 'weakness".  She does not get spinning with rolling or moving sit>sidelying > supine.    Sit>Sidelying:  R is positive for spinning dizziness x 1-2 minutes, no nystagmus viewed.  To L provokes a sensation of spinning dizziness x 30 seconds.  "My  head is spinning, but the room stays still".  Returning to sitting provokes a sensation of spinning and lightheadedness.   MOTION SENSITIVITY:    Motion Sensitivity Quotient  Intensity: 0 = none, 1 = Lightheaded, 2 = Mild, 3 = Moderate, 4 = Severe, 5 = Vomiting  Intensity  1. Sitting to supine   2. Supine to L side   3. Supine to R side   4. Supine to sitting   5. L Hallpike-Dix   6. Up from L    7. R Hallpike-Dix   8. Up from R    9. Sitting, head  tipped to L knee   10. Head up from L  knee   11. Sitting, head  tipped to R knee   12. Head up from R  knee   13. Sitting head turns x5 4  14.Sitting head nods x5 4 "that's when I'd fall"  15. In stance, 180  turn to L    16. In stance, 180  turn to R     OTHOSTATICS:  TBA  FUNCTIONAL GAIT:  5 time sit to stand:  unable to perform.   VESTIBULAR TREATMENT:  Gaze Adaptation:   x1 Viewing Horizontal: Position: seated, Reps: 5, and Comment: provokes dizziness to 8/10  PATIENT EDUCATION: Education details: patient Person educated: Patient Education method: Explanation, Demonstration, and Handouts Education comprehension: verbalized understanding and returned demonstration   GOALS: Goals reviewed with patient? Yes    SHORT TERM GOALS: *Updated at recert* Target date: 31/51/7616  Pt will be independent with HEP progression for improved balance, strength, gait. Baseline: Goal status: IN PROGRESS  2.  Pt will improve 5x sit<>stand to less than or equal to 45 sec to demonstrate improved functional strength and transfer efficiency. Baseline: 56.29 sec 10/21/2022 Goal status: IN PROGRESS  LONG TERM GOALS: Target date: 12/03/2022  Pt will be independent with final HEP for improved strength, balance, transfers, and gait. Baseline:  Goal status: IN PROGRESS  2.  Pt will improve 5x sit<>stand to less than or equal to 30 sec to demonstrate improved functional strength and transfer efficiency. Baseline: 56.29 sec Goal status: IN PROGRESS  3.  Pt will improve TUG score to less than or equal to 20 sec for decreased fall risk. Baseline: 39.91 sec 10/21/2022 Goal status: IN PROGRESS  4.  Pt will improve Berg score to at least 40/56 to decrease fall risk. Baseline: 32/56 Goal status: IN PROGRESS    ASSESSMENT:  CLINICAL IMPRESSION: Pt presents to PT session today reporting  fatigue, but overall reports she does feel better with recent addition of iron medication from MD.  She is able to perform varied stance positions, including Romberg, partial tandem and SLS with added head motions to some of those positions today.  Quarter turns bring on the most unsteadiness and dizziness symptoms, but better with second set compared to first.  She is tolerating  gradual additional movement patterns and balance challenges well in PT sessions, but she does report with daily/functional/real life activities with distractions, these things are harder.  She will benefit from continued skilled PT towards goals for improved balance and decreased fall risk.  OBJECTIVE IMPAIRMENTS Abnormal gait, decreased activity tolerance, decreased balance, decreased mobility, decreased strength, dizziness, impaired flexibility, and postural dysfunction.   ACTIVITY LIMITATIONS bending, standing, squatting, stairs, transfers, bed mobility, and locomotion level  PARTICIPATION LIMITATIONS: meal prep, cleaning, interpersonal relationship, and community activity  PERSONAL FACTORS 1-2 comorbidities: neuropathy, h/o vertigo  are also affecting patient's functional outcome.   REHAB POTENTIAL: Good  CLINICAL DECISION MAKING: Stable/uncomplicated  EVALUATION COMPLEXITY: Low   PLAN: PT FREQUENCY:1x/wk   PT DURATION: 6 weeks-per recert 81/06/5908  PLANNED INTERVENTIONS: Therapeutic exercises, Therapeutic activity, Neuromuscular re-education, Balance training, Gait training, Patient/Family education, Self Care, Joint mobilization, Vestibular training, Canalith repositioning, and Manual therapy  PLAN FOR NEXT SESSION:  Update HEP for varied narrow stance foot positions and quarter turns for habituation; continue to progress balance exercises-sidestepping, forward/back gait, turns.  Continue to work on adding habituation HEP, retest positional vertigo as needed.      Mady Haagensen, PT 11/04/22 12:38 PM Phone: [phone] Fax: [phone]  [facility] at [facility] [place], [address], [place] [address] Phone # [phone] Fax # [phone]

## 2022-11-09 ENCOUNTER — Telehealth: Payer: Self-pay | Admitting: Neurology

## 2022-11-09 NOTE — Telephone Encounter (Signed)
Talked to patient about if she has contacted the PCP ED or urgent Care patient said she has an appointment on Thursday with Pcp. I did let patient if it gets worse before then to go tot an urgent care but we here at Neurology are unable to help with the [facility] bones

## 2022-11-09 NOTE — Telephone Encounter (Signed)
Pt called in stating she hit her shin bones on both of her legs and has been dealing with it for the last several weeks. She is afraid they are getting infected.

## 2022-11-11 DIAGNOSIS — R54 Age-related physical debility: Secondary | ICD-10-CM | POA: Diagnosis not present

## 2022-11-11 DIAGNOSIS — S8011XA Contusion of right lower leg, initial encounter: Secondary | ICD-10-CM | POA: Diagnosis not present

## 2022-11-12 ENCOUNTER — Other Ambulatory Visit: Payer: Self-pay | Admitting: Internal Medicine

## 2022-11-12 ENCOUNTER — Ambulatory Visit
Admission: RE | Admit: 2022-11-12 | Discharge: 2022-11-12 | Disposition: A | Discharge status: Home / Self Care | Payer: Medicare HMO | Source: Ambulatory Visit | Attending: Internal Medicine | Admitting: Internal Medicine

## 2022-11-12 DIAGNOSIS — S8011XA Contusion of right lower leg, initial encounter: Secondary | ICD-10-CM

## 2022-11-12 DIAGNOSIS — S81801A Unspecified open wound, right lower leg, initial encounter: Secondary | ICD-10-CM | POA: Diagnosis not present

## 2022-11-16 ENCOUNTER — Encounter (HOSPITAL_BASED_OUTPATIENT_CLINIC_OR_DEPARTMENT_OTHER): Payer: Self-pay | Admitting: Pulmonary Disease

## 2022-11-16 ENCOUNTER — Ambulatory Visit (INDEPENDENT_AMBULATORY_CARE_PROVIDER_SITE_OTHER): Payer: Medicare HMO | Admitting: Pulmonary Disease

## 2022-11-16 ENCOUNTER — Other Ambulatory Visit: Payer: Self-pay | Admitting: Gastroenterology

## 2022-11-16 VITALS — BP 105/66 | HR 67 | Temp 97.8°F | Ht 64.0 in | Wt 79.2 lb

## 2022-11-16 DIAGNOSIS — J479 Bronchiectasis, uncomplicated: Secondary | ICD-10-CM | POA: Diagnosis not present

## 2022-11-16 DIAGNOSIS — J432 Centrilobular emphysema: Secondary | ICD-10-CM | POA: Diagnosis not present

## 2022-11-16 MED ORDER — STIOLTO RESPIMAT 2.5-2.5 MCG/ACT IN AERS: 1.0000 | INHALATION_SPRAY | Freq: Every day | RESPIRATORY_TRACT | 5 refills | Status: DC

## 2022-11-16 NOTE — Patient Instructions (Signed)
Stiolto 1 puff daily  Follow up in 4 months

## 2022-11-16 NOTE — Progress Notes (Signed)
[facility]  Chief Complaint  Patient presents with   Follow-up    Pt states she is feeling better since LOV with Tammy Parrett.     Constitutional:  BP 105/66 (BP Location: Right Arm, Patient Position: Sitting, Cuff Size: Normal)   Pulse 67   Temp 97.8 F (36.6 C) (Oral)   Ht '5\' 4"'$  (1.626 m)   Wt 79 lb 3.2 oz (35.9 kg)   SpO2 99%   BMI 13.59 kg/m   Past Medical History:  OA, Esophageal dysmotility, Nutcracker esophagus, Gastroparesis, GERD, Iron deficiency anemia, Neuropathy, Raynaud's  Past Surgical History:  She  has a past surgical history that includes Cholecystectomy (2005); Breast lumpectomy; Total abdominal hysterectomy; Lung biopsy; Cataract extraction; Colonoscopy; Upper gastrointestinal endoscopy; Esophageal manometry (N/A, 10/06/2015); 24 hour ph study (N/A, 10/13/2015); Esophageal manometry (N/A, 03/10/2018); Esophageal manometry (N/A, 10/15/2020); Esophagogastroduodenoscopy (egd) with propofol (N/A, 08/02/2022); Foreign Body Removal (08/02/2022); and Savory dilation (N/A, 08/02/2022).  Brief Summary:  [name] is a 82 y.o. female former smoker with bronchiectasis, recurrent aspiration pneumonia with history of reflux, emphysema and history of cryptogenic organizing pneumonia.      Subjective:   She saw Tammy Parrett in November.  Needed course of augmentin.  CXR at the time was negative for pneumonia.  She has improved since then, but still doesn't feel like her breathing is back to normal.  She is having more trouble with her balance and neuropathy also.  She has cough with yellow sputum, and gets dizzy when she has coughing spells sometimes.  She has fallen twice in the last few months.  Physical Exam:   Appearance - well kempt, thin  ENMT - no sinus tenderness, no oral exudate, no LAN, Mallampati 2 airway, no stridor  Respiratory - decreased breath sounds bilaterally, no wheezing or rales  CV - s1s2 regular rate  and rhythm, no murmurs  Ext - 1+ edema  Skin - multiple areas of ecchymosis  Psych - normal mood and affect      Pulmonary testing:  VATS lung biopsy 2003 >> BOOP PFT 12/31/09 >> FEV1 1.84(84%), FEV1% 73, TLC 4.94(98%), DLCO 77%, +BD. PFT 06/21/16 >> FEV1 1.22 (55%), FEV1% 76, TLC 5.29 (104%), DLCO 43% PFT 12/21/16 >> FEV1 1.49 (69%), FEV1% 78, TLC 5.42 (107%), RV 158%, DLCO 51%  Chest Imaging:  CT chest 08/17/10 >> mild biapical scarring, basilar peribronchovascular nodularity and mild bronchiectasis HRCT chest 11/28/17 >> mild centrilobular emphysema, peribronchovascular nodularity, mild BTX, LLL consolidation CT chest 03/02/18 >> mild emphysema, areas of BTX, several nodular areas up to 1.4 cm CT chest 08/25/18 >> centrilobular emphysema, BTX with wall thickening and tree in bud RML/lingula/bilateral lower lobes, unchanged opacity LLL, new patchy opacities LUL  HRCT chest 09/21/19 >> apical scarring, diffuse BTX with peribronchovascular nodularity, areas of mucoid impaction, coronary calcification  Cardiac Tests:  Echo 10/08/19 >> EF 60 to 65%, mild LVH, grade 2 DD, mild MR, mild TR  Social History:  She  reports that she quit smoking about 54 years ago. Her smoking use included cigarettes. She has a 2.50 pack-year smoking history. She has never used smokeless tobacco. She reports that she does not drink alcohol and does not use drugs.  Family History:  Her family history includes Cancer in her maternal grandmother; Heart disease in her father and paternal grandfather; Leukemia in her paternal grandmother; Lung cancer in her paternal uncle; Lymphoma in her mother.     Assessment/Plan:   Bronchiectasis, centrilobular  emphysema. - she is unable to tolerate albuterol, hypertonic saline, or flutte valve - previously tried spiriva respimat and symbicort but had trouble tolerating these - she is willing to try inhaler therapy again; will try her on stiolto 1 puff daily - continue  prn mucinex, tessalon   Peripheral neuropathy. - followed by Dr. Delice Lesch with [facility]   Raynaud's phenomenon. - followed by [facility]ology at [facility]   GERD with dysphagia. - she will schedule follow up with Dr. Havery Moros with [facility]   Cachexia. - discussed options to keep up with caloric and protein intake  Time Spent Involved in Patient Care on Day of Examination:  36 minutes  Follow up:   Patient Instructions  Stiolto 1 puff daily  Follow up in 4 months  Medication List:   Allergies as of 11/16/2022       Reactions   Prednisone    Does not remember reaction    Sulfa Antibiotics    Does not remember reaction    Cortisone Other (See Comments)   Does not remember reaction    Sulfonamide Derivatives Other (See Comments)   Does not remember reaction         Medication List        Accurate as of November 16, 2022  1:50 PM. If you have any questions, ask your nurse or doctor.          STOP taking these medications    amoxicillin-clavulanate 875-125 MG tablet Commonly known as: AUGMENTIN Stopped by: Chesley Mires, MD   Dexilant 60 MG capsule Generic drug: dexlansoprazole Stopped by: Yetta Flock, MD   pentoxifylline 400 MG CR tablet Commonly known as: TRENTAL Stopped by: Chesley Mires, MD       TAKE these medications    acetaminophen 500 MG tablet Commonly known as: TYLENOL Take 500 mg by mouth daily as needed (pain).   benzonatate 200 MG capsule Commonly known as: TESSALON Take 1 capsule (200 mg total) by mouth 3 (three) times daily as needed for cough.   famotidine 20 MG tablet Commonly known as: Pepcid Take 1 tablet (20 mg total) by mouth 2 (two) times daily.   guaiFENesin 600 MG 12 hr tablet Commonly known as: MUCINEX Take 600 mg by mouth 2 (two) times daily as needed for to loosen phlegm or cough.   omeprazole 40 MG capsule Commonly known as: PRILOSEC TAKE ONE CAPSULE BY MOUTH TWICE DAILY   Stiolto  Respimat 2.5-2.5 MCG/ACT Aers Generic drug: Tiotropium Bromide-Olodaterol Inhale 1 puff into the lungs daily. Started by: Chesley Mires, MD   sucralfate 1 g tablet Commonly known as: CARAFATE Take 1 tablet (1 g total) by mouth every 6 (six) hours as needed. Slowly dissolve 1 tablet in 1 Tbs of water before ingesting   Viactiv Calcium Plus D 650-12.5-40 MG-MCG-MCG Chew Generic drug: Calcium-Vitamin D-Vitamin K Chew 1 each by mouth daily.        Signature:  Chesley Mires, MD [place] Pager - [phone] 11/16/2022, 1:50 PM

## 2022-11-17 ENCOUNTER — Ambulatory Visit: Payer: Medicare HMO | Admitting: Podiatry

## 2022-11-22 ENCOUNTER — Encounter: Payer: Self-pay | Admitting: Physical Therapy

## 2022-11-22 ENCOUNTER — Ambulatory Visit: Payer: Medicare HMO | Attending: Neurology | Admitting: Physical Therapy

## 2022-11-22 DIAGNOSIS — R2681 Unsteadiness on feet: Secondary | ICD-10-CM | POA: Diagnosis not present

## 2022-11-22 DIAGNOSIS — R42 Dizziness and giddiness: Secondary | ICD-10-CM | POA: Diagnosis not present

## 2022-11-22 NOTE — Therapy (Signed)
OUTPATIENT PHYSICAL THERAPY VESTIBULAR TREATMENT NOTE     Patient Name: [name] MRN: [id number] DOB:06-04-1941, 82 y.o., female Today's Date: 11/22/2022  PCP: [name], MD REFERRING PROVIDER: Ellouise Newer, MD     PT End of Session - 11/22/22 1448     Visit Number 10   *PN completed at visit 7   Number of Visits 16    Date for PT Re-Evaluation 12/03/22    Authorization Type aetna medicare    Progress Note Due on Visit 17   completed at visit 7   PT Start Time 1449    PT Stop Time 1527    PT Time Calculation (min) 38 min    Equipment Utilized During Treatment Gait belt    Activity Tolerance Patient tolerated treatment well;Other (comment)   mild c/o dizziness   Behavior During Therapy WFL for tasks assessed/performed               Past Medical History:  Diagnosis Date   Allergy    Arthritis    Atherosclerosis of aorta ([place]) 2019   ct   BOOP (bronchiolitis obliterans with organizing pneumonia) ([place]) 2003   Bronchiectasis    Cataract    Esophageal dysmotility    Esophageal ulcer    Gastroparesis    GERD (gastroesophageal reflux disease)    severe   Headache    migraines   Hiatal hernia    neuropathy right foot and leg   Iron deficiency anemia    Macular degeneration (senile) of retina    Neuropathy    Nutcracker esophagus    Osteopenia    Pneumonia    Pulmonary fibrosis ([place])    Raynaud's disease    Recurrent pneumonia    Due to aspiration   Thrombocytosis    Past Surgical History:  Procedure Laterality Date   82 HOUR [place] STUDY N/A 10/13/2015   Procedure: 24 HOUR [place] STUDY;  Surgeon: Gatha Mayer, MD;  Location: [facility];  Service: Endoscopy;  Laterality: N/A;   BREAST LUMPECTOMY     left, x 2   CATARACT EXTRACTION     both eyes   CHOLECYSTECTOMY  2005   COLONOSCOPY     ESOPHAGEAL MANOMETRY N/A 10/06/2015   Procedure: ESOPHAGEAL MANOMETRY (EM);  Surgeon: Manus Gunning, MD;  Location: [facility];  Service:  Gastroenterology;  Laterality: N/A;   ESOPHAGEAL MANOMETRY N/A 03/10/2018   Procedure: ESOPHAGEAL MANOMETRY (EM);  Surgeon: Mauri Pole, MD;  Location: [facility];  Service: Endoscopy;  Laterality: N/A;   ESOPHAGEAL MANOMETRY N/A 10/15/2020   Procedure: ESOPHAGEAL MANOMETRY (EM);  Surgeon: Lavena Bullion, DO;  Location: [facility];  Service: Gastroenterology;  Laterality: N/A;   ESOPHAGOGASTRODUODENOSCOPY (EGD) WITH PROPOFOL N/A 08/02/2022   Procedure: ESOPHAGOGASTRODUODENOSCOPY (EGD) WITH PROPOFOL;  Surgeon: Yetta Flock, MD;  Location: [facility];  Service: Gastroenterology;  Laterality: N/A;   FOREIGN BODY REMOVAL  08/02/2022   Procedure: FOREIGN BODY REMOVAL;  Surgeon: Yetta Flock, MD;  Location: [facility];  Service: Gastroenterology;;   LUNG BIOPSY     vats right 2003   SAVORY DILATION N/A 08/02/2022   Procedure: SAVORY DILATION;  Surgeon: Yetta Flock, MD;  [facility];  Service: Gastroenterology;  Laterality: N/A;   TOTAL ABDOMINAL HYSTERECTOMY     UPPER GASTROINTESTINAL ENDOSCOPY     Patient Active Problem List   Diagnosis Date Noted   Bilateral dry eyes 01/07/2022   Pain in both lower extremities 12/23/2021   Purple toe syndrome  of both feet ([place]) 12/23/2021   Iron deficiency anemia 09/25/2021   Senile purpura ([place]) 09/25/2021   Syndrome of inappropriate secretion of antidiuretic hormone ([place]) 09/25/2021   Macular degeneration 03/18/2021   Choroidal nevus, left eye 01/01/2021   Early stage nonexudative age-related macular degeneration of both eyes 01/01/2021   Thrombocytosis 12/08/2020   Bronchiolectasis ([place]) 11/27/2020   Hardening of the aorta (main artery of the heart) ([place]) 11/27/2020   Idiopathic progressive polyneuropathy 11/27/2020   Intestinal malabsorption 11/27/2020   Malnutrition of mild degree Altamease Oiler: 75% to less than 90% of standard weight) ([place]) 11/27/2020   Pulmonary fibrosis ([place]) 11/27/2020   Urge  incontinence of urine 11/27/2020   Protein calorie malnutrition ([place]) 11/24/2020   Left epiretinal membrane 04/01/2020   Posterior vitreous detachment of both eyes 04/01/2020   Burning sensation of feet 02/17/2020   Venous insufficiency 01/02/2019   Carpal tunnel syndrome of right wrist 11/24/2018   Sore throat 12/08/2017   Hoarseness 12/08/2017   Abnormal CT scan 12/06/2017   Laryngopharyngeal reflux (LPR) 10/03/2017   Temporomandibular jaw dysfunction 10/03/2017   Tension-type headache, not intractable 09/12/2017   Abdominal pain 04/28/2017   Other fatigue 04/28/2017   Pneumonia 12/14/2016   Neuropathy 07/30/2016   TIA (transient ischemic attack) 06/22/2016   New onset of headaches after age 35 06/22/2016   Dysphagia    Allergic reaction 01/11/2015   Arthritis of right ankle 10/09/2013   Pes cavus 10/09/2013   Loss of transverse plantar arch 10/09/2013   Allergic rhinitis 05/14/2013   BRONCHIECTASIS 12/15/2010   Nutcracker esophagus 01/26/2008   GERD 01/26/2008   GASTROPARESIS 01/26/2008    ONSET DATE: 08/16/2022  REFERRING DIAG: vertigo  THERAPY DIAG:  Dizziness and giddiness  Unsteadiness on feet  Rationale for Evaluation and Treatment Rehabilitation  SUBJECTIVE:   SUBJECTIVE STATEMENT: Shins are bothering me with pain, bruising.  Would like to keep working on strength in legs.  My dizziness seems less in the past few days. Pt accompanied by: self  PERTINENT HISTORY: h/o neuropathy, h/o chronic venous insufficiency.     PAIN:  Are you having pain? No and tense," my body is sore because I'm tense"  PRECAUTIONS: Fall  WEIGHT BEARING RESTRICTIONS No  FALLS: Has patient fallen in last 6 months? Yes. Number of falls 2  major falls with frequent unsteadiness and hold onto surrounding support  LIVING ENVIRONMENT: Lives with: lives with their family and lives alone Lives in: House/apartment Stairs: No Has following equipment at home: Gilford Rile - 4 wheeled and  cane  PLOF: Independent and noting declining strength.  This has worsened in the past 6-7 months.   PATIENT GOALS Get my head straightened out, get stronger.  OBJECTIVE:    TODAY'S TREATMENT: 11/22/2022 Activity Comments  5x sit<>stand: 65.32 sec with UE support Good form, slowed pace; pt reports feeling scared with this activity  Side step/together R and L 2 x10 reps   Heel/toe raises 2 x 10 reps Reports dizziness as 7/10 after these activities and stands steady for 1-2 minutes  Minisquat to up on toes x 8 reps   Quarter turns 3 reps to R, 3 reps to L, 2 Rates dizziness as 7/10   Sit<>Stand 4 x 5 reps, with cues for head forward, positioning for ease of transfers. One set timed:  59 seconds Pt reports improved ease of transfers with cues for visual target and for "hinge" at hips  Seated upper body/postural strengthening:  scapular squeezes, then alternate scapular squeeze/forward punch  Access Code: [email or web address] URL: [email or web address] Date: 11/22/2022-addition Prepared by: [facility]  Program Notes Standing beside counter and hold for support.  Step forward then back, 10 times on your right leg.  Then repeat stepping forward then back, 10 times on your left leg.  Exercises - Seated Toe Raise  - 1 x daily - 7 x weekly - 1-2 sets - 10 reps - 3 sec hold - Seated Long Arc Quad  - 1 x daily - 7 x weekly - 1-2 sets - 10 reps - 3 sec hold - Staggered Stance Forward Backward Weight Shift with Counter Support  - 1 x daily - 7 x weekly - 1-2 sets - 10 reps - Standing Marching  - 1 x daily - 5 x weekly - 3 sets - 10 reps - 3 sec hold - Standing Gaze Stabilization with Head Rotation  - 1-2 x daily - 7 x weekly - 1 sets - 10 reps - 30 sec hold - Heel Toe Raises with Counter Support  - 1 x daily - 5 x weekly - 3 sets - 10 reps - Side Stepping with Counter Support  - 1 x daily - 5 x weekly - 2 sets - 10 reps - Sit to Stand with Armchair  - 1 x  daily - 7 x weekly - 3 sets - 5 reps  PATIENT EDUCATION: Education details: HEP addition-sit<>stand technique Person educated: Patient Education method: Consulting civil engineer, Demonstration, and Handouts Education comprehension: verbalized understanding and returned demonstration    ---------------------------------------------------------------------------------- Objective measures below taken at time of eval: DIAGNOSTIC FINDINGS: EMG at [facility] showing mild sensory neuropathy and negative punch biopsy for small fiber neuropathy, no evidence of vasculitis  COGNITION: Overall cognitive status: Within functional limits for tasks assessed   SENSATION: Light touch: Impaired  prickly sensation in her legs with light touch  EDEMA: Note mild edema in feet  POSTURE: rounded shoulders and forward head   Cervical ROM:  WFLs  STRENGTH: Weakness noted with rising from a chair.  *TBA further  LOWER EXTREMITY MMT:   MMT Right eval Left eval  Hip flexion TBA TBA  Hip abduction    Hip adduction    Hip internal rotation    Hip external rotation    Knee flexion    Knee extension    Ankle dorsiflexion    Ankle plantarflexion    Ankle inversion    Ankle eversion    (Blank rows = not tested)  BED MOBILITY:  Able to move sit<>supine, but does require increased time, and provokes dizziness  TRANSFERS: Sit to stand: Modified independence  requires several attempts Stand to sit: Modified independence  GAIT: Gait pattern: decreased stride length Distance walked: 100 ft  Assistive device utilized: Single point cane Level of assistance: Modified independence  FUNCTIONAL TESTs:  5 times sit to stand: Unable to perform 5 times. *note a weak sounding cough that is not effective to clear mucous-- may add some inspiratory/expiratory breathing to HEP  PATIENT SURVEYS:  FOTO not done due to 2 referrals for different conditions   VESTIBULAR ASSESSMENT   GENERAL OBSERVATION: The patient  has difficulty rising to stand from a chair in the waiting room.  She walks into the clinic with a SPC mod independently.     SYMPTOM BEHAVIOR:   Subjective history: h/o multi-year sensation of spinning and imbalance "scared to get up quickly."   Non-Vestibular symptoms:  weakness   Type of dizziness: Spinning/Vertigo, Unsteady  with head/body turns, and Lightheadedness/Faint   Frequency: daily    Duration: constant   Aggravating factors: Spontaneous, Induced by position change: sit to stand, Worse in the morning, and Occurs when standing still    Relieving factors: no known relieving factors   Progression of symptoms: worse   OCULOMOTOR EXAM:   Ocular Alignment: normal   Ocular ROM: No Limitations   Spontaneous Nystagmus: absent   Gaze-Induced Nystagmus: absent   Smooth Pursuits: intact   Saccades: intact   Lenswear:  progressive lenses worn at all times when up   VESTIBULAR - OCULAR REFLEX:    Slow VOR: Comment: provokes dizziness with slow VOR after providing demo and tactile cues for speed.  Provokes 8/10 symptoms.   VOR Cancellation: Normal   Head-Impulse Test: HIT Right: positive HIT Left:  negative for corrective saccade.   Dynamic Visual Acuity:  TBA    POSITIONAL TESTING: Worse when rising from sitting or supine.  Describes a sensation of 'weakness".  She does not get spinning with rolling or moving sit>sidelying > supine.    Sit>Sidelying:  R is positive for spinning dizziness x 1-2 minutes, no nystagmus viewed.  To L provokes a sensation of spinning dizziness x 30 seconds.  "My  head is spinning, but the room stays still".  Returning to sitting provokes a sensation of spinning and lightheadedness.   MOTION SENSITIVITY:    Motion Sensitivity Quotient  Intensity: 0 = none, 1 = Lightheaded, 2 = Mild, 3 = Moderate, 4 = Severe, 5 = Vomiting  Intensity  1. Sitting to supine   2. Supine to L side   3. Supine to R side   4. Supine to sitting   5. L Hallpike-Dix   6. Up  from L    7. R Hallpike-Dix   8. Up from R    9. Sitting, head  tipped to L knee   10. Head up from L  knee   11. Sitting, head  tipped to R knee   12. Head up from R  knee   13. Sitting head turns x5 4  14.Sitting head nods x5 4 "that's when I'd fall"  15. In stance, 180  turn to L    16. In stance, 180  turn to R     OTHOSTATICS: TBA  FUNCTIONAL GAIT:  5 time sit to stand:  unable to perform.   VESTIBULAR TREATMENT:  Gaze Adaptation:   x1 Viewing Horizontal: Position: seated, Reps: 5, and Comment: provokes dizziness to 8/10  PATIENT EDUCATION: Education details: patient Person educated: Patient Education method: Explanation, Demonstration, and Handouts Education comprehension: verbalized understanding and returned demonstration   GOALS: Goals reviewed with patient? Yes    SHORT TERM GOALS: *Updated at recert* Target date: 71/04/2693  Pt will be independent with HEP progression for improved balance, strength, gait. Baseline: Goal status: GOAL MET, per pt report and demo recent exercises  2.  Pt will improve 5x sit<>stand to less than or equal to 45 sec to demonstrate improved functional strength and transfer efficiency. Baseline: 56.29 sec 10/21/2022>59 sec at best 11/22/2022 Goal status: GOAL NOT MET  LONG TERM GOALS: Target date: 12/03/2022  Pt will be independent with final HEP for improved strength, balance, transfers, and gait. Baseline:  Goal status: IN PROGRESS  2.  Pt will improve 5x sit<>stand to less than or equal to 30 sec to demonstrate improved functional strength and transfer efficiency. Baseline: 56.29 sec Goal status: IN PROGRESS  3.  Pt will  improve TUG score to less than or equal to 20 sec for decreased fall risk. Baseline: 39.91 sec 10/21/2022 Goal status: IN PROGRESS  4.  Pt will improve Berg score to at least 40/56 to decrease fall risk. Baseline: 32/56 Goal status: IN PROGRESS    ASSESSMENT:  CLINICAL IMPRESSION: Pt presents  to OPPT today, not being seen for PT since 11/04/22, due to the holidays.  Assessed STGs, with pt meeting 1 of 2 STGs.  She is doing her exercises and demo understanding of most recent additions. Despite having good form with sit<>stand, she takes increased time compared to last check and admits she is very fearful with sit<>stand, given hx of dizziness and unsteadiness.  In session today, focused on mechanics of sit<>stand, with cues for forward lean and look at visual target as she stands up.  She is able to perform transfers with improved ease and reported improved confidence with this technique.  She has not had any reported falls; with standing activities today, she is able to tolerate additional set of quarter turns and overall standing activities with slightly less c/o dizziness (7/10) compared to last session (8/10).    OBJECTIVE IMPAIRMENTS Abnormal gait, decreased activity tolerance, decreased balance, decreased mobility, decreased strength, dizziness, impaired flexibility, and postural dysfunction.   ACTIVITY LIMITATIONS bending, standing, squatting, stairs, transfers, bed mobility, and locomotion level  PARTICIPATION LIMITATIONS: meal prep, cleaning, interpersonal relationship, and community activity  PERSONAL FACTORS 1-2 comorbidities: neuropathy, h/o vertigo  are also affecting patient's functional outcome.   REHAB POTENTIAL: Good  CLINICAL DECISION MAKING: Stable/uncomplicated  EVALUATION COMPLEXITY: Low   PLAN: PT FREQUENCY:1x/wk   PT DURATION: 6 weeks-per recert 62/11/3084  PLANNED INTERVENTIONS: Therapeutic exercises, Therapeutic activity, Neuromuscular re-education, Balance training, Gait training, Patient/Family education, Self Care, Joint mobilization, Vestibular training, Canalith repositioning, and Manual therapy  PLAN FOR NEXT SESSION:  Add quarter turns to HEP; review sit<>stand; add any upper body exercises to HEP that pt feels are appropriate (per pt request).  Need  to assess LTGs and discuss plans for discharge or renew.      Mady Haagensen, PT 11/22/22 5:14 PM Phone: [phone] Fax: [phone]  [facility] at [facility] [address], [address], [place] [address] Phone # [phone] Fax # [phone]

## 2022-11-29 ENCOUNTER — Ambulatory Visit: Payer: Medicare HMO | Admitting: Physical Therapy

## 2022-11-29 DIAGNOSIS — N3941 Urge incontinence: Secondary | ICD-10-CM | POA: Diagnosis not present

## 2022-11-29 DIAGNOSIS — Z882 Allergy status to sulfonamides status: Secondary | ICD-10-CM | POA: Diagnosis not present

## 2022-11-29 DIAGNOSIS — Z87891 Personal history of nicotine dependence: Secondary | ICD-10-CM | POA: Diagnosis not present

## 2022-11-29 DIAGNOSIS — M1990 Unspecified osteoarthritis, unspecified site: Secondary | ICD-10-CM | POA: Diagnosis not present

## 2022-11-29 DIAGNOSIS — J8410 Pulmonary fibrosis, unspecified: Secondary | ICD-10-CM | POA: Diagnosis not present

## 2022-11-29 DIAGNOSIS — R69 Illness, unspecified: Secondary | ICD-10-CM | POA: Diagnosis not present

## 2022-11-29 DIAGNOSIS — Z681 Body mass index (BMI) 19 or less, adult: Secondary | ICD-10-CM | POA: Diagnosis not present

## 2022-11-29 DIAGNOSIS — Z809 Family history of malignant neoplasm, unspecified: Secondary | ICD-10-CM | POA: Diagnosis not present

## 2022-11-29 DIAGNOSIS — R636 Underweight: Secondary | ICD-10-CM | POA: Diagnosis not present

## 2022-11-29 DIAGNOSIS — K200 Eosinophilic esophagitis: Secondary | ICD-10-CM | POA: Diagnosis not present

## 2022-11-29 DIAGNOSIS — K219 Gastro-esophageal reflux disease without esophagitis: Secondary | ICD-10-CM | POA: Diagnosis not present

## 2022-11-29 DIAGNOSIS — Z9181 History of falling: Secondary | ICD-10-CM | POA: Diagnosis not present

## 2022-11-30 ENCOUNTER — Ambulatory Visit (INDEPENDENT_AMBULATORY_CARE_PROVIDER_SITE_OTHER): Payer: Medicare HMO | Admitting: Emergency Medicine

## 2022-11-30 ENCOUNTER — Encounter: Payer: Self-pay | Admitting: Emergency Medicine

## 2022-11-30 ENCOUNTER — Ambulatory Visit (INDEPENDENT_AMBULATORY_CARE_PROVIDER_SITE_OTHER): Payer: Medicare HMO

## 2022-11-30 VITALS — BP 108/64 | HR 72 | Ht 64.0 in | Wt 78.8 lb

## 2022-11-30 DIAGNOSIS — R059 Cough, unspecified: Secondary | ICD-10-CM

## 2022-11-30 DIAGNOSIS — J479 Bronchiectasis, uncomplicated: Secondary | ICD-10-CM | POA: Diagnosis not present

## 2022-11-30 DIAGNOSIS — J449 Chronic obstructive pulmonary disease, unspecified: Secondary | ICD-10-CM | POA: Diagnosis not present

## 2022-11-30 MED ORDER — LEVOFLOXACIN 500 MG PO TABS: 500.0000 mg | ORAL_TABLET | Freq: Every day | ORAL | 0 refills | Status: DC

## 2022-11-30 NOTE — Assessment & Plan Note (Signed)
Symptoms consistent with an acute flare of her bronchiectasis with purulent sputum.  Could consider obtaining culture data although she is not sure she can provide it.  She never started the Stiolto -would probably benefit from being on a scheduled bronchodilator to help with secretion clearance.  She also does not tolerate any other secretion management techniques.  We will do a chest x-ray today.  I believe she needs to be treated for acute flare, we will give her levofloxacin.  This is her second flare in 3 months.  We will arrange for a high-resolution CT scan of the chest so that Dr. Halford Chessman can look for underlying progression of her bronchiectasis, any evidence for [facility] etc.  We can hold off on starting this Stiolto for now.  You should talk to Dr. Halford Chessman about possibly starting it going forward Chest x-ray today Please take levofloxacin 500 mg once daily for 7 days. We will arrange for a high-resolution CT scan of the chest Follow with Dr. Halford Chessman in about 1 month to review how you are feeling after treatment, to review your CT scan of the chest and talk about possibly getting back on inhaler medication.

## 2022-11-30 NOTE — Addendum Note (Signed)
Addended by: Lorretta Harp on: 11/30/2022 02:25 PM   Modules accepted: Orders

## 2022-11-30 NOTE — Patient Instructions (Signed)
We can hold off on starting this Stiolto for now.  You should talk to Dr. Halford Chessman about possibly starting it going forward Chest x-ray today Please take levofloxacin 500 mg once daily for 7 days. We will arrange for a high-resolution CT scan of the chest Follow with Dr. Halford Chessman in about 1 month to review how you are feeling after treatment, to review your CT scan of the chest and talk about possibly getting back on inhaler medication.

## 2022-11-30 NOTE — Progress Notes (Signed)
Subjective:    Patient ID: [name], female    DOB: 1941-03-06, 82 y.o.   MRN: [id number]  HPI Acute office visit 11/30/2022 --  82 year old woman, history of tobacco, bronchiectasis and centrilobular emphysema.  She apparently has had difficulty tolerating bronchodilator therapy, was just started on Stiolto about 2 weeks ago.  She was treated for an acute bronchiectatic flare with Augmentin in November.  She has a history of Raynaud's phenomenon, GERD with dysphagia.  Most recent CT chest available to me is from 09/2019  She is here today for cough.  She reports increased cough for about 2 weeks. She does have sputum at baseline, but is seeing increased mucous, purulent - dark tan to brown. She never started the [employer] - she was worried she would get side effects. She does not use albuterol. Unclear what precipitated this flare - no URI exposure, etc. No change in her nasal congestion.    Review of Systems As per HPi  Past Medical History:  Diagnosis Date   Allergy    Arthritis    Atherosclerosis of aorta ([place]) 2019   ct   BOOP (bronchiolitis obliterans with organizing pneumonia) ([place]) 2003   Bronchiectasis    Cataract    Esophageal dysmotility    Esophageal ulcer    Gastroparesis    GERD (gastroesophageal reflux disease)    severe   Headache    migraines   Hiatal hernia    neuropathy right foot and leg   Iron deficiency anemia    Macular degeneration (senile) of retina    Neuropathy    Nutcracker esophagus    Osteopenia    Pneumonia    Pulmonary fibrosis (HCC)    Raynaud's disease    Recurrent pneumonia    Due to aspiration   Thrombocytosis      Family History  Problem Relation Age of Onset   Lymphoma Mother    Heart disease Father    Lung cancer Paternal Uncle    Heart disease Paternal Grandfather    Leukemia Paternal Grandmother    Cancer Maternal Grandmother        unknown type   Colon cancer Neg Hx    Esophageal cancer Neg Hx    Rectal cancer Neg  Hx    Stomach cancer Neg Hx      Social History   Socioeconomic History   Marital status: Widowed    Spouse name: Not on file   Number of children: 2   Years of education: Not on file   Highest education level: Not on file  Occupational History   Occupation: retired Pharmacist, hospital    Comment: still teaching pre K   Tobacco Use   Smoking status: Former    Packs/day: 0.25    Years: 10.00    Total pack years: 2.50    Types: Cigarettes    Quit date: 11/15/1968    Years since quitting: 54.0   Smokeless tobacco: Never  Vaping Use   Vaping Use: Never used  Substance and Sexual Activity   Alcohol use: No   Drug use: No   Sexual activity: Not Currently    Partners: Male  Other Topics Concern   Not on file  Social History Narrative   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   Social Determinants of  Health   Financial Resource Strain: Not on file  Food Insecurity: Not on file  Transportation Needs: Not on file  Physical Activity: Not on file  Stress: Not on file  Social Connections: Not on file  Intimate Partner Violence: Not on file     Allergies  Allergen Reactions   Prednisone     Does not remember reaction    Sulfa Antibiotics     Does not remember reaction    Cortisone Other (See Comments)    Does not remember reaction    Sulfonamide Derivatives Other (See Comments)    Does not remember reaction      Outpatient Medications Prior to Visit  Medication Sig Dispense Refill   acetaminophen (TYLENOL) 500 MG tablet Take 500 mg by mouth daily as needed (pain).     benzonatate (TESSALON) 200 MG capsule Take 1 capsule (200 mg total) by mouth 3 (three) times daily as needed for cough. 90 capsule 3   Calcium-Vitamin D-Vitamin K (VIACTIV CALCIUM PLUS D) 650-12.5-40 MG-MCG-MCG CHEW Chew 1 each by mouth daily.     famotidine (PEPCID) 20 MG tablet Take 1 tablet (20 mg  total) by mouth 2 (two) times daily. 180 tablet 3   guaiFENesin (MUCINEX) 600 MG 12 hr tablet Take 600 mg by mouth 2 (two) times daily as needed for to loosen phlegm or cough.     omeprazole (PRILOSEC) 40 MG capsule TAKE ONE CAPSULE BY MOUTH TWICE DAILY 60 capsule 2   sucralfate (CARAFATE) 1 g tablet Take 1 tablet (1 g total) by mouth every 6 (six) hours as needed. Slowly dissolve 1 tablet in 1 Tbs of water before ingesting 60 tablet 3   Tiotropium Bromide-Olodaterol (STIOLTO RESPIMAT) 2.5-2.5 MCG/ACT AERS Inhale 1 puff into the lungs daily. 1 each 5   No facility-administered medications prior to visit.        Objective:   Physical Exam Vitals:   11/30/22 1355  BP: 108/64  Pulse: 72  SpO2: 97%  Weight: 78 lb 12.8 oz (35.7 kg)  Height: '5\' 4"'$  (1.626 m)   Gen: Pleasant, very thin woman, in no distress, flat affect  ENT: No lesions,  mouth clear,  oropharynx clear, no postnasal drip  Neck: No JVD, no stridor  Lungs: No use of accessory muscles, little air movement.  No significant rhonchi or crackles.  No wheezing  Cardiovascular: RRR, heart sounds normal, no murmur or gallops, no peripheral edema  Musculoskeletal: No deformities, no cyanosis or clubbing  Neuro: alert, awake, non focal  Skin: Warm, no lesions or rash      Assessment & Plan:  Bronchiolectasis (HCC) Symptoms consistent with an acute flare of her bronchiectasis with purulent sputum.  Could consider obtaining culture data although she is not sure she can provide it.  She never started the Stiolto -would probably benefit from being on a scheduled bronchodilator to help with secretion clearance.  She also does not tolerate any other secretion management techniques.  We will do a chest x-ray today.  I believe she needs to be treated for acute flare, we will give her levofloxacin.  This is her second flare in 3 months.  We will arrange for a high-resolution CT scan of the chest so that Dr. Halford Chessman can look for underlying  progression of her bronchiectasis, any evidence for [facility] etc.  We can hold off on starting this Stiolto for now.  You should talk to Dr. Halford Chessman about possibly starting it going forward Chest x-ray today Please take  levofloxacin 500 mg once daily for 7 days. We will arrange for a high-resolution CT scan of the chest Follow with Dr. Halford Chessman in about 1 month to review how you are feeling after treatment, to review your CT scan of the chest and talk about possibly getting back on inhaler medication.   Baltazar Apo, MD, PhD 11/30/2022, 2:20 PM [facility] [phone] or if no answer before 7:00PM call [phone] For any issues after 7:00PM please call eLink [phone]

## 2022-12-02 ENCOUNTER — Telehealth: Payer: Self-pay | Admitting: Pulmonary Disease

## 2022-12-02 NOTE — Telephone Encounter (Signed)
Called the pt and there was no answer  Recent test done was cxr Dr Lamonte Sakai, please advise on cxr, thanks!

## 2022-12-03 ENCOUNTER — Ambulatory Visit: Payer: Medicare HMO | Admitting: Physical Therapy

## 2022-12-03 NOTE — Telephone Encounter (Signed)
Spoke with pt and advised that as soon as Dr. Lamonte Sakai resulted her CXR we would call her. Dr. Lamonte Sakai please advise.

## 2022-12-06 ENCOUNTER — Ambulatory Visit: Payer: Medicare HMO | Admitting: Physical Therapy

## 2022-12-06 ENCOUNTER — Encounter: Payer: Self-pay | Admitting: Physical Therapy

## 2022-12-06 DIAGNOSIS — R42 Dizziness and giddiness: Secondary | ICD-10-CM | POA: Diagnosis not present

## 2022-12-06 DIAGNOSIS — R2681 Unsteadiness on feet: Secondary | ICD-10-CM | POA: Diagnosis not present

## 2022-12-06 NOTE — Therapy (Signed)
OUTPATIENT PHYSICAL THERAPY VESTIBULAR TREATMENT NOTE/RECERT/DISCHARGE SUMMARY     Patient Name: [name] MRN: [id number] DOB:10-09-1941, 82 y.o., female Today's Date: 12/06/2022  PCP: [name], MD REFERRING PROVIDER: Ellouise Newer, MD  PHYSICAL THERAPY DISCHARGE SUMMARY  Visits from Start of Care: 11  Current functional level related to goals / functional outcomes: See LTGs below.  Pt met 1 of 4 LTGs.   Remaining deficits: Balance, dizziness, fear of falling; pt at increased fall risk.   Education / Equipment: Educated in HEP progression.  Educated in fall prevention, benefits of using RW at all times; tried to problem solve through using RW in community, but pt not sure she is able to lift walker in and out of car.   Patient agrees to discharge. Patient goals were not met. Patient is being discharged due to lack of progress.towards goals at this time.  Mady Haagensen, PT 12/06/22 2:58 PM Phone: [phone] Fax: [phone]      PT End of Session - 12/06/22 1409     Visit Number 11    Number of Visits 16    Date for PT Re-Evaluation 42/59/56   RECERT   Authorization Type [employer]etna medicare    Progress Note Due on Visit 53   completed at visit 7   PT Start Time 1409   Pt arrives late   PT Stop Time 1450    PT Time Calculation (min) 41 min    Equipment Utilized During Treatment Gait belt    Activity Tolerance Patient tolerated treatment well    Behavior During Therapy WFL for tasks assessed/performed               Past Medical History:  Diagnosis Date   Allergy    Arthritis    Atherosclerosis of aorta ([place]) 2019   ct   BOOP (bronchiolitis obliterans with organizing pneumonia) ([place]) 2003   Bronchiectasis    Cataract    Esophageal dysmotility    Esophageal ulcer    Gastroparesis    GERD (gastroesophageal reflux disease)    severe   Headache    migraines   Hiatal hernia    neuropathy right foot and leg   Iron deficiency anemia    Macular  degeneration (senile) of retina    Neuropathy    Nutcracker esophagus    Osteopenia    Pneumonia    Pulmonary fibrosis ([place])    Raynaud's disease    Recurrent pneumonia    Due to aspiration   Thrombocytosis    Past Surgical History:  Procedure Laterality Date   67 HOUR [place] STUDY N/A 10/13/2015   Procedure: 24 HOUR [place] STUDY;  Surgeon: Gatha Mayer, MD;  Location: [facility];  Service: Endoscopy;  Laterality: N/A;   BREAST LUMPECTOMY     left, x 2   CATARACT EXTRACTION     both eyes   CHOLECYSTECTOMY  2005   COLONOSCOPY     ESOPHAGEAL MANOMETRY N/A 10/06/2015   Procedure: ESOPHAGEAL MANOMETRY (EM);  Surgeon: Manus Gunning, MD;  Location: [facility];  Service: Gastroenterology;  Laterality: N/A;   ESOPHAGEAL MANOMETRY N/A 03/10/2018   Procedure: ESOPHAGEAL MANOMETRY (EM);  Surgeon: Mauri Pole, MD;  Location: [facility];  Service: Endoscopy;  Laterality: N/A;   ESOPHAGEAL MANOMETRY N/A 10/15/2020   Procedure: ESOPHAGEAL MANOMETRY (EM);  Surgeon: Lavena Bullion, DO;  Location: [facility];  Service: Gastroenterology;  Laterality: N/A;   ESOPHAGOGASTRODUODENOSCOPY (EGD) WITH PROPOFOL N/A 08/02/2022   Procedure: ESOPHAGOGASTRODUODENOSCOPY (EGD) WITH PROPOFOL;  Surgeon: Yetta Flock, MD;  Location: [facility];  Service: Gastroenterology;  Laterality: N/A;   FOREIGN BODY REMOVAL  08/02/2022   Procedure: FOREIGN BODY REMOVAL;  Surgeon: Yetta Flock, MD;  Location: [facility];  Service: Gastroenterology;;   LUNG BIOPSY     vats right 2003   SAVORY DILATION N/A 08/02/2022   Procedure: SAVORY DILATION;  Surgeon: Yetta Flock, MD;  Location: [facility];  Service: Gastroenterology;  Laterality: N/A;   TOTAL ABDOMINAL HYSTERECTOMY     UPPER GASTROINTESTINAL ENDOSCOPY     Patient Active Problem List   Diagnosis Date Noted   Bilateral dry eyes 01/07/2022   Pain in both lower extremities 12/23/2021   Purple toe syndrome of both feet ([place])  12/23/2021   Iron deficiency anemia 09/25/2021   Senile purpura ([place]) 09/25/2021   Syndrome of inappropriate secretion of antidiuretic hormone ([place]) 09/25/2021   Macular degeneration 03/18/2021   Choroidal nevus, left eye 01/01/2021   Early stage nonexudative age-related macular degeneration of both eyes 01/01/2021   Thrombocytosis 12/08/2020   Bronchiolectasis ([place]) 11/27/2020   Hardening of the aorta (main artery of the heart) ([place]) 11/27/2020   Idiopathic progressive polyneuropathy 11/27/2020   Intestinal malabsorption 11/27/2020   Malnutrition of mild degree Altamease Oiler: 75% to less than 90% of standard weight) ([place]) 11/27/2020   Pulmonary fibrosis ([place]) 11/27/2020   Urge incontinence of urine 11/27/2020   Protein calorie malnutrition ([place]) 11/24/2020   Left epiretinal membrane 04/01/2020   Posterior vitreous detachment of both eyes 04/01/2020   Burning sensation of feet 02/17/2020   Venous insufficiency 01/02/2019   Carpal tunnel syndrome of right wrist 11/24/2018   Sore throat 12/08/2017   Hoarseness 12/08/2017   Abnormal CT scan 12/06/2017   Laryngopharyngeal reflux (LPR) 10/03/2017   Temporomandibular jaw dysfunction 10/03/2017   Tension-type headache, not intractable 09/12/2017   Abdominal pain 04/28/2017   Other fatigue 04/28/2017   Pneumonia 12/14/2016   Neuropathy 07/30/2016   TIA (transient ischemic attack) 06/22/2016   New onset of headaches after age 66 06/22/2016   Dysphagia    Allergic reaction 01/11/2015   Arthritis of right ankle 10/09/2013   Pes cavus 10/09/2013   Loss of transverse plantar arch 10/09/2013   Allergic rhinitis 05/14/2013   BRONCHIECTASIS 12/15/2010   Nutcracker esophagus 01/26/2008   GERD 01/26/2008   GASTROPARESIS 01/26/2008    ONSET DATE: 08/16/2022  REFERRING DIAG: vertigo  THERAPY DIAG:  Dizziness and giddiness  Unsteadiness on feet  Rationale for Evaluation and Treatment Rehabilitation  SUBJECTIVE:   SUBJECTIVE  STATEMENT: Balance just not good.  Couldn't get out yesterday.  The dizziness almost made me nauseated.  Used the walker.  I've been extremely cautious and haven't fallen. Pt accompanied by: self  PERTINENT HISTORY: h/o neuropathy, h/o chronic venous insufficiency.     PAIN:  Are you having pain? No and tense," my body is sore because I'm tense"  PRECAUTIONS: Fall  WEIGHT BEARING RESTRICTIONS No  FALLS: Has patient fallen in last 6 months? Yes. Number of falls 2  major falls with frequent unsteadiness and hold onto surrounding support  LIVING ENVIRONMENT: Lives with: lives with their family and lives alone Lives in: House/apartment Stairs: No Has following equipment at home: Gilford Rile - 4 wheeled and cane  PLOF: Independent and noting declining strength.  This has worsened in the past 6-7 months.   PATIENT GOALS Get my head straightened out, get stronger.  OBJECTIVE:    TODAY'S TREATMENT: 12/06/2022 Activity Comments  TUG:  54.78  sec with cane Increased time from 39 sec  5x sit<>stand:  90.13 sec Increased from 65.32 sec  Berg Balance score 31/56 Decreased from 32/56 10/21/22  Verbally reviewed HEP and pt verbalizes understanding and performing daily   Short distance gait trial with RW, x 30 ft with supervision Pt with improved ease of gait, but reports likely to not use in community due to having to lift in and out of car      PATIENT EDUCATION: Education details: Fall prevention education; benefits of using RW; ways to manage dizziness to avoid overdoing activities.  Reviewed objective measures and decreased progress towards goals. Person educated: Patient Education method: Explanation and Verbal cues Education comprehension: verbalized understanding     Access Code: [email or web address] URL: [email or web address] Date: 11/22/2022-addition Prepared by: [facility]  Program Notes Standing beside counter and hold for support.   Step forward then back, 10 times on your right leg.  Then repeat stepping forward then back, 10 times on your left leg.  Exercises - Seated Toe Raise  - 1 x daily - 7 x weekly - 1-2 sets - 10 reps - 3 sec hold - Seated Long Arc Quad  - 1 x daily - 7 x weekly - 1-2 sets - 10 reps - 3 sec hold - Staggered Stance Forward Backward Weight Shift with Counter Support  - 1 x daily - 7 x weekly - 1-2 sets - 10 reps - Standing Marching  - 1 x daily - 5 x weekly - 3 sets - 10 reps - 3 sec hold - Standing Gaze Stabilization with Head Rotation  - 1-2 x daily - 7 x weekly - 1 sets - 10 reps - 30 sec hold - Heel Toe Raises with Counter Support  - 1 x daily - 5 x weekly - 3 sets - 10 reps - Side Stepping with Counter Support  - 1 x daily - 5 x weekly - 2 sets - 10 reps - Sit to Stand with Armchair  - 1 x daily - 7 x weekly - 3 sets - 5 reps    ---------------------------------------------------------------------------------- Objective measures below taken at time of eval: DIAGNOSTIC FINDINGS: EMG at [facility] showing mild sensory neuropathy and negative punch biopsy for small fiber neuropathy, no evidence of vasculitis  COGNITION: Overall cognitive status: Within functional limits for tasks assessed   SENSATION: Light touch: Impaired  prickly sensation in her legs with light touch  EDEMA: Note mild edema in feet  POSTURE: rounded shoulders and forward head   Cervical ROM:  WFLs  STRENGTH: Weakness noted with rising from a chair.  *TBA further  LOWER EXTREMITY MMT:   MMT Right eval Left eval  Hip flexion TBA TBA  Hip abduction    Hip adduction    Hip internal rotation    Hip external rotation    Knee flexion    Knee extension    Ankle dorsiflexion    Ankle plantarflexion    Ankle inversion    Ankle eversion    (Blank rows = not tested)  BED MOBILITY:  Able to move sit<>supine, but does require increased time, and provokes dizziness  TRANSFERS: Sit to stand: Modified  independence  requires several attempts Stand to sit: Modified independence  GAIT: Gait pattern: decreased stride length Distance walked: 100 ft  Assistive device utilized: Single point cane Level of assistance: Modified independence  FUNCTIONAL TESTs:  5 times sit to stand: Unable to perform 5 times. *  note a weak sounding cough that is not effective to clear mucous-- may add some inspiratory/expiratory breathing to HEP  PATIENT SURVEYS:  FOTO not done due to 2 referrals for different conditions   VESTIBULAR ASSESSMENT   GENERAL OBSERVATION: The patient has difficulty rising to stand from a chair in the waiting room.  She walks into the clinic with a SPC mod independently.     SYMPTOM BEHAVIOR:   Subjective history: h/o multi-year sensation of spinning and imbalance "scared to get up quickly."   Non-Vestibular symptoms:  weakness   Type of dizziness: Spinning/Vertigo, Unsteady with head/body turns, and Lightheadedness/Faint   Frequency: daily    Duration: constant   Aggravating factors: Spontaneous, Induced by position change: sit to stand, Worse in the morning, and Occurs when standing still    Relieving factors: no known relieving factors   Progression of symptoms: worse   OCULOMOTOR EXAM:   Ocular Alignment: normal   Ocular ROM: No Limitations   Spontaneous Nystagmus: absent   Gaze-Induced Nystagmus: absent   Smooth Pursuits: intact   Saccades: intact   Lenswear:  progressive lenses worn at all times when up   VESTIBULAR - OCULAR REFLEX:    Slow VOR: Comment: provokes dizziness with slow VOR after providing demo and tactile cues for speed.  Provokes 8/10 symptoms.   VOR Cancellation: Normal   Head-Impulse Test: HIT Right: positive HIT Left:  negative for corrective saccade.   Dynamic Visual Acuity:  TBA    POSITIONAL TESTING: Worse when rising from sitting or supine.  Describes a sensation of 'weakness".  She does not get spinning with rolling or moving sit>sidelying  > supine.    Sit>Sidelying:  R is positive for spinning dizziness x 1-2 minutes, no nystagmus viewed.  To L provokes a sensation of spinning dizziness x 30 seconds.  "My  head is spinning, but the room stays still".  Returning to sitting provokes a sensation of spinning and lightheadedness.   MOTION SENSITIVITY:    Motion Sensitivity Quotient  Intensity: 0 = none, 1 = Lightheaded, 2 = Mild, 3 = Moderate, 4 = Severe, 5 = Vomiting  Intensity  1. Sitting to supine   2. Supine to L side   3. Supine to R side   4. Supine to sitting   5. L Hallpike-Dix   6. Up from L    7. R Hallpike-Dix   8. Up from R    9. Sitting, head  tipped to L knee   10. Head up from L  knee   11. Sitting, head  tipped to R knee   12. Head up from R  knee   13. Sitting head turns x5 4  14.Sitting head nods x5 4 "that's when I'd fall"  15. In stance, 180  turn to L    16. In stance, 180  turn to R     OTHOSTATICS: TBA  FUNCTIONAL GAIT:  5 time sit to stand:  unable to perform.   VESTIBULAR TREATMENT:  Gaze Adaptation:   x1 Viewing Horizontal: Position: seated, Reps: 5, and Comment: provokes dizziness to 8/10  PATIENT EDUCATION: Education details: patient Person educated: Patient Education method: Explanation, Demonstration, and Handouts Education comprehension: verbalized understanding and returned demonstration   GOALS: Goals reviewed with patient? Yes    SHORT TERM GOALS: *Updated at recert* Target date: 35/57/3220  Pt will be independent with HEP progression for improved balance, strength, gait. Baseline: Goal status: GOAL MET, per pt report and demo recent exercises  2.  Pt will improve 5x sit<>stand to less than or equal to 45 sec to demonstrate improved functional strength and transfer efficiency. Baseline: 56.29 sec 10/21/2022>59 sec at best 11/22/2022 Goal status: GOAL NOT MET  LONG TERM GOALS: Target date: 12/03/2022  Pt will be independent with final HEP for improved  strength, balance, transfers, and gait. Baseline:  Goal status: GOAL MET 12/06/2022  2.  Pt will improve 5x sit<>stand to less than or equal to 30 sec to demonstrate improved functional strength and transfer efficiency. Baseline: 56.29 sec> 90 sec 12/06/2022 Goal status: GOAL NOT MET  3.  Pt will improve TUG score to less than or equal to 20 sec for decreased fall risk. Baseline: 39.91 sec 10/21/2022; 54.78 sec 12/06/2022 Goal status: GOAL NOT MET  4.  Pt will improve Berg score to at least 40/56 to decrease fall risk. Baseline: 32/56>31/56 12/06/2022 Goal status: GOAL NOT MET    ASSESSMENT:  CLINICAL IMPRESSION: Assessed LTGs this visit, with pt cancelling last week's therapy sessions due to not feeling well.  LTG 1 met for HEP.  LTG 2 not met for FTSTS (with increase in time by approx 30 seconds), LTG 3 not met with increase in time  by 15 seconds, LTG 5 not met with Berg score decreased to 31/56 from 32/56.  Pt has only been seen for 2 PT visits since 11/04/22.  She remains at increased fall risk and she verbalizes increased fear of falling.  PT reviewed fall prevention education and benefits to using rolling walker more consistently, especially more on days when she feels worse with her balance.  She is hesitant to use RW in the community, as her walkers at home are bulky, heavy, borrowed walkers that she would have difficulty lifting in and out of car.  She may benefit from new lighter weight RW for more consistent use.  At this time, PT recommends discharge due to decreased progress towards goals.    OBJECTIVE IMPAIRMENTS Abnormal gait, decreased activity tolerance, decreased balance, decreased mobility, decreased strength, dizziness, impaired flexibility, and postural dysfunction.   ACTIVITY LIMITATIONS bending, standing, squatting, stairs, transfers, bed mobility, and locomotion level  PARTICIPATION LIMITATIONS: meal prep, cleaning, interpersonal relationship, and community  activity  PERSONAL FACTORS 1-2 comorbidities: neuropathy, h/o vertigo  are also affecting patient's functional outcome.   REHAB POTENTIAL: Good  CLINICAL DECISION MAKING: Stable/uncomplicated  EVALUATION COMPLEXITY: Low   PLAN: PT FREQUENCY:1x/wk   PT DURATION: 1 visit, 12/06/22  PLANNED INTERVENTIONS: Therapeutic exercises, Therapeutic activity, Neuromuscular re-education, Balance training, Gait training, Patient/Family education, Self Care, Joint mobilization, Vestibular training, Canalith repositioning, and Manual therapy  PLAN FOR NEXT SESSION:  Discharge this visit.   Mady Haagensen, PT 12/06/22 2:56 PM Phone: [phone] Fax: [phone]  [facility] at [facility] [place], [place], [place] [address] Phone # [phone] Fax # [phone]

## 2022-12-06 NOTE — Telephone Encounter (Signed)
Pt is aware of CT appt,   Is now asking about x ray resulting from a previous appt.

## 2022-12-06 NOTE — Telephone Encounter (Signed)
PT states no call back on the Chest X-ray and no one has called to set up the CT scan. Frustrated because she has "problems"  Adv I will let triage know and added that Dr. Has not given [place] an update on there CT scan yet.   CB # is [phone] or if no one answers  Cell is [phone]

## 2022-12-06 NOTE — Telephone Encounter (Signed)
Spoke with patient she is requesting results from chest xray.  Dr. Lamonte Sakai please advise?

## 2022-12-07 NOTE — Telephone Encounter (Signed)
Please let the patient know that her chest x-ray is clear, does not show any evidence of pneumonia or inflammatory change.  It does show her stable emphysema, previously characterized.  Good news.

## 2022-12-07 NOTE — Telephone Encounter (Signed)
Called and spoke with pt letting her know results of cxr per RB and she verbalized understanding. Nothing further needed.

## 2022-12-08 ENCOUNTER — Telehealth: Payer: Self-pay | Admitting: Anesthesiology

## 2022-12-08 NOTE — Telephone Encounter (Signed)
Pt called in and left a message with the access nurse. Pt has been very dizzy and there are bruises she needs the provider to see. Full access nurse report is in Dr. Amparo Bristol box

## 2022-12-08 NOTE — Telephone Encounter (Signed)
[place] for Jan 30 at 10am, thanks

## 2022-12-08 NOTE — Telephone Encounter (Signed)
Pt called stating she has an appointment with Dr Delice Lesch in 03/01/23 however she would like to see her sooner. States her balance has been worse and she doesn't feel comfortable moving around much also she has been having episodes of vertigo as well. Pt requests call back.

## 2022-12-10 ENCOUNTER — Other Ambulatory Visit: Payer: Self-pay | Admitting: Gastroenterology

## 2022-12-14 ENCOUNTER — Ambulatory Visit: Payer: Medicare HMO | Admitting: Neurology

## 2022-12-20 ENCOUNTER — Telehealth: Payer: Self-pay | Admitting: Emergency Medicine

## 2022-12-20 ENCOUNTER — Ambulatory Visit (HOSPITAL_COMMUNITY): Payer: Medicare HMO

## 2022-12-20 NOTE — Telephone Encounter (Signed)
Called patient and she states that she called and got her CT scheduled but she is having issues with her insurance. She states that we need to call and get her CT approved with her insurance. CT is already scheduled. Can we make sure that the CT is covered under her insurance  Thank you

## 2022-12-28 ENCOUNTER — Ambulatory Visit (HOSPITAL_BASED_OUTPATIENT_CLINIC_OR_DEPARTMENT_OTHER): Payer: Medicare HMO | Admitting: Pulmonary Disease

## 2022-12-29 ENCOUNTER — Ambulatory Visit (HOSPITAL_COMMUNITY)
Admission: RE | Admit: 2022-12-29 | Discharge: 2022-12-29 | Disposition: A | Discharge status: Home / Self Care | Payer: Medicare HMO | Source: Ambulatory Visit | Attending: Emergency Medicine | Admitting: Emergency Medicine

## 2022-12-29 DIAGNOSIS — J432 Centrilobular emphysema: Secondary | ICD-10-CM | POA: Diagnosis not present

## 2022-12-29 DIAGNOSIS — I700 Atherosclerosis of aorta: Secondary | ICD-10-CM | POA: Diagnosis not present

## 2022-12-29 DIAGNOSIS — J479 Bronchiectasis, uncomplicated: Secondary | ICD-10-CM | POA: Diagnosis present

## 2022-12-29 DIAGNOSIS — J181 Lobar pneumonia, unspecified organism: Secondary | ICD-10-CM | POA: Diagnosis not present

## 2023-01-05 ENCOUNTER — Encounter: Payer: Self-pay | Admitting: Podiatry

## 2023-01-05 ENCOUNTER — Ambulatory Visit (INDEPENDENT_AMBULATORY_CARE_PROVIDER_SITE_OTHER): Payer: Medicare HMO | Admitting: Podiatry

## 2023-01-05 VITALS — BP 123/78

## 2023-01-05 DIAGNOSIS — M79674 Pain in right toe(s): Secondary | ICD-10-CM | POA: Diagnosis not present

## 2023-01-05 DIAGNOSIS — L84 Corns and callosities: Secondary | ICD-10-CM

## 2023-01-05 DIAGNOSIS — Q828 Other specified congenital malformations of skin: Secondary | ICD-10-CM

## 2023-01-05 DIAGNOSIS — G629 Polyneuropathy, unspecified: Secondary | ICD-10-CM

## 2023-01-05 DIAGNOSIS — M79675 Pain in left toe(s): Secondary | ICD-10-CM

## 2023-01-05 DIAGNOSIS — B351 Tinea unguium: Secondary | ICD-10-CM

## 2023-01-05 DIAGNOSIS — M8589 Other specified disorders of bone density and structure, multiple sites: Secondary | ICD-10-CM | POA: Insufficient documentation

## 2023-01-05 NOTE — Progress Notes (Signed)
  Subjective:  Patient ID: [name], female    DOB: 04/16/1941,  MRN: [id number]  [name] presents to clinic today for at risk foot care with history of peripheral neuropathy and corn(s) left foot, callus(es) right lower extremity, porokeratotic lesion(s) left lower extremity,and painful mycotic nails. Painful toenails interfere with ambulation. Aggravating factors include wearing enclosed shoe gear. Pain is relieved with periodic professional debridement. Painful corns, callus(es) and porokeratotic lesion(s) are aggravated when weightbearing with and without shoegear. Pain is relieved with periodic professional debridement.  Chief Complaint  Patient presents with   Nail Problem    [facility] PCP-Raju PCP VST-11/2022   New problem(s): None.   PCP is Charlane Ferretti, MD.  Allergies  Allergen Reactions   Prednisone     Does not remember reaction    Sulfa Antibiotics     Does not remember reaction    Cortisone Other (See Comments)    Does not remember reaction    Sulfonamide Derivatives Other (See Comments)    Does not remember reaction     Review of Systems: Negative except as noted in the HPI.  Objective: Vitals:   01/05/23 1437  BP: 123/78   [name] is a pleasant 82 y.o. female thin build in NAD. AAO x 3.  Neurovascular Examination: Neurovascular status unchanged bilateral lower extremities. Capillary refill time to digits immediate b/l. Palpable pedal pulses b/l LE. Pedal hair sparse. No pain with calf compression b/l. Lower extremity skin temperature gradient within normal limits. No edema noted b/l LE. No cyanosis or clubbing noted b/l LE.  Protective sensation decreased with 10 gram monofilament b/l.  Dermatological:  Pedal skin warm and supple b/l.  No open wounds b/l. No interdigital macerations. Toenails 1-5 b/l elongated, thickened, discolored with subungual debris. +Tenderness with dorsal palpation of nailplates.   Hyperkeratotic lesion(s) distal tip of left  3rd toe and 1st metatarsal head right foot.  No erythema, no edema, no drainage, no fluctuance.  Porokeratotic lesion(s) submet head 1 left foot with tenderness to palpation. No edema, no erythema, no drainage, no fluctuance.   Musculoskeletal:  Normal muscle strength 5/5 to all lower extremity muscle groups bilaterally. Hammertoe deformity noted 2-5 b/l. No pain, crepitus or joint limitation noted with ROM b/l LE.  Patient ambulates independently without assistive aids.  Assessment/Plan: 1. Pain due to onychomycosis of toenails of both feet   2. Porokeratosis   3. Corns and callosities   4. Neuropathy    -Consent given for treatment as described below: -Examined patient. -Mycotic toenails 1-5 bilaterally were debrided in length and girth with sterile nail nippers and dremel without incident. -Corn(s) L 3rd toe and callus(es) 1st metatarsal head right lower extremity were pared utilizing sterile scalpel blade without incident. Total number debrided =2. -Porokeratotic lesion(s) submet head 1 left foot pared and enucleated with sterile currette without incident. Total number of lesions debrided=1. -Patient/POA to call should there be question/concern in the interim.   Return in about 3 months (around 04/05/2023).  Marzetta Board, DPM

## 2023-01-13 ENCOUNTER — Encounter (INDEPENDENT_AMBULATORY_CARE_PROVIDER_SITE_OTHER): Payer: Medicare HMO | Admitting: Ophthalmology

## 2023-01-13 DIAGNOSIS — D3132 Benign neoplasm of left choroid: Secondary | ICD-10-CM | POA: Diagnosis not present

## 2023-01-13 DIAGNOSIS — H04123 Dry eye syndrome of bilateral lacrimal glands: Secondary | ICD-10-CM | POA: Diagnosis not present

## 2023-01-13 DIAGNOSIS — H35372 Puckering of macula, left eye: Secondary | ICD-10-CM | POA: Diagnosis not present

## 2023-01-13 DIAGNOSIS — H353131 Nonexudative age-related macular degeneration, bilateral, early dry stage: Secondary | ICD-10-CM | POA: Diagnosis not present

## 2023-01-18 ENCOUNTER — Ambulatory Visit (INDEPENDENT_AMBULATORY_CARE_PROVIDER_SITE_OTHER): Payer: Medicare HMO | Admitting: Pulmonary Disease

## 2023-01-18 ENCOUNTER — Encounter (HOSPITAL_BASED_OUTPATIENT_CLINIC_OR_DEPARTMENT_OTHER): Payer: Self-pay | Admitting: Pulmonary Disease

## 2023-01-18 VITALS — BP 104/68 | HR 95 | Ht 64.0 in | Wt 72.4 lb

## 2023-01-18 DIAGNOSIS — J471 Bronchiectasis with (acute) exacerbation: Secondary | ICD-10-CM

## 2023-01-18 MED ORDER — LEVALBUTEROL TARTRATE 45 MCG/ACT IN AERO: 1.0000 | INHALATION_SPRAY | Freq: Every day | RESPIRATORY_TRACT | 12 refills | Status: DC

## 2023-01-18 NOTE — Patient Instructions (Signed)
Will arrange for sampling of your phlegm for culture.  Will arrange for new flutter valve.  Use mucinex, one puff of inhaler and then flutter valve in the morning.  Use mucinex and flutter valve in the evening.  Follow up in 6 weeks.

## 2023-01-18 NOTE — Progress Notes (Signed)
[facility]  Chief Complaint  Patient presents with   Follow-up    Pt states she is still coughing getting up phlegm that is thick and yellow-green. Also has SOB involved.    Constitutional:  BP 104/68 (BP Location: Right Arm, Patient Position: Sitting, Cuff Size: Normal)   Pulse 95   Ht '5\' 4"'$  (1.626 m)   Wt 72 lb 6.4 oz (32.8 kg)   SpO2 96% Comment: RA  BMI 12.43 kg/m   Past Medical History:  OA, Esophageal dysmotility, Nutcracker esophagus, Gastroparesis, GERD, Iron deficiency anemia, Neuropathy, Raynaud's  Past Surgical History:  She  has a past surgical history that includes Cholecystectomy (2005); Breast lumpectomy; Total abdominal hysterectomy; Lung biopsy; Cataract extraction; Colonoscopy; Upper gastrointestinal endoscopy; Esophageal manometry (N/A, 10/06/2015); 24 hour ph study (N/A, 10/13/2015); Esophageal manometry (N/A, 03/10/2018); Esophageal manometry (N/A, 10/15/2020); Esophagogastroduodenoscopy (egd) with propofol (N/A, 08/02/2022); Foreign Body Removal (08/02/2022); and Savory dilation (N/A, 08/02/2022).  Brief Summary:  [name] is a 82 y.o. female former smoker with bronchiectasis, recurrent aspiration pneumonia with history of reflux, emphysema and history of cryptogenic organizing pneumonia.      Subjective:   She saw Dr. Lamonte Sakai in January.  She didn't start stiolto.  She was treated with 7 days of levaquin.  She initially felt better.  More recently she has increased cough and sputum.  Her cough is weak, and it is hard for her to bring up phlegm.  She has trouble feeling dizzy.  She tried using mucinex twice per day and this helped.  She has felt feverish, but hasn't check her temperature.  No having hemoptysis.  Physical Exam:   Appearance - cachectic, uses a cane  ENMT - no sinus tenderness, no oral exudate, no LAN, Mallampati 2 airway, no stridor  Respiratory - b/l squeaks and crackles that partially clear with  cough  CV - s1s2 regular rate and rhythm, no murmurs  Ext - 1+ lower extremity edema  Skin - multiple areas of ecchymosis  Psych - normal mood and affect       Pulmonary testing:  VATS lung biopsy 2003 >> BOOP PFT 12/31/09 >> FEV1 1.84(84%), FEV1% 73, TLC 4.94(98%), DLCO 77%, +BD. PFT 06/21/16 >> FEV1 1.22 (55%), FEV1% 76, TLC 5.29 (104%), DLCO 43% PFT 12/21/16 >> FEV1 1.49 (69%), FEV1% 78, TLC 5.42 (107%), RV 158%, DLCO 51%  Chest Imaging:  CT chest 08/17/10 >> mild biapical scarring, basilar peribronchovascular nodularity and mild bronchiectasis HRCT chest 11/28/17 >> mild centrilobular emphysema, peribronchovascular nodularity, mild BTX, LLL consolidation CT chest 03/02/18 >> mild emphysema, areas of BTX, several nodular areas up to 1.4 cm CT chest 08/25/18 >> centrilobular emphysema, BTX with wall thickening and tree in bud RML/lingula/bilateral lower lobes, unchanged opacity LLL, new patchy opacities LUL  HRCT chest 09/21/19 >> apical scarring, diffuse BTX with peribronchovascular nodularity, areas of mucoid impaction, coronary calcification HRCT chest 12/30/22 >> apical scarring, centrilobular emphysema, cylindrical BTX in mid and lower lung zones with nodularity and mucoid impaction with progression since 2020, LLL consolidation  Cardiac Tests:  Echo 10/08/19 >> EF 60 to 65%, mild LVH, grade 2 DD, mild MR, mild TR  Social History:  She  reports that she quit smoking about 54 years ago. Her smoking use included cigarettes. She has a 2.50 pack-year smoking history. She has never used smokeless tobacco. She reports that she does not drink alcohol and does not use drugs.  Family History:  Her family history includes Cancer  in her maternal grandmother; Heart disease in her father and paternal grandfather; Leukemia in her paternal grandmother; Lung cancer in her paternal uncle; Lymphoma in her mother.     Discussion:  She has long standing history of bronchiectasis in setting of  recurrent reflux and aspiration pneumonitis.  Over the past few months she has progressive with significant weight loss.  As a result she has a weak cough and difficulty with expectoration.  Her recent CT chest shows progression of bronchiectasis.  She previous had difficulty tolerating inhalers, nebulizers, and chest clearance devices.  Explained that it is paramount that we improve her chest clearance methods, and that we check her sputum culture for bacteria and AFB.  Assessment/Plan:   Bronchiectasis, centrilobular emphysema. - symbicort, spiriva, albuterol and hypertonic saline caused palpitations and dizziness - she is willing to try flutter valve again - will have her try xopenex hfa - will have her do mucinex, xopenex 1 puff, and flutter valve in the morning - will have her do mucinex and flutter valve in the evening - will arrange for sputum culture and sputum for AFB - I don't think she could tolerate bronchoscopy given her frail state - she is reluctant to try chest vest, but would consider if above measures don't improve her status  Peripheral neuropathy. - followed by Dr. Delice Lesch with [facility]   Raynaud's phenomenon. - followed by Rheumatology at [facility]   GERD with dysphagia. - she will schedule follow up with Dr. Havery Moros with [facility]   Cachexia. - discussed options to keep up with caloric and protein intake  Time Spent Involved in Patient Care on Day of Examination:  38 minutes  Follow up:   Patient Instructions  Will arrange for sampling of your phlegm for culture.  Will arrange for new flutter valve.  Use mucinex, one puff of inhaler and then flutter valve in the morning.  Use mucinex and flutter valve in the evening.  Follow up in 6 weeks.  Medication List:   Allergies as of 01/18/2023       Reactions   Prednisone    Does not remember reaction    Sulfa Antibiotics    Does not remember reaction    Cortisone Other (See  Comments)   Does not remember reaction    Sulfonamide Derivatives Other (See Comments)   Does not remember reaction         Medication List        Accurate as of January 18, 2023 12:09 PM. If you have any questions, ask your nurse or doctor.          STOP taking these medications    levofloxacin 500 MG tablet Commonly known as: LEVAQUIN Stopped by: Chesley Mires, MD   Stiolto Respimat 2.5-2.5 MCG/ACT Aers Generic drug: Tiotropium Bromide-Olodaterol Stopped by: Chesley Mires, MD       TAKE these medications    acetaminophen 500 MG tablet Commonly known as: TYLENOL Take 500 mg by mouth daily as needed (pain).   benzonatate 200 MG capsule Commonly known as: TESSALON Take 1 capsule (200 mg total) by mouth 3 (three) times daily as needed for cough.   famotidine 20 MG tablet Commonly known as: PEPCID TAKE ONE TABLET ('20MG'$ ) BY MOUTH TWICE DAILY   guaiFENesin 600 MG 12 hr tablet Commonly known as: MUCINEX Take 600 mg by mouth 2 (two) times daily as needed for to loosen phlegm or cough.   levalbuterol 45 MCG/ACT inhaler Commonly known as: Xopenex HFA Inhale  1 puff into the lungs daily. Started by: Chesley Mires, MD   omeprazole 40 MG capsule Commonly known as: PRILOSEC TAKE ONE CAPSULE BY MOUTH TWICE DAILY   sucralfate 1 g tablet Commonly known as: CARAFATE Take 1 tablet (1 g total) by mouth every 6 (six) hours as needed. Slowly dissolve 1 tablet in 1 Tbs of water before ingesting   Viactiv Calcium Plus D 650-12.5-40 MG-MCG-MCG Chew Generic drug: Calcium-Vitamin D-Vitamin K Chew 1 each by mouth daily.        Signature:  Chesley Mires, MD [place] Pager - [phone] 01/18/2023, 12:09 PM

## 2023-01-19 DIAGNOSIS — J471 Bronchiectasis with (acute) exacerbation: Secondary | ICD-10-CM | POA: Diagnosis not present

## 2023-01-21 ENCOUNTER — Ambulatory Visit (INDEPENDENT_AMBULATORY_CARE_PROVIDER_SITE_OTHER): Payer: Medicare HMO | Admitting: Neurology

## 2023-01-21 ENCOUNTER — Encounter: Payer: Self-pay | Admitting: Neurology

## 2023-01-21 VITALS — BP 155/99 | HR 80 | Ht 64.0 in | Wt 76.0 lb

## 2023-01-21 DIAGNOSIS — G629 Polyneuropathy, unspecified: Secondary | ICD-10-CM | POA: Diagnosis not present

## 2023-01-21 DIAGNOSIS — I872 Venous insufficiency (chronic) (peripheral): Secondary | ICD-10-CM

## 2023-01-21 MED ORDER — PENTOXIFYLLINE ER 400 MG PO TBCR: EXTENDED_RELEASE_TABLET | ORAL | 6 refills | Status: DC

## 2023-01-21 NOTE — Progress Notes (Signed)
NEUROLOGY FOLLOW UP OFFICE NOTE  [name] [id number] August 23, 1941  HISTORY OF PRESENT ILLNESS: I had the pleasure of seeing [name] in follow-up in the neurology clinic on 01/21/2023.  The patient was last seen 5 months ago for neuropathy. She is alone in the office today. Records and images were personally reviewed where available.  Since her last visit, she reports an increase in falls. She lost her balance last night and sustained an abrasion on her left elbow. She notes her legs are not strong, she also feels dizzy. She did physical therapy but did not feel it helped. Last visit in 11/2022 indicated discussion on fall prevention, benefits of using RW at all times. She has worsened symptoms describing "weakness" with positional testing. She reports some back pain. It is very difficult for her to lift her walker into a car by herself. She lives alone. [name]   HPI: This is a pleasant 82 yo RH woman with a history of GERD, nutcracker esophagus, pulmonary fibrosis, in her usual state of health until early June 2017 while walking into a grocery she suddenly had left temporal pain, then it felt like she was hit on both side of the head with a hammer. She reports she was "hit like gangbusters," she was very dizzy with a spinning sensation, nauseated. She held on to the cart for balance and had difficulty concentrating. Her vision was blurred. She managed to finish her chore and walked to the car, then noticed that she was having a hard time talking. She could see the sentence broken up in her head and could not get the words out. She was able to drive home with no focal symptoms noted. The episode lasted 30 minutes or so, she felt weak and tired after. Since then she has had at least 2 or 3 more episodes of headaches with dizziness, which did not progress to speech difficulties. The symptoms would last around 5 minutes, no clear positional component to the dizziness. When she feels it coming on, she massages  her head, which seems to help. She was unsure if Tylenol helped. She denies any prior history of headaches. She denies any vision loss, photo/phonophobia, diplopia, dysarthria, jaw claudication, hearing loss. She has occasional pulsatile tinnitus more in the afternoons. She has GERD which makes swallowing difficult. She has a history of neuropathy affecting both feet, R>L, with burning and numbness/tingling for several years. She has been doing laser therapy for venous insufficiency/varicose veins. She denies any falls but trips more. Sleep varies. She is very active and rides her bike for a couple of hours daily. She works at a preschool. Her mother had a stroke at age 16.   Diagnostic Data:  Venous dopplers showed chronic venous insufficiency, duplex showed limited valvular insufficiency.    EMG/NCV of the right UE and LE confirmed moderate chronic sensorimotor axonal polyneuropathy. There was also note of moderate right ulnar neuropathy with slowing across the elbow and mild right median neuropathy consistent with carpal tunnel syndrome.   EMG at [facility] showing mild sensory neuropathy and negative punch biopsy for small fiber neuropathy, no evidence of vasculitis.  She has also been evaluated by Rhuematology for Reynauds disease and by Vascular at [facility] last June 2021. Records were reviewed, she has severe venous insufficiency, mostly in the deep veins. It was discussed how this contributes to majority of her symptoms, including neuropathy. There were severe skin changes compatible with stasis dermatitis and venous stasis. She was prescribed  knee high compression stockings.   PAST MEDICAL HISTORY: Past Medical History:  Diagnosis Date   Allergy    Arthritis    Atherosclerosis of aorta ([place]) 2019   ct   BOOP (bronchiolitis obliterans with organizing pneumonia) ([place]) 2003   Bronchiectasis    Cataract    Esophageal dysmotility    Esophageal ulcer    Gastroparesis    GERD  (gastroesophageal reflux disease)    severe   Headache    migraines   Hiatal hernia    neuropathy right foot and leg   Iron deficiency anemia    Macular degeneration (senile) of retina    Neuropathy    Nutcracker esophagus    Osteopenia    Pneumonia    Pulmonary fibrosis ([place])    Raynaud's disease    Recurrent pneumonia    Due to aspiration   Thrombocytosis     MEDICATIONS: Current Outpatient Medications on File Prior to Visit  Medication Sig Dispense Refill   acetaminophen (TYLENOL) 500 MG tablet Take 500 mg by mouth daily as needed (pain).     benzonatate (TESSALON) 200 MG capsule Take 1 capsule (200 mg total) by mouth 3 (three) times daily as needed for cough. 90 capsule 3   Calcium-Vitamin D-Vitamin K (VIACTIV CALCIUM PLUS D) 650-12.5-40 MG-MCG-MCG CHEW Chew 1 each by mouth daily.     famotidine (PEPCID) 20 MG tablet TAKE ONE TABLET (20MG ) BY MOUTH TWICE DAILY 180 tablet 3   guaiFENesin (MUCINEX) 600 MG 12 hr tablet Take 600 mg by mouth 2 (two) times daily as needed for to loosen phlegm or cough.     levalbuterol (XOPENEX HFA) 45 MCG/ACT inhaler Inhale 1 puff into the lungs daily. 1 each 12   omeprazole (PRILOSEC) 40 MG capsule TAKE ONE CAPSULE BY MOUTH TWICE DAILY 60 capsule 2   sucralfate (CARAFATE) 1 g tablet Take 1 tablet (1 g total) by mouth every 6 (six) hours as needed. Slowly dissolve 1 tablet in 1 Tbs of water before ingesting 60 tablet 3   No current facility-administered medications on file prior to visit.    ALLERGIES: Allergies  Allergen Reactions   Prednisone     Does not remember reaction    Sulfa Antibiotics     Does not remember reaction    Cortisone Other (See Comments)    Does not remember reaction    Sulfonamide Derivatives Other (See Comments)    Does not remember reaction     FAMILY HISTORY: Family History  Problem Relation Age of Onset   Lymphoma Mother    Heart disease Father    Lung cancer Paternal Uncle    Heart disease Paternal  Grandfather    Leukemia Paternal Grandmother    Cancer Maternal Grandmother        unknown type   Colon cancer Neg Hx    Esophageal cancer Neg Hx    Rectal cancer Neg Hx    Stomach cancer Neg Hx     SOCIAL HISTORY: Social History   Socioeconomic History   Marital status: Widowed    Spouse name: Not on file   Number of children: 2   Years of education: Not on file   Highest education level: Not on file  Occupational History   Occupation: retired Pharmacist, hospital    Comment: still teaching pre K   Tobacco Use   Smoking status: Former    Packs/day: 0.25    Years: 10.00    Total pack years: 2.50    Types:  Cigarettes    Quit date: 11/15/1968    Years since quitting: 54.2   Smokeless tobacco: Never  Vaping Use   Vaping Use: Never used  Substance and Sexual Activity   Alcohol use: No   Drug use: No   Sexual activity: Not Currently    Partners: Male  Other Topics Concern   Not on file  Social History Narrative   Still working as a Print production planner at [employer]. - Retired 43yrs      Widowed      Lives alone   Lives one story townhouse      Right handed.   Social Determinants of Health   Financial Resource Strain: Not on file  Food Insecurity: Not on file  Transportation Needs: Not on file  Physical Activity: Not on file  Stress: Not on file  Social Connections: Not on file  Intimate Partner Violence: Not on file     PHYSICAL EXAM: Vitals:   01/21/23 1153 01/21/23 1221  BP: (!) 147/104 (!) 155/99  Pulse:  80  SpO2:  100%   General: No acute distress Head:  Normocephalic/atraumatic Skin/Extremities: purplish discoloration on dorsum of both hands, both feet up to ankles. Multiple bilateral leg abrasions Neurological Exam: alert and awake. No aphasia or dysarthria. Fund of knowledge is appropriate.  Attention and concentration are normal.   Cranial nerves: Pupils equal, round. Extraocular movements intact with no nystagmus. Visual fields full.  No facial  asymmetry.  Motor: Bulk and tone normal, muscle strength 5/5 on both UE, 4/5 right hip flexion otherwise 5/5 on LE. Finger to nose testing intact.  Gait slow and cautious, unsteady.   IMPRESSION: This is a pleasant 82 yo RH woman with a history of GERD, nutcracker esophagus, pulmonary fibrosis, initially seen for transient episode of headache, vertigo, and expressive aphasia, MRI brain no acute changes. Her MRA head and neck was unremarkable except for moderate stenosis of the left P1 and diminished distal flow-related signal within the left PCA distribution. No further episodes since June 2017, continue daily aspirin and control of vascular risk factors. She continues to report frequent falls, she has been to physical therapy with not much improvement, recommendation to use rolling walker at all times. We again discussed Vascular Surgery recommendation to try Pentoxyfilline 400mg  every night with food, side effects discussed. She has very low dose gabapentin for neuropathic pain. She will be referred to wound care. Follow-up in 3 months, call for any changes.     Thank you for allowing me to participate in her care.  Please do not hesitate to call for any questions or concerns.    Ellouise Newer, M.D.   CC: Dr. Louis Matte

## 2023-01-21 NOTE — Patient Instructions (Addendum)
Good to see you.  Recommend checking out different medical supply stores for a lighter weight walker  2. Referral will be sent to Wound care  3. Try the Pentoxyfilline (Trental) '400mg'$ : take 1 tablet every night with food  4. Continue low dose gabapentin   5. Follow-up in 3 months, call for any changes

## 2023-01-23 ENCOUNTER — Other Ambulatory Visit: Payer: Self-pay | Admitting: Gastroenterology

## 2023-02-06 ENCOUNTER — Encounter: Payer: Self-pay | Admitting: Neurology

## 2023-02-09 DIAGNOSIS — H52203 Unspecified astigmatism, bilateral: Secondary | ICD-10-CM | POA: Diagnosis not present

## 2023-02-09 DIAGNOSIS — H01005 Unspecified blepharitis left lower eyelid: Secondary | ICD-10-CM | POA: Diagnosis not present

## 2023-02-09 DIAGNOSIS — Z961 Presence of intraocular lens: Secondary | ICD-10-CM | POA: Diagnosis not present

## 2023-02-09 DIAGNOSIS — H01002 Unspecified blepharitis right lower eyelid: Secondary | ICD-10-CM | POA: Diagnosis not present

## 2023-02-09 DIAGNOSIS — D3132 Benign neoplasm of left choroid: Secondary | ICD-10-CM | POA: Diagnosis not present

## 2023-02-25 ENCOUNTER — Other Ambulatory Visit: Payer: Self-pay | Admitting: Gastroenterology

## 2023-02-28 ENCOUNTER — Encounter (HOSPITAL_BASED_OUTPATIENT_CLINIC_OR_DEPARTMENT_OTHER): Payer: Self-pay | Admitting: Pulmonary Disease

## 2023-02-28 ENCOUNTER — Ambulatory Visit (INDEPENDENT_AMBULATORY_CARE_PROVIDER_SITE_OTHER): Payer: Medicare HMO | Admitting: Pulmonary Disease

## 2023-02-28 VITALS — BP 120/62 | HR 71 | Temp 97.6°F | Ht 65.0 in | Wt 76.6 lb

## 2023-02-28 DIAGNOSIS — J479 Bronchiectasis, uncomplicated: Secondary | ICD-10-CM | POA: Diagnosis not present

## 2023-02-28 NOTE — Patient Instructions (Signed)
You can try over the counter delsym to help with cough  Follow up in 4 months

## 2023-02-28 NOTE — Progress Notes (Signed)
[facility]  Chief Complaint  Patient presents with   Follow-up    Follow up. Patient states she is very dizzy, legs get very weak, still hard to breath.     Constitutional:  BP 120/62 (BP Location: Left Arm, Patient Position: Sitting, Cuff Size: Normal)   Pulse 71   Temp 97.6 F (36.4 C) (Oral)   Ht 5\' 5"  (1.651 m)   Wt 76 lb 9.6 oz (34.7 kg)   SpO2 99%   BMI 12.75 kg/m   Past Medical History:  OA, Esophageal dysmotility, Nutcracker esophagus, Gastroparesis, GERD, Iron deficiency anemia, Neuropathy, Raynaud's  Past Surgical History:  She  has a past surgical history that includes Cholecystectomy (2005); Breast lumpectomy; Total abdominal hysterectomy; Lung biopsy; Cataract extraction; Colonoscopy; Upper gastrointestinal endoscopy; Esophageal manometry (N/A, 10/06/2015); 24 hour ph study (N/A, 10/13/2015); Esophageal manometry (N/A, 03/10/2018); Esophageal manometry (N/A, 10/15/2020); Esophagogastroduodenoscopy (egd) with propofol (N/A, 08/02/2022); Foreign Body Removal (08/02/2022); and Savory dilation (N/A, 08/02/2022).  Brief Summary:  [name] is a 82 y.o. female former smoker with bronchiectasis, recurrent aspiration pneumonia with history of reflux, emphysema and history of cryptogenic organizing pneumonia.      Subjective:   She wasn't able tolerate flutter valve or xopenex.  Has cough with yellow sputum.  This is usual appearance.  No chest pain, fever, or hemoptysis.  Not much appetite.  She has been using mucinex bid, and tessalon.  Physical Exam:   Appearance - cachectic, uses a cane  ENMT - no sinus tenderness, no oral exudate, no LAN, Mallampati 2 airway, no stridor  Respiratory - no wheeze/rales  CV - s1s2 regular rate and rhythm, no murmurs  Ext - 1+ lower extremity edema  Skin - multiple areas of ecchymosis  Psych - normal mood and affect       Pulmonary testing:  VATS lung biopsy 2003 >> BOOP PFT  12/31/09 >> FEV1 1.84(84%), FEV1% 73, TLC 4.94(98%), DLCO 77%, +BD. PFT 06/21/16 >> FEV1 1.22 (55%), FEV1% 76, TLC 5.29 (104%), DLCO 43% PFT 12/21/16 >> FEV1 1.49 (69%), FEV1% 78, TLC 5.42 (107%), RV 158%, DLCO 51%  Chest Imaging:  CT chest 08/17/10 >> mild biapical scarring, basilar peribronchovascular nodularity and mild bronchiectasis HRCT chest 11/28/17 >> mild centrilobular emphysema, peribronchovascular nodularity, mild BTX, LLL consolidation CT chest 03/02/18 >> mild emphysema, areas of BTX, several nodular areas up to 1.4 cm CT chest 08/25/18 >> centrilobular emphysema, BTX with wall thickening and tree in bud RML/lingula/bilateral lower lobes, unchanged opacity LLL, new patchy opacities LUL  HRCT chest 09/21/19 >> apical scarring, diffuse BTX with peribronchovascular nodularity, areas of mucoid impaction, coronary calcification HRCT chest 12/30/22 >> apical scarring, centrilobular emphysema, cylindrical BTX in mid and lower lung zones with nodularity and mucoid impaction with progression since 2020, LLL consolidation  Cardiac Tests:  Echo 10/08/19 >> EF 60 to 65%, mild LVH, grade 2 DD, mild MR, mild TR  Social History:  She  reports that she quit smoking about 54 years ago. Her smoking use included cigarettes. She has a 2.50 pack-year smoking history. She has never used smokeless tobacco. She reports that she does not drink alcohol and does not use drugs.  Family History:  Her family history includes Cancer in her maternal grandmother; Heart disease in her father and paternal grandfather; Leukemia in her paternal grandmother; Lung cancer in her paternal uncle; Lymphoma in her mother.     Discussion:  She has long standing history of bronchiectasis in setting  of recurrent reflux and aspiration pneumonitis.  She has not been able to tolerate most therapies for this.  Assessment/Plan:   Bronchiectasis, centrilobular emphysema. - flutter valve, symbicort, spiriva, albuterol, levalbuterol  and hypertonic saline caused palpitations and dizziness - continue mucinex bid - prn tessalon, delsym - discussed symptoms to monitor for that would indicate she has an active infection and needs antibiotics - I don't think she could tolerate bronchoscopy given her frail state - she is reluctant to try chest vest  Peripheral neuropathy. - followed by Dr. Karel Jarvis with [facility]   Raynaud's phenomenon. - followed by [facility]ology at [facility]   GERD with dysphagia. - she will schedule follow up with Dr. Adela Lank with [facility]   Cachexia. - discussed options to keep up with caloric and protein intake  Time Spent Involved in Patient Care on Day of Examination:  27 minutes  Follow up:   Patient Instructions  You can try over the counter delsym to help with cough  Follow up in 4 months  Medication List:   Allergies as of 02/28/2023       Reactions   Prednisone    Does not remember reaction    Sulfa Antibiotics    Does not remember reaction    Cortisone Other (See Comments)   Does not remember reaction    Sulfonamide Derivatives Other (See Comments)   Does not remember reaction         Medication List        Accurate as of February 28, 2023  1:53 PM. If you have any questions, ask your nurse or doctor.          acetaminophen 500 MG tablet Commonly known as: TYLENOL Take 500 mg by mouth daily as needed (pain).   benzonatate 200 MG capsule Commonly known as: TESSALON Take 1 capsule (200 mg total) by mouth 3 (three) times daily as needed for cough.   guaiFENesin 600 MG 12 hr tablet Commonly known as: MUCINEX Take 600 mg by mouth 2 (two) times daily as needed for to loosen phlegm or cough.   levalbuterol 45 MCG/ACT inhaler Commonly known as: Xopenex HFA Inhale 1 puff into the lungs daily.   omeprazole 40 MG capsule Commonly known as: PRILOSEC TAKE ONE CAPSULE BY MOUTH TWICE DAILY   pentoxifylline 400 MG CR tablet Commonly known as:  TRENTAL Take 1 tablet at night with meals   sucralfate 1 g tablet Commonly known as: CARAFATE Take 1 tablet (1 g total) by mouth every 6 (six) hours as needed. Slowly dissolve 1 tablet in 1 Tbs of water before ingesting   Viactiv Calcium Plus D 650-12.5-40 MG-MCG-MCG Chew Generic drug: Calcium-Vitamin D-Vitamin K Chew 1 each by mouth daily.        Signature:  [name], MD [facility] Pulmonary/Critical Care Pager - [phone] 02/28/2023, 1:53 PM

## 2023-03-01 ENCOUNTER — Ambulatory Visit: Payer: Medicare HMO | Admitting: Neurology

## 2023-03-23 ENCOUNTER — Other Ambulatory Visit: Payer: Self-pay | Admitting: Adult Health

## 2023-04-04 ENCOUNTER — Ambulatory Visit (INDEPENDENT_AMBULATORY_CARE_PROVIDER_SITE_OTHER): Payer: Medicare HMO | Admitting: Pulmonary Disease

## 2023-04-04 ENCOUNTER — Encounter (HOSPITAL_BASED_OUTPATIENT_CLINIC_OR_DEPARTMENT_OTHER): Payer: Self-pay | Admitting: Pulmonary Disease

## 2023-04-04 VITALS — BP 122/88 | HR 81 | Temp 97.5°F | Ht 64.0 in | Wt 72.0 lb

## 2023-04-04 DIAGNOSIS — J471 Bronchiectasis with (acute) exacerbation: Secondary | ICD-10-CM

## 2023-04-04 MED ORDER — LEVOFLOXACIN 250 MG PO TABS: 250.0000 mg | ORAL_TABLET | Freq: Every day | ORAL | 0 refills | Status: DC

## 2023-04-04 NOTE — Patient Instructions (Signed)
Levaquin 1 pill daily for 7 days  Follow up in 4 weeks

## 2023-04-04 NOTE — Progress Notes (Signed)
[facility]  Chief Complaint  Patient presents with   Follow-up    Follow up. Patient has a constant cough and states she chokes when she coughs as well.     Constitutional:  BP 122/88 (BP Location: Left Arm, Patient Position: Sitting, Cuff Size: Normal)   Pulse 81   Temp (!) 97.5 F (36.4 C) (Oral)   Ht 5\' 4"  (1.626 m)   Wt 72 lb (32.7 kg)   SpO2 98%   BMI 12.36 kg/m   Past Medical History:  OA, Esophageal dysmotility, Nutcracker esophagus, Gastroparesis, GERD, Iron deficiency anemia, Neuropathy, Raynaud's  Past Surgical History:  She  has a past surgical history that includes Cholecystectomy (2005); Breast lumpectomy; Total abdominal hysterectomy; Lung biopsy; Cataract extraction; Colonoscopy; Upper gastrointestinal endoscopy; Esophageal manometry (N/A, 10/06/2015); 24 hour ph study (N/A, 10/13/2015); Esophageal manometry (N/A, 03/10/2018); Esophageal manometry (N/A, 10/15/2020); Esophagogastroduodenoscopy (egd) with propofol (N/A, 08/02/2022); Foreign Body Removal (08/02/2022); and Savory dilation (N/A, 08/02/2022).  Brief Summary:  [name] is a 82 y.o. female former smoker with bronchiectasis, recurrent aspiration pneumonia with history of reflux, emphysema and history of cryptogenic organizing pneumonia.      Subjective:   She is getting weaker.  Weight down more.  Has more trouble with cough and bringing up phlegm.  She has been getting a tickle in her throat and sometimes has a dry cough.  She feels like she can't breath when she gets into a long coughing spell.  She has been getting more dizzy when walking and has fallen.  Physical Exam:   Appearance - thin, frail, uses a cane  ENMT - no sinus tenderness, no oral exudate, no LAN, Mallampati 2 airway, no stridor  Respiratory - scattered rhonchi  CV - s1s2 regular rate and rhythm, no murmurs  Ext - 1+ edema  Skin - venous stasis changes  Psych - normal mood and  affect       Pulmonary testing:  VATS lung biopsy 2003 >> BOOP PFT 12/31/09 >> FEV1 1.84(84%), FEV1% 73, TLC 4.94(98%), DLCO 77%, +BD. PFT 06/21/16 >> FEV1 1.22 (55%), FEV1% 76, TLC 5.29 (104%), DLCO 43% PFT 12/21/16 >> FEV1 1.49 (69%), FEV1% 78, TLC 5.42 (107%), RV 158%, DLCO 51%  Chest Imaging:  CT chest 08/17/10 >> mild biapical scarring, basilar peribronchovascular nodularity and mild bronchiectasis HRCT chest 11/28/17 >> mild centrilobular emphysema, peribronchovascular nodularity, mild BTX, LLL consolidation CT chest 03/02/18 >> mild emphysema, areas of BTX, several nodular areas up to 1.4 cm CT chest 08/25/18 >> centrilobular emphysema, BTX with wall thickening and tree in bud RML/lingula/bilateral lower lobes, unchanged opacity LLL, new patchy opacities LUL  HRCT chest 09/21/19 >> apical scarring, diffuse BTX with peribronchovascular nodularity, areas of mucoid impaction, coronary calcification HRCT chest 12/30/22 >> apical scarring, centrilobular emphysema, cylindrical BTX in mid and lower lung zones with nodularity and mucoid impaction with progression since 2020, LLL consolidation  Cardiac Tests:  Echo 10/08/19 >> EF 60 to 65%, mild LVH, grade 2 DD, mild MR, mild TR  Social History:  She  reports that she quit smoking about 54 years ago. Her smoking use included cigarettes. She has a 2.50 pack-year smoking history. She has never used smokeless tobacco. She reports that she does not drink alcohol and does not use drugs.  Family History:  Her family history includes Cancer in her maternal grandmother; Heart disease in her father and paternal grandfather; Leukemia in her paternal grandmother; Lung cancer in her paternal uncle;  Lymphoma in her mother.     Discussion:  She has long standing history of bronchiectasis in setting of recurrent reflux and aspiration pneumonitis.  She has not been able to tolerate most therapies for this.  She has progressive cough, weight loss, and  frailty.    Assessment/Plan:   Bronchiectasis, centrilobular emphysema. - flutter valve, symbicort, spiriva, albuterol, levalbuterol and hypertonic saline caused palpitations and dizziness - continue mucinex bid - will give her a course of levaquin 250 mg daily for 7 days; might need to consider cyclic antibiotic courses - prn tessalon, delsym - I don't think she could tolerate bronchoscopy given her frail state - she is reluctant to try chest vest  Peripheral neuropathy. - followed by Dr. Karel Jarvis with [facility]  Falling. - she had episode recently - discussed methods to make sure her home environment is safe - advised her to get an alert system in case she falls again   Raynaud's phenomenon. - followed by Rhe[facility]ology at [facility]   GERD with dysphagia. - she will schedule follow up with Dr. Adela Lank with [facility]   Cachexia. - discussed options to keep up with caloric and protein intake  Time Spent Involved in Patient Care on Day of Examination:  39 minutes  Follow up:   Patient Instructions  Levaquin 1 pill daily for 7 days  Follow up in 4 weeks  Medication List:   Allergies as of 04/04/2023       Reactions   Prednisone    Does not remember reaction    Sulfa Antibiotics    Does not remember reaction    Cortisone Other (See Comments)   Does not remember reaction    Sulfonamide Derivatives Other (See Comments)   Does not remember reaction         Medication List        Accurate as of Apr 04, 2023  4:01 PM. If you have any questions, ask your nurse or doctor.          acetaminophen 500 MG tablet Commonly known as: TYLENOL Take 500 mg by mouth daily as needed (pain).   benzonatate 200 MG capsule Commonly known as: TESSALON Take 1 capsule (200 mg total) by mouth 3 (three) times daily as needed for cough.   guaiFENesin 600 MG 12 hr tablet Commonly known as: MUCINEX Take 600 mg by mouth 2 (two) times daily as needed for to  loosen phlegm or cough.   levalbuterol 45 MCG/ACT inhaler Commonly known as: Xopenex HFA Inhale 1 puff into the lungs daily.   levofloxacin 250 MG tablet Commonly known as: Levaquin Take 1 tablet (250 mg total) by mouth daily. Started by: [name], MD   omeprazole 40 MG capsule Commonly known as: PRILOSEC TAKE ONE CAPSULE BY MOUTH TWICE DAILY   pentoxifylline 400 MG CR tablet Commonly known as: TRENTAL Take 1 tablet at night with meals   sucralfate 1 g tablet Commonly known as: CARAFATE Take 1 tablet (1 g total) by mouth every 6 (six) hours as needed. Slowly dissolve 1 tablet in 1 Tbs of water before ingesting   Viactiv Calcium Plus D 650-12.5-40 MG-MCG-MCG Chew Generic drug: Calcium-Vitamin D-Vitamin K Chew 1 each by mouth daily.        Signature:  [name], MD [facility] Pager - [phone] 04/04/2023, 4:01 PM

## 2023-04-12 ENCOUNTER — Telehealth: Payer: Self-pay | Admitting: Neurology

## 2023-04-12 NOTE — Telephone Encounter (Signed)
Pt called back wanting to speak to someone about her fell today and did not hurt her head, but her knee is in pain. Pt heard something popped in it the knee

## 2023-04-12 NOTE — Telephone Encounter (Signed)
She had to bend over to pick something up and her knee popped. Her PCP is going to see her in the morning,

## 2023-04-12 NOTE — Telephone Encounter (Signed)
Pt returned your call.  

## 2023-04-12 NOTE — Telephone Encounter (Signed)
Pt called no answer left a voice mail to call back °

## 2023-04-12 NOTE — Telephone Encounter (Signed)
Pt called in and left a message. She stated she fell and would like to speak with someone about it and get some direction.

## 2023-04-13 ENCOUNTER — Ambulatory Visit
Admission: RE | Admit: 2023-04-13 | Discharge: 2023-04-13 | Disposition: A | Discharge status: Home / Self Care | Payer: Medicare HMO | Source: Ambulatory Visit | Attending: Internal Medicine | Admitting: Internal Medicine

## 2023-04-13 ENCOUNTER — Other Ambulatory Visit: Payer: Self-pay | Admitting: Internal Medicine

## 2023-04-13 DIAGNOSIS — W19XXXA Unspecified fall, initial encounter: Secondary | ICD-10-CM | POA: Diagnosis not present

## 2023-04-13 DIAGNOSIS — G603 Idiopathic progressive neuropathy: Secondary | ICD-10-CM | POA: Diagnosis not present

## 2023-04-13 DIAGNOSIS — M25569 Pain in unspecified knee: Secondary | ICD-10-CM | POA: Diagnosis not present

## 2023-04-13 DIAGNOSIS — M11261 Other chondrocalcinosis, right knee: Secondary | ICD-10-CM | POA: Diagnosis not present

## 2023-04-19 ENCOUNTER — Ambulatory Visit (INDEPENDENT_AMBULATORY_CARE_PROVIDER_SITE_OTHER): Payer: Medicare HMO | Admitting: Podiatry

## 2023-04-19 ENCOUNTER — Telehealth: Payer: Self-pay | Admitting: Neurology

## 2023-04-19 ENCOUNTER — Encounter: Payer: Self-pay | Admitting: Podiatry

## 2023-04-19 DIAGNOSIS — Q828 Other specified congenital malformations of skin: Secondary | ICD-10-CM | POA: Diagnosis not present

## 2023-04-19 DIAGNOSIS — B351 Tinea unguium: Secondary | ICD-10-CM | POA: Diagnosis not present

## 2023-04-19 DIAGNOSIS — L84 Corns and callosities: Secondary | ICD-10-CM

## 2023-04-19 DIAGNOSIS — M79674 Pain in right toe(s): Secondary | ICD-10-CM

## 2023-04-19 DIAGNOSIS — G629 Polyneuropathy, unspecified: Secondary | ICD-10-CM | POA: Diagnosis not present

## 2023-04-19 DIAGNOSIS — M25561 Pain in right knee: Secondary | ICD-10-CM | POA: Diagnosis not present

## 2023-04-19 DIAGNOSIS — M79675 Pain in left toe(s): Secondary | ICD-10-CM | POA: Diagnosis not present

## 2023-04-19 NOTE — Telephone Encounter (Signed)
She has an appt tomorrow, if symptoms worsen before then, go to ER. Thanks

## 2023-04-19 NOTE — Telephone Encounter (Signed)
Patient has an appt 6/5

## 2023-04-19 NOTE — Progress Notes (Signed)
  Subjective:  Patient ID: [name], female    DOB: 22-May-1941,  MRN: [id number]  [name] presents to clinic today for at risk foot care with history of peripheral neuropathy and callus(es) both feet, porokeratotic lesion(s) L 3rd toe, and painful mycotic nails. Painful toenails interfere with ambulation. Aggravating factors include wearing enclosed shoe gear. Pain is relieved with periodic professional debridement. Painful callus(es) and porokeratotic lesion(s) are aggravated when weightbearing with and without shoegear. Pain is relieved with periodic professional debridement.  Chief Complaint  Patient presents with   Nail Problem    Routine foot care, nail and callus trim    New problem(s): None.   PCP is Thana Ates, MD.  Allergies  Allergen Reactions   Prednisone     Does not remember reaction    Sulfa Antibiotics     Does not remember reaction    Cortisone Other (See Comments)    Does not remember reaction    Sulfonamide Derivatives Other (See Comments)    Does not remember reaction     Review of Systems: Negative except as noted in the HPI.  Objective: No changes noted in today's physical examination. There were no vitals filed for this visit. [name] is a pleasant 82 y.o. female thin build in NAD. AAO x 3.  Vascular Examination: Capillary refill time immediate b/l. Vascular status intact b/l with palpable pedal pulses. Pedal hair absent b/l. No pain with calf compression b/l. Skin temperature gradient warm to cool b/l. No ischemia or gangrene noted b/l. Varicosities present b/l. Evidence of chronic venous insufficiency b/l LE.  Neurological Examination: Pt has subjective symptoms of neuropathy. Protective sensation decreased with 10 gram monofilament b/l.  Dermatological Examination:  Pedal skin thin, shiny and atrophic b/l LE. Toenails 1-5 b/l elongated, discolored, dystrophic, thickened, crumbly with subungual debris and tenderness to dorsal palpation.  Hyperkeratotic lesion(s) submet head 1 left foot and 1st metatarsal head right foot.  No erythema, no edema, no drainage, no fluctuance. Porokeratotic lesion(s) submet head 1 left foot. No erythema, no edema, no drainage, no fluctuance.  Musculoskeletal Examination: Muscle strength 5/5 to all lower extremity muscle groups bilaterally. HAV with bunion bilaterally and hammertoes 2-5 b/l. Utilizes cane for ambulation assistance.  Radiographs: None  Assessment/Plan: 1. Pain due to onychomycosis of toenails of both feet   2. Porokeratosis   3. Corns and callosities   4. Neuropathy     -Patient was evaluated and treated. All patient's and/or POA's questions/concerns answered on today's visit. -Patient to continue soft, supportive shoe gear daily. -Toenails 1-5 b/l were debrided in length and girth with sterile nail nippers and dremel without iatrogenic bleeding.  -Callus(es) 1st metatarsal head right foot pared utilizing sterile scalpel blade without complication or incident. Total number debrided =1. -Porokeratotic lesion(s) L 3rd toe and submet head 1 left foot pared and enucleated with sterile scalpel blade. Light bleeding plantar left foot addressed with silver nitrate and triple antibiotic ointment applied. She was instructed to apply Neosporin to area once daily for one week. Call office if she encounters any problems. She related understanding. Total number of lesions debrided=2. -Patient/POA to call should there be question/concern in the interim.   Return in about 3 months (around 07/20/2023).  Freddie Breech, DPM

## 2023-04-19 NOTE — Telephone Encounter (Signed)
Left message with the after hour service on 04-19-23 at 12:46 pm  Caller states that she fell a week ago. This is the fourth fall she has had  she also states that she has been very dizzy since her first fall. She is concern about falling again and she is also having headache and is having confusion   I put the access nurse report in Dr Karel Jarvis box

## 2023-04-20 ENCOUNTER — Ambulatory Visit (INDEPENDENT_AMBULATORY_CARE_PROVIDER_SITE_OTHER): Payer: Medicare HMO | Admitting: Neurology

## 2023-04-20 ENCOUNTER — Encounter: Payer: Self-pay | Admitting: Neurology

## 2023-04-20 VITALS — BP 154/93 | HR 69 | Ht 62.0 in | Wt 77.2 lb

## 2023-04-20 DIAGNOSIS — R296 Repeated falls: Secondary | ICD-10-CM | POA: Diagnosis not present

## 2023-04-20 DIAGNOSIS — M542 Cervicalgia: Secondary | ICD-10-CM

## 2023-04-20 DIAGNOSIS — R42 Dizziness and giddiness: Secondary | ICD-10-CM | POA: Diagnosis not present

## 2023-04-20 NOTE — Telephone Encounter (Signed)
Pt has appointment today at 10am

## 2023-04-20 NOTE — Patient Instructions (Addendum)
Hoping to get some improvement in how you feel.  Schedule MRI brain with and without contrast  2. Schedule MRI cervical spine without contrast  3. Recommend going back to Physical therapy for vestibular therapy and balance therapy (recommendations for walker)  4. Follow-up in 4-6 weeks, call for any changes

## 2023-04-20 NOTE — Progress Notes (Signed)
NEUROLOGY FOLLOW UP OFFICE NOTE  [name] [id number] 10-18-1941  HISTORY OF PRESENT ILLNESS: I had the pleasure of seeing [name] in follow-up in the neurology clinic on 04/20/2023.  The patient was last seen 3 months ago for neuropathy with frequent falls. She called for an earlier visit after a fall a week prior with more dizziness, headache, and confusion. She states she has not been feeling well. She describes a sensation of feeling lightheaded as well as spinning. Sometimes she has a headache. She feels nauseated, no vomiting. She reports 5 falls in the past year, feeling this unwell sensation since the first fall. She injured her right knee with the last fall and saw Ortho yesterday. She was bending down to pick something up and fell, right knee popped out. She states the dizziness lasts all day, not as severe at night. When lying in bed, sometimes she does not fell well. She was lying down and turned over the other night, then rolled and hit her head on the bedside table. She reports neck and back pain. No bowel/bladder dysfunction. She notes BP has been up. She has been "coughing for ages."    HPI: This is a pleasant 82 yo RH woman with a history of GERD, nutcracker esophagus, pulmonary fibrosis, in her usual state of health until early June 2017 while walking into a grocery she suddenly had left temporal pain, then it felt like she was hit on both side of the head with a hammer. She reports she was "hit like gangbusters," she was very dizzy with a spinning sensation, nauseated. She held on to the cart for balance and had difficulty concentrating. Her vision was blurred. She managed to finish her chore and walked to the car, then noticed that she was having a hard time talking. She could see the sentence broken up in her head and could not get the words out. She was able to drive home with no focal symptoms noted. The episode lasted 30 minutes or so, she felt weak and tired after. Since  then she has had at least 2 or 3 more episodes of headaches with dizziness, which did not progress to speech difficulties. The symptoms would last around 5 minutes, no clear positional component to the dizziness. When she feels it coming on, she massages her head, which seems to help. She was unsure if Tylenol helped. She denies any prior history of headaches. She denies any vision loss, photo/phonophobia, diplopia, dysarthria, jaw claudication, hearing loss. She has occasional pulsatile tinnitus more in the afternoons. She has GERD which makes swallowing difficult. She has a history of neuropathy affecting both feet, R>L, with burning and numbness/tingling for several years. She has been doing laser therapy for venous insufficiency/varicose veins. She denies any falls but trips more. Sleep varies. She is very active and rides her bike for a couple of hours daily. She works at a preschool. Her mother had a stroke at age 13.   Diagnostic Data:  Venous dopplers showed chronic venous insufficiency, duplex showed limited valvular insufficiency.    EMG/NCV of the right UE and LE confirmed moderate chronic sensorimotor axonal polyneuropathy. There was also note of moderate right ulnar neuropathy with slowing across the elbow and mild right median neuropathy consistent with carpal tunnel syndrome.   EMG at [facility] showing mild sensory neuropathy and negative punch biopsy for small fiber neuropathy, no evidence of vasculitis.  She has also been evaluated by Rhuematology for Reynauds disease and by  Vascular at [facility] last June 2021. Records were reviewed, she has severe venous insufficiency, mostly in the deep veins. It was discussed how this contributes to majority of her symptoms, including neuropathy. There were severe skin changes compatible with stasis dermatitis and venous stasis. She was prescribed knee high compression stockings.   PAST MEDICAL HISTORY: Past Medical History:  Diagnosis Date    Allergy    Arthritis    Atherosclerosis of aorta (HCC) 2019   ct   BOOP (bronchiolitis obliterans with organizing pneumonia) (HCC) 2003   Bronchiectasis    Cataract    Esophageal dysmotility    Esophageal ulcer    Gastroparesis    GERD (gastroesophageal reflux disease)    severe   Headache    migraines   Hiatal hernia    neuropathy right foot and leg   Iron deficiency anemia    Macular degeneration (senile) of retina    Neuropathy    Nutcracker esophagus    Osteopenia    Pneumonia    Pulmonary fibrosis (HCC)    Raynaud's disease    Recurrent pneumonia    Due to aspiration   Thrombocytosis     MEDICATIONS: Current Outpatient Medications on File Prior to Visit  Medication Sig Dispense Refill   acetaminophen (TYLENOL) 500 MG tablet Take 500 mg by mouth daily as needed (pain).     benzonatate (TESSALON) 200 MG capsule Take 1 capsule (200 mg total) by mouth 3 (three) times daily as needed for cough. 90 capsule 3   Calcium-Vitamin D-Vitamin K (VIACTIV CALCIUM PLUS D) 650-12.5-40 MG-MCG-MCG CHEW Chew 1 each by mouth daily.     guaiFENesin (MUCINEX) 600 MG 12 hr tablet Take 600 mg by mouth 2 (two) times daily as needed for to loosen phlegm or cough.     omeprazole (PRILOSEC) 40 MG capsule TAKE ONE CAPSULE BY MOUTH TWICE DAILY 60 capsule 6   pentoxifylline (TRENTAL) 400 MG CR tablet Take 1 tablet at night with meals 30 tablet 6   sucralfate (CARAFATE) 1 g tablet Take 1 tablet (1 g total) by mouth every 6 (six) hours as needed. Slowly dissolve 1 tablet in 1 Tbs of water before ingesting 60 tablet 3   No current facility-administered medications on file prior to visit.    ALLERGIES: Allergies  Allergen Reactions   Prednisone     Does not remember reaction    Sulfa Antibiotics     Does not remember reaction    Cortisone Other (See Comments)    Does not remember reaction    Sulfonamide Derivatives Other (See Comments)    Does not remember reaction     FAMILY  HISTORY: Family History  Problem Relation Age of Onset   Lymphoma Mother    Heart disease Father    Lung cancer Paternal Uncle    Heart disease Paternal Grandfather    Leukemia Paternal Grandmother    Cancer Maternal Grandmother        unknown type   Colon cancer Neg Hx    Esophageal cancer Neg Hx    Rectal cancer Neg Hx    Stomach cancer Neg Hx     SOCIAL HISTORY: Social History   Socioeconomic History   Marital status: Widowed    Spouse name: Not on file   Number of children: 2   Years of education: Not on file   Highest education level: Not on file  Occupational History   Occupation: retired Runner, broadcasting/film/video    Comment: still teaching pre K  Tobacco Use   Smoking status: Former    Packs/day: 0.25    Years: 10.00    Additional pack years: 0.00    Total pack years: 2.50    Types: Cigarettes    Quit date: 11/15/1968    Years since quitting: 54.4   Smokeless tobacco: Never  Vaping Use   Vaping Use: Never used  Substance and Sexual Activity   Alcohol use: No   Drug use: No   Sexual activity: Not Currently    Partners: Male  Other Topics Concern   Not on file  Social History Narrative   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 - Retired 32yrs      Widowed      Lives alone   Lives one story townhouse      Right handed.   Social Determinants of Health   Financial Resource Strain: Not on file  Food Insecurity: Not on file  Transportation Needs: Not on file  Physical Activity: Not on file  Stress: Not on file  Social Connections: Not on file  Intimate Partner Violence: Not on file     PHYSICAL EXAM: Vitals:   04/20/23 1012  BP: (!) 154/93  Pulse: 69  SpO2: 95%   General: No acute distress, frail appearing Head:  Normocephalic/atraumatic Skin/Extremities: No rash, no edema Neurological Exam: alert and awake. No aphasia or dysarthria. Fund of knowledge is appropriate. Attention and concentration are normal.   Cranial nerves: Pupils equal,  round. Extraocular movements intact with no nystagmus. Visual fields full.  No facial asymmetry.  Motor: Bulk and tone normal, no cogwheeling, muscle strength 4/5 bilateral hip flexors, otherwise 5/5. Reflexes +1 both UE, bilateral patella, absent ankle jerks. Finger to nose testing intact.  Gait slow and cautious, slightly wide-based with cane, no ataxia.    IMPRESSION: This is a pleasant 82 yo RH woman with a history of GERD, nutcracker esophagus, pulmonary fibrosis, with significant neuropathy, presenting for increase in falls with dizziness, headache, and confusion. She has neck and back pain. Exam today shows weakness of both legs. She has significant neuropathy. We discussed different causes of falls, MRI brain with and without contrast to assess for underlying structural abnormality, MRI cervical spine without contrast to assess for myelopathy. She is agreeable to another trial of physical therapy for vestibular therapy. Rolling walker was previously recommended however she has difficulty finding a smaller one. Follow-up in 4-6 weeks, call for any changes.   Thank you for allowing me to participate in her care.  Please do not hesitate to call for any questions or concerns.    Patrcia Dolly, M.D.   CC: Dr. Margaretann Loveless

## 2023-04-27 ENCOUNTER — Ambulatory Visit: Payer: Medicare HMO | Attending: Neurology

## 2023-04-27 ENCOUNTER — Other Ambulatory Visit: Payer: Self-pay

## 2023-04-27 DIAGNOSIS — R42 Dizziness and giddiness: Secondary | ICD-10-CM | POA: Insufficient documentation

## 2023-04-27 DIAGNOSIS — M542 Cervicalgia: Secondary | ICD-10-CM | POA: Insufficient documentation

## 2023-04-27 DIAGNOSIS — R262 Difficulty in walking, not elsewhere classified: Secondary | ICD-10-CM | POA: Insufficient documentation

## 2023-04-27 DIAGNOSIS — R2681 Unsteadiness on feet: Secondary | ICD-10-CM | POA: Insufficient documentation

## 2023-04-27 DIAGNOSIS — R296 Repeated falls: Secondary | ICD-10-CM | POA: Insufficient documentation

## 2023-04-27 DIAGNOSIS — M6281 Muscle weakness (generalized): Secondary | ICD-10-CM | POA: Insufficient documentation

## 2023-04-27 NOTE — Therapy (Signed)
OUTPATIENT PHYSICAL THERAPY VESTIBULAR EVALUATION     Patient Name: [name] MRN: [id number] DOB:[birth year], 82 y.o., female Today's Date: 04/27/2023  END OF SESSION:  PT End of Session - 04/27/23 1318     Visit Number 1    Number of Visits 8    Date for PT Re-Evaluation 05/25/23    Authorization Type [employer] Medicare//BCBS    PT Start Time 1315    PT Stop Time 1400    PT Time Calculation (min) 45 min             Past Medical History:  Diagnosis Date   Allergy    Arthritis    Atherosclerosis of aorta (HCC) 2019   ct   BOOP (bronchiolitis obliterans with organizing pneumonia) (HCC) 2003   Bronchiectasis    Cataract    Esophageal dysmotility    Esophageal ulcer    Gastroparesis    GERD (gastroesophageal reflux disease)    severe   Headache    migraines   Hiatal hernia    neuropathy right foot and leg   Iron deficiency anemia    Macular degeneration (senile) of retina    Neuropathy    Nutcracker esophagus    Osteopenia    Pneumonia    Pulmonary fibrosis (HCC)    Raynaud's disease    Recurrent pneumonia    Due to aspiration   Thrombocytosis    Past Surgical History:  Procedure Laterality Date   48 HOUR PH STUDY N/A 10/13/2015   Procedure: 24 HOUR PH STUDY;  Surgeon: Iva Boop, MD;  Location: [facility];  Service: Endoscopy;  Laterality: N/A;   BREAST LUMPECTOMY     left, x 2   CATARACT EXTRACTION     both eyes   CHOLECYSTECTOMY  2005   COLONOSCOPY     ESOPHAGEAL MANOMETRY N/A 10/06/2015   Procedure: ESOPHAGEAL MANOMETRY (EM);  Surgeon: Ruffin Frederick, MD;  Location: [facility];  Service: Gastroenterology;  Laterality: N/A;   ESOPHAGEAL MANOMETRY N/A 03/10/2018   Procedure: ESOPHAGEAL MANOMETRY (EM);  Surgeon: Napoleon Form, MD;  Location: [facility];  Service: Endoscopy;  Laterality: N/A;   ESOPHAGEAL MANOMETRY N/A 10/15/2020   Procedure: ESOPHAGEAL MANOMETRY (EM);  Surgeon: Shellia Cleverly, DO;  Location: [facility];   Service: Gastroenterology;  Laterality: N/A;   ESOPHAGOGASTRODUODENOSCOPY (EGD) WITH PROPOFOL N/A 08/02/2022   Procedure: ESOPHAGOGASTRODUODENOSCOPY (EGD) WITH PROPOFOL;  Surgeon: Benancio Deeds, MD;  Location: [facility];  Service: Gastroenterology;  Laterality: N/A;   FOREIGN BODY REMOVAL  08/02/2022   Procedure: FOREIGN BODY REMOVAL;  Surgeon: Benancio Deeds, MD;  Location: [facility];  Service: Gastroenterology;;   LUNG BIOPSY     vats right 2003   SAVORY DILATION N/A 08/02/2022   Procedure: SAVORY DILATION;  Surgeon: Benancio Deeds, MD;  Location: [facility];  Service: Gastroenterology;  Laterality: N/A;   TOTAL ABDOMINAL HYSTERECTOMY     UPPER GASTROINTESTINAL ENDOSCOPY     Patient Active Problem List   Diagnosis Date Noted   Osteopenia of multiple sites 01/05/2023   Porokeratosis 01/05/2023   Pain due to onychomycosis of toenails of both feet 01/05/2023   Bilateral dry eyes 01/07/2022   Pain in both lower extremities 12/23/2021   Purple toe syndrome of both feet (HCC) 12/23/2021   Iron deficiency anemia 09/25/2021   Senile purpura (HCC) 09/25/2021   Syndrome of inappropriate secretion of antidiuretic hormone (HCC) 09/25/2021   Macular degeneration 03/18/2021   Choroidal nevus, left eye 01/01/2021  Early stage nonexudative age-related macular degeneration of both eyes 01/01/2021   Thrombocytosis 12/08/2020   Bronchiolectasis (HCC) 11/27/2020   Hardening of the aorta (main artery of the heart) (HCC) 11/27/2020   Idiopathic progressive polyneuropathy 11/27/2020   Intestinal malabsorption 11/27/2020   Malnutrition of mild degree Lily Kocher: 75% to less than 90% of standard weight) (HCC) 11/27/2020   Pulmonary fibrosis (HCC) 11/27/2020   Urge incontinence of urine 11/27/2020   Protein calorie malnutrition (HCC) 11/24/2020   Left epiretinal membrane 04/01/2020   Posterior vitreous detachment of both eyes 04/01/2020   Burning sensation of feet 02/17/2020    Venous insufficiency 01/02/2019   Carpal tunnel syndrome of right wrist 11/24/2018   Sore throat 12/08/2017   Hoarseness 12/08/2017   Abnormal CT scan 12/06/2017   Laryngopharyngeal reflux (LPR) 10/03/2017   Temporomandibular jaw dysfunction 10/03/2017   Tension-type headache, not intractable 09/12/2017   Abdominal pain 04/28/2017   Other fatigue 04/28/2017   Pneumonia 12/14/2016   Neuropathy 07/30/2016   TIA (transient ischemic attack) 06/22/2016   New onset of headaches after age 86 06/22/2016   Dysphagia    Allergic reaction 01/11/2015   Arthritis of right ankle 10/09/2013   Pes cavus 10/09/2013   Loss of transverse plantar arch 10/09/2013   Allergic rhinitis 05/14/2013   BRONCHIECTASIS 12/15/2010   Nutcracker esophagus 01/26/2008   GERD 01/26/2008   GASTROPARESIS 01/26/2008    PCP: Thana Ates REFERRING PROVIDER: Van Clines, MD  REFERRING DIAG: R29.6 (ICD-10-CM) - Frequent falls R42 (ICD-10-CM) - Dizziness M54.2 (ICD-10-CM) - Neck pain  THERAPY DIAG:  Dizziness and giddiness  Unsteadiness on feet  Muscle weakness (generalized)  Difficulty in walking, not elsewhere classified  ONSET DATE: "several years"  Rationale for Evaluation and Treatment: Rehabilitation  SUBJECTIVE:   SUBJECTIVE STATEMENT: "Balance is terrible.  The dizziness has progressed further.  Moving very slowly due to unsteadiness and fear of falling.  Having increased difficulty with stairs.  LLE has difficulty in getting moving.  Reports neuropathy has gotten worse.  Pt reports right side HA and some neck discomfort.  Pt reports "feels like I have a whirly bird in my head, even when sitting.  Sitting during interview rates dizziness as 6-7/10. Pt notes when HA is present her dizziness is worse. Pt reports right ear hurts feeling of heaviness Pt accompanied by: self  PERTINENT HISTORY:  The patient was last seen 3 months ago for neuropathy with frequent falls. She called for an earlier visit  after a fall a week prior with more dizziness, headache, and confusion. She states she has not been feeling well. She describes a sensation of feeling lightheaded as well as spinning. Sometimes she has a headache. She feels nauseated, no vomiting. She reports 5 falls in the past year, feeling this unwell sensation since the first fall. She injured her right knee with the last fall and saw Ortho yesterday. She was bending down to pick something up and fell, right knee popped out. She states the dizziness lasts all day, not as severe at night  PAIN:  Are you having pain? No  PRECAUTIONS: Fall  WEIGHT BEARING RESTRICTIONS: No  FALLS: Has patient fallen in last 6 months? Yes. Number of falls 5  LIVING ENVIRONMENT: Lives with: lives alone Lives in: House/apartment Stairs: No Has following equipment at home: Single point cane  PLOF: Independent with basic ADLs, Independent with household mobility with device, and Independent with community mobility with device  PATIENT GOALS: improve symptoms and improve balance  OBJECTIVE:   DIAGNOSTIC FINDINGS: MRI of head/neck scheduled  COGNITION: Overall cognitive status: Within functional limits for tasks assessed   SENSATION: Not tested, pt reports diminished sensation from knees distal  EDEMA:  Wearing compression wraps on ankles    POSTURE:  No Significant postural limitations and generally frail and cachectic appearing  Cervical ROM:    Active A/PROM (deg) eval  Flexion 50 deg  Extension 50 deg  Right lateral flexion 20 deg  Left lateral flexion 30 deg  Right rotation 45  Left rotation 30  (Blank rows = not tested)  :   LOWER EXTREMITY MMT:   BLE strength 3/5 gross as assessed in sitting  BED MOBILITY:  indep  TRANSFERS: Assistive device utilized: Single point cane and None  Sit to stand: Modified independence Stand to sit: Modified independence Chair to chair: Modified independence Floor: NT    CURB:  CGA  GAIT: Gait pattern: decreased stride length, wide BOS, poor foot clearance- Right, and poor foot clearance- Left Distance walked:  Assistive device utilized: Single point cane Level of assistance: Modified independence and SBA Comments:     VESTIBULAR ASSESSMENT:  GENERAL OBSERVATION: wears progressive lens   SYMPTOM BEHAVIOR:  Subjective history: prolonged hx of dizziness, unsteadiness, and frequent falls  Non-Vestibular symptoms: changes in hearing, neck pain, headaches, tinnitus, and right ear pain  Type of dizziness: "Funny feeling in the head"  Frequency: "all the time"  Duration: "all the time but fluctuates"  Aggravating factors: No known aggravating factors  Relieving factors: no known relieving factors  Progression of symptoms: worse  OCULOMOTOR EXAM:  Ocular Alignment: normal  Ocular ROM: No Limitations  Spontaneous Nystagmus: absent  Gaze-Induced Nystagmus: absent  Smooth Pursuits: intact  Saccades: hypometric/undershoots and slow  Convergence/Divergence: 3 cm     VESTIBULAR - OCULAR REFLEX:   Slow VOR: Comment: reports feeling heaviness in head  VOR Cancellation: Comment: normal response, moves slowly  Head-Impulse Test: HIT Right: negative HIT Left: negative  Dynamic Visual Acuity: Not able to be assessed   POSITIONAL TESTING: Right Dix-Hallpike: unable to assess due to coughing/position Left Dix-Hallpike: no nystagmus, difficult to assess due to coughing Right Roll Test: no nystagmus Left Roll Test: no nystagmus  MOTION SENSITIVITY:  Motion Sensitivity Quotient Intensity: 0 = none, 1 = Lightheaded, 2 = Mild, 3 = Moderate, 4 = Severe, 5 = Vomiting  Intensity  1. Sitting to supine   2. Supine to L side   3. Supine to R side   4. Supine to sitting   5. L Hallpike-Dix   6. Up from L    7. R Hallpike-Dix   8. Up from R    9. Sitting, head tipped to L knee   10. Head up from L knee   11. Sitting, head tipped to R knee   12. Head up from R  knee   13. Sitting head turns x5   14.Sitting head nods x5   15. In stance, 180 turn to L    16. In stance, 180 turn to R     OTHOSTATICS: unable to do  FUNCTIONAL GAIT:  TBD    PATIENT EDUCATION: Education details: need for additional assessment Person educated: Patient Education method: Explanation Education comprehension: verbalized understanding  HOME EXERCISE PROGRAM:  GOALS: Goals reviewed with patient? Yes  SHORT TERM GOALS: Target date: same as LTG   LONG TERM GOALS: Target date: 05/25/2023    The patient will be independent with HEP for gaze adaptation, habituation,  balance, and general mobility. Baseline:  Goal status: INITIAL  2.  Demo mild sway condition 4 M-CTSIB x 15 sec to improve safety in standing during ADL Baseline:  Goal status: INITIAL  3.  Demo BLE gross strength 4/5 to improve stability and activity tolerance Baseline: 3/5 Goal status: INITIAL  4.  Demo improved ambulation tolerance with least restrictive AD per 250 ft distance during Baseline: TBD Goal status: INITIAL    ASSESSMENT:  CLINICAL IMPRESSION: Patient is a 82 y.o. lady who was seen today for physical therapy evaluation and treatment for dizziness and frequent falls.  Patient presents with chronic, constant feeling of dizziness and generalized weakness and reduced activity tolerance.  Exhibits gait deviations and ambulates with unsteadiness and wide BOS.  Unable to tolerate full session due to coughing but did not exhibit positional dizziness in log rolling or left Dix-Hallpike.  Patient would benefit from continued sessions for additional assessment and follow-up interventions   OBJECTIVE IMPAIRMENTS: Abnormal gait, decreased activity tolerance, decreased balance, decreased endurance, decreased knowledge of use of DME, difficulty walking, decreased strength, and dizziness.   ACTIVITY LIMITATIONS: carrying, lifting, bending, standing, reach over head, and locomotion  level  PARTICIPATION LIMITATIONS: meal prep, cleaning, driving, shopping, and community activity  PERSONAL FACTORS: Age, Time since onset of injury/illness/exacerbation, and 3+ comorbidities: PMH  are also affecting patient's functional outcome.   REHAB POTENTIAL: Fair based on time since onset, chronic nature, interaction of conditions  CLINICAL DECISION MAKING: Evolving/moderate complexity  EVALUATION COMPLEXITY: Moderate   PLAN:  PT FREQUENCY: 2x/week  PT DURATION: 4 weeks  PLANNED INTERVENTIONS: Therapeutic exercises, Therapeutic activity, Neuromuscular re-education, Balance training, Gait training, Patient/Family education, Self Care, Joint mobilization, Stair training, Vestibular training, Canalith repositioning, DME instructions, Aquatic Therapy, Dry Needling, Spinal mobilization, Cryotherapy, Moist heat, and Manual therapy  PLAN FOR NEXT SESSION: M-CTSIB, , VOR for HEP   3:18 PM, 04/27/23 M. Shary Decamp, PT, DPT Physical Therapist- [place] Office Number: [phone]

## 2023-04-29 ENCOUNTER — Telehealth: Payer: Self-pay

## 2023-04-29 NOTE — Telephone Encounter (Signed)
Yes, would reschedule to after MRI. Pls let her know report turnaround times have been longer due to staffing issues. Ok to open up 8/5 at 10:30am for f/u with me. Thanks

## 2023-04-29 NOTE — Telephone Encounter (Signed)
Patient called to say she is scheduled for an OV with Aquino on 05/17/23 and an MRI on 06/02/23.  She would like to reschedule the OV for after the MRI. There are no openings on the schedule near that date.   Thanks!

## 2023-04-29 NOTE — Telephone Encounter (Signed)
Pt called no answer unable to leave a voice mail  

## 2023-05-02 ENCOUNTER — Encounter: Payer: Self-pay | Admitting: Physical Therapy

## 2023-05-02 ENCOUNTER — Ambulatory Visit (INDEPENDENT_AMBULATORY_CARE_PROVIDER_SITE_OTHER): Payer: Medicare HMO | Admitting: Pulmonary Disease

## 2023-05-02 ENCOUNTER — Encounter (HOSPITAL_BASED_OUTPATIENT_CLINIC_OR_DEPARTMENT_OTHER): Payer: Self-pay | Admitting: Pulmonary Disease

## 2023-05-02 ENCOUNTER — Telehealth: Payer: Self-pay | Admitting: Neurology

## 2023-05-02 ENCOUNTER — Ambulatory Visit: Payer: Medicare HMO | Admitting: Physical Therapy

## 2023-05-02 VITALS — BP 116/70 | HR 80 | Ht 62.0 in | Wt 74.0 lb

## 2023-05-02 DIAGNOSIS — R42 Dizziness and giddiness: Secondary | ICD-10-CM

## 2023-05-02 DIAGNOSIS — J471 Bronchiectasis with (acute) exacerbation: Secondary | ICD-10-CM | POA: Diagnosis not present

## 2023-05-02 DIAGNOSIS — R262 Difficulty in walking, not elsewhere classified: Secondary | ICD-10-CM | POA: Diagnosis not present

## 2023-05-02 DIAGNOSIS — R2681 Unsteadiness on feet: Secondary | ICD-10-CM

## 2023-05-02 DIAGNOSIS — M6281 Muscle weakness (generalized): Secondary | ICD-10-CM | POA: Diagnosis not present

## 2023-05-02 DIAGNOSIS — M542 Cervicalgia: Secondary | ICD-10-CM | POA: Diagnosis not present

## 2023-05-02 DIAGNOSIS — R296 Repeated falls: Secondary | ICD-10-CM | POA: Diagnosis not present

## 2023-05-02 MED ORDER — AZITHROMYCIN 500 MG PO TABS: 500.0000 mg | ORAL_TABLET | Freq: Every day | ORAL | 0 refills | Status: DC

## 2023-05-02 NOTE — Telephone Encounter (Signed)
Pt scheduled for 8/5 at 10:30 with Dr Karel Jarvis

## 2023-05-02 NOTE — Patient Instructions (Signed)
Zithromax 500 mg pill daily for 5 days  Call next month to get a prescription for levaquin  Follow up in 2 months

## 2023-05-02 NOTE — Progress Notes (Signed)
[facility]  Chief Complaint  Patient presents with   Follow-up    4 wk f/u for bronchiectasis. States she still has the productive cough that causes her to cough so hard that she chokes and becomes SOB.     Constitutional:  BP 116/70   Pulse 80   Ht 5\' 2"  (1.575 m)   Wt 74 lb (33.6 kg)   SpO2 98% Comment: on RA  BMI 13.53 kg/m   Past Medical History:  OA, Esophageal dysmotility, Nutcracker esophagus, Gastroparesis, GERD, Iron deficiency anemia, Neuropathy, Raynaud's  Past Surgical History:  She  has a past surgical history that includes Cholecystectomy (2005); Breast lumpectomy; Total abdominal hysterectomy; Lung biopsy; Cataract extraction; Colonoscopy; Upper gastrointestinal endoscopy; Esophageal manometry (N/A, 10/06/2015); 24 hour ph study (N/A, 10/13/2015); Esophageal manometry (N/A, 03/10/2018); Esophageal manometry (N/A, 10/15/2020); Esophagogastroduodenoscopy (egd) with propofol (N/A, 08/02/2022); Foreign Body Removal (08/02/2022); and Savory dilation (N/A, 08/02/2022).  Brief Summary:  [name] is a 82 y.o. female former smoker with bronchiectasis, recurrent aspiration pneumonia with history of reflux, emphysema and history of cryptogenic organizing pneumonia.      Subjective:   She felt more energy after using levaquin last month.  She was able to renew her driving license.  She continues to have cough with thick, yellow sputum.  Gets into coughing spells and then feels like she is going to chock.  Cough medicines don't help.  She has tried a small amount of rye whiskey and this helped her cough.  She had an episode of dizziness again and fell again.  Physical Exam:   Appearance - thin, frail, uses a cane  ENMT - no sinus tenderness, no oral exudate, no LAN, Mallampati 2 airway, no stridor  Respiratory - decreased breath sounds, scattered rhonchi, no wheeze  CV - s1s2 regular rate and rhythm, no murmurs  Ext - 1+  edema  Skin - multiple areas of ecchymosis  Psych - normal mood and affect       Pulmonary testing:  VATS lung biopsy 2003 >> BOOP PFT 12/31/09 >> FEV1 1.84(84%), FEV1% 73, TLC 4.94(98%), DLCO 77%, +BD. PFT 06/21/16 >> FEV1 1.22 (55%), FEV1% 76, TLC 5.29 (104%), DLCO 43% PFT 12/21/16 >> FEV1 1.49 (69%), FEV1% 78, TLC 5.42 (107%), RV 158%, DLCO 51%  Chest Imaging:  CT chest 08/17/10 >> mild biapical scarring, basilar peribronchovascular nodularity and mild bronchiectasis HRCT chest 11/28/17 >> mild centrilobular emphysema, peribronchovascular nodularity, mild BTX, LLL consolidation CT chest 03/02/18 >> mild emphysema, areas of BTX, several nodular areas up to 1.4 cm CT chest 08/25/18 >> centrilobular emphysema, BTX with wall thickening and tree in bud RML/lingula/bilateral lower lobes, unchanged opacity LLL, new patchy opacities LUL  HRCT chest 09/21/19 >> apical scarring, diffuse BTX with peribronchovascular nodularity, areas of mucoid impaction, coronary calcification HRCT chest 12/30/22 >> apical scarring, centrilobular emphysema, cylindrical BTX in mid and lower lung zones with nodularity and mucoid impaction with progression since 2020, LLL consolidation  Cardiac Tests:  Echo 10/08/19 >> EF 60 to 65%, mild LVH, grade 2 DD, mild MR, mild TR  Social History:  She  reports that she quit smoking about 54 years ago. Her smoking use included cigarettes. She has a 2.50 pack-year smoking history. She has never used smokeless tobacco. She reports that she does not drink alcohol and does not use drugs.  Family History:  Her family history includes Cancer in her maternal grandmother; Heart disease in her father and paternal grandfather; Leukemia  in her paternal grandmother; Lung cancer in her paternal uncle; Lymphoma in her mother.     Discussion:  She has long standing history of bronchiectasis in setting of recurrent reflux and aspiration pneumonitis.  She has not been able to tolerate most  therapies for this.  She has progressive cough, weight loss, and frailty.    Assessment/Plan:   Bronchiectasis, centrilobular emphysema. - flutter valve, symbicort, spiriva, albuterol, levalbuterol and hypertonic saline caused palpitations and dizziness - will start rotating antibiotic schedule - will have her use zithromax 500 mg daily for 5 days - she will call the office in 4 weeks after she finishes zithromax to get another round of levaquin 250 mg daily for 5 days - advised her it is okay to try a small amount of rye whiskey to see if this helps her cough - I don't think she could tolerate bronchoscopy given her frail state - she is reluctant to try chest vest  Peripheral neuropathy. - followed by Dr. Karel Jarvis with [facility]  Falling. - she had episode recently - discussed methods to make sure her home environment is safe - advised her to get an alert system in case she falls again   Raynaud's phenomenon. - followed [facility] at [facility]   GERD with dysphagia. - she will schedule follow up with Dr. Adela Lank with [facility]   Cachexia. - discussed options to keep up with caloric and protein intake  Time Spent Involved in Patient Care on Day of Examination:  36 minutes  Follow up:   Patient Instructions  Zithromax 500 mg pill daily for 5 days  Call next month to get a prescription for levaquin  Follow up in 2 months  Medication List:   Allergies as of 05/02/2023       Reactions   Prednisone    Does not remember reaction    Sulfa Antibiotics    Does not remember reaction    Cortisone Other (See Comments)   Does not remember reaction    Sulfonamide Derivatives Other (See Comments)   Does not remember reaction         Medication List        Accurate as of May 02, 2023  4:38 PM. If you have any questions, ask your nurse or doctor.          acetaminophen 500 MG tablet Commonly known as: TYLENOL Take 500 mg by mouth daily as  needed (pain).   azithromycin 500 MG tablet Commonly known as: Zithromax Take 1 tablet (500 mg total) by mouth daily. Started by: [name], MD   benzonatate 200 MG capsule Commonly known as: TESSALON Take 1 capsule (200 mg total) by mouth 3 (three) times daily as needed for cough.   guaiFENesin 600 MG 12 hr tablet Commonly known as: MUCINEX Take 600 mg by mouth 2 (two) times daily as needed for to loosen phlegm or cough.   omeprazole 40 MG capsule Commonly known as: PRILOSEC TAKE ONE CAPSULE BY MOUTH TWICE DAILY   pentoxifylline 400 MG CR tablet Commonly known as: TRENTAL Take 1 tablet at night with meals   sucralfate 1 g tablet Commonly known as: CARAFATE Take 1 tablet (1 g total) by mouth every 6 (six) hours as needed. Slowly dissolve 1 tablet in 1 Tbs of water before ingesting   Viactiv Calcium Plus D 650-12.5-40 MG-MCG-MCG Chew Generic drug: Calcium-Vitamin D-Vitamin K Chew 1 each by mouth daily.        Signature:  Kashari Chalmers  Craige Cotta, MD [facility] Pager - [phone] 05/02/2023, 4:38 PM

## 2023-05-02 NOTE — Telephone Encounter (Signed)
See other phone note

## 2023-05-02 NOTE — Telephone Encounter (Signed)
Patient called and LM to call her back. I called and had to leave a message

## 2023-05-02 NOTE — Therapy (Signed)
OUTPATIENT PHYSICAL THERAPY VESTIBULAR TREATMENT NOTE     Patient Name: [name] MRN: [id number] DOB:[birth year], 82 y.o., female Today's Date: 05/02/2023  END OF SESSION:  PT End of Session - 05/02/23 1231     Visit Number 2    Number of Visits 8    Date for PT Re-Evaluation 05/25/23    Authorization Type [employer] Medicare//BCBS    PT Start Time 1233    PT Stop Time 1314    PT Time Calculation (min) 41 min             Past Medical History:  Diagnosis Date   Allergy    Arthritis    Atherosclerosis of aorta (HCC) 2019   ct   BOOP (bronchiolitis obliterans with organizing pneumonia) (HCC) 2003   Bronchiectasis    Cataract    Esophageal dysmotility    Esophageal ulcer    Gastroparesis    GERD (gastroesophageal reflux disease)    severe   Headache    migraines   Hiatal hernia    neuropathy right foot and leg   Iron deficiency anemia    Macular degeneration (senile) of retina    Neuropathy    Nutcracker esophagus    Osteopenia    Pneumonia    Pulmonary fibrosis (HCC)    Raynaud's disease    Recurrent pneumonia    Due to aspiration   Thrombocytosis    Past Surgical History:  Procedure Laterality Date   67 HOUR PH STUDY N/A 10/13/2015   Procedure: 24 HOUR PH STUDY;  Surgeon: Iva Boop, MD;  Location: [facility];  Service: Endoscopy;  Laterality: N/A;   BREAST LUMPECTOMY     left, x 2   CATARACT EXTRACTION     both eyes   CHOLECYSTECTOMY  2005   COLONOSCOPY     ESOPHAGEAL MANOMETRY N/A 10/06/2015   Procedure: ESOPHAGEAL MANOMETRY (EM);  Surgeon: Ruffin Frederick, MD;  Location: [facility];  Service: Gastroenterology;  Laterality: N/A;   ESOPHAGEAL MANOMETRY N/A 03/10/2018   Procedure: ESOPHAGEAL MANOMETRY (EM);  Surgeon: Napoleon Form, MD;  Location: [facility];  Service: Endoscopy;  Laterality: N/A;   ESOPHAGEAL MANOMETRY N/A 10/15/2020   Procedure: ESOPHAGEAL MANOMETRY (EM);  Surgeon: Shellia Cleverly, DO;  Location: [facility];  Service: Gastroenterology;  Laterality: N/A;   ESOPHAGOGASTRODUODENOSCOPY (EGD) WITH PROPOFOL N/A 08/02/2022   Procedure: ESOPHAGOGASTRODUODENOSCOPY (EGD) WITH PROPOFOL;  Surgeon: Benancio Deeds, MD;  Location: [facility];  Service: Gastroenterology;  Laterality: N/A;   FOREIGN BODY REMOVAL  08/02/2022   Procedure: FOREIGN BODY REMOVAL;  Surgeon: Benancio Deeds, MD;  Location: [facility];  Service: Gastroenterology;;   LUNG BIOPSY     vats right 2003   SAVORY DILATION N/A 08/02/2022   Procedure: SAVORY DILATION;  Surgeon: Benancio Deeds, MD;  Location: [facility];  Service: Gastroenterology;  Laterality: N/A;   TOTAL ABDOMINAL HYSTERECTOMY     UPPER GASTROINTESTINAL ENDOSCOPY     Patient Active Problem List   Diagnosis Date Noted   Osteopenia of multiple sites 01/05/2023   Porokeratosis 01/05/2023   Pain due to onychomycosis of toenails of both feet 01/05/2023   Bilateral dry eyes 01/07/2022   Pain in both lower extremities 12/23/2021   Purple toe syndrome of both feet (HCC) 12/23/2021   Iron deficiency anemia 09/25/2021   Senile purpura (HCC) 09/25/2021   Syndrome of inappropriate secretion of antidiuretic hormone (HCC) 09/25/2021   Macular degeneration 03/18/2021   Choroidal nevus, left eye 01/01/2021  Early stage nonexudative age-related macular degeneration of both eyes 01/01/2021   Thrombocytosis 12/08/2020   Bronchiolectasis (HCC) 11/27/2020   Hardening of the aorta (main artery of the heart) (HCC) 11/27/2020   Idiopathic progressive polyneuropathy 11/27/2020   Intestinal malabsorption 11/27/2020   Malnutrition of mild degree Lily Kocher: 75% to less than 90% of standard weight) (HCC) 11/27/2020   Pulmonary fibrosis (HCC) 11/27/2020   Urge incontinence of urine 11/27/2020   Protein calorie malnutrition (HCC) 11/24/2020   Left epiretinal membrane 04/01/2020   Posterior vitreous detachment of both eyes 04/01/2020   Burning sensation of feet  02/17/2020   Venous insufficiency 01/02/2019   Carpal tunnel syndrome of right wrist 11/24/2018   Sore throat 12/08/2017   Hoarseness 12/08/2017   Abnormal CT scan 12/06/2017   Laryngopharyngeal reflux (LPR) 10/03/2017   Temporomandibular jaw dysfunction 10/03/2017   Tension-type headache, not intractable 09/12/2017   Abdominal pain 04/28/2017   Other fatigue 04/28/2017   Pneumonia 12/14/2016   Neuropathy 07/30/2016   TIA (transient ischemic attack) 06/22/2016   New onset of headaches after age 35 06/22/2016   Dysphagia    Allergic reaction 01/11/2015   Arthritis of right ankle 10/09/2013   Pes cavus 10/09/2013   Loss of transverse plantar arch 10/09/2013   Allergic rhinitis 05/14/2013   BRONCHIECTASIS 12/15/2010   Nutcracker esophagus 01/26/2008   GERD 01/26/2008   GASTROPARESIS 01/26/2008    PCP: Thana Ates REFERRING PROVIDER: Van Clines, MD  REFERRING DIAG: R29.6 (ICD-10-CM) - Frequent falls R42 (ICD-10-CM) - Dizziness M54.2 (ICD-10-CM) - Neck pain  THERAPY DIAG:  Dizziness and giddiness  Unsteadiness on feet  ONSET DATE: "several years"  Rationale for Evaluation and Treatment: Rehabilitation  SUBJECTIVE:   SUBJECTIVE STATEMENT: Just very dizzy in my head.  Most recent fall was 2-3 weeks ago.  Have had so much dizziness that it makes it hard to walk.  Pt accompanied by: self  PERTINENT HISTORY:  The patient was last seen 3 months ago for neuropathy with frequent falls. She called for an earlier visit after a fall a week prior with more dizziness, headache, and confusion. She states she has not been feeling well. She describes a sensation of feeling lightheaded as well as spinning. Sometimes she has a headache. She feels nauseated, no vomiting. She reports 5 falls in the past year, feeling this unwell sensation since the first fall. She injured her right knee with the last fall and saw Ortho yesterday. She was bending down to pick something up and fell, right  knee popped out. She states the dizziness lasts all day, not as severe at night  PAIN:  Are you having pain? Yes: NPRS scale: 7-8/10 Pain location: R head Pain description: sensitive Aggravating factors: unsure, movement Relieving factors: staying still  PRECAUTIONS: Fall  WEIGHT BEARING RESTRICTIONS: No  FALLS: Has patient fallen in last 6 months? Yes. Number of falls 5  LIVING ENVIRONMENT: Lives with: lives alone Lives in: House/apartment Stairs: No Has following equipment at home: Single point cane  PLOF: Independent with basic ADLs, Independent with household mobility with device, and Independent with community mobility with device  PATIENT GOALS: improve symptoms and improve balance  OBJECTIVE:    M-CTSIB  Condition 1: Firm Surface, EO 30 Sec, Normal and Mild Sway rates dizziness as 7-8/10  Condition 2: Firm Surface, EC 4.25 Sec, Severe Sway  Condition 3: Foam Surface, EO 13.95 Sec, Moderate and Severe Sway  Condition 4: Foam Surface, EC 3.03 Sec, Moderate and Severe Sway  TODAY'S TREATMENT: 05/02/2023 Activity Comments  :  115 ft  With cane, weight more posterior; 7-8/10 dizziness; after 1-2 minutes, pt reports dizziness is not settling  Standing exercises for balance: -heel/toe raises -stagger stance forward/back rocking -minisquats At counter -pt reports already doing heel/toe raises and stagger stance ex  Attempted feet apart with EC holding to counter, x 10 sec Pt does not want to continue due to unsteadiness  Feet apart/feet together head turns/head nods x 5 reps  UE support at counter          -------------------------------------------------------- Objective measures below taken at initial evaluation:  DIAGNOSTIC FINDINGS: MRI of head/neck scheduled  COGNITION: Overall cognitive status: Within functional limits for tasks assessed   SENSATION: Not tested, pt reports diminished sensation from knees distal  EDEMA:  Wearing compression wraps  on ankles    POSTURE:  No Significant postural limitations and generally frail and cachectic appearing  Cervical ROM:    Active A/PROM (deg) eval  Flexion 50 deg  Extension 50 deg  Right lateral flexion 20 deg  Left lateral flexion 30 deg  Right rotation 45  Left rotation 30  (Blank rows = not tested)  :   LOWER EXTREMITY MMT:   BLE strength 3/5 gross as assessed in sitting  BED MOBILITY:  indep  TRANSFERS: Assistive device utilized: Single point cane and None  Sit to stand: Modified independence Stand to sit: Modified independence Chair to chair: Modified independence Floor: NT    CURB: CGA  GAIT: Gait pattern: decreased stride length, wide BOS, poor foot clearance- Right, and poor foot clearance- Left Distance walked:  Assistive device utilized: Single point cane Level of assistance: Modified independence and SBA Comments:     VESTIBULAR ASSESSMENT:  GENERAL OBSERVATION: wears progressive lens   SYMPTOM BEHAVIOR:  Subjective history: prolonged hx of dizziness, unsteadiness, and frequent falls  Non-Vestibular symptoms: changes in hearing, neck pain, headaches, tinnitus, and right ear pain  Type of dizziness: "Funny feeling in the head"  Frequency: "all the time"  Duration: "all the time but fluctuates"  Aggravating factors: No known aggravating factors  Relieving factors: no known relieving factors  Progression of symptoms: worse  OCULOMOTOR EXAM:  Ocular Alignment: normal  Ocular ROM: No Limitations  Spontaneous Nystagmus: absent  Gaze-Induced Nystagmus: absent  Smooth Pursuits: intact  Saccades: hypometric/undershoots and slow  Convergence/Divergence: 3 cm     VESTIBULAR - OCULAR REFLEX:   Slow VOR: Comment: reports feeling heaviness in head  VOR Cancellation: Comment: normal response, moves slowly  Head-Impulse Test: HIT Right: negative HIT Left: negative  Dynamic Visual Acuity: Not able to be assessed   POSITIONAL TESTING: Right  Dix-Hallpike: unable to assess due to coughing/position Left Dix-Hallpike: no nystagmus, difficult to assess due to coughing Right Roll Test: no nystagmus Left Roll Test: no nystagmus  MOTION SENSITIVITY:  Motion Sensitivity Quotient Intensity: 0 = none, 1 = Lightheaded, 2 = Mild, 3 = Moderate, 4 = Severe, 5 = Vomiting  Intensity  1. Sitting to supine   2. Supine to L side   3. Supine to R side   4. Supine to sitting   5. L Hallpike-Dix   6. Up from L    7. R Hallpike-Dix   8. Up from R    9. Sitting, head tipped to L knee   10. Head up from L knee   11. Sitting, head tipped to R knee   12. Head up from R knee   13. Sitting head turns  x5   14.Sitting head nods x5   15. In stance, 180 turn to L    16. In stance, 180 turn to R     OTHOSTATICS: unable to do  FUNCTIONAL GAIT:  TBD    PATIENT EDUCATION: Education details: need for additional assessment Person educated: Patient Education method: Explanation Education comprehension: verbalized understanding  HOME EXERCISE PROGRAM:  GOALS: Goals reviewed with patient? Yes  SHORT TERM GOALS: Target date: same as LTG   LONG TERM GOALS: Target date: 05/25/2023    The patient will be independent with HEP for gaze adaptation, habituation, balance, and general mobility. Baseline:  Goal status: IN PROGRESS  2.  Demo mild sway condition 4 M-CTSIB x 15 sec to improve safety in standing during ADL Baseline: 3.03 sec condition 4 05/02/2023 Goal status: IN PROGRESS  3.  Demo BLE gross strength 4/5 to improve stability and activity tolerance Baseline: 3/5 Goal status: IN PROGRESS  4.  Demo improved ambulation tolerance with least restrictive AD per 250 ft distance during Baseline: TBD Goal status: IN PROGRESS    ASSESSMENT:  CLINICAL IMPRESSION: Assessed MCTSIB today, with pt having difficulty, unable to complete full 30 seconds without dizziness.  2 MWT indicates walking distance of just over >100 ft (115 ft),  with increased feeling of "whirly head" with gait.  Pt very fearful today with varied foot positions and attempts at eyes closed.  She does not complete eyes closed activities more than a few seconds at a time.  She will benefit from continued sessions for further balance and vestibular training.  OBJECTIVE IMPAIRMENTS: Abnormal gait, decreased activity tolerance, decreased balance, decreased endurance, decreased knowledge of use of DME, difficulty walking, decreased strength, and dizziness.   ACTIVITY LIMITATIONS: carrying, lifting, bending, standing, reach over head, and locomotion level  PARTICIPATION LIMITATIONS: meal prep, cleaning, driving, shopping, and community activity  PERSONAL FACTORS: Age, Time since onset of injury/illness/exacerbation, and 3+ comorbidities: PMH  are also affecting patient's functional outcome.   REHAB POTENTIAL: Fair based on time since onset, chronic nature, interaction of conditions  CLINICAL DECISION MAKING: Evolving/moderate complexity  EVALUATION COMPLEXITY: Moderate   PLAN:  PT FREQUENCY: 2x/week  PT DURATION: 4 weeks  PLANNED INTERVENTIONS: Therapeutic exercises, Therapeutic activity, Neuromuscular re-education, Balance training, Gait training, Patient/Family education, Self Care, Joint mobilization, Stair training, Vestibular training, Canalith repositioning, DME instructions, Aquatic Therapy, Dry Needling, Spinal mobilization, Cryotherapy, Moist heat, and Manual therapy  PLAN FOR NEXT SESSION: Try corner/counter balance-working on habituation of EC or feet more narrow.  Head motions and VOR for HEP   Lonia Blood, PT 05/02/23 1:17 PM Phone: [phone] Fax: [phone]  [facility] [address], [address] [employer] [employer], [place] [address] Phone # [phone] Fax # [phone]

## 2023-05-05 ENCOUNTER — Ambulatory Visit: Payer: Medicare HMO

## 2023-05-05 DIAGNOSIS — R262 Difficulty in walking, not elsewhere classified: Secondary | ICD-10-CM

## 2023-05-05 DIAGNOSIS — R42 Dizziness and giddiness: Secondary | ICD-10-CM

## 2023-05-05 DIAGNOSIS — M6281 Muscle weakness (generalized): Secondary | ICD-10-CM | POA: Diagnosis not present

## 2023-05-05 DIAGNOSIS — R296 Repeated falls: Secondary | ICD-10-CM | POA: Diagnosis not present

## 2023-05-05 DIAGNOSIS — M542 Cervicalgia: Secondary | ICD-10-CM | POA: Diagnosis not present

## 2023-05-05 DIAGNOSIS — R2681 Unsteadiness on feet: Secondary | ICD-10-CM | POA: Diagnosis not present

## 2023-05-05 NOTE — Therapy (Signed)
OUTPATIENT PHYSICAL THERAPY VESTIBULAR TREATMENT NOTE     Patient Name: [name] MRN: [id number] DOB:Mar 18, 1941, 82 y.o., female Today's Date: 05/05/2023  END OF SESSION:  PT End of Session - 05/05/23 1528     Visit Number 3    Number of Visits 8    Date for PT Re-Evaluation 05/25/23    Authorization Type [employer] Medicare//BCBS    PT Start Time 1530    PT Stop Time 1615    PT Time Calculation (min) 45 min             Past Medical History:  Diagnosis Date   Allergy    Arthritis    Atherosclerosis of aorta (HCC) 2019   ct   BOOP (bronchiolitis obliterans with organizing pneumonia) (HCC) 2003   Bronchiectasis    Cataract    Esophageal dysmotility    Esophageal ulcer    Gastroparesis    GERD (gastroesophageal reflux disease)    severe   Headache    migraines   Hiatal hernia    neuropathy right foot and leg   Iron deficiency anemia    Macular degeneration (senile) of retina    Neuropathy    Nutcracker esophagus    Osteopenia    Pneumonia    Pulmonary fibrosis (HCC)    Raynaud's disease    Recurrent pneumonia    Due to aspiration   Thrombocytosis    Past Surgical History:  Procedure Laterality Date   44 HOUR PH STUDY N/A 10/13/2015   Procedure: 24 HOUR PH STUDY;  Surgeon: Iva Boop, MD;  Location: [facility];  Service: Endoscopy;  Laterality: N/A;   BREAST LUMPECTOMY     left, x 2   CATARACT EXTRACTION     both eyes   CHOLECYSTECTOMY  2005   COLONOSCOPY     ESOPHAGEAL MANOMETRY N/A 10/06/2015   Procedure: ESOPHAGEAL MANOMETRY (EM);  Surgeon: Ruffin Frederick, MD;  Location: [facility];  Service: Gastroenterology;  Laterality: N/A;   ESOPHAGEAL MANOMETRY N/A 03/10/2018   Procedure: ESOPHAGEAL MANOMETRY (EM);  Surgeon: Napoleon Form, MD;  Location: [facility];  Service: Endoscopy;  Laterality: N/A;   ESOPHAGEAL MANOMETRY N/A 10/15/2020   Procedure: ESOPHAGEAL MANOMETRY (EM);  Surgeon: Shellia Cleverly, DO;  Location: [facility];  Service: Gastroenterology;  Laterality: N/A;   ESOPHAGOGASTRODUODENOSCOPY (EGD) WITH PROPOFOL N/A 08/02/2022   Procedure: ESOPHAGOGASTRODUODENOSCOPY (EGD) WITH PROPOFOL;  Surgeon: Benancio Deeds, MD;  Location: [facility];  Service: Gastroenterology;  Laterality: N/A;   FOREIGN BODY REMOVAL  08/02/2022   Procedure: FOREIGN BODY REMOVAL;  Surgeon: Benancio Deeds, MD;  Location: [facility];  Service: Gastroenterology;;   LUNG BIOPSY     vats right 2003   SAVORY DILATION N/A 08/02/2022   Procedure: SAVORY DILATION;  Surgeon: Benancio Deeds, MD;  Location: [facility];  Service: Gastroenterology;  Laterality: N/A;   TOTAL ABDOMINAL HYSTERECTOMY     UPPER GASTROINTESTINAL ENDOSCOPY     Patient Active Problem List   Diagnosis Date Noted   Osteopenia of multiple sites 01/05/2023   Porokeratosis 01/05/2023   Pain due to onychomycosis of toenails of both feet 01/05/2023   Bilateral dry eyes 01/07/2022   Pain in both lower extremities 12/23/2021   Purple toe syndrome of both feet (HCC) 12/23/2021   Iron deficiency anemia 09/25/2021   Senile purpura (HCC) 09/25/2021   Syndrome of inappropriate secretion of antidiuretic hormone (HCC) 09/25/2021   Macular degeneration 03/18/2021   Choroidal nevus, left eye 01/01/2021  Early stage nonexudative age-related macular degeneration of both eyes 01/01/2021   Thrombocytosis 12/08/2020   Bronchiolectasis (HCC) 11/27/2020   Hardening of the aorta (main artery of the heart) (HCC) 11/27/2020   Idiopathic progressive polyneuropathy 11/27/2020   Intestinal malabsorption 11/27/2020   Malnutrition of mild degree Lily Kocher: 75% to less than 90% of standard weight) (HCC) 11/27/2020   Pulmonary fibrosis (HCC) 11/27/2020   Urge incontinence of urine 11/27/2020   Protein calorie malnutrition (HCC) 11/24/2020   Left epiretinal membrane 04/01/2020   Posterior vitreous detachment of both eyes 04/01/2020   Burning sensation of feet  02/17/2020   Venous insufficiency 01/02/2019   Carpal tunnel syndrome of right wrist 11/24/2018   Sore throat 12/08/2017   Hoarseness 12/08/2017   Abnormal CT scan 12/06/2017   Laryngopharyngeal reflux (LPR) 10/03/2017   Temporomandibular jaw dysfunction 10/03/2017   Tension-type headache, not intractable 09/12/2017   Abdominal pain 04/28/2017   Other fatigue 04/28/2017   Pneumonia 12/14/2016   Neuropathy 07/30/2016   TIA (transient ischemic attack) 06/22/2016   New onset of headaches after age 68 06/22/2016   Dysphagia    Allergic reaction 01/11/2015   Arthritis of right ankle 10/09/2013   Pes cavus 10/09/2013   Loss of transverse plantar arch 10/09/2013   Allergic rhinitis 05/14/2013   BRONCHIECTASIS 12/15/2010   Nutcracker esophagus 01/26/2008   GERD 01/26/2008   GASTROPARESIS 01/26/2008    PCP: Thana Ates REFERRING PROVIDER: Van Clines, MD  REFERRING DIAG: R29.6 (ICD-10-CM) - Frequent falls R42 (ICD-10-CM) - Dizziness M54.2 (ICD-10-CM) - Neck pain  THERAPY DIAG:  Dizziness and giddiness  Unsteadiness on feet  Muscle weakness (generalized)  Difficulty in walking, not elsewhere classified  ONSET DATE: "several years"  Rationale for Evaluation and Treatment: Rehabilitation  SUBJECTIVE:   SUBJECTIVE STATEMENT: Pt notes that if she lays motionless in bed symptoms relieve, return with movements such as turning in bed and position change  Pt accompanied by: self  PERTINENT HISTORY:  The patient was last seen 3 months ago for neuropathy with frequent falls. She called for an earlier visit after a fall a week prior with more dizziness, headache, and confusion. She states she has not been feeling well. She describes a sensation of feeling lightheaded as well as spinning. Sometimes she has a headache. She feels nauseated, no vomiting. She reports 5 falls in the past year, feeling this unwell sensation since the first fall. She injured her right knee with the last  fall and saw Ortho yesterday. She was bending down to pick something up and fell, right knee popped out. She states the dizziness lasts all day, not as severe at night  PAIN:  Are you having pain? Yes: NPRS scale: 7-8/10 Pain location: R head Pain description: sensitive Aggravating factors: unsure, movement Relieving factors: staying still  PRECAUTIONS: Fall  WEIGHT BEARING RESTRICTIONS: No  FALLS: Has patient fallen in last 6 months? Yes. Number of falls 5  LIVING ENVIRONMENT: Lives with: lives alone Lives in: House/apartment Stairs: No Has following equipment at home: Single point cane  PLOF: Independent with basic ADLs, Independent with household mobility with device, and Independent with community mobility with device  PATIENT GOALS: improve symptoms and improve balance  OBJECTIVE:   TODAY'S TREATMENT: 05/05/23 Activity Comments  Brandt-Daroff Attempted but initiated coughing once in sidelying.  Pt notes feeling dizzy with arising from position  Head turns to target 2x5 reps left/right  Body/head turns to target 2x5 reps left/right 180 deg turns  Corner balance Eo/EC 2x30  sec Head turns EO/EC  Sit-stand 1x10 At counter  Gait -CGA with cane and wide BOS, unsteady in turns, CGA ramp negotiation       PATIENT EDUCATION: Education details: need for additional assessment Person educated: Patient Education method: Explanation Education comprehension: verbalized understanding  HOME EXERCISE PROGRAM:   -------------------------------------------------------- Objective measures below taken at initial evaluation:  DIAGNOSTIC FINDINGS: MRI of head/neck scheduled  COGNITION: Overall cognitive status: Within functional limits for tasks assessed   SENSATION: Not tested, pt reports diminished sensation from knees distal  EDEMA:  Wearing compression wraps on ankles    POSTURE:  No Significant postural limitations and generally frail and cachectic  appearing  Cervical ROM:    Active A/PROM (deg) eval  Flexion 50 deg  Extension 50 deg  Right lateral flexion 20 deg  Left lateral flexion 30 deg  Right rotation 45  Left rotation 30  (Blank rows = not tested)  :   LOWER EXTREMITY MMT:   BLE strength 3/5 gross as assessed in sitting  BED MOBILITY:  indep  TRANSFERS: Assistive device utilized: Single point cane and None  Sit to stand: Modified independence Stand to sit: Modified independence Chair to chair: Modified independence Floor: NT    CURB: CGA  GAIT: Gait pattern: decreased stride length, wide BOS, poor foot clearance- Right, and poor foot clearance- Left Distance walked:  Assistive device utilized: Single point cane Level of assistance: Modified independence and SBA Comments:     VESTIBULAR ASSESSMENT:  GENERAL OBSERVATION: wears progressive lens   SYMPTOM BEHAVIOR:  Subjective history: prolonged hx of dizziness, unsteadiness, and frequent falls  Non-Vestibular symptoms: changes in hearing, neck pain, headaches, tinnitus, and right ear pain  Type of dizziness: "Funny feeling in the head"  Frequency: "all the time"  Duration: "all the time but fluctuates"  Aggravating factors: No known aggravating factors  Relieving factors: no known relieving factors  Progression of symptoms: worse  OCULOMOTOR EXAM:  Ocular Alignment: normal  Ocular ROM: No Limitations  Spontaneous Nystagmus: absent  Gaze-Induced Nystagmus: absent  Smooth Pursuits: intact  Saccades: hypometric/undershoots and slow  Convergence/Divergence: 3 cm     VESTIBULAR - OCULAR REFLEX:   Slow VOR: Comment: reports feeling heaviness in head  VOR Cancellation: Comment: normal response, moves slowly  Head-Impulse Test: HIT Right: negative HIT Left: negative  Dynamic Visual Acuity: Not able to be assessed   POSITIONAL TESTING: Right Dix-Hallpike: unable to assess due to coughing/position Left Dix-Hallpike: no nystagmus, difficult  to assess due to coughing Right Roll Test: no nystagmus Left Roll Test: no nystagmus  MOTION SENSITIVITY:  Motion Sensitivity Quotient Intensity: 0 = none, 1 = Lightheaded, 2 = Mild, 3 = Moderate, 4 = Severe, 5 = Vomiting  Intensity  1. Sitting to supine   2. Supine to L side   3. Supine to R side   4. Supine to sitting   5. L Hallpike-Dix   6. Up from L    7. R Hallpike-Dix   8. Up from R    9. Sitting, head tipped to L knee   10. Head up from L knee   11. Sitting, head tipped to R knee   12. Head up from R knee   13. Sitting head turns x5   14.Sitting head nods x5   15. In stance, 180 turn to L    16. In stance, 180 turn to R     OTHOSTATICS: unable to do  FUNCTIONAL GAIT:  TBD  GOALS: Goals reviewed with patient? Yes  SHORT TERM GOALS: Target date: same as LTG   LONG TERM GOALS: Target date: 05/25/2023    The patient will be independent with HEP for gaze adaptation, habituation, balance, and general mobility. Baseline:  Goal status: IN PROGRESS  2.  Demo mild sway condition 4 M-CTSIB x 15 sec to improve safety in standing during ADL Baseline: 3.03 sec condition 4 05/02/2023 Goal status: IN PROGRESS  3.  Demo BLE gross strength 4/5 to improve stability and activity tolerance Baseline: 3/5 Goal status: IN PROGRESS  4.  Demo improved ambulation tolerance with least restrictive AD per 250 ft distance during Baseline: TBD Goal status: IN PROGRESS    ASSESSMENT:  CLINICAL IMPRESSION: Attempted positoining to sidelying as in Brandt-Daroff for testing positional dizziness but coughing prevented maintenance of position.  Pt notes dizzines/lightheadedness is present all the time but made worse with movement/position change. Initiated activities with emphasis on head/body turns to target with pt reporting increase in symptoms with head/body movements.  Turns very slowly with body turns.  Performance of corner balance activities with pt demo mild sway  under eyes closed but reports sensation of severe sway.  Continued sessions to progress POC details and minimize dizziness and unsteadiness  OBJECTIVE IMPAIRMENTS: Abnormal gait, decreased activity tolerance, decreased balance, decreased endurance, decreased knowledge of use of DME, difficulty walking, decreased strength, and dizziness.   ACTIVITY LIMITATIONS: carrying, lifting, bending, standing, reach over head, and locomotion level  PARTICIPATION LIMITATIONS: meal prep, cleaning, driving, shopping, and community activity  PERSONAL FACTORS: Age, Time since onset of injury/illness/exacerbation, and 3+ comorbidities: PMH  are also affecting patient's functional outcome.   REHAB POTENTIAL: Fair based on time since onset, chronic nature, interaction of conditions  CLINICAL DECISION MAKING: Evolving/moderate complexity  EVALUATION COMPLEXITY: Moderate   PLAN:  PT FREQUENCY: 2x/week  PT DURATION: 4 weeks  PLANNED INTERVENTIONS: Therapeutic exercises, Therapeutic activity, Neuromuscular re-education, Balance training, Gait training, Patient/Family education, Self Care, Joint mobilization, Stair training, Vestibular training, Canalith repositioning, DME instructions, Aquatic Therapy, Dry Needling, Spinal mobilization, Cryotherapy, Moist heat, and Manual therapy  PLAN FOR NEXT SESSION: Try corner/counter balance-working on habituation of EC or feet more narrow.  Head motions and VOR for HEP   4:22 PM, 05/05/23 M. Shary Decamp, PT, DPT Physical Therapist- [place] Office Number: [phone]

## 2023-05-06 ENCOUNTER — Ambulatory Visit: Payer: Medicare HMO | Admitting: Neurology

## 2023-05-09 NOTE — Therapy (Signed)
OUTPATIENT PHYSICAL THERAPY VESTIBULAR TREATMENT NOTE     Patient Name: [name] MRN: [id number] DOB:[birth year], 82 y.o., female Today's Date: 05/10/2023  END OF SESSION:  PT End of Session - 05/10/23 1358     Visit Number 3    Number of Visits 8    Date for PT Re-Evaluation 05/25/23    Authorization Type [employer]//BCBS    PT Start Time 1330   pt late   PT Stop Time 1357    PT Time Calculation (min) 27 min    Activity Tolerance Patient tolerated treatment well    Behavior During Therapy Anxious;WFL for tasks assessed/performed              Past Medical History:  Diagnosis Date   Allergy    Arthritis    Atherosclerosis of aorta (HCC) 2019   ct   BOOP (bronchiolitis obliterans with organizing pneumonia) (HCC) 2003   Bronchiectasis    Cataract    Esophageal dysmotility    Esophageal ulcer    Gastroparesis    GERD (gastroesophageal reflux disease)    severe   Headache    migraines   Hiatal hernia    neuropathy right foot and leg   Iron deficiency anemia    Macular degeneration (senile) of retina    Neuropathy    Nutcracker esophagus    Osteopenia    Pneumonia    Pulmonary fibrosis (HCC)    Raynaud's disease    Recurrent pneumonia    Due to aspiration   Thrombocytosis    Past Surgical History:  Procedure Laterality Date   96 HOUR PH STUDY N/A 10/13/2015   Procedure: 24 HOUR PH STUDY;  Surgeon: Iva Boop, MD;  Location: [facility];  Service: Endoscopy;  Laterality: N/A;   BREAST LUMPECTOMY     left, x 2   CATARACT EXTRACTION     both eyes   CHOLECYSTECTOMY  2005   COLONOSCOPY     ESOPHAGEAL MANOMETRY N/A 10/06/2015   Procedure: ESOPHAGEAL MANOMETRY (EM);  Surgeon: Ruffin Frederick, MD;  Location: [facility];  Service: Gastroenterology;  Laterality: N/A;   ESOPHAGEAL MANOMETRY N/A 03/10/2018   Procedure: ESOPHAGEAL MANOMETRY (EM);  Surgeon: Napoleon Form, MD;  Location: [facility];  Service: Endoscopy;  Laterality: N/A;    ESOPHAGEAL MANOMETRY N/A 10/15/2020   Procedure: ESOPHAGEAL MANOMETRY (EM);  Surgeon: Shellia Cleverly, DO;  Location: [facility];  Service: Gastroenterology;  Laterality: N/A;   ESOPHAGOGASTRODUODENOSCOPY (EGD) WITH PROPOFOL N/A 08/02/2022   Procedure: ESOPHAGOGASTRODUODENOSCOPY (EGD) WITH PROPOFOL;  Surgeon: Benancio Deeds, MD;  Location: [facility];  Service: Gastroenterology;  Laterality: N/A;   FOREIGN BODY REMOVAL  08/02/2022   Procedure: FOREIGN BODY REMOVAL;  Surgeon: Benancio Deeds, MD;  Location: [facility];  Service: Gastroenterology;;   LUNG BIOPSY     vats right 2003   SAVORY DILATION N/A 08/02/2022   Procedure: SAVORY DILATION;  Surgeon: Benancio Deeds, MD;  Location: [facility];  Service: Gastroenterology;  Laterality: N/A;   TOTAL ABDOMINAL HYSTERECTOMY     UPPER GASTROINTESTINAL ENDOSCOPY     Patient Active Problem List   Diagnosis Date Noted   Osteopenia of multiple sites 01/05/2023   Porokeratosis 01/05/2023   Pain due to onychomycosis of toenails of both feet 01/05/2023   Bilateral dry eyes 01/07/2022   Pain in both lower extremities 12/23/2021   Purple toe syndrome of both feet (HCC) 12/23/2021   Iron deficiency anemia 09/25/2021   Senile purpura (HCC) 09/25/2021  Syndrome of inappropriate secretion of antidiuretic hormone (HCC) 09/25/2021   Macular degeneration 03/18/2021   Choroidal nevus, left eye 01/01/2021   Early stage nonexudative age-related macular degeneration of both eyes 01/01/2021   Thrombocytosis 12/08/2020   Bronchiolectasis (HCC) 11/27/2020   Hardening of the aorta (main artery of the heart) (HCC) 11/27/2020   Idiopathic progressive polyneuropathy 11/27/2020   Intestinal malabsorption 11/27/2020   Malnutrition of mild degree Lily Kocher: 75% to less than 90% of standard weight) (HCC) 11/27/2020   Pulmonary fibrosis (HCC) 11/27/2020   Urge incontinence of urine 11/27/2020   Protein calorie malnutrition (HCC) 11/24/2020    Left epiretinal membrane 04/01/2020   Posterior vitreous detachment of both eyes 04/01/2020   Burning sensation of feet 02/17/2020   Venous insufficiency 01/02/2019   Carpal tunnel syndrome of right wrist 11/24/2018   Sore throat 12/08/2017   Hoarseness 12/08/2017   Abnormal CT scan 12/06/2017   Laryngopharyngeal reflux (LPR) 10/03/2017   Temporomandibular jaw dysfunction 10/03/2017   Tension-type headache, not intractable 09/12/2017   Abdominal pain 04/28/2017   Other fatigue 04/28/2017   Pneumonia 12/14/2016   Neuropathy 07/30/2016   TIA (transient ischemic attack) 06/22/2016   New onset of headaches after age 87 06/22/2016   Dysphagia    Allergic reaction 01/11/2015   Arthritis of right ankle 10/09/2013   Pes cavus 10/09/2013   Loss of transverse plantar arch 10/09/2013   Allergic rhinitis 05/14/2013   BRONCHIECTASIS 12/15/2010   Nutcracker esophagus 01/26/2008   GERD 01/26/2008   GASTROPARESIS 01/26/2008    PCP: Thana Ates REFERRING PROVIDER: Van Clines, MD  REFERRING DIAG: R29.6 (ICD-10-CM) - Frequent falls R42 (ICD-10-CM) - Dizziness M54.2 (ICD-10-CM) - Neck pain  THERAPY DIAG:  Dizziness and giddiness  Unsteadiness on feet  Muscle weakness (generalized)  Difficulty in walking, not elsewhere classified  ONSET DATE: "several years"  Rationale for Evaluation and Treatment: Rehabilitation  SUBJECTIVE:   SUBJECTIVE STATEMENT: Coughed all night Monday night. That totally exhausted me and it is taking time to get my energy back.   Pt accompanied by: self  PERTINENT HISTORY:  The patient was last seen 3 months ago for neuropathy with frequent falls. She called for an earlier visit after a fall a week prior with more dizziness, headache, and confusion. She states she has not been feeling well. She describes a sensation of feeling lightheaded as well as spinning. Sometimes she has a headache. She feels nauseated, no vomiting. She reports 5 falls in the past  year, feeling this unwell sensation since the first fall. She injured her right knee with the last fall and saw Ortho yesterday. She was bending down to pick something up and fell, right knee popped out. She states the dizziness lasts all day, not as severe at night  PAIN:  Are you having pain? Yes: NPRS scale: "uncomfortable"/10 Pain location: head Pain description: dizzy Aggravating factors: unsure, movement Relieving factors: staying still  PRECAUTIONS: Fall  WEIGHT BEARING RESTRICTIONS: No  FALLS: Has patient fallen in last 6 months? Yes. Number of falls 5  LIVING ENVIRONMENT: Lives with: lives alone Lives in: House/apartment Stairs: No Has following equipment at home: Single point cane  PLOF: Independent with basic ADLs, Independent with household mobility with device, and Independent with community mobility with device  PATIENT GOALS: improve symptoms and improve balance  OBJECTIVE:     TODAY'S TREATMENT: 05/10/23 Activity Comments  STS with chair in front 2x5 UE support on back of chair; limited stability. C/o increased dizziness and muscle  fatigue by end of reps   Standing hip ABD 10x each LE Mini squat 10x Holding onto back of chair  sidestepping in II bars x1 min B UE support; cues to maintain hips/shoulders square   backwards walking in II bars  Very slow and guarded  Balance in corner with chair in front: standing EC 2x30" Romberg 30" Standing  + head turns/nods EO 2x30" each  Pt reports perception of "head swirling" and severe sway but appears fairly steady. Head movements slow and guarded. Requested UE support with head movements d/t fear of falling          -------------------------------------------------------- Objective measures below taken at initial evaluation:  DIAGNOSTIC FINDINGS: MRI of head/neck scheduled  COGNITION: Overall cognitive status: Within functional limits for tasks assessed   SENSATION: Not tested, pt reports diminished  sensation from knees distal  EDEMA:  Wearing compression wraps on ankles    POSTURE:  No Significant postural limitations and generally frail and cachectic appearing  Cervical ROM:    Active A/PROM (deg) eval  Flexion 50 deg  Extension 50 deg  Right lateral flexion 20 deg  Left lateral flexion 30 deg  Right rotation 45  Left rotation 30  (Blank rows = not tested)  :   LOWER EXTREMITY MMT:   BLE strength 3/5 gross as assessed in sitting  BED MOBILITY:  indep  TRANSFERS: Assistive device utilized: Single point cane and None  Sit to stand: Modified independence Stand to sit: Modified independence Chair to chair: Modified independence Floor: NT    CURB: CGA  GAIT: Gait pattern: decreased stride length, wide BOS, poor foot clearance- Right, and poor foot clearance- Left Distance walked:  Assistive device utilized: Single point cane Level of assistance: Modified independence and SBA Comments:     VESTIBULAR ASSESSMENT:  GENERAL OBSERVATION: wears progressive lens   SYMPTOM BEHAVIOR:  Subjective history: prolonged hx of dizziness, unsteadiness, and frequent falls  Non-Vestibular symptoms: changes in hearing, neck pain, headaches, tinnitus, and right ear pain  Type of dizziness: "Funny feeling in the head"  Frequency: "all the time"  Duration: "all the time but fluctuates"  Aggravating factors: No known aggravating factors  Relieving factors: no known relieving factors  Progression of symptoms: worse  OCULOMOTOR EXAM:  Ocular Alignment: normal  Ocular ROM: No Limitations  Spontaneous Nystagmus: absent  Gaze-Induced Nystagmus: absent  Smooth Pursuits: intact  Saccades: hypometric/undershoots and slow  Convergence/Divergence: 3 cm     VESTIBULAR - OCULAR REFLEX:   Slow VOR: Comment: reports feeling heaviness in head  VOR Cancellation: Comment: normal response, moves slowly  Head-Impulse Test: HIT Right: negative HIT Left: negative  Dynamic Visual  Acuity: Not able to be assessed   POSITIONAL TESTING: Right Dix-Hallpike: unable to assess due to coughing/position Left Dix-Hallpike: no nystagmus, difficult to assess due to coughing Right Roll Test: no nystagmus Left Roll Test: no nystagmus  MOTION SENSITIVITY:  Motion Sensitivity Quotient Intensity: 0 = none, 1 = Lightheaded, 2 = Mild, 3 = Moderate, 4 = Severe, 5 = Vomiting  Intensity  1. Sitting to supine   2. Supine to L side   3. Supine to R side   4. Supine to sitting   5. L Hallpike-Dix   6. Up from L    7. R Hallpike-Dix   8. Up from R    9. Sitting, head tipped to L knee   10. Head up from L knee   11. Sitting, head tipped to R knee   12.  Head up from R knee   13. Sitting head turns x5   14.Sitting head nods x5   15. In stance, 180 turn to L    16. In stance, 180 turn to R     OTHOSTATICS: unable to do  FUNCTIONAL GAIT:  TBD      GOALS: Goals reviewed with patient? Yes  SHORT TERM GOALS: Target date: same as LTG   LONG TERM GOALS: Target date: 05/25/2023    The patient will be independent with HEP for gaze adaptation, habituation, balance, and general mobility. Baseline:  Goal status: IN PROGRESS  2.  Demo mild sway condition 4 M-CTSIB x 15 sec to improve safety in standing during ADL Baseline: 3.03 sec condition 4 05/02/2023 Goal status: IN PROGRESS  3.  Demo BLE gross strength 4/5 to improve stability and activity tolerance Baseline: 3/5 Goal status: IN PROGRESS  4.  Demo improved ambulation tolerance with least restrictive AD per 250 ft distance during Baseline: TBD Goal status: IN PROGRESS    ASSESSMENT:  CLINICAL IMPRESSION: Patient arrived to session with report of continued coughing preventing her from sleeping well and noting fatigue as a result. Worked on Oncologist activities- patient apprehensive and guarded with these activities. Reports perception of motion and sway, but appears fairly steady. Particularly slow and  guarded with backwards walking with B UE support but able to complete this despite some fear. Patient tolerated session without complaints.   OBJECTIVE IMPAIRMENTS: Abnormal gait, decreased activity tolerance, decreased balance, decreased endurance, decreased knowledge of use of DME, difficulty walking, decreased strength, and dizziness.   ACTIVITY LIMITATIONS: carrying, lifting, bending, standing, reach over head, and locomotion level  PARTICIPATION LIMITATIONS: meal prep, cleaning, driving, shopping, and community activity  PERSONAL FACTORS: Age, Time since onset of injury/illness/exacerbation, and 3+ comorbidities: PMH  are also affecting patient's functional outcome.   REHAB POTENTIAL: Fair based on time since onset, chronic nature, interaction of conditions  CLINICAL DECISION MAKING: Evolving/moderate complexity  EVALUATION COMPLEXITY: Moderate   PLAN:  PT FREQUENCY: 2x/week  PT DURATION: 4 weeks  PLANNED INTERVENTIONS: Therapeutic exercises, Therapeutic activity, Neuromuscular re-education, Balance training, Gait training, Patient/Family education, Self Care, Joint mobilization, Stair training, Vestibular training, Canalith repositioning, DME instructions, Aquatic Therapy, Dry Needling, Spinal mobilization, Cryotherapy, Moist heat, and Manual therapy  PLAN FOR NEXT SESSION: Try corner/counter balance-working on habituation of EC or feet more narrow.  Head motions and VOR for HEP    Anette Guarneri, PT, DPT 05/10/23 2:00 PM  [facility] at [facility] [address] [place], [address] [place], [place] [address] Phone # [phone] Fax # [phone]

## 2023-05-10 ENCOUNTER — Encounter: Payer: Self-pay | Admitting: Physical Therapy

## 2023-05-10 ENCOUNTER — Ambulatory Visit: Payer: Medicare HMO | Admitting: Physical Therapy

## 2023-05-10 DIAGNOSIS — R296 Repeated falls: Secondary | ICD-10-CM | POA: Diagnosis not present

## 2023-05-10 DIAGNOSIS — R42 Dizziness and giddiness: Secondary | ICD-10-CM | POA: Diagnosis not present

## 2023-05-10 DIAGNOSIS — M542 Cervicalgia: Secondary | ICD-10-CM | POA: Diagnosis not present

## 2023-05-10 DIAGNOSIS — R2681 Unsteadiness on feet: Secondary | ICD-10-CM | POA: Diagnosis not present

## 2023-05-10 DIAGNOSIS — M6281 Muscle weakness (generalized): Secondary | ICD-10-CM | POA: Diagnosis not present

## 2023-05-10 DIAGNOSIS — R262 Difficulty in walking, not elsewhere classified: Secondary | ICD-10-CM | POA: Diagnosis not present

## 2023-05-12 ENCOUNTER — Ambulatory Visit: Payer: Medicare HMO

## 2023-05-12 DIAGNOSIS — M542 Cervicalgia: Secondary | ICD-10-CM | POA: Diagnosis not present

## 2023-05-12 DIAGNOSIS — R296 Repeated falls: Secondary | ICD-10-CM | POA: Diagnosis not present

## 2023-05-12 DIAGNOSIS — M6281 Muscle weakness (generalized): Secondary | ICD-10-CM

## 2023-05-12 DIAGNOSIS — R2681 Unsteadiness on feet: Secondary | ICD-10-CM

## 2023-05-12 DIAGNOSIS — R42 Dizziness and giddiness: Secondary | ICD-10-CM

## 2023-05-12 DIAGNOSIS — R262 Difficulty in walking, not elsewhere classified: Secondary | ICD-10-CM

## 2023-05-12 NOTE — Therapy (Signed)
OUTPATIENT PHYSICAL THERAPY VESTIBULAR TREATMENT NOTE     Patient Name: [name] MRN: [id number] DOB:[birth year], 82 y.o., female Today's Date: 05/12/2023  END OF SESSION:  PT End of Session - 05/12/23 1147     Visit Number 4    Number of Visits 8    Date for PT Re-Evaluation 05/25/23    Authorization Type [employer] Medicare//BCBS    PT Start Time 1145    PT Stop Time 1230    PT Time Calculation (min) 45 min    Activity Tolerance Patient tolerated treatment well    Behavior During Therapy Anxious;WFL for tasks assessed/performed              Past Medical History:  Diagnosis Date   Allergy    Arthritis    Atherosclerosis of aorta (HCC) 2019   ct   BOOP (bronchiolitis obliterans with organizing pneumonia) (HCC) 2003   Bronchiectasis    Cataract    Esophageal dysmotility    Esophageal ulcer    Gastroparesis    GERD (gastroesophageal reflux disease)    severe   Headache    migraines   Hiatal hernia    neuropathy right foot and leg   Iron deficiency anemia    Macular degeneration (senile) of retina    Neuropathy    Nutcracker esophagus    Osteopenia    Pneumonia    Pulmonary fibrosis (HCC)    Raynaud's disease    Recurrent pneumonia    Due to aspiration   Thrombocytosis    Past Surgical History:  Procedure Laterality Date   82 HOUR PH STUDY N/A 10/13/2015   Procedure: 24 HOUR PH STUDY;  Surgeon: Iva Boop, MD;  Location: [facility];  Service: Endoscopy;  Laterality: N/A;   BREAST LUMPECTOMY     left, x 2   CATARACT EXTRACTION     both eyes   CHOLECYSTECTOMY  2005   COLONOSCOPY     ESOPHAGEAL MANOMETRY N/A 10/06/2015   Procedure: ESOPHAGEAL MANOMETRY (EM);  Surgeon: Ruffin Frederick, MD;  Location: [facility];  Service: Gastroenterology;  Laterality: N/A;   ESOPHAGEAL MANOMETRY N/A 03/10/2018   Procedure: ESOPHAGEAL MANOMETRY (EM);  Surgeon: Napoleon Form, MD;  Location: [facility];  Service: Endoscopy;  Laterality: N/A;    ESOPHAGEAL MANOMETRY N/A 10/15/2020   Procedure: ESOPHAGEAL MANOMETRY (EM);  Surgeon: Shellia Cleverly, DO;  Location: [facility];  Service: Gastroenterology;  Laterality: N/A;   ESOPHAGOGASTRODUODENOSCOPY (EGD) WITH PROPOFOL N/A 08/02/2022   Procedure: ESOPHAGOGASTRODUODENOSCOPY (EGD) WITH PROPOFOL;  Surgeon: Benancio Deeds, MD;  Location: [facility];  Service: Gastroenterology;  Laterality: N/A;   FOREIGN BODY REMOVAL  08/02/2022   Procedure: FOREIGN BODY REMOVAL;  Surgeon: Benancio Deeds, MD;  Location: [facility];  Service: Gastroenterology;;   LUNG BIOPSY     vats right 2003   SAVORY DILATION N/A 08/02/2022   Procedure: SAVORY DILATION;  Surgeon: Benancio Deeds, MD;  Location: [facility];  Service: Gastroenterology;  Laterality: N/A;   TOTAL ABDOMINAL HYSTERECTOMY     UPPER GASTROINTESTINAL ENDOSCOPY     Patient Active Problem List   Diagnosis Date Noted   Osteopenia of multiple sites 01/05/2023   Porokeratosis 01/05/2023   Pain due to onychomycosis of toenails of both feet 01/05/2023   Bilateral dry eyes 01/07/2022   Pain in both lower extremities 12/23/2021   Purple toe syndrome of both feet (HCC) 12/23/2021   Iron deficiency anemia 09/25/2021   Senile purpura (HCC) 09/25/2021   Syndrome of  inappropriate secretion of antidiuretic hormone (HCC) 09/25/2021   Macular degeneration 03/18/2021   Choroidal nevus, left eye 01/01/2021   Early stage nonexudative age-related macular degeneration of both eyes 01/01/2021   Thrombocytosis 12/08/2020   Bronchiolectasis (HCC) 11/27/2020   Hardening of the aorta (main artery of the heart) (HCC) 11/27/2020   Idiopathic progressive polyneuropathy 11/27/2020   Intestinal malabsorption 11/27/2020   Malnutrition of mild degree Lily Kocher: 75% to less than 90% of standard weight) (HCC) 11/27/2020   Pulmonary fibrosis (HCC) 11/27/2020   Urge incontinence of urine 11/27/2020   Protein calorie malnutrition (HCC) 11/24/2020   Left  epiretinal membrane 04/01/2020   Posterior vitreous detachment of both eyes 04/01/2020   Burning sensation of feet 02/17/2020   Venous insufficiency 01/02/2019   Carpal tunnel syndrome of right wrist 11/24/2018   Sore throat 12/08/2017   Hoarseness 12/08/2017   Abnormal CT scan 12/06/2017   Laryngopharyngeal reflux (LPR) 10/03/2017   Temporomandibular jaw dysfunction 10/03/2017   Tension-type headache, not intractable 09/12/2017   Abdominal pain 04/28/2017   Other fatigue 04/28/2017   Pneumonia 12/14/2016   Neuropathy 07/30/2016   TIA (transient ischemic attack) 06/22/2016   New onset of headaches after age 18 06/22/2016   Dysphagia    Allergic reaction 01/11/2015   Arthritis of right ankle 10/09/2013   Pes cavus 10/09/2013   Loss of transverse plantar arch 10/09/2013   Allergic rhinitis 05/14/2013   BRONCHIECTASIS 12/15/2010   Nutcracker esophagus 01/26/2008   GERD 01/26/2008   GASTROPARESIS 01/26/2008    PCP: Thana Ates REFERRING PROVIDER: Van Clines, MD  REFERRING DIAG: R29.6 (ICD-10-CM) - Frequent falls R42 (ICD-10-CM) - Dizziness M54.2 (ICD-10-CM) - Neck pain  THERAPY DIAG:  Dizziness and giddiness  Unsteadiness on feet  Muscle weakness (generalized)  Difficulty in walking, not elsewhere classified  ONSET DATE: "several years"  Rationale for Evaluation and Treatment: Rehabilitation  SUBJECTIVE:   SUBJECTIVE STATEMENT: Went to [employer] yesterday and rode elliptical bike  Pt accompanied by: self  PERTINENT HISTORY:  The patient was last seen 3 months ago for neuropathy with frequent falls. She called for an earlier visit after a fall a week prior with more dizziness, headache, and confusion. She states she has not been feeling well. She describes a sensation of feeling lightheaded as well as spinning. Sometimes she has a headache. She feels nauseated, no vomiting. She reports 5 falls in the past year, feeling this unwell sensation since the first fall. She  injured her right knee with the last fall and saw Ortho yesterday. She was bending down to pick something up and fell, right knee popped out. She states the dizziness lasts all day, not as severe at night  PAIN:  Are you having pain? Yes: NPRS scale: "uncomfortable"/10 Pain location: head Pain description: dizzy Aggravating factors: unsure, movement Relieving factors: staying still  PRECAUTIONS: Fall  WEIGHT BEARING RESTRICTIONS: No  FALLS: Has patient fallen in last 6 months? Yes. Number of falls 5  LIVING ENVIRONMENT: Lives with: lives alone Lives in: House/apartment Stairs: No Has following equipment at home: Single point cane  PLOF: Independent with basic ADLs, Independent with household mobility with device, and Independent with community mobility with device  PATIENT GOALS: improve symptoms and improve balance  OBJECTIVE:   TODAY'S TREATMENT: 05/12/23 Activity Comments  Seated LE AROM 20x For warm-up  Seated VOR x 1 Slow pace, increased symptoms right ocular/temporal  balance -feet together EO 2x30 sec  -feet apart EC 2x30 sec -feet together head turns 3x (  finger tip support)  Sidestepping x 2 min For large amplitude movement and single limb support            TODAY'S TREATMENT: 05/10/23 Activity Comments  STS with chair in front 2x5 UE support on back of chair; limited stability. C/o increased dizziness and muscle fatigue by end of reps   Standing hip ABD 10x each LE Mini squat 10x Holding onto back of chair  sidestepping in II bars x1 min B UE support; cues to maintain hips/shoulders square   backwards walking in II bars  Very slow and guarded  Balance in corner with chair in front: standing EC 2x30" Romberg 30" Standing  + head turns/nods EO 2x30" each  Pt reports perception of "head swirling" and severe sway but appears fairly steady. Head movements slow and guarded. Requested UE support with head movements d/t fear of falling           -------------------------------------------------------- Objective measures below taken at initial evaluation:  DIAGNOSTIC FINDINGS: MRI of head/neck scheduled  COGNITION: Overall cognitive status: Within functional limits for tasks assessed   SENSATION: Not tested, pt reports diminished sensation from knees distal  EDEMA:  Wearing compression wraps on ankles    POSTURE:  No Significant postural limitations and generally frail and cachectic appearing  Cervical ROM:    Active A/PROM (deg) eval  Flexion 50 deg  Extension 50 deg  Right lateral flexion 20 deg  Left lateral flexion 30 deg  Right rotation 45  Left rotation 30  (Blank rows = not tested)  :   LOWER EXTREMITY MMT:   BLE strength 3/5 gross as assessed in sitting  BED MOBILITY:  indep  TRANSFERS: Assistive device utilized: Single point cane and None  Sit to stand: Modified independence Stand to sit: Modified independence Chair to chair: Modified independence Floor: NT    CURB: CGA  GAIT: Gait pattern: decreased stride length, wide BOS, poor foot clearance- Right, and poor foot clearance- Left Distance walked:  Assistive device utilized: Single point cane Level of assistance: Modified independence and SBA Comments:     VESTIBULAR ASSESSMENT:  GENERAL OBSERVATION: wears progressive lens   SYMPTOM BEHAVIOR:  Subjective history: prolonged hx of dizziness, unsteadiness, and frequent falls  Non-Vestibular symptoms: changes in hearing, neck pain, headaches, tinnitus, and right ear pain  Type of dizziness: "Funny feeling in the head"  Frequency: "all the time"  Duration: "all the time but fluctuates"  Aggravating factors: No known aggravating factors  Relieving factors: no known relieving factors  Progression of symptoms: worse  OCULOMOTOR EXAM:  Ocular Alignment: normal  Ocular ROM: No Limitations  Spontaneous Nystagmus: absent  Gaze-Induced Nystagmus: absent  Smooth Pursuits:  intact  Saccades: hypometric/undershoots and slow  Convergence/Divergence: 3 cm     VESTIBULAR - OCULAR REFLEX:   Slow VOR: Comment: reports feeling heaviness in head  VOR Cancellation: Comment: normal response, moves slowly  Head-Impulse Test: HIT Right: negative HIT Left: negative  Dynamic Visual Acuity: Not able to be assessed   POSITIONAL TESTING: Right Dix-Hallpike: unable to assess due to coughing/position Left Dix-Hallpike: no nystagmus, difficult to assess due to coughing Right Roll Test: no nystagmus Left Roll Test: no nystagmus  MOTION SENSITIVITY:  Motion Sensitivity Quotient Intensity: 0 = none, 1 = Lightheaded, 2 = Mild, 3 = Moderate, 4 = Severe, 5 = Vomiting  Intensity  1. Sitting to supine   2. Supine to L side   3. Supine to R side   4. Supine to sitting  5. L Hallpike-Dix   6. Up from L    7. R Hallpike-Dix   8. Up from R    9. Sitting, head tipped to L knee   10. Head up from L knee   11. Sitting, head tipped to R knee   12. Head up from R knee   13. Sitting head turns x5   14.Sitting head nods x5   15. In stance, 180 turn to L    16. In stance, 180 turn to R     OTHOSTATICS: unable to do  FUNCTIONAL GAIT:  TBD      GOALS: Goals reviewed with patient? Yes  SHORT TERM GOALS: Target date: same as LTG   LONG TERM GOALS: Target date: 05/25/2023    The patient will be independent with HEP for gaze adaptation, habituation, balance, and general mobility. Baseline:  Goal status: IN PROGRESS  2.  Demo mild sway condition 4 M-CTSIB x 15 sec to improve safety in standing during ADL Baseline: 3.03 sec condition 4 05/02/2023 Goal status: IN PROGRESS  3.  Demo BLE gross strength 4/5 to improve stability and activity tolerance Baseline: 3/5 Goal status: IN PROGRESS  4.  Demo improved ambulation tolerance with least restrictive AD per 250 ft distance during Baseline: TBD Goal status: IN PROGRESS    ASSESSMENT:  CLINICAL  IMPRESSION: Initiated training in gaze stability activities in seated position with pt performing VOR x 1 with slow, hesitant form and notes increase in "whirly feeling on right side of head" with horizontal rotation and feeling of heaviness of head with vertical VOR.  Balance and habituation activities with difficulty in maintaining stability w/ narrow BOS and also eyes closed conditions.  Head turns in standing also provocative to symptoms.  Pt instructed in gentle habituation activities to perform in sitting until able to tolerate in standing. Continued sessions to progress POC details   OBJECTIVE IMPAIRMENTS: Abnormal gait, decreased activity tolerance, decreased balance, decreased endurance, decreased knowledge of use of DME, difficulty walking, decreased strength, and dizziness.   ACTIVITY LIMITATIONS: carrying, lifting, bending, standing, reach over head, and locomotion level  PARTICIPATION LIMITATIONS: meal prep, cleaning, driving, shopping, and community activity  PERSONAL FACTORS: Age, Time since onset of injury/illness/exacerbation, and 3+ comorbidities: PMH  are also affecting patient's functional outcome.   REHAB POTENTIAL: Fair based on time since onset, chronic nature, interaction of conditions  CLINICAL DECISION MAKING: Evolving/moderate complexity  EVALUATION COMPLEXITY: Moderate   PLAN:  PT FREQUENCY: 2x/week  PT DURATION: 4 weeks  PLANNED INTERVENTIONS: Therapeutic exercises, Therapeutic activity, Neuromuscular re-education, Balance training, Gait training, Patient/Family education, Self Care, Joint mobilization, Stair training, Vestibular training, Canalith repositioning, DME instructions, Aquatic Therapy, Dry Needling, Spinal mobilization, Cryotherapy, Moist heat, and Manual therapy  PLAN FOR NEXT SESSION: Try corner/counter balance-working on habituation of EC or feet more narrow.  Head motions and VOR for HEP   12:17 PM, 05/12/23 M. Shary Decamp, PT, DPT Physical  Therapist- [place] Office Number: [phone]

## 2023-05-13 NOTE — Therapy (Signed)
OUTPATIENT PHYSICAL THERAPY VESTIBULAR TREATMENT NOTE     Patient Name: [name] MRN: [id number] DOB:Jul 26, 1941, 82 y.o., female Today's Date: 05/16/2023  END OF SESSION:  PT End of Session - 05/16/23 1649     Visit Number 5    Number of Visits 8    Date for PT Re-Evaluation 05/25/23    Authorization Type [employer]//BCBS    PT Start Time 1617    PT Stop Time 1648   pt requested shortened session d/t needing to be somewhere   PT Time Calculation (min) 31 min    Equipment Utilized During Treatment Gait belt    Activity Tolerance Patient tolerated treatment well    Behavior During Therapy Anxious;WFL for tasks assessed/performed               Past Medical History:  Diagnosis Date   Allergy    Arthritis    Atherosclerosis of aorta (HCC) 2019   ct   BOOP (bronchiolitis obliterans with organizing pneumonia) (HCC) 2003   Bronchiectasis    Cataract    Esophageal dysmotility    Esophageal ulcer    Gastroparesis    GERD (gastroesophageal reflux disease)    severe   Headache    migraines   Hiatal hernia    neuropathy right foot and leg   Iron deficiency anemia    Macular degeneration (senile) of retina    Neuropathy    Nutcracker esophagus    Osteopenia    Pneumonia    Pulmonary fibrosis (HCC)    Raynaud's disease    Recurrent pneumonia    Due to aspiration   Thrombocytosis    Past Surgical History:  Procedure Laterality Date   53 HOUR PH STUDY N/A 10/13/2015   Procedure: 24 HOUR PH STUDY;  Surgeon: Iva Boop, MD;  Location: [facility];  Service: Endoscopy;  Laterality: N/A;   BREAST LUMPECTOMY     left, x 2   CATARACT EXTRACTION     both eyes   CHOLECYSTECTOMY  2005   COLONOSCOPY     ESOPHAGEAL MANOMETRY N/A 10/06/2015   Procedure: ESOPHAGEAL MANOMETRY (EM);  Surgeon: Ruffin Frederick, MD;  Location: [facility];  Service: Gastroenterology;  Laterality: N/A;   ESOPHAGEAL MANOMETRY N/A 03/10/2018   Procedure: ESOPHAGEAL MANOMETRY  (EM);  Surgeon: Napoleon Form, MD;  Location: [facility];  Service: Endoscopy;  Laterality: N/A;   ESOPHAGEAL MANOMETRY N/A 10/15/2020   Procedure: ESOPHAGEAL MANOMETRY (EM);  Surgeon: Shellia Cleverly, DO;  Location: [facility];  Service: Gastroenterology;  Laterality: N/A;   ESOPHAGOGASTRODUODENOSCOPY (EGD) WITH PROPOFOL N/A 08/02/2022   Procedure: ESOPHAGOGASTRODUODENOSCOPY (EGD) WITH PROPOFOL;  Surgeon: Benancio Deeds, MD;  Location: [facility];  Service: Gastroenterology;  Laterality: N/A;   FOREIGN BODY REMOVAL  08/02/2022   Procedure: FOREIGN BODY REMOVAL;  Surgeon: Benancio Deeds, MD;  Location: [facility];  Service: Gastroenterology;;   LUNG BIOPSY     vats right 2003   SAVORY DILATION N/A 08/02/2022   Procedure: SAVORY DILATION;  Surgeon: Benancio Deeds, MD;  Location: [facility];  Service: Gastroenterology;  Laterality: N/A;   TOTAL ABDOMINAL HYSTERECTOMY     UPPER GASTROINTESTINAL ENDOSCOPY     Patient Active Problem List   Diagnosis Date Noted   Osteopenia of multiple sites 01/05/2023   Porokeratosis 01/05/2023   Pain due to onychomycosis of toenails of both feet 01/05/2023   Bilateral dry eyes 01/07/2022   Pain in both lower extremities 12/23/2021   Purple toe syndrome of  both feet (HCC) 12/23/2021   Iron deficiency anemia 09/25/2021   Senile purpura (HCC) 09/25/2021   Syndrome of inappropriate secretion of antidiuretic hormone (HCC) 09/25/2021   Macular degeneration 03/18/2021   Choroidal nevus, left eye 01/01/2021   Early stage nonexudative age-related macular degeneration of both eyes 01/01/2021   Thrombocytosis 12/08/2020   Bronchiolectasis (HCC) 11/27/2020   Hardening of the aorta (main artery of the heart) (HCC) 11/27/2020   Idiopathic progressive polyneuropathy 11/27/2020   Intestinal malabsorption 11/27/2020   Malnutrition of mild degree Lily Kocher: 75% to less than 90% of standard weight) (HCC) 11/27/2020   Pulmonary fibrosis (HCC)  11/27/2020   Urge incontinence of urine 11/27/2020   Protein calorie malnutrition (HCC) 11/24/2020   Left epiretinal membrane 04/01/2020   Posterior vitreous detachment of both eyes 04/01/2020   Burning sensation of feet 02/17/2020   Venous insufficiency 01/02/2019   Carpal tunnel syndrome of right wrist 11/24/2018   Sore throat 12/08/2017   Hoarseness 12/08/2017   Abnormal CT scan 12/06/2017   Laryngopharyngeal reflux (LPR) 10/03/2017   Temporomandibular jaw dysfunction 10/03/2017   Tension-type headache, not intractable 09/12/2017   Abdominal pain 04/28/2017   Other fatigue 04/28/2017   Pneumonia 12/14/2016   Neuropathy 07/30/2016   TIA (transient ischemic attack) 06/22/2016   New onset of headaches after age 29 06/22/2016   Dysphagia    Allergic reaction 01/11/2015   Arthritis of right ankle 10/09/2013   Pes cavus 10/09/2013   Loss of transverse plantar arch 10/09/2013   Allergic rhinitis 05/14/2013   BRONCHIECTASIS 12/15/2010   Nutcracker esophagus 01/26/2008   GERD 01/26/2008   GASTROPARESIS 01/26/2008    PCP: Thana Ates REFERRING PROVIDER: Van Clines, MD  REFERRING DIAG: R29.6 (ICD-10-CM) - Frequent falls R42 (ICD-10-CM) - Dizziness M54.2 (ICD-10-CM) - Neck pain  THERAPY DIAG:  Dizziness and giddiness  Unsteadiness on feet  Muscle weakness (generalized)  Difficulty in walking, not elsewhere classified  ONSET DATE: "several years"  Rationale for Evaluation and Treatment: Rehabilitation  SUBJECTIVE:   SUBJECTIVE STATEMENT: Going okay. Nothing new. Reports that today is the first day in 2 months that she is less dizzy. Requesting exercises to work on strengthening her legs.   Pt accompanied by: self  PERTINENT HISTORY:  The patient was last seen 3 months ago for neuropathy with frequent falls. She called for an earlier visit after a fall a week prior with more dizziness, headache, and confusion. She states she has not been feeling well. She  describes a sensation of feeling lightheaded as well as spinning. Sometimes she has a headache. She feels nauseated, no vomiting. She reports 5 falls in the past year, feeling this unwell sensation since the first fall. She injured her right knee with the last fall and saw Ortho yesterday. She was bending down to pick something up and fell, right knee popped out. She states the dizziness lasts all day, not as severe at night  PAIN:  Are you having pain? Yes: NPRS scale: 0/10 Pain location: head Pain description: dizzy Aggravating factors: unsure, movement Relieving factors: staying still  PRECAUTIONS: Fall  WEIGHT BEARING RESTRICTIONS: No  FALLS: Has patient fallen in last 6 months? Yes. Number of falls 5  LIVING ENVIRONMENT: Lives with: lives alone Lives in: House/apartment Stairs: No Has following equipment at home: Single point cane  PLOF: Independent with basic ADLs, Independent with household mobility with device, and Independent with community mobility with device  PATIENT GOALS: improve symptoms and improve balance  OBJECTIVE:  TODAY'S TREATMENT: 05/16/23 Activity Comments  in II bars:   alt posterior step B UE support throughout   standing EC 2x30" With B UE support   step ups on 6" stool 2x5 each LE B UE support; focus on slow step backs; slightly more challenging on L LE  mini squat + side step out/in Verbal cueing to maintain proper sequencing of movements and maintain squat position throughout   STS with red medball at chest  Pt reports she cannot do this  STS 5x Pushing off armrests     HOME EXERCISE PROGRAM Last updated: 05/16/23 Access Code: [email or web address] URL: [email or web address] Date: 05/16/2023 Prepared by: [facility] Outpatient  Rehab - [facility]  Program Notes start at the lowest weight and slowly build up from there. try to perform 2 sets of 10 at a weight that you can maintain good form with.  Exercises - Hamstring Curl with  Weight Machine  - 1 x daily - 5 x weekly - 2 sets - 10 reps - Knee Extension with Weight Machine  - 1 x daily - 5 x weekly - 2 sets - 10 reps - Full Leg Press  - 1 x daily - 5 x weekly - 2 sets - 10 reps - Hip Abduction Machine  - 1 x daily - 5 x weekly - 2 sets - 10 reps - Hip Adduction Machine  - 1 x daily - 5 x weekly - 2 sets - 10 reps   PATIENT EDUCATION: Education details: edu on weight machines to address strength at [facility] with instruction for safety and encouraged her ot reach out to gym staff to assist with equipment  Person educated: Patient Education method: Explanation, Demonstration, Tactile cues, Verbal cues, and Handouts Education comprehension: verbalized understanding and returned demonstration    -------------------------------------------------------- Objective measures below taken at initial evaluation:  DIAGNOSTIC FINDINGS: MRI of head/neck scheduled  COGNITION: Overall cognitive status: Within functional limits for tasks assessed   SENSATION: Not tested, pt reports diminished sensation from knees distal  EDEMA:  Wearing compression wraps on ankles    POSTURE:  No Significant postural limitations and generally frail and cachectic appearing  Cervical ROM:    Active A/PROM (deg) eval  Flexion 50 deg  Extension 50 deg  Right lateral flexion 20 deg  Left lateral flexion 30 deg  Right rotation 45  Left rotation 30  (Blank rows = not tested)  :   LOWER EXTREMITY MMT:   BLE strength 3/5 gross as assessed in sitting  BED MOBILITY:  indep  TRANSFERS: Assistive device utilized: Single point cane and None  Sit to stand: Modified independence Stand to sit: Modified independence Chair to chair: Modified independence Floor: NT    CURB: CGA  GAIT: Gait pattern: decreased stride length, wide BOS, poor foot clearance- Right, and poor foot clearance- Left Distance walked:  Assistive device utilized: Single point cane Level of assistance:  Modified independence and SBA Comments:     VESTIBULAR ASSESSMENT:  GENERAL OBSERVATION: wears progressive lens   SYMPTOM BEHAVIOR:  Subjective history: prolonged hx of dizziness, unsteadiness, and frequent falls  Non-Vestibular symptoms: changes in hearing, neck pain, headaches, tinnitus, and right ear pain  Type of dizziness: "Funny feeling in the head"  Frequency: "all the time"  Duration: "all the time but fluctuates"  Aggravating factors: No known aggravating factors  Relieving factors: no known relieving factors  Progression of symptoms: worse  OCULOMOTOR EXAM:  Ocular Alignment: normal  Ocular ROM: No Limitations  Spontaneous  Nystagmus: absent  Gaze-Induced Nystagmus: absent  Smooth Pursuits: intact  Saccades: hypometric/undershoots and slow  Convergence/Divergence: 3 cm     VESTIBULAR - OCULAR REFLEX:   Slow VOR: Comment: reports feeling heaviness in head  VOR Cancellation: Comment: normal response, moves slowly  Head-Impulse Test: HIT Right: negative HIT Left: negative  Dynamic Visual Acuity: Not able to be assessed   POSITIONAL TESTING: Right Dix-Hallpike: unable to assess due to coughing/position Left Dix-Hallpike: no nystagmus, difficult to assess due to coughing Right Roll Test: no nystagmus Left Roll Test: no nystagmus  MOTION SENSITIVITY:  Motion Sensitivity Quotient Intensity: 0 = none, 1 = Lightheaded, 2 = Mild, 3 = Moderate, 4 = Severe, 5 = Vomiting  Intensity  1. Sitting to supine   2. Supine to L side   3. Supine to R side   4. Supine to sitting   5. L Hallpike-Dix   6. Up from L    7. R Hallpike-Dix   8. Up from R    9. Sitting, head tipped to L knee   10. Head up from L knee   11. Sitting, head tipped to R knee   12. Head up from R knee   13. Sitting head turns x5   14.Sitting head nods x5   15. In stance, 180 turn to L    16. In stance, 180 turn to R     OTHOSTATICS: unable to do  FUNCTIONAL GAIT:  TBD      GOALS: Goals  reviewed with patient? Yes  SHORT TERM GOALS: Target date: same as LTG   LONG TERM GOALS: Target date: 05/25/2023    The patient will be independent with HEP for gaze adaptation, habituation, balance, and general mobility. Baseline:  Goal status: IN PROGRESS  2.  Demo mild sway condition 4 M-CTSIB x 15 sec to improve safety in standing during ADL Baseline: 3.03 sec condition 4 05/02/2023 Goal status: IN PROGRESS  3.  Demo BLE gross strength 4/5 to improve stability and activity tolerance Baseline: 3/5 Goal status: IN PROGRESS  4.  Demo improved ambulation tolerance with least restrictive AD per 250 ft distance during Baseline: TBD Goal status: IN PROGRESS    ASSESSMENT:  CLINICAL IMPRESSION: Patient arrived to session with report of decreased dizziness today. Patient requesting to work on strengthening activities today, thus worked on functional strengthening activities in parallel bars for max safety and balance confidence. Patient slow moving and cautious but overall with good form with today's activities. Provided education on safe was to progressively strengthen LEs. Patient requested shortened session today. No complaints upon leaving.   OBJECTIVE IMPAIRMENTS: Abnormal gait, decreased activity tolerance, decreased balance, decreased endurance, decreased knowledge of use of DME, difficulty walking, decreased strength, and dizziness.   ACTIVITY LIMITATIONS: carrying, lifting, bending, standing, reach over head, and locomotion level  PARTICIPATION LIMITATIONS: meal prep, cleaning, driving, shopping, and community activity  PERSONAL FACTORS: Age, Time since onset of injury/illness/exacerbation, and 3+ comorbidities: PMH  are also affecting patient's functional outcome.   REHAB POTENTIAL: Fair based on time since onset, chronic nature, interaction of conditions  CLINICAL DECISION MAKING: Evolving/moderate complexity  EVALUATION COMPLEXITY: Moderate   PLAN:  PT  FREQUENCY: 2x/week  PT DURATION: 4 weeks  PLANNED INTERVENTIONS: Therapeutic exercises, Therapeutic activity, Neuromuscular re-education, Balance training, Gait training, Patient/Family education, Self Care, Joint mobilization, Stair training, Vestibular training, Canalith repositioning, DME instructions, Aquatic Therapy, Dry Needling, Spinal mobilization, Cryotherapy, Moist heat, and Manual therapy  PLAN FOR NEXT SESSION: Try  corner/counter balance-working on habituation of EC or feet more narrow.  Head motions and VOR for HEP   Anette Guarneri, PT, DPT 05/16/23 4:53 PM  [facility] [address] [place], [address] [place], [place] [address] Phone # [phone] Fax # [phone]

## 2023-05-15 ENCOUNTER — Other Ambulatory Visit: Payer: Self-pay | Admitting: Gastroenterology

## 2023-05-16 ENCOUNTER — Encounter: Payer: Self-pay | Admitting: Physical Therapy

## 2023-05-16 ENCOUNTER — Ambulatory Visit: Payer: Medicare HMO | Attending: Neurology | Admitting: Physical Therapy

## 2023-05-16 DIAGNOSIS — R2681 Unsteadiness on feet: Secondary | ICD-10-CM | POA: Diagnosis not present

## 2023-05-16 DIAGNOSIS — R262 Difficulty in walking, not elsewhere classified: Secondary | ICD-10-CM | POA: Diagnosis not present

## 2023-05-16 DIAGNOSIS — R42 Dizziness and giddiness: Secondary | ICD-10-CM | POA: Diagnosis not present

## 2023-05-16 DIAGNOSIS — M6281 Muscle weakness (generalized): Secondary | ICD-10-CM | POA: Diagnosis not present

## 2023-05-17 ENCOUNTER — Ambulatory Visit: Payer: Medicare HMO | Admitting: Neurology

## 2023-05-18 ENCOUNTER — Ambulatory Visit: Payer: Medicare HMO | Admitting: Physical Therapy

## 2023-05-18 ENCOUNTER — Encounter: Payer: Self-pay | Admitting: Physical Therapy

## 2023-05-18 DIAGNOSIS — R42 Dizziness and giddiness: Secondary | ICD-10-CM | POA: Diagnosis not present

## 2023-05-18 DIAGNOSIS — R2681 Unsteadiness on feet: Secondary | ICD-10-CM | POA: Diagnosis not present

## 2023-05-18 DIAGNOSIS — R262 Difficulty in walking, not elsewhere classified: Secondary | ICD-10-CM | POA: Diagnosis not present

## 2023-05-18 DIAGNOSIS — M6281 Muscle weakness (generalized): Secondary | ICD-10-CM | POA: Diagnosis not present

## 2023-05-18 NOTE — Therapy (Signed)
OUTPATIENT PHYSICAL THERAPY VESTIBULAR TREATMENT NOTE     Patient Name: [name] MRN: [id number] DOB:[birth year], 82 y.o., female Today's Date: 05/18/2023  END OF SESSION:  PT End of Session - 05/18/23 1622     Visit Number 6    Number of Visits 8    Date for PT Re-Evaluation 05/25/23    Authorization Type [employer] Medicare//BCBS    PT Start Time 1624   pt arrives late   PT Stop Time 1700    PT Time Calculation (min) 36 min    Equipment Utilized During Treatment Gait belt    Activity Tolerance Patient tolerated treatment well    Behavior During Therapy Anxious;WFL for tasks assessed/performed                Past Medical History:  Diagnosis Date   Allergy    Arthritis    Atherosclerosis of aorta (HCC) 2019   ct   BOOP (bronchiolitis obliterans with organizing pneumonia) (HCC) 2003   Bronchiectasis    Cataract    Esophageal dysmotility    Esophageal ulcer    Gastroparesis    GERD (gastroesophageal reflux disease)    severe   Headache    migraines   Hiatal hernia    neuropathy right foot and leg   Iron deficiency anemia    Macular degeneration (senile) of retina    Neuropathy    Nutcracker esophagus    Osteopenia    Pneumonia    Pulmonary fibrosis (HCC)    Raynaud's disease    Recurrent pneumonia    Due to aspiration   Thrombocytosis    Past Surgical History:  Procedure Laterality Date   92 HOUR PH STUDY N/A 10/13/2015   Procedure: 24 HOUR PH STUDY;  Surgeon: Iva Boop, MD;  Location: [facility];  Service: Endoscopy;  Laterality: N/A;   BREAST LUMPECTOMY     left, x 2   CATARACT EXTRACTION     both eyes   CHOLECYSTECTOMY  2005   COLONOSCOPY     ESOPHAGEAL MANOMETRY N/A 10/06/2015   Procedure: ESOPHAGEAL MANOMETRY (EM);  Surgeon: Ruffin Frederick, MD;  Location: [facility];  Service: Gastroenterology;  Laterality: N/A;   ESOPHAGEAL MANOMETRY N/A 03/10/2018   Procedure: ESOPHAGEAL MANOMETRY (EM);  Surgeon: Napoleon Form, MD;   Location: [facility];  Service: Endoscopy;  Laterality: N/A;   ESOPHAGEAL MANOMETRY N/A 10/15/2020   Procedure: ESOPHAGEAL MANOMETRY (EM);  Surgeon: Shellia Cleverly, DO;  Location: [facility];  Service: Gastroenterology;  Laterality: N/A;   ESOPHAGOGASTRODUODENOSCOPY (EGD) WITH PROPOFOL N/A 08/02/2022   Procedure: ESOPHAGOGASTRODUODENOSCOPY (EGD) WITH PROPOFOL;  Surgeon: Benancio Deeds, MD;  Location: [facility];  Service: Gastroenterology;  Laterality: N/A;   FOREIGN BODY REMOVAL  08/02/2022   Procedure: FOREIGN BODY REMOVAL;  Surgeon: Benancio Deeds, MD;  Location: [facility];  Service: Gastroenterology;;   LUNG BIOPSY     vats right 2003   SAVORY DILATION N/A 08/02/2022   Procedure: SAVORY DILATION;  Surgeon: Benancio Deeds, MD;  Location: [facility];  Service: Gastroenterology;  Laterality: N/A;   TOTAL ABDOMINAL HYSTERECTOMY     UPPER GASTROINTESTINAL ENDOSCOPY     Patient Active Problem List   Diagnosis Date Noted   Osteopenia of multiple sites 01/05/2023   Porokeratosis 01/05/2023   Pain due to onychomycosis of toenails of both feet 01/05/2023   Bilateral dry eyes 01/07/2022   Pain in both lower extremities 12/23/2021   Purple toe syndrome of both feet (HCC) 12/23/2021  Iron deficiency anemia 09/25/2021   Senile purpura (HCC) 09/25/2021   Syndrome of inappropriate secretion of antidiuretic hormone (HCC) 09/25/2021   Macular degeneration 03/18/2021   Choroidal nevus, left eye 01/01/2021   Early stage nonexudative age-related macular degeneration of both eyes 01/01/2021   Thrombocytosis 12/08/2020   Bronchiolectasis (HCC) 11/27/2020   Hardening of the aorta (main artery of the heart) (HCC) 11/27/2020   Idiopathic progressive polyneuropathy 11/27/2020   Intestinal malabsorption 11/27/2020   Malnutrition of mild degree Lily Kocher: 75% to less than 90% of standard weight) (HCC) 11/27/2020   Pulmonary fibrosis (HCC) 11/27/2020   Urge incontinence of urine  11/27/2020   Protein calorie malnutrition (HCC) 11/24/2020   Left epiretinal membrane 04/01/2020   Posterior vitreous detachment of both eyes 04/01/2020   Burning sensation of feet 02/17/2020   Venous insufficiency 01/02/2019   Carpal tunnel syndrome of right wrist 11/24/2018   Sore throat 12/08/2017   Hoarseness 12/08/2017   Abnormal CT scan 12/06/2017   Laryngopharyngeal reflux (LPR) 10/03/2017   Temporomandibular jaw dysfunction 10/03/2017   Tension-type headache, not intractable 09/12/2017   Abdominal pain 04/28/2017   Other fatigue 04/28/2017   Pneumonia 12/14/2016   Neuropathy 07/30/2016   TIA (transient ischemic attack) 06/22/2016   New onset of headaches after age 78 06/22/2016   Dysphagia    Allergic reaction 01/11/2015   Arthritis of right ankle 10/09/2013   Pes cavus 10/09/2013   Loss of transverse plantar arch 10/09/2013   Allergic rhinitis 05/14/2013   BRONCHIECTASIS 12/15/2010   Nutcracker esophagus 01/26/2008   GERD 01/26/2008   GASTROPARESIS 01/26/2008    PCP: Thana Ates REFERRING PROVIDER: Van Clines, MD  REFERRING DIAG: R29.6 (ICD-10-CM) - Frequent falls R42 (ICD-10-CM) - Dizziness M54.2 (ICD-10-CM) - Neck pain  THERAPY DIAG:  Unsteadiness on feet  Dizziness and giddiness  Muscle weakness (generalized)  ONSET DATE: "several years"  Rationale for Evaluation and Treatment: Rehabilitation  SUBJECTIVE:   SUBJECTIVE STATEMENT: Tired today.  Still on a positive note in regards to dizziness.  Being cautious-don't want to stir it up.  Pt accompanied by: self  PERTINENT HISTORY:  The patient was last seen 3 months ago for neuropathy with frequent falls. She called for an earlier visit after a fall a week prior with more dizziness, headache, and confusion. She states she has not been feeling well. She describes a sensation of feeling lightheaded as well as spinning. Sometimes she has a headache. She feels nauseated, no vomiting. She reports 5  falls in the past year, feeling this unwell sensation since the first fall. She injured her right knee with the last fall and saw Ortho yesterday. She was bending down to pick something up and fell, right knee popped out. She states the dizziness lasts all day, not as severe at night  PAIN:  Are you having pain? Yes: NPRS scale: 0/10 Pain location: head Pain description: dizzy Aggravating factors: unsure, movement Relieving factors: staying still  PRECAUTIONS: Fall  WEIGHT BEARING RESTRICTIONS: No  FALLS: Has patient fallen in last 6 months? Yes. Number of falls 5  LIVING ENVIRONMENT: Lives with: lives alone Lives in: House/apartment Stairs: No Has following equipment at home: Single point cane  PLOF: Independent with basic ADLs, Independent with household mobility with device, and Independent with community mobility with device  PATIENT GOALS: improve symptoms and improve balance  OBJECTIVE:     TODAY'S TREATMENT: 05/16/23 Activity Comments  in II bars:   -alt posterior step x 10 -Alt forward step, x  10 -Forward>back step and weightshift x 5  B UE support throughout      step ups on 6" stool 2x5 each LE B UE support; focus on slow step backs; slightly more challenging on L LE  Side step squat x 10 reps each leg leading Verbal cueing to maintain proper sequencing of movements  Forward step ups to 6" step, 2 x 3 reps BUE support  STS 5x Pushing off armrests   Gait x 170 ft (125 ft in 2 minutes) With cane, min guard    HOME EXERCISE PROGRAM Last updated: 05/16/23 Access Code: [email or web address] URL: [email or web address] Date: 05/16/2023 Prepared by: [facility] - Outpatient  Rehab - [facility]  Program Notes start at the lowest weight and slowly build up from there. try to perform 2 sets of 10 at a weight that you can maintain good form with.  Exercises - Hamstring Curl with Weight Machine  - 1 x daily - 5 x weekly - 2 sets - 10 reps - Knee Extension with  Weight Machine  - 1 x daily - 5 x weekly - 2 sets - 10 reps - Full Leg Press  - 1 x daily - 5 x weekly - 2 sets - 10 reps - Hip Abduction Machine  - 1 x daily - 5 x weekly - 2 sets - 10 reps - Hip Adduction Machine  - 1 x daily - 5 x weekly - 2 sets - 10 reps   PATIENT EDUCATION: Education details: edu on weight machines to address strength at [facility] with instruction for safety and encouraged her ot reach out to gym staff to assist with equipment  Person educated: Patient Education method: Explanation, Demonstration, Tactile cues, Verbal cues, and Handouts Education comprehension: verbalized understanding and returned demonstration    -------------------------------------------------------- Objective measures below taken at initial evaluation:  DIAGNOSTIC FINDINGS: MRI of head/neck scheduled  COGNITION: Overall cognitive status: Within functional limits for tasks assessed   SENSATION: Not tested, pt reports diminished sensation from knees distal  EDEMA:  Wearing compression wraps on ankles    POSTURE:  No Significant postural limitations and generally frail and cachectic appearing  Cervical ROM:    Active A/PROM (deg) eval  Flexion 50 deg  Extension 50 deg  Right lateral flexion 20 deg  Left lateral flexion 30 deg  Right rotation 45  Left rotation 30  (Blank rows = not tested)  :   LOWER EXTREMITY MMT:   BLE strength 3/5 gross as assessed in sitting  BED MOBILITY:  indep  TRANSFERS: Assistive device utilized: Single point cane and None  Sit to stand: Modified independence Stand to sit: Modified independence Chair to chair: Modified independence Floor: NT    CURB: CGA  GAIT: Gait pattern: decreased stride length, wide BOS, poor foot clearance- Right, and poor foot clearance- Left Distance walked:  Assistive device utilized: Single point cane Level of assistance: Modified independence and SBA Comments:     VESTIBULAR ASSESSMENT:  GENERAL  OBSERVATION: wears progressive lens   SYMPTOM BEHAVIOR:  Subjective history: prolonged hx of dizziness, unsteadiness, and frequent falls  Non-Vestibular symptoms: changes in hearing, neck pain, headaches, tinnitus, and right ear pain  Type of dizziness: "Funny feeling in the head"  Frequency: "all the time"  Duration: "all the time but fluctuates"  Aggravating factors: No known aggravating factors  Relieving factors: no known relieving factors  Progression of symptoms: worse  OCULOMOTOR EXAM:  Ocular Alignment: normal  Ocular ROM: No Limitations  Spontaneous Nystagmus: absent  Gaze-Induced Nystagmus: absent  Smooth Pursuits: intact  Saccades: hypometric/undershoots and slow  Convergence/Divergence: 3 cm     VESTIBULAR - OCULAR REFLEX:   Slow VOR: Comment: reports feeling heaviness in head  VOR Cancellation: Comment: normal response, moves slowly  Head-Impulse Test: HIT Right: negative HIT Left: negative  Dynamic Visual Acuity: Not able to be assessed   POSITIONAL TESTING: Right Dix-Hallpike: unable to assess due to coughing/position Left Dix-Hallpike: no nystagmus, difficult to assess due to coughing Right Roll Test: no nystagmus Left Roll Test: no nystagmus  MOTION SENSITIVITY:  Motion Sensitivity Quotient Intensity: 0 = none, 1 = Lightheaded, 2 = Mild, 3 = Moderate, 4 = Severe, 5 = Vomiting  Intensity  1. Sitting to supine   2. Supine to L side   3. Supine to R side   4. Supine to sitting   5. L Hallpike-Dix   6. Up from L    7. R Hallpike-Dix   8. Up from R    9. Sitting, head tipped to L knee   10. Head up from L knee   11. Sitting, head tipped to R knee   12. Head up from R knee   13. Sitting head turns x5   14.Sitting head nods x5   15. In stance, 180 turn to L    16. In stance, 180 turn to R     OTHOSTATICS: unable to do  FUNCTIONAL GAIT:  TBD      GOALS: Goals reviewed with patient? Yes  SHORT TERM GOALS: Target date: same as LTG   LONG  TERM GOALS: Target date: 05/25/2023    The patient will be independent with HEP for gaze adaptation, habituation, balance, and general mobility. Baseline:  Goal status: IN PROGRESS  2.  Demo mild sway condition 4 M-CTSIB x 15 sec to improve safety in standing during ADL Baseline: 3.03 sec condition 4 05/02/2023 Goal status: IN PROGRESS  3.  Demo BLE gross strength 4/5 to improve stability and activity tolerance Baseline: 3/5 Goal status: IN PROGRESS  4.  Demo improved ambulation tolerance with least restrictive AD per 250 ft distance during Baseline: 115 ft 05/02/2023 baseline Goal status: IN PROGRESS    ASSESSMENT:  CLINICAL IMPRESSION: Pt arrived to session today reporting she is taking it "day by day" as far as dizziness, but she has done well since last session.  She has more unsteadiness with lateral stepping and minisquat activities.  Able to work on strengthening with step ups in parallel bars. Pt continues to be guarded with movements, but overall does not report increase in dizziness with activities, just fatigue. OBJECTIVE IMPAIRMENTS: Abnormal gait, decreased activity tolerance, decreased balance, decreased endurance, decreased knowledge of use of DME, difficulty walking, decreased strength, and dizziness.   ACTIVITY LIMITATIONS: carrying, lifting, bending, standing, reach over head, and locomotion level  PARTICIPATION LIMITATIONS: meal prep, cleaning, driving, shopping, and community activity  PERSONAL FACTORS: Age, Time since onset of injury/illness/exacerbation, and 3+ comorbidities: PMH  are also affecting patient's functional outcome.   REHAB POTENTIAL: Fair based on time since onset, chronic nature, interaction of conditions  CLINICAL DECISION MAKING: Evolving/moderate complexity  EVALUATION COMPLEXITY: Moderate   PLAN:  PT FREQUENCY: 2x/week  PT DURATION: 4 weeks  PLANNED INTERVENTIONS: Therapeutic exercises, Therapeutic activity, Neuromuscular  re-education, Balance training, Gait training, Patient/Family education, Self Care, Joint mobilization, Stair training, Vestibular training, Canalith repositioning, DME instructions, Aquatic Therapy, Dry Needling, Spinal mobilization, Cryotherapy, Moist heat, and Manual therapy  PLAN FOR NEXT SESSION:  Check LTGs and discuss POC; Try corner/counter balance-working on habituation of EC or feet more narrow.  Head motions and VOR for HEP   Lonia Blood, PT 05/18/23 5:04 PM Phone: [phone] Fax: [phone]  [facility] at [facility] [address], [address] [place], [place] [address] Phone # [phone] Fax # [phone]

## 2023-05-23 ENCOUNTER — Ambulatory Visit: Payer: Medicare HMO

## 2023-05-23 DIAGNOSIS — R262 Difficulty in walking, not elsewhere classified: Secondary | ICD-10-CM

## 2023-05-23 DIAGNOSIS — R42 Dizziness and giddiness: Secondary | ICD-10-CM | POA: Diagnosis not present

## 2023-05-23 DIAGNOSIS — R2681 Unsteadiness on feet: Secondary | ICD-10-CM | POA: Diagnosis not present

## 2023-05-23 DIAGNOSIS — M6281 Muscle weakness (generalized): Secondary | ICD-10-CM

## 2023-05-23 NOTE — Therapy (Signed)
OUTPATIENT PHYSICAL THERAPY VESTIBULAR TREATMENT NOTE, Progress Note, and Recertification   Patient Name: [name] MRN: [id number] DOB:[birth year], 82 y.o., female Today's Date: 05/23/2023  Progress Note Reporting Period 04/27/23 to 05/23/23  See note below for Objective Data and Assessment of Progress/Goals.   END OF SESSION:  PT End of Session - 05/23/23 1103     Visit Number 7    Number of Visits 15    Date for PT Re-Evaluation 06/20/23    Authorization Type [employer] Medicare//BCBS    PT Start Time 1101    PT Stop Time 1145    PT Time Calculation (min) 44 min    Equipment Utilized During Treatment Gait belt    Activity Tolerance Patient tolerated treatment well    Behavior During Therapy Anxious;WFL for tasks assessed/performed                Past Medical History:  Diagnosis Date   Allergy    Arthritis    Atherosclerosis of aorta (HCC) 2019   ct   BOOP (bronchiolitis obliterans with organizing pneumonia) (HCC) 2003   Bronchiectasis    Cataract    Esophageal dysmotility    Esophageal ulcer    Gastroparesis    GERD (gastroesophageal reflux disease)    severe   Headache    migraines   Hiatal hernia    neuropathy right foot and leg   Iron deficiency anemia    Macular degeneration (senile) of retina    Neuropathy    Nutcracker esophagus    Osteopenia    Pneumonia    Pulmonary fibrosis (HCC)    Raynaud's disease    Recurrent pneumonia    Due to aspiration   Thrombocytosis    Past Surgical History:  Procedure Laterality Date   61 HOUR PH STUDY N/A 10/13/2015   Procedure: 24 HOUR PH STUDY;  Surgeon: Iva Boop, MD;  Location: [facility];  Service: Endoscopy;  Laterality: N/A;   BREAST LUMPECTOMY     left, x 2   CATARACT EXTRACTION     both eyes   CHOLECYSTECTOMY  2005   COLONOSCOPY     ESOPHAGEAL MANOMETRY N/A 10/06/2015   Procedure: ESOPHAGEAL MANOMETRY (EM);  Surgeon: Ruffin Frederick, MD;  Location: [facility];  Service:  Gastroenterology;  Laterality: N/A;   ESOPHAGEAL MANOMETRY N/A 03/10/2018   Procedure: ESOPHAGEAL MANOMETRY (EM);  Surgeon: Napoleon Form, MD;  Location: [facility];  Service: Endoscopy;  Laterality: N/A;   ESOPHAGEAL MANOMETRY N/A 10/15/2020   Procedure: ESOPHAGEAL MANOMETRY (EM);  Surgeon: Shellia Cleverly, DO;  Location: [facility];  Service: Gastroenterology;  Laterality: N/A;   ESOPHAGOGASTRODUODENOSCOPY (EGD) WITH PROPOFOL N/A 08/02/2022   Procedure: ESOPHAGOGASTRODUODENOSCOPY (EGD) WITH PROPOFOL;  Surgeon: Benancio Deeds, MD;  Location: [facility];  Service: Gastroenterology;  Laterality: N/A;   FOREIGN BODY REMOVAL  08/02/2022   Procedure: FOREIGN BODY REMOVAL;  Surgeon: Benancio Deeds, MD;  Location: [facility];  Service: Gastroenterology;;   LUNG BIOPSY     vats right 2003   SAVORY DILATION N/A 08/02/2022   Procedure: SAVORY DILATION;  Surgeon: Benancio Deeds, MD;  Location: [facility];  Service: Gastroenterology;  Laterality: N/A;   TOTAL ABDOMINAL HYSTERECTOMY     UPPER GASTROINTESTINAL ENDOSCOPY     Patient Active Problem List   Diagnosis Date Noted   Osteopenia of multiple sites 01/05/2023   Porokeratosis 01/05/2023   Pain due to onychomycosis of toenails of both feet 01/05/2023   Bilateral dry eyes 01/07/2022  Pain in both lower extremities 12/23/2021   Purple toe syndrome of both feet (HCC) 12/23/2021   Iron deficiency anemia 09/25/2021   Senile purpura (HCC) 09/25/2021   Syndrome of inappropriate secretion of antidiuretic hormone (HCC) 09/25/2021   Macular degeneration 03/18/2021   Choroidal nevus, left eye 01/01/2021   Early stage nonexudative age-related macular degeneration of both eyes 01/01/2021   Thrombocytosis 12/08/2020   Bronchiolectasis (HCC) 11/27/2020   Hardening of the aorta (main artery of the heart) (HCC) 11/27/2020   Idiopathic progressive polyneuropathy 11/27/2020   Intestinal malabsorption 11/27/2020   Malnutrition  of mild degree Lily Kocher: 75% to less than 90% of standard weight) (HCC) 11/27/2020   Pulmonary fibrosis (HCC) 11/27/2020   Urge incontinence of urine 11/27/2020   Protein calorie malnutrition (HCC) 11/24/2020   Left epiretinal membrane 04/01/2020   Posterior vitreous detachment of both eyes 04/01/2020   Burning sensation of feet 02/17/2020   Venous insufficiency 01/02/2019   Carpal tunnel syndrome of right wrist 11/24/2018   Sore throat 12/08/2017   Hoarseness 12/08/2017   Abnormal CT scan 12/06/2017   Laryngopharyngeal reflux (LPR) 10/03/2017   Temporomandibular jaw dysfunction 10/03/2017   Tension-type headache, not intractable 09/12/2017   Abdominal pain 04/28/2017   Other fatigue 04/28/2017   Pneumonia 12/14/2016   Neuropathy 07/30/2016   TIA (transient ischemic attack) 06/22/2016   New onset of headaches after age 87 06/22/2016   Dysphagia    Allergic reaction 01/11/2015   Arthritis of right ankle 10/09/2013   Pes cavus 10/09/2013   Loss of transverse plantar arch 10/09/2013   Allergic rhinitis 05/14/2013   BRONCHIECTASIS 12/15/2010   Nutcracker esophagus 01/26/2008   GERD 01/26/2008   GASTROPARESIS 01/26/2008    PCP: Thana Ates REFERRING PROVIDER: Van Clines, MD  REFERRING DIAG: R29.6 (ICD-10-CM) - Frequent falls R42 (ICD-10-CM) - Dizziness M54.2 (ICD-10-CM) - Neck pain  THERAPY DIAG:  Unsteadiness on feet  Dizziness and giddiness  Muscle weakness (generalized)  Difficulty in walking, not elsewhere classified  ONSET DATE: "several years"  Rationale for Evaluation and Treatment: Rehabilitation  SUBJECTIVE:   SUBJECTIVE STATEMENT: Notes that the head symptoms were suppressed for a while (the "whirly bird in the head") but have since returned.  Pt unsure as to what activities/positions provoke these symptoms.  Pt reports her symptoms are often spontaneous and can be present at rest.  Pt reports she has upcoming MRI of head upcoming this month  Pt  accompanied by: self  PERTINENT HISTORY:  The patient was last seen 3 months ago for neuropathy with frequent falls. She called for an earlier visit after a fall a week prior with more dizziness, headache, and confusion. She states she has not been feeling well. She describes a sensation of feeling lightheaded as well as spinning. Sometimes she has a headache. She feels nauseated, no vomiting. She reports 5 falls in the past year, feeling this unwell sensation since the first fall. She injured her right knee with the last fall and saw Ortho yesterday. She was bending down to pick something up and fell, right knee popped out. She states the dizziness lasts all day, not as severe at night  PAIN:  Are you having pain? Yes: NPRS scale: 0/10 Pain location: head Pain description: dizzy Aggravating factors: unsure, movement Relieving factors: staying still  PRECAUTIONS: Fall  WEIGHT BEARING RESTRICTIONS: No  FALLS: Has patient fallen in last 6 months? Yes. Number of falls 5  LIVING ENVIRONMENT: Lives with: lives alone Lives in: House/apartment Stairs: No  Has following equipment at home: Single point cane  PLOF: Independent with basic ADLs, Independent with household mobility with device, and Independent with community mobility with device  PATIENT GOALS: improve symptoms and improve balance  OBJECTIVE:   TODAY'S TREATMENT: 05/23/23 Activity Comments  Symptoms at rest (sitting) 7/10 dizzy  M-CTSIB Condition 1: normal-mild x 15 sec Condition 2: moderate x 15 sec Condition 3: mild-mod x 8 sec Condition 4: moderate x 8 sec  120 ft w/ cane  Manual muscle test  Grossly 3+/5 BLE  Pt education Discussion regarding POC details and review of STG/LTG          TODAY'S TREATMENT: 05/16/23 Activity Comments  in II bars:   -alt posterior step x 10 -Alt forward step, x 10 -Forward>back step and weightshift x 5  B UE support throughout      step ups on 6" stool 2x5 each LE B UE support;  focus on slow step backs; slightly more challenging on L LE  Side step squat x 10 reps each leg leading Verbal cueing to maintain proper sequencing of movements  Forward step ups to 6" step, 2 x 3 reps BUE support  STS 5x Pushing off armrests   Gait x 170 ft (125 ft in 2 minutes) With cane, min guard    HOME EXERCISE PROGRAM Last updated: 05/16/23 Access Code: [email or web address] URL: [email or web address] Date: 05/16/2023 Prepared by: [facility] - Outpatient  Re[facility] - [facility]  Program Notes start at the lowest weight and slowly build up from there. try to perform 2 sets of 10 at a weight that you can maintain good form with.  Exercises - Hamstring Curl with Weight Machine  - 1 x daily - 5 x weekly - 2 sets - 10 reps - Knee Extension with Weight Machine  - 1 x daily - 5 x weekly - 2 sets - 10 reps - Full Leg Press  - 1 x daily - 5 x weekly - 2 sets - 10 reps - Hip Abduction Machine  - 1 x daily - 5 x weekly - 2 sets - 10 reps - Hip Adduction Machine  - 1 x daily - 5 x weekly - 2 sets - 10 reps   PATIENT EDUCATION: Education details: edu on weight machines to address strength at [facility] with instruction for safety and encouraged her ot reach out to gym staff to assist with equipment  Person educated: Patient Education method: Explanation, Demonstration, Tactile cues, Verbal cues, and Handouts Education comprehension: verbalized understanding and returned demonstration    -------------------------------------------------------- Objective measures below taken at initial evaluation:  DIAGNOSTIC FINDINGS: MRI of head/neck scheduled  COGNITION: Overall cognitive status: Within functional limits for tasks assessed   SENSATION: Not tested, pt reports diminished sensation from knees distal  EDEMA:  Wearing compression wraps on ankles    POSTURE:  No Significant postural limitations and generally frail and cachectic appearing  Cervical ROM:    Active A/PROM  (deg) eval  Flexion 50 deg  Extension 50 deg  Right lateral flexion 20 deg  Left lateral flexion 30 deg  Right rotation 45  Left rotation 30  (Blank rows = not tested)  :   LOWER EXTREMITY MMT:   BLE strength 3/5 gross as assessed in sitting  BED MOBILITY:  indep  TRANSFERS: Assistive device utilized: Single point cane and None  Sit to stand: Modified independence Stand to sit: Modified independence Chair to chair: Modified independence Floor: NT    CURB: CGA  GAIT: Gait pattern: decreased stride length, wide BOS, poor foot clearance- Right, and poor foot clearance- Left Distance walked:  Assistive device utilized: Single point cane Level of assistance: Modified independence and SBA Comments:     VESTIBULAR ASSESSMENT:  GENERAL OBSERVATION: wears progressive lens   SYMPTOM BEHAVIOR:  Subjective history: prolonged hx of dizziness, unsteadiness, and frequent falls  Non-Vestibular symptoms: changes in hearing, neck pain, headaches, tinnitus, and right ear pain  Type of dizziness: "Funny feeling in the head"  Frequency: "all the time"  Duration: "all the time but fluctuates"  Aggravating factors: No known aggravating factors  Relieving factors: no known relieving factors  Progression of symptoms: worse  OCULOMOTOR EXAM:  Ocular Alignment: normal  Ocular ROM: No Limitations  Spontaneous Nystagmus: absent  Gaze-Induced Nystagmus: absent  Smooth Pursuits: intact  Saccades: hypometric/undershoots and slow  Convergence/Divergence: 3 cm     VESTIBULAR - OCULAR REFLEX:   Slow VOR: Comment: reports feeling heaviness in head  VOR Cancellation: Comment: normal response, moves slowly  Head-Impulse Test: HIT Right: negative HIT Left: negative  Dynamic Visual Acuity: Not able to be assessed   POSITIONAL TESTING: Right Dix-Hallpike: unable to assess due to coughing/position Left Dix-Hallpike: no nystagmus, difficult to assess due to coughing Right Roll Test: no  nystagmus Left Roll Test: no nystagmus  MOTION SENSITIVITY:  Motion Sensitivity Quotient Intensity: 0 = none, 1 = Lightheaded, 2 = Mild, 3 = Moderate, 4 = Severe, 5 = Vomiting  Intensity  1. Sitting to supine   2. Supine to L side   3. Supine to R side   4. Supine to sitting   5. L Hallpike-Dix   6. Up from L    7. R Hallpike-Dix   8. Up from R    9. Sitting, head tipped to L knee   10. Head up from L knee   11. Sitting, head tipped to R knee   12. Head up from R knee   13. Sitting head turns x5   14.Sitting head nods x5   15. In stance, 180 turn to L    16. In stance, 180 turn to R     OTHOSTATICS: unable to do  FUNCTIONAL GAIT:  TBD      GOALS: Goals reviewed with patient? Yes  SHORT TERM GOALS: Target date: same as LTG   LONG TERM GOALS: Target date: 06/20/2023      The patient will be independent with HEP for gaze adaptation, habituation, balance, and general mobility. Baseline:  Goal status: IN PROGRESS  2.  Demo mild sway condition 4 M-CTSIB x 15 sec to improve safety in standing during ADL Baseline: 3.03 sec condition 4 05/02/2023; (05/23/23) 8 sec Goal status: IN PROGRESS  3.  Demo BLE gross strength 4/5 to improve stability and activity tolerance Baseline: 3/5; (05/23/23) 3+/5 Goal status: IN PROGRESS  4.  Demo improved ambulation tolerance with least restrictive AD per 250 ft distance during Baseline: 115 ft 05/02/2023 baseline; (05/23/23) 120 ft w/ cane Goal status: IN PROGRESS    ASSESSMENT:  CLINICAL IMPRESSION: Pt reports recently that her dizziness symptoms were temporarily suppressed/minimal but have since returned.  Pt feels there may be some correlation between her pulmonary issues and weather conditions that may contribute to this.  Pt reports continued presence of dizziness symptoms that are present whether she is at rest, during activity, and seemingly independent of position as she reports a similar feeling is experienced at rest in  supine, sitting, or standing  and that it has a spontaneous nature.  Review of STG/LTG today reveals some improvement in BLE strength and improved tolerance/stability to balance demands of M-CTSIB from her evaluation baseline.  Discussed dizziness as a symptom likely of multi-factorial origin.  Patient would benefit from continued sessions to provide safe and effective venue for continued training in LE strength, balance, and reducing risk for falls whilst monitoring for adverse responses.  OBJECTIVE IMPAIRMENTS: Abnormal gait, decreased activity tolerance, decreased balance, decreased endurance, decreased knowledge of use of DME, difficulty walking, decreased strength, and dizziness.   ACTIVITY LIMITATIONS: carrying, lifting, bending, standing, reach over head, and locomotion level  PARTICIPATION LIMITATIONS: meal prep, cleaning, driving, shopping, and community activity  PERSONAL FACTORS: Age, Time since onset of injury/illness/exacerbation, and 3+ comorbidities: PMH  are also affecting patient's functional outcome.   REHAB POTENTIAL: Fair based on time since onset, chronic nature, interaction of conditions  CLINICAL DECISION MAKING: Evolving/moderate complexity  EVALUATION COMPLEXITY: Moderate   PLAN:  PT FREQUENCY: 2x/week  PT DURATION: 4 weeks  PLANNED INTERVENTIONS: Therapeutic exercises, Therapeutic activity, Neuromuscular re-education, Balance training, Gait training, Patient/Family education, Self Care, Joint mobilization, Stair training, Vestibular training, Canalith repositioning, DME instructions, Aquatic Therapy, Dry Needling, Spinal mobilization, Cryotherapy, Moist heat, and Manual therapy  PLAN FOR NEXT SESSION: ; Try corner/counter balance-working on habituation of EC or feet more narrow.  Head motions and VOR for HEP   12:46 PM, 05/23/23 M. Shary Decamp, PT, DPT Physical Therapist- [place] Office Number: [phone]  Fax # [phone]

## 2023-05-26 ENCOUNTER — Ambulatory Visit: Payer: Medicare HMO | Admitting: Physical Therapy

## 2023-05-30 ENCOUNTER — Ambulatory Visit: Payer: Medicare HMO

## 2023-05-30 DIAGNOSIS — M6281 Muscle weakness (generalized): Secondary | ICD-10-CM

## 2023-05-30 DIAGNOSIS — R42 Dizziness and giddiness: Secondary | ICD-10-CM | POA: Diagnosis not present

## 2023-05-30 DIAGNOSIS — R262 Difficulty in walking, not elsewhere classified: Secondary | ICD-10-CM

## 2023-05-30 DIAGNOSIS — R2681 Unsteadiness on feet: Secondary | ICD-10-CM | POA: Diagnosis not present

## 2023-05-30 NOTE — Therapy (Signed)
OUTPATIENT PHYSICAL THERAPY VESTIBULAR TREATMENT NOTE   Patient Name: [name] MRN: [id number] DOB:[birth year], 82 y.o., female Today's Date: 05/30/2023    END OF SESSION:  PT End of Session - 05/30/23 1535     Visit Number 8    Number of Visits 15    Date for PT Re-Evaluation 06/20/23    Authorization Type [employer] Medicare//BCBS    PT Start Time 1532    PT Stop Time 1615    PT Time Calculation (min) 43 min    Equipment Utilized During Treatment Gait belt    Activity Tolerance Patient tolerated treatment well    Behavior During Therapy Anxious;WFL for tasks assessed/performed                Past Medical History:  Diagnosis Date   Allergy    Arthritis    Atherosclerosis of aorta (HCC) 2019   ct   BOOP (bronchiolitis obliterans with organizing pneumonia) (HCC) 2003   Bronchiectasis    Cataract    Esophageal dysmotility    Esophageal ulcer    Gastroparesis    GERD (gastroesophageal reflux disease)    severe   Headache    migraines   Hiatal hernia    neuropathy right foot and leg   Iron deficiency anemia    Macular degeneration (senile) of retina    Neuropathy    Nutcracker esophagus    Osteopenia    Pneumonia    Pulmonary fibrosis (HCC)    Raynaud's disease    Recurrent pneumonia    Due to aspiration   Thrombocytosis    Past Surgical History:  Procedure Laterality Date   37 HOUR PH STUDY N/A 10/13/2015   Procedure: 24 HOUR PH STUDY;  Surgeon: Iva Boop, MD;  Location: [facility];  Service: Endoscopy;  Laterality: N/A;   BREAST LUMPECTOMY     left, x 2   CATARACT EXTRACTION     both eyes   CHOLECYSTECTOMY  2005   COLONOSCOPY     ESOPHAGEAL MANOMETRY N/A 10/06/2015   Procedure: ESOPHAGEAL MANOMETRY (EM);  Surgeon: Ruffin Frederick, MD;  Location: [facility];  Service: Gastroenterology;  Laterality: N/A;   ESOPHAGEAL MANOMETRY N/A 03/10/2018   Procedure: ESOPHAGEAL MANOMETRY (EM);  Surgeon: Napoleon Form, MD;  Location: [facility];  Service: Endoscopy;  Laterality: N/A;   ESOPHAGEAL MANOMETRY N/A 10/15/2020   Procedure: ESOPHAGEAL MANOMETRY (EM);  Surgeon: Shellia Cleverly, DO;  Location: [facility];  Service: Gastroenterology;  Laterality: N/A;   ESOPHAGOGASTRODUODENOSCOPY (EGD) WITH PROPOFOL N/A 08/02/2022   Procedure: ESOPHAGOGASTRODUODENOSCOPY (EGD) WITH PROPOFOL;  Surgeon: Benancio Deeds, MD;  Location: [facility];  Service: Gastroenterology;  Laterality: N/A;   FOREIGN BODY REMOVAL  08/02/2022   Procedure: FOREIGN BODY REMOVAL;  Surgeon: Benancio Deeds, MD;  Location: [facility];  Service: Gastroenterology;;   LUNG BIOPSY     vats right 2003   SAVORY DILATION N/A 08/02/2022   Procedure: SAVORY DILATION;  Surgeon: Benancio Deeds, MD;  Location: [facility];  Service: Gastroenterology;  Laterality: N/A;   TOTAL ABDOMINAL HYSTERECTOMY     UPPER GASTROINTESTINAL ENDOSCOPY     Patient Active Problem List   Diagnosis Date Noted   Osteopenia of multiple sites 01/05/2023   Porokeratosis 01/05/2023   Pain due to onychomycosis of toenails of both feet 01/05/2023   Bilateral dry eyes 01/07/2022   Pain in both lower extremities 12/23/2021   Purple toe syndrome of both feet (HCC) 12/23/2021   Iron deficiency anemia  09/25/2021   Senile purpura (HCC) 09/25/2021   Syndrome of inappropriate secretion of antidiuretic hormone (HCC) 09/25/2021   Macular degeneration 03/18/2021   Choroidal nevus, left eye 01/01/2021   Early stage nonexudative age-related macular degeneration of both eyes 01/01/2021   Thrombocytosis 12/08/2020   Bronchiolectasis (HCC) 11/27/2020   Hardening of the aorta (main artery of the heart) (HCC) 11/27/2020   Idiopathic progressive polyneuropathy 11/27/2020   Intestinal malabsorption 11/27/2020   Malnutrition of mild degree Lily Kocher: 75% to less than 90% of standard weight) (HCC) 11/27/2020   Pulmonary fibrosis (HCC) 11/27/2020   Urge incontinence of urine 11/27/2020    Protein calorie malnutrition (HCC) 11/24/2020   Left epiretinal membrane 04/01/2020   Posterior vitreous detachment of both eyes 04/01/2020   Burning sensation of feet 02/17/2020   Venous insufficiency 01/02/2019   Carpal tunnel syndrome of right wrist 11/24/2018   Sore throat 12/08/2017   Hoarseness 12/08/2017   Abnormal CT scan 12/06/2017   Laryngopharyngeal reflux (LPR) 10/03/2017   Temporomandibular jaw dysfunction 10/03/2017   Tension-type headache, not intractable 09/12/2017   Abdominal pain 04/28/2017   Other fatigue 04/28/2017   Pneumonia 12/14/2016   Neuropathy 07/30/2016   TIA (transient ischemic attack) 06/22/2016   New onset of headaches after age 61 06/22/2016   Dysphagia    Allergic reaction 01/11/2015   Arthritis of right ankle 10/09/2013   Pes cavus 10/09/2013   Loss of transverse plantar arch 10/09/2013   Allergic rhinitis 05/14/2013   BRONCHIECTASIS 12/15/2010   Nutcracker esophagus 01/26/2008   GERD 01/26/2008   GASTROPARESIS 01/26/2008    PCP: Thana Ates REFERRING PROVIDER: Van Clines, MD  REFERRING DIAG: R29.6 (ICD-10-CM) - Frequent falls R42 (ICD-10-CM) - Dizziness M54.2 (ICD-10-CM) - Neck pain  THERAPY DIAG:  Unsteadiness on feet  Dizziness and giddiness  Muscle weakness (generalized)  Difficulty in walking, not elsewhere classified  ONSET DATE: "several years"  Rationale for Evaluation and Treatment: Rehabilitation  SUBJECTIVE:   SUBJECTIVE STATEMENT: Nothing new to report.  The heat causes breathing difficulties but feeling ok.  Pt accompanied by: self  PERTINENT HISTORY:  The patient was last seen 3 months ago for neuropathy with frequent falls. She called for an earlier visit after a fall a week prior with more dizziness, headache, and confusion. She states she has not been feeling well. She describes a sensation of feeling lightheaded as well as spinning. Sometimes she has a headache. She feels nauseated, no vomiting. She  reports 5 falls in the past year, feeling this unwell sensation since the first fall. She injured her right knee with the last fall and saw Ortho yesterday. She was bending down to pick something up and fell, right knee popped out. She states the dizziness lasts all day, not as severe at night  PAIN:  Are you having pain? Yes: NPRS scale: 0/10 Pain location: head Pain description: dizzy Aggravating factors: unsure, movement Relieving factors: staying still  PRECAUTIONS: Fall  WEIGHT BEARING RESTRICTIONS: No  FALLS: Has patient fallen in last 6 months? Yes. Number of falls 5  LIVING ENVIRONMENT: Lives with: lives alone Lives in: House/apartment Stairs: No Has following equipment at home: Single point cane  PLOF: Independent with basic ADLs, Independent with household mobility with device, and Independent with community mobility with device  PATIENT GOALS: improve symptoms and improve balance  OBJECTIVE:   TODAY'S TREATMENT: 05/30/23 Activity Comments  Symptoms at rest (sitting) 8/10  Oculomotor glasses on/off -no gaze evoked nystagmus -saccades: slow, hypometric -smooth pursuits: smooth (  Glasses on) -same tested glasses off, note age-appropriate nystagmus at end-range   Standing balance/habituation -feet together Eo/EC 3x15 sec -head turns 3x EO/EC  Seated VOR 2x10 reps -near target/far target--                HOME EXERCISE PROGRAM Last updated: 05/16/23 Access Code: [email or web address] URL: [email or web address] Date: 05/16/2023 Prepared by: [facility] - Outpatient  Rehab - [facility]  Program Notes start at the lowest weight and slowly build up from there. try to perform 2 sets of 10 at a weight that you can maintain good form with.  Exercises - Hamstring Curl with Weight Machine  - 1 x daily - 5 x weekly - 2 sets - 10 reps - Knee Extension with Weight Machine  - 1 x daily - 5 x weekly - 2 sets - 10 reps - Full Leg Press  - 1 x daily - 5 x weekly - 2  sets - 10 reps - Hip Abduction Machine  - 1 x daily - 5 x weekly - 2 sets - 10 reps - Hip Adduction Machine  - 1 x daily - 5 x weekly - 2 sets - 10 reps - Seated Gaze Stabilization with Head Rotation  - 1 x daily - 7 x weekly - 3 sets - 10 reps  PATIENT EDUCATION: Education details: edu on weight machines to address strength at [facility] with instruction for safety and encouraged her ot reach out to gym staff to assist with equipment  Person educated: Patient Education method: Explanation, Demonstration, Tactile cues, Verbal cues, and Handouts Education comprehension: verbalized understanding and returned demonstration    -------------------------------------------------------- Objective measures below taken at initial evaluation:  DIAGNOSTIC FINDINGS: MRI of head/neck scheduled  COGNITION: Overall cognitive status: Within functional limits for tasks assessed   SENSATION: Not tested, pt reports diminished sensation from knees distal  EDEMA:  Wearing compression wraps on ankles    POSTURE:  No Significant postural limitations and generally frail and cachectic appearing  Cervical ROM:    Active A/PROM (deg) eval  Flexion 50 deg  Extension 50 deg  Right lateral flexion 20 deg  Left lateral flexion 30 deg  Right rotation 45  Left rotation 30  (Blank rows = not tested)  :   LOWER EXTREMITY MMT:   BLE strength 3/5 gross as assessed in sitting  BED MOBILITY:  indep  TRANSFERS: Assistive device utilized: Single point cane and None  Sit to stand: Modified independence Stand to sit: Modified independence Chair to chair: Modified independence Floor: NT    CURB: CGA  GAIT: Gait pattern: decreased stride length, wide BOS, poor foot clearance- Right, and poor foot clearance- Left Distance walked:  Assistive device utilized: Single point cane Level of assistance: Modified independence and SBA Comments:     VESTIBULAR ASSESSMENT:  GENERAL OBSERVATION: wears  progressive lens   SYMPTOM BEHAVIOR:  Subjective history: prolonged hx of dizziness, unsteadiness, and frequent falls  Non-Vestibular symptoms: changes in hearing, neck pain, headaches, tinnitus, and right ear pain  Type of dizziness: "Funny feeling in the head"  Frequency: "all the time"  Duration: "all the time but fluctuates"  Aggravating factors: No known aggravating factors  Relieving factors: no known relieving factors  Progression of symptoms: worse  OCULOMOTOR EXAM:  Ocular Alignment: normal  Ocular ROM: No Limitations  Spontaneous Nystagmus: absent  Gaze-Induced Nystagmus: absent  Smooth Pursuits: intact  Saccades: hypometric/undershoots and slow  Convergence/Divergence: 3 cm     VESTIBULAR - OCULAR REFLEX:  Slow VOR: Comment: reports feeling heaviness in head  VOR Cancellation: Comment: normal response, moves slowly  Head-Impulse Test: HIT Right: negative HIT Left: negative  Dynamic Visual Acuity: Not able to be assessed   POSITIONAL TESTING: Right Dix-Hallpike: unable to assess due to coughing/position Left Dix-Hallpike: no nystagmus, difficult to assess due to coughing Right Roll Test: no nystagmus Left Roll Test: no nystagmus  MOTION SENSITIVITY:  Motion Sensitivity Quotient Intensity: 0 = none, 1 = Lightheaded, 2 = Mild, 3 = Moderate, 4 = Severe, 5 = Vomiting  Intensity  1. Sitting to supine   2. Supine to L side   3. Supine to R side   4. Supine to sitting   5. L Hallpike-Dix   6. Up from L    7. R Hallpike-Dix   8. Up from R    9. Sitting, head tipped to L knee   10. Head up from L knee   11. Sitting, head tipped to R knee   12. Head up from R knee   13. Sitting head turns x5   14.Sitting head nods x5   15. In stance, 180 turn to L    16. In stance, 180 turn to R     OTHOSTATICS: unable to do  FUNCTIONAL GAIT:  TBD      GOALS: Goals reviewed with patient? Yes  SHORT TERM GOALS: Target date: same as LTG   LONG TERM GOALS: Target  date: 06/20/2023      The patient will be independent with HEP for gaze adaptation, habituation, balance, and general mobility. Baseline:  Goal status: IN PROGRESS  2.  Demo mild sway condition 4 M-CTSIB x 15 sec to improve safety in standing during ADL Baseline: 3.03 sec condition 4 05/02/2023; (05/23/23) 8 sec Goal status: IN PROGRESS  3.  Demo BLE gross strength 4/5 to improve stability and activity tolerance Baseline: 3/5; (05/23/23) 3+/5 Goal status: IN PROGRESS  4.  Demo improved ambulation tolerance with least restrictive AD per 250 ft distance during Baseline: 115 ft 05/02/2023 baseline; (05/23/23) 120 ft w/ cane Goal status: IN PROGRESS    ASSESSMENT:  CLINICAL IMPRESSION: Initiated with oculomotor assessment with glasses on/off, no obvious abnormalities appreciated other than age-appropriate end-range nystagmus with gaze and slow/hypometric saccades.  Pt reports feeling symptomatic with vertical gaze vs horizontal.  VOR is slow and guarded due to increase in dizziness symptoms.  Continued with program development for multi-sensory balance challenges with inability to maintain eyes closed unless UE support afforded.  Addition of VOR activity for near/far target for HEP. Continued sessions to advance POC details OBJECTIVE IMPAIRMENTS: Abnormal gait, decreased activity tolerance, decreased balance, decreased endurance, decreased knowledge of use of DME, difficulty walking, decreased strength, and dizziness.   ACTIVITY LIMITATIONS: carrying, lifting, bending, standing, reach over head, and locomotion level  PARTICIPATION LIMITATIONS: meal prep, cleaning, driving, shopping, and community activity  PERSONAL FACTORS: Age, Time since onset of injury/illness/exacerbation, and 3+ comorbidities: PMH  are also affecting patient's functional outcome.   REHAB POTENTIAL: Fair based on time since onset, chronic nature, interaction of conditions  CLINICAL DECISION MAKING: Evolving/moderate  complexity  EVALUATION COMPLEXITY: Moderate   PLAN:  PT FREQUENCY: 2x/week  PT DURATION: 4 weeks  PLANNED INTERVENTIONS: Therapeutic exercises, Therapeutic activity, Neuromuscular re-education, Balance training, Gait training, Patient/Family education, Self Care, Joint mobilization, Stair training, Vestibular training, Canalith repositioning, DME instructions, Aquatic Therapy, Dry Needling, Spinal mobilization, Cryotherapy, Moist heat, and Manual therapy  PLAN FOR NEXT SESSION: ; Try corner/counter  balance-working on habituation of EC or feet more narrow.  Head motions and VOR for HEP   3:35 PM, 05/30/23 M. Shary Decamp, PT, DPT Physical Therapist- [place] Office Number: [phone]  Fax # [phone]

## 2023-05-31 ENCOUNTER — Encounter: Payer: Self-pay | Admitting: Neurology

## 2023-06-02 ENCOUNTER — Other Ambulatory Visit: Payer: Medicare HMO

## 2023-06-02 ENCOUNTER — Ambulatory Visit
Admission: RE | Admit: 2023-06-02 | Discharge: 2023-06-02 | Disposition: A | Discharge status: Home / Self Care | Payer: Medicare HMO | Source: Ambulatory Visit | Attending: Neurology | Admitting: Neurology

## 2023-06-02 DIAGNOSIS — R42 Dizziness and giddiness: Secondary | ICD-10-CM | POA: Diagnosis not present

## 2023-06-02 DIAGNOSIS — R296 Repeated falls: Secondary | ICD-10-CM

## 2023-06-02 DIAGNOSIS — M47812 Spondylosis without myelopathy or radiculopathy, cervical region: Secondary | ICD-10-CM | POA: Diagnosis not present

## 2023-06-02 DIAGNOSIS — M542 Cervicalgia: Secondary | ICD-10-CM

## 2023-06-02 DIAGNOSIS — M4802 Spinal stenosis, cervical region: Secondary | ICD-10-CM | POA: Diagnosis not present

## 2023-06-02 MED ORDER — GADOPICLENOL 0.5 MMOL/ML IV SOLN
4.0000 mL | Freq: Once | INTRAVENOUS | Status: AC | PRN
  Administered 2023-06-02: 4 mL via INTRAVENOUS

## 2023-06-03 ENCOUNTER — Ambulatory Visit: Payer: Medicare HMO

## 2023-06-03 DIAGNOSIS — M6281 Muscle weakness (generalized): Secondary | ICD-10-CM

## 2023-06-03 DIAGNOSIS — R2681 Unsteadiness on feet: Secondary | ICD-10-CM

## 2023-06-03 DIAGNOSIS — R262 Difficulty in walking, not elsewhere classified: Secondary | ICD-10-CM

## 2023-06-03 DIAGNOSIS — R42 Dizziness and giddiness: Secondary | ICD-10-CM

## 2023-06-03 NOTE — Therapy (Signed)
[facility] [address] [place], [place], [address] Phone: [phone]   Fax:  [phone]  Patient Details  Name: [name] MRN: [id number] Date of Birth: 29-Jul-1941 Referring Provider:  Thana Ates, MD  Encounter Date: 06/03/2023  Arrived for appointment and reports feeling unwell from MRI appointment yesterday and requests hold tx for today.  Will resume POC at next appointment.  Arrived, no charge visit. Dion Body, PT 06/03/2023, 10:37 AM  [place] [address], [address] [place], [place], [address] Phone: [phone]   Fax:  [phone]

## 2023-06-06 ENCOUNTER — Ambulatory Visit: Payer: Medicare HMO | Admitting: Physical Therapy

## 2023-06-08 ENCOUNTER — Ambulatory Visit: Payer: Medicare HMO | Admitting: Physical Therapy

## 2023-06-08 ENCOUNTER — Encounter: Payer: Self-pay | Admitting: Physical Therapy

## 2023-06-08 DIAGNOSIS — M6281 Muscle weakness (generalized): Secondary | ICD-10-CM | POA: Diagnosis not present

## 2023-06-08 DIAGNOSIS — R2681 Unsteadiness on feet: Secondary | ICD-10-CM | POA: Diagnosis not present

## 2023-06-08 DIAGNOSIS — R42 Dizziness and giddiness: Secondary | ICD-10-CM

## 2023-06-08 DIAGNOSIS — R262 Difficulty in walking, not elsewhere classified: Secondary | ICD-10-CM | POA: Diagnosis not present

## 2023-06-08 NOTE — Therapy (Signed)
OUTPATIENT PHYSICAL THERAPY VESTIBULAR TREATMENT NOTE   Patient Name: [name] MRN: [id number] DOB:Oct 08, 1941, 82 y.o., female Today's Date: 06/08/2023    END OF SESSION:  PT End of Session - 06/08/23 1455     Visit Number 9    Number of Visits 15    Date for PT Re-Evaluation 06/20/23    Authorization Type [employer] Medicare//BCBS    PT Start Time 1453    PT Stop Time 1520   pt requests to leave early to meet daughter   PT Time Calculation (min) 27 min    Equipment Utilized During Treatment Gait belt    Activity Tolerance Patient tolerated treatment well    Behavior During Therapy WFL for tasks assessed/performed                 Past Medical History:  Diagnosis Date   Allergy    Arthritis    Atherosclerosis of aorta (HCC) 2019   ct   BOOP (bronchiolitis obliterans with organizing pneumonia) (HCC) 2003   Bronchiectasis    Cataract    Esophageal dysmotility    Esophageal ulcer    Gastroparesis    GERD (gastroesophageal reflux disease)    severe   Headache    migraines   Hiatal hernia    neuropathy right foot and leg   Iron deficiency anemia    Macular degeneration (senile) of retina    Neuropathy    Nutcracker esophagus    Osteopenia    Pneumonia    Pulmonary fibrosis (HCC)    Raynaud's disease    Recurrent pneumonia    Due to aspiration   Thrombocytosis    Past Surgical History:  Procedure Laterality Date   70 HOUR PH STUDY N/A 10/13/2015   Procedure: 24 HOUR PH STUDY;  Surgeon: Iva Boop, MD;  Location: [facility];  Service: Endoscopy;  Laterality: N/A;   BREAST LUMPECTOMY     left, x 2   CATARACT EXTRACTION     both eyes   CHOLECYSTECTOMY  2005   COLONOSCOPY     ESOPHAGEAL MANOMETRY N/A 10/06/2015   Procedure: ESOPHAGEAL MANOMETRY (EM);  Surgeon: Ruffin Frederick, MD;  Location: [facility];  Service: Gastroenterology;  Laterality: N/A;   ESOPHAGEAL MANOMETRY N/A 03/10/2018   Procedure: ESOPHAGEAL MANOMETRY (EM);  Surgeon:  Napoleon Form, MD;  Location: [facility];  Service: Endoscopy;  Laterality: N/A;   ESOPHAGEAL MANOMETRY N/A 10/15/2020   Procedure: ESOPHAGEAL MANOMETRY (EM);  Surgeon: Shellia Cleverly, DO;  Location: [facility];  Service: Gastroenterology;  Laterality: N/A;   ESOPHAGOGASTRODUODENOSCOPY (EGD) WITH PROPOFOL N/A 08/02/2022   Procedure: ESOPHAGOGASTRODUODENOSCOPY (EGD) WITH PROPOFOL;  Surgeon: Benancio Deeds, MD;  Location: [facility];  Service: Gastroenterology;  Laterality: N/A;   FOREIGN BODY REMOVAL  08/02/2022   Procedure: FOREIGN BODY REMOVAL;  Surgeon: Benancio Deeds, MD;  Location: [facility];  Service: Gastroenterology;;   LUNG BIOPSY     vats right 2003   SAVORY DILATION N/A 08/02/2022   Procedure: SAVORY DILATION;  Surgeon: Benancio Deeds, MD;  Location: [facility];  Service: Gastroenterology;  Laterality: N/A;   TOTAL ABDOMINAL HYSTERECTOMY     UPPER GASTROINTESTINAL ENDOSCOPY     Patient Active Problem List   Diagnosis Date Noted   Osteopenia of multiple sites 01/05/2023   Porokeratosis 01/05/2023   Pain due to onychomycosis of toenails of both feet 01/05/2023   Bilateral dry eyes 01/07/2022   Pain in both lower extremities 12/23/2021   Purple toe syndrome  of both feet (HCC) 12/23/2021   Iron deficiency anemia 09/25/2021   Senile purpura (HCC) 09/25/2021   Syndrome of inappropriate secretion of antidiuretic hormone (HCC) 09/25/2021   Macular degeneration 03/18/2021   Choroidal nevus, left eye 01/01/2021   Early stage nonexudative age-related macular degeneration of both eyes 01/01/2021   Thrombocytosis 12/08/2020   Bronchiolectasis (HCC) 11/27/2020   Hardening of the aorta (main artery of the heart) (HCC) 11/27/2020   Idiopathic progressive polyneuropathy 11/27/2020   Intestinal malabsorption 11/27/2020   Malnutrition of mild degree Lily Kocher: 75% to less than 90% of standard weight) (HCC) 11/27/2020   Pulmonary fibrosis (HCC) 11/27/2020    Urge incontinence of urine 11/27/2020   Protein calorie malnutrition (HCC) 11/24/2020   Left epiretinal membrane 04/01/2020   Posterior vitreous detachment of both eyes 04/01/2020   Burning sensation of feet 02/17/2020   Venous insufficiency 01/02/2019   Carpal tunnel syndrome of right wrist 11/24/2018   Sore throat 12/08/2017   Hoarseness 12/08/2017   Abnormal CT scan 12/06/2017   Laryngopharyngeal reflux (LPR) 10/03/2017   Temporomandibular jaw dysfunction 10/03/2017   Tension-type headache, not intractable 09/12/2017   Abdominal pain 04/28/2017   Other fatigue 04/28/2017   Pneumonia 12/14/2016   Neuropathy 07/30/2016   TIA (transient ischemic attack) 06/22/2016   New onset of headaches after age 49 06/22/2016   Dysphagia    Allergic reaction 01/11/2015   Arthritis of right ankle 10/09/2013   Pes cavus 10/09/2013   Loss of transverse plantar arch 10/09/2013   Allergic rhinitis 05/14/2013   BRONCHIECTASIS 12/15/2010   Nutcracker esophagus 01/26/2008   GERD 01/26/2008   GASTROPARESIS 01/26/2008    PCP: Thana Ates REFERRING PROVIDER: Van Clines, MD  REFERRING DIAG: R29.6 (ICD-10-CM) - Frequent falls R42 (ICD-10-CM) - Dizziness M54.2 (ICD-10-CM) - Neck pain  THERAPY DIAG:  Unsteadiness on feet  Dizziness and giddiness  Muscle weakness (generalized)  ONSET DATE: "several years"  Rationale for Evaluation and Treatment: Rehabilitation  SUBJECTIVE:   SUBJECTIVE STATEMENT: Had some plumbing problems at home; I'm feeling a little more dizzy than I have been.    Pt accompanied by: self  PERTINENT HISTORY:  The patient was last seen 3 months ago for neuropathy with frequent falls. She called for an earlier visit after a fall a week prior with more dizziness, headache, and confusion. She states she has not been feeling well. She describes a sensation of feeling lightheaded as well as spinning. Sometimes she has a headache. She feels nauseated, no vomiting. She  reports 5 falls in the past year, feeling this unwell sensation since the first fall. She injured her right knee with the last fall and saw Ortho yesterday. She was bending down to pick something up and fell, right knee popped out. She states the dizziness lasts all day, not as severe at night  PAIN:  Are you having pain? Yes: NPRS scale: 0/10 Pain location: head Pain description: dizzy Aggravating factors: unsure, movement Relieving factors: staying still  PRECAUTIONS: Fall  WEIGHT BEARING RESTRICTIONS: No  FALLS: Has patient fallen in last 6 months? Yes. Number of falls 5  LIVING ENVIRONMENT: Lives with: lives alone Lives in: House/apartment Stairs: No Has following equipment at home: Single point cane  PLOF: Independent with basic ADLs, Independent with household mobility with device, and Independent with community mobility with device  PATIENT GOALS: improve symptoms and improve balance  OBJECTIVE:    TODAY'S TREATMENT: 06/08/2023 Activity Comments  Sidestepping x 2 min in parallel bars   Forward/back  walk x 2 min 1 UE support  Marching in place 2 x 1 minute BUE support  Sidestep/together 2 x 10 reps   Standing balance/habituation -feet together E0/EC 3 x 30 sec -head turns 5x EO/EC (increased sway EC)          HOME EXERCISE PROGRAM Last updated: 05/16/23 Access Code: [email or web address] URL: [email or web address] Date: 05/16/2023 Prepared by: [facility] - Outpatient  Rehab - [facility]  Program Notes start at the lowest weight and slowly build up from there. try to perform 2 sets of 10 at a weight that you can maintain good form with.  Exercises - Hamstring Curl with Weight Machine  - 1 x daily - 5 x weekly - 2 sets - 10 reps - Knee Extension with Weight Machine  - 1 x daily - 5 x weekly - 2 sets - 10 reps - Full Leg Press  - 1 x daily - 5 x weekly - 2 sets - 10 reps - Hip Abduction Machine  - 1 x daily - 5 x weekly - 2 sets - 10 reps - Hip Adduction  Machine  - 1 x daily - 5 x weekly - 2 sets - 10 reps - Seated Gaze Stabilization with Head Rotation  - 1 x daily - 7 x weekly - 3 sets - 10 reps  PATIENT EDUCATION: Education details: recommended to continue current HEP Person educated: Patient Education method: Explanation, Demonstration, Tactile cues, Verbal cues, and Handouts Education comprehension: verbalized understanding and returned demonstration    -------------------------------------------------------- Objective measures below taken at initial evaluation:  DIAGNOSTIC FINDINGS: MRI of head/neck scheduled  COGNITION: Overall cognitive status: Within functional limits for tasks assessed   SENSATION: Not tested, pt reports diminished sensation from knees distal  EDEMA:  Wearing compression wraps on ankles    POSTURE:  No Significant postural limitations and generally frail and cachectic appearing  Cervical ROM:    Active A/PROM (deg) eval  Flexion 50 deg  Extension 50 deg  Right lateral flexion 20 deg  Left lateral flexion 30 deg  Right rotation 45  Left rotation 30  (Blank rows = not tested)  :   LOWER EXTREMITY MMT:   BLE strength 3/5 gross as assessed in sitting  BED MOBILITY:  indep  TRANSFERS: Assistive device utilized: Single point cane and None  Sit to stand: Modified independence Stand to sit: Modified independence Chair to chair: Modified independence Floor: NT    CURB: CGA  GAIT: Gait pattern: decreased stride length, wide BOS, poor foot clearance- Right, and poor foot clearance- Left Distance walked:  Assistive device utilized: Single point cane Level of assistance: Modified independence and SBA Comments:     VESTIBULAR ASSESSMENT:  GENERAL OBSERVATION: wears progressive lens   SYMPTOM BEHAVIOR:  Subjective history: prolonged hx of dizziness, unsteadiness, and frequent falls  Non-Vestibular symptoms: changes in hearing, neck pain, headaches, tinnitus, and right ear  pain  Type of dizziness: "Funny feeling in the head"  Frequency: "all the time"  Duration: "all the time but fluctuates"  Aggravating factors: No known aggravating factors  Relieving factors: no known relieving factors  Progression of symptoms: worse  OCULOMOTOR EXAM:  Ocular Alignment: normal  Ocular ROM: No Limitations  Spontaneous Nystagmus: absent  Gaze-Induced Nystagmus: absent  Smooth Pursuits: intact  Saccades: hypometric/undershoots and slow  Convergence/Divergence: 3 cm     VESTIBULAR - OCULAR REFLEX:   Slow VOR: Comment: reports feeling heaviness in head  VOR Cancellation: Comment: normal response, moves slowly  Head-Impulse  Test: HIT Right: negative HIT Left: negative  Dynamic Visual Acuity: Not able to be assessed   POSITIONAL TESTING: Right Dix-Hallpike: unable to assess due to coughing/position Left Dix-Hallpike: no nystagmus, difficult to assess due to coughing Right Roll Test: no nystagmus Left Roll Test: no nystagmus  MOTION SENSITIVITY:  Motion Sensitivity Quotient Intensity: 0 = none, 1 = Lightheaded, 2 = Mild, 3 = Moderate, 4 = Severe, 5 = Vomiting  Intensity  1. Sitting to supine   2. Supine to L side   3. Supine to R side   4. Supine to sitting   5. L Hallpike-Dix   6. Up from L    7. R Hallpike-Dix   8. Up from R    9. Sitting, head tipped to L knee   10. Head up from L knee   11. Sitting, head tipped to R knee   12. Head up from R knee   13. Sitting head turns x5   14.Sitting head nods x5   15. In stance, 180 turn to L    16. In stance, 180 turn to R     OTHOSTATICS: unable to do  FUNCTIONAL GAIT:  TBD      GOALS: Goals reviewed with patient? Yes  SHORT TERM GOALS: Target date: same as LTG   LONG TERM GOALS: Target date: 06/20/2023      The patient will be independent with HEP for gaze adaptation, habituation, balance, and general mobility. Baseline:  Goal status: IN PROGRESS  2.  Demo mild sway condition 4 M-CTSIB x 15  sec to improve safety in standing during ADL Baseline: 3.03 sec condition 4 05/02/2023; (05/23/23) 8 sec Goal status: IN PROGRESS  3.  Demo BLE gross strength 4/5 to improve stability and activity tolerance Baseline: 3/5; (05/23/23) 3+/5 Goal status: IN PROGRESS  4.  Demo improved ambulation tolerance with least restrictive AD per 250 ft distance during Baseline: 115 ft 05/02/2023 baseline; (05/23/23) 120 ft w/ cane Goal status: IN PROGRESS    ASSESSMENT:  CLINICAL IMPRESSION: Worked on dynamic balance activities in PT session today, progressing to working on narrow BOS with head motions and with EC.  Pt reports feeling the "whirly-bird" feeling with EC and no support, but she is able to hold this position for 30 sec with minimal sway.  She had to leave early today to meet family member due to recent plumbing issues at home.  She will continue to benefit from skilled PT towards goals for improved functional mobility and progression of balance exercises. OBJECTIVE IMPAIRMENTS: Abnormal gait, decreased activity tolerance, decreased balance, decreased endurance, decreased knowledge of use of DME, difficulty walking, decreased strength, and dizziness.   ACTIVITY LIMITATIONS: carrying, lifting, bending, standing, reach over head, and locomotion level  PARTICIPATION LIMITATIONS: meal prep, cleaning, driving, shopping, and community activity  PERSONAL FACTORS: Age, Time since onset of injury/illness/exacerbation, and 3+ comorbidities: PMH  are also affecting patient's functional outcome.   REHAB POTENTIAL: Fair based on time since onset, chronic nature, interaction of conditions  CLINICAL DECISION MAKING: Evolving/moderate complexity  EVALUATION COMPLEXITY: Moderate   PLAN:  PT FREQUENCY: 2x/week  PT DURATION: 4 weeks  PLANNED INTERVENTIONS: Therapeutic exercises, Therapeutic activity, Neuromuscular re-education, Balance training, Gait training, Patient/Family education, Self Care, Joint  mobilization, Stair training, Vestibular training, Canalith repositioning, DME instructions, Aquatic Therapy, Dry Needling, Spinal mobilization, Cryotherapy, Moist heat, and Manual therapy  PLAN FOR NEXT SESSION: 10th Visit PROGRESS NOTE Continue to progress corner/counter balance-working on habituation of EC or  feet more narrow/compliant surfaces (?).  Head motions and VOR for HEP   Lonia Blood, PT 06/08/23 3:21 PM Phone: [phone] Fax: [phone]  [facility] at [facility] [address], [address] [place], [place] [address] Phone # [phone] Fax # [phone]

## 2023-06-13 ENCOUNTER — Ambulatory Visit: Payer: Medicare HMO

## 2023-06-13 DIAGNOSIS — R262 Difficulty in walking, not elsewhere classified: Secondary | ICD-10-CM

## 2023-06-13 DIAGNOSIS — R42 Dizziness and giddiness: Secondary | ICD-10-CM | POA: Diagnosis not present

## 2023-06-13 DIAGNOSIS — M6281 Muscle weakness (generalized): Secondary | ICD-10-CM | POA: Diagnosis not present

## 2023-06-13 DIAGNOSIS — R2681 Unsteadiness on feet: Secondary | ICD-10-CM | POA: Diagnosis not present

## 2023-06-13 NOTE — Therapy (Signed)
OUTPATIENT PHYSICAL THERAPY VESTIBULAR TREATMENT NOTE   Patient Name: [name] MRN: [id number] DOB:10/02/1941, 82 y.o., female Today's Date: 06/13/2023  Progress Note Reporting Period 05/23/23 to 06/13/23  See note below for Objective Data and Assessment of Progress/Goals.       END OF SESSION:  PT End of Session - 06/13/23 1324     Visit Number 10    Number of Visits 15    Date for PT Re-Evaluation 06/20/23    Authorization Type [employer] Medicare//BCBS    PT Start Time 1320    PT Stop Time 1400    PT Time Calculation (min) 40 min    Equipment Utilized During Treatment Gait belt    Activity Tolerance Patient tolerated treatment well    Behavior During Therapy WFL for tasks assessed/performed                 Past Medical History:  Diagnosis Date   Allergy    Arthritis    Atherosclerosis of aorta (HCC) 2019   ct   BOOP (bronchiolitis obliterans with organizing pneumonia) (HCC) 2003   Bronchiectasis    Cataract    Esophageal dysmotility    Esophageal ulcer    Gastroparesis    GERD (gastroesophageal reflux disease)    severe   Headache    migraines   Hiatal hernia    neuropathy right foot and leg   Iron deficiency anemia    Macular degeneration (senile) of retina    Neuropathy    Nutcracker esophagus    Osteopenia    Pneumonia    Pulmonary fibrosis (HCC)    Raynaud's disease    Recurrent pneumonia    Due to aspiration   Thrombocytosis    Past Surgical History:  Procedure Laterality Date   31 HOUR PH STUDY N/A 10/13/2015   Procedure: 24 HOUR PH STUDY;  Surgeon: Iva Boop, MD;  Location: [facility];  Service: Endoscopy;  Laterality: N/A;   BREAST LUMPECTOMY     left, x 2   CATARACT EXTRACTION     both eyes   CHOLECYSTECTOMY  2005   COLONOSCOPY     ESOPHAGEAL MANOMETRY N/A 10/06/2015   Procedure: ESOPHAGEAL MANOMETRY (EM);  Surgeon: Ruffin Frederick, MD;  Location: [facility];  Service: Gastroenterology;  Laterality: N/A;    ESOPHAGEAL MANOMETRY N/A 03/10/2018   Procedure: ESOPHAGEAL MANOMETRY (EM);  Surgeon: Napoleon Form, MD;  Location: [facility];  Service: Endoscopy;  Laterality: N/A;   ESOPHAGEAL MANOMETRY N/A 10/15/2020   Procedure: ESOPHAGEAL MANOMETRY (EM);  Surgeon: Shellia Cleverly, DO;  Location: [facility];  Service: Gastroenterology;  Laterality: N/A;   ESOPHAGOGASTRODUODENOSCOPY (EGD) WITH PROPOFOL N/A 08/02/2022   Procedure: ESOPHAGOGASTRODUODENOSCOPY (EGD) WITH PROPOFOL;  Surgeon: Benancio Deeds, MD;  Location: [facility];  Service: Gastroenterology;  Laterality: N/A;   FOREIGN BODY REMOVAL  08/02/2022   Procedure: FOREIGN BODY REMOVAL;  Surgeon: Benancio Deeds, MD;  Location: [facility];  Service: Gastroenterology;;   LUNG BIOPSY     vats right 2003   SAVORY DILATION N/A 08/02/2022   Procedure: SAVORY DILATION;  Surgeon: Benancio Deeds, MD;  Location: [facility];  Service: Gastroenterology;  Laterality: N/A;   TOTAL ABDOMINAL HYSTERECTOMY     UPPER GASTROINTESTINAL ENDOSCOPY     Patient Active Problem List   Diagnosis Date Noted   Osteopenia of multiple sites 01/05/2023   Porokeratosis 01/05/2023   Pain due to onychomycosis of toenails of both feet 01/05/2023   Bilateral dry eyes 01/07/2022  Pain in both lower extremities 12/23/2021   Purple toe syndrome of both feet (HCC) 12/23/2021   Iron deficiency anemia 09/25/2021   Senile purpura (HCC) 09/25/2021   Syndrome of inappropriate secretion of antidiuretic hormone (HCC) 09/25/2021   Macular degeneration 03/18/2021   Choroidal nevus, left eye 01/01/2021   Early stage nonexudative age-related macular degeneration of both eyes 01/01/2021   Thrombocytosis 12/08/2020   Bronchiolectasis (HCC) 11/27/2020   Hardening of the aorta (main artery of the heart) (HCC) 11/27/2020   Idiopathic progressive polyneuropathy 11/27/2020   Intestinal malabsorption 11/27/2020   Malnutrition of mild degree Lily Kocher: 75% to less than  90% of standard weight) (HCC) 11/27/2020   Pulmonary fibrosis (HCC) 11/27/2020   Urge incontinence of urine 11/27/2020   Protein calorie malnutrition (HCC) 11/24/2020   Left epiretinal membrane 04/01/2020   Posterior vitreous detachment of both eyes 04/01/2020   Burning sensation of feet 02/17/2020   Venous insufficiency 01/02/2019   Carpal tunnel syndrome of right wrist 11/24/2018   Sore throat 12/08/2017   Hoarseness 12/08/2017   Abnormal CT scan 12/06/2017   Laryngopharyngeal reflux (LPR) 10/03/2017   Temporomandibular jaw dysfunction 10/03/2017   Tension-type headache, not intractable 09/12/2017   Abdominal pain 04/28/2017   Other fatigue 04/28/2017   Pneumonia 12/14/2016   Neuropathy 07/30/2016   TIA (transient ischemic attack) 06/22/2016   New onset of headaches after age 89 06/22/2016   Dysphagia    Allergic reaction 01/11/2015   Arthritis of right ankle 10/09/2013   Pes cavus 10/09/2013   Loss of transverse plantar arch 10/09/2013   Allergic rhinitis 05/14/2013   BRONCHIECTASIS 12/15/2010   Nutcracker esophagus 01/26/2008   GERD 01/26/2008   GASTROPARESIS 01/26/2008    PCP: Thana Ates REFERRING PROVIDER: Van Clines, MD  REFERRING DIAG: R29.6 (ICD-10-CM) - Frequent falls R42 (ICD-10-CM) - Dizziness M54.2 (ICD-10-CM) - Neck pain  THERAPY DIAG:  Unsteadiness on feet  Dizziness and giddiness  Muscle weakness (generalized)  Difficulty in walking, not elsewhere classified  ONSET DATE: "several years"  Rationale for Evaluation and Treatment: Rehabilitation  SUBJECTIVE:   SUBJECTIVE STATEMENT: [facility]ed lifting weights at the [facility]. Continues to have dizziness ("whirly bird") all the time and independent of position  Pt accompanied by: self  PERTINENT HISTORY:  The patient was last seen 3 months ago for neuropathy with frequent falls. She called for an earlier visit after a fall a week prior with more dizziness, headache, and confusion. She states she  has not been feeling well. She describes a sensation of feeling lightheaded as well as spinning. Sometimes she has a headache. She feels nauseated, no vomiting. She reports 5 falls in the past year, feeling this unwell sensation since the first fall. She injured her right knee with the last fall and saw Ortho yesterday. She was bending down to pick something up and fell, right knee popped out. She states the dizziness lasts all day, not as severe at night  PAIN:  Are you having pain? Yes: NPRS scale: 0/10 Pain location: head Pain description: dizzy Aggravating factors: unsure, movement Relieving factors: staying still  PRECAUTIONS: Fall  WEIGHT BEARING RESTRICTIONS: No  FALLS: Has patient fallen in last 6 months? Yes. Number of falls 5  LIVING ENVIRONMENT: Lives with: lives alone Lives in: House/apartment Stairs: No Has following equipment at home: Single point cane  PLOF: Independent with basic ADLs, Independent with household mobility with device, and Independent with community mobility with device  PATIENT GOALS: improve symptoms and improve balance  OBJECTIVE:  TODAY'S TREATMENT: 06/13/23 Activity Comments  STG/LTG reviewed   Vitals 147/94 mmHg, 71 bpm, 95% Seated baseline dizzy: 7/10  : 100 ft w/ SPC, reports increased dizziness Vitals post-test: 162/99 mmHg, 74 bpm, 90-94%  Pt education regarding POC details   HEP review   Gait training Trials in ascending/descending curbs w/ SPC and SBA-CGA       HOME EXERCISE PROGRAM Last updated: 05/16/23 Access Code: [email or web address] URL: [email or web address] Date: 05/16/2023 Prepared by: [facility] - Outpatient  Rehab - [facility]  Program Notes start at the lowest weight and slowly build up from there. try to perform 2 sets of 10 at a weight that you can maintain good form with.  Exercises - Hamstring Curl with Weight Machine  - 1 x daily - 5 x weekly - 2 sets - 10 reps - Knee Extension with Weight Machine   - 1 x daily - 5 x weekly - 2 sets - 10 reps - Full Leg Press  - 1 x daily - 5 x weekly - 2 sets - 10 reps - Hip Abduction Machine  - 1 x daily - 5 x weekly - 2 sets - 10 reps - Hip Adduction Machine  - 1 x daily - 5 x weekly - 2 sets - 10 reps - Seated Gaze Stabilization with Head Rotation  - 1 x daily - 7 x weekly - 3 sets - 10 reps  PATIENT EDUCATION: Education details: recommended to continue current HEP Person educated: Patient Education method: Explanation, Demonstration, Tactile cues, Verbal cues, and Handouts Education comprehension: verbalized understanding and returned demonstration    -------------------------------------------------------- Objective measures below taken at initial evaluation:  DIAGNOSTIC FINDINGS: MRI of head/neck scheduled  COGNITION: Overall cognitive status: Within functional limits for tasks assessed   SENSATION: Not tested, pt reports diminished sensation from knees distal  EDEMA:  Wearing compression wraps on ankles    POSTURE:  No Significant postural limitations and generally frail and cachectic appearing  Cervical ROM:    Active A/PROM (deg) eval  Flexion 50 deg  Extension 50 deg  Right lateral flexion 20 deg  Left lateral flexion 30 deg  Right rotation 45  Left rotation 30  (Blank rows = not tested)  :   LOWER EXTREMITY MMT:   BLE strength 3/5 gross as assessed in sitting  BED MOBILITY:  indep  TRANSFERS: Assistive device utilized: Single point cane and None  Sit to stand: Modified independence Stand to sit: Modified independence Chair to chair: Modified independence Floor: NT    CURB: CGA  GAIT: Gait pattern: decreased stride length, wide BOS, poor foot clearance- Right, and poor foot clearance- Left Distance walked:  Assistive device utilized: Single point cane Level of assistance: Modified independence and SBA Comments:     VESTIBULAR ASSESSMENT:  GENERAL OBSERVATION: wears progressive  lens   SYMPTOM BEHAVIOR:  Subjective history: prolonged hx of dizziness, unsteadiness, and frequent falls  Non-Vestibular symptoms: changes in hearing, neck pain, headaches, tinnitus, and right ear pain  Type of dizziness: "Funny feeling in the head"  Frequency: "all the time"  Duration: "all the time but fluctuates"  Aggravating factors: No known aggravating factors  Relieving factors: no known relieving factors  Progression of symptoms: worse  OCULOMOTOR EXAM:  Ocular Alignment: normal  Ocular ROM: No Limitations  Spontaneous Nystagmus: absent  Gaze-Induced Nystagmus: absent  Smooth Pursuits: intact  Saccades: hypometric/undershoots and slow  Convergence/Divergence: 3 cm     VESTIBULAR - OCULAR REFLEX:   Slow VOR: Comment: reports  feeling heaviness in head  VOR Cancellation: Comment: normal response, moves slowly  Head-Impulse Test: HIT Right: negative HIT Left: negative  Dynamic Visual Acuity: Not able to be assessed   POSITIONAL TESTING: Right Dix-Hallpike: unable to assess due to coughing/position Left Dix-Hallpike: no nystagmus, difficult to assess due to coughing Right Roll Test: no nystagmus Left Roll Test: no nystagmus  MOTION SENSITIVITY:  Motion Sensitivity Quotient Intensity: 0 = none, 1 = Lightheaded, 2 = Mild, 3 = Moderate, 4 = Severe, 5 = Vomiting  Intensity  1. Sitting to supine   2. Supine to L side   3. Supine to R side   4. Supine to sitting   5. L Hallpike-Dix   6. Up from L    7. R Hallpike-Dix   8. Up from R    9. Sitting, head tipped to L knee   10. Head up from L knee   11. Sitting, head tipped to R knee   12. Head up from R knee   13. Sitting head turns x5   14.Sitting head nods x5   15. In stance, 180 turn to L    16. In stance, 180 turn to R     OTHOSTATICS: unable to do  FUNCTIONAL GAIT:  TBD      GOALS: Goals reviewed with patient? Yes  SHORT TERM GOALS: Target date: same as LTG   LONG TERM GOALS: Target date:  06/20/2023      The patient will be independent with HEP for gaze adaptation, habituation, balance, and general mobility. Baseline:  Goal status: MET  2.  Demo mild sway condition 4 M-CTSIB x 15 sec to improve safety in standing during ADL Baseline: 3.03 sec condition 4 05/02/2023; (05/23/23) 8 sec; (06/13/23) 30 sec mild sway Goal status: MET  3.  Demo BLE gross strength 4/5 to improve stability and activity tolerance Baseline: 3/5; (05/23/23) 3+/5;  Goal status: IN PROGRESS  4.  Demo improved ambulation tolerance with least restrictive AD per 250 ft distance during Baseline: 115 ft 05/02/2023 baseline; (05/23/23) 120 ft w/ cane; (06/13/23) 100 ft Goal status: IN PROGRESS    ASSESSMENT:  CLINICAL IMPRESSION: Pt reports minimal change in symptoms with feeling of dizziness ("whirly bird in the head") remains pervasive and is constant despite change of position and is intensified with physical exertion and with head/body movements.  Demo some improvement in standing balance capabilities with improved stability under demands of Romberg position.  Pt reports she has been attending local [employer] for exercise nearly daily.  Minimal change in gait speed/tolerance as evidenced by .  Patient report of symptoms has been unchanged throughout POC.   OBJECTIVE IMPAIRMENTS: Abnormal gait, decreased activity tolerance, decreased balance, decreased endurance, decreased knowledge of use of DME, difficulty walking, decreased strength, and dizziness.   ACTIVITY LIMITATIONS: carrying, lifting, bending, standing, reach over head, and locomotion level  PARTICIPATION LIMITATIONS: meal prep, cleaning, driving, shopping, and community activity  PERSONAL FACTORS: Age, Time since onset of injury/illness/exacerbation, and 3+ comorbidities: PMH  are also affecting patient's functional outcome.   REHAB POTENTIAL: Fair based on time since onset, chronic nature, interaction of conditions  CLINICAL DECISION MAKING:  Evolving/moderate complexity  EVALUATION COMPLEXITY: Moderate   PLAN:  PT FREQUENCY: 2x/week  PT DURATION: 4 weeks  PLANNED INTERVENTIONS: Therapeutic exercises, Therapeutic activity, Neuromuscular re-education, Balance training, Gait training, Patient/Family education, Self Care, Joint mobilization, Stair training, Vestibular training, Canalith repositioning, DME instructions, Aquatic Therapy, Dry Needling, Spinal mobilization, Cryotherapy, Moist heat, and  Manual therapy  PLAN FOR NEXT SESSION:  Head motions and VOR for HEP   3:44 PM, 06/13/23 M. Shary Decamp, PT, DPT Physical Therapist- [place] Office Number: [phone]

## 2023-06-14 ENCOUNTER — Telehealth: Payer: Self-pay

## 2023-06-14 DIAGNOSIS — R42 Dizziness and giddiness: Secondary | ICD-10-CM

## 2023-06-14 DIAGNOSIS — M542 Cervicalgia: Secondary | ICD-10-CM

## 2023-06-14 NOTE — Telephone Encounter (Signed)
-----   Message from Glendale Chard sent at 06/13/2023  3:55 PM EDT ----- Please let pt know that MRI brain looks good, nothing to explain her dizziness.  MRI cervical spine shows degenerative changes which could explain some of her neck pain.  There is also evidence of some changes in the neck muscle which may indicate muscle strain or muscle inflammation.  I recommend additional labs work to evaluate this - check ESR, CRP, CK, aldolase.  If labs suggest muscle breakdown, she may need to have EMG but we can decide this after we get lab results.  Thank you.

## 2023-06-14 NOTE — Telephone Encounter (Signed)
Pt called an informed that MRI brain looks good, nothing to explain her dizziness.  MRI cervical spine shows degenerative changes which could explain some of her neck pain.  There is also evidence of some changes in the neck muscle which may indicate muscle strain or muscle inflammation. Dr Allena Katz recommend additional labs work to evaluate this - check ESR, CRP, CK, aldolase.  If labs suggest muscle breakdown, she may need to have EMG but we can decide this after we get lab results.

## 2023-06-20 ENCOUNTER — Encounter: Payer: Self-pay | Admitting: Neurology

## 2023-06-20 ENCOUNTER — Other Ambulatory Visit (INDEPENDENT_AMBULATORY_CARE_PROVIDER_SITE_OTHER): Payer: Medicare HMO

## 2023-06-20 ENCOUNTER — Ambulatory Visit: Payer: Medicare HMO | Attending: Neurology

## 2023-06-20 ENCOUNTER — Ambulatory Visit (INDEPENDENT_AMBULATORY_CARE_PROVIDER_SITE_OTHER): Payer: Medicare HMO | Admitting: Neurology

## 2023-06-20 VITALS — BP 151/90 | HR 78 | Ht 64.0 in | Wt 79.0 lb

## 2023-06-20 DIAGNOSIS — M479 Spondylosis, unspecified: Secondary | ICD-10-CM

## 2023-06-20 DIAGNOSIS — R296 Repeated falls: Secondary | ICD-10-CM

## 2023-06-20 DIAGNOSIS — G629 Polyneuropathy, unspecified: Secondary | ICD-10-CM

## 2023-06-20 DIAGNOSIS — R42 Dizziness and giddiness: Secondary | ICD-10-CM

## 2023-06-20 DIAGNOSIS — M629 Disorder of muscle, unspecified: Secondary | ICD-10-CM | POA: Diagnosis not present

## 2023-06-20 DIAGNOSIS — M542 Cervicalgia: Secondary | ICD-10-CM | POA: Diagnosis not present

## 2023-06-20 LAB — CK: Total CK: 216 U/L — ABNORMAL HIGH (ref 7–177)

## 2023-06-20 LAB — C-REACTIVE PROTEIN: CRP: <1 mg/dL (ref 0.5–20.0)

## 2023-06-20 LAB — SEDIMENTATION RATE: Sed Rate: 30 mm/hr (ref 0–30)

## 2023-06-20 MED ORDER — GABAPENTIN 250 MG/5ML PO SOLN: ORAL | 12 refills | Status: DC

## 2023-06-20 NOTE — Progress Notes (Signed)
NEUROLOGY FOLLOW UP OFFICE NOTE  [name] [id number] June 29, 1941  HISTORY OF PRESENT ILLNESS: I had the pleasure of seeing [name] in follow-up in the neurology clinic on 06/20/2023.  The patient was last seen 2 months ago for an increase in falls, with headaches, dizziness, and confusion. She is alone in the office today. Records and images were personally reviewed where available.  I personally reviewed brain MRI with and without contrast done 05/2023 which did not show any acute changes. There was diffuse atrophy, chronic microvascular disease with multifocal white matter changes and a single chronic microhemorrhage in the left temporal lobe. Cervical MRI without contrast which showed diffuse edema throughout the posterior paraspinous musculature, multilevel cervical spondylosis with mild diffuse spinal stenosis at C3-4 through C5-6, moderate right C4 and C7 foraminal stenosis and moderate left C5 and C6 foraminal narrowing. Bloodwork for muscle changes recommended, she has not done them yet.  She feels the physical therapy is helping, she also goes to the [facility] on a regular basis. She continues to note dizziness and balance problems, she has a bad time when leaning forward, making her lose balance. She cannot recall the last time she had a fall to the floor. She always uses a cane. She has been having pain on the right side of her neck radiating to the right side of her head. She has not been taking the Gabapentin for neuropathic pain.    HPI: This is a pleasant 82 yo RH woman with a history of GERD, nutcracker esophagus, pulmonary fibrosis, in her usual state of health until early June 2017 while walking into a grocery she suddenly had left temporal pain, then it felt like she was hit on both side of the head with a hammer. She reports she was "hit like gangbusters," she was very dizzy with a spinning sensation, nauseated. She held on to the cart for balance and had difficulty concentrating.  Her vision was blurred. She managed to finish her chore and walked to the car, then noticed that she was having a hard time talking. She could see the sentence broken up in her head and could not get the words out. She was able to drive home with no focal symptoms noted. The episode lasted 30 minutes or so, she felt weak and tired after. Since then she has had at least 2 or 3 more episodes of headaches with dizziness, which did not progress to speech difficulties. The symptoms would last around 5 minutes, no clear positional component to the dizziness. When she feels it coming on, she massages her head, which seems to help. She was unsure if Tylenol helped. She denies any prior history of headaches. She denies any vision loss, photo/phonophobia, diplopia, dysarthria, jaw claudication, hearing loss. She has occasional pulsatile tinnitus more in the afternoons. She has GERD which makes swallowing difficult. She has a history of neuropathy affecting both feet, R>L, with burning and numbness/tingling for several years. She has been doing laser therapy for venous insufficiency/varicose veins. She denies any falls but trips more. Sleep varies. She is very active and rides her bike for a couple of hours daily. She works at a preschool. Her mother had a stroke at age 11.   Diagnostic Data:  Venous dopplers showed chronic venous insufficiency, duplex showed limited valvular insufficiency.    EMG/NCV of the right UE and LE confirmed moderate chronic sensorimotor axonal polyneuropathy. There was also note of moderate right ulnar neuropathy with slowing across the elbow and  mild right median neuropathy consistent with carpal tunnel syndrome.   EMG at [facility] showing mild sensory neuropathy and negative punch biopsy for small fiber neuropathy, no evidence of vasculitis.  She has also been evaluated by Rhuematology for Reynauds disease and by Vascular at [facility] last June 2021. Records were reviewed, she has  severe venous insufficiency, mostly in the deep veins. It was discussed how this contributes to majority of her symptoms, including neuropathy. There were severe skin changes compatible with stasis dermatitis and venous stasis. She was prescribed knee high compression stockings.  PAST MEDICAL HISTORY: Past Medical History:  Diagnosis Date   Allergy    Arthritis    Atherosclerosis of aorta (HCC) 2019   ct   BOOP (bronchiolitis obliterans with organizing pneumonia) (HCC) 2003   Bronchiectasis    Cataract    Esophageal dysmotility    Esophageal ulcer    Gastroparesis    GERD (gastroesophageal reflux disease)    severe   Headache    migraines   Hiatal hernia    neuropathy right foot and leg   Iron deficiency anemia    Macular degeneration (senile) of retina    Neuropathy    Nutcracker esophagus    Osteopenia    Pneumonia    Pulmonary fibrosis (HCC)    Raynaud's disease    Recurrent pneumonia    Due to aspiration   Thrombocytosis     MEDICATIONS: Current Outpatient Medications on File Prior to Visit  Medication Sig Dispense Refill   acetaminophen (TYLENOL) 500 MG tablet Take 500 mg by mouth daily as needed (pain).     azithromycin (ZITHROMAX) 500 MG tablet Take 1 tablet (500 mg total) by mouth daily. 5 tablet 0   benzonatate (TESSALON) 200 MG capsule Take 1 capsule (200 mg total) by mouth 3 (three) times daily as needed for cough. 90 capsule 3   Calcium-Vitamin D-Vitamin K (VIACTIV CALCIUM PLUS D) 650-12.5-40 MG-MCG-MCG CHEW Chew 1 each by mouth daily.     guaiFENesin (MUCINEX) 600 MG 12 hr tablet Take 600 mg by mouth 2 (two) times daily as needed for to loosen phlegm or cough.     omeprazole (PRILOSEC) 40 MG capsule TAKE ONE CAPSULE BY MOUTH TWICE DAILY 60 capsule 6   pentoxifylline (TRENTAL) 400 MG CR tablet Take 1 tablet at night with meals 30 tablet 6   sucralfate (CARAFATE) 1 g tablet Take 1 tablet (1 g total) by mouth every 6 (six) hours as needed. Slowly dissolve 1  tablet in 1 Tablespoon of water before ingesting 60 tablet 3   No current facility-administered medications on file prior to visit.    ALLERGIES: Allergies  Allergen Reactions   Prednisone     Does not remember reaction    Sulfa Antibiotics     Does not remember reaction    Cortisone Other (See Comments)    Does not remember reaction    Sulfonamide Derivatives Other (See Comments)    Does not remember reaction     FAMILY HISTORY: Family History  Problem Relation Age of Onset   Lymphoma Mother    Heart disease Father    Lung cancer Paternal Uncle    Heart disease Paternal Grandfather    Leukemia Paternal Grandmother    Cancer Maternal Grandmother        unknown type   Colon cancer Neg Hx    Esophageal cancer Neg Hx    Rectal cancer Neg Hx    Stomach cancer Neg Hx  SOCIAL HISTORY: Social History   Socioeconomic History   Marital status: Widowed    Spouse name: Not on file   Number of children: 2   Years of education: Not on file   Highest education level: Not on file  Occupational History   Occupation: retired Runner, broadcasting/film/video    Comment: still teaching pre K   Tobacco Use   Smoking status: Former    Current packs/day: 0.00    Average packs/day: 0.3 packs/day for 10.0 years (2.5 ttl pk-yrs)    Types: Cigarettes    Start date: 11/15/1958    Quit date: 11/15/1968    Years since quitting: 54.6   Smokeless tobacco: Never  Vaping Use   Vaping status: Never Used  Substance and Sexual Activity   Alcohol use: No   Drug use: No   Sexual activity: Not Currently    Partners: Male  Other Topics Concern   Not on file  Social History Narrative   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 - Retired 43yrs      Widowed      Lives alone   Lives one story townhouse      Right handed.   Social Determinants of Health   Financial Resource Strain: Not on file  Food Insecurity: Not on file  Transportation Needs: Not on file  Physical Activity: Not on file   Stress: Not on file  Social Connections: Not on file  Intimate Partner Violence: Not on file     PHYSICAL EXAM: Vitals:   06/20/23 1037 06/20/23 1046  BP: (!) 155/97 (!) 151/90  Pulse: 78   SpO2: 99%    General: No acute distress Head:  Normocephalic/atraumatic Skin/Extremities: +bilateral feet edema with discoloration Neurological Exam: alert and awake. No aphasia or dysarthria. Fund of knowledge is appropriate.  Attention and concentration are normal.   Cranial nerves: Pupils equal, round. Extraocular movements intact with no nystagmus. Visual fields full.  No facial asymmetry.  Motor: Bulk and tone normal, muscle strength 5/5 throughout with no pronator drift.  Reflexes +1 both UE, +2 bilateral patella. Finger to nose testing intact.  Gait slow and cautious with cane. No tremor.   IMPRESSION: This is a pleasant 82 yo RH woman with a history of GERD, nutcracker esophagus, pulmonary fibrosis, with significant neuropathy, who reported an increase in falls with dizziness, headaches, and confusion. MRI brain no acute changes. MRI cervical spine showed diffuse edema throughout the posterior paraspinous musculature, multilevel cervical spondylosis with mild diffuse spinal stenosis at C3-4 through C5-6, moderate right C4 and C7 foraminal stenosis and moderate left C5 and C6 foraminal narrowing. Bloodwork for muscle changes recommended, proceed with testing for myopathy. If abnormal, she will be scheduled for an EMG/ NCV. We discussed falls likely multifactorial from neuropathy and spinal spondylosis/stenosis, continue with physical therapy. She has help at home when needed. Continue use of assistive device. She will restart the gabapentin to hopefully also help with radiculopathy/neck pain. She declined Neurosurgery referral for epidural injections for now. Follow-up in 2 months, call for any changes.    Thank you for allowing me to participate in her care.  Please do not hesitate to call for any  questions or concerns.    Patrcia Dolly, M.D.   CC: Dr. Margaretann Loveless

## 2023-06-20 NOTE — Patient Instructions (Signed)
Good to see you.  Proceed with bloodwork ESR, CRP, CK, aldolase   2. Restart gabapentin as tolerated  3. Continue physical therapy  4. Follow-up in 2 months, call for any changes

## 2023-06-22 ENCOUNTER — Ambulatory Visit: Payer: Medicare HMO | Admitting: Podiatry

## 2023-06-22 DIAGNOSIS — M79675 Pain in left toe(s): Secondary | ICD-10-CM | POA: Diagnosis not present

## 2023-06-22 DIAGNOSIS — M79674 Pain in right toe(s): Secondary | ICD-10-CM | POA: Diagnosis not present

## 2023-06-22 DIAGNOSIS — G629 Polyneuropathy, unspecified: Secondary | ICD-10-CM

## 2023-06-22 DIAGNOSIS — B351 Tinea unguium: Secondary | ICD-10-CM

## 2023-06-22 DIAGNOSIS — Q828 Other specified congenital malformations of skin: Secondary | ICD-10-CM

## 2023-06-22 DIAGNOSIS — L84 Corns and callosities: Secondary | ICD-10-CM

## 2023-06-23 ENCOUNTER — Emergency Department (HOSPITAL_COMMUNITY): Payer: Medicare HMO

## 2023-06-23 ENCOUNTER — Emergency Department (HOSPITAL_COMMUNITY)
Admission: EM | Admit: 2023-06-23 | Discharge: 2023-06-23 | Disposition: A | Discharge status: Home / Self Care | Payer: Medicare HMO | Attending: Emergency Medicine | Admitting: Emergency Medicine

## 2023-06-23 ENCOUNTER — Encounter (HOSPITAL_COMMUNITY): Payer: Self-pay

## 2023-06-23 ENCOUNTER — Other Ambulatory Visit: Payer: Self-pay

## 2023-06-23 DIAGNOSIS — S0181XA Laceration without foreign body of other part of head, initial encounter: Secondary | ICD-10-CM | POA: Insufficient documentation

## 2023-06-23 DIAGNOSIS — S0990XA Unspecified injury of head, initial encounter: Secondary | ICD-10-CM | POA: Diagnosis not present

## 2023-06-23 DIAGNOSIS — I10 Essential (primary) hypertension: Secondary | ICD-10-CM | POA: Diagnosis not present

## 2023-06-23 DIAGNOSIS — M25462 Effusion, left knee: Secondary | ICD-10-CM | POA: Diagnosis not present

## 2023-06-23 DIAGNOSIS — S0101XA Laceration without foreign body of scalp, initial encounter: Secondary | ICD-10-CM | POA: Insufficient documentation

## 2023-06-23 DIAGNOSIS — Y92009 Unspecified place in unspecified non-institutional (private) residence as the place of occurrence of the external cause: Secondary | ICD-10-CM | POA: Diagnosis not present

## 2023-06-23 DIAGNOSIS — S81012A Laceration without foreign body, left knee, initial encounter: Secondary | ICD-10-CM | POA: Insufficient documentation

## 2023-06-23 DIAGNOSIS — R58 Hemorrhage, not elsewhere classified: Secondary | ICD-10-CM | POA: Diagnosis not present

## 2023-06-23 DIAGNOSIS — S41112A Laceration without foreign body of left upper arm, initial encounter: Secondary | ICD-10-CM | POA: Insufficient documentation

## 2023-06-23 DIAGNOSIS — Z043 Encounter for examination and observation following other accident: Secondary | ICD-10-CM | POA: Diagnosis not present

## 2023-06-23 DIAGNOSIS — W01198A Fall on same level from slipping, tripping and stumbling with subsequent striking against other object, initial encounter: Secondary | ICD-10-CM | POA: Diagnosis not present

## 2023-06-23 DIAGNOSIS — W19XXXA Unspecified fall, initial encounter: Secondary | ICD-10-CM | POA: Diagnosis not present

## 2023-06-23 MED ORDER — LIDOCAINE-EPINEPHRINE (PF) 2 %-1:200000 IJ SOLN
10.0000 mL | Freq: Once | INTRAMUSCULAR | Status: AC
  Administered 2023-06-23: 10 mL
  Filled 2023-06-23: qty 20

## 2023-06-23 MED ORDER — BACITRACIN ZINC 500 UNIT/GM EX OINT
TOPICAL_OINTMENT | Freq: Two times a day (BID) | CUTANEOUS | Status: DC
  Filled 2023-06-23: qty 0.9

## 2023-06-23 MED ORDER — LIDOCAINE-EPINEPHRINE-TETRACAINE (LET) TOPICAL GEL
3.0000 mL | Freq: Once | TOPICAL | Status: AC
  Administered 2023-06-23: 3 mL via TOPICAL
  Filled 2023-06-23: qty 3

## 2023-06-23 MED ORDER — ACETAMINOPHEN 325 MG PO TABS
650.0000 mg | ORAL_TABLET | Freq: Once | ORAL | Status: AC
  Administered 2023-06-23: 650 mg via ORAL
  Filled 2023-06-23: qty 2

## 2023-06-23 NOTE — ED Notes (Signed)
PA Smoot at bedside suturing forehead

## 2023-06-23 NOTE — ED Notes (Signed)
PA finishing up with sutures at bedside.

## 2023-06-23 NOTE — Discharge Instructions (Signed)
You were seen in the emergency department for your fall and head lacerations.  Imaging of your head, neck, left elbow, and left knee were all unremarkable for acute findings.  We have closed your laceration(s) with sutures and staples. These need to be removed in 7 days. This can be done at any doctor's office, urgent care, or emergency department.   If any of the sutures or staples come out before it is time for removal, that is okay. Make sure to keep the area as clean and dry as possible. You can let warm soapy warm run over the area, but do NOT scrub it.   Watch out for signs of infection, like we discussed, including: increased redness, tenderness, or drainage of pus from the area. If this happens and you have not been prescribed an antibiotic, please seek medical attention for possible infection.   You can take over the counter pain medicine like ibuprofen or tylenol as needed.

## 2023-06-23 NOTE — ED Notes (Signed)
Pt provided with AVS.  Education complete; all questions answered.  Pt leaving ED in stable condition at this time via wheelchair with all belongings.  Pt taken to ED entrance for discharge and assisted into family vehicle.

## 2023-06-23 NOTE — ED Notes (Signed)
Abrasion to left knee

## 2023-06-23 NOTE — ED Provider Notes (Signed)
[name] Provider Note   CSN: [id number] Arrival date & time: 06/23/23  1618     History  Chief Complaint  Patient presents with   [name]    SUMMERS [name] is a 82 y.o. female.  Patient with history of pulmonary fibrosis, GERD, neuropathy presents today with complaints of fall.  She thinks that her neuropathy in her left foot caused her to lose her balance earlier today and she subsequently struck her head on a countertop.  She did not lose consciousness.  She is not anticoagulated.  Her tetanus is up-to-date.  She also struck her left elbow and left knee and notes wounds and pain to same.  She has not tried to walk since.  She lives at home alone and walks with a cane at baseline.  She was not on the ground for an extended time.  The history is provided by the patient. No language interpreter was used.  Fall       Home Medications Prior to Admission medications   Medication Sig Start Date End Date Taking? Authorizing Provider  acetaminophen (TYLENOL) 500 MG tablet Take 500 mg by mouth daily as needed (pain).    [provider]  benzonatate (TESSALON) 200 MG capsule Take 1 capsule (200 mg total) by mouth 3 (three) times daily as needed for cough. 03/24/23   Parrett, Virgel Bouquet, NP  Calcium-Vitamin D-Vitamin K (VIACTIV CALCIUM PLUS D) 650-12.5-40 MG-MCG-MCG CHEW Chew 1 each by mouth daily.    [provider]  gabapentin (NEURONTIN) 250 MG/5ML solution Take 2mL every night 06/20/23   Van Clines, MD  omeprazole (PRILOSEC) 40 MG capsule TAKE ONE CAPSULE BY MOUTH TWICE DAILY 02/25/23   Armbruster, Willaim Rayas, MD  pentoxifylline (TRENTAL) 400 MG CR tablet Take 1 tablet at night with meals 01/21/23   Van Clines, MD  sucralfate (CARAFATE) 1 g tablet Take 1 tablet (1 g total) by mouth every 6 (six) hours as needed. Slowly dissolve 1 tablet in 1 Tablespoon of water before ingesting 05/16/23   Armbruster, Willaim Rayas, MD      Allergies     Prednisone, Sulfa antibiotics, Cortisone, and Sulfonamide derivatives    Review of Systems   Review of Systems  Skin:  Positive for wound.  All other systems reviewed and are negative.   Physical Exam Updated Vital Signs BP (!) 148/92   Pulse 81   Temp 97.8 F (36.6 C) (Oral)   Resp 16   Ht 5\' 4"  (1.626 m)   Wt 35.4 kg   SpO2 98%   BMI 13.39 kg/m  Physical Exam Vitals and nursing note reviewed.  Constitutional:      General: She is not in acute distress.    Appearance: Normal appearance. She is normal weight. She is not ill-appearing, toxic-appearing or diaphoretic.     Comments: Cachectic in appearance  HENT:     Head: Normocephalic.     Comments: No racoon eyes No battle sign  10 cm horizontal gaping laceration to the right upper forehead and into the scalp area.  No active bleeding.  Donnetta Hutching appears intact.  Intact facial muscles.   4 cm dogear laceration noted to the left parietal scalp without active bleeding. Eyes:     Extraocular Movements: Extraocular movements intact.     Pupils: Pupils are equal, round, and reactive to light.  Cardiovascular:     Rate and Rhythm: Normal rate.  Pulmonary:     Effort:  Pulmonary effort is normal. No respiratory distress.  Abdominal:     General: Abdomen is flat.     Palpations: Abdomen is soft.     Tenderness: There is no abdominal tenderness.  Musculoskeletal:        General: Normal range of motion.     Cervical back: Normal, normal range of motion and neck supple. No tenderness.     Thoracic back: Normal.     Lumbar back: Normal.     Comments: No midline tenderness, no stepoffs or deformity noted on palpation of cervical, thoracic, and lumbar spine  Skin:    General: Skin is warm and dry.     Comments: Small superficial skin tear noted to the distal left upper arm.  Mild tenderness to palpation in this area.  No active bleeding.  No deformity.  Radial pulse intact and compartments are soft.  ROM intact.  Superficial  skin tear noted to the left anterior knee without active bleeding or deformity.  Distal pulses present.  ROM intact with some discomfort.  No external rotation or shortening of the leg.  Neurological:     General: No focal deficit present.     Mental Status: She is alert and oriented to person, place, and time.     Cranial Nerves: No cranial nerve deficit.     Comments: 5/5 strength and sensation intact in bilateral upper and lower extremities.  Patient able to stand and walk with 1 assistant per her baseline.  Psychiatric:        Mood and Affect: Mood normal.        Behavior: Behavior normal.     ED Results / Procedures / Treatments   Labs (all labs ordered are listed, but only abnormal results are displayed) Labs Reviewed - No data to display  EKG None  Radiology CT Head Wo Contrast  Result Date: 06/23/2023 CLINICAL DATA:  Ground level fall. EXAM: CT HEAD WITHOUT CONTRAST CT CERVICAL SPINE WITHOUT CONTRAST TECHNIQUE: Multidetector CT imaging of the head and cervical spine was performed following the standard protocol without intravenous contrast. Multiplanar CT image reconstructions of the cervical spine were also generated. RADIATION DOSE REDUCTION: This exam was performed according to the departmental dose-optimization program which includes automated exposure control, adjustment of the mA and/or kV according to patient size and/or use of iterative reconstruction technique. COMPARISON:  MRI brain and cervical spine dated June 02, 2023. FINDINGS: CT HEAD FINDINGS Brain: No evidence of acute infarction, hemorrhage, hydrocephalus, extra-axial collection or mass lesion/mass effect. Vascular: No hyperdense vessel or unexpected calcification. Skull: Normal. Negative for fracture or focal lesion. Sinuses/Orbits: No acute finding. Other: Right frontal scalp laceration and hematoma. CT CERVICAL SPINE FINDINGS Alignment: No traumatic malalignment. Unchanged mild retrolisthesis at C3-C4. Skull base  and vertebrae: No acute fracture. No primary bone lesion or focal pathologic process. Soft tissues and spinal canal: No prevertebral fluid or swelling. No visible canal hematoma. Disc levels: Similar multilevel degenerative changes without high-grade spinal canal stenosis. Upper chest: No acute finding. Other: None. IMPRESSION: 1. No acute intracranial abnormality. Right frontal scalp laceration and hematoma. 2. No acute cervical spine fracture or traumatic malalignment. Electronically Signed   By: Obie Dredge M.D.   On: 06/23/2023 17:46   CT Cervical Spine Wo Contrast  Result Date: 06/23/2023 CLINICAL DATA:  Ground level fall. EXAM: CT HEAD WITHOUT CONTRAST CT CERVICAL SPINE WITHOUT CONTRAST TECHNIQUE: Multidetector CT imaging of the head and cervical spine was performed following the standard protocol without intravenous contrast. Multiplanar  CT image reconstructions of the cervical spine were also generated. RADIATION DOSE REDUCTION: This exam was performed according to the departmental dose-optimization program which includes automated exposure control, adjustment of the mA and/or kV according to patient size and/or use of iterative reconstruction technique. COMPARISON:  MRI brain and cervical spine dated June 02, 2023. FINDINGS: CT HEAD FINDINGS Brain: No evidence of acute infarction, hemorrhage, hydrocephalus, extra-axial collection or mass lesion/mass effect. Vascular: No hyperdense vessel or unexpected calcification. Skull: Normal. Negative for fracture or focal lesion. Sinuses/Orbits: No acute finding. Other: Right frontal scalp laceration and hematoma. CT CERVICAL SPINE FINDINGS Alignment: No traumatic malalignment. Unchanged mild retrolisthesis at C3-C4. Skull base and vertebrae: No acute fracture. No primary bone lesion or focal pathologic process. Soft tissues and spinal canal: No prevertebral fluid or swelling. No visible canal hematoma. Disc levels: Similar multilevel degenerative changes  without high-grade spinal canal stenosis. Upper chest: No acute finding. Other: None. IMPRESSION: 1. No acute intracranial abnormality. Right frontal scalp laceration and hematoma. 2. No acute cervical spine fracture or traumatic malalignment. Electronically Signed   By: Obie Dredge M.D.   On: 06/23/2023 17:46   DG Knee Complete 4 Views Left  Result Date: 06/23/2023 CLINICAL DATA:  Fall. EXAM: LEFT KNEE - COMPLETE 4+ VIEW COMPARISON:  None Available. FINDINGS: There is diffuse decreased bone mineralization. Mild patellofemoral joint space narrowing and superior osteophytosis. Small joint effusion. Moderate medial and lateral compartment chondrocalcinosis. No acute fracture is seen. No dislocation. Mild mineralization along the course of the proximal medial collateral ligament on frontal view. IMPRESSION: 1. No acute fracture. 2. Small joint effusion. 3. Mild patellofemoral osteoarthritis. 4. Moderate medial and lateral compartment chondrocalcinosis. Electronically Signed   By: Neita Garnet M.D.   On: 06/23/2023 17:33   DG Elbow Complete Left  Result Date: 06/23/2023 CLINICAL DATA:  Fall. EXAM: LEFT ELBOW - COMPLETE 3+ VIEW COMPARISON:  None Available. FINDINGS: Mildly decreased bone mineralization. Mild degenerative osteophytosis at the medial and volar aspects of the coronoid process. No joint effusion is seen. No acute fracture or dislocation. Mild chondrocalcinosis. IMPRESSION: 1. No acute fracture or dislocation. 2. Mild medial elbow degenerative osteophytosis. Electronically Signed   By: Neita Garnet M.D.   On: 06/23/2023 17:27    Procedures .Marland KitchenLaceration Repair  Date/Time: 06/23/2023 8:12 PM  Performed by: Silva Bandy, PA-C Authorized by: Silva Bandy, PA-C   Consent:    Consent obtained:  Verbal   Consent given by:  Patient   Risks, benefits, and alternatives were discussed: yes     Risks discussed:  Infection, need for additional repair, nerve damage, poor wound healing, poor  cosmetic result, pain, retained foreign body, tendon damage and vascular damage   Alternatives discussed:  No treatment, delayed treatment, observation and referral Universal protocol:    Procedure explained and questions answered to patient or proxy's satisfaction: yes     Imaging studies available: yes     Patient identity confirmed:  Verbally with patient Anesthesia:    Anesthesia method:  Topical application and local infiltration   Topical anesthetic:  LET   Local anesthetic:  Lidocaine 2% WITH epi Laceration details:    Location:  Face   Face location:  Forehead   Length (cm):  10   Depth (mm):  3 Exploration:    Hemostasis achieved with:  Direct pressure   Imaging outcome: foreign body not noted     Wound exploration: wound explored through full range of motion and entire depth of wound visualized  Treatment:    Area cleansed with:  Soap and water   Amount of cleaning:  Extensive   Irrigation solution:  Sterile saline   Irrigation volume:  1 L   Irrigation method:  Pressure wash Skin repair:    Repair method:  Sutures   Suture size:  5-0   Suture material:  Prolene   Suture technique:  Simple interrupted   Number of sutures:  12 Approximation:    Approximation:  Close Repair type:    Repair type:  Simple Post-procedure details:    Dressing:  Antibiotic ointment   Procedure completion:  Tolerated well, no immediate complications .Marland KitchenLaceration Repair  Date/Time: 06/23/2023 8:14 PM  Performed by: Silva Bandy, PA-C Authorized by: Silva Bandy, PA-C   Consent:    Consent obtained:  Verbal   Consent given by:  Patient   Risks, benefits, and alternatives were discussed: yes     Risks discussed:  Infection, need for additional repair, nerve damage, pain, poor cosmetic result, poor wound healing, vascular damage, tendon damage and retained foreign body   Alternatives discussed:  Delayed treatment, observation, referral and no treatment Universal protocol:     Procedure explained and questions answered to patient or proxy's satisfaction: yes     Imaging studies available: yes     Patient identity confirmed:  Verbally with patient Anesthesia:    Anesthesia method:  Local infiltration   Local anesthetic:  Lidocaine 2% WITH epi Laceration details:    Location:  Scalp   Scalp location:  L parietal   Length (cm):  4   Depth (mm):  3 Exploration:    Hemostasis achieved with:  Direct pressure   Imaging outcome: foreign body not noted     Wound exploration: wound explored through full range of motion and entire depth of wound visualized   Treatment:    Area cleansed with:  Soap and water   Amount of cleaning:  Standard   Irrigation solution:  Sterile saline   Irrigation volume:  500 ml   Irrigation method:  Pressure wash Skin repair:    Repair method:  Staples   Number of staples:  5 Approximation:    Approximation:  Close Repair type:    Repair type:  Simple Post-procedure details:    Dressing:  Antibiotic ointment   Procedure completion:  Tolerated well, no immediate complications     Medications Ordered in ED Medications  bacitracin ointment (has no administration in time range)  acetaminophen (TYLENOL) tablet 650 mg (650 mg Oral Given 06/23/23 1714)  lidocaine-EPINEPHrine-tetracaine (LET) topical gel (3 mLs Topical Given 06/23/23 1722)  lidocaine-EPINEPHrine (XYLOCAINE W/EPI) 2 %-1:200000 (PF) injection 10 mL (10 mLs Infiltration Given 06/23/23 1715)    ED Course/ Medical Decision Making/ A&P                                 Medical Decision Making Amount and/or Complexity of Data Reviewed Radiology: ordered.  Risk OTC drugs. Prescription drug management.   This patient is a 81 y.o. female  who presents to the ED for concern of fall.   Differential diagnoses prior to evaluation: The emergent differential diagnosis includes, but is not limited to,  trauma . This is not an exhaustive differential.   Past Medical History /  Co-morbidities / Social History: history of pulmonary fibrosis, GERD, neuropathy  Additional history: Chart reviewed.  Physical Exam: Physical exam performed. The pertinent findings include: several  wounds and lacerations per above  Lab Tests/Imaging studies: I personally interpreted labs/imaging and the pertinent results include:  CT head, cervical spine, DG left knee, dg left elbow. No acute findings. I agree with the radiologist interpretation.   Medications: I ordered medication including tylenol for pain.  I have reviewed the patients home medicines and have made adjustments as needed.   Disposition:  Pressure irrigation performed of patient's wounds. Wound explored and base of wound visualized in a bloodless field without evidence of foreign body.  Laceration occurred < 8 hours prior to repair which was well tolerated per above procedures. Tdap up-to-date.  Pt discharged  without antibiotics.  Discussed suture home care with patient and answered questions. Pt to follow-up for wound check and suture removal in 7 days; they are to return to the ED sooner for signs of infection. Pt is hemodynamically stable with no complaints prior to dc. emergency department workup does not suggest an emergent condition requiring admission or immediate intervention beyond what has been performed at this time. The plan is: d/c with close outpatient follow-up. Evaluation and diagnostic testing in the emergency department does not suggest an emergent condition requiring admission or immediate intervention beyond what has been performed at this time.  Plan for discharge with close PCP follow-up.  Patient is understanding and amenable with plan, educated on red flag symptoms that would prompt immediate return.  Patient discharged in stable condition.  This is a shared visit with supervising physician Dr. Eloise Harman who has independently evaluated patient & provided guidance in evaluation/management/disposition, in  agreement with care   Final Clinical Impression(s) / ED Diagnoses Final diagnoses:  Fall, initial encounter  Laceration of scalp, initial encounter    Rx / DC Orders ED Discharge Orders     None     An After Visit Summary was printed and given to the patient.     Vear Clock 06/23/23 2021    Rondel Baton, MD 06/24/23 [phone]

## 2023-06-23 NOTE — ED Notes (Signed)
X-ray at bedside

## 2023-06-23 NOTE — ED Notes (Signed)
Pt able to stand with minor assistance.  Feels comfortable with going home.

## 2023-06-23 NOTE — ED Notes (Signed)
Patient transported to CT 

## 2023-06-23 NOTE — ED Triage Notes (Signed)
Patient arrives [facility] due to a ground level fall from home, where she states her foot gave out. Patient hit head on kitchen counter. Complaint of left knee pain. Patient did not loss consciousness, takes no blood thinners.   Hypertensive with EMS 185/123.

## 2023-06-24 ENCOUNTER — Telehealth: Payer: Self-pay

## 2023-06-24 DIAGNOSIS — M609 Myositis, unspecified: Secondary | ICD-10-CM

## 2023-06-24 NOTE — Telephone Encounter (Signed)
Pt called n informed that the bloodwork showed slightly increased CK level which can indicate muscle breakdown. Because of this and the muscle findings on her cervical MRI that we discussed, Dr Karel Jarvis would proceed with the nerve and muscle test they talked about. Pt had a fall an has stitches she asked if we would take them out pt advised we do not take out stitches she needs to call the PCP

## 2023-06-24 NOTE — Telephone Encounter (Signed)
-----   Message from Van Clines sent at 06/24/2023 12:17 PM EDT ----- Pls let her know the bloodwork showed slightly increased CK level which can indicate muscle breakdown. Because of this and the muscle findings on her cervical MRI that we discussed, would proceed with the nerve and muscle test we talked about. Pls order EMG/NCV of right arm and leg, dx: myositis. Thanks

## 2023-06-27 ENCOUNTER — Ambulatory Visit: Payer: Medicare HMO | Admitting: Neurology

## 2023-06-30 ENCOUNTER — Telehealth: Payer: Self-pay | Admitting: Neurology

## 2023-06-30 DIAGNOSIS — Z4802 Encounter for removal of sutures: Secondary | ICD-10-CM | POA: Diagnosis not present

## 2023-06-30 DIAGNOSIS — T148XXA Other injury of unspecified body region, initial encounter: Secondary | ICD-10-CM | POA: Diagnosis not present

## 2023-06-30 NOTE — Telephone Encounter (Signed)
Pt transferred to the front to get scheduled for her EMG

## 2023-06-30 NOTE — Telephone Encounter (Signed)
Patient is calling to get status on the Nerve damage from recent MRI, Patient stated that she was suppose to receive a phone call to discuss the test

## 2023-06-30 NOTE — Telephone Encounter (Signed)
I don't see that the EMG has been done or scheduled yet, pls confirm, is she wanting to schedule it? I see it ordered, but nothing on schedule. We have already discussed her scans, next step was to do the nerve and muscle test

## 2023-07-02 NOTE — Progress Notes (Signed)
  Subjective:  Patient ID: [name], female    DOB: 1941/01/18,  MRN: [id number]  [name] presents to clinic today for at risk foot care with history of peripheral neuropathy and callus(es) right foot, porokeratotic lesion(s) left foot, and painful mycotic nails. Painful toenails interfere with ambulation. Aggravating factors include wearing enclosed shoe gear. Pain is relieved with periodic professional debridement. Painful callus(es) and porokeratotic lesion(s) are aggravated when weightbearing with and without shoegear. Pain is relieved with periodic professional debridement.  Chief Complaint  Patient presents with   RFC    RFC   Callouses    BILAT   New problem(s): None.   PCP is Thana Ates, MD.  Allergies  Allergen Reactions   Prednisone     Does not remember reaction    Sulfa Antibiotics     Does not remember reaction    Cortisone Other (See Comments)    Does not remember reaction    Sulfonamide Derivatives Other (See Comments)    Does not remember reaction     Review of Systems: Negative except as noted in the HPI.  Objective: No changes noted in today's physical examination. There were no vitals filed for this visit. [name] is a pleasant 82 y.o. female thin build in NAD. AAO x 3.  Vascular Examination: Capillary refill time immediate b/l. Vascular status intact b/l with palpable pedal pulses. Pedal hair absent b/l. No pain with calf compression b/l. Skin temperature gradient warm to cool b/l. No ischemia or gangrene noted b/l. Varicosities present b/l. Evidence of chronic venous insufficiency b/l LE.  Neurological Examination: Pt has subjective symptoms of neuropathy. Protective sensation decreased with 10 gram monofilament b/l.  Dermatological Examination:  Pedal skin thin, shiny and atrophic b/l LE. Toenails 1-5 b/l elongated, discolored, dystrophic, thickened, crumbly with subungual debris and tenderness to dorsal palpation.   Hyperkeratotic  lesion(s) submet head 1 left foot and 1st metatarsal head right foot.  No erythema, no edema, no drainage, no fluctuance.   Porokeratotic lesion(s) left 3rd toe and submet head 1 left foot. No erythema, no edema, no drainage, no fluctuance.  Musculoskeletal Examination: Muscle strength 5/5 to all lower extremity muscle groups bilaterally. HAV with bunion bilaterally and hammertoes 2-5 b/l. Utilizes cane for ambulation assistance.  Radiographs: None  Assessment/Plan: 1. Pain due to onychomycosis of toenails of both feet   2. Porokeratosis   3. Callus   4. Neuropathy     -Consent given for treatment as described below: -Examined patient. -Mycotic toenails 1-5 bilaterally were debrided in length and girth with sterile nail nippers and dremel without incident. -Callus(es) 1st metatarsal head right foot pared utilizing sterile scalpel blade without complication or incident. Total number debrided =1. -Porokeratotic lesion(s) L 3rd toe and submet head 1 left foot pared and enucleated with sterile currette without incident. Total number of lesions debrided=2. -Patient/POA to call should there be question/concern in the interim.   Return in about 3 months (around 09/22/2023).  Freddie Breech, DPM

## 2023-07-13 DIAGNOSIS — E43 Unspecified severe protein-calorie malnutrition: Secondary | ICD-10-CM | POA: Diagnosis not present

## 2023-07-13 DIAGNOSIS — G319 Degenerative disease of nervous system, unspecified: Secondary | ICD-10-CM | POA: Diagnosis not present

## 2023-07-13 DIAGNOSIS — J8410 Pulmonary fibrosis, unspecified: Secondary | ICD-10-CM | POA: Diagnosis not present

## 2023-07-13 DIAGNOSIS — R296 Repeated falls: Secondary | ICD-10-CM | POA: Diagnosis not present

## 2023-07-13 DIAGNOSIS — Z9181 History of falling: Secondary | ICD-10-CM | POA: Diagnosis not present

## 2023-07-21 ENCOUNTER — Ambulatory Visit (INDEPENDENT_AMBULATORY_CARE_PROVIDER_SITE_OTHER): Payer: Medicare HMO | Admitting: Neurology

## 2023-07-21 DIAGNOSIS — E222 Syndrome of inappropriate secretion of antidiuretic hormone: Secondary | ICD-10-CM | POA: Diagnosis not present

## 2023-07-21 DIAGNOSIS — G629 Polyneuropathy, unspecified: Secondary | ICD-10-CM

## 2023-07-21 DIAGNOSIS — J479 Bronchiectasis, uncomplicated: Secondary | ICD-10-CM | POA: Diagnosis not present

## 2023-07-21 DIAGNOSIS — M609 Myositis, unspecified: Secondary | ICD-10-CM | POA: Diagnosis not present

## 2023-07-21 DIAGNOSIS — G603 Idiopathic progressive neuropathy: Secondary | ICD-10-CM | POA: Diagnosis not present

## 2023-07-21 DIAGNOSIS — Z9181 History of falling: Secondary | ICD-10-CM | POA: Diagnosis not present

## 2023-07-21 DIAGNOSIS — E43 Unspecified severe protein-calorie malnutrition: Secondary | ICD-10-CM | POA: Diagnosis not present

## 2023-07-21 DIAGNOSIS — I700 Atherosclerosis of aorta: Secondary | ICD-10-CM | POA: Diagnosis not present

## 2023-07-21 DIAGNOSIS — Z0000 Encounter for general adult medical examination without abnormal findings: Secondary | ICD-10-CM | POA: Diagnosis not present

## 2023-07-21 DIAGNOSIS — R296 Repeated falls: Secondary | ICD-10-CM | POA: Diagnosis not present

## 2023-07-21 DIAGNOSIS — M8589 Other specified disorders of bone density and structure, multiple sites: Secondary | ICD-10-CM | POA: Diagnosis not present

## 2023-07-21 DIAGNOSIS — Z1331 Encounter for screening for depression: Secondary | ICD-10-CM | POA: Diagnosis not present

## 2023-07-21 NOTE — Procedures (Signed)
[facility] Neurology  [address] [place], [address]  [place], [place] [address] Tel: [phone] Fax: [phone] Test Date:  07/21/2023  Patient: [name] DOB: [birth year] Physician: Nita Sickle, DO  Sex: Female Height: 5\' 4"  Ref Phys: Patrcia Dolly, MD  ID#: [id number]   Technician:    History: This is a 82 year old female referred for evaluation of generalized weakness and mild hyperCKemia.  NCV & EMG Findings: Extensive electrodiagnostic testing of the right upper and lower extremity shows:  Right median, ulnar, and radial sensory responses show prolonged latency (R4.7, R4.3, R3.3 ms) with normal amplitudes.  Right sural and superficial peroneal sensory responses are absent. Right median and ulnar motor responses show prolonged latency (R4.7, R4.3 ms), reduced amplitudes (R3.0, R4.1 mV), and slowed conduction velocity (43 - 45 m/s).  Right peroneal (EDB) and tibial motor responses are absent.  Right peroneal motor response at the tibialis anterior shows reduced amplitude (R1.7 mV). Right tibial H reflex study shows prolonged latency In the right upper extremity, there is no active or chronic motor axon loss changes affecting any of the tested muscles. In the right lower extremity, chronic motor axon loss changes are seen affecting the muscles below the knee, without accompanying active denervation.  Impression: The electrophysiologic findings are consistent with a chronic sensorimotor polyneuropathy with demyelinating and axonal features affecting the right upper and lower extremities. In particular, there is no evidence of a diffuse myopathy.   ___________________________ Nita Sickle, DO    Nerve Conduction Studies   Stim Site NR Peak (ms) Norm Peak (ms) O-P Amp (V) Norm O-P Amp  Right Median Anti Sensory (2nd Digit)  32 C  Wrist    *4.7 <3.8 12.1 >10  Right Radial Anti Sensory (Base 1st Digit)  32 C  Wrist    *3.3 <2.8 16.4 >10  Right Sup Peroneal Anti Sensory (Ant  Lat Mall)  32 C  12 cm *NR  <4.6  >3  Right Sural Anti Sensory (Lat Mall)  32 C  Calf *NR  <4.6  >3  Right Ulnar Anti Sensory (5th Digit)  32 C  Wrist    *4.3 <3.2 13.4 >5     Stim Site NR Onset (ms) Norm Onset (ms) O-P Amp (mV) Norm O-P Amp Site1 Site2 Delta-0 (ms) Dist (cm) Vel (m/s) Norm Vel (m/s)  Right Median Motor (Abd Poll Brev)  32 C  Wrist    *4.7 <4.0 *3.0 >5 Elbow Wrist 6.8 29.0 *43 >50  Elbow    11.5  2.5         Right Peroneal Motor (Ext Dig Brev)  32 C  Ankle *NR  <6.0  >2.5 B Fib Ankle  0.0  >40  B Fib *NR     Poplt B Fib  0.0  >40  Poplt *NR            Right Peroneal TA Motor (Tib Ant)  32 C  Fib Head    3.7 <4.5 *1.7 >3 Poplit Fib Head 1.4 8.0 57 >40  Poplit    5.1 <5.7 1.4         Right Tibial Motor (Abd Hall Brev)  32 C  Ankle *NR  <6.0  >4 Knee Ankle  0.0  >40  Knee *NR            Right Ulnar Motor (Abd Dig Minimi)  32 C  Wrist    *4.1 <3.1 *4.1 >7 B Elbow Wrist 5.0 22.0 *44 >50  B Elbow  9.1  3.5  A Elbow B Elbow 2.2 10.0 *45 >50  A Elbow    11.3  3.3          Electromyography   Side Muscle Ins.Act Fibs Fasc Recrt Amp Dur Poly Activation Comment  Right 1stDorInt Nml Nml Nml Nml Nml Nml Nml Nml N/A  Right PronatorTeres Nml Nml Nml Nml Nml Nml Nml Nml N/A  Right Biceps Nml Nml Nml Nml Nml Nml Nml Nml *ATR  Right Triceps Nml Nml Nml Nml Nml Nml Nml Nml *ATR  Right Deltoid Nml Nml Nml Nml Nml Nml Nml Nml *ATR  Right AntTibialis Nml Nml Nml *2- *1+ *1+ *1+ Nml *ATR  Right Gastroc Nml Nml Nml *2- *1+ *1+ *1+ Nml *ATR  Right Flex Dig Long Nml Nml Nml *2- *1+ *1+ *1+ Nml N/A  Right RectFemoris Nml Nml Nml *1- Nml Nml Nml *Variable *ATR  Right BicepsFemS Nml Nml Nml Nml Nml Nml Nml Nml N/A  Right GluteusMed Nml Nml Nml Nml Nml Nml Nml Nml *ATR  Right Cervical Parasp Low Nml Nml Nml Nml Nml Nml Nml Nml *ATR      Waveforms:

## 2023-07-26 ENCOUNTER — Other Ambulatory Visit: Payer: Self-pay | Admitting: Adult Health

## 2023-08-04 ENCOUNTER — Encounter (HOSPITAL_BASED_OUTPATIENT_CLINIC_OR_DEPARTMENT_OTHER): Payer: Self-pay | Admitting: Pulmonary Disease

## 2023-08-04 ENCOUNTER — Ambulatory Visit (INDEPENDENT_AMBULATORY_CARE_PROVIDER_SITE_OTHER): Payer: Medicare HMO | Admitting: Pulmonary Disease

## 2023-08-04 VITALS — BP 140/88 | HR 72 | Ht 64.0 in | Wt 76.0 lb

## 2023-08-04 DIAGNOSIS — J471 Bronchiectasis with (acute) exacerbation: Secondary | ICD-10-CM | POA: Diagnosis not present

## 2023-08-04 MED ORDER — AZITHROMYCIN 500 MG PO TABS: 500.0000 mg | ORAL_TABLET | Freq: Every day | ORAL | 0 refills | Status: AC

## 2023-08-04 MED ORDER — LEVOFLOXACIN 250 MG PO TABS: 250.0000 mg | ORAL_TABLET | Freq: Every day | ORAL | 0 refills | Status: AC

## 2023-08-04 NOTE — Patient Instructions (Signed)
Levaquin 250 mg pill daily for 7 days.  Take zithromax 500 mg daily for 7 days 4 weeks after finishing levaquin.  Follow up in 2 months.

## 2023-08-04 NOTE — Progress Notes (Signed)
[name], [name], [name]  Chief Complaint  Patient presents with   Medical Management of Chronic Issues    Constitutional:  BP (!) 140/88   Pulse 72   Ht 5\' 4"  (1.626 m)   Wt 76 lb (34.5 kg)   SpO2 93%   BMI 13.05 kg/m   Past Medical History:  OA, Esophageal dysmotility, Nutcracker esophagus, Gastroparesis, GERD, Iron deficiency anemia, Neuropathy, Raynaud's  Past Surgical History:  She  has a past surgical history that includes Cholecystectomy (2005); Breast lumpectomy; Total abdominal hysterectomy; Lung biopsy; Cataract extraction; Colonoscopy; Upper gastrointestinal endoscopy; Esophageal manometry (N/A, 10/06/2015); 24 hour ph study (N/A, 10/13/2015); Esophageal manometry (N/A, 03/10/2018); Esophageal manometry (N/A, 10/15/2020); Esophagogastroduodenoscopy (egd) with propofol (N/A, 08/02/2022); Foreign Body Removal (08/02/2022); Elizabeth Savory dilation (N/A, 08/02/2022).  Brief Summary:  [name] is a 82 y.o. female former smoker with bronchiectasis, recurrent aspiration pneumonia with history of reflux, emphysema Elizabeth history of cryptogenic organizing pneumonia.      Subjective:   She is here with her son.  Continues to get weaker.  Has more cough with phlegm.  Has trouble bringing up sputum.  Appetite has been poor.  Having trouble with balance due to feeling weak.  Physical Exam:   Appearance - thin, frail, uses a cane  ENMT - no sinus tenderness, no oral exudate, no LAN, Mallampati 2 airway, no stridor  Respiratory - decreased breath sounds, scattered rhonchi, no wheeze  CV - s1s2 regular rate Elizabeth rhythm, no murmurs  Ext - 1+ edema  Skin - multiple areas of ecchymosis  Psych - normal mood Elizabeth affect       Collins testing:  VATS lung biopsy 2003 >> BOOP PFT 12/31/09 >> FEV1 1.84(84%), FEV1% 73, TLC 4.94(98%), DLCO 77%, +BD. PFT 06/21/16 >> FEV1 1.22 (55%), FEV1% 76, TLC 5.29 (104%), DLCO 43% PFT 12/21/16 >> FEV1 1.49 (69%),  FEV1% 78, TLC 5.42 (107%), RV 158%, DLCO 51%  Chest Imaging:  CT chest 08/17/10 >> mild biapical scarring, basilar peribronchovascular nodularity Elizabeth mild bronchiectasis HRCT chest 11/28/17 >> mild centrilobular emphysema, peribronchovascular nodularity, mild BTX, LLL consolidation CT chest 03/02/18 >> mild emphysema, areas of BTX, several nodular areas up to 1.4 cm CT chest 08/25/18 >> centrilobular emphysema, BTX with wall thickening Elizabeth tree in bud RML/lingula/bilateral lower lobes, unchanged opacity LLL, new patchy opacities LUL  HRCT chest 09/21/19 >> apical scarring, diffuse BTX with peribronchovascular nodularity, areas of mucoid impaction, coronary calcification HRCT chest 12/30/22 >> apical scarring, centrilobular emphysema, cylindrical BTX in mid Elizabeth lower lung zones with nodularity Elizabeth mucoid impaction with progression since 2020, LLL consolidation  Cardiac Tests:  Echo 10/08/19 >> EF 60 to 65%, mild LVH, grade 2 DD, mild MR, mild TR  Social History:  She  reports that she quit smoking about 54 years ago. Her smoking use included cigarettes. She started smoking about 64 years ago. She has a 2.5 pack-year smoking history. She has never used smokeless tobacco. She reports that she does not drink alcohol Elizabeth does not use drugs.  Family History:  Her family history includes Cancer in her maternal grandmother; Heart disease in her father Elizabeth paternal grandfather; Leukemia in her paternal grandmother; Lung cancer in her paternal uncle; Lymphoma in her mother.     Discussion:  She has long standing history of bronchiectasis in setting of recurrent reflux Elizabeth aspiration pneumonitis.  She has not been able to tolerate most therapies for this.  She has progressive cough, weight loss, Elizabeth  frailty.    Assessment/Plan:   Bronchiectasis, centrilobular emphysema. - flutter valve, symbicort, spiriva, albuterol, levalbuterol Elizabeth hypertonic saline caused palpitations Elizabeth dizziness - will have her  use levaquin 250 mg daily for 7 days this month, then zithromax 500 mg daily for 7 days next month for rotating antibiotic scheduled - advised her to use mucinex on regular basis - advised her it is okay to try a small amount of rye whiskey to see if this helps her cough - I don't think she could tolerate bronchoscopy given her frail state - she is reluctant to try chest vest  Peripheral neuropathy. - followed by Dr. Karel Jarvis with [facility]  Falling. - she had episode recently - discussed methods to make sure her home environment is safe - advised her to get an alert system in case she falls again   Raynaud's phenomenon. - followed by Rheumatology at [facility]   GERD with dysphagia. - she will schedule follow up with Dr. Adela Lank with [facility]   Cachexia. - discussed options to keep up with caloric Elizabeth protein intake  Time Spent Involved in Patient Collins on Day of Examination:  37 minutes  Follow up:   Patient Instructions  Levaquin 250 mg pill daily for 7 days.  Take zithromax 500 mg daily for 7 days 4 weeks after finishing levaquin.  Follow up in 2 months.  Medication List:   Allergies as of 08/04/2023       Reactions   Prednisone    Does not remember reaction    Sulfa Antibiotics    Does not remember reaction    Cortisone Other (See Comments)   Does not remember reaction    Sulfonamide Derivatives Other (See Comments)   Does not remember reaction         Medication List        Accurate as of August 04, 2023  3:00 PM. If you have any questions, ask your nurse or doctor.          acetaminophen 500 MG tablet Commonly known as: TYLENOL Take 500 mg by mouth daily as needed (pain).   azithromycin 500 MG tablet Commonly known as: Zithromax Take 1 tablet (500 mg total) by mouth daily for 7 days. Started by: [name]   benzonatate 200 MG capsule Commonly known as: TESSALON Take 1 capsule (200 mg total) by mouth 3 (three) times  daily as needed for cough.   gabapentin 250 MG/5ML solution Commonly known as: NEURONTIN Take 2mL every night   levofloxacin 250 MG tablet Commonly known as: LEVAQUIN Take 1 tablet (250 mg total) by mouth daily for 7 days. Started by: [name]   omeprazole 40 MG capsule Commonly known as: PRILOSEC TAKE ONE CAPSULE BY MOUTH TWICE DAILY   pentoxifylline 400 MG CR tablet Commonly known as: TRENTAL Take 1 tablet at night with meals   sucralfate 1 g tablet Commonly known as: CARAFATE Take 1 tablet (1 g total) by mouth every 6 (six) hours as needed. Slowly dissolve 1 tablet in 1 Tablespoon of water before ingesting   Viactiv Calcium Plus D 650-12.5-40 MG-MCG Chew Generic drug: Calcium-Vitamin D-Vitamin K Chew 1 each by mouth daily.        Signature:  [name], MD [facility] Collins/[name] Pager - [phone] 08/04/2023, 3:00 PM

## 2023-08-05 ENCOUNTER — Encounter: Payer: Self-pay | Admitting: Neurology

## 2023-08-05 ENCOUNTER — Ambulatory Visit (INDEPENDENT_AMBULATORY_CARE_PROVIDER_SITE_OTHER): Payer: Medicare HMO | Admitting: Neurology

## 2023-08-05 VITALS — BP 174/98 | HR 80 | Wt 80.2 lb

## 2023-08-05 DIAGNOSIS — R64 Cachexia: Secondary | ICD-10-CM | POA: Diagnosis not present

## 2023-08-05 DIAGNOSIS — G629 Polyneuropathy, unspecified: Secondary | ICD-10-CM

## 2023-08-05 DIAGNOSIS — R296 Repeated falls: Secondary | ICD-10-CM | POA: Diagnosis not present

## 2023-08-05 NOTE — Patient Instructions (Addendum)
Good to see you.  Referral will be sent to Palliative Care  [facility] [phone]  2. Continue all your medications  3. Continue close supervision, follow-up in 4 months, call for any changes

## 2023-08-05 NOTE — Progress Notes (Signed)
NEUROLOGY FOLLOW UP OFFICE NOTE  [name] [id number] Oct 24, 1941  HISTORY OF PRESENT ILLNESS: I had the pleasure of seeing [name] in follow-up in the neurology clinic on 08/05/2023.  The patient was last seen a month ago for an increase in falls, headaches, dizziness, confusion.  She is accompanied by her daughter [name] who helps supplement the history today.  Records and images were personally reviewed where available. Brain MRI with and without contrast done 05/2023 which did not show any acute changes. There was diffuse atrophy, chronic microvascular disease with multifocal white matter changes and a single chronic microhemorrhage in the left temporal lobe. Cervical MRI without contrast which showed diffuse edema throughout the posterior paraspinous musculature, multilevel cervical spondylosis with mild diffuse spinal stenosis at C3-4 through C5-6, moderate right C4 and C7 foraminal stenosis and moderate left C5 and C6 foraminal narrowing. CK was elevated 216, ESR, CRP, aldolase normal. She had an EMG/NCV of the right upper and lower extremities earlier this month, no evidence of a diffuse myopathy. There was again note of chronic sensorimotor polyneuropathy with demyelinating and axonal features affecting the right upper and lower extremities. She returns for follow-up to discuss results.  Notes from her other physicians were reviewed, she saw Dr. Craige Cotta yesterday for brochiectasis and was prescribed rotating antibiotics for the next 2 months. She has had progressive cough, weight loss, and frailty. Dr. Margaretann Loveless had discussed her severe protein-calorie malnutrition. Today we had an extensive discussion about her chronic medical conditions and how Palliative Care can be helpful to improve quality of life. They are happy to report that she has not had any major falls since the 2nd week of August, however she reports falling backwards 5-6 days ago after she lost balance moving back from the fridge,  her foot got stuck and she hit the counter. She notes right hand is numb. She rarely uses the Gabapentin. She still has the times where her head gets "kinda spinny." She states today is one of the worst days where she is not wanting to do anything, feeling sleepy. She now has an aide coming 2-3 days a week.    HPI: This is a pleasant 82 yo RH woman with a history of GERD, nutcracker esophagus, pulmonary fibrosis, in her usual state of health until early June 2017 while walking into a grocery she suddenly had left temporal pain, then it felt like she was hit on both side of the head with a hammer. She reports she was "hit like gangbusters," she was very dizzy with a spinning sensation, nauseated. She held on to the cart for balance and had difficulty concentrating. Her vision was blurred. She managed to finish her chore and walked to the car, then noticed that she was having a hard time talking. She could see the sentence broken up in her head and could not get the words out. She was able to drive home with no focal symptoms noted. The episode lasted 30 minutes or so, she felt weak and tired after. Since then she has had at least 2 or 3 more episodes of headaches with dizziness, which did not progress to speech difficulties. The symptoms would last around 5 minutes, no clear positional component to the dizziness. When she feels it coming on, she massages her head, which seems to help. She was unsure if Tylenol helped. She denies any prior history of headaches. She denies any vision loss, photo/phonophobia, diplopia, dysarthria, jaw claudication, hearing loss. She has occasional pulsatile tinnitus  more in the afternoons. She has GERD which makes swallowing difficult. She has a history of neuropathy affecting both feet, R>L, with burning and numbness/tingling for several years. She has been doing laser therapy for venous insufficiency/varicose veins. She denies any falls but trips more. Sleep varies. She is very  active and rides her bike for a couple of hours daily. She works at a preschool. Her mother had a stroke at age 64.   Diagnostic Data:  Venous dopplers showed chronic venous insufficiency, duplex showed limited valvular insufficiency.    EMG/NCV of the right UE and LE confirmed moderate chronic sensorimotor axonal polyneuropathy. There was also note of moderate right ulnar neuropathy with slowing across the elbow and mild right median neuropathy consistent with carpal tunnel syndrome.   EMG at [facility] showing mild sensory neuropathy and negative punch biopsy for small fiber neuropathy, no evidence of vasculitis.  She has also been evaluated by Rhuematology for Reynauds disease and by Vascular at [facility] last June 2021. Records were reviewed, she has severe venous insufficiency, mostly in the deep veins. It was discussed how this contributes to majority of her symptoms, including neuropathy. There were severe skin changes compatible with stasis dermatitis and venous stasis. She was prescribed knee high compression stockings.  PAST MEDICAL HISTORY: Past Medical History:  Diagnosis Date   Allergy    Arthritis    Atherosclerosis of aorta (HCC) 2019   ct   BOOP (bronchiolitis obliterans with organizing pneumonia) (HCC) 2003   Bronchiectasis    Cataract    Esophageal dysmotility    Esophageal ulcer    Gastroparesis    GERD (gastroesophageal reflux disease)    severe   Headache    migraines   Hiatal hernia    neuropathy right foot and leg   Iron deficiency anemia    Macular degeneration (senile) of retina    Neuropathy    Nutcracker esophagus    Osteopenia    Pneumonia    Pulmonary fibrosis (HCC)    Raynaud's disease    Recurrent pneumonia    Due to aspiration   Thrombocytosis     MEDICATIONS: Current Outpatient Medications on File Prior to Visit  Medication Sig Dispense Refill   acetaminophen (TYLENOL) 500 MG tablet Take 500 mg by mouth daily as needed (pain).      azithromycin (ZITHROMAX) 500 MG tablet Take 1 tablet (500 mg total) by mouth daily for 7 days. 7 tablet 0   benzonatate (TESSALON) 200 MG capsule Take 1 capsule (200 mg total) by mouth 3 (three) times daily as needed for cough. 90 capsule 3   Calcium-Vitamin D-Vitamin K (VIACTIV CALCIUM PLUS D) 650-12.5-40 MG-MCG-MCG CHEW Chew 1 each by mouth daily.     gabapentin (NEURONTIN) 250 MG/5ML solution Take 2mL every night 180 mL 12   levofloxacin (LEVAQUIN) 250 MG tablet Take 1 tablet (250 mg total) by mouth daily for 7 days. 7 tablet 0   omeprazole (PRILOSEC) 40 MG capsule TAKE ONE CAPSULE BY MOUTH TWICE DAILY 60 capsule 6   pentoxifylline (TRENTAL) 400 MG CR tablet Take 1 tablet at night with meals 30 tablet 6   sucralfate (CARAFATE) 1 g tablet Take 1 tablet (1 g total) by mouth every 6 (six) hours as needed. Slowly dissolve 1 tablet in 1 Tablespoon of water before ingesting 60 tablet 3   No current facility-administered medications on file prior to visit.    ALLERGIES: Allergies  Allergen Reactions   Prednisone     Does  not remember reaction    Sulfa Antibiotics     Does not remember reaction    Cortisone Other (See Comments)    Does not remember reaction    Sulfonamide Derivatives Other (See Comments)    Does not remember reaction     FAMILY HISTORY: Family History  Problem Relation Age of Onset   Lymphoma Mother    Heart disease Father    Lung cancer Paternal Uncle    Heart disease Paternal Grandfather    Leukemia Paternal Grandmother    Cancer Maternal Grandmother        unknown type   Colon cancer Neg Hx    Esophageal cancer Neg Hx    Rectal cancer Neg Hx    Stomach cancer Neg Hx     SOCIAL HISTORY: Social History   Socioeconomic History   Marital status: Widowed    Spouse name: Not on file   Number of children: 2   Years of education: Not on file   Highest education level: Not on file  Occupational History   Occupation: retired Runner, broadcasting/film/video    Comment: still teaching  pre K   Tobacco Use   Smoking status: Former    Current packs/day: 0.00    Average packs/day: 0.3 packs/day for 10.0 years (2.5 ttl pk-yrs)    Types: Cigarettes    Start date: 11/15/1958    Quit date: 11/15/1968    Years since quitting: 54.7   Smokeless tobacco: Never  Vaping Use   Vaping status: Never Used  Substance and Sexual Activity   Alcohol use: No   Drug use: No   Sexual activity: Not Currently    Partners: Male  Other Topics Concern   Not on file  Social History Narrative   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 - Retired 28yrs      Widowed      Lives alone   Lives one story townhouse      Right handed.   Social Determinants of Health   Financial Resource Strain: Not on file  Food Insecurity: Not on file  Transportation Needs: Not on file  Physical Activity: Not on file  Stress: Not on file  Social Connections: Not on file  Intimate Partner Violence: Not on file     PHYSICAL EXAM: Vitals:   08/05/23 1141  BP: (!) 174/98  Pulse: 80  SpO2: 97%   General: No acute distress, frail, sitting on wheelchair Head:  Normocephalic/atraumatic Skin/Extremities: Purplish discoloration of both feet Neurological Exam: alert and awake. No aphasia or dysarthria. Fund of knowledge is appropriate.  Attention and concentration are normal.   Cranial nerves: Pupils equal, round. Extraocular movements intact with no nystagmus. Visual fields full.  No facial asymmetry.  Motor: Bulk and tone normal, muscle strength 4+/5 throughout with no pronator drift.   Finger to nose testing intact.  Gait not tested.    IMPRESSION: This is a pleasant 82 yo RH woman with a history of GERD, nutcracker esophagus, pulmonary fibrosis, with significant neuropathy, who reported an increase in falls with dizziness, headaches, and confusion. MRI brain no acute changes. MRI cervical spine showed diffuse edema throughout the posterior paraspinous musculature, multilevel cervical  spondylosis with mild diffuse spinal stenosis at C3-4 through C5-6, moderate right C4 and C7 foraminal stenosis and moderate left C5 and C6 foraminal narrowing. CK was elevated, however her EMG did not show any evidence of myopathy. Muscle changes seen on MRI likely reaction from multilevel degenerative changes. She reports  neck pain and will continue with massage and over the counter cream, she cannot tolerate muscle relaxants. We discussed EMG/NCV showing chronic sensorimotor polyneuropathy with demyelinating and axonal features affecting the right upper and lower extremities. We discussed the option of doing a lumbar puncture to assess for CIDP and agreed at this point that we will hold off on more invasive procedures such as an LP. I had an extensive discussion about her chronic medical issues (pulmonary, cachexia, neuropathy with frequent falls) needing more help at home. We discussed referral to Palliative Care with goals of improving quality of life. She has low dose gabapentin prn for neuropathic pain. Follow-up in 4 months, call for any changes.    Thank you for allowing me to participate in her care.  Please do not hesitate to call for any questions or concerns.    Patrcia Dolly, M.D.   CC: Dr. Margaretann Loveless, Dr. Craige Cotta

## 2023-08-13 DIAGNOSIS — M25531 Pain in right wrist: Secondary | ICD-10-CM | POA: Diagnosis not present

## 2023-08-15 DIAGNOSIS — M654 Radial styloid tenosynovitis [de Quervain]: Secondary | ICD-10-CM | POA: Diagnosis not present

## 2023-08-15 DIAGNOSIS — G5601 Carpal tunnel syndrome, right upper limb: Secondary | ICD-10-CM | POA: Diagnosis not present

## 2023-08-15 DIAGNOSIS — M1811 Unilateral primary osteoarthritis of first carpometacarpal joint, right hand: Secondary | ICD-10-CM | POA: Diagnosis not present

## 2023-08-18 DIAGNOSIS — M13842 Other specified arthritis, left hand: Secondary | ICD-10-CM | POA: Diagnosis not present

## 2023-08-19 ENCOUNTER — Ambulatory Visit: Payer: Medicare HMO | Admitting: Neurology

## 2023-08-31 ENCOUNTER — Other Ambulatory Visit: Payer: Self-pay | Admitting: Gastroenterology

## 2023-09-02 ENCOUNTER — Telehealth: Payer: Self-pay | Admitting: Pulmonary Disease

## 2023-09-02 DIAGNOSIS — R053 Chronic cough: Secondary | ICD-10-CM | POA: Diagnosis not present

## 2023-09-02 NOTE — Telephone Encounter (Signed)
I called the pt and there was no answer- LMTCB. ?

## 2023-09-02 NOTE — Telephone Encounter (Signed)
Patient states having symptoms of cough, mucus and stuffy nose. No available appointments at this time. Pharmacy is [facility]. Patient phone number is [phone] and [phone].

## 2023-09-05 DIAGNOSIS — M180 Bilateral primary osteoarthritis of first carpometacarpal joints: Secondary | ICD-10-CM | POA: Diagnosis not present

## 2023-09-05 DIAGNOSIS — M79642 Pain in left hand: Secondary | ICD-10-CM | POA: Diagnosis not present

## 2023-09-06 ENCOUNTER — Telehealth: Payer: Self-pay | Admitting: Acute Care

## 2023-09-06 NOTE — Telephone Encounter (Signed)
Noted.  Will close encounter.  

## 2023-09-06 NOTE — Telephone Encounter (Signed)
Noted  

## 2023-09-06 NOTE — Telephone Encounter (Signed)
I have called the patient back after getting a sick call. Patient has bronchiectasis. She last saw Dr. Craige Cotta 08/04/2023. At that appointment he started the patient on rotational antibiotics. September she took Levaquin 250 mg for 7 days The plan was for her to start azithromycin in October 5 100 mg daily for 7 days. Patient did not start the October azithromycin dosing.  I think she did not realize that was the intent of therapy. When I discussed this with her she actually had the azithromycin at home. I have asked her to start the azithromycin, I have asked her to use her flutter valve, and restart Mucinex which she has stopped taking. She is having difficulty swallowing Mucinex tablets and I have asked her to see if she can get the liquid form at the pharmacy. She refuses to use the flutter valve and she does not want a chest vest. I offered her follow-up with [facility], however she appears first to [facility] She does get very confused regarding her medication regimen and is particular about what she will and will not do. Patient did verbalize understanding that she needs to start the azithromycin 500 mg daily for 7 days, and that she will need to take the Levaquin in November. She states that she will follow-up with [facility] so that she can see Dr. Craige Cotta as she has a long-term relationship with him.She states she will make an appointment  for follow up with him.

## 2023-09-06 NOTE — Telephone Encounter (Signed)
Called and spoke to patient. She stated that sx have been present for years but worsen at times.  C/o deep prod cough with yellow sputum, increased SOB with laying down and exertion and stuffy nose.  She does not have any inhalers, as she does not tolerate them well.  She is using tessalon TID without relief.   [name], please advise. Thanks

## 2023-09-09 DIAGNOSIS — R296 Repeated falls: Secondary | ICD-10-CM | POA: Diagnosis not present

## 2023-09-09 DIAGNOSIS — E43 Unspecified severe protein-calorie malnutrition: Secondary | ICD-10-CM | POA: Diagnosis not present

## 2023-09-09 DIAGNOSIS — G603 Idiopathic progressive neuropathy: Secondary | ICD-10-CM | POA: Diagnosis not present

## 2023-09-09 DIAGNOSIS — Z9989 Dependence on other enabling machines and devices: Secondary | ICD-10-CM | POA: Diagnosis not present

## 2023-09-09 DIAGNOSIS — Z9181 History of falling: Secondary | ICD-10-CM | POA: Diagnosis not present

## 2023-09-09 DIAGNOSIS — J8410 Pulmonary fibrosis, unspecified: Secondary | ICD-10-CM | POA: Diagnosis not present

## 2023-09-21 DIAGNOSIS — R9230 Dense breasts, unspecified: Secondary | ICD-10-CM | POA: Diagnosis not present

## 2023-09-26 ENCOUNTER — Encounter: Payer: Self-pay | Admitting: Podiatry

## 2023-09-26 ENCOUNTER — Ambulatory Visit (INDEPENDENT_AMBULATORY_CARE_PROVIDER_SITE_OTHER): Payer: Medicare HMO | Admitting: Podiatry

## 2023-09-26 ENCOUNTER — Ambulatory Visit: Payer: Medicare HMO | Admitting: Podiatry

## 2023-09-26 DIAGNOSIS — M79674 Pain in right toe(s): Secondary | ICD-10-CM

## 2023-09-26 DIAGNOSIS — M79675 Pain in left toe(s): Secondary | ICD-10-CM | POA: Diagnosis not present

## 2023-09-26 DIAGNOSIS — L84 Corns and callosities: Secondary | ICD-10-CM

## 2023-09-26 DIAGNOSIS — B351 Tinea unguium: Secondary | ICD-10-CM

## 2023-09-26 DIAGNOSIS — I739 Peripheral vascular disease, unspecified: Secondary | ICD-10-CM

## 2023-09-26 NOTE — Progress Notes (Signed)
Subjective:  Patient ID: [name], female    DOB: Apr 23, 1941,  MRN: [id number]  [name] presents to clinic today for:  Chief Complaint  Patient presents with   [facility]   Patient notes nails are thick and elongated, causing pain in shoe gear when ambulating.  She has painful corns on the distal tip of the left 3rd toe and the right bunion.  She is very particular on how she wants her nails cut and the calluses/corns shaved.   PCP is Thana Ates, MD.  Last seen 07/21/23.  Past Medical History:  Diagnosis Date   Allergy    Arthritis    Atherosclerosis of aorta (HCC) 2019   ct   BOOP (bronchiolitis obliterans with organizing pneumonia) (HCC) 2003   Bronchiectasis    Cataract    Esophageal dysmotility    Esophageal ulcer    Gastroparesis    GERD (gastroesophageal reflux disease)    severe   Headache    migraines   Hiatal hernia    neuropathy right foot and leg   Iron deficiency anemia    Macular degeneration (senile) of retina    Neuropathy    Nutcracker esophagus    Osteopenia    Pneumonia    Pulmonary fibrosis (HCC)    Raynaud's disease    Recurrent pneumonia    Due to aspiration   Thrombocytosis     Allergies  Allergen Reactions   Prednisone     Does not remember reaction    Sulfa Antibiotics     Does not remember reaction    Cortisone Other (See Comments)    Does not remember reaction    Sulfonamide Derivatives Other (See Comments)    Does not remember reaction    Objective:  CHESTER FLORIANO is a pleasant 82 y.o. female in NAD. AAO x 3.  Vascular Examination: Patient has palpable DP pulse, absent PT pulse bilateral.  Delayed capillary refill bilateral toes.  Sparse digital hair bilateral.  Proximal to distal cooling WNL bilateral.    Dermatological Examination: Interspaces are clear with no open lesions noted bilateral.  Skin is shiny and atrophic bilateral.  Nails are 3-26mm thick, with yellowish/brown discoloration, subungual debris and distal  onycholysis x10.  There is pain with compression of nails x10.  There are hyperkeratotic lesions noted distal left third toe and plantar medial aspect right bunion.  Patient qualifies for at-risk foot care because of PVD .  Assessment/Plan: 1. Pain due to onychomycosis of toenails of both feet   2. Corns   3. PVD (peripheral vascular disease) (HCC)    Mycotic nails x10 were sharply debrided with sterile nail nippers and power debriding burr to decrease bulk and length.  Hyperkeratotic lesions x2 were shaved with #312 blade.  At the end of her appointment today, she asked me to go over her nails and corns one more time with getting thickness and length down.  Informed her that this was adequately performed today and that it would not be repeated.  She apparently waited in the room for the assistant to come in and then asked for me to be sent in once again to "trim more", but she was informed this would not occur.  She will need to be rescheduled with Dr. Donzetta Matters since she does not seem satisfied with her care today.  Briefly discussed a percutaneous flexor tenotomy to the left third toe to resolve her recurring distal third toe corn.  Return in about 3 months (around 12/27/2023) for RFC with Dr. Eloy End.   Clerance Lav, DPM, FACFAS [facility]     [address] [place], [place] [address]                Office [phone]  Fax [phone]

## 2023-09-27 DIAGNOSIS — J471 Bronchiectasis with (acute) exacerbation: Secondary | ICD-10-CM | POA: Diagnosis not present

## 2023-10-04 DIAGNOSIS — J479 Bronchiectasis, uncomplicated: Secondary | ICD-10-CM | POA: Diagnosis not present

## 2023-10-17 DIAGNOSIS — Z713 Dietary counseling and surveillance: Secondary | ICD-10-CM | POA: Diagnosis not present

## 2023-10-17 DIAGNOSIS — E43 Unspecified severe protein-calorie malnutrition: Secondary | ICD-10-CM | POA: Diagnosis not present

## 2023-10-18 ENCOUNTER — Telehealth: Payer: Self-pay | Admitting: Neurology

## 2023-10-18 NOTE — Telephone Encounter (Signed)
Pt called an informed that she needs to call her PCP about sore on the rt ankle that is burning, that has stuff coming out of it and very painful.

## 2023-10-18 NOTE — Telephone Encounter (Signed)
Patient called and states that she has a sore on the rt ankle that is burning, has stuff coming out of it and very painful. She wants to speak to someone about this she wanted to come in this week to see Dr Karel Jarvis and I informed her that Dr Karel Jarvis was out of the office this week

## 2023-10-20 DIAGNOSIS — L089 Local infection of the skin and subcutaneous tissue, unspecified: Secondary | ICD-10-CM | POA: Diagnosis not present

## 2023-10-20 DIAGNOSIS — T148XXA Other injury of unspecified body region, initial encounter: Secondary | ICD-10-CM | POA: Diagnosis not present

## 2023-10-25 DIAGNOSIS — R64 Cachexia: Secondary | ICD-10-CM | POA: Diagnosis not present

## 2023-10-25 DIAGNOSIS — Z7189 Other specified counseling: Secondary | ICD-10-CM | POA: Diagnosis not present

## 2023-10-25 DIAGNOSIS — J479 Bronchiectasis, uncomplicated: Secondary | ICD-10-CM | POA: Diagnosis not present

## 2023-11-26 ENCOUNTER — Other Ambulatory Visit: Payer: Self-pay

## 2023-11-26 ENCOUNTER — Emergency Department (HOSPITAL_COMMUNITY): Payer: Medicare PPO

## 2023-11-26 ENCOUNTER — Emergency Department (HOSPITAL_COMMUNITY)
Admission: EM | Admit: 2023-11-26 | Discharge: 2023-11-26 | Disposition: A | Discharge status: Home / Self Care | Payer: Medicare PPO | Attending: Emergency Medicine | Admitting: Emergency Medicine

## 2023-11-26 ENCOUNTER — Encounter (HOSPITAL_COMMUNITY): Payer: Self-pay

## 2023-11-26 DIAGNOSIS — S81011A Laceration without foreign body, right knee, initial encounter: Secondary | ICD-10-CM | POA: Insufficient documentation

## 2023-11-26 DIAGNOSIS — E871 Hypo-osmolality and hyponatremia: Secondary | ICD-10-CM

## 2023-11-26 DIAGNOSIS — M25562 Pain in left knee: Secondary | ICD-10-CM | POA: Insufficient documentation

## 2023-11-26 DIAGNOSIS — I951 Orthostatic hypotension: Secondary | ICD-10-CM | POA: Diagnosis not present

## 2023-11-26 DIAGNOSIS — W01198A Fall on same level from slipping, tripping and stumbling with subsequent striking against other object, initial encounter: Secondary | ICD-10-CM | POA: Diagnosis not present

## 2023-11-26 DIAGNOSIS — R7401 Elevation of levels of liver transaminase levels: Secondary | ICD-10-CM | POA: Insufficient documentation

## 2023-11-26 DIAGNOSIS — S2232XA Fracture of one rib, left side, initial encounter for closed fracture: Secondary | ICD-10-CM

## 2023-11-26 DIAGNOSIS — R42 Dizziness and giddiness: Secondary | ICD-10-CM | POA: Diagnosis present

## 2023-11-26 DIAGNOSIS — S21219A Laceration without foreign body of unspecified back wall of thorax without penetration into thoracic cavity, initial encounter: Secondary | ICD-10-CM | POA: Diagnosis not present

## 2023-11-26 DIAGNOSIS — Y92003 Bedroom of unspecified non-institutional (private) residence as the place of occurrence of the external cause: Secondary | ICD-10-CM | POA: Diagnosis not present

## 2023-11-26 DIAGNOSIS — S22060A Wedge compression fracture of T7-T8 vertebra, initial encounter for closed fracture: Secondary | ICD-10-CM

## 2023-11-26 DIAGNOSIS — S81812A Laceration without foreign body, left lower leg, initial encounter: Secondary | ICD-10-CM | POA: Diagnosis not present

## 2023-11-26 DIAGNOSIS — W19XXXA Unspecified fall, initial encounter: Secondary | ICD-10-CM

## 2023-11-26 LAB — COMPREHENSIVE METABOLIC PANEL
ALT: 46 U/L — ABNORMAL HIGH (ref 0–44)
AST: 52 U/L — ABNORMAL HIGH (ref 15–41)
Albumin: 4.2 g/dL (ref 3.5–5.0)
Alkaline Phosphatase: 104 U/L (ref 38–126)
Anion gap: 8 (ref 5–15)
BUN: 11 mg/dL (ref 8–23)
CO2: 28 mmol/L (ref 22–32)
Calcium: 9.6 mg/dL (ref 8.9–10.3)
Chloride: 88 mmol/L — ABNORMAL LOW (ref 98–111)
Creatinine, Ser: <0.3 mg/dL — ABNORMAL LOW (ref 0.44–1.00)
Glucose, Bld: 109 mg/dL — ABNORMAL HIGH (ref 70–99)
Potassium: 3.8 mmol/L (ref 3.5–5.1)
Sodium: 124 mmol/L — ABNORMAL LOW (ref 135–145)
Total Bilirubin: 0.5 mg/dL (ref 0.0–1.2)
Total Protein: 8.3 g/dL — ABNORMAL HIGH (ref 6.5–8.1)

## 2023-11-26 LAB — CBC WITH DIFFERENTIAL/PLATELET
Abs Immature Granulocytes: 0.03 10*3/uL (ref 0.00–0.07)
Basophils Absolute: 0 10*3/uL (ref 0.0–0.1)
Basophils Relative: 0 %
Eosinophils Absolute: 0.1 10*3/uL (ref 0.0–0.5)
Eosinophils Relative: 1 %
HCT: 42 % (ref 36.0–46.0)
Hemoglobin: 14.1 g/dL (ref 12.0–15.0)
Immature Granulocytes: 0 %
Lymphocytes Relative: 7 %
Lymphs Abs: 0.7 10*3/uL (ref 0.7–4.0)
MCH: 31.8 pg (ref 26.0–34.0)
MCHC: 33.6 g/dL (ref 30.0–36.0)
MCV: 94.6 fL (ref 80.0–100.0)
Monocytes Absolute: 0.7 10*3/uL (ref 0.1–1.0)
Monocytes Relative: 7 %
Neutro Abs: 8.8 10*3/uL — ABNORMAL HIGH (ref 1.7–7.7)
Neutrophils Relative %: 85 %
Platelets: 384 10*3/uL (ref 150–400)
RBC: 4.44 MIL/uL (ref 3.87–5.11)
RDW: 12.5 % (ref 11.5–15.5)
WBC: 10.4 10*3/uL (ref 4.0–10.5)
nRBC: 0 % (ref 0.0–0.2)

## 2023-11-26 LAB — I-STAT CHEM 8, ED
BUN: 10 mg/dL (ref 8–23)
Calcium, Ion: 1.18 mmol/L (ref 1.15–1.40)
Chloride: 89 mmol/L — ABNORMAL LOW (ref 98–111)
Creatinine, Ser: 0.4 mg/dL — ABNORMAL LOW (ref 0.44–1.00)
Glucose, Bld: 103 mg/dL — ABNORMAL HIGH (ref 70–99)
HCT: 45 % (ref 36.0–46.0)
Hemoglobin: 15.3 g/dL — ABNORMAL HIGH (ref 12.0–15.0)
Potassium: 4.1 mmol/L (ref 3.5–5.1)
Sodium: 129 mmol/L — ABNORMAL LOW (ref 135–145)
TCO2: 29 mmol/L (ref 22–32)

## 2023-11-26 LAB — CK: Total CK: 942 U/L — ABNORMAL HIGH (ref 38–234)

## 2023-11-26 MED ORDER — IOHEXOL 300 MG/ML  SOLN
100.0000 mL | Freq: Once | INTRAMUSCULAR | Status: AC | PRN
  Administered 2023-11-26: 100 mL via INTRAVENOUS

## 2023-11-26 MED ORDER — METAMUCIL SMOOTH TEXTURE 58.6 % PO POWD: 1.0000 | Freq: Three times a day (TID) | ORAL | 12 refills | Status: DC

## 2023-11-26 MED ORDER — LIDOCAINE 5 % EX PTCH
1.0000 | MEDICATED_PATCH | Freq: Once | CUTANEOUS | Status: DC
  Administered 2023-11-26: 1 via TRANSDERMAL
  Filled 2023-11-26: qty 1

## 2023-11-26 MED ORDER — SODIUM CHLORIDE 0.9 % IV BOLUS
250.0000 mL | Freq: Once | INTRAVENOUS | Status: AC
  Administered 2023-11-26: 250 mL via INTRAVENOUS

## 2023-11-26 MED ORDER — IOHEXOL 300 MG/ML  SOLN
80.0000 mL | Freq: Once | INTRAMUSCULAR | Status: AC | PRN
  Administered 2023-11-26: 80 mL via INTRAVENOUS

## 2023-11-26 MED ORDER — ACETAMINOPHEN 500 MG PO TABS
1000.0000 mg | ORAL_TABLET | Freq: Once | ORAL | Status: AC
  Administered 2023-11-26: 1000 mg via ORAL
  Filled 2023-11-26: qty 2

## 2023-11-26 MED ORDER — OXYCODONE HCL 5 MG PO TABS
5.0000 mg | ORAL_TABLET | Freq: Once | ORAL | Status: AC
  Administered 2023-11-26: 5 mg via ORAL
  Filled 2023-11-26: qty 1

## 2023-11-26 MED ORDER — LIDOCAINE 5 % EX PTCH: 1.0000 | MEDICATED_PATCH | CUTANEOUS | 0 refills | Status: AC

## 2023-11-26 MED ORDER — SENNOSIDES-DOCUSATE SODIUM 8.6-50 MG PO TABS: 1.0000 | ORAL_TABLET | Freq: Every day | ORAL | 0 refills | Status: DC

## 2023-11-26 MED ORDER — OXYCODONE HCL 5 MG PO TABS: 5.0000 mg | ORAL_TABLET | Freq: Four times a day (QID) | ORAL | 0 refills | Status: DC | PRN

## 2023-11-26 MED ORDER — POLYETHYLENE GLYCOL 3350 17 G PO PACK: 17.0000 g | PACK | Freq: Every day | ORAL | 0 refills | Status: DC

## 2023-11-26 NOTE — Progress Notes (Signed)
 PIV cpnsult: arrived to ED for second assess. Provider in room assessing w [place]. She will let [facility] know if assistance is needed.

## 2023-11-26 NOTE — Discharge Instructions (Addendum)
 You were seen in the emergency department after your fall.  You did break 1 rib on your left side and also have a compression fracture in your mid back.  Your labs also showed that your sodium levels were low likely from some dehydration.  You should make sure that you are drinking plenty of fluids and staying well-hydrated.  You can have your sodium levels rechecked by your primary doctor within the next week.  You can take Tylenol  every 6 hours as needed for pain as well as use the lidocaine  patches.  I have given you oxycodone  for breakthrough pain.  This can make you drowsy do not take it while driving, working or operating heavy machinery.  You did appear constipated on your CAT scan and the oxycodone  can worsen constipation so you should make sure that you are taking a stool softener and Metamucil as a fiber supplement.  You can take MiraLAX  as needed for additional constipation.  We have also given you an incentive spirometer that you should use several times a day to help make sure that you are taking good deep breaths.  You should follow-up with your primary doctor to have your symptoms rechecked and can follow-up with neurosurgery as needed for your back.  You should return to the emergency department if you develop numbness or weakness in your arms or legs, you are unable to walk, you have recurrent falls with injuries or if you have any other new or concerning symptoms.

## 2023-11-26 NOTE — Progress Notes (Signed)
 [facility] ED [facility]       This patient is a current hospice patient with [facility], admitted 12.16.24 with a terminal diagnosis of severe protein calorie malnutrition.     [facility] will continue to follow for any discharge planning needs and to coordinate continuation of hospice care.    Please don't hesitate to call with any Hospice related questions or concerns.    Eleanor Nail, LPN [facility], [facility] Liaison [phone]

## 2023-11-26 NOTE — ED Provider Notes (Signed)
 [place] EMERGENCY DEPARTMENT AT [facility] Provider Note   CSN: [id number] Arrival date & time: 11/26/23  0920     History  Chief Complaint  Patient presents with   [place]    [name] is a 83 y.o. female.  Patient is an 83 year old female with a past medical history of pulmonary fibrosis and vertigo presenting to the emergency department after a fall.  Patient reports that she was going to the bathroom last night and felt dizzy similar to her previous episodes of vertigo, lost her balance and fell.  She states that she hit her head and her head was stuck between the toilet and the bathtub as well as hit her bilateral knees and her left side.  She states that she was unable to get herself up and was on the floor for about 2 hours before calling 911.  She initially did not want to come to the ER because it was cold last night but then was unable to walk this morning due to the pain in her knees so she decided to return to the ER for evaluation.  She states that she does have known arthritis in her knee however normally she is able to put weight on her knees which she was unable to today.  States that she is also having pain on her left side with some mild pain with taking deep breaths but denies any worsening shortness of breath from baseline.  She denies any numbness or weakness.  She denies any headaches, nausea or vomiting.  She denies any blood thinner use.  The history is provided by the patient.  Fall       Home Medications Prior to Admission medications   Medication Sig Start Date End Date Taking? Authorizing Provider  acetaminophen  (TYLENOL ) 500 MG tablet Take 500 mg by mouth daily as needed (pain).   Yes [provider]  benzonatate  (TESSALON ) 200 MG capsule Take 1 capsule (200 mg total) by mouth 3 (three) times daily as needed for cough. 07/27/23  Yes Parrett, Tammy S, NP  HYCODAN 5-1.5 MG/5ML syrup Take 5 mLs by mouth every 6 (six) hours as needed for  cough. 10/04/23  Yes [provider]  lidocaine  (LIDODERM ) 5 % Place 1 patch onto the skin daily. Remove & Discard patch within 12 hours or as directed by MD 11/26/23  Yes Ellouise, Dent Plantz K, DO  omeprazole  (PRILOSEC) 40 MG capsule TAKE ONE CAPSULE BY MOUTH TWICE DAILY 02/25/23  Yes Armbruster, Elspeth SQUIBB, MD  oxyCODONE  (ROXICODONE ) 5 MG immediate release tablet Take 1 tablet (5 mg total) by mouth every 6 (six) hours as needed for severe pain (pain score 7-10). 11/26/23  Yes Ellouise, Harshika Mago K, DO  polyethylene glycol (MIRALAX ) 17 g packet Take 17 g by mouth daily. 11/26/23  Yes Ellouise, Madison Albea K, DO  psyllium (METAMUCIL SMOOTH TEXTURE) 58.6 % powder Take 1 packet by mouth 3 (three) times daily. 11/26/23  Yes Ellouise, Indra Wolters K, DO  senna-docusate (SENOKOT-S) 8.6-50 MG tablet Take 1 tablet by mouth daily. 11/26/23  Yes Ellouise, Yeison Sippel K, DO  sucralfate  (CARAFATE ) 1 g tablet Take 1 tablet (1 g total) by mouth every 6 (six) hours as needed. Slowly dissolve 1 tablet in 1 Tablespoon of water before ingesting Patient taking differently: Take 1 g by mouth every 6 (six) hours as needed. 08/31/23  Yes Armbruster, Elspeth SQUIBB, MD      Allergies    Prednisone, Sulfa antibiotics, Cortisone, and Sulfonamide derivatives  Review of Systems   Review of Systems  Physical Exam Updated Vital Signs BP (!) 159/94   Pulse 71   Temp (!) 97.3 F (36.3 C) (Oral)   Resp 14   Ht 5' 4 (1.626 m)   Wt 36.3 kg   SpO2 99%   BMI 13.73 kg/m  Physical Exam Vitals and nursing note reviewed.  Constitutional:      General: She is not in acute distress.    Appearance: Normal appearance.  HENT:     Head: Normocephalic and atraumatic.     Nose: Nose normal.     Mouth/Throat:     Mouth: Mucous membranes are moist.     Pharynx: Oropharynx is clear.  Eyes:     Extraocular Movements: Extraocular movements intact.     Conjunctiva/sclera: Conjunctivae normal.  Neck:     Comments: No midline neck  tenderness Cardiovascular:     Rate and Rhythm: Normal rate and regular rhythm.     Heart sounds: Normal heart sounds.  Pulmonary:     Effort: Pulmonary effort is normal.     Breath sounds: Normal breath sounds.  Abdominal:     General: Abdomen is flat.     Palpations: Abdomen is soft.     Tenderness: There is abdominal tenderness (mild diffuse). There is no guarding or rebound.  Musculoskeletal:        General: Normal range of motion.     Cervical back: Normal range of motion and neck supple.     Comments: No midline back tenderness  Tenderness to palpation of L posterior-lateral ribs  No bony tenderness to bilateral UE  Pelvis stable, non-tender Tenderness to palpation of bilateral knees  Diffuse tenderness to palpation of R foot 1+ edema to bilateral feet  Skin:    General: Skin is warm and dry.     Comments: 2 skin tears to upper back Skin tear to R knee Skin tear to L shin  Neurological:     General: No focal deficit present.     Mental Status: She is alert and oriented to person, place, and time.     Sensory: No sensory deficit.     Motor: No weakness.  Psychiatric:        Mood and Affect: Mood normal.        Behavior: Behavior normal.     ED Results / Procedures / Treatments   Labs (all labs ordered are listed, but only abnormal results are displayed) Labs Reviewed  COMPREHENSIVE METABOLIC PANEL - Abnormal; Notable for the following components:      Result Value   Sodium 124 (*)    Chloride 88 (*)    Glucose, Bld 109 (*)    Creatinine, Ser <0.30 (*)    Total Protein 8.3 (*)    AST 52 (*)    ALT 46 (*)    All other components within normal limits  CBC WITH DIFFERENTIAL/PLATELET - Abnormal; Notable for the following components:   Neutro Abs 8.8 (*)    All other components within normal limits  CK - Abnormal; Notable for the following components:   Total CK 942 (*)    All other components within normal limits  I-STAT CHEM 8, ED - Abnormal; Notable for the  following components:   Sodium 129 (*)    Chloride 89 (*)    Creatinine, Ser 0.40 (*)    Glucose, Bld 103 (*)    Hemoglobin 15.3 (*)    All other components within normal  limits    EKG EKG Interpretation Date/Time:  Saturday November 26 2023 10:29:59 EST Ventricular Rate:  75 PR Interval:  163 QRS Duration:  82 QT Interval:  385 QTC Calculation: 430 R Axis:   71  Text Interpretation: Sinus rhythm Anteroseptal infarct, age indeterminate Artifact Otherwise no significant change Confirmed by Ellouise Fine ([id number]) on 11/26/2023 10:34:58 AM  Radiology CT CHEST ABDOMEN PELVIS W CONTRAST Result Date: 11/26/2023 CLINICAL DATA:  Polytrauma, blunt EXAM: CT CHEST, ABDOMEN, AND PELVIS WITH CONTRAST TECHNIQUE: Multidetector CT imaging of the chest, abdomen and pelvis was performed following the standard protocol during bolus administration of intravenous contrast. RADIATION DOSE REDUCTION: This exam was performed according to the departmental dose-optimization program which includes automated exposure control, adjustment of the mA and/or kV according to patient size and/or use of iterative reconstruction technique. CONTRAST:  80mL OMNIPAQUE  IOHEXOL  300 MG/ML  SOLN COMPARISON:  CT 12/29/2022, 05/18/2020 FINDINGS: CT CHEST FINDINGS Cardiovascular: Heart size is normal. No pericardial effusion. Thoracic aorta is nonaneurysmal. Scattered atherosclerotic calcification of the aorta and coronary arteries. Central pulmonary vasculature is within normal limits. Mediastinum/Nodes: No enlarged mediastinal, hilar, or axillary lymph nodes. Thyroid  gland, trachea, and esophagus demonstrate no significant findings. Lungs/Pleura: Cylindrical bronchiectasis in the mid to lower lung zones with bronchial wall thickening and peribronchovascular nodularity. Mucoid impaction most prevalent in the left lung base. No new lobar consolidation. No pleural effusion or pneumothorax. Musculoskeletal: Acute fracture of the  anterolateral left fifth rib without significant displacement. Nonacute healing right eighth, ninth, and tenth rib fractures. New mild superior endplate compression fracture of the T7 vertebral body, likely acute or subacute. Generalized cachexia. No chest wall hematoma. CT ABDOMEN PELVIS FINDINGS Hepatobiliary: No hepatic injury or perihepatic hematoma. Gallbladder is surgically absent. Similar degree of biliary dilatation, likely post surgical. Pancreas: Unremarkable. No pancreatic ductal dilatation or surrounding inflammatory changes. Spleen: No splenic injury or perisplenic hematoma. Adrenals/Urinary Tract: No adrenal hemorrhage or renal injury identified. No hydronephrosis. Bladder is unremarkable. Stomach/Bowel: Stomach is within normal limits. No evidence of bowel wall thickening, distention, or inflammatory changes. Moderate-large volume stool throughout the colon. Vascular/Lymphatic: Aortic atherosclerosis. No enlarged abdominal or pelvic lymph nodes. Reproductive: No mass or other abnormality. Other: No free air or free fluid. Musculoskeletal: No acute osseous abnormality. Chronic deformity of the distal sacrum. Thoracic vertebral body heights and alignment are maintained. No pelvic diastasis. Degenerative changes of both hips. Generalized cachexia. No soft tissue hematoma. IMPRESSION: 1. Acute fracture of the anterolateral left fifth rib. No pneumothorax. 2. New mild superior endplate compression fracture of the T7 vertebral body, likely acute or subacute. 3. Nonacute healing right eighth, ninth, and tenth rib fractures. 4. No evidence of acute traumatic injury within the abdomen or pelvis. 5. Cylindrical bronchiectasis in the mid to lower lung zones with bronchial wall thickening and peribronchovascular nodularity. Appearance is similar to prior and remains suggestive of chronic atypical infection such as Mycobacterium avium intracellulare. 6. Moderate-large volume stool throughout the colon. 7. Aortic  atherosclerosis (ICD10-I70.0). Electronically Signed   By: Mabel Converse D.O.   On: 11/26/2023 14:30   CT Cervical Spine Wo Contrast Result Date: 11/26/2023 CLINICAL DATA:  Neck trauma. Increase in falls lately. Lost balance and fell last night. EXAM: CT CERVICAL SPINE WITHOUT CONTRAST TECHNIQUE: Multidetector CT imaging of the cervical spine was performed without intravenous contrast. Multiplanar CT image reconstructions were also generated. RADIATION DOSE REDUCTION: This exam was performed according to the departmental dose-optimization program which includes automated exposure control, adjustment of the mA and/or  kV according to patient size and/or use of iterative reconstruction technique. COMPARISON:  06/23/2023 FINDINGS: Alignment: Normal. Skull base and vertebrae: No acute fracture. No primary bone lesion or focal pathologic process. Soft tissues and spinal canal: No prevertebral fluid or swelling. No visible canal hematoma. Disc levels: Degenerative changes throughout the cervical spine with narrowed interspaces and endplate osteophyte formation. Prominent degenerative changes at the C1-2 interspace. Schmorl's nodes. Vacuum changes consistent with degenerative disc disease. Upper chest: Scarring in the lung apices. Secretions are suggested in the trachea. Other: Degenerative changes in the temporomandibular joints. IMPRESSION: 1. Normal alignment.  No acute displaced fractures are identified. 2. Moderate diffuse degenerative changes. Electronically Signed   By: Elsie Gravely M.D.   On: 11/26/2023 12:06   CT Head Wo Contrast Result Date: 11/26/2023 CLINICAL DATA:  Minor head trauma. Increased in falls lately. Last night lost balance and fell. Struck head in the shower. EXAM: CT HEAD WITHOUT CONTRAST TECHNIQUE: Contiguous axial images were obtained from the base of the skull through the vertex without intravenous contrast. RADIATION DOSE REDUCTION: This exam was performed according to the  departmental dose-optimization program which includes automated exposure control, adjustment of the mA and/or kV according to patient size and/or use of iterative reconstruction technique. COMPARISON:  CT head 06/23/2023.  MRI brain 06/02/2023 FINDINGS: Brain: Diffuse cerebral atrophy. Ventricular dilatation consistent with central atrophy. Low-attenuation changes in the deep white matter consistent with small vessel ischemia. No abnormal extra-axial fluid collections. No mass effect or midline shift. Gray-white matter junctions are distinct. Basal cisterns are not effaced. No acute intracranial hemorrhage. Vascular: No hyperdense vessel or unexpected calcification. Skull: Normal. Negative for fracture or focal lesion. Sinuses/Orbits: No acute finding. Other: None. IMPRESSION: No acute intracranial abnormalities. Chronic atrophy and small vessel ischemic changes. Electronically Signed   By: Elsie Gravely M.D.   On: 11/26/2023 12:01   DG Knee Complete 4 Views Right Result Date: 11/26/2023 CLINICAL DATA:  Fall.  Pain. EXAM: RIGHT KNEE - COMPLETE 4+ VIEW COMPARISON:  None Available. FINDINGS: No evidence for an acute fracture no subluxation or dislocation. Mineralization of the mediolateral menisci evident. No substantial joint effusion. IMPRESSION: 1. No acute bony findings. 2. Chondrocalcinosis. Electronically Signed   By: Camellia Candle M.D.   On: 11/26/2023 11:38   DG Foot Complete Right Result Date: 11/26/2023 CLINICAL DATA:  Fall, pain EXAM: RIGHT FOOT COMPLETE - 3+ VIEW COMPARISON:  X-ray 11/27/2020 FINDINGS: The first MTP joint is positioned in extension on all views. No dislocation is evident. No fracture identified. Mild degenerative changes at the first MTP joint. Soft tissue swelling of the forefoot. IMPRESSION: 1. Soft tissue swelling of the forefoot. No acute fracture or dislocation. 2. The first MTP joint is positioned in extension on all views. Electronically Signed   By: Mabel Converse D.O.    On: 11/26/2023 11:38   DG Knee Complete 4 Views Left Result Date: 11/26/2023 CLINICAL DATA:  Patient fell . EXAM: LEFT KNEE - COMPLETE 4+ VIEW COMPARISON:  None Available. FINDINGS: No evidence for an acute fracture. No subluxation or dislocation. Dense meniscal calcification seen in the medial and lateral compartments. No worrisome lytic or sclerotic osseous abnormality. Small joint effusion evident. IMPRESSION: 1. No acute bony findings. 2. Small joint effusion. 3. Meniscal calcifications in the medial and lateral compartments. Electronically Signed   By: Camellia Candle M.D.   On: 11/26/2023 11:37    Procedures .Ultrasound ED Peripheral IV (Provider)  Date/Time: 11/26/2023 1:35 PM  Performed by: Ellouise Fine  K, DO Authorized by: Ellouise Richerd POUR, DO   Procedure details:    Indications: multiple failed IV attempts     Skin Prep: chlorhexidine  gluconate     Location:  Left anterior forearm   Angiocath:  20 G   Bedside Ultrasound Guided: Yes     Images: not archived     Patient tolerated procedure without complications: Yes     Dressing applied: Yes       Medications Ordered in ED Medications  lidocaine  (LIDODERM ) 5 % 1-3 patch (1 patch Transdermal Patch Applied 11/26/23 1042)  acetaminophen  (TYLENOL ) tablet 1,000 mg (1,000 mg Oral Given 11/26/23 1042)  iohexol  (OMNIPAQUE ) 300 MG/ML solution 100 mL (100 mLs Intravenous Contrast Given 11/26/23 1128)  sodium chloride  0.9 % bolus 250 mL (0 mLs Intravenous Stopped 11/26/23 1502)  iohexol  (OMNIPAQUE ) 300 MG/ML solution 80 mL (80 mLs Intravenous Contrast Given 11/26/23 1401)  oxyCODONE  (Oxy IR/ROXICODONE ) immediate release tablet 5 mg (5 mg Oral Given 11/26/23 1448)    ED Course/ Medical Decision Making/ A&P Clinical Course as of 11/26/23 1505  Sat Nov 26, 2023  1130 Mild hyponatremia, appears at baseline. [VK]  1143 No acute traumatic injury on Xrays. [VK]  1145 Mild increased CK, hyponatremia on formal CMP slightly worse from  baseline. Will be given gentle fluids, Mild transaminitis. CTAP pending. [VK]  1215 Orthostatics positive from sitting to standing. [VK]  1215 No traumatic injury on CTH/Cspine. IV infiltrated so CTAP is pending. [VK]  1458 CTAP with single L rib fracture, no pneumothorax. T7 compression fracture, constipation. Patient was able to stand and ambulate in the room. She would like to go home. Family states she is on home hospice and should be able to have close follow up. She was given incentive spirometer and pain control and given strict return precautions.  [VK]    Clinical Course User Index [VK] Kingsley, Analis Distler K, DO                                 Medical Decision Making This patient presents to the ED with chief complaint(s) of fall with pertinent past medical history of pulmonary fibrosis which further complicates the presenting complaint. The complaint involves an extensive differential diagnosis and also carries with it a high risk of complications and morbidity.    The differential diagnosis includes due to patient's age and trauma concern for ICH, mass effect, with left-sided rib tenderness, concern for blunt thoracic injury such as rib fracture, pneumothorax, hemothorax, patient's diffuse abdominal pain concerning for blunt intra-abdominal injury, possible bilateral knee fracture or dislocation, foot fracture, dizziness prior to the fall concern for near syncopal fall, arrhythmia, anemia, dehydration, electrolyte abnormality, vertigo, rhabdo  Additional history obtained: Additional history obtained from N/A Records reviewed outpatient pulmonary records  ED Course and Reassessment: On patient's arrival she is hemodynamically stable in no acute distress.  Patient will have CT pan scan performed to evaluate for traumatic injury as well as bilateral knees and right foot x-rays.  She will have EKG and labs performed to evaluate for causes of her dizziness as well as possible rhabdo with her  prolonged period of time on the floor and she will be closely reassessed.  Independent labs interpretation:  The following labs were independently interpreted: mildly worsening hyponatremia from baseline, mild transaminitis  Independent visualization of imaging: - I independently visualized the following imaging with scope of interpretation limited to determining acute life threatening  conditions related to emergency care: CTH/C-spine, CTCAP, bilateral knee Xrs, R foot XR, which revealed single L rib fracture, T7 compression fracture, constipation otherwise no other acute traumatic injury  Consultation: - Consulted or discussed management/test interpretation w/ external professional: N/A  Consideration for admission or further workup: Patient has no emergent conditions requiring admission or further work-up at this time and is stable for discharge home with primary care follow-up  Social Determinants of health: N/A    Amount and/or Complexity of Data Reviewed Labs: ordered. Radiology: ordered.  Risk OTC drugs. Prescription drug management.          Final Clinical Impression(s) / ED Diagnoses Final diagnoses:  Fall in home, initial encounter  Hyponatremia  Orthostatic hypotension  Closed fracture of one rib of left side, initial encounter  Closed wedge compression fracture of T7 vertebra, initial encounter (HCC)    Rx / DC Orders ED Discharge Orders          Ordered    oxyCODONE  (ROXICODONE ) 5 MG immediate release tablet  Every 6 hours PRN        11/26/23 1502    senna-docusate (SENOKOT-S) 8.6-50 MG tablet  Daily        11/26/23 1502    polyethylene glycol (MIRALAX ) 17 g packet  Daily        11/26/23 1502    psyllium (METAMUCIL SMOOTH TEXTURE) 58.6 % powder  3 times daily        11/26/23 1502    lidocaine  (LIDODERM ) 5 %  Every 24 hours        11/26/23 1502              Kingsley, Tyanna Hach K, DO 11/26/23 1505

## 2023-11-26 NOTE — ED Notes (Signed)
 IV has blown that was placed via ultrasound    IV has been messaged to get another one placed in order to get the CT done

## 2023-11-26 NOTE — ED Triage Notes (Addendum)
 Patient BIB [facility] from home. Increase in falls lately. Last night lost balance and fell hurting her right knee. Has skin tear on back. No blood thinners. Stated her head got wedged in between the shower last night when she fell and was on the floor for 2 hours. Today fell out of bed.

## 2023-12-05 ENCOUNTER — Ambulatory Visit: Payer: Medicare HMO | Admitting: Neurology

## 2023-12-07 ENCOUNTER — Ambulatory Visit: Payer: Medicare PPO | Admitting: Gastroenterology

## 2023-12-07 NOTE — Progress Notes (Deleted)
HPI :  Seen 10/2022:  83 year old female here for follow-up for reflux, dysphagia.  Recall she has a history of chronic lung disease with bronchiectasis, osteopenia.   See prior notes for details of her case.  Recall she has had refractory GERD. Prior work-up has shown a DeMeester score of greater than 150 during pH study.  She has had an extensive evaluation for this in recent years.  She has regurgitation, history of aspiration, we think it is related to her bronchiectasis and chronic lung disease.  She has failed maximal medical therapy.  I previously referred her to Dr. Barron Alvine for another opinion for possible TIF.  She has a Hill grade 4 view of the cardia, she also had esophageal manometry done showing mild EG J outflow obstruction.  Given these findings she was deemed to not be a candidate for TIF and was more appropriate for surgical repair of the hernia and fundoplication.  Given her lung disease, frailty, age, she has wanted to avoid surgical treatment for this.   She previously was on Dexilant 60 mg a day but unfortunately not covered by insurance so has switched to omeprazole 40 mg twice daily.  She has been taking Pepcid 20 mg twice daily as well as Carafate as needed.  Recall we tried some baclofen for her at night as well and she did not tolerate it. At her last visit she was having some worsening dysphagia that she really felt in her throat area.  She had a hard time eating and this was really impacting her quality of life.  Recall she had a barium study which showed nonspecific dysmotility and cricopharyngeal dysfunction suspected with marked reflux.  I offered her an EGD to be done at the hospital given her other medical problems, this was completed in September.  She had no obvious stenosis or stricture of her esophagus or inflammatory changes however empiric dilation was performed.  She states since the dilation has been done her symptoms are much better.  She is swallowing better,  states that dysphagia is really not bothering her right now she is very happy with how she is doing.  Further, she has been doing a bit better with her reflux symptoms on her medical regimen.  She again really wants to avoid surgery if at all possible.   Unfortunately she has had 2 falls within the past 6 weeks, she has neuropathy and at risk for falls.  She walks with a cane.  Fortunately, despite her osteopenia, she has not had any bone fractures.   Recall otherwise we had checked her labs prior to her endoscopy, she has hyponatremia that I recommended she see her primary care for, and also an elevated ALT.  She does not drink any alcohol, denies any history of liver disease.  Her liver enzymes historically have been normal.  She states she changed her primary care, does not think she has had her sodium levels followed up.   We had also discussed her bowels at the last visit, she has been using Dulcolax for constipation since have seen her and is working pretty well.     Prior work-up:  Colonoscopy 11/20/18 -  The perianal and digital rectal examinations were normal. - The terminal ileum appeared normal. - A 3 mm polyp was found in the ileocecal valve. The polyp was sessile. The polyp was removed with a cold snare. Resection and retrieval were complete. - A 3 mm polyp was found in the rectum. The polyp was  sessile. The polyp was removed with a cold snare. Resection and retrieval were complete. - The exam was otherwise without abnormality other than some tortousity. - Biopsies for histology were taken with a cold forceps from the right colon and left colon for evaluation of microscopic colitis given the patient's incidental complaints of persistent loose Stools.    -EGD 08/2015 - 1cm HH, gastritis without ulceration, HP negative -pH study done in 2016 showing Demeester score of 152.7 but only symptom index of 23% for reflux. Most of reflux was weakly acidic. 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 -EGD 07/2020-superficial ulcers in the mid esophagus (path benign), Hill grade 4, 1 cm axial by 4 cm transverse width hiatal hernia, mild gastritis -Esophageal Manometry 10/2020-normal peristalsis, normal resting pressure at EGJ with incomplete relaxation (IRP 17.6, normal <15) c/w some degree of EGJ outflow obstruction, but not achalasia   Barium study 10/06/21: IMPRESSION: 1. Tiny sliding hiatal hernia. Marked gastroesophageal reflux elicited. 2. 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 3. Evidence of mild reflux esophagitis. No evidence of esophageal mass or stricture.     EGD 08/02/2022: Esophagogastric landmarks identified. - 2 cm hiatal hernia. - Gastroesophageal flap valve classified as Hill Grade IV (no fold, wide open lumen, hiatal hernia present). - Normal esophagus otherwise - empiric dilation performed to 17mm - Normal stomach. - Duodenal foreign body. Removal was successful. - Normal duodenum otherwise      83 y.o. female here for assessment of the following   1. Gastroesophageal reflux disease, unspecified whether esophagitis present   2. Dysphagia, unspecified type   3. Long-term current use of proton pump inhibitor therapy   4. Elevated ALT measurement     As above, extensive reflux history with dysphagia.  We performed an EGD with empiric dilation at her  last exam in September and that has significantly helped her dysphagia, perhaps due to cricopharyngeal dysfunction.  Otherwise her reflux seems fairly well controlled on max medical therapy.  Recall she is not interested in pursuing surgery given her lung disease, she is very high risk.  She does have osteopenia and recurrent falls, at risk for bone fracture and she understands this however she is rather miserable if she does not take the PPI.  She understands these risks and wishes to continue high-dose PPI for now.  She will continue her regimen and follow-up with me as needed, especially if dysphagia recurs.   Otherwise reviewed her labs with her, I want to recheck her liver enzymes to make sure stable as well as her sodium levels.  She needs to follow-up with her primary care regarding hyponatremia if that persists.     PLAN: - dysphagia resolved post dilation, follow up PRN - continue omeprazole BID, pepcid, carafate. She declines surgery for reflux - management of osteopenia per PCP, understands risks of chronic PPI, fortunately no fractures in light of recent falls - lab for CMET - to recheck ALT and Na - further workup if persistent  Past Medical History:  Diagnosis Date   Allergy    Arthritis    Atherosclerosis of aorta (HCC) 2019   ct   BOOP (bronchiolitis obliterans with organizing pneumonia) (HCC) 2003   Bronchiectasis    Cataract    Esophageal dysmotility    Esophageal ulcer    Gastroparesis    GERD (gastroesophageal reflux disease)    severe   Headache    migraines   Hiatal hernia    neuropathy right foot and leg   Iron deficiency anemia    Macular degeneration (senile) of retina    Neuropathy    Nutcracker esophagus    Osteopenia    Pneumonia    Pulmonary fibrosis (HCC)    Raynaud's disease    Recurrent pneumonia    Due to aspiration   Thrombocytosis      Past Surgical History:  Procedure Laterality Date   79 HOUR PH STUDY N/A 10/13/2015   Procedure: 24  HOUR PH STUDY;  Surgeon: Iva Boop, MD;  Location: [facility];  Service: Endoscopy;  Laterality: N/A;   BREAST LUMPECTOMY     left, x 2   CATARACT EXTRACTION     both eyes   CHOLECYSTECTOMY  2005   COLONOSCOPY     ESOPHAGEAL MANOMETRY N/A 10/06/2015   Procedure: ESOPHAGEAL MANOMETRY (EM);  Surgeon: Ruffin Frederick, MD;  Location: [facility];  Service: Gastroenterology;  Laterality: N/A;   ESOPHAGEAL MANOMETRY N/A 03/10/2018   Procedure: ESOPHAGEAL MANOMETRY (EM);  Surgeon: Napoleon Form, MD;  Location: [facility];  Service: Endoscopy;  Laterality: N/A;   ESOPHAGEAL MANOMETRY N/A 10/15/2020   Procedure: ESOPHAGEAL MANOMETRY (EM);  Surgeon: Shellia Cleverly, DO;  Location: [facility];  Service: Gastroenterology;  Laterality: N/A;   ESOPHAGOGASTRODUODENOSCOPY (EGD) WITH PROPOFOL N/A 08/02/2022   Procedure: ESOPHAGOGASTRODUODENOSCOPY (EGD) WITH PROPOFOL;  Surgeon: Benancio Deeds, MD;  Location: [facility];  Service: Gastroenterology;  Laterality: N/A;   FOREIGN BODY REMOVAL  08/02/2022   Procedure: FOREIGN BODY REMOVAL;  Surgeon: Benancio Deeds, MD;  Location: [facility];  Service: Gastroenterology;;   LUNG BIOPSY     vats right 2003   SAVORY DILATION N/A 08/02/2022   Procedure: SAVORY DILATION;  Surgeon: Benancio Deeds, MD;  Location: [facility];  Service: Gastroenterology;  Laterality: N/A;   TOTAL ABDOMINAL HYSTERECTOMY     UPPER GASTROINTESTINAL ENDOSCOPY     Family History  Problem Relation Age of Onset   Lymphoma Mother    Heart disease Father    Lung cancer Paternal Uncle    Heart disease Paternal Grandfather    Leukemia Paternal Grandmother    Cancer Maternal Grandmother        unknown type   Colon cancer Neg Hx    Esophageal cancer Neg Hx    Rectal cancer Neg Hx    Stomach cancer Neg Hx    Social History   Tobacco Use   Smoking status: Former    Current packs/day: 0.00    Average packs/day: 0.3 packs/day for 10.0 years  (2.5 ttl pk-yrs)    Types: Cigarettes    Start date: 11/15/1958    Quit date: 11/15/1968    Years since quitting: 55.0   Smokeless tobacco: Never  Vaping Use   Vaping status: Never Used  Substance Use Topics   Alcohol use: No   Drug use: No   Current Outpatient Medications  Medication Sig Dispense Refill   acetaminophen (TYLENOL) 500 MG tablet Take 500 mg by mouth daily as  needed (pain).     benzonatate (TESSALON) 200 MG capsule Take 1 capsule (200 mg total) by mouth 3 (three) times daily as needed for cough. 90 capsule 3   HYCODAN 5-1.5 MG/5ML syrup Take 5 mLs by mouth every 6 (six) hours as needed for cough.     lidocaine (LIDODERM) 5 % Place 1 patch onto the skin daily. Remove & Discard patch within 12 hours or as directed by MD 30 patch 0   omeprazole (PRILOSEC) 40 MG capsule TAKE ONE CAPSULE BY MOUTH TWICE DAILY 60 capsule 6   oxyCODONE (ROXICODONE) 5 MG immediate release tablet Take 1 tablet (5 mg total) by mouth every 6 (six) hours as needed for severe pain (pain score 7-10). 12 tablet 0   polyethylene glycol (MIRALAX) 17 g packet Take 17 g by mouth daily. 14 each 0   psyllium (METAMUCIL SMOOTH TEXTURE) 58.6 % powder Take 1 packet by mouth 3 (three) times daily. 283 g 12   senna-docusate (SENOKOT-S) 8.6-50 MG tablet Take 1 tablet by mouth daily. 30 tablet 0   sucralfate (CARAFATE) 1 g tablet Take 1 tablet (1 g total) by mouth every 6 (six) hours as needed. Slowly dissolve 1 tablet in 1 Tablespoon of water before ingesting (Patient taking differently: Take 1 g by mouth every 6 (six) hours as needed.) 60 tablet 3   No current facility-administered medications for this visit.   Allergies  Allergen Reactions   Prednisone     Does not remember reaction    Sulfa Antibiotics     Does not remember reaction    Cortisone Other (See Comments)    Does not remember reaction    Sulfonamide Derivatives Other (See Comments)    Does not remember reaction      Review of Systems: All systems  reviewed and negative except where noted in HPI.    CT CHEST ABDOMEN PELVIS W CONTRAST Result Date: 11/26/2023 CLINICAL DATA:  Polytrauma, blunt EXAM: CT CHEST, ABDOMEN, AND PELVIS WITH CONTRAST TECHNIQUE: Multidetector CT imaging of the chest, abdomen and pelvis was performed following the standard protocol during bolus administration of intravenous contrast. RADIATION DOSE REDUCTION: This exam was performed according to the departmental dose-optimization program which includes automated exposure control, adjustment of the mA and/or kV according to patient size and/or use of iterative reconstruction technique. CONTRAST:  80mL OMNIPAQUE IOHEXOL 300 MG/ML  SOLN COMPARISON:  CT 12/29/2022, 05/18/2020 FINDINGS: CT CHEST FINDINGS Cardiovascular: Heart size is normal. No pericardial effusion. Thoracic aorta is nonaneurysmal. Scattered atherosclerotic calcification of the aorta and coronary arteries. Central pulmonary vasculature is within normal limits. Mediastinum/Nodes: No enlarged mediastinal, hilar, or axillary lymph nodes. Thyroid gland, trachea, and esophagus demonstrate no significant findings. Lungs/Pleura: Cylindrical bronchiectasis in the mid to lower lung zones with bronchial wall thickening and peribronchovascular nodularity. Mucoid impaction most prevalent in the left lung base. No new lobar consolidation. No pleural effusion or pneumothorax. Musculoskeletal: Acute fracture of the anterolateral left fifth rib without significant displacement. Nonacute healing right eighth, ninth, and tenth rib fractures. New mild superior endplate compression fracture of the T7 vertebral body, likely acute or subacute. Generalized cachexia. No chest wall hematoma. CT ABDOMEN PELVIS FINDINGS Hepatobiliary: No hepatic injury or perihepatic hematoma. Gallbladder is surgically absent. Similar degree of biliary dilatation, likely post surgical. Pancreas: Unremarkable. No pancreatic ductal dilatation or surrounding  inflammatory changes. Spleen: No splenic injury or perisplenic hematoma. Adrenals/Urinary Tract: No adrenal hemorrhage or renal injury identified. No hydronephrosis. Bladder is unremarkable. Stomach/Bowel: Stomach is within normal  limits. No evidence of bowel wall thickening, distention, or inflammatory changes. Moderate-large volume stool throughout the colon. Vascular/Lymphatic: Aortic atherosclerosis. No enlarged abdominal or pelvic lymph nodes. Reproductive: No mass or other abnormality. Other: No free air or free fluid. Musculoskeletal: No acute osseous abnormality. Chronic deformity of the distal sacrum. Thoracic vertebral body heights and alignment are maintained. No pelvic diastasis. Degenerative changes of both hips. Generalized cachexia. No soft tissue hematoma. IMPRESSION: 1. Acute fracture of the anterolateral left fifth rib. No pneumothorax. 2. New mild superior endplate compression fracture of the T7 vertebral body, likely acute or subacute. 3. Nonacute healing right eighth, ninth, and tenth rib fractures. 4. No evidence of acute traumatic injury within the abdomen or pelvis. 5. Cylindrical bronchiectasis in the mid to lower lung zones with bronchial wall thickening and peribronchovascular nodularity. Appearance is similar to prior and remains suggestive of chronic atypical infection such as Mycobacterium avium intracellulare. 6. Moderate-large volume stool throughout the colon. 7. Aortic atherosclerosis (ICD10-I70.0). Electronically Signed   By: Duanne Guess D.O.   On: 11/26/2023 14:30   CT Cervical Spine Wo Contrast Result Date: 11/26/2023 CLINICAL DATA:  Neck trauma. Increase in falls lately. Lost balance and fell last night. EXAM: CT CERVICAL SPINE WITHOUT CONTRAST TECHNIQUE: Multidetector CT imaging of the cervical spine was performed without intravenous contrast. Multiplanar CT image reconstructions were also generated. RADIATION DOSE REDUCTION: This exam was performed according to the  departmental dose-optimization program which includes automated exposure control, adjustment of the mA and/or kV according to patient size and/or use of iterative reconstruction technique. COMPARISON:  06/23/2023 FINDINGS: Alignment: Normal. Skull base and vertebrae: No acute fracture. No primary bone lesion or focal pathologic process. Soft tissues and spinal canal: No prevertebral fluid or swelling. No visible canal hematoma. Disc levels: Degenerative changes throughout the cervical spine with narrowed interspaces and endplate osteophyte formation. Prominent degenerative changes at the C1-2 interspace. Schmorl's nodes. Vacuum changes consistent with degenerative disc disease. Upper chest: Scarring in the lung apices. Secretions are suggested in the trachea. Other: Degenerative changes in the temporomandibular joints. IMPRESSION: 1. Normal alignment.  No acute displaced fractures are identified. 2. Moderate diffuse degenerative changes. Electronically Signed   By: Burman Nieves M.D.   On: 11/26/2023 12:06   CT Head Wo Contrast Result Date: 11/26/2023 CLINICAL DATA:  Minor head trauma. Increased in falls lately. Last night lost balance and fell. Struck head in the shower. EXAM: CT HEAD WITHOUT CONTRAST TECHNIQUE: Contiguous axial images were obtained from the base of the skull through the vertex without intravenous contrast. RADIATION DOSE REDUCTION: This exam was performed according to the departmental dose-optimization program which includes automated exposure control, adjustment of the mA and/or kV according to patient size and/or use of iterative reconstruction technique. COMPARISON:  CT head 06/23/2023.  MRI brain 06/02/2023 FINDINGS: Brain: Diffuse cerebral atrophy. Ventricular dilatation consistent with central atrophy. Low-attenuation changes in the deep white matter consistent with small vessel ischemia. No abnormal extra-axial fluid collections. No mass effect or midline shift. Gray-white matter  junctions are distinct. Basal cisterns are not effaced. No acute intracranial hemorrhage. Vascular: No hyperdense vessel or unexpected calcification. Skull: Normal. Negative for fracture or focal lesion. Sinuses/Orbits: No acute finding. Other: None. IMPRESSION: No acute intracranial abnormalities. Chronic atrophy and small vessel ischemic changes. Electronically Signed   By: Burman Nieves M.D.   On: 11/26/2023 12:01   DG Knee Complete 4 Views Right Result Date: 11/26/2023 CLINICAL DATA:  Fall.  Pain. EXAM: RIGHT KNEE - COMPLETE 4+ VIEW  COMPARISON:  None Available. FINDINGS: No evidence for an acute fracture no subluxation or dislocation. Mineralization of the mediolateral menisci evident. No substantial joint effusion. IMPRESSION: 1. No acute bony findings. 2. Chondrocalcinosis. Electronically Signed   By: Kennith Center M.D.   On: 11/26/2023 11:38   DG Foot Complete Right Result Date: 11/26/2023 CLINICAL DATA:  Fall, pain EXAM: RIGHT FOOT COMPLETE - 3+ VIEW COMPARISON:  X-ray 11/27/2020 FINDINGS: The first MTP joint is positioned in extension on all views. No dislocation is evident. No fracture identified. Mild degenerative changes at the first MTP joint. Soft tissue swelling of the forefoot. IMPRESSION: 1. Soft tissue swelling of the forefoot. No acute fracture or dislocation. 2. The first MTP joint is positioned in extension on all views. Electronically Signed   By: Duanne Guess D.O.   On: 11/26/2023 11:38   DG Knee Complete 4 Views Left Result Date: 11/26/2023 CLINICAL DATA:  Patient fell . EXAM: LEFT KNEE - COMPLETE 4+ VIEW COMPARISON:  None Available. FINDINGS: No evidence for an acute fracture. No subluxation or dislocation. Dense meniscal calcification seen in the medial and lateral compartments. No worrisome lytic or sclerotic osseous abnormality. Small joint effusion evident. IMPRESSION: 1. No acute bony findings. 2. Small joint effusion. 3. Meniscal calcifications in the medial and lateral  compartments. Electronically Signed   By: Kennith Center M.D.   On: 11/26/2023 11:37    Physical Exam: There were no vitals taken for this visit. Constitutional: Pleasant,well-developed, ***female in no acute distress. HEENT: Normocephalic and atraumatic. Conjunctivae are normal. No scleral icterus. Neck supple.  Cardiovascular: Normal rate, regular rhythm.  Pulmonary/chest: Effort normal and breath sounds normal. No wheezing, rales or rhonchi. Abdominal: Soft, nondistended, nontender. Bowel sounds active throughout. There are no masses palpable. No hepatomegaly. Extremities: no edema Lymphadenopathy: No cervical adenopathy noted. Neurological: Alert and oriented to person place and time. Skin: Skin is warm and dry. No rashes noted. Psychiatric: Normal mood and affect. Behavior is normal.   ASSESSMENT: 83 y.o. female here for assessment of the following  No diagnosis found.  PLAN:   Thana Ates, MD

## 2023-12-16 NOTE — Progress Notes (Unsigned)
 [name]

## 2023-12-19 ENCOUNTER — Ambulatory Visit: Payer: Medicare PPO | Admitting: Gastroenterology

## 2023-12-27 ENCOUNTER — Encounter: Payer: Self-pay | Admitting: Gastroenterology

## 2023-12-27 ENCOUNTER — Ambulatory Visit (INDEPENDENT_AMBULATORY_CARE_PROVIDER_SITE_OTHER): Payer: Medicare HMO | Admitting: Podiatry

## 2023-12-27 DIAGNOSIS — Z91199 Patient's noncompliance with other medical treatment and regimen due to unspecified reason: Secondary | ICD-10-CM

## 2023-12-30 NOTE — Progress Notes (Signed)
1. No-show for appointment

## 2024-01-14 ENCOUNTER — Inpatient Hospital Stay (HOSPITAL_COMMUNITY)

## 2024-01-14 ENCOUNTER — Other Ambulatory Visit: Payer: Self-pay

## 2024-01-14 ENCOUNTER — Inpatient Hospital Stay (HOSPITAL_COMMUNITY)
Admission: EM | Admit: 2024-01-14 | Discharge: 2024-01-18 | DRG: 521 | Disposition: A | Discharge status: Skilled Nursing Facility | Attending: Internal Medicine | Admitting: Internal Medicine

## 2024-01-14 ENCOUNTER — Emergency Department (HOSPITAL_COMMUNITY)

## 2024-01-14 ENCOUNTER — Encounter (HOSPITAL_COMMUNITY): Payer: Self-pay | Admitting: Family Medicine

## 2024-01-14 DIAGNOSIS — J8410 Pulmonary fibrosis, unspecified: Secondary | ICD-10-CM | POA: Diagnosis present

## 2024-01-14 DIAGNOSIS — S40812A Abrasion of left upper arm, initial encounter: Secondary | ICD-10-CM | POA: Diagnosis present

## 2024-01-14 DIAGNOSIS — M8580 Other specified disorders of bone density and structure, unspecified site: Secondary | ICD-10-CM | POA: Diagnosis present

## 2024-01-14 DIAGNOSIS — E876 Hypokalemia: Secondary | ICD-10-CM | POA: Diagnosis not present

## 2024-01-14 DIAGNOSIS — I10 Essential (primary) hypertension: Secondary | ICD-10-CM | POA: Diagnosis present

## 2024-01-14 DIAGNOSIS — E43 Unspecified severe protein-calorie malnutrition: Secondary | ICD-10-CM | POA: Diagnosis present

## 2024-01-14 DIAGNOSIS — Z87891 Personal history of nicotine dependence: Secondary | ICD-10-CM | POA: Diagnosis not present

## 2024-01-14 DIAGNOSIS — J479 Bronchiectasis, uncomplicated: Secondary | ICD-10-CM

## 2024-01-14 DIAGNOSIS — S0083XA Contusion of other part of head, initial encounter: Secondary | ICD-10-CM | POA: Diagnosis present

## 2024-01-14 DIAGNOSIS — Z90710 Acquired absence of both cervix and uterus: Secondary | ICD-10-CM

## 2024-01-14 DIAGNOSIS — Z8659 Personal history of other mental and behavioral disorders: Secondary | ICD-10-CM

## 2024-01-14 DIAGNOSIS — Z806 Family history of leukemia: Secondary | ICD-10-CM

## 2024-01-14 DIAGNOSIS — W1809XA Striking against other object with subsequent fall, initial encounter: Secondary | ICD-10-CM | POA: Diagnosis present

## 2024-01-14 DIAGNOSIS — Z885 Allergy status to narcotic agent status: Secondary | ICD-10-CM | POA: Diagnosis not present

## 2024-01-14 DIAGNOSIS — Z681 Body mass index (BMI) 19 or less, adult: Secondary | ICD-10-CM

## 2024-01-14 DIAGNOSIS — Z8673 Personal history of transient ischemic attack (TIA), and cerebral infarction without residual deficits: Secondary | ICD-10-CM

## 2024-01-14 DIAGNOSIS — Z807 Family history of other malignant neoplasms of lymphoid, hematopoietic and related tissues: Secondary | ICD-10-CM

## 2024-01-14 DIAGNOSIS — D509 Iron deficiency anemia, unspecified: Secondary | ICD-10-CM | POA: Diagnosis present

## 2024-01-14 DIAGNOSIS — S72012A Unspecified intracapsular fracture of left femur, initial encounter for closed fracture: Secondary | ICD-10-CM | POA: Diagnosis present

## 2024-01-14 DIAGNOSIS — S728X2A Other fracture of left femur, initial encounter for closed fracture: Principal | ICD-10-CM

## 2024-01-14 DIAGNOSIS — S72002A Fracture of unspecified part of neck of left femur, initial encounter for closed fracture: Secondary | ICD-10-CM | POA: Diagnosis present

## 2024-01-14 DIAGNOSIS — Z515 Encounter for palliative care: Secondary | ICD-10-CM

## 2024-01-14 DIAGNOSIS — E222 Syndrome of inappropriate secretion of antidiuretic hormone: Secondary | ICD-10-CM | POA: Diagnosis present

## 2024-01-14 DIAGNOSIS — Z801 Family history of malignant neoplasm of trachea, bronchus and lung: Secondary | ICD-10-CM

## 2024-01-14 DIAGNOSIS — F509 Eating disorder, unspecified: Secondary | ICD-10-CM

## 2024-01-14 DIAGNOSIS — Z8701 Personal history of pneumonia (recurrent): Secondary | ICD-10-CM

## 2024-01-14 DIAGNOSIS — Z8249 Family history of ischemic heart disease and other diseases of the circulatory system: Secondary | ICD-10-CM | POA: Diagnosis not present

## 2024-01-14 DIAGNOSIS — R54 Age-related physical debility: Secondary | ICD-10-CM | POA: Diagnosis present

## 2024-01-14 DIAGNOSIS — K3184 Gastroparesis: Secondary | ICD-10-CM | POA: Diagnosis present

## 2024-01-14 DIAGNOSIS — G4730 Sleep apnea, unspecified: Secondary | ICD-10-CM | POA: Diagnosis not present

## 2024-01-14 DIAGNOSIS — Z66 Do not resuscitate: Secondary | ICD-10-CM | POA: Diagnosis present

## 2024-01-14 DIAGNOSIS — Z9049 Acquired absence of other specified parts of digestive tract: Secondary | ICD-10-CM

## 2024-01-14 DIAGNOSIS — Z888 Allergy status to other drugs, medicaments and biological substances status: Secondary | ICD-10-CM | POA: Diagnosis not present

## 2024-01-14 DIAGNOSIS — Z882 Allergy status to sulfonamides status: Secondary | ICD-10-CM | POA: Diagnosis not present

## 2024-01-14 DIAGNOSIS — K219 Gastro-esophageal reflux disease without esophagitis: Secondary | ICD-10-CM | POA: Diagnosis present

## 2024-01-14 DIAGNOSIS — R627 Adult failure to thrive: Secondary | ICD-10-CM | POA: Diagnosis present

## 2024-01-14 DIAGNOSIS — Z8719 Personal history of other diseases of the digestive system: Secondary | ICD-10-CM

## 2024-01-14 LAB — URINALYSIS, ROUTINE W REFLEX MICROSCOPIC
Bilirubin Urine: NEGATIVE
Glucose, UA: NEGATIVE mg/dL
Hgb urine dipstick: NEGATIVE
Ketones, ur: NEGATIVE mg/dL
Leukocytes,Ua: NEGATIVE
Nitrite: NEGATIVE
Protein, ur: NEGATIVE mg/dL
Specific Gravity, Urine: 1.005 (ref 1.005–1.030)
pH: 8 (ref 5.0–8.0)

## 2024-01-14 LAB — BASIC METABOLIC PANEL
Anion gap: 9 (ref 5–15)
BUN: 8 mg/dL (ref 8–23)
CO2: 27 mmol/L (ref 22–32)
Calcium: 9.1 mg/dL (ref 8.9–10.3)
Chloride: 91 mmol/L — ABNORMAL LOW (ref 98–111)
Creatinine, Ser: 0.35 mg/dL — ABNORMAL LOW (ref 0.44–1.00)
GFR, Estimated: >60 mL/min (ref >60)
Glucose, Bld: 98 mg/dL (ref 70–99)
Potassium: 4 mmol/L (ref 3.5–5.1)
Sodium: 127 mmol/L — ABNORMAL LOW (ref 135–145)

## 2024-01-14 LAB — PROTIME-INR
INR: 1 (ref 0.8–1.2)
Prothrombin Time: 13.1 s (ref 11.4–15.2)

## 2024-01-14 LAB — CBC
HCT: 37.1 % (ref 36.0–46.0)
Hemoglobin: 12.2 g/dL (ref 12.0–15.0)
MCH: 29.4 pg (ref 26.0–34.0)
MCHC: 32.9 g/dL (ref 30.0–36.0)
MCV: 89.4 fL (ref 80.0–100.0)
Platelets: 378 10*3/uL (ref 150–400)
RBC: 4.15 MIL/uL (ref 3.87–5.11)
RDW: 12.2 % (ref 11.5–15.5)
WBC: 9.3 10*3/uL (ref 4.0–10.5)
nRBC: 0 % (ref 0.0–0.2)

## 2024-01-14 LAB — TYPE AND SCREEN
ABO/RH(D): A POS
Antibody Screen: NEGATIVE

## 2024-01-14 LAB — ABO/RH: ABO/RH(D): A POS

## 2024-01-14 MED ORDER — OXYCODONE HCL 5 MG PO TABS: 5.0000 mg | ORAL_TABLET | ORAL | Status: DC | PRN

## 2024-01-14 MED ORDER — PANTOPRAZOLE SODIUM 40 MG PO TBEC
40.0000 mg | DELAYED_RELEASE_TABLET | Freq: Every day | ORAL | Status: DC
  Administered 2024-01-16 – 2024-01-18 (×3): 40 mg via ORAL
  Filled 2024-01-14 (×4): qty 1

## 2024-01-14 MED ORDER — KETOROLAC TROMETHAMINE 30 MG/ML IJ SOLN
30.0000 mg | Freq: Once | INTRAMUSCULAR | Status: AC
  Administered 2024-01-14: 30 mg via INTRAVENOUS
  Filled 2024-01-14: qty 1

## 2024-01-14 MED ORDER — CHLORHEXIDINE GLUCONATE 4 % EX SOLN
60.0000 mL | Freq: Once | CUTANEOUS | Status: AC
  Administered 2024-01-15: 4 via TOPICAL
  Filled 2024-01-14: qty 60

## 2024-01-14 MED ORDER — SUCRALFATE 1 G PO TABS: 1.0000 g | ORAL_TABLET | ORAL | Status: DC

## 2024-01-14 MED ORDER — HYDROMORPHONE HCL 1 MG/ML IJ SOLN
0.5000 mg | Freq: Once | INTRAMUSCULAR | Status: AC
  Administered 2024-01-14: 0.5 mg via INTRAVENOUS
  Filled 2024-01-14: qty 1

## 2024-01-14 MED ORDER — METHOCARBAMOL 500 MG PO TABS
500.0000 mg | ORAL_TABLET | Freq: Four times a day (QID) | ORAL | Status: DC | PRN
  Administered 2024-01-14: 500 mg via ORAL
  Filled 2024-01-14: qty 1

## 2024-01-14 MED ORDER — BISACODYL 5 MG PO TBEC: 5.0000 mg | DELAYED_RELEASE_TABLET | Freq: Every day | ORAL | Status: DC

## 2024-01-14 MED ORDER — METHOCARBAMOL 1000 MG/10ML IJ SOLN: 500.0000 mg | Freq: Four times a day (QID) | INTRAMUSCULAR | Status: DC | PRN

## 2024-01-14 MED ORDER — FENTANYL CITRATE PF 50 MCG/ML IJ SOSY
25.0000 ug | PREFILLED_SYRINGE | Freq: Once | INTRAMUSCULAR | Status: AC
  Administered 2024-01-14: 25 ug via INTRAVENOUS
  Filled 2024-01-14: qty 1

## 2024-01-14 MED ORDER — MORPHINE SULFATE (PF) 2 MG/ML IV SOLN
0.5000 mg | INTRAVENOUS | Status: DC | PRN
  Administered 2024-01-14 – 2024-01-15 (×2): 0.5 mg via INTRAVENOUS
  Filled 2024-01-14 (×2): qty 1

## 2024-01-14 MED ORDER — ONDANSETRON HCL 4 MG/2ML IJ SOLN
4.0000 mg | Freq: Once | INTRAMUSCULAR | Status: AC
  Administered 2024-01-14: 4 mg via INTRAVENOUS
  Filled 2024-01-14: qty 2

## 2024-01-14 MED ORDER — LORAZEPAM 0.5 MG PO TABS
0.5000 mg | ORAL_TABLET | Freq: Four times a day (QID) | ORAL | Status: DC | PRN
  Administered 2024-01-14 – 2024-01-18 (×5): 0.5 mg via ORAL
  Filled 2024-01-14 (×5): qty 1

## 2024-01-14 MED ORDER — SODIUM CHLORIDE 0.9 % IV BOLUS
1000.0000 mL | Freq: Once | INTRAVENOUS | Status: AC
  Administered 2024-01-14: 1000 mL via INTRAVENOUS

## 2024-01-14 MED ORDER — ENSURE PRE-SURGERY PO LIQD
296.0000 mL | Freq: Once | ORAL | Status: AC
  Administered 2024-01-15: 296 mL via ORAL
  Filled 2024-01-14: qty 296

## 2024-01-14 MED ORDER — METOPROLOL TARTRATE 5 MG/5ML IV SOLN: 2.5000 mg | Freq: Three times a day (TID) | INTRAVENOUS | Status: DC | PRN

## 2024-01-14 MED ORDER — AMOXICILLIN-POT CLAVULANATE 875-125 MG PO TABS: 1.0000 | ORAL_TABLET | Freq: Two times a day (BID) | ORAL | Status: DC

## 2024-01-14 NOTE — Progress Notes (Signed)
 [name] [phone] [facility] Liaison Note:  This patient is?a current hospice patient with [facility], admitted with a terminal diagnosis of Severe Protein-Calorie Malnutrition.  ?   Patient is a DNR and [name] (dtr) [phone] is the patient's primary CG.   We will continue to follow for any discharge planning needs and to coordinate continuation of?hospice care.?   Please don't hesitate to call with any Hospice related questions or concerns.    Thank you for the opportunity to participate in this patient's care.   Roe Rutherford, BSN, RN Hospice Nurse Liaison [phone]

## 2024-01-14 NOTE — Plan of Care (Signed)
   Problem: Coping: Goal: Level of anxiety will decrease Outcome: Progressing   Problem: Pain Managment: Goal: General experience of comfort will improve and/or be controlled Outcome: Progressing   Problem: Safety: Goal: Ability to remain free from injury will improve Outcome: Progressing

## 2024-01-14 NOTE — H&P (View-Only) (Signed)
 ORTHOPAEDIC CONSULTATION  REQUESTING PHYSICIAN: Orland Mustard, MD  PCP:  System, Provider Not In  Chief Complaint: Left hip injury  HPI: [name] is a 83 y.o. female with multiple medical problems on home hospice for failure to thrive.  She had a mechanical ground-level fall earlier today, and injured her left hip.  She had immediate left hip pain and inability to weight-bear.  She was brought to the emergency department at [facility], where x-rays revealed a left femoral neck fracture.  Orthopedic consultation was placed for management of her left hip fracture.  [facility] has been consulted for admission.  She has skin tears to the left upper and left lower extremity that are already dressed.  Patient lives in a condo.  She ambulates with a walker.  She did not have any hip pain preceding this injury.  She has a 24-hour caregiver.  Past Medical History:  Diagnosis Date   Allergy    Arthritis    Atherosclerosis of aorta (HCC) 2019   ct   BOOP (bronchiolitis obliterans with organizing pneumonia) (HCC) 2003   Bronchiectasis    Cataract    Esophageal dysmotility    Esophageal ulcer    Gastroparesis    GERD (gastroesophageal reflux disease)    severe   Headache    migraines   Hiatal hernia    neuropathy right foot and leg   Iron deficiency anemia    Macular degeneration (senile) of retina    Neuropathy    Nutcracker esophagus    Osteopenia    Pneumonia    Pulmonary fibrosis (HCC)    Raynaud's disease    Recurrent pneumonia    Due to aspiration   Thrombocytosis    Past Surgical History:  Procedure Laterality Date   56 HOUR PH STUDY N/A 10/13/2015   Procedure: 24 HOUR PH STUDY;  Surgeon: Iva Boop, MD;  Location: [facility];  Service: Endoscopy;  Laterality: N/A;   BREAST LUMPECTOMY     left, x 2   CATARACT EXTRACTION     both eyes   CHOLECYSTECTOMY  2005   COLONOSCOPY     ESOPHAGEAL MANOMETRY N/A 10/06/2015   Procedure: ESOPHAGEAL MANOMETRY (EM);   Surgeon: Ruffin Frederick, MD;  Location: [facility];  Service: Gastroenterology;  Laterality: N/A;   ESOPHAGEAL MANOMETRY N/A 03/10/2018   Procedure: ESOPHAGEAL MANOMETRY (EM);  Surgeon: Napoleon Form, MD;  Location: [facility];  Service: Endoscopy;  Laterality: N/A;   ESOPHAGEAL MANOMETRY N/A 10/15/2020   Procedure: ESOPHAGEAL MANOMETRY (EM);  Surgeon: Shellia Cleverly, DO;  Location: [facility];  Service: Gastroenterology;  Laterality: N/A;   ESOPHAGOGASTRODUODENOSCOPY (EGD) WITH PROPOFOL N/A 08/02/2022   Procedure: ESOPHAGOGASTRODUODENOSCOPY (EGD) WITH PROPOFOL;  Surgeon: Benancio Deeds, MD;  Location: [facility];  Service: Gastroenterology;  Laterality: N/A;   FOREIGN BODY REMOVAL  08/02/2022   Procedure: FOREIGN BODY REMOVAL;  Surgeon: Benancio Deeds, MD;  Location: [facility];  Service: Gastroenterology;;   LUNG BIOPSY     vats right 2003   SAVORY DILATION N/A 08/02/2022   Procedure: SAVORY DILATION;  Surgeon: Benancio Deeds, MD;  Location: [facility];  Service: Gastroenterology;  Laterality: N/A;   TOTAL ABDOMINAL HYSTERECTOMY     UPPER GASTROINTESTINAL ENDOSCOPY     Social History   Socioeconomic History   Marital status: Widowed    Spouse name: Not on file   Number of children: 2   Years of education: Not on file   Highest education  level: Not on file  Occupational History   Occupation: retired Runner, broadcasting/film/video    Comment: still teaching pre K   Tobacco Use   Smoking status: Former    Current packs/day: 0.00    Average packs/day: 0.3 packs/day for 10.0 years (2.5 ttl pk-yrs)    Types: Cigarettes    Start date: 11/15/1958    Quit date: 11/15/1968    Years since quitting: 55.2   Smokeless tobacco: Never  Vaping Use   Vaping status: Never Used  Substance and Sexual Activity   Alcohol use: No   Drug use: No   Sexual activity: Not Currently    Partners: Male  Other Topics Concern   Not on file  Social History Narrative   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   Social Drivers of Corporate investment banker Strain: Not on file  Food Insecurity: Low Risk  (09/27/2023)   Received from [facility]   Hunger Vital Sign    Worried About Running Out of Food in the Last Year: Never true    Ran Out of Food in the Last Year: Never true  Transportation Needs: No Transportation Needs (09/27/2023)   Received from [facility]    In the past 12 months, has lack of reliable transportation kept you from medical appointments, meetings, work or from getting things needed for daily living? : No  Physical Activity: Not on file  Stress: Not on file  Social Connections: Not on file   Family History  Problem Relation Age of Onset   Lymphoma Mother    Heart disease Father    Lung cancer Paternal Uncle    Heart disease Paternal Grandfather    Leukemia Paternal Grandmother    Cancer Maternal Grandmother        unknown type   Colon cancer Neg Hx    Esophageal cancer Neg Hx    Rectal cancer Neg Hx    Stomach cancer Neg Hx    Allergies  Allergen Reactions   Prednisone Other (See Comments)    Reaction not cited   Sulfa Antibiotics Other (See Comments)    Reaction not cited   Cortisone Other (See Comments)    Reaction not cited   Prior to Admission medications   Medication Sig Start Date End Date Taking? Authorizing Provider  acetaminophen (TYLENOL) 500 MG tablet Take 500 mg by mouth See admin instructions. Take 500 mg by mouth in the morning, at noontime, and bedtime- and an additional  500 mg in the afternoon as needed for unresolved mild pain/discomfort   Yes [provider]  amoxicillin-clavulanate (AUGMENTIN) 875-125 MG tablet Take 1 tablet by mouth 2 (two) times daily with a meal. 01/04/24 01/18/24 Yes [provider]  benzonatate (TESSALON) 200 MG capsule Take 1 capsule (200 mg  total) by mouth 3 (three) times daily as needed for cough. 07/27/23  Yes Parrett, Tammy S, NP  BISACODYL 5 MG EC tablet Take 5 mg by mouth at bedtime. 01/03/24  Yes [provider]  famotidine (PEPCID) 20 MG tablet Take 20 mg by mouth 2 (two) times daily as needed for indigestion or heartburn. 01/03/24  Yes [provider]  GERI-TUSSIN 100 MG/5ML liquid Take 15 mLs by mouth every 4 (four) hours as needed for to loosen phlegm or  cough. 12/15/23  Yes [provider]  lidocaine (LIDODERM) 5 % Place 1 patch onto the skin daily. Remove & Discard patch within 12 hours or as directed by MD Patient taking differently: Place 1 patch onto the skin daily as needed (for pain- Remove & Discard patch within 12 hours or as directed by MD). 11/26/23  Yes Theresia Lo, Turkey K, DO  omeprazole (PRILOSEC) 20 MG capsule Take 20 mg by mouth See admin instructions. Take 20 mg by mouth 30 minutes before breakfast and supper/evening meal 12/27/23  Yes [provider]  ondansetron (ZOFRAN) 4 MG tablet Take 4 mg by mouth every 8 (eight) hours as needed for nausea or vomiting. 01/03/24  Yes [provider]  oxyCODONE (ROXICODONE) 5 MG immediate release tablet Take 1 tablet (5 mg total) by mouth every 6 (six) hours as needed for severe pain (pain score 7-10). Patient taking differently: Take 5 mg by mouth every 4 (four) hours as needed (for pain). 11/26/23  Yes Theresia Lo, Victoria K, DO  psyllium (METAMUCIL SMOOTH TEXTURE) 58.6 % powder Take 1 packet by mouth 3 (three) times daily. Patient taking differently: Take 1 packet by mouth 3 (three) times daily as needed (for constipation- mix as directed). 11/26/23  Yes Theresia Lo, Turkey K, DO  sucralfate (CARAFATE) 1 g tablet Take 1 tablet (1 g total) by mouth every 6 (six) hours as needed. Slowly dissolve 1 tablet in 1 Tablespoon of water before ingesting Patient taking differently: Take 1 g by mouth See admin instructions. Take 1 gram by mouth in the  morning and bedtime- and an additional 1 gram up to twice a day as needed for abdominal pain 08/31/23  Yes Armbruster, Willaim Rayas, MD  omeprazole (PRILOSEC) 40 MG capsule TAKE ONE CAPSULE BY MOUTH TWICE DAILY Patient not taking: Reported on 01/14/2024 02/25/23   Armbruster, Willaim Rayas, MD   DG Femur Min 2 Views Left Result Date: 01/14/2024 CLINICAL DATA:  Pain after fall. EXAM: LEFT FEMUR 2 VIEWS; PELVIS - 1-2 VIEW COMPARISON:  None Available. FINDINGS: Femur: Nondisplaced femoral neck fracture. Distal femur is intact. The bones are subjectively under mineralized. Knee alignment is maintained. Pelvis: No additional fracture of the pelvis. Pubic rami are intact. Pubic symphysis and sacroiliac joints are congruent. The bones are subjectively under mineralized. Mild bilateral hip osteoarthritis. IMPRESSION: 1. Nondisplaced left femoral neck fracture. 2. No additional fracture of the pelvis. Electronically Signed   By: Narda Rutherford M.D.   On: 01/14/2024 16:02   DG Pelvis 1-2 Views Result Date: 01/14/2024 CLINICAL DATA:  Pain after fall. EXAM: LEFT FEMUR 2 VIEWS; PELVIS - 1-2 VIEW COMPARISON:  None Available. FINDINGS: Femur: Nondisplaced femoral neck fracture. Distal femur is intact. The bones are subjectively under mineralized. Knee alignment is maintained. Pelvis: No additional fracture of the pelvis. Pubic rami are intact. Pubic symphysis and sacroiliac joints are congruent. The bones are subjectively under mineralized. Mild bilateral hip osteoarthritis. IMPRESSION: 1. Nondisplaced left femoral neck fracture. 2. No additional fracture of the pelvis. Electronically Signed   By: Narda Rutherford M.D.   On: 01/14/2024 16:02    Positive ROS: All other systems have been reviewed and were otherwise negative with the exception of those mentioned in the HPI and as above.  Physical Exam: General: Cachectic female, alert, no acute distress Cardiovascular: No pedal edema Respiratory: No cyanosis, no use of accessory  musculature GI: No organomegaly, abdomen is soft and non-tender Skin: No lesions in the area of chief complaint Neurologic: Sensation intact distally Psychiatric:  Patient is competent for consent with normal mood and affect Lymphatic: No axillary or cervical lymphadenopathy  MUSCULOSKELETAL: Examination of the left hip reveals no skin wounds or lesions.  She has shortening and external rotation.  Pain with attempted logrolling of the hip.  She has 1+ palpable pedal pulses.  She has stocking glove sensory change, baseline from neuropathy.  Positive motor function dorsiflexion, plantarflexion, and great toe extension.  Assessment: Displaced left femoral neck fracture. Multiple medical problems.  Plan: I discussed the findings with the patient and her daughter.  I personally reviewed the x-rays and CT scan.  CT scan demonstrates valgus impacted femoral neck fracture with significant shortening and retroversion.  Unfortunately, she has an unstable left femoral neck fracture.  Patient is currently on home hospice for failure to thrive.  We discussed operative versus nonoperative treatment.  Ultimately, she will benefit from operative treatment of her left hip fracture for pain control and immediate mobilization out of bed.  Obviously due to malnutrition, she is at a higher risk of perioperative complications.  Without surgical treatment, I think she would undergo immense suffering due to the complications of immediate immobility.  Patient and family understand and elect to proceed.  Plan for left hip hemiarthroplasty tomorrow.  I have discussed the patient with [facility].  Please check prealbumin.  She will need a nutrition consult to optimize wound healing.  Hold chemical DVT prophylaxis.  N.p.o. after midnight.  All questions solicited and answered.  The risks, benefits, and alternatives were discussed with the patient. There are risks associated with the surgery including, but not limited to, problems with  anesthesia (death), infection, instability (giving out of the joint), dislocation, differences in leg length/angulation/rotation, fracture of bones, loosening or failure of implants, hematoma (blood accumulation) which may require surgical drainage, blood clots, pulmonary embolism, nerve injury (foot drop and lateral thigh numbness), and blood vessel injury. The patient understands these risks and elects to proceed.  Jonette Pesa, MD [phone]    01/14/2024 7:01 PM

## 2024-01-14 NOTE — H&P (Signed)
 History and Physical    Patient: [name]:[id number] DOB: 1941/04/19 DOA: 01/14/2024 DOS: the patient was seen and examined on 01/14/2024 PCP: System, Provider Not In  Patient coming from: Home - lives alone. Uses walker to ambulate. Has 24 hour care.    Chief Complaint: mechanical fall.   HPI: [name] is a 83 y.o. female with medical history significant of BOOP, gastroparesis, GERD, migraines, IDA, pulmonary fibrosis, SIADH, hx of TIA, eating disorder who presented to ED after a mechanical fall. She is on home hospice for FTT. She tripped on her walker and fell right onto her left hip. Caregiver was there, but fall was unwitnessed.   This is her 3rd significant fall since August. She was supposed to be moving to [place] today in [place], but bring in nursing care. She states she was walking and her leg just gave out. She fell down and does states she hit her head pretty hard on the door. She had immediate pain in the hip.    Denies any fever/chills, vision changes/headaches, chest pain or palpitations, shortness of breath or cough, abdominal pain, N/V/D, dysuria or leg swelling.    She does not smoke or drink alcohol.   ER Course:  vitals: afebrile, bp: 170/103, HR; 78 ,RR: 18, oxygen: 100%RA Pertinent labs: sodium: 127,  Left femur xray: nondisplaced left femoral neck fracture.  Pelvis xray: no additional fracture of the pelvis. Per above.  In ED: given 1L IVF bolus, zofran, toradol an fentanyl. Ortho consulted. [facility] asked to admit.   Review of Systems: As mentioned in the history of present illness. All other systems reviewed and are negative. Past Medical History:  Diagnosis Date   Allergy    Arthritis    Atherosclerosis of aorta (HCC) 2019   ct   BOOP (bronchiolitis obliterans with organizing pneumonia) (HCC) 2003   Bronchiectasis    Cataract    Esophageal dysmotility    Esophageal ulcer    Gastroparesis    GERD (gastroesophageal reflux disease)    severe    Headache    migraines   Hiatal hernia    neuropathy right foot and leg   Iron deficiency anemia    Macular degeneration (senile) of retina    Neuropathy    Nutcracker esophagus    Osteopenia    Pneumonia    Pulmonary fibrosis (HCC)    Raynaud's disease    Recurrent pneumonia    Due to aspiration   Thrombocytosis    Past Surgical History:  Procedure Laterality Date   44 HOUR PH STUDY N/A 10/13/2015   Procedure: 24 HOUR PH STUDY;  Surgeon: Iva Boop, MD;  Location: [facility];  Service: Endoscopy;  Laterality: N/A;   BREAST LUMPECTOMY     left, x 2   CATARACT EXTRACTION     both eyes   CHOLECYSTECTOMY  2005   COLONOSCOPY     ESOPHAGEAL MANOMETRY N/A 10/06/2015   Procedure: ESOPHAGEAL MANOMETRY (EM);  Surgeon: Ruffin Frederick, MD;  Location: [facility];  Service: Gastroenterology;  Laterality: N/A;   ESOPHAGEAL MANOMETRY N/A 03/10/2018   Procedure: ESOPHAGEAL MANOMETRY (EM);  Surgeon: Napoleon Form, MD;  Location: [facility];  Service: Endoscopy;  Laterality: N/A;   ESOPHAGEAL MANOMETRY N/A 10/15/2020   Procedure: ESOPHAGEAL MANOMETRY (EM);  Surgeon: Shellia Cleverly, DO;  Location: [facility];  Service: Gastroenterology;  Laterality: N/A;   ESOPHAGOGASTRODUODENOSCOPY (EGD) WITH PROPOFOL N/A 08/02/2022   Procedure: ESOPHAGOGASTRODUODENOSCOPY (EGD) WITH PROPOFOL;  Surgeon: Ileene Patrick  P, MD;  Location: [facility];  Service: Gastroenterology;  Laterality: N/A;   FOREIGN BODY REMOVAL  08/02/2022   Procedure: FOREIGN BODY REMOVAL;  Surgeon: Benancio Deeds, MD;  Location: [facility];  Service: Gastroenterology;;   LUNG BIOPSY     vats right 2003   SAVORY DILATION N/A 08/02/2022   Procedure: SAVORY DILATION;  Surgeon: Benancio Deeds, MD;  Location: [facility];  Service: Gastroenterology;  Laterality: N/A;   TOTAL ABDOMINAL HYSTERECTOMY     UPPER GASTROINTESTINAL ENDOSCOPY     Social History:  reports that she quit smoking about 55  years ago. Her smoking use included cigarettes. She started smoking about 65 years ago. She has a 2.5 pack-year smoking history. She has never used smokeless tobacco. She reports that she does not drink alcohol and does not use drugs.  Allergies  Allergen Reactions   Prednisone Other (See Comments)    Reaction not cited   Sulfa Antibiotics Other (See Comments)    Reaction not cited   Cortisone Other (See Comments)    Reaction not cited    Family History  Problem Relation Age of Onset   Lymphoma Mother    Heart disease Father    Lung cancer Paternal Uncle    Heart disease Paternal Grandfather    Leukemia Paternal Grandmother    Cancer Maternal Grandmother        unknown type   Colon cancer Neg Hx    Esophageal cancer Neg Hx    Rectal cancer Neg Hx    Stomach cancer Neg Hx     Prior to Admission medications   Medication Sig Start Date End Date Taking? Authorizing Provider  acetaminophen (TYLENOL) 500 MG tablet Take 500 mg by mouth daily as needed (pain).    [provider]  benzonatate (TESSALON) 200 MG capsule Take 1 capsule (200 mg total) by mouth 3 (three) times daily as needed for cough. 07/27/23   Parrett, Virgel Bouquet, NP  HYCODAN 5-1.5 MG/5ML syrup Take 5 mLs by mouth every 6 (six) hours as needed for cough. 10/04/23   [provider]  lidocaine (LIDODERM) 5 % Place 1 patch onto the skin daily. Remove & Discard patch within 12 hours or as directed by MD 11/26/23   Elayne Snare K, DO  omeprazole (PRILOSEC) 40 MG capsule TAKE ONE CAPSULE BY MOUTH TWICE DAILY 02/25/23   Armbruster, Willaim Rayas, MD  oxyCODONE (ROXICODONE) 5 MG immediate release tablet Take 1 tablet (5 mg total) by mouth every 6 (six) hours as needed for severe pain (pain score 7-10). 11/26/23   Elayne Snare K, DO  polyethylene glycol (MIRALAX) 17 g packet Take 17 g by mouth daily. 11/26/23   Elayne Snare K, DO  psyllium (METAMUCIL SMOOTH TEXTURE) 58.6 % powder Take 1 packet by mouth 3  (three) times daily. 11/26/23   Elayne Snare K, DO  senna-docusate (SENOKOT-S) 8.6-50 MG tablet Take 1 tablet by mouth daily. 11/26/23   Elayne Snare K, DO  sucralfate (CARAFATE) 1 g tablet Take 1 tablet (1 g total) by mouth every 6 (six) hours as needed. Slowly dissolve 1 tablet in 1 Tablespoon of water before ingesting Patient taking differently: Take 1 g by mouth every 6 (six) hours as needed. 08/31/23   Benancio Deeds, MD    Physical Exam: Vitals:   01/14/24 1357 01/14/24 1358 01/14/24 1515 01/14/24 1800  BP: (!) 170/103     Pulse: 69  78   Resp:   18   Temp:  98 F (36.7 C) 98 F (36.7 C)  TempSrc:   Oral   SpO2: 100%  97%   Weight:  36 kg    Height:  5\' 4"  (1.626 m)     General:  Appears calm and comfortable and is in NAD. Cachetic appearing. Small lip laceration  Eyes:  PERRL, EOMI, normal lids, iris ENT:  grossly normal hearing, lips & tongue, mmm; appropriate dentition Neck:  no LAD, masses or thyromegaly; no carotid bruits Cardiovascular:  RRR, no m/r/g. No LE edema.  Respiratory:   CTA bilaterally with no wheezes/rales/rhonchi.  Normal respiratory effort. Abdomen:  soft, NT, ND, NABS Back:   normal alignment, no CVAT Skin:  no rash or induration seen on limited exam. Multiple skin tears on left arm, knee laceration  Musculoskeletal:  grossly normal tone BUE LLE: TTP over left femoral head. No bruising/skin tears or edema. Knee with small laceration. No edema. No TTP. Foot with movement, sensation intact and +pedal pulses. No edema.  Lower extremity:  No LE edema.  Limited foot exam with no ulcerations.  2+ distal pulses. Psychiatric:  grossly normal mood and affect, speech fluent and appropriate, AOx3 Neurologic:  CN 2-12 grossly intact,  sensation intact   Radiological Exams on Admission: Independently reviewed - see discussion in A/P where applicable  DG Femur Min 2 Views Left Result Date: 01/14/2024 CLINICAL DATA:  Pain after fall. EXAM: LEFT  FEMUR 2 VIEWS; PELVIS - 1-2 VIEW COMPARISON:  None Available. FINDINGS: Femur: Nondisplaced femoral neck fracture. Distal femur is intact. The bones are subjectively under mineralized. Knee alignment is maintained. Pelvis: No additional fracture of the pelvis. Pubic rami are intact. Pubic symphysis and sacroiliac joints are congruent. The bones are subjectively under mineralized. Mild bilateral hip osteoarthritis. IMPRESSION: 1. Nondisplaced left femoral neck fracture. 2. No additional fracture of the pelvis. Electronically Signed   By: Narda Rutherford M.D.   On: 01/14/2024 16:02   DG Pelvis 1-2 Views Result Date: 01/14/2024 CLINICAL DATA:  Pain after fall. EXAM: LEFT FEMUR 2 VIEWS; PELVIS - 1-2 VIEW COMPARISON:  None Available. FINDINGS: Femur: Nondisplaced femoral neck fracture. Distal femur is intact. The bones are subjectively under mineralized. Knee alignment is maintained. Pelvis: No additional fracture of the pelvis. Pubic rami are intact. Pubic symphysis and sacroiliac joints are congruent. The bones are subjectively under mineralized. Mild bilateral hip osteoarthritis. IMPRESSION: 1. Nondisplaced left femoral neck fracture. 2. No additional fracture of the pelvis. Electronically Signed   By: Narda Rutherford M.D.   On: 01/14/2024 16:02    EKG: Independently reviewed.  NSR with rate 81; nonspecific ST changes with no evidence of acute ischemia   Labs on Admission: I have personally reviewed the available labs and imaging studies at the time of the admission.  Pertinent labs:   Sodium: 127   Assessment and Plan: Principal Problem:   Fracture of femoral neck, left (HCC) Active Problems:   Syndrome of inappropriate secretion of antidiuretic hormone (HCC)   GERD/LPR and nutcracke esophagus   Bronchiolectasis/pulmonary fibrosis   Iron deficiency anemia   Eating disorder    Assessment and Plan: * Fracture of femoral neck, left (HCC) 83 year old female with history of mechanical fall and  subsequent left femoral neck fracture who is on hospice for FTT and bronchiolectasis.  -admit to tele  -ortho consulted with plan for surgery tomorrow: Dr. Linna Caprice  -NPO at midnight  -SCDs -pain medication with tylenol, oxycodone and morphine for severe pain -robaxin PRN  -check type  and screen, vitamin D, prealbumin with hx of eating disorder -nutrition consult especially for wound healing with poor PO intake/eating disorder  -hip fracture protocol  -SW consult. Was supposed to move into [place] today    Syndrome of inappropriate secretion of antidiuretic hormone (HCC) Stable around baseline of 128-130 Continue to monitor   GERD/LPR and nutcracke esophagus Continue PPI and carafate   Bronchiolectasis/pulmonary fibrosis former smoker with bronchiectasis, recurrent aspiration pneumonia with history of reflux, emphysema and history of cryptogenic organizing pneumonia.  Followed by pulmonology  flutter valve, symbicort, spiriva, albuterol, levalbuterol and hypertonic saline caused palpitations and dizziness - has been asked to try liquid form of guaifenesin - she is too frail to undergo bronchoscopy - now on hospice   Iron deficiency anemia Stable   Eating disorder Life long eating disorder per family.  Does not eat much of anything  On hospice Check vitamin D, prealbumin Nutrition consult with surgery  Wound healing/recovery will be very difficult. Family aware      Advance Care Planning:   Code Status: Limited: Do not attempt resuscitation (DNR) -DNR-LIMITED -Do Not Intubate/DNI    Consults: ortho: Dr. Linna Caprice   DVT Prophylaxis: SCDs   Family Communication: son and daughter at bedside   Severity of Illness: The appropriate patient status for this patient is INPATIENT. Inpatient status is judged to be reasonable and necessary in order to provide the required intensity of service to ensure the patient's safety. The patient's presenting symptoms, physical exam  findings, and initial radiographic and laboratory data in the context of their chronic comorbidities is felt to place them at high risk for further clinical deterioration. Furthermore, it is not anticipated that the patient will be medically stable for discharge from the hospital within 2 midnights of admission.   * I certify that at the point of admission it is my clinical judgment that the patient will require inpatient hospital care spanning beyond 2 midnights from the point of admission due to high intensity of service, high risk for further deterioration and high frequency of surveillance required.*  Author: Orland Mustard, MD 01/14/2024 7:19 PM  For on call review [email or web address].

## 2024-01-14 NOTE — ED Triage Notes (Signed)
 Pt BIBA from home. C/o poss L hip dislocation after mech fall. No shortening and rotation noted in triage.  Pt does have bruising and bleeding on L upper lip, along with several skin tears on L arm.  No LOC, not on blood thinners.  Given oxycodone by [facility] RN, pt is responsive to speech and Ox4

## 2024-01-14 NOTE — Assessment & Plan Note (Signed)
 Stable around baseline of 128-130 Continue to monitor

## 2024-01-14 NOTE — Consult Note (Signed)
 ORTHOPAEDIC CONSULTATION  REQUESTING PHYSICIAN: Orland Mustard, MD  PCP:  System, Provider Not In  Chief Complaint: Left hip injury  HPI: [name] is a 83 y.o. female with multiple medical problems on home hospice for failure to thrive.  She had a mechanical ground-level fall earlier today, and injured her left hip.  She had immediate left hip pain and inability to weight-bear.  She was brought to the emergency department at [facility], where x-rays revealed a left femoral neck fracture.  Orthopedic consultation was placed for management of her left hip fracture.  [facility] has been consulted for admission.  She has skin tears to the left upper and left lower extremity that are already dressed.  Patient lives in a condo.  She ambulates with a walker.  She did not have any hip pain preceding this injury.  She has a 24-hour caregiver.  Past Medical History:  Diagnosis Date   Allergy    Arthritis    Atherosclerosis of aorta (HCC) 2019   ct   BOOP (bronchiolitis obliterans with organizing pneumonia) (HCC) 2003   Bronchiectasis    Cataract    Esophageal dysmotility    Esophageal ulcer    Gastroparesis    GERD (gastroesophageal reflux disease)    severe   Headache    migraines   Hiatal hernia    neuropathy right foot and leg   Iron deficiency anemia    Macular degeneration (senile) of retina    Neuropathy    Nutcracker esophagus    Osteopenia    Pneumonia    Pulmonary fibrosis (HCC)    Raynaud's disease    Recurrent pneumonia    Due to aspiration   Thrombocytosis    Past Surgical History:  Procedure Laterality Date   56 HOUR PH STUDY N/A 10/13/2015   Procedure: 24 HOUR PH STUDY;  Surgeon: Iva Boop, MD;  Location: [facility];  Service: Endoscopy;  Laterality: N/A;   BREAST LUMPECTOMY     left, x 2   CATARACT EXTRACTION     both eyes   CHOLECYSTECTOMY  2005   COLONOSCOPY     ESOPHAGEAL MANOMETRY N/A 10/06/2015   Procedure: ESOPHAGEAL MANOMETRY (EM);   Surgeon: Ruffin Frederick, MD;  Location: [facility];  Service: Gastroenterology;  Laterality: N/A;   ESOPHAGEAL MANOMETRY N/A 03/10/2018   Procedure: ESOPHAGEAL MANOMETRY (EM);  Surgeon: Napoleon Form, MD;  Location: [facility];  Service: Endoscopy;  Laterality: N/A;   ESOPHAGEAL MANOMETRY N/A 10/15/2020   Procedure: ESOPHAGEAL MANOMETRY (EM);  Surgeon: Shellia Cleverly, DO;  Location: [facility];  Service: Gastroenterology;  Laterality: N/A;   ESOPHAGOGASTRODUODENOSCOPY (EGD) WITH PROPOFOL N/A 08/02/2022   Procedure: ESOPHAGOGASTRODUODENOSCOPY (EGD) WITH PROPOFOL;  Surgeon: Benancio Deeds, MD;  Location: [facility];  Service: Gastroenterology;  Laterality: N/A;   FOREIGN BODY REMOVAL  08/02/2022   Procedure: FOREIGN BODY REMOVAL;  Surgeon: Benancio Deeds, MD;  Location: [facility];  Service: Gastroenterology;;   LUNG BIOPSY     vats right 2003   SAVORY DILATION N/A 08/02/2022   Procedure: SAVORY DILATION;  Surgeon: Benancio Deeds, MD;  Location: [facility];  Service: Gastroenterology;  Laterality: N/A;   TOTAL ABDOMINAL HYSTERECTOMY     UPPER GASTROINTESTINAL ENDOSCOPY     Social History   Socioeconomic History   Marital status: Widowed    Spouse name: Not on file   Number of children: 2   Years of education: Not on file   Highest education  level: Not on file  Occupational History   Occupation: retired Runner, broadcasting/film/video    Comment: still teaching pre K   Tobacco Use   Smoking status: Former    Current packs/day: 0.00    Average packs/day: 0.3 packs/day for 10.0 years (2.5 ttl pk-yrs)    Types: Cigarettes    Start date: 11/15/1958    Quit date: 11/15/1968    Years since quitting: 55.2   Smokeless tobacco: Never  Vaping Use   Vaping status: Never Used  Substance and Sexual Activity   Alcohol use: No   Drug use: No   Sexual activity: Not Currently    Partners: Male  Other Topics Concern   Not on file  Social History Narrative   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   Social Drivers of Corporate investment banker Strain: Not on file  Food Insecurity: Low Risk  (09/27/2023)   Received from [facility]   Hunger Vital Sign    Worried About Running Out of Food in the Last Year: Never true    Ran Out of Food in the Last Year: Never true  Transportation Needs: No Transportation Needs (09/27/2023)   Received from [facility]    In the past 12 months, has lack of reliable transportation kept you from medical appointments, meetings, work or from getting things needed for daily living? : No  Physical Activity: Not on file  Stress: Not on file  Social Connections: Not on file   Family History  Problem Relation Age of Onset   Lymphoma Mother    Heart disease Father    Lung cancer Paternal Uncle    Heart disease Paternal Grandfather    Leukemia Paternal Grandmother    Cancer Maternal Grandmother        unknown type   Colon cancer Neg Hx    Esophageal cancer Neg Hx    Rectal cancer Neg Hx    Stomach cancer Neg Hx    Allergies  Allergen Reactions   Prednisone Other (See Comments)    Reaction not cited   Sulfa Antibiotics Other (See Comments)    Reaction not cited   Cortisone Other (See Comments)    Reaction not cited   Prior to Admission medications   Medication Sig Start Date End Date Taking? Authorizing Provider  acetaminophen (TYLENOL) 500 MG tablet Take 500 mg by mouth See admin instructions. Take 500 mg by mouth in the morning, at noontime, and bedtime- and an additional  500 mg in the afternoon as needed for unresolved mild pain/discomfort   Yes [provider]  amoxicillin-clavulanate (AUGMENTIN) 875-125 MG tablet Take 1 tablet by mouth 2 (two) times daily with a meal. 01/04/24 01/18/24 Yes [provider]  benzonatate (TESSALON) 200 MG capsule Take 1 capsule (200 mg  total) by mouth 3 (three) times daily as needed for cough. 07/27/23  Yes Parrett, Tammy S, NP  BISACODYL 5 MG EC tablet Take 5 mg by mouth at bedtime. 01/03/24  Yes [provider]  famotidine (PEPCID) 20 MG tablet Take 20 mg by mouth 2 (two) times daily as needed for indigestion or heartburn. 01/03/24  Yes [provider]  GERI-TUSSIN 100 MG/5ML liquid Take 15 mLs by mouth every 4 (four) hours as needed for to loosen phlegm or  cough. 12/15/23  Yes [provider]  lidocaine (LIDODERM) 5 % Place 1 patch onto the skin daily. Remove & Discard patch within 12 hours or as directed by MD Patient taking differently: Place 1 patch onto the skin daily as needed (for pain- Remove & Discard patch within 12 hours or as directed by MD). 11/26/23  Yes Theresia Lo, Turkey K, DO  omeprazole (PRILOSEC) 20 MG capsule Take 20 mg by mouth See admin instructions. Take 20 mg by mouth 30 minutes before breakfast and supper/evening meal 12/27/23  Yes [provider]  ondansetron (ZOFRAN) 4 MG tablet Take 4 mg by mouth every 8 (eight) hours as needed for nausea or vomiting. 01/03/24  Yes [provider]  oxyCODONE (ROXICODONE) 5 MG immediate release tablet Take 1 tablet (5 mg total) by mouth every 6 (six) hours as needed for severe pain (pain score 7-10). Patient taking differently: Take 5 mg by mouth every 4 (four) hours as needed (for pain). 11/26/23  Yes Theresia Lo, Victoria K, DO  psyllium (METAMUCIL SMOOTH TEXTURE) 58.6 % powder Take 1 packet by mouth 3 (three) times daily. Patient taking differently: Take 1 packet by mouth 3 (three) times daily as needed (for constipation- mix as directed). 11/26/23  Yes Theresia Lo, Turkey K, DO  sucralfate (CARAFATE) 1 g tablet Take 1 tablet (1 g total) by mouth every 6 (six) hours as needed. Slowly dissolve 1 tablet in 1 Tablespoon of water before ingesting Patient taking differently: Take 1 g by mouth See admin instructions. Take 1 gram by mouth in the  morning and bedtime- and an additional 1 gram up to twice a day as needed for abdominal pain 08/31/23  Yes Armbruster, Willaim Rayas, MD  omeprazole (PRILOSEC) 40 MG capsule TAKE ONE CAPSULE BY MOUTH TWICE DAILY Patient not taking: Reported on 01/14/2024 02/25/23   Armbruster, Willaim Rayas, MD   DG Femur Min 2 Views Left Result Date: 01/14/2024 CLINICAL DATA:  Pain after fall. EXAM: LEFT FEMUR 2 VIEWS; PELVIS - 1-2 VIEW COMPARISON:  None Available. FINDINGS: Femur: Nondisplaced femoral neck fracture. Distal femur is intact. The bones are subjectively under mineralized. Knee alignment is maintained. Pelvis: No additional fracture of the pelvis. Pubic rami are intact. Pubic symphysis and sacroiliac joints are congruent. The bones are subjectively under mineralized. Mild bilateral hip osteoarthritis. IMPRESSION: 1. Nondisplaced left femoral neck fracture. 2. No additional fracture of the pelvis. Electronically Signed   By: Narda Rutherford M.D.   On: 01/14/2024 16:02   DG Pelvis 1-2 Views Result Date: 01/14/2024 CLINICAL DATA:  Pain after fall. EXAM: LEFT FEMUR 2 VIEWS; PELVIS - 1-2 VIEW COMPARISON:  None Available. FINDINGS: Femur: Nondisplaced femoral neck fracture. Distal femur is intact. The bones are subjectively under mineralized. Knee alignment is maintained. Pelvis: No additional fracture of the pelvis. Pubic rami are intact. Pubic symphysis and sacroiliac joints are congruent. The bones are subjectively under mineralized. Mild bilateral hip osteoarthritis. IMPRESSION: 1. Nondisplaced left femoral neck fracture. 2. No additional fracture of the pelvis. Electronically Signed   By: Narda Rutherford M.D.   On: 01/14/2024 16:02    Positive ROS: All other systems have been reviewed and were otherwise negative with the exception of those mentioned in the HPI and as above.  Physical Exam: General: Cachectic female, alert, no acute distress Cardiovascular: No pedal edema Respiratory: No cyanosis, no use of accessory  musculature GI: No organomegaly, abdomen is soft and non-tender Skin: No lesions in the area of chief complaint Neurologic: Sensation intact distally Psychiatric:  Patient is competent for consent with normal mood and affect Lymphatic: No axillary or cervical lymphadenopathy  MUSCULOSKELETAL: Examination of the left hip reveals no skin wounds or lesions.  She has shortening and external rotation.  Pain with attempted logrolling of the hip.  She has 1+ palpable pedal pulses.  She has stocking glove sensory change, baseline from neuropathy.  Positive motor function dorsiflexion, plantarflexion, and great toe extension.  Assessment: Displaced left femoral neck fracture. Multiple medical problems.  Plan: I discussed the findings with the patient and her daughter.  I personally reviewed the x-rays and CT scan.  CT scan demonstrates valgus impacted femoral neck fracture with significant shortening and retroversion.  Unfortunately, she has an unstable left femoral neck fracture.  Patient is currently on home hospice for failure to thrive.  We discussed operative versus nonoperative treatment.  Ultimately, she will benefit from operative treatment of her left hip fracture for pain control and immediate mobilization out of bed.  Obviously due to malnutrition, she is at a higher risk of perioperative complications.  Without surgical treatment, I think she would undergo immense suffering due to the complications of immediate immobility.  Patient and family understand and elect to proceed.  Plan for left hip hemiarthroplasty tomorrow.  I have discussed the patient with [facility].  Please check prealbumin.  She will need a nutrition consult to optimize wound healing.  Hold chemical DVT prophylaxis.  N.p.o. after midnight.  All questions solicited and answered.  The risks, benefits, and alternatives were discussed with the patient. There are risks associated with the surgery including, but not limited to, problems with  anesthesia (death), infection, instability (giving out of the joint), dislocation, differences in leg length/angulation/rotation, fracture of bones, loosening or failure of implants, hematoma (blood accumulation) which may require surgical drainage, blood clots, pulmonary embolism, nerve injury (foot drop and lateral thigh numbness), and blood vessel injury. The patient understands these risks and elects to proceed.  Jonette Pesa, MD [phone]    01/14/2024 7:01 PM

## 2024-01-14 NOTE — Assessment & Plan Note (Signed)
Continue PPI and carafate

## 2024-01-14 NOTE — Assessment & Plan Note (Addendum)
 83 year old female with history of mechanical fall and subsequent left femoral neck fracture who is on hospice for FTT and bronchiolectasis.  -admit to tele  -ortho consulted with plan for surgery tomorrow: Dr. Linna Caprice  -NPO at midnight  -SCDs -pain medication with tylenol, oxycodone and morphine for severe pain -robaxin PRN  -check type and screen, vitamin D, prealbumin with hx of eating disorder -nutrition consult especially for wound healing with poor PO intake/eating disorder  -hip fracture protocol  -SW consult. Was supposed to move into [place] today

## 2024-01-14 NOTE — Assessment & Plan Note (Signed)
 Stable

## 2024-01-14 NOTE — Assessment & Plan Note (Addendum)
 Life long eating disorder per family.  Does not eat much of anything  On hospice Check vitamin D, prealbumin Nutrition consult with surgery  Wound healing/recovery will be very difficult. Family aware

## 2024-01-14 NOTE — Assessment & Plan Note (Signed)
 former smoker with bronchiectasis, recurrent aspiration pneumonia with history of reflux, emphysema and history of cryptogenic organizing pneumonia.  Followed by pulmonology  flutter valve, symbicort, spiriva, albuterol, levalbuterol and hypertonic saline caused palpitations and dizziness - has been asked to try liquid form of guaifenesin - she is too frail to undergo bronchoscopy - now on hospice

## 2024-01-14 NOTE — ED Notes (Signed)
 ED TO INPATIENT HANDOFF REPORT  ED Nurse Name and Phone #: Majel Homer, RN  S Name/Age/Gender [name] 83 y.o. female Room/Bed: WA03/WA03  Code Status   Code Status: Limited: Do not attempt resuscitation (DNR) -DNR-LIMITED -Do Not Intubate/DNI   Home/SNF/Other Home Patient oriented to: self, place, time, and situation Is this baseline? Yes   Triage Complete: Triage complete  Chief Complaint Fracture of femoral neck, left (HCC) [S72.002A]  Triage Note Pt BIBA from home. C/o poss L hip dislocation after mech fall. No shortening and rotation noted in triage.  Pt does have bruising and bleeding on L upper lip, along with several skin tears on L arm.  No LOC, not on blood thinners.  Given oxycodone by [facility] RN, pt is responsive to speech and Ox4   Allergies Allergies  Allergen Reactions   Prednisone Other (See Comments)    Reaction not cited   Sulfa Antibiotics Other (See Comments)    Reaction not cited   Cortisone Other (See Comments)    Reaction not cited    Level of Care/Admitting Diagnosis ED Disposition     ED Disposition  Admit   Condition  --   Comment  Hospital Area: [facility] [[id number]]  Level of Care: Telemetry [5]  Admit to tele based on following criteria: Other see comments  Comments: electrolyte derangement  May admit patient to [facility] or [name] if equivalent level of care is available:: No  Covid Evaluation: Asymptomatic - no recent exposure (last 10 days) testing not required  Diagnosis: Fracture of femoral neck, left [facility]) [[id number]]  Admitting Physician: Orland Mustard [[id number]]  Attending Physician: Orland Mustard [phone]  Certification:: I certify this patient will need inpatient services for at least 2 midnights  Expected Medical Readiness: 01/17/2024          B Medical/Surgery History Past Medical History:  Diagnosis Date   Allergy    Arthritis    Atherosclerosis of aorta (HCC) 2019   ct   BOOP  (bronchiolitis obliterans with organizing pneumonia) (HCC) 2003   Bronchiectasis    Cataract    Esophageal dysmotility    Esophageal ulcer    Gastroparesis    GERD (gastroesophageal reflux disease)    severe   Headache    migraines   Hiatal hernia    neuropathy right foot and leg   Iron deficiency anemia    Macular degeneration (senile) of retina    Neuropathy    Nutcracker esophagus    Osteopenia    Pneumonia    Pulmonary fibrosis (HCC)    Raynaud's disease    Recurrent pneumonia    Due to aspiration   Thrombocytosis    Past Surgical History:  Procedure Laterality Date   31 HOUR PH STUDY N/A 10/13/2015   Procedure: 24 HOUR PH STUDY;  Surgeon: Iva Boop, MD;  Location: [facility];  Service: Endoscopy;  Laterality: N/A;   BREAST LUMPECTOMY     left, x 2   CATARACT EXTRACTION     both eyes   CHOLECYSTECTOMY  2005   COLONOSCOPY     ESOPHAGEAL MANOMETRY N/A 10/06/2015   Procedure: ESOPHAGEAL MANOMETRY (EM);  Surgeon: Ruffin Frederick, MD;  Location: [facility];  Service: Gastroenterology;  Laterality: N/A;   ESOPHAGEAL MANOMETRY N/A 03/10/2018   Procedure: ESOPHAGEAL MANOMETRY (EM);  Surgeon: Napoleon Form, MD;  Location: [facility];  Service: Endoscopy;  Laterality: N/A;   ESOPHAGEAL MANOMETRY N/A 10/15/2020   Procedure: ESOPHAGEAL MANOMETRY (EM);  Surgeon: Shellia Cleverly, DO;  Location: [facility];  Service: Gastroenterology;  Laterality: N/A;   ESOPHAGOGASTRODUODENOSCOPY (EGD) WITH PROPOFOL N/A 08/02/2022   Procedure: ESOPHAGOGASTRODUODENOSCOPY (EGD) WITH PROPOFOL;  Surgeon: Benancio Deeds, MD;  Location: [facility];  Service: Gastroenterology;  Laterality: N/A;   FOREIGN BODY REMOVAL  08/02/2022   Procedure: FOREIGN BODY REMOVAL;  Surgeon: Benancio Deeds, MD;  Location: [facility];  Service: Gastroenterology;;   LUNG BIOPSY     vats right 2003   SAVORY DILATION N/A 08/02/2022   Procedure: SAVORY DILATION;  Surgeon: Benancio Deeds, MD;  Location: [facility];  Service: Gastroenterology;  Laterality: N/A;   TOTAL ABDOMINAL HYSTERECTOMY     UPPER GASTROINTESTINAL ENDOSCOPY       A IV Location/Drains/Wounds Patient Lines/Drains/Airways Status     Active Line/Drains/Airways     Name Placement date Placement time Site Days   Peripheral IV 01/14/24 22 G Anterior;Left Forearm 01/14/24  1426  Forearm  less than 1            Intake/Output Last 24 hours  Intake/Output Summary (Last 24 hours) at 01/14/2024 2000 Last data filed at 01/14/2024 1928 Gross per 24 hour  Intake --  Output 50 ml  Net -50 ml    Labs/Imaging Results for orders placed or performed during the hospital encounter of 01/14/24 (from the past 48 hours)  Basic metabolic panel     Status: Abnormal   Collection Time: 01/14/24  2:20 PM  Result Value Ref Range   Sodium 127 (L) 135 - 145 mmol/L   Potassium 4.0 3.5 - 5.1 mmol/L   Chloride 91 (L) 98 - 111 mmol/L   CO2 27 22 - 32 mmol/L   Glucose, Bld 98 70 - 99 mg/dL    Comment: Glucose reference range applies only to samples taken after fasting for at least 8 hours.   BUN 8 8 - 23 mg/dL   Creatinine, Ser 5.62 (L) 0.44 - 1.00 mg/dL   Calcium 9.1 8.9 - 13.0 mg/dL   GFR, Estimated >86 >57 mL/min    Comment: (NOTE) Calculated using the CKD-EPI Creatinine Equation (2021)    Anion gap 9 5 - 15    Comment: Performed at [facility], [address], [place], [place] [address]  CBC     Status: None   Collection Time: 01/14/24  2:20 PM  Result Value Ref Range   WBC 9.3 4.0 - 10.5 K/uL   RBC 4.15 3.87 - 5.11 MIL/uL   Hemoglobin 12.2 12.0 - 15.0 g/dL   HCT 29.5 28.4 - 13.2 %   MCV 89.4 80.0 - 100.0 fL   MCH 29.4 26.0 - 34.0 pg   MCHC 32.9 30.0 - 36.0 g/dL   RDW 44.0 10.2 - 72.5 %   Platelets 378 150 - 400 K/uL   nRBC 0.0 0.0 - 0.2 %    Comment: Performed at [facility], [address] [place], [place] [address]  Protime-INR     Status: None    Collection Time: 01/14/24  6:52 PM  Result Value Ref Range   Prothrombin Time 13.1 11.4 - 15.2 seconds   INR 1.0 0.8 - 1.2    Comment: (NOTE) INR goal varies based on device and disease states. Performed at [facility], [address], [place], [place] [address]   Type and screen [facility]     Status: None (Preliminary result)   Collection Time: 01/14/24  6:52 PM  Result Value Ref Range   ABO/RH(D) PENDING    Antibody Screen PENDING    Sample Expiration      01/17/2024,2359 Performed at [facility], [address], [place], [place] [address]    CT HIP LEFT WO CONTRAST Result Date: 01/14/2024 CLINICAL DATA:  Left hip pain following fall with known femoral neck fracture, initial encounter EXAM: CT OF THE LEFT HIP WITHOUT CONTRAST TECHNIQUE: Multidetector CT imaging of the left hip was performed according to the standard protocol. Multiplanar CT image reconstructions were also generated. RADIATION DOSE REDUCTION: This exam was performed according to the departmental dose-optimization program which includes automated exposure control, adjustment of the mA and/or kV according to patient size and/or use of iterative reconstruction technique. COMPARISON:  Plain films from earlier in the same day. FINDINGS: Bones/Joint/Cartilage Subcapital left femoral neck fracture is noted with mild impaction at the fracture site. No dislocation is noted. No other fracture is seen. Mild left hip joint effusion is noted. Ligaments Suboptimally assessed by CT. Muscles and Tendons Surrounding musculature appears within normal limits. Soft tissues Subcapital left femoral neck fracture with mild impaction at the fracture site. Associated joint effusion is noted. Electronically Signed   By: Alcide Clever M.D.   On: 01/14/2024 19:50   CT HEAD WO CONTRAST ( ) Result Date: 01/14/2024 CLINICAL DATA:  Head trauma, moderate to severe. Mechanical fall. Bleeding from the  upper lip. EXAM: CT HEAD WITHOUT CONTRAST TECHNIQUE: Contiguous axial images were obtained from the base of the skull through the vertex without intravenous contrast. RADIATION DOSE REDUCTION: This exam was performed according to the departmental dose-optimization program which includes automated exposure control, adjustment of the mA and/or kV according to patient size and/or use of iterative reconstruction technique. COMPARISON:  CT head without contrast 11/26/2023 FINDINGS: Brain: No acute infarct, hemorrhage, or mass lesion is present. No significant white matter lesions are present. Deep brain nuclei are within normal limits. The ventricles are of normal size. No significant extraaxial fluid collection is present. The brainstem and cerebellum are within normal limits. Midline structures are within normal limits. Vascular: No hyperdense vessel or unexpected calcification. Skull: Calvarium is intact. No focal lytic or blastic lesions are present. No significant extracranial soft tissue lesion is present. Sinuses/Orbits: The paranasal sinuses and mastoid air cells are clear. The globes and orbits are within normal limits. IMPRESSION: Normal CT appearance of the brain. Electronically Signed   By: Marin Roberts M.D.   On: 01/14/2024 19:41   DG Femur Min 2 Views Left Result Date: 01/14/2024 CLINICAL DATA:  Pain after fall. EXAM: LEFT FEMUR 2 VIEWS; PELVIS - 1-2 VIEW COMPARISON:  None Available. FINDINGS: Femur: Nondisplaced femoral neck fracture. Distal femur is intact. The bones are subjectively under mineralized. Knee alignment is maintained. Pelvis: No additional fracture of the pelvis. Pubic rami are intact. Pubic symphysis and sacroiliac joints are congruent. The bones are subjectively under mineralized. Mild bilateral hip osteoarthritis. IMPRESSION: 1. Nondisplaced left femoral neck fracture. 2. No additional fracture of the pelvis. Electronically Signed   By: Narda Rutherford M.D.   On: 01/14/2024  16:02   DG Pelvis 1-2 Views Result Date: 01/14/2024 CLINICAL DATA:  Pain after fall. EXAM: LEFT FEMUR 2 VIEWS; PELVIS - 1-2 VIEW COMPARISON:  None Available. FINDINGS: Femur: Nondisplaced femoral neck fracture. Distal femur is intact. The bones are subjectively under mineralized. Knee alignment is maintained. Pelvis: No additional fracture of the pelvis. Pubic rami are intact. Pubic symphysis and sacroiliac joints are congruent. The bones  are subjectively under mineralized. Mild bilateral hip osteoarthritis. IMPRESSION: 1. Nondisplaced left femoral neck fracture. 2. No additional fracture of the pelvis. Electronically Signed   By: Narda Rutherford M.D.   On: 01/14/2024 16:02    Pending Labs Unresulted Labs (From admission, onward)     Start     Ordered   01/15/24 0500  CBC  Tomorrow morning,   R        01/14/24 1826   01/15/24 0500  Basic metabolic panel  Tomorrow morning,   R        01/14/24 1826   01/14/24 1927  ABO/Rh  Once,   R        01/14/24 1927   01/14/24 1908  Prealbumin  Once,   R        01/14/24 1907   01/14/24 1826  Urinalysis, Routine w reflex microscopic -Urine, Clean Catch  Once,   R       Question:  Specimen Source  Answer:  Urine, Clean Catch   01/14/24 1826   01/14/24 1826  VITAMIN D 25 Hydroxy (Vit-D Deficiency, Fractures)  Once,   R        01/14/24 1826            Vitals/Pain Today's Vitals   01/14/24 1758 01/14/24 1800 01/14/24 1916 01/14/24 1945  BP:   (!) 164/101 (!) 160/94  Pulse:    66  Resp:   14 15  Temp:  98 F (36.7 C)  98 F (36.7 C)  TempSrc:    Oral  SpO2:    98%  Weight:      Height:      PainSc: 10-Worst pain ever  10-Worst pain ever     Isolation Precautions No active isolations  Medications Medications  bisacodyl (DULCOLAX) EC tablet 5 mg (has no administration in time range)  pantoprazole (PROTONIX) EC tablet 40 mg (has no administration in time range)  sucralfate (CARAFATE) tablet 1 g (has no administration in time range)   LORazepam (ATIVAN) tablet 0.5 mg (0.5 mg Oral Given 01/14/24 1919)  morphine (PF) 2 MG/ML injection 0.5 mg (0.5 mg Intravenous Given 01/14/24 1916)  methocarbamol (ROBAXIN) tablet 500 mg (500 mg Oral Given 01/14/24 1919)    Or  methocarbamol (ROBAXIN) injection 500 mg ( Intravenous See Alternative 01/14/24 1919)  oxyCODONE (Oxy IR/ROXICODONE) immediate release tablet 5 mg (has no administration in time range)  metoprolol tartrate (LOPRESSOR) injection 2.5 mg (has no administration in time range)  fentaNYL (SUBLIMAZE) injection 25 mcg (25 mcg Intravenous Given 01/14/24 1509)  ondansetron (ZOFRAN) injection 4 mg (4 mg Intravenous Given 01/14/24 1509)  ketorolac (TORADOL) 30 MG/ML injection 30 mg (30 mg Intravenous Given 01/14/24 1610)  sodium chloride 0.9 % bolus 1,000 mL (0 mLs Intravenous Stopped 01/14/24 1800)  HYDROmorphone (DILAUDID) injection 0.5 mg (0.5 mg Intravenous Given 01/14/24 1758)    Mobility walks with device     Focused Assessments See Chart   R Recommendations: See Admitting Provider Note  Report given to:   Additional Notes: See Chart

## 2024-01-14 NOTE — ED Provider Notes (Signed)
 [place] EMERGENCY DEPARTMENT AT [facility] Provider Note   CSN: [id number] Arrival date & time: 01/14/24  1332     History  Chief Complaint  Patient presents with   Hip Pain   Fall    [name] is a 83 y.o. female with a history of pulmonary fibrosis, home hospice, presenting from home with a fall from standing onto her left hip and complaint of left hip pain.  The patient typically uses a walker at home and has a healthcare aide but the walker got away from her and she fell today.  Her daughter is present at the bedside.  The patient is reporting pain in her left hip difficulty moving it.  She was given oxycodone by EMS reports she feels extremely drowsy from that.  She does have a small contusion to her face and feels that she may have struck her lip and also some superficial abrasions to her left arm.  Patient reports she does not take any blood thinning medication  EMS provided supplemental history  HPI     Home Medications Prior to Admission medications   Medication Sig Start Date End Date Taking? Authorizing Provider  acetaminophen (TYLENOL) 500 MG tablet Take 500 mg by mouth See admin instructions. Take 500 mg by mouth in the morning, at noontime, and bedtime- and an additional  500 mg in the afternoon as needed for unresolved mild pain/discomfort   Yes [provider]  amoxicillin-clavulanate (AUGMENTIN) 875-125 MG tablet Take 1 tablet by mouth 2 (two) times daily with a meal. 01/04/24 01/18/24 Yes [provider]  benzonatate (TESSALON) 200 MG capsule Take 1 capsule (200 mg total) by mouth 3 (three) times daily as needed for cough. 07/27/23  Yes Parrett, Tammy S, NP  BISACODYL 5 MG EC tablet Take 5 mg by mouth at bedtime. 01/03/24  Yes [provider]  famotidine (PEPCID) 20 MG tablet Take 20 mg by mouth 2 (two) times daily as needed for indigestion or heartburn. 01/03/24  Yes [provider]  GERI-TUSSIN 100 MG/5ML liquid Take  15 mLs by mouth every 4 (four) hours as needed for to loosen phlegm or cough. 12/15/23  Yes [provider]  lidocaine (LIDODERM) 5 % Place 1 patch onto the skin daily. Remove & Discard patch within 12 hours or as directed by MD Patient taking differently: Place 1 patch onto the skin daily as needed (for pain- Remove & Discard patch within 12 hours or as directed by MD). 11/26/23  Yes Theresia Lo, Turkey K, DO  omeprazole (PRILOSEC) 20 MG capsule Take 20 mg by mouth See admin instructions. Take 20 mg by mouth 30 minutes before breakfast and supper/evening meal 12/27/23  Yes [provider]  ondansetron (ZOFRAN) 4 MG tablet Take 4 mg by mouth every 8 (eight) hours as needed for nausea or vomiting. 01/03/24  Yes [provider]  oxyCODONE (ROXICODONE) 5 MG immediate release tablet Take 1 tablet (5 mg total) by mouth every 6 (six) hours as needed for severe pain (pain score 7-10). Patient taking differently: Take 5 mg by mouth every 4 (four) hours as needed (for pain). 11/26/23  Yes Theresia Lo, Victoria K, DO  psyllium (METAMUCIL SMOOTH TEXTURE) 58.6 % powder Take 1 packet by mouth 3 (three) times daily. Patient taking differently: Take 1 packet by mouth 3 (three) times daily as needed (for constipation- mix as directed). 11/26/23  Yes Theresia Lo, Turkey K, DO  sucralfate (CARAFATE) 1 g tablet Take 1 tablet (1 g  total) by mouth every 6 (six) hours as needed. Slowly dissolve 1 tablet in 1 Tablespoon of water before ingesting Patient taking differently: Take 1 g by mouth See admin instructions. Take 1 gram by mouth in the morning and bedtime- and an additional 1 gram up to twice a day as needed for abdominal pain 08/31/23  Yes Armbruster, Willaim Rayas, MD  omeprazole (PRILOSEC) 40 MG capsule TAKE ONE CAPSULE BY MOUTH TWICE DAILY Patient not taking: Reported on 01/14/2024 02/25/23   Armbruster, Willaim Rayas, MD      Allergies    Prednisone, Sulfa antibiotics, and Cortisone    Review of Systems    Review of Systems  Physical Exam Updated Vital Signs BP (!) 163/96 (BP Location: Left Arm)   Pulse 71   Temp 97.6 F (36.4 C)   Resp 16   Ht 5\' 4"  (1.626 m)   Wt 36 kg   SpO2 96%   BMI 13.62 kg/m  Physical Exam Constitutional:      General: She is not in acute distress.    Comments: Thin, cachectic  HENT:     Head: Normocephalic.     Comments: Small laceration to left upper lateral lip Eyes:     Conjunctiva/sclera: Conjunctivae normal.     Pupils: Pupils are equal, round, and reactive to light.  Cardiovascular:     Rate and Rhythm: Normal rate and regular rhythm.  Pulmonary:     Effort: Pulmonary effort is normal. No respiratory distress.  Abdominal:     General: There is no distension.     Tenderness: There is no abdominal tenderness.  Musculoskeletal:     Comments: Left leg is held straight and unable to tolerate any range of motion of the left hip, tenderness over the left lateral pelvis, no visible deformity of the femur lower extremity Superficial skin tear abrasions to the left elbow and the left forearm  Skin:    General: Skin is warm and dry.  Neurological:     General: No focal deficit present.     Mental Status: She is alert and oriented to person, place, and time. Mental status is at baseline.  Psychiatric:        Mood and Affect: Mood normal.        Behavior: Behavior normal.     ED Results / Procedures / Treatments   Labs (all labs ordered are listed, but only abnormal results are displayed) Labs Reviewed  BASIC METABOLIC PANEL - Abnormal; Notable for the following components:      Result Value   Sodium 127 (*)    Chloride 91 (*)    Creatinine, Ser 0.35 (*)    All other components within normal limits  URINALYSIS, ROUTINE W REFLEX MICROSCOPIC - Abnormal; Notable for the following components:   APPearance CLOUDY (*)    Bacteria, UA RARE (*)    All other components within normal limits  SURGICAL PCR SCREEN  CBC  PROTIME-INR  VITAMIN D 25  HYDROXY (VIT D DEFICIENCY, FRACTURES)  CBC  BASIC METABOLIC PANEL  PREALBUMIN  TYPE AND SCREEN  ABO/RH    EKG None  Radiology CT HIP LEFT WO CONTRAST Result Date: 01/14/2024 CLINICAL DATA:  Left hip pain following fall with known femoral neck fracture, initial encounter EXAM: CT OF THE LEFT HIP WITHOUT CONTRAST TECHNIQUE: Multidetector CT imaging of the left hip was performed according to the standard protocol. Multiplanar CT image reconstructions were also generated. RADIATION DOSE REDUCTION: This exam was performed according to the  departmental dose-optimization program which includes automated exposure control, adjustment of the mA and/or kV according to patient size and/or use of iterative reconstruction technique. COMPARISON:  Plain films from earlier in the same day. FINDINGS: Bones/Joint/Cartilage Subcapital left femoral neck fracture is noted with mild impaction at the fracture site. No dislocation is noted. No other fracture is seen. Mild left hip joint effusion is noted. Ligaments Suboptimally assessed by CT. Muscles and Tendons Surrounding musculature appears within normal limits. Soft tissues Subcapital left femoral neck fracture with mild impaction at the fracture site. Associated joint effusion is noted. Electronically Signed   By: Alcide Clever M.D.   On: 01/14/2024 19:50   CT HEAD WO CONTRAST ( ) Result Date: 01/14/2024 CLINICAL DATA:  Head trauma, moderate to severe. Mechanical fall. Bleeding from the upper lip. EXAM: CT HEAD WITHOUT CONTRAST TECHNIQUE: Contiguous axial images were obtained from the base of the skull through the vertex without intravenous contrast. RADIATION DOSE REDUCTION: This exam was performed according to the departmental dose-optimization program which includes automated exposure control, adjustment of the mA and/or kV according to patient size and/or use of iterative reconstruction technique. COMPARISON:  CT head without contrast 11/26/2023 FINDINGS: Brain: No  acute infarct, hemorrhage, or mass lesion is present. No significant white matter lesions are present. Deep brain nuclei are within normal limits. The ventricles are of normal size. No significant extraaxial fluid collection is present. The brainstem and cerebellum are within normal limits. Midline structures are within normal limits. Vascular: No hyperdense vessel or unexpected calcification. Skull: Calvarium is intact. No focal lytic or blastic lesions are present. No significant extracranial soft tissue lesion is present. Sinuses/Orbits: The paranasal sinuses and mastoid air cells are clear. The globes and orbits are within normal limits. IMPRESSION: Normal CT appearance of the brain. Electronically Signed   By: Marin Roberts M.D.   On: 01/14/2024 19:41   DG Femur Min 2 Views Left Result Date: 01/14/2024 CLINICAL DATA:  Pain after fall. EXAM: LEFT FEMUR 2 VIEWS; PELVIS - 1-2 VIEW COMPARISON:  None Available. FINDINGS: Femur: Nondisplaced femoral neck fracture. Distal femur is intact. The bones are subjectively under mineralized. Knee alignment is maintained. Pelvis: No additional fracture of the pelvis. Pubic rami are intact. Pubic symphysis and sacroiliac joints are congruent. The bones are subjectively under mineralized. Mild bilateral hip osteoarthritis. IMPRESSION: 1. Nondisplaced left femoral neck fracture. 2. No additional fracture of the pelvis. Electronically Signed   By: Narda Rutherford M.D.   On: 01/14/2024 16:02   DG Pelvis 1-2 Views Result Date: 01/14/2024 CLINICAL DATA:  Pain after fall. EXAM: LEFT FEMUR 2 VIEWS; PELVIS - 1-2 VIEW COMPARISON:  None Available. FINDINGS: Femur: Nondisplaced femoral neck fracture. Distal femur is intact. The bones are subjectively under mineralized. Knee alignment is maintained. Pelvis: No additional fracture of the pelvis. Pubic rami are intact. Pubic symphysis and sacroiliac joints are congruent. The bones are subjectively under mineralized. Mild bilateral  hip osteoarthritis. IMPRESSION: 1. Nondisplaced left femoral neck fracture. 2. No additional fracture of the pelvis. Electronically Signed   By: Narda Rutherford M.D.   On: 01/14/2024 16:02    Procedures Procedures    Medications Ordered in ED Medications  bisacodyl (DULCOLAX) EC tablet 5 mg (5 mg Oral Patient Refused/Not Given 01/14/24 2054)  pantoprazole (PROTONIX) EC tablet 40 mg (has no administration in time range)  sucralfate (CARAFATE) tablet 1 g (has no administration in time range)  LORazepam (ATIVAN) tablet 0.5 mg (0.5 mg Oral Given 01/14/24 1919)  morphine (PF)  2 MG/ML injection 0.5 mg (0.5 mg Intravenous Given 01/14/24 1916)  methocarbamol (ROBAXIN) tablet 500 mg (500 mg Oral Given 01/14/24 1919)    Or  methocarbamol (ROBAXIN) injection 500 mg ( Intravenous See Alternative 01/14/24 1919)  chlorhexidine (HIBICLENS) 4 % liquid 4 Application (has no administration in time range)  oxyCODONE (Oxy IR/ROXICODONE) immediate release tablet 5 mg (has no administration in time range)  metoprolol tartrate (LOPRESSOR) injection 2.5 mg (has no administration in time range)  feeding supplement (ENSURE PRE-SURGERY) liquid 296 mL (has no administration in time range)  fentaNYL (SUBLIMAZE) injection 25 mcg (25 mcg Intravenous Given 01/14/24 1509)  ondansetron (ZOFRAN) injection 4 mg (4 mg Intravenous Given 01/14/24 1509)  ketorolac (TORADOL) 30 MG/ML injection 30 mg (30 mg Intravenous Given 01/14/24 1610)  sodium chloride 0.9 % bolus 1,000 mL (0 mLs Intravenous Stopped 01/14/24 1800)  HYDROmorphone (DILAUDID) injection 0.5 mg (0.5 mg Intravenous Given 01/14/24 1758)    ED Course/ Medical Decision Making/ A&P Clinical Course as of 01/14/24 2154  Sat Jan 14, 2024  1622 Paging ortho [MT]  1741 Dr Linna Caprice - okay to admit to [facility] - no clear timeline for OR for repair.  Admitted to hospitalist [MT]    Clinical Course User Index [MT] Anakaren Campion, Kermit Balo, MD                                 Medical Decision  Making Amount and/or Complexity of Data Reviewed Labs: ordered. Radiology: ordered.  Risk Prescription drug management. Decision regarding hospitalization.   Patient is here with a mechanical fall and concern for left hip injury.  Clinically I suspect this may be a fracture.  She is neurovascularly intact.  She is thin, cachectic, frail, likely has osteoporosis or osteopenia given her body habitus and her age.  Abrasions to left forearm are fairly superficial, doubt fracture of the arm.  These were cleaned and dressed at home and will be cleaned and redressed here in the ED.  Patient was offered pain medication but refused initially.  Subsequently the patient requested pain medicine and fentanyl was given with some small improvement, and then Toradol was given.  IV saline was ordered for hyponatremia.  Her daughter was present at the bedside.  Supplemental history provided by EMS.  I personally reviewed and interpreted the patient's labs and imaging, notable for left femoral neck fracture.  Labs are notable for hyponatremia and hypochloremia.  In private the patient's daughter and son reports to me the patient is on home hospice due to failure to thrive, weight loss, diminished appetite.  Until now she had been fairly functional and quasi independent although she has had 24-hour home care for the past 2 months.  I consulted with the orthopedic surgeon on-call, who recommended - see ED course  - Dr Linna Caprice  Patient was admitted to the hospitalist for pain control and orthopedic follow-up        Final Clinical Impression(s) / ED Diagnoses Final diagnoses:  Other closed fracture of left femur, unspecified portion of femur, initial encounter [facility])    Rx / DC Orders ED Discharge Orders     None         Terald Sleeper, MD 01/14/24 2155

## 2024-01-14 NOTE — ED Notes (Signed)
 Multiple skin tears noted to the patient's left arm and one skin tear noted to the left knee. Cleaned wounds, placed nonstick dressing and wrapped with Kerlix. Patient tolerated well.

## 2024-01-15 ENCOUNTER — Inpatient Hospital Stay (HOSPITAL_COMMUNITY)

## 2024-01-15 ENCOUNTER — Inpatient Hospital Stay (HOSPITAL_COMMUNITY): Admitting: Anesthesiology

## 2024-01-15 ENCOUNTER — Encounter (HOSPITAL_COMMUNITY)
Admission: EM | Disposition: A | Discharge status: Skilled Nursing Facility | Payer: Self-pay | Source: Home / Self Care | Attending: Family Medicine

## 2024-01-15 DIAGNOSIS — E222 Syndrome of inappropriate secretion of antidiuretic hormone: Secondary | ICD-10-CM | POA: Diagnosis not present

## 2024-01-15 DIAGNOSIS — D509 Iron deficiency anemia, unspecified: Secondary | ICD-10-CM

## 2024-01-15 DIAGNOSIS — J8410 Pulmonary fibrosis, unspecified: Secondary | ICD-10-CM

## 2024-01-15 DIAGNOSIS — J479 Bronchiectasis, uncomplicated: Secondary | ICD-10-CM

## 2024-01-15 DIAGNOSIS — G4730 Sleep apnea, unspecified: Secondary | ICD-10-CM

## 2024-01-15 DIAGNOSIS — S72002A Fracture of unspecified part of neck of left femur, initial encounter for closed fracture: Secondary | ICD-10-CM | POA: Diagnosis not present

## 2024-01-15 DIAGNOSIS — K219 Gastro-esophageal reflux disease without esophagitis: Secondary | ICD-10-CM

## 2024-01-15 HISTORY — PX: ANTERIOR APPROACH HEMI HIP ARTHROPLASTY: SHX6690

## 2024-01-15 LAB — BASIC METABOLIC PANEL
Anion gap: 11 (ref 5–15)
BUN: 6 mg/dL — ABNORMAL LOW (ref 8–23)
CO2: 25 mmol/L (ref 22–32)
Calcium: 8.7 mg/dL — ABNORMAL LOW (ref 8.9–10.3)
Chloride: 92 mmol/L — ABNORMAL LOW (ref 98–111)
Creatinine, Ser: 0.45 mg/dL (ref 0.44–1.00)
GFR, Estimated: >60 mL/min (ref >60)
Glucose, Bld: 215 mg/dL — ABNORMAL HIGH (ref 70–99)
Potassium: 3.2 mmol/L — ABNORMAL LOW (ref 3.5–5.1)
Sodium: 128 mmol/L — ABNORMAL LOW (ref 135–145)

## 2024-01-15 LAB — CBC
HCT: 40.6 % (ref 36.0–46.0)
Hemoglobin: 12.7 g/dL (ref 12.0–15.0)
MCH: 29.1 pg (ref 26.0–34.0)
MCHC: 31.3 g/dL (ref 30.0–36.0)
MCV: 93.1 fL (ref 80.0–100.0)
Platelets: 349 10*3/uL (ref 150–400)
RBC: 4.36 MIL/uL (ref 3.87–5.11)
RDW: 12.3 % (ref 11.5–15.5)
WBC: 10.9 10*3/uL — ABNORMAL HIGH (ref 4.0–10.5)
nRBC: 0 % (ref 0.0–0.2)

## 2024-01-15 LAB — VITAMIN D 25 HYDROXY (VIT D DEFICIENCY, FRACTURES): Vit D, 25-Hydroxy: 71.92 ng/mL (ref 30–100)

## 2024-01-15 LAB — PREALBUMIN: Prealbumin: 19 mg/dL (ref 18–38)

## 2024-01-15 LAB — SURGICAL PCR SCREEN
MRSA, PCR: NEGATIVE
Staphylococcus aureus: NEGATIVE

## 2024-01-15 SURGERY — HEMIARTHROPLASTY, HIP, DIRECT ANTERIOR APPROACH, FOR FRACTURE
Anesthesia: General | Site: Hip | Laterality: Left

## 2024-01-15 MED ORDER — SODIUM CHLORIDE 0.9 % IV SOLN: INTRAVENOUS | Status: DC

## 2024-01-15 MED ORDER — ASPIRIN 81 MG PO CHEW: 81.0000 mg | CHEWABLE_TABLET | Freq: Two times a day (BID) | ORAL | 0 refills | Status: AC

## 2024-01-15 MED ORDER — PHENYLEPHRINE HCL-NACL 20-0.9 MG/250ML-% IV SOLN
INTRAVENOUS | Status: DC | PRN
Start: 2024-01-15 — End: 2024-01-15
  Administered 2024-01-15: 25 ug/min via INTRAVENOUS

## 2024-01-15 MED ORDER — LACTATED RINGERS IV SOLN: INTRAVENOUS | Status: DC | PRN

## 2024-01-15 MED ORDER — POLYETHYLENE GLYCOL 3350 17 G PO PACK: 17.0000 g | PACK | Freq: Every day | ORAL | Status: DC | PRN

## 2024-01-15 MED ORDER — TRANEXAMIC ACID-NACL 1000-0.7 MG/100ML-% IV SOLN
1000.0000 mg | Freq: Once | INTRAVENOUS | Status: AC
  Administered 2024-01-15: 1000 mg via INTRAVENOUS
  Filled 2024-01-15: qty 100

## 2024-01-15 MED ORDER — OXYCODONE HCL 5 MG/5ML PO SOLN: 5.0000 mg | Freq: Once | ORAL | Status: DC | PRN

## 2024-01-15 MED ORDER — BUPIVACAINE-EPINEPHRINE (PF) 0.25% -1:200000 IJ SOLN
INTRAMUSCULAR | Status: AC
  Filled 2024-01-15: qty 30

## 2024-01-15 MED ORDER — SODIUM CHLORIDE (PF) 0.9 % IJ SOLN
INTRAMUSCULAR | Status: AC
  Filled 2024-01-15: qty 50

## 2024-01-15 MED ORDER — MORPHINE SULFATE (PF) 2 MG/ML IV SOLN
0.5000 mg | INTRAVENOUS | Status: DC | PRN
  Administered 2024-01-15: 0.5 mg via INTRAVENOUS
  Filled 2024-01-15: qty 1

## 2024-01-15 MED ORDER — DOCUSATE SODIUM 100 MG PO CAPS
100.0000 mg | ORAL_CAPSULE | Freq: Two times a day (BID) | ORAL | Status: DC
  Administered 2024-01-15 – 2024-01-18 (×7): 100 mg via ORAL
  Filled 2024-01-15 (×7): qty 1

## 2024-01-15 MED ORDER — HYDROMORPHONE HCL 2 MG/ML IJ SOLN
INTRAMUSCULAR | Status: AC
  Filled 2024-01-15: qty 1

## 2024-01-15 MED ORDER — MENTHOL 3 MG MT LOZG: 1.0000 | LOZENGE | OROMUCOSAL | Status: DC | PRN

## 2024-01-15 MED ORDER — CEFAZOLIN SODIUM-DEXTROSE 2-4 GM/100ML-% IV SOLN
2.0000 g | INTRAVENOUS | Status: AC
  Administered 2024-01-15: 1 g via INTRAVENOUS

## 2024-01-15 MED ORDER — PROPOFOL 10 MG/ML IV BOLUS
INTRAVENOUS | Status: AC
  Filled 2024-01-15: qty 20

## 2024-01-15 MED ORDER — SENNA 8.6 MG PO TABS
1.0000 | ORAL_TABLET | Freq: Two times a day (BID) | ORAL | Status: DC
  Administered 2024-01-15 – 2024-01-18 (×6): 8.6 mg via ORAL
  Filled 2024-01-15 (×7): qty 1

## 2024-01-15 MED ORDER — TRANEXAMIC ACID-NACL 1000-0.7 MG/100ML-% IV SOLN
INTRAVENOUS | Status: AC
  Filled 2024-01-15: qty 100

## 2024-01-15 MED ORDER — SUGAMMADEX SODIUM 200 MG/2ML IV SOLN
INTRAVENOUS | Status: DC | PRN
Start: 2024-01-15 — End: 2024-01-15
  Administered 2024-01-15: 80 mg via INTRAVENOUS

## 2024-01-15 MED ORDER — ACETAMINOPHEN 10 MG/ML IV SOLN: 500.0000 mg | Freq: Once | INTRAVENOUS | Status: DC | PRN

## 2024-01-15 MED ORDER — HYDROCODONE-ACETAMINOPHEN 5-325 MG PO TABS: 1.0000 | ORAL_TABLET | ORAL | 0 refills | Status: AC | PRN

## 2024-01-15 MED ORDER — AMISULPRIDE (ANTIEMETIC) 5 MG/2ML IV SOLN: 10.0000 mg | Freq: Once | INTRAVENOUS | Status: DC | PRN

## 2024-01-15 MED ORDER — KETOROLAC TROMETHAMINE 30 MG/ML IJ SOLN
INTRAMUSCULAR | Status: AC
  Filled 2024-01-15: qty 1

## 2024-01-15 MED ORDER — ACETAMINOPHEN 10 MG/ML IV SOLN
INTRAVENOUS | Status: AC
  Filled 2024-01-15: qty 100

## 2024-01-15 MED ORDER — ALUM & MAG HYDROXIDE-SIMETH 200-200-20 MG/5ML PO SUSP: 30.0000 mL | ORAL | Status: DC | PRN

## 2024-01-15 MED ORDER — ONDANSETRON HCL 4 MG/2ML IJ SOLN
INTRAMUSCULAR | Status: DC | PRN
  Administered 2024-01-15: 4 mg via INTRAVENOUS

## 2024-01-15 MED ORDER — POTASSIUM CHLORIDE CRYS ER 20 MEQ PO TBCR
40.0000 meq | EXTENDED_RELEASE_TABLET | ORAL | Status: AC
  Administered 2024-01-15 (×2): 40 meq via ORAL
  Filled 2024-01-15 (×2): qty 2

## 2024-01-15 MED ORDER — SODIUM CHLORIDE 0.9 % IR SOLN
Status: DC | PRN
  Administered 2024-01-15: 3000 mL

## 2024-01-15 MED ORDER — ACETAMINOPHEN 325 MG PO TABS
325.0000 mg | ORAL_TABLET | Freq: Four times a day (QID) | ORAL | Status: DC | PRN
  Administered 2024-01-16 – 2024-01-17 (×2): 650 mg via ORAL
  Filled 2024-01-15 (×2): qty 2

## 2024-01-15 MED ORDER — CEFAZOLIN SODIUM-DEXTROSE 2-4 GM/100ML-% IV SOLN
INTRAVENOUS | Status: AC
  Filled 2024-01-15: qty 100

## 2024-01-15 MED ORDER — DIPHENHYDRAMINE HCL 12.5 MG/5ML PO ELIX
12.5000 mg | ORAL_SOLUTION | ORAL | Status: DC | PRN
  Administered 2024-01-17: 25 mg via ORAL
  Filled 2024-01-15: qty 10

## 2024-01-15 MED ORDER — FENTANYL CITRATE (PF) 100 MCG/2ML IJ SOLN
INTRAMUSCULAR | Status: AC
  Filled 2024-01-15: qty 2

## 2024-01-15 MED ORDER — HYDROMORPHONE HCL 1 MG/ML IJ SOLN
INTRAMUSCULAR | Status: DC | PRN
  Administered 2024-01-15: .2 mg via INTRAVENOUS

## 2024-01-15 MED ORDER — ASPIRIN 81 MG PO CHEW
81.0000 mg | CHEWABLE_TABLET | Freq: Two times a day (BID) | ORAL | Status: DC
  Administered 2024-01-15 – 2024-01-18 (×6): 81 mg via ORAL
  Filled 2024-01-15 (×6): qty 1

## 2024-01-15 MED ORDER — LIDOCAINE HCL (CARDIAC) PF 100 MG/5ML IV SOSY
PREFILLED_SYRINGE | INTRAVENOUS | Status: DC | PRN
  Administered 2024-01-15: 80 mg via INTRAVENOUS

## 2024-01-15 MED ORDER — POVIDONE-IODINE 10 % EX SWAB: 2.0000 | Freq: Once | CUTANEOUS | Status: DC

## 2024-01-15 MED ORDER — METOCLOPRAMIDE HCL 5 MG/ML IJ SOLN: 5.0000 mg | Freq: Three times a day (TID) | INTRAMUSCULAR | Status: DC | PRN

## 2024-01-15 MED ORDER — METHOCARBAMOL 1000 MG/10ML IJ SOLN: 500.0000 mg | Freq: Four times a day (QID) | INTRAMUSCULAR | Status: DC | PRN

## 2024-01-15 MED ORDER — HYDROCODONE-ACETAMINOPHEN 5-325 MG PO TABS
1.0000 | ORAL_TABLET | ORAL | Status: DC | PRN
Start: 2024-01-15 — End: 2024-01-18
  Administered 2024-01-16 – 2024-01-18 (×2): 1 via ORAL
  Filled 2024-01-15 (×2): qty 1

## 2024-01-15 MED ORDER — OXYCODONE HCL 5 MG PO TABS: 5.0000 mg | ORAL_TABLET | Freq: Once | ORAL | Status: DC | PRN

## 2024-01-15 MED ORDER — PHENOL 1.4 % MT LIQD: 1.0000 | OROMUCOSAL | Status: DC | PRN

## 2024-01-15 MED ORDER — TRANEXAMIC ACID-NACL 1000-0.7 MG/100ML-% IV SOLN
1000.0000 mg | INTRAVENOUS | Status: AC
  Administered 2024-01-15: 1000 mg via INTRAVENOUS

## 2024-01-15 MED ORDER — PROPOFOL 10 MG/ML IV BOLUS
INTRAVENOUS | Status: DC | PRN
  Administered 2024-01-15: 50 mg via INTRAVENOUS
  Administered 2024-01-15: 20 mg via INTRAVENOUS
  Administered 2024-01-15: 30 mg via INTRAVENOUS

## 2024-01-15 MED ORDER — ACETAMINOPHEN 500 MG PO TABS
500.0000 mg | ORAL_TABLET | Freq: Four times a day (QID) | ORAL | Status: AC
  Administered 2024-01-15 – 2024-01-16 (×4): 500 mg via ORAL
  Filled 2024-01-15 (×4): qty 1

## 2024-01-15 MED ORDER — ROCURONIUM BROMIDE 100 MG/10ML IV SOLN
INTRAVENOUS | Status: DC | PRN
  Administered 2024-01-15: 40 mg via INTRAVENOUS

## 2024-01-15 MED ORDER — VANCOMYCIN HCL 1000 MG IV SOLR
INTRAVENOUS | Status: AC
  Filled 2024-01-15: qty 20

## 2024-01-15 MED ORDER — PHENYLEPHRINE 80 MCG/ML (10ML) SYRINGE FOR IV PUSH (FOR BLOOD PRESSURE SUPPORT)
PREFILLED_SYRINGE | INTRAVENOUS | Status: DC | PRN
  Administered 2024-01-15 (×3): 80 ug via INTRAVENOUS

## 2024-01-15 MED ORDER — SODIUM CHLORIDE (PF) 0.9 % IJ SOLN
INTRAMUSCULAR | Status: DC | PRN
  Administered 2024-01-15: 61 mL

## 2024-01-15 MED ORDER — METHOCARBAMOL 500 MG PO TABS
500.0000 mg | ORAL_TABLET | Freq: Four times a day (QID) | ORAL | Status: DC | PRN
  Administered 2024-01-15 – 2024-01-17 (×4): 500 mg via ORAL
  Filled 2024-01-15 (×4): qty 1

## 2024-01-15 MED ORDER — FENTANYL CITRATE PF 50 MCG/ML IJ SOSY: 25.0000 ug | PREFILLED_SYRINGE | INTRAMUSCULAR | Status: DC | PRN

## 2024-01-15 MED ORDER — 0.9 % SODIUM CHLORIDE (POUR BTL) OPTIME
TOPICAL | Status: DC | PRN
Start: 2024-01-15 — End: 2024-01-15
  Administered 2024-01-15: 1000 mL

## 2024-01-15 MED ORDER — PROPOFOL 500 MG/50ML IV EMUL
INTRAVENOUS | Status: DC | PRN
  Administered 2024-01-15: 75 ug/kg/min via INTRAVENOUS

## 2024-01-15 MED ORDER — ONDANSETRON HCL 4 MG PO TABS: 4.0000 mg | ORAL_TABLET | Freq: Four times a day (QID) | ORAL | Status: DC | PRN

## 2024-01-15 MED ORDER — HYDROCODONE-ACETAMINOPHEN 7.5-325 MG PO TABS
1.0000 | ORAL_TABLET | ORAL | Status: DC | PRN
  Administered 2024-01-15 – 2024-01-16 (×2): 1 via ORAL
  Filled 2024-01-15 (×2): qty 1

## 2024-01-15 MED ORDER — FENTANYL CITRATE (PF) 100 MCG/2ML IJ SOLN
INTRAMUSCULAR | Status: DC | PRN
Start: 2024-01-15 — End: 2024-01-15
  Administered 2024-01-15 (×2): 50 ug via INTRAVENOUS

## 2024-01-15 MED ORDER — ACETAMINOPHEN 10 MG/ML IV SOLN
INTRAVENOUS | Status: DC | PRN
  Administered 2024-01-15: 500 mg via INTRAVENOUS

## 2024-01-15 MED ORDER — METOCLOPRAMIDE HCL 5 MG PO TABS: 5.0000 mg | ORAL_TABLET | Freq: Three times a day (TID) | ORAL | Status: DC | PRN

## 2024-01-15 MED ORDER — ONDANSETRON HCL 4 MG/2ML IJ SOLN: 4.0000 mg | Freq: Four times a day (QID) | INTRAMUSCULAR | Status: DC | PRN

## 2024-01-15 SURGICAL SUPPLY — 45 items
BAG COUNTER SPONGE SURGICOUNT (BAG) ×1 IMPLANT
BLADE CLIPPER SURG (BLADE) IMPLANT
CHLORAPREP W/TINT 26 (MISCELLANEOUS) ×1 IMPLANT
COVER SURGICAL LIGHT HANDLE (MISCELLANEOUS) ×1 IMPLANT
CUP ACETAB RINGLOC 28X43 (Cup) IMPLANT
DERMABOND ADVANCED .7 DNX12 (GAUZE/BANDAGES/DRESSINGS) ×2 IMPLANT
DRAPE IMP U-DRAPE 54X76 (DRAPES) ×1 IMPLANT
DRAPE SHEET LG 3/4 BI-LAMINATE (DRAPES) ×2 IMPLANT
DRAPE STERI IOBAN 125X83 (DRAPES) ×1 IMPLANT
DRAPE U-SHAPE 47X51 STRL (DRAPES) ×2 IMPLANT
DRSG AQUACEL AG ADV 3.5X 6 (GAUZE/BANDAGES/DRESSINGS) IMPLANT
DRSG AQUACEL AG ADV 3.5X10 (GAUZE/BANDAGES/DRESSINGS) ×1 IMPLANT
ELECT REM PT RETURN 15FT ADLT (MISCELLANEOUS) ×1 IMPLANT
EVACUATOR 1/8 PVC DRAIN (DRAIN) IMPLANT
GLOVE BIO SURGEON STRL SZ7 (GLOVE) ×1 IMPLANT
GLOVE BIO SURGEON STRL SZ8.5 (GLOVE) ×2 IMPLANT
GLOVE BIOGEL PI IND STRL 7.5 (GLOVE) ×1 IMPLANT
GLOVE BIOGEL PI IND STRL 8.5 (GLOVE) ×1 IMPLANT
GOWN STRL REUS W/ TWL XL LVL3 (GOWN DISPOSABLE) ×1 IMPLANT
GOWN STRL REUS W/TWL 2XL LVL3 (GOWN DISPOSABLE) ×1 IMPLANT
HEAD MOD COCR 28MM HD -6MM NK (Orthopedic Implant) IMPLANT
HOOD PEEL AWAY T7 (MISCELLANEOUS) ×3 IMPLANT
KIT TURNOVER KIT A (KITS) IMPLANT
MANIFOLD NEPTUNE II (INSTRUMENTS) ×1 IMPLANT
MARKER SKIN DUAL TIP RULER LAB (MISCELLANEOUS) ×1 IMPLANT
NDL SPNL 18GX3.5 QUINCKE PK (NEEDLE) ×1 IMPLANT
NEEDLE SPNL 18GX3.5 QUINCKE PK (NEEDLE) ×1 IMPLANT
NS IRRIG 1000ML POUR BTL (IV SOLUTION) ×1 IMPLANT
PACK ANTERIOR HIP CUSTOM (KITS) ×1 IMPLANT
PENCIL SMOKE EVACUATOR (MISCELLANEOUS) IMPLANT
SAW OSC TIP CART 19.5X105X1.3 (SAW) ×1 IMPLANT
SEALER BIPOLAR AQUA 6.0 (INSTRUMENTS) ×1 IMPLANT
SET HNDPC FAN SPRY TIP SCT (DISPOSABLE) ×1 IMPLANT
SOLUTION PRONTOSAN WOUND 350ML (IRRIGATION / IRRIGATOR) ×1 IMPLANT
STEM FEM CMTLS 13 146 (Stem) IMPLANT
SUT MNCRL AB 3-0 PS2 18 (SUTURE) ×1 IMPLANT
SUT MON AB 2-0 CT1 36 (SUTURE) ×1 IMPLANT
SUT STRATAFIX 14 PDO 48 VLT (SUTURE) ×1 IMPLANT
SUT STRATAFIX PDO 1 14 VIOLET (SUTURE) ×1 IMPLANT
SUT VIC AB 1 CT1 27XBRD ANTBC (SUTURE) ×1 IMPLANT
SUT VIC AB 2-0 CT1 TAPERPNT 27 (SUTURE) ×1 IMPLANT
TOWEL GREEN STERILE FF (TOWEL DISPOSABLE) ×1 IMPLANT
TRAY FOLEY MTR SLVR 16FR STAT (SET/KITS/TRAYS/PACK) IMPLANT
TUBE SUCTION HIGH CAP CLEAR NV (SUCTIONS) ×1 IMPLANT
WATER STERILE IRR 1000ML POUR (IV SOLUTION) ×2 IMPLANT

## 2024-01-15 NOTE — Discharge Instructions (Signed)

## 2024-01-15 NOTE — Anesthesia Postprocedure Evaluation (Signed)
 Anesthesia Post Note  Patient: [name]  Procedure(s) Performed: ANTERIOR APPROACH HEMI HIP ARTHROPLASTY (Left: Hip)     Patient location during evaluation: PACU Anesthesia Type: General Level of consciousness: awake Pain management: pain level controlled Vital Signs Assessment: post-procedure vital signs reviewed and stable Respiratory status: spontaneous breathing, nonlabored ventilation and respiratory function stable Cardiovascular status: blood pressure returned to baseline and stable Postop Assessment: no apparent nausea or vomiting Anesthetic complications: no   No notable events documented.  Last Vitals:  Vitals:   01/15/24 1030 01/15/24 1045  BP: (!) 176/101 (!) 160/103  Pulse: 76 72  Resp: 11 11  Temp: 36.4 C   SpO2: 97% 100%    Last Pain:  Vitals:   01/15/24 1045  TempSrc:   PainSc: 0-No pain                 Linton Rump

## 2024-01-15 NOTE — Progress Notes (Signed)
 [facility]  PROGRESS NOTE  [name] GNF:[id number] DOB: Mar 14, 1941 DOA: 01/14/2024 PCP: System, Provider Not In   Brief HPI:   83 y.o. female with medical history significant of BOOP, gastroparesis, GERD, migraines, IDA, pulmonary fibrosis, SIADH, hx of TIA, eating disorder who presented to ED after a mechanical fall. She is on home hospice for FTT. She tripped on her walker and fell right onto her left hip. Caregiver was there, but fall was unwitnessed.  This is her 3rd significant fall since August. She was supposed to be moving to [place] today in [place], but bring in nursing care. She states she was walking and her leg just gave out. She fell down and does states she hit her head pretty hard on the door. She had immediate pain in the hip.  Left femur xray: nondisplaced left femoral neck fracture.    Assessment/Plan:   Fracture of femoral neck, left (HCC) 83 year old female with history of mechanical fall and subsequent left femoral neck fracture who is on hospice for FTT and bronchiolectasis.  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  Followed by pulmonology  flutter valve, symbicort, spiriva, albuterol, levalbuterol and hypertonic saline caused palpitations and dizziness - she is too frail to undergo bronchoscopy - now on hospice    Iron deficiency anemia Stable    Eating disorder Life long eating disorder per family.  Does not eat much of anything  On hospice Check vitamin D, prealbumin Nutrition consult with surgery  Wound  healing/recovery will be very difficult. Family aware         Medications     [MAR Hold] bisacodyl  5 mg Oral QHS   [MAR Hold] pantoprazole  40 mg Oral Daily   povidone-iodine  2 Application Topical Once   povidone-iodine  2 Application Topical Once   [MAR Hold] sucralfate  1 g Oral See admin instructions     Data Reviewed:   CBG:  No results for input(s): "GLUCAP" in the last 168 hours.  SpO2: 98 % O2 Flow Rate (L/min): 3 L/min    Vitals:   01/15/24 0946 01/15/24 1002 01/15/24 1006 01/15/24 1016  BP: (!) 161/87 (!) 177/100 (!) 162/100 (!) 162/107  Pulse:   76   Resp: 12 12 10 15   Temp: 97.9 F (36.6 C)     TempSrc:      SpO2: 100% 100% 98% 98%  Weight:      Height:          Data Reviewed:  Basic Metabolic Panel: Recent Labs  Lab 01/14/24 1420 01/15/24 0351  NA 127* 128*  K 4.0 3.2*  CL 91* 92*  CO2 27 25  GLUCOSE 98 215*  BUN 8 6*  CREATININE 0.35* 0.45  CALCIUM 9.1 8.7*    CBC: Recent Labs  Lab 01/14/24 1420 01/15/24 0351  WBC 9.3 10.9*  HGB 12.2 12.7  HCT 37.1 40.6  MCV 89.4 93.1  PLT 378 349    LFT No results for input(s): "AST", "ALT", "ALKPHOS", "BILITOT", "PROT", "ALBUMIN" in the last 168 hours.   Antibiotics: Anti-infectives (From admission, onward)    Start     Dose/Rate Route Frequency  Ordered Stop   01/15/24 0800  amoxicillin-clavulanate (AUGMENTIN) 875-125 MG per tablet 1 tablet  Status:  Discontinued       Note to Pharmacy: 21 day course   1 tablet Oral 2 times daily with meals 01/14/24 1830 01/14/24 1912   01/15/24 0745  ceFAZolin (ANCEF) IVPB 2g/100 mL premix        2 g 200 mL/hr over 30 Minutes Intravenous On call to O.R. 01/15/24 0645 01/15/24 0814   01/15/24 0647  ceFAZolin (ANCEF) 2-4 GM/100ML-% IVPB       Note to Pharmacy: Myrlene Broker M: cabinet override      01/15/24 0647 01/15/24 0814        DVT prophylaxis: As per orthopedics  Code Status: DNR  Family Communication:    CONSULTS  orthopedics   Subjective   Patient seen after surgery.  Denies pain   Objective    Physical Examination:   General: Appears in no acute distress Cardiovascular: S1-S2, regular Respiratory: Lungs clear to auscultation bilaterally Abdomen: Soft, nontender, no organomegaly Extremities: No edema in the lower extremities Neurologic: Somnolent but arousable   Status is: Inpatient:             Meredeth Ide   [facility] If 7PM-7AM, please contact night-coverage at [email or web address], Office  [phone]   01/15/2024, 10:23 AM  LOS: 1 day

## 2024-01-15 NOTE — Interval H&P Note (Signed)
 History and Physical Interval Note:  01/15/2024 7:22 AM  [name]  has presented today for surgery, with the diagnosis of Left Femoral Neck Fracture.  The various methods of treatment have been discussed with the patient and family. After consideration of risks, benefits and other options for treatment, the patient has consented to  Procedure(s): ANTERIOR APPROACH HEMI HIP ARTHROPLASTY (Left) as a surgical intervention.  The patient's history has been reviewed, patient examined, no change in status, stable for surgery.  I have reviewed the patient's chart and labs.  Questions were answered to the patient's satisfaction.     [name]

## 2024-01-15 NOTE — Anesthesia Procedure Notes (Signed)
 Procedure Name: Intubation Date/Time: 01/15/2024 8:15 AM  Performed by: Deri Fuelling, CRNAPre-anesthesia Checklist: Patient identified, Emergency Drugs available, Suction available and Patient being monitored Patient Re-evaluated:Patient Re-evaluated prior to induction Oxygen Delivery Method: Circle system utilized Preoxygenation: Pre-oxygenation with 100% oxygen Induction Type: IV induction Ventilation: Mask ventilation without difficulty Laryngoscope Size: Glidescope and 3 Grade View: Grade I Tube type: Oral Tube size: 7.0 mm Number of attempts: 1 Airway Equipment and Method: Stylet and Oral airway Placement Confirmation: ETT inserted through vocal cords under direct vision, positive ETCO2 and breath sounds checked- equal and bilateral Secured at: 22 cm Tube secured with: Tape Dental Injury: Teeth and Oropharynx as per pre-operative assessment

## 2024-01-15 NOTE — Transfer of Care (Signed)
 Immediate Anesthesia Transfer of Care Note  Patient: [name]  Procedure(s) Performed: ANTERIOR APPROACH HEMI HIP ARTHROPLASTY (Left: Hip)  Patient Location: PACU  Anesthesia Type:General  Level of Consciousness: drowsy and patient cooperative  Airway & Oxygen Therapy: Patient Spontanous Breathing and Patient connected to face mask oxygen  Post-op Assessment: Report given to RN and Post -op Vital signs reviewed and stable  Post vital signs: Reviewed and stable  Last Vitals:  Vitals Value Taken Time  BP 161/87 01/15/24 0946  Temp    Pulse    Resp 9 01/15/24 0947  SpO2    Vitals shown include unfiled device data.  Last Pain:  Vitals:   01/15/24 0700  TempSrc:   PainSc: 8          Complications: No notable events documented.

## 2024-01-15 NOTE — Anesthesia Preprocedure Evaluation (Addendum)
 Anesthesia Evaluation  Patient identified by MRN, date of birth, ID band Patient awake    Reviewed: Allergy & Precautions, NPO status , Patient's Chart, lab work & pertinent test results  History of Anesthesia Complications Negative for: history of anesthetic complications  Airway Mallampati: III  TM Distance: >3 FB Neck ROM: Full    Dental  (+) Dental Advisory Given,    Pulmonary neg shortness of breath, neg sleep apnea, pneumonia (h/o recurrent aspiration pneumonia), neg recent URI, former smoker Bronchiectasis, pulmonary fibrosis, BOOP   Pulmonary exam normal breath sounds clear to auscultation       Cardiovascular (-) hypertension(-) Past MI, (-) Cardiac Stents and (-) CABG (-) dysrhythmias + Valvular Problems/Murmurs (mild MR and TR)  Rhythm:Regular Rate:Normal  Raynaud's  TTE 10/08/2019: IMPRESSIONS    1. Left ventricular ejection fraction, by visual estimation, is 60 to  65%. The left ventricle has normal function. Left ventricular septal wall  thickness was mildly increased. Mildly increased left ventricular  posterior wall thickness. There is mildly  increased left ventricular hypertrophy.   2. Left ventricular diastolic parameters are consistent with Grade II  diastolic dysfunction (pseudonormalization).   3. Global right ventricle has normal systolic function.The right  ventricular size is normal. No increase in right ventricular wall  thickness.   4. Left atrial size was normal.   5. Right atrial size was normal.   6. The mitral valve is normal in structure. Mild mitral valve  regurgitation. No evidence of mitral stenosis.   7. The tricuspid valve is normal in structure. Tricuspid valve  regurgitation is mild.   8. The aortic valve is tricuspid. Aortic valve regurgitation is not  visualized. No evidence of aortic valve sclerosis or stenosis.   9. The pulmonic valve was normal in structure. Pulmonic valve   regurgitation is not visualized.  10. Normal pulmonary artery systolic pressure.  11. The inferior vena cava is normal in size with greater than 50%  respiratory variability, suggesting right atrial pressure of 3 mmHg.     Neuro/Psych  Headaches, neg Seizures PSYCHIATRIC DISORDERS      TIA Neuromuscular disease (neuropathy)    GI/Hepatic Neg liver ROS, hiatal hernia,GERD  Medicated,,Gastroparesis    Endo/Other  neg diabetes  SIADH  Renal/GU negative Renal ROS     Musculoskeletal  (+) Arthritis ,  Osteopenia    Abdominal   Peds  Hematology  (+) Blood dyscrasia, anemia Lab Results      Component                Value               Date                      WBC                      10.9 (H)            01/15/2024                HGB                      12.7                01/15/2024                HCT  40.6                01/15/2024                MCV                      93.1                01/15/2024                PLT                      349                 01/15/2024              Anesthesia Other Findings On home hospice for FTT  Reproductive/Obstetrics                              Anesthesia Physical Anesthesia Plan  ASA: 3  Anesthesia Plan: General   Post-op Pain Management: Ofirmev IV (intra-op)*   Induction: Intravenous  PONV Risk Score and Plan: 3 and Ondansetron, Dexamethasone and Treatment may vary due to age or medical condition  Airway Management Planned: Oral ETT  Additional Equipment:   Intra-op Plan:   Post-operative Plan: Extubation in OR  Informed Consent:    Patient has DNR.  Discussed DNR with patient and Suspend DNR.   Dental advisory given  Plan Discussed with: CRNA and Anesthesiologist  Anesthesia Plan Comments: (Risks of general anesthesia discussed including, but not limited to, sore throat, hoarse voice, chipped/damaged teeth, injury to vocal cords, nausea and vomiting, allergic  reactions, lung infection, heart attack, stroke, and death. All questions answered. )         Anesthesia Quick Evaluation

## 2024-01-15 NOTE — Progress Notes (Signed)
 [name] Room 1334 [facility] Hospitalized Hospice Patient Visit    [name] is a current hospice patient with a terminal diagnosis of  Severe Protein Calorie Malnutrition. Family alerted [facility] that patient had fallen and had been taken to [facility] and had suspected fracture. Patient was admitted to [facility] on 01/14/24 with diagnosis of left fracture of femoral neck. Per Dr. Gordy Savers with [facility], this is a related hospital admission.    Visited patient at bedside today. She had left hip arthroplasty this morning. She was surrounded by family and appeared to be in good spirits. Daughter arrived shortly after. They both had questions regarding next steps. Inquired about what would happen if patient needed to be discharged to rehab facility for physical therapy. Spoke with family regarding hospice benefit and if decision was made to go to inpatient rehab for skilled therapy, at that time we could assist with revocation of hospice benefit. Encouraged family as well as patient to wait until hearing back from provider regarding recommendations of next steps before making decisions now, as it is too early. Daughter and patient verbalized understanding.     Patient is GIP appropriate due to surgical management and evaluation of fractured hip .    Vital Signs:  98.5/92/16     118/96   97% on RA   I&O 997/225   Abnormal labs  01/14/24 14:20 Sodium: 127 (L) Chloride: 91 (L) Creatinine: 0.35 (L)  01/15/24 03:51 Sodium: 128 (L) Potassium: 3.2 (L) Chloride: 92 (L) Glucose: 215 (H) BUN: 6 (L) Calcium: 8.7 (L)     Diagnostics:   Narrative & Impression CLINICAL DATA:  Pain after fall.   EXAM:LEFT FEMUR 2 VIEWS; PELVIS - 1-2 VIEW   COMPARISON:  None Available.   FINDINGS: Femur: Nondisplaced femoral neck fracture. Distal femur is intact. The bones are subjectively under mineralized. Knee alignment is maintained.   Pelvis: No  additional fracture of the pelvis. Pubic rami are intact. Pubic symphysis and sacroiliac joints are congruent. The bones are subjectively under mineralized. Mild bilateral hip osteoarthritis.   IMPRESSION: 1. Nondisplaced left femoral neck fracture. 2. No additional fracture of the pelvis.     Electronically Signed   By: Narda Rutherford M.D.   On: 01/14/2024 16:02    Narrative & Impression CLINICAL DATA:  Head trauma, moderate to severe. Mechanical fall. Bleeding from the upper lip.   EXAM:CT HEAD WITHOUT CONTRAST   TECHNIQUE: Contiguous axial images were obtained from the base of the skull through the vertex without intravenous contrast.   RADIATION DOSE REDUCTION: This exam was performed according to the departmental dose-optimization program which includes automated exposure control, adjustment of the mA and/or kV according to patient size and/or use of iterative reconstruction technique.   COMPARISON:  CT head without contrast 11/26/2023   FINDINGS: Brain: No acute infarct, hemorrhage, or mass lesion is present. No significant white matter lesions are present. Deep brain nuclei are within normal limits. The ventricles are of normal size. No significant extraaxial fluid collection is present.   The brainstem and cerebellum are within normal limits. Midline structures are within normal limits.   Vascular: No hyperdense vessel or unexpected calcification.   Skull: Calvarium is intact. No focal lytic or blastic lesions are present. No significant extracranial soft tissue lesion is present.   Sinuses/Orbits: The paranasal sinuses and mastoid air cells are clear. The globes and orbits are within normal limits.   IMPRESSION: Normal CT appearance of the brain.  Electronically Signed   By: Marin Roberts M.D.   On: 01/14/2024 19:41  Narrative & Impression CLINICAL DATA:  Left hip pain following fall with known femoral neck fracture, initial encounter    EXAM:CT OF THE LEFT HIP WITHOUT CONTRAST   TECHNIQUE: Multidetector CT imaging of the left hip was performed according to the standard protocol. Multiplanar CT image reconstructions were also generated.   RADIATION DOSE REDUCTION: This exam was performed according to the departmental dose-optimization program which includes automated exposure control, adjustment of the mA and/or kV according to patient size and/or use of iterative reconstruction technique.   COMPARISON:  Plain films from earlier in the same day.   FINDINGS: Bones/Joint/Cartilage   Subcapital left femoral neck fracture is noted with mild impaction at the fracture site. No dislocation is noted. No other fracture is seen. Mild left hip joint effusion is noted.   Ligaments   Suboptimally assessed by CT.   Muscles and Tendons   Surrounding musculature appears within normal limits.   Soft tissues   Subcapital left femoral neck fracture with mild impaction at the fracture site. Associated joint effusion is noted.     Electronically Signed   By: Alcide Clever M.D.   On: 01/14/2024 19:50      Narrative & Impression CLINICAL DATA:  Elective surgery.   EXAM:DG HIP (WITH OR WITHOUT PELVIS) 1V*L*   COMPARISON:  Preoperative imaging   FINDINGS: Four fluoroscopic spot views of the pelvis and left hip obtained in the operating room. Sequential images during hip arthroplasty. Fluoroscopy time 4 seconds. Dose 0.26 mGy.   IMPRESSION: Intraoperative fluoroscopy during left hip arthroplasty.     Electronically Signed   By: Narda Rutherford M.D.   On: 01/15/2024 10:39     Narrative & Impression CLINICAL DATA:  Postop.   EXAM:PORTABLE PELVIS 1-2 VIEWS   COMPARISON:  Preoperative imaging   FINDINGS: Left hip arthroplasty in expected alignment. No periprosthetic lucency or fracture. Recent postsurgical change includes air and edema in the soft tissues. Overlying skin staples in place.    IMPRESSION: Left hip arthroplasty without immediate postoperative complication.     Electronically Signed   By: Narda Rutherford M.D.   On: 01/15/2024 10:39   IV/PRN Meds Fentanyl IV x1, Dilaudid 0.5mg  IV x1, Toradol 30mg  IV x1, Zofran 4mg  IV x1, Ofirmev 10 MG/ML IV  x1, Ancef 2g IV x1, Morphine 0.5mg  IV x3,    Problem list as per MD note Meredeth Ide, MD 3.02.2025 Fracture of femoral neck, left (HCC) 83 year old female with history of mechanical fall and subsequent left femoral neck fracture who is on hospice for FTT and bronchiolectasis.  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  Followed by pulmonology  flutter valve, symbicort, spiriva, albuterol, levalbuterol and hypertonic saline caused palpitations and dizziness - she is too frail to undergo bronchoscopy - now on hospice    Iron deficiency anemia Stable    Eating disorder Life long eating disorder per family.  Does not eat much of anything  On hospice Check vitamin D, prealbumin Nutrition consult with surgery  Wound healing/recovery will be very difficult. Family aware  Discharge Planning: Ongoing.    Family contact: Patient is own spokesperson. Also spoke with daughter who arrived at bedside. Discussed what to anticipate regarding hospital stay as well as hospice benefit and revocation process.   IDT:  Updated      Goals of Care-  DNR Limited     Should patient need ambulance transfer at discharge- please use [facility]) as they contract this service for our  active hospice patients.      Roe Rutherford, BSN, RN Hospice Nurse Liaison  [phone]

## 2024-01-15 NOTE — Op Note (Signed)
 OPERATIVE REPORT  SURGEON: Samson Frederic, MD   ASSISTANT: Clint Bolder, PA-C  PREOPERATIVE DIAGNOSIS: Displaced Left femoral neck fracture.   POSTOPERATIVE DIAGNOSIS: Displaced Left femoral neck fracture.   PROCEDURE: Left hip hemiarthroplasty, anterior approach.   IMPLANTS: Biomet Taperloc Complete Reduced Distal stem, size 13 x 146 mm, high offset, with a 28 - 6 mm metal head ball and a 43 mm bipolar head ball.  ANESTHESIA:  General  ANTIBIOTICS: 2g ancef.  ESTIMATED BLOOD LOSS:-75 mL    DRAINS: None.  COMPLICATIONS: None   CONDITION: PACU - hemodynamically stable.   BRIEF CLINICAL NOTE: [name] is a 83 y.o. female with a displaced Left femoral neck fracture. The patient was admitted to the hospitalist service and underwent perioperative risk stratification and medical optimization. The risks, benefits, and alternatives to hemiarthroplasty were explained, and the patient elected to proceed.  PROCEDURE IN DETAIL: The patient was taken to the operating room and general anesthesia was induced on the hospital bed.  The patient was then positioned on the Hana table.  All bony prominences were well padded.  The hip was prepped and draped in the normal sterile surgical fashion.  A time-out was called verifying side and site of surgery. Antibiotics were given within 60 minutes of beginning the procedure.   Bikini incision was made, and the direct anterior approach to the hip was performed through the Hueter interval.  Superficial dissection was carried out lateral to the ASIS. Lateral femoral circumflex vessels were treated with the Auqumantys. The anterior capsule was exposed and an inverted T capsulotomy was made. Fracture hematoma was encountered and evacuated. The patient was found to have a comminuted Left subcapital femoral neck fracture.  Inferior pubofemoral ligament was released subperiosteally to the lesser trochanter. I freshened the femoral neck cut with a saw.  I removed the  femoral neck fragment.  A corkscrew was placed into the head and the head was removed.  This was passed to the back table and was measured.   Acetabular exposure was achieved.  I examined the articular cartilage which was intact.  The labrum was intact. A 43 mm trial head was placed and found to have excellent fit.   I then gained femoral exposure taking care to protect the abductors and greater trochanter.  The superior capsule was incised longitudinally, staying lateral to the posterior border of the femoral neck. External rotation, extension, and adduction were applied.  A cookie cutter was used to enter the femoral canal, and then the femoral canal finder was used to confirm location.  I then sequentially broached up to a size 13.  Calcar planer was used on the femoral neck remnant.  I placed a high offset neck and a 43 mm trial bipolar construct. The hip was reduced.  Leg lengths were checked fluoroscopically.  The hip was dislocated and trial components were removed.  I placed the real stem followed by the real spacer and head ball.  A single reduction maneuver was performed and the hip was reduced.  Fluoroscopy was used to confirm component position and leg lengths.  At 90 degrees of external rotation and extension, the hip was stable to an anterior directed force.   The wound was copiously irrigated with Prontosan solution and normal saline using pulse lavage.  Marcaine solution was injected into the periarticular soft tissue.  1g vancomycin powder was placed into the hip joint.  The wound was closed in layers using #1 Stratafix for the fascia, 2-0 Monocryl for the  deep dermal layer, and staples + Dermabond for the skin.  Once the glue was fully dried, an Aquacell Ag dressing was applied.  The patient was then awakened from anesthesia and transported to the recovery room in stable condition.  Sponge, needle, and instrument counts were correct at the end of the case x2.  The patient tolerated the  procedure well and there were no known complications.  Please note that a surgical assistant was a medical necessity for this procedure to perform it in a safe and expeditious manner. Assistant was necessary to provide appropriate retraction of vital neurovascular structures, to prevent femoral fracture, and to allow for anatomic placement of the prosthesis.

## 2024-01-16 ENCOUNTER — Encounter (HOSPITAL_COMMUNITY): Payer: Self-pay | Admitting: Orthopedic Surgery

## 2024-01-16 DIAGNOSIS — E222 Syndrome of inappropriate secretion of antidiuretic hormone: Secondary | ICD-10-CM | POA: Diagnosis not present

## 2024-01-16 DIAGNOSIS — S72002A Fracture of unspecified part of neck of left femur, initial encounter for closed fracture: Secondary | ICD-10-CM | POA: Diagnosis not present

## 2024-01-16 DIAGNOSIS — J479 Bronchiectasis, uncomplicated: Secondary | ICD-10-CM | POA: Diagnosis not present

## 2024-01-16 DIAGNOSIS — K219 Gastro-esophageal reflux disease without esophagitis: Secondary | ICD-10-CM | POA: Diagnosis not present

## 2024-01-16 LAB — BASIC METABOLIC PANEL
Anion gap: 11 (ref 5–15)
BUN: 10 mg/dL (ref 8–23)
CO2: 23 mmol/L (ref 22–32)
Calcium: 9 mg/dL (ref 8.9–10.3)
Chloride: 92 mmol/L — ABNORMAL LOW (ref 98–111)
Creatinine, Ser: 0.47 mg/dL (ref 0.44–1.00)
GFR, Estimated: >60 mL/min (ref >60)
Glucose, Bld: 143 mg/dL — ABNORMAL HIGH (ref 70–99)
Potassium: 4 mmol/L (ref 3.5–5.1)
Sodium: 126 mmol/L — ABNORMAL LOW (ref 135–145)

## 2024-01-16 LAB — CBC
HCT: 34.9 % — ABNORMAL LOW (ref 36.0–46.0)
Hemoglobin: 11 g/dL — ABNORMAL LOW (ref 12.0–15.0)
MCH: 29.4 pg (ref 26.0–34.0)
MCHC: 31.5 g/dL (ref 30.0–36.0)
MCV: 93.3 fL (ref 80.0–100.0)
Platelets: 374 10*3/uL (ref 150–400)
RBC: 3.74 MIL/uL — ABNORMAL LOW (ref 3.87–5.11)
RDW: 12.4 % (ref 11.5–15.5)
WBC: 10.8 10*3/uL — ABNORMAL HIGH (ref 4.0–10.5)
nRBC: 0 % (ref 0.0–0.2)

## 2024-01-16 MED ORDER — ADULT MULTIVITAMIN W/MINERALS CH
1.0000 | ORAL_TABLET | Freq: Every day | ORAL | Status: DC
  Administered 2024-01-16 – 2024-01-18 (×3): 1 via ORAL
  Filled 2024-01-16 (×3): qty 1

## 2024-01-16 NOTE — Progress Notes (Signed)
 Initial Nutrition Assessment  DOCUMENTATION CODES:   Underweight  INTERVENTION:   -Adamantly refuses protein supplements  -Multivitamin with minerals daily  -Placed order for dinner  -Family to bring food items pt will accept  NUTRITION DIAGNOSIS:   Inadequate oral intake related to chronic illness as evidenced by per patient/family report.  GOAL:   Patient will meet greater than or equal to 90% of their needs  MONITOR:   PO intake  REASON FOR ASSESSMENT:   Consult Hip fracture protocol  ASSESSMENT:   83 y.o. female with medical history significant of BOOP, gastroparesis, GERD, migraines, IDA, pulmonary fibrosis, SIADH, hx of TIA, eating disorder who presented to ED after a mechanical fall. She is on home hospice for FTT. She tripped on her walker and fell right onto her left hip. Admitted nondisplaced left femoral neck fracture.  Patient in room with multiple family members at bedside.  Pt very adamant that she only eats 1 meal a day and only eats certain food items. Not open to food suggestions or supplements even when encouraged to help surgical recovery. Had many excuses as to why she could not consume protein shakes. Pt gave food options she would eat for dinner, which included cottage cheese, apples, grapes, salad greens with tomatoes and cucumbers, boiled eggs. Family can bring frozen grapes for patient, pt states this gives her fluids as well (?). Pt to ask family to bring in boxes of hot tea brand she prefers. Very adamant about how much the family should bring to the hospital. Became a bit defensive.   Spoke with pt's family outside of room, they report years of disordered eating patterns. Pt has not had any improvement. Pt does not see a dietitian outpatient or any time in the past. Pt will only eat certain food items. They were not confident in pt accepting daily MVI but RD to still try.   Medications: Colace, Senokot  Labs reviewed: Low Na Vitamin D WNL    NUTRITION - FOCUSED PHYSICAL EXAM:  Deferred given disordered eating history, still exhibiting restrictive behaviors  Diet Order:   Diet Order             Diet regular Room service appropriate? Yes; Fluid consistency: Thin  Diet effective now                   EDUCATION NEEDS:   Education needs have been addressed  Skin:  Skin Assessment: Skin Integrity Issues: Skin Integrity Issues:: Incisions Incisions: 3/2 left hip  Last BM:  3/1  Height:   Ht Readings from Last 1 Encounters:  01/14/24 5\' 4"  (1.626 m)    Weight:   Wt Readings from Last 1 Encounters:  01/14/24 36 kg    BMI:  Body mass index is 13.62 kg/m.  Estimated Nutritional Needs:   Kcal:  1650-1850  Protein:  80-95g  Fluid:  1.8L/day  Tilda Franco, MS, RD, LDN Inpatient Clinical Dietitian Contact via Secure chat

## 2024-01-16 NOTE — Progress Notes (Signed)
 [id number] liaison note:   Patient revoked hospice services today in order to seek skilled rehab. We will continue to follow for outpatient palliative.   Thank you for the opportunity to participate in this patient's plan of care Thea Gist, BSN, [facility] liaison [phone]

## 2024-01-16 NOTE — Evaluation (Signed)
 Occupational Therapy Evaluation Patient Details Name: [name] MRN: [id number] DOB: 1941-08-15 Today's Date: 01/16/2024   History of Present Illness   [name] is an 83 y/o female admitted 01/14/24 from home with hospice for failure to thrive after a fall and dx of L femoral neck fracture. She is now s/p L anterior approach hemi hip arthroplasty 01/15/24. PMH includes Arthritis, Atherosclerosis of aorta (2019), Esophageal dysmotility, Esophageal ulcer, Gastroparesis, Hiatal hernia, Iron deficiency anemia, Macular degeneration (senile) of retina, Neuropathy, Nutcracker esophagus, Osteopenia, Pulmonary fibrosis, Raynaud's disease, Recurrent pneumonia, Thrombocytosis,  Breast lumpectomy; Total abdominal hysterectomy.     Clinical Impressions Pt is typically supervision/set up for mobility and ADL with her 24/7 caregivers. Today she presents with decreased access to LB for ADL, decreased balance, decreased strength, and overall is max A for LB ADL, min A for UB ADL, set up for grooming and self-feeding. Pt will benefit from skilled OT at the acute level as well as afterwards at the rehab of <3 hours daily to maximize safety and independence and return to PLOF.  Of note: Family states that they were in the process of moving Pt from living alone with caregiver to [facility] and they request that her rehab take place at that facility if possible.      If plan is discharge home, recommend the following:   A lot of help with walking and/or transfers;A lot of help with bathing/dressing/bathroom;Assistance with cooking/housework;Assist for transportation;Help with stairs or ramp for entrance     Functional Status Assessment   Patient has had a recent decline in their functional status and demonstrates the ability to make significant improvements in function in a reasonable and predictable amount of time.     Equipment Recommendations   None recommended by OT (Pt has appropriate DME)      Recommendations for Other Services   PT consult     Precautions/Restrictions   Precautions Precautions: Fall Restrictions Weight Bearing Restrictions Per Provider Order: Yes LLE Weight Bearing Per Provider Order: Weight bearing as tolerated     Mobility Bed Mobility Overal bed mobility: Needs Assistance Bed Mobility: Supine to Sit     Supine to sit: Min assist     General bed mobility comments: light min A to scoot forward; pt self assisted L LE with UE    Transfers Overall transfer level: Needs assistance Equipment used: Rolling walker (2 wheels), 1 person hand held assist Transfers: Sit to/from Stand, Bed to chair/wheelchair/BSC Sit to Stand: Min assist     Step pivot transfers: Mod assist     General transfer comment: Stood from bed x 2 and bsc x 1 with min A and cues for hand placement.  Initial pivot to bsc with RW but pt more anxious, leaning back, needing assist with RW.  For transfer back to bed did 1 person HHA with pt holding therapist arms and turned back to bed - pt more relaxed and less posterior lean      Balance Overall balance assessment: Needs assistance Sitting-balance support: No upper extremity supported Sitting balance-Leahy Scale: Good     Standing balance support: Bilateral upper extremity supported Standing balance-Leahy Scale: Poor Standing balance comment: RW and min A                           ADL either performed or assessed with clinical judgement   ADL Overall ADL's : Needs assistance/impaired Eating/Feeding: Modified independent   Grooming: Set up;Sitting;Wash/dry face;Wash/dry  hands   Upper Body Bathing: Moderate assistance   Lower Body Bathing: Maximal assistance   Upper Body Dressing : Minimal assistance   Lower Body Dressing: Maximal assistance;Sit to/from stand;+2 for safety/equipment   Toilet Transfer: Moderate assistance;+2 for safety/equipment;Rolling walker (2 wheels);BSC/3in1 Toilet Transfer  Details (indicate cue type and reason): SPT to [facility] - used to Rollator needed help navigating RW Toileting- Clothing Manipulation and Hygiene: Set up;Sitting/lateral lean       Functional mobility during ADLs: Moderate assistance;+2 for safety/equipment;Rolling walker (2 wheels);Cueing for safety;Cueing for sequencing General ADL Comments: decreased access to LB, increased assist with transfers, decreased balance and activity tolerance     Vision Baseline Vision/History: 1 Wears glasses Ability to See in Adequate Light: 0 Adequate Patient Visual Report: No change from baseline Vision Assessment?: No apparent visual deficits     Perception         Praxis         Pertinent Vitals/Pain Pain Assessment Pain Assessment: 0-10 Pain Score: 10-Worst pain ever Pain Location: L hip Pain Descriptors / Indicators: Discomfort (reports at 10/10 but no signs of severe pain) Pain Intervention(s): Monitored during session, Repositioned     Extremity/Trunk Assessment Upper Extremity Assessment Upper Extremity Assessment: Generalized weakness   Lower Extremity Assessment Lower Extremity Assessment: Defer to PT evaluation RLE Deficits / Details: ROM WFL; Appears to have at least 3/5 strength throughout, did not further test LLE Deficits / Details: ROM WFL (excellent for POD #1); MMT: at least 3/5 but did not further test due to pain   Cervical / Trunk Assessment Cervical / Trunk Assessment: Normal   Communication Communication Communication: No apparent difficulties   Cognition Arousal: Alert Behavior During Therapy: Anxious Cognition: Cognition impaired     Awareness: Intellectual awareness intact, Online awareness impaired Memory impairment (select all impairments): Working memory Attention impairment (select first level of impairment): Selective attention Executive functioning impairment (select all impairments): Problem solving OT - Cognition Comments: Pt was anxious about  moving, verbalized fear, but also wanted to participate - often would contradict herself but very motivated and wants to participate and overcome her anxiety                 Following commands: Intact       Cueing  General Comments   Cueing Techniques: Verbal cues;Tactile cues;Visual cues  Pt saying L hip incision itching - pull up her gown and easily lifted the surgical dressing. Erythema surronding dressing and surgical site. Made nursing aware that dressing is not intact. Pt with multiple family members present, including son, expressed that she was in process of moving into [facility] IL[facility] and feel that SNF would be best option for her to get level of care she needs.   Exercises     Shoulder Instructions      Home Living Family/patient expects to be discharged to:: Private residence Living Arrangements: Alone Available Help at Discharge: Personal care attendant;Available 24 hours/day Type of Home: Other(Comment) (townhouse) Home Access: Level entry     Home Layout: One level     Bathroom Shower/Tub: Producer, television/film/video: Handicapped height     Home Equipment: Shower seat;Grab bars - tub/shower;Rolling Environmental consultant (2 wheels);Rollator (4 wheels);Cane - single point;BSC/3in1   Additional Comments: She was at home with hospice and 24/7 caregivers      Prior Functioning/Environment Prior Level of Function : Needs assist             Mobility Comments: Walks in home with  rollator; reports falls - feels like legs give away ADLs Comments: Pt performs dressing and toileting with supervision; has some assist with showers; has assist with IADLs    OT Problem List: Decreased strength;Decreased range of motion;Decreased activity tolerance;Impaired balance (sitting and/or standing);Decreased safety awareness;Decreased knowledge of use of DME or AE;Pain   OT Treatment/Interventions: Self-care/ADL training;DME and/or AE instruction;Therapeutic  activities;Patient/family education;Balance training      OT Goals(Current goals can be found in the care plan section)   Acute Rehab OT Goals Patient Stated Goal: be as independent as possible OT Goal Formulation: With patient/family Time For Goal Achievement: 01/30/24 Potential to Achieve Goals: Good ADL Goals Pt Will Perform Grooming: with contact guard assist;standing Pt Will Perform Upper Body Dressing: with set-up;sitting Pt Will Perform Lower Body Dressing: with contact guard assist;with adaptive equipment;sit to/from stand Pt Will Transfer to Toilet: with supervision;ambulating Pt Will Perform Toileting - Clothing Manipulation and hygiene: with supervision;sit to/from stand   OT Frequency:  Min 1X/week    Co-evaluation PT/OT/SLP Co-Evaluation/Treatment: Yes Reason for Co-Treatment: For patient/therapist safety;To address functional/ADL transfers PT goals addressed during session: Mobility/safety with mobility;Balance;Proper use of DME OT goals addressed during session: ADL's and self-care;Proper use of Adaptive equipment and DME;Strengthening/ROM      AM-PAC OT "6 Clicks" Daily Activity     Outcome Measure Help from another person eating meals?: None Help from another person taking care of personal grooming?: A Little Help from another person toileting, which includes using toliet, bedpan, or urinal?: A Lot Help from another person bathing (including washing, rinsing, drying)?: A Lot Help from another person to put on and taking off regular upper body clothing?: A Little Help from another person to put on and taking off regular lower body clothing?: A Lot 6 Click Score: 16   End of Session Equipment Utilized During Treatment: Gait belt;Rolling walker (2 wheels) Nurse Communication: Mobility status;Other (comment) (check bandage over incision, needs purewick)  Activity Tolerance: Patient tolerated treatment well Patient left: in chair;with call bell/phone within  reach;with family/visitor present;with chair alarm set  OT Visit Diagnosis: Unsteadiness on feet (R26.81);Other abnormalities of gait and mobility (R26.89);Muscle weakness (generalized) (M62.81);History of falling (Z91.81);Repeated falls (R29.6);Other symptoms and signs involving cognitive function;Pain Pain - Right/Left: Left Pain - part of body: Hip                Time: 1610-9604 OT Time Calculation (min): 45 min Charges:  OT General Charges $OT Visit: 1 Visit OT Evaluation $OT Eval Moderate Complexity: 1 Mod  Nyoka Cowden OTR/L Acute Rehabilitation Services Office: [phone]  Emelda Fear 01/16/2024, 1:39 PM

## 2024-01-16 NOTE — Evaluation (Signed)
 Physical Therapy Evaluation Patient Details Name: [name] MRN: [id number] DOB: September 13, 1941 Today's Date: 01/16/2024  History of Present Illness  [name] is an 83 y/o female admitted 01/14/24 from home with hospice for failure to thrive after a fall and dx of L femoral neck fracture. She is now s/p L anterior approach hemi hip arthroplasty 01/15/24. PMH includes Arthritis, Atherosclerosis of aorta (2019), Esophageal dysmotility, Esophageal ulcer, Gastroparesis, Hiatal hernia, Iron deficiency anemia, Macular degeneration (senile) of retina, Neuropathy, Nutcracker esophagus, Osteopenia, Pulmonary fibrosis, Raynaud's disease, Recurrent pneumonia, Thrombocytosis,  Breast lumpectomy; Total abdominal hysterectomy.  Clinical Impression  Pt admitted with above diagnosis. At baseline, pt was at home with near 24 hr care.  She had assist with ADLs and ambulated with rollator.  Did report hx of falls.  Noted that pt was at home with hospice, per [facility] note family will likely discontinue hospice at this time for further rehab.  Today, pt motivated to work with therapy.  She required min A but mod/max cues for transfers and to ambulate 25' with RW.  Pt reports pain at 10/10 but had no signs of severe pain.  She worked well with therapy and does seem to have good rehab potential.  Pt currently with functional limitations due to the deficits listed below (see PT Problem List). Pt will benefit from acute skilled PT to increase their independence and safety with mobility to allow discharge.  Patient will benefit from continued inpatient follow up therapy, <3 hours/day at d/c.          If plan is discharge home, recommend the following: A lot of help with walking and/or transfers;A lot of help with bathing/dressing/bathroom   Can travel by private vehicle   Yes    Equipment Recommendations Wheelchair cushion (measurements PT);Wheelchair (measurements PT)  Recommendations for Other Services       Functional  Status Assessment Patient has had a recent decline in their functional status and demonstrates the ability to make significant improvements in function in a reasonable and predictable amount of time.     Precautions / Restrictions Precautions Precautions: Fall Restrictions LLE Weight Bearing Per Provider Order: Weight bearing as tolerated      Mobility  Bed Mobility Overal bed mobility: Needs Assistance Bed Mobility: Supine to Sit     Supine to sit: Min assist     General bed mobility comments: light min A to scoot forward; pt self assisted L LE with UE    Transfers Overall transfer level: Needs assistance Equipment used: Rolling walker (2 wheels), 1 person hand held assist Transfers: Sit to/from Stand, Bed to chair/wheelchair/BSC Sit to Stand: Min assist   Step pivot transfers: Mod assist       General transfer comment: Stood from bed x 2 and bsc x 1 with min A and cues for hand placement.  Initial pivot to bsc with RW but pt more anxious, leaning back, needing assist with RW.  For transfer back to bed did 1 person HHA with pt holding therapist arms and turned back to bed - pt more relaxed and less posterior lean    Ambulation/Gait Ambulation/Gait assistance: Min assist, +2 safety/equipment Gait Distance (Feet): 20 Feet Assistive device: Rolling walker (2 wheels) Gait Pattern/deviations: Step-to pattern, Decreased stride length Gait velocity: decreased     General Gait Details: Pt typically using a rollator and tends to keep RW too far forward.  Max cues and min A to keep RW close (improved by end of walk).  Cues for  sequencing.  Pt with high, marching like steps - has neuropathy.  Stairs            Wheelchair Mobility     Tilt Bed    Modified Rankin (Stroke Patients Only)       Balance Overall balance assessment: Needs assistance Sitting-balance support: No upper extremity supported Sitting balance-Leahy Scale: Good     Standing balance support:  Bilateral upper extremity supported Standing balance-Leahy Scale: Poor Standing balance comment: RW and min A                             Pertinent Vitals/Pain Pain Assessment Pain Assessment: 0-10 Pain Score: 10-Worst pain ever Pain Location: L hip Pain Descriptors / Indicators: Discomfort (reports at 10/10 but no signs of severe pain) Pain Intervention(s): Limited activity within patient's tolerance, Monitored during session, Premedicated before session, Repositioned    Home Living Family/patient expects to be discharged to:: Private residence Living Arrangements: Alone Available Help at Discharge: Personal care attendant;Available 24 hours/day Type of Home: Other(Comment) (townhouse) Home Access: Level entry       Home Layout: One level Home Equipment: Shower seat;Grab bars - tub/shower;Rolling Walker (2 wheels);Rollator (4 wheels);Cane - single point;BSC/3in1 Additional Comments: She was at home with hospice    Prior Function Prior Level of Function : Needs assist             Mobility Comments: Walks in home with rollator; reports falls - feels like legs give away ADLs Comments: Pt performs dressing and toileting with supervision; has some assist with showers; has assist with IADLs     Extremity/Trunk Assessment   Upper Extremity Assessment Upper Extremity Assessment: Defer to OT evaluation    Lower Extremity Assessment Lower Extremity Assessment: LLE deficits/detail;RLE deficits/detail RLE Deficits / Details: ROM WFL; Appears to have at least 3/5 strength throughout, did not further test LLE Deficits / Details: ROM WFL (excellent for POD #1); MMT: at least 3/5 but did not further test due to pain    Cervical / Trunk Assessment Cervical / Trunk Assessment: Normal  Communication   Communication Communication: No apparent difficulties    Cognition Arousal: Alert Behavior During Therapy: Anxious   PT - Cognitive impairments: Problem solving,  Sequencing                       PT - Cognition Comments: Some anxiousness with movement requiring increased cues Following commands: Intact       Cueing Cueing Techniques: Verbal cues, Tactile cues, Visual cues     General Comments General comments (skin integrity, edema, etc.): Pt saying L hip incision itching - pull up her gown and easily lifted the surgical dressing.  Erythema surronding dressing and surgical site.  Made nursing aware that dressing is not intact.  Pt with multiple family members present, including son, expressed that she was in process of moving into [facility] and feel that SNF would be best option for her to get level of care she needs.    Exercises Other Exercises Other Exercises: Pt asking about appropriate exercises demonstrated 5 reps : heel slides, ankle pumps, quad sets   Assessment/Plan    PT Assessment Patient needs continued PT services  PT Problem List Decreased strength;Pain;Decreased range of motion;Decreased cognition;Decreased activity tolerance;Decreased knowledge of use of DME;Decreased balance;Decreased safety awareness;Decreased mobility       PT Treatment Interventions DME instruction;Therapeutic exercise;Gait training;Stair training;Functional mobility training;Therapeutic activities;Patient/family education;Balance  training;Modalities;Cognitive remediation;Neuromuscular re-education    PT Goals (Current goals can be found in the Care Plan section)  Acute Rehab PT Goals Patient Stated Goal: Family reports rehab at d/c , pt in agreement PT Goal Formulation: With patient/family Time For Goal Achievement: 01/30/24 Potential to Achieve Goals: Good    Frequency Min 1X/week     Co-evaluation PT/OT/SLP Co-Evaluation/Treatment: Yes Reason for Co-Treatment: For patient/therapist safety           AM-PAC PT "6 Clicks" Mobility  Outcome Measure Help needed turning from your back to your side while in a flat bed without using  bedrails?: A Little Help needed moving from lying on your back to sitting on the side of a flat bed without using bedrails?: A Little Help needed moving to and from a bed to a chair (including a wheelchair)?: A Lot (min A with mod/max cues for all below) Help needed standing up from a chair using your arms (e.g., wheelchair or bedside chair)?: A Lot Help needed to walk in hospital room?: A Lot Help needed climbing 3-5 steps with a railing? : A Lot 6 Click Score: 14    End of Session Equipment Utilized During Treatment: Gait belt Activity Tolerance: Patient tolerated treatment well Patient left: in chair;with chair alarm set;with family/visitor present;with call bell/phone within reach Nurse Communication: Mobility status;Other (comment) (dressing not intact) PT Visit Diagnosis: Other abnormalities of gait and mobility (R26.89);Muscle weakness (generalized) (M62.81);History of falling (Z91.81)    Time: 4098-1191 PT Time Calculation (min) (ACUTE ONLY): 45 min   Charges:   PT Evaluation $PT Eval Low Complexity: 1 Low PT Treatments $Gait Training: 8-22 mins PT General Charges $$ ACUTE PT VISIT: 1 Visit         Anise Salvo, PT Acute Rehab [facility] [phone]   Rayetta Humphrey 01/16/2024, 12:21 PM

## 2024-01-16 NOTE — Plan of Care (Signed)
   Problem: Coping: Goal: Level of anxiety will decrease Outcome: Progressing   Problem: Pain Managment: Goal: General experience of comfort will improve and/or be controlled Outcome: Progressing   Problem: Safety: Goal: Ability to remain free from injury will improve Outcome: Progressing

## 2024-01-16 NOTE — [place] FL2 (Signed)
 [place] MEDICAID FL2 LEVEL OF CARE FORM     IDENTIFICATION  Patient Name: [name] Birthdate: 1941/09/01 Sex: female Admission Date (Current Location): 01/14/2024  [facility] and [place] Number:  Producer, television/film/video and Address:  [facility],  [address] [place], [place] [address]      Provider Number: [id number]  Attending Physician Name and Address:  Meredeth Ide, MD  Relative Name and Phone Number:  Lilyauna, Miedema (Daughter)  [phone] (Mobile)    Current Level of Care: Hospital Recommended Level of Care: Skilled Nursing Facility Prior Approval Number:    Date Approved/Denied:   PASRR Number: [id number]  Discharge Plan: SNF    Current Diagnoses: Patient Active Problem List   Diagnosis Date Noted   Fracture of femoral neck, left (HCC) 01/14/2024   Eating disorder 01/14/2024   Osteopenia of multiple sites 01/05/2023   Porokeratosis 01/05/2023   Pain due to onychomycosis of toenails of both feet 01/05/2023   Bilateral dry eyes 01/07/2022   Pain in both lower extremities 12/23/2021   Purple toe syndrome of both feet (HCC) 12/23/2021   Iron deficiency anemia 09/25/2021   Senile purpura (HCC) 09/25/2021   Syndrome of inappropriate secretion of antidiuretic hormone (HCC) 09/25/2021   Macular degeneration 03/18/2021   Choroidal nevus, left eye 01/01/2021   Early stage nonexudative age-related macular degeneration of both eyes 01/01/2021   Thrombocytosis 12/08/2020   Bronchiolectasis/pulmonary fibrosis 11/27/2020   Hardening of the aorta (main artery of the heart) (HCC) 11/27/2020   Idiopathic progressive polyneuropathy 11/27/2020   Intestinal malabsorption 11/27/2020   Malnutrition of mild degree Lily Kocher: 75% to less than 90% of standard weight) (HCC) 11/27/2020   Urge incontinence of urine 11/27/2020   Protein calorie malnutrition (HCC) 11/24/2020   Left epiretinal membrane 04/01/2020   Posterior vitreous detachment of both eyes 04/01/2020    Burning sensation of feet 02/17/2020   Venous insufficiency 01/02/2019   Carpal tunnel syndrome of right wrist 11/24/2018   Sore throat 12/08/2017   Hoarseness 12/08/2017   Abnormal CT scan 12/06/2017   Laryngopharyngeal reflux (LPR) 10/03/2017   Temporomandibular jaw dysfunction 10/03/2017   Tension-type headache, not intractable 09/12/2017   Abdominal pain 04/28/2017   Other fatigue 04/28/2017   Pneumonia 12/14/2016   Neuropathy 07/30/2016   TIA (transient ischemic attack) 06/22/2016   New onset of headaches after age 83 06/22/2016   Dysphagia    Allergic reaction 01/11/2015   Arthritis of right ankle 10/09/2013   Pes cavus 10/09/2013   Loss of transverse plantar arch 10/09/2013   Allergic rhinitis 05/14/2013   BRONCHIECTASIS 12/15/2010   Nutcracker esophagus 01/26/2008   GERD/LPR and nutcracke esophagus 01/26/2008   GASTROPARESIS 01/26/2008    Orientation RESPIRATION BLADDER Height & Weight     Self, Time, Situation, Place  Normal Incontinent, External catheter Weight: 36 kg Height:  5\' 4"  (162.6 cm)  BEHAVIORAL SYMPTOMS/MOOD NEUROLOGICAL BOWEL NUTRITION STATUS      Continent Diet  AMBULATORY STATUS COMMUNICATION OF NEEDS Skin   Limited Assist Verbally Normal                       Personal Care Assistance Level of Assistance  Bathing, Feeding, Dressing Bathing Assistance: Limited assistance Feeding assistance: Limited assistance Dressing Assistance: Limited assistance     Functional Limitations Info  Sight, Hearing, Speech Sight Info: Adequate Hearing Info: Adequate Speech Info: Adequate    SPECIAL CARE FACTORS FREQUENCY  PT (By licensed PT), OT (By licensed OT)  PT Frequency: 5x/wk OT Frequency: 5x/wk            Contractures Contractures Info: Not present    Additional Factors Info  Code Status, Allergies, Psychotropic Code Status Info: DNR Allergies Info: Prednisone, Sulfa Antibiotics, Cortisone Psychotropic Info: N/A          Current Medications (01/16/2024):  This is the current hospital active medication list Current Facility-Administered Medications  Medication Dose Route Frequency Provider Last Rate Last Admin   0.9 %  sodium chloride infusion   Intravenous Continuous Swinteck, Arlys John, MD   Stopped at 01/16/24 [phone]   acetaminophen (TYLENOL) tablet 325-650 mg  325-650 mg Oral Q6H PRN Samson Frederic, MD       alum & mag hydroxide-simeth (MAALOX/MYLANTA) 200-200-20 MG/5ML suspension 30 mL  30 mL Oral Q4H PRN Swinteck, Arlys John, MD       aspirin chewable tablet 81 mg  81 mg Oral BID Samson Frederic, MD   81 mg at 01/16/24 2706   diphenhydrAMINE (BENADRYL) 12.5 MG/5ML elixir 12.5-25 mg  12.5-25 mg Oral Q4H PRN Swinteck, Arlys John, MD       docusate sodium (COLACE) capsule 100 mg  100 mg Oral BID Samson Frederic, MD   100 mg at 01/16/24 0848   HYDROcodone-acetaminophen (NORCO) 7.5-325 MG per tablet 1-2 tablet  1-2 tablet Oral Q4H PRN Samson Frederic, MD   1 tablet at 01/15/24 2140   HYDROcodone-acetaminophen (NORCO/VICODIN) 5-325 MG per tablet 1-2 tablet  1-2 tablet Oral Q4H PRN Samson Frederic, MD       LORazepam (ATIVAN) tablet 0.5 mg  0.5 mg Oral Q6H PRN Samson Frederic, MD   0.5 mg at 01/16/24 0120   menthol-cetylpyridinium (CEPACOL) lozenge 3 mg  1 lozenge Oral PRN Swinteck, Arlys John, MD       Or   phenol (CHLORASEPTIC) mouth spray 1 spray  1 spray Mouth/Throat PRN Swinteck, Arlys John, MD       methocarbamol (ROBAXIN) tablet 500 mg  500 mg Oral Q6H PRN Samson Frederic, MD   500 mg at 01/15/24 2140   Or   methocarbamol (ROBAXIN) injection 500 mg  500 mg Intravenous Q6H PRN Swinteck, Arlys John, MD       metoCLOPramide (REGLAN) tablet 5 mg  5 mg Oral Q8H PRN Swinteck, Arlys John, MD       Or   metoCLOPramide (REGLAN) injection 5 mg  5 mg Intravenous Q8H PRN Swinteck, Arlys John, MD       metoprolol tartrate (LOPRESSOR) injection 2.5 mg  2.5 mg Intravenous Q8H PRN Swinteck, Arlys John, MD       morphine (PF) 2 MG/ML injection 0.5-1 mg  0.5-1 mg  Intravenous Q2H PRN Swinteck, Arlys John, MD   0.5 mg at 01/15/24 1442   ondansetron (ZOFRAN) tablet 4 mg  4 mg Oral Q6H PRN Swinteck, Arlys John, MD       Or   ondansetron (ZOFRAN) injection 4 mg  4 mg Intravenous Q6H PRN Swinteck, Arlys John, MD       pantoprazole (PROTONIX) EC tablet 40 mg  40 mg Oral Daily Swinteck, Arlys John, MD   40 mg at 01/16/24 0848   polyethylene glycol (MIRALAX / GLYCOLAX) packet 17 g  17 g Oral Daily PRN Swinteck, Arlys John, MD       senna (SENOKOT) tablet 8.6 mg  1 tablet Oral BID Samson Frederic, MD   8.6 mg at 01/16/24 0848   sucralfate (CARAFATE) tablet 1 g  1 g Oral See admin instructions Samson Frederic, MD         Discharge  Medications: Please see discharge summary for a list of discharge medications.  Relevant Imaging Results:  Relevant Lab Results:   Additional Information SSN: [id number]  [place] Rucks, RN

## 2024-01-16 NOTE — TOC Initial Note (Addendum)
 Transition of Care [facility]) - Initial/Assessment Note    Patient Details  Name: [name] MRN: [id number] Date of Birth: [birth year]  Transition of [facility]) CM/SW Contact:    [place] Rucks, RN Phone Number: 01/16/2024, 9:41 AM  Clinical Narrative:   Met with pt and family at  bedside to introduce role of TOC/NCM and review for dc planning, pt/family confirmed pt was to admit to [facility] on day of hospital admission, pt currently receiving home hospice with [facility]. PT eval pending, family states if recommendation is for short term rehab they would like to revoke the home hospice for short term rehab/SNF and resume one patient returns to home at ILF. PT notified of pt/family dc plans. Glenna Fellows, rep-[facility] notified. PT eval pending, await recommendation.       -1:09pm PT eval completed, recommendation for short term rehab/SNF,  FL2 updated, faxed out for bed offers.   Teams chat from [place] [facility] "  if she revokes her hospice benefit she will revert back to her traditional Medicare for the remainder of the month"          Barriers to Discharge: Continued Medical Work up   Patient Goals and CMS Choice Patient states their goals for this hospitalization and ongoing recovery are:: return home to [facility]          Expected Discharge Plan and Services       Living arrangements for the past 2 months: Single Family Home                                      Prior Living Arrangements/Services Living arrangements for the past 2 months: Single Family Home Lives with:: Self   Do you feel safe going back to the place where you live?: Yes               Activities of Daily Living   ADL Screening (condition at time of admission) Independently performs ADLs?: No Does the patient have a NEW difficulty with bathing/dressing/toileting/self-feeding that is expected to last >3 days?: No Does the patient have a NEW difficulty with getting in/out of  bed, walking, or climbing stairs that is expected to last >3 days?: No Does the patient have a NEW difficulty with communication that is expected to last >3 days?: No Is the patient deaf or have difficulty hearing?: No Does the patient have difficulty seeing, even when wearing glasses/contacts?: No Does the patient have difficulty concentrating, remembering, or making decisions?: No  Permission Sought/Granted                  Emotional Assessment Appearance:: Appears stated age Attitude/Demeanor/Rapport: Gracious Affect (typically observed): Accepting Orientation: : Oriented to Self, Oriented to Place, Oriented to  Time, Oriented to Situation Alcohol / Substance Use: Not Applicable Psych Involvement: No (comment)  Admission diagnosis:  Fracture of femoral neck, left (HCC) [S72.002A] Other closed fracture of left femur, unspecified portion of femur, initial encounter (HCC) [S72.8X2A] Patient Active Problem List   Diagnosis Date Noted   Fracture of femoral neck, left (HCC) 01/14/2024   Eating disorder 01/14/2024   Osteopenia of multiple sites 01/05/2023   Porokeratosis 01/05/2023   Pain due to onychomycosis of toenails of both feet 01/05/2023   Bilateral dry eyes 01/07/2022   Pain in both lower extremities 12/23/2021   Purple toe syndrome of both feet (HCC) 12/23/2021   Iron deficiency  anemia 09/25/2021   Senile purpura (HCC) 09/25/2021   Syndrome of inappropriate secretion of antidiuretic hormone (HCC) 09/25/2021   Macular degeneration 03/18/2021   Choroidal nevus, left eye 01/01/2021   Early stage nonexudative age-related macular degeneration of both eyes 01/01/2021   Thrombocytosis 12/08/2020   Bronchiolectasis/pulmonary fibrosis 11/27/2020   Hardening of the aorta (main artery of the heart) (HCC) 11/27/2020   Idiopathic progressive polyneuropathy 11/27/2020   Intestinal malabsorption 11/27/2020   Malnutrition of mild degree Lily Kocher: 75% to less than 90% of standard  weight) (HCC) 11/27/2020   Urge incontinence of urine 11/27/2020   Protein calorie malnutrition (HCC) 11/24/2020   Left epiretinal membrane 04/01/2020   Posterior vitreous detachment of both eyes 04/01/2020   Burning sensation of feet 02/17/2020   Venous insufficiency 01/02/2019   Carpal tunnel syndrome of right wrist 11/24/2018   Sore throat 12/08/2017   Hoarseness 12/08/2017   Abnormal CT scan 12/06/2017   Laryngopharyngeal reflux (LPR) 10/03/2017   Temporomandibular jaw dysfunction 10/03/2017   Tension-type headache, not intractable 09/12/2017   Abdominal pain 04/28/2017   Other fatigue 04/28/2017   Pneumonia 12/14/2016   Neuropathy 07/30/2016   TIA (transient ischemic attack) 06/22/2016   New onset of headaches after age 33 06/22/2016   Dysphagia    Allergic reaction 01/11/2015   Arthritis of right ankle 10/09/2013   Pes cavus 10/09/2013   Loss of transverse plantar arch 10/09/2013   Allergic rhinitis 05/14/2013   BRONCHIECTASIS 12/15/2010   Nutcracker esophagus 01/26/2008   GERD/LPR and nutcracke esophagus 01/26/2008   GASTROPARESIS 01/26/2008   PCP:  System, Provider Not In Pharmacy:   [facility] [place], [place] - [address] [facility] [address] [address] [place] [address] Phone: [phone] Fax: [phone]     Social Drivers of Health (SDOH) Social History: SDOH Screenings   Food Insecurity: No Food Insecurity (01/14/2024)  Housing: Low Risk  (01/14/2024)  Transportation Needs: No Transportation Needs (01/14/2024)  Utilities: Not At Risk (01/14/2024)  Tobacco Use: Medium Risk (01/14/2024)   SDOH Interventions:     Readmission Risk Interventions    01/16/2024    9:40 AM  Readmission Risk Prevention Plan  Post Dischage Appt Complete  Medication Screening Complete  Transportation Screening Complete

## 2024-01-16 NOTE — Plan of Care (Signed)

## 2024-01-16 NOTE — Progress Notes (Signed)
 [facility]  PROGRESS NOTE  [name] WUJ:[id number] DOB: 01/12/1941 DOA: 01/14/2024 PCP: System, Provider Not In   Brief HPI:   83 y.o. female with medical history significant of BOOP, gastroparesis, GERD, migraines, IDA, pulmonary fibrosis, SIADH, hx of TIA, eating disorder who presented to ED after a mechanical fall. She is on home hospice for FTT. She tripped on her walker and fell right onto her left hip. Caregiver was there, but fall was unwitnessed.  This is her 3rd significant fall since August. She was supposed to be moving to [facility] today in [place], but bring in nursing care. She states she was walking and her leg just gave out. She fell down and does states she hit her head pretty hard on the door. She had immediate pain in the hip.  Left femur xray: nondisplaced left femoral neck fracture.    Assessment/Plan:   Fracture of femoral neck, left (HCC) 83 year old female with history of mechanical fall and subsequent left femoral neck fracture who is on hospice for FTT and bronchiolectasis.  -Status post left hip hemiarthroplasty Orthopedics following DVT prophylaxis-aspirin per orthopedics   Syndrome of inappropriate secretion of antidiuretic hormone (HCC) Stable around baseline of 128-130 Continue to monitor   Hypokalemia -Replete   GERD/LPR and nutcracke esophagus Continue PPI and carafate    Bronchiolectasis/pulmonary fibrosis former smoker with bronchiectasis, recurrent aspiration pneumonia with history of reflux, emphysema and history of cryptogenic organizing pneumonia.  Followed by pulmonology  flutter valve, symbicort, spiriva, albuterol, levalbuterol and hypertonic saline caused palpitations and dizziness - she is too frail to undergo bronchoscopy - now on hospice    Iron deficiency anemia Stable    Eating disorder Life long eating disorder per family.  Does not eat much of anything  On hospice Check vitamin D, prealbumin Nutrition consult with  surgery  Wound healing/recovery will be very difficult. Family aware      PT evaluation is pending   Medications     aspirin  81 mg Oral BID   docusate sodium  100 mg Oral BID   pantoprazole  40 mg Oral Daily   senna  1 tablet Oral BID   sucralfate  1 g Oral See admin instructions     Data Reviewed:   CBG:  No results for input(s): "GLUCAP" in the last 168 hours.  SpO2: 100 % O2 Flow Rate (L/min): 2 L/min    Vitals:   01/15/24 1418 01/15/24 1731 01/15/24 2102 01/16/24 0722  BP: (!) 118/96 (!) 132/94 (!) 134/90 (!) 143/94  Pulse: 92 95 84 73  Resp: 16 17 18 16   Temp: 98.5 F (36.9 C) 97.7 F (36.5 C) 98.4 F (36.9 C) 98 F (36.7 C)  TempSrc:   Oral Oral  SpO2: 97% 100% 99% 100%  Weight:      Height:          Data Reviewed:  Basic Metabolic Panel: Recent Labs  Lab 01/14/24 1420 01/15/24 0351 01/16/24 0844  NA 127* 128* 126*  K 4.0 3.2* 4.0  CL 91* 92* 92*  CO2 27 25 23   GLUCOSE 98 215* 143*  BUN 8 6* 10  CREATININE 0.35* 0.45 0.47  CALCIUM 9.1 8.7* 9.0    CBC: Recent Labs  Lab 01/14/24 1420 01/15/24 0351 01/16/24 0844  WBC 9.3 10.9* 10.8*  HGB 12.2 12.7 11.0*  HCT 37.1 40.6 34.9*  MCV 89.4 93.1 93.3  PLT 378 349 374    LFT No results for input(s): "AST", "ALT", "  ALKPHOS", "BILITOT", "PROT", "ALBUMIN" in the last 168 hours.   Antibiotics: Anti-infectives (From admission, onward)    Start     Dose/Rate Route Frequency Ordered Stop   01/15/24 0800  amoxicillin-clavulanate (AUGMENTIN) 875-125 MG per tablet 1 tablet  Status:  Discontinued       Note to Pharmacy: 21 day course   1 tablet Oral 2 times daily with meals 01/14/24 1830 01/14/24 1912   01/15/24 0745  ceFAZolin (ANCEF) IVPB 2g/100 mL premix        2 g 200 mL/hr over 30 Minutes Intravenous On call to O.R. 01/15/24 0645 01/15/24 0814   01/15/24 0647  ceFAZolin (ANCEF) 2-4 GM/100ML-% IVPB       Note to Pharmacy: Myrlene Broker M: cabinet override      01/15/24 0647 01/15/24 0814         DVT prophylaxis: As per orthopedics  Code Status: DNR  Family Communication:    CONSULTS orthopedics   Subjective   Patient seen, denies pain.   Objective    Physical Examination:   General-appears in no acute distress Heart-S1-S2, regular, no murmur auscultated Lungs-clear to auscultation bilaterally, no wheezing or crackles auscultated Abdomen-soft, nontender, no organomegaly Extremities-no edema in the lower extremities Neuro-alert, oriented x3, no focal deficit noted   Status is: Inpatient:             Meredeth Ide   [facility] If 7PM-7AM, please contact night-coverage at [email or web address], Office  [phone]   01/16/2024, 11:38 AM  LOS: 2 days

## 2024-01-17 DIAGNOSIS — J479 Bronchiectasis, uncomplicated: Secondary | ICD-10-CM | POA: Diagnosis not present

## 2024-01-17 DIAGNOSIS — K219 Gastro-esophageal reflux disease without esophagitis: Secondary | ICD-10-CM | POA: Diagnosis not present

## 2024-01-17 DIAGNOSIS — E222 Syndrome of inappropriate secretion of antidiuretic hormone: Secondary | ICD-10-CM | POA: Diagnosis not present

## 2024-01-17 DIAGNOSIS — S72002A Fracture of unspecified part of neck of left femur, initial encounter for closed fracture: Secondary | ICD-10-CM | POA: Diagnosis not present

## 2024-01-17 LAB — CBC
HCT: 33.4 % — ABNORMAL LOW (ref 36.0–46.0)
Hemoglobin: 10.8 g/dL — ABNORMAL LOW (ref 12.0–15.0)
MCH: 29.5 pg (ref 26.0–34.0)
MCHC: 32.3 g/dL (ref 30.0–36.0)
MCV: 91.3 fL (ref 80.0–100.0)
Platelets: 335 10*3/uL (ref 150–400)
RBC: 3.66 MIL/uL — ABNORMAL LOW (ref 3.87–5.11)
RDW: 12.5 % (ref 11.5–15.5)
WBC: 8.5 10*3/uL (ref 4.0–10.5)
nRBC: 0 % (ref 0.0–0.2)

## 2024-01-17 MED ORDER — SODIUM CHLORIDE 1 G PO TABS
1.0000 g | ORAL_TABLET | Freq: Two times a day (BID) | ORAL | Status: AC
  Administered 2024-01-17 – 2024-01-18 (×2): 1 g via ORAL
  Filled 2024-01-17 (×2): qty 1

## 2024-01-17 NOTE — Progress Notes (Addendum)
 Per dayshift RN, patient took off surgical silver hydrofiber dressing. When I went into room the second time she had also taken the surgical dressing off. I educated the patient on the importance of leaving the dressing on to prevent infection. Patient stated that she understood and would not take dressing off again. Will continue to monitor.

## 2024-01-17 NOTE — TOC Progression Note (Addendum)
 Transition of Care [facility]) - Progression Note    Patient Details  Name: [name] MRN: [id number] Date of Birth: 1941-10-09  Transition of Care [facility]) CM/SW Contact  [place] Rucks, RN Phone Number: 01/17/2024, 11:55 AM  Clinical Narrative:   Met with pt and pt's son Aurther Loft) at bedside to review short term rehab bed offers [facility], [address], [place], [place], [place]). Family to review and notify NCM of facility choice   -2:07pm Met with pt's son Aurther Loft) on nursing unit, accepted bed offer at [facility].   -2:58pm SNF auth initiated via Reola Calkins ID [id number], days approved 01/18/2024-01/20/2024, team notified.        Barriers to Discharge: Continued Medical Work up  Expected Discharge Plan and Services       Living arrangements for the past 2 months: Single Family Home                                       Social Determinants of Health (SDOH) Interventions SDOH Screenings   Food Insecurity: No Food Insecurity (01/14/2024)  Housing: Low Risk  (01/14/2024)  Transportation Needs: No Transportation Needs (01/14/2024)  Utilities: Not At Risk (01/14/2024)  Tobacco Use: Medium Risk (01/14/2024)    Readmission Risk Interventions    01/16/2024    9:40 AM  Readmission Risk Prevention Plan  Post Dischage Appt Complete  Medication Screening Complete  Transportation Screening Complete

## 2024-01-17 NOTE — Plan of Care (Signed)
  Problem: Education: Goal: Knowledge of General Education information will improve Description: Including pain rating scale, medication(s)/side effects and non-pharmacologic comfort measures Outcome: Progressing   Problem: Health Behavior/Discharge Planning: Goal: Ability to manage health-related needs will improve Outcome: Progressing   Problem: Clinical Measurements: Goal: Ability to maintain clinical measurements within normal limits will improve Outcome: Progressing Goal: Diagnostic test results will improve Outcome: Progressing Goal: Respiratory complications will improve Outcome: Progressing Goal: Cardiovascular complication will be avoided Outcome: Progressing   Problem: Activity: Goal: Risk for activity intolerance will decrease Outcome: Progressing   Problem: Nutrition: Goal: Adequate nutrition will be maintained Outcome: Progressing   Problem: Coping: Goal: Level of anxiety will decrease Outcome: Progressing   Problem: Elimination: Goal: Will not experience complications related to bowel motility Outcome: Progressing Goal: Will not experience complications related to urinary retention Outcome: Progressing   Problem: Pain Managment: Goal: General experience of comfort will improve and/or be controlled Outcome: Progressing   Problem: Safety: Goal: Ability to remain free from injury will improve Outcome: Progressing   Problem: Pain Management: Goal: Pain level will decrease with appropriate interventions Outcome: Progressing   Problem: Skin Integrity: Goal: Will show signs of wound healing Outcome: Progressing

## 2024-01-17 NOTE — Progress Notes (Signed)
 [facility]  PROGRESS NOTE  [name] ZOX:[id number] DOB: 06-Nov-1941 DOA: 01/14/2024 PCP: System, Provider Not In   Brief HPI:   83 y.o. female with medical history significant of BOOP, gastroparesis, GERD, migraines, IDA, pulmonary fibrosis, SIADH, hx of TIA, eating disorder who presented to ED after a mechanical fall. She is on home hospice for FTT. She tripped on her walker and fell right onto her left hip. Caregiver was there, but fall was unwitnessed.  This is her 3rd significant fall since August. She was supposed to be moving to [place] today in [place], but bring in nursing care. She states she was walking and her leg just gave out. She fell down and does states she hit her head pretty hard on the door. She had immediate pain in the hip.  Left femur xray: nondisplaced left femoral neck fracture.    Assessment/Plan:   Fracture of femoral neck, left (HCC) 83 year old female with history of mechanical fall and subsequent left femoral neck fracture who is on hospice for FTT and bronchiolectasis.  -Status post left hip hemiarthroplasty Orthopedics following DVT prophylaxis-aspirin per orthopedics   Syndrome of inappropriate secretion of antidiuretic hormone (HCC) Stable around baseline of 128-130 Sodium is down to 126 today We will give sodium chloride tablets 2 g twice daily for 1 day -Fluid restriction  1500 cc/day  Hypokalemia -Replete   GERD/LPR and nutcracke esophagus Continue PPI and carafate    Bronchiolectasis/pulmonary fibrosis former smoker with bronchiectasis, recurrent aspiration pneumonia with history of reflux, emphysema and history of cryptogenic organizing pneumonia.  Followed by pulmonology  flutter valve, symbicort, spiriva, albuterol, levalbuterol and hypertonic saline caused palpitations and dizziness - she is too frail to undergo bronchoscopy -  Iron deficiency anemia Stable    Eating disorder Life long eating disorder per family.  Does not eat  much of anything  On hospice Check vitamin D, prealbumin Nutrition consult with surgery  Wound healing/recovery will be very difficult. Family aware      Goals of care Patient was on hospice before coming to hospital, hospice benefit has been revoked.  Plan to go to skilled nursing facility for rehab and once patient is stable to go back to independent living and likely reenroll in hospice.   Medications     aspirin  81 mg Oral BID   docusate sodium  100 mg Oral BID   multivitamin with minerals  1 tablet Oral Daily   pantoprazole  40 mg Oral Daily   senna  1 tablet Oral BID   sucralfate  1 g Oral See admin instructions     Data Reviewed:   CBG:  No results for input(s): "GLUCAP" in the last 168 hours.  SpO2: 100 % O2 Flow Rate (L/min): 2 L/min    Vitals:   01/16/24 1355 01/16/24 1406 01/16/24 2255 01/17/24 0511  BP: (!) 183/101 (!) 167/117 (!) 147/89 (!) 144/95  Pulse: 76 79 83 77  Resp: 17  17 17   Temp: 97.6 F (36.4 C)  98.4 F (36.9 C) (!) 97.3 F (36.3 C)  TempSrc:   Oral   SpO2: 97%  98% 100%  Weight:      Height:          Data Reviewed:  Basic Metabolic Panel: Recent Labs  Lab 01/14/24 1420 01/15/24 0351 01/16/24 0844  NA 127* 128* 126*  K 4.0 3.2* 4.0  CL 91* 92* 92*  CO2 27 25 23   GLUCOSE 98 215* 143*  BUN 8  6* 10  CREATININE 0.35* 0.45 0.47  CALCIUM 9.1 8.7* 9.0    CBC: Recent Labs  Lab 01/14/24 1420 01/15/24 0351 01/16/24 0844 01/17/24 0343  WBC 9.3 10.9* 10.8* 8.5  HGB 12.2 12.7 11.0* 10.8*  HCT 37.1 40.6 34.9* 33.4*  MCV 89.4 93.1 93.3 91.3  PLT 378 349 374 335    LFT No results for input(s): "AST", "ALT", "ALKPHOS", "BILITOT", "PROT", "ALBUMIN" in the last 168 hours.   Antibiotics: Anti-infectives (From admission, onward)    Start     Dose/Rate Route Frequency Ordered Stop   01/15/24 0800  amoxicillin-clavulanate (AUGMENTIN) 875-125 MG per tablet 1 tablet  Status:  Discontinued       Note to Pharmacy: 21 day course    1 tablet Oral 2 times daily with meals 01/14/24 1830 01/14/24 1912   01/15/24 0745  ceFAZolin (ANCEF) IVPB 2g/100 mL premix        2 g 200 mL/hr over 30 Minutes Intravenous On call to O.R. 01/15/24 0645 01/15/24 0814   01/15/24 0647  ceFAZolin (ANCEF) 2-4 GM/100ML-% IVPB       Note to Pharmacy: Norva Pavlov: cabinet override      01/15/24 0647 01/15/24 0814        DVT prophylaxis: As per orthopedics  Code Status: DNR  Family Communication:    CONSULTS orthopedics   Subjective   Denies any complaints   Objective    Physical Examination:  General-appears in no acute distress Heart-S1-S2, regular, no murmur auscultated Lungs-clear to auscultation bilaterally, no wheezing or crackles auscultated Abdomen-soft, nontender, no organomegaly Extremities-no edema in the lower extremities Neuro-alert, oriented x3, no focal deficit noted    Status is: Inpatient:             Meredeth Ide   [facility] If 7PM-7AM, please contact night-coverage at [email or web address], Office  [phone]   01/17/2024, 7:53 AM  LOS: 3 days

## 2024-01-18 DIAGNOSIS — S72002A Fracture of unspecified part of neck of left femur, initial encounter for closed fracture: Secondary | ICD-10-CM | POA: Diagnosis not present

## 2024-01-18 LAB — BASIC METABOLIC PANEL
Anion gap: 9 (ref 5–15)
BUN: 8 mg/dL (ref 8–23)
CO2: 27 mmol/L (ref 22–32)
Calcium: 8.7 mg/dL — ABNORMAL LOW (ref 8.9–10.3)
Chloride: 91 mmol/L — ABNORMAL LOW (ref 98–111)
Creatinine, Ser: 0.42 mg/dL — ABNORMAL LOW (ref 0.44–1.00)
GFR, Estimated: >60 mL/min (ref >60)
Glucose, Bld: 112 mg/dL — ABNORMAL HIGH (ref 70–99)
Potassium: 3.8 mmol/L (ref 3.5–5.1)
Sodium: 127 mmol/L — ABNORMAL LOW (ref 135–145)

## 2024-01-18 MED ORDER — SODIUM CHLORIDE 1 G PO TABS
2.0000 g | ORAL_TABLET | Freq: Two times a day (BID) | ORAL | Status: DC
  Filled 2024-01-18: qty 2

## 2024-01-18 MED ORDER — ADULT MULTIVITAMIN W/MINERALS CH: 1.0000 | ORAL_TABLET | Freq: Every day | ORAL | Status: DC

## 2024-01-18 MED ORDER — SODIUM CHLORIDE 1 G PO TABS: 2.0000 g | ORAL_TABLET | Freq: Two times a day (BID) | ORAL | Status: AC

## 2024-01-18 MED ORDER — AMLODIPINE BESYLATE 5 MG PO TABS
5.0000 mg | ORAL_TABLET | Freq: Every day | ORAL | Status: DC
  Administered 2024-01-18: 5 mg via ORAL
  Filled 2024-01-18: qty 1

## 2024-01-18 MED ORDER — AMLODIPINE BESYLATE 5 MG PO TABS: 5.0000 mg | ORAL_TABLET | Freq: Every day | ORAL | Status: DC

## 2024-01-18 NOTE — Discharge Summary (Signed)
 Physician Discharge Summary  [name] VWU:[id number] DOB: Jun 06, 1941 DOA: 01/14/2024  PCP: System, Provider Not In  Admit date: 01/14/2024 Discharge date: 01/18/2024  Admitted From: Home Disposition:  Home  Discharge Condition:Stable CODE STATUS:DNR Diet recommendation: Regular with fluid restriction of 1500 mL a day  Brief/Interim Summary: Patient is a 83 y.o. female with medical history significant of BOOP, gastroparesis, GERD, migraines, IDA, pulmonary fibrosis, SIADH, hx of TIA, eating disorder who presented to ED after a mechanical fall. She is on home hospice for FTT. She tripped on her walker and fell right onto her left hip. Caregiver was there, but fall was unwitnessed.  This is her 3rd significant fall since August. She stated that she was walking and her leg just gave out. She fell down and does states she hit her head pretty hard on the door. She had immediate pain in the hip. Left femur xray: nondisplaced left femoral neck fracture.  Orthopedics consulted, status post ORIF.  PT/OT recommending SNF on discharge.  Following problems were addressed during the hospitalization:  Fracture of femoral neck, left  83 year old female with history of mechanical fall and subsequent left femoral neck fracture who is on hospice for FTT and bronchiolectasis.  -Status post left hip hemiarthroplasty Orthopedics were following DVT prophylaxis-aspirin per orthopedics.  Follow-up with orthopedics as an outpatient.   Syndrome of inappropriate secretion of antidiuretic hormone/hyponatremia Stable around baseline of 128-130 Patient also drinks a lot of tea/coffee.  This could have also been contributing to hyponatremia Continue sodium chloride tablets for a week, check BMP in a week -We recommend fluid restriction  1500 cc/day.   Hypokalemia -Repleted   GERD/LPR and nutcracke esophagus Continue PPI and carafate    Bronchiolectasis/pulmonary fibrosis former smoker with bronchiectasis,  recurrent aspiration pneumonia with history of reflux, emphysema and history of cryptogenic organizing pneumonia.  Followed by pulmonology  - she is too frail to undergo bronchoscopy -Currently respiratory status stable  Iron deficiency anemia Hemoglobin stable   Hypertension Blood pressure has consistently been noted in the higher range.  Started on low-dose amlodipine   Eating disorder Life long eating disorder per family.  Does not eat much of anything  On hospice Nutritionist was consulted Wound healing/recovery will be very difficult. Family aware    Goals of care Patient was on hospice before coming to hospital, hospice benefit has been revoked.  Plan to go to skilled nursing facility for rehab and once patient is stable to go back to independent living and likely reenroll in hospice.  Discharge Diagnoses:  Principal Problem:   Fracture of femoral neck, left (HCC) Active Problems:   Syndrome of inappropriate secretion of antidiuretic hormone (HCC)   GERD/LPR and nutcracke esophagus   Bronchiolectasis/pulmonary fibrosis   Iron deficiency anemia   Eating disorder    Discharge Instructions  Discharge Instructions     Diet - low sodium heart healthy   Complete by: As directed    Discharge instructions   Complete by: As directed    1)Please take your medications as instructed 2)Follow up with orthopedics in 2 weeks for x-rays and wound check.  Name and number the provider has been attached 3)Do a BMP test in a week to check your sodium level   Increase activity slowly   Complete by: As directed       Allergies as of 01/18/2024       Reactions   Prednisone Other (See Comments)   Reaction not cited   Sulfa Antibiotics Other (  See Comments)   Reaction not cited   Cortisone Other (See Comments)   Reaction not cited        Medication List     STOP taking these medications    amoxicillin-clavulanate 875-125 MG tablet Commonly known as: AUGMENTIN    oxyCODONE 5 MG immediate release tablet Commonly known as: Roxicodone       TAKE these medications    acetaminophen 500 MG tablet Commonly known as: TYLENOL Take 500 mg by mouth See admin instructions. Take 500 mg by mouth in the morning, at noontime, and bedtime- and an additional  500 mg in the afternoon as needed for unresolved mild pain/discomfort   amLODipine 5 MG tablet Commonly known as: NORVASC Take 1 tablet (5 mg total) by mouth daily.   aspirin 81 MG chewable tablet Commonly known as: Aspirin Childrens Chew 1 tablet (81 mg total) by mouth 2 (two) times daily with a meal.   benzonatate 200 MG capsule Commonly known as: TESSALON Take 1 capsule (200 mg total) by mouth 3 (three) times daily as needed for cough.   bisacodyl 5 MG EC tablet Generic drug: bisacodyl Take 5 mg by mouth at bedtime.   famotidine 20 MG tablet Commonly known as: PEPCID Take 20 mg by mouth 2 (two) times daily as needed for indigestion or heartburn.   Geri-Tussin 100 MG/5ML liquid Generic drug: guaiFENesin Take 15 mLs by mouth every 4 (four) hours as needed for to loosen phlegm or cough.   HYDROcodone-acetaminophen 5-325 MG tablet Commonly known as: NORCO/VICODIN Take 1 tablet by mouth every 4 (four) hours as needed for up to 7 days for moderate pain (pain score 4-6) or severe pain (pain score 7-10).   lidocaine 5 % Commonly known as: Lidoderm Place 1 patch onto the skin daily. Remove & Discard patch within 12 hours or as directed by MD What changed:  when to take this reasons to take this additional instructions   Metamucil Smooth Texture 58.6 % powder Generic drug: psyllium Take 1 packet by mouth 3 (three) times daily. What changed:  when to take this reasons to take this   multivitamin with minerals Tabs tablet Take 1 tablet by mouth daily.   omeprazole 20 MG capsule Commonly known as: PRILOSEC Take 20 mg by mouth See admin instructions. Take 20 mg by mouth 30 minutes before  breakfast and supper/evening meal What changed: Another medication with the same name was removed. Continue taking this medication, and follow the directions you see here.   ondansetron 4 MG tablet Commonly known as: ZOFRAN Take 4 mg by mouth every 8 (eight) hours as needed for nausea or vomiting.   sodium chloride 1 g tablet Take 2 tablets (2 g total) by mouth 2 (two) times daily with a meal for 5 days.   sucralfate 1 g tablet Commonly known as: CARAFATE Take 1 tablet (1 g total) by mouth every 6 (six) hours as needed. Slowly dissolve 1 tablet in 1 Tablespoon of water before ingesting What changed: See the new instructions.        Contact information for follow-up providers     Waldron, Alain Honey, PA-C. Schedule an appointment as soon as possible for a visit in 2 week(s).   Specialty: Orthopedic Surgery Why: For suture removal, For wound re-check Contact information: [address], [address] [place] [address] [phone]              Contact information for after-discharge care     Destination  [facility] Preferred SNF .   Service: Skilled Nursing Contact information: [address] [place] [address] [phone]                    Allergies  Allergen Reactions   Prednisone Other (See Comments)    Reaction not cited   Sulfa Antibiotics Other (See Comments)    Reaction not cited   Cortisone Other (See Comments)    Reaction not cited    Consultations: Orthopedics   Procedures/Studies: DG Pelvis Portable Result Date: 01/15/2024 CLINICAL DATA:  Postop. EXAM: PORTABLE PELVIS 1-2 VIEWS COMPARISON:  Preoperative imaging FINDINGS: Left hip arthroplasty in expected alignment. No periprosthetic lucency or fracture. Recent postsurgical change includes air and edema in the soft tissues. Overlying skin staples in place. IMPRESSION: Left hip arthroplasty without immediate postoperative complication.  Electronically Signed   By: Narda Rutherford M.D.   On: 01/15/2024 10:39   DG HIP UNILAT WITH PELVIS 1V LEFT Result Date: 01/15/2024 CLINICAL DATA:  Elective surgery. EXAM: DG HIP (WITH OR WITHOUT PELVIS) 1V*L* COMPARISON:  Preoperative imaging FINDINGS: Four fluoroscopic spot views of the pelvis and left hip obtained in the operating room. Sequential images during hip arthroplasty. Fluoroscopy time 4 seconds. Dose 0.26 mGy. IMPRESSION: Intraoperative fluoroscopy during left hip arthroplasty. Electronically Signed   By: Narda Rutherford M.D.   On: 01/15/2024 10:39   DG C-Arm 1-60 Min-No Report Result Date: 01/15/2024 Fluoroscopy was utilized by the requesting physician.  No radiographic interpretation.   CT HIP LEFT WO CONTRAST Result Date: 01/14/2024 CLINICAL DATA:  Left hip pain following fall with known femoral neck fracture, initial encounter EXAM: CT OF THE LEFT HIP WITHOUT CONTRAST TECHNIQUE: Multidetector CT imaging of the left hip was performed according to the standard protocol. Multiplanar CT image reconstructions were also generated. RADIATION DOSE REDUCTION: This exam was performed according to the departmental dose-optimization program which includes automated exposure control, adjustment of the mA and/or kV according to patient size and/or use of iterative reconstruction technique. COMPARISON:  Plain films from earlier in the same day. FINDINGS: Bones/Joint/Cartilage Subcapital left femoral neck fracture is noted with mild impaction at the fracture site. No dislocation is noted. No other fracture is seen. Mild left hip joint effusion is noted. Ligaments Suboptimally assessed by CT. Muscles and Tendons Surrounding musculature appears within normal limits. Soft tissues Subcapital left femoral neck fracture with mild impaction at the fracture site. Associated joint effusion is noted. Electronically Signed   By: Alcide Clever M.D.   On: 01/14/2024 19:50   CT HEAD WO CONTRAST ( ) Result Date:  01/14/2024 CLINICAL DATA:  Head trauma, moderate to severe. Mechanical fall. Bleeding from the upper lip. EXAM: CT HEAD WITHOUT CONTRAST TECHNIQUE: Contiguous axial images were obtained from the base of the skull through the vertex without intravenous contrast. RADIATION DOSE REDUCTION: This exam was performed according to the departmental dose-optimization program which includes automated exposure control, adjustment of the mA and/or kV according to patient size and/or use of iterative reconstruction technique. COMPARISON:  CT head without contrast 11/26/2023 FINDINGS: Brain: No acute infarct, hemorrhage, or mass lesion is present. No significant white matter lesions are present. Deep brain nuclei are within normal limits. The ventricles are of normal size. No significant extraaxial fluid collection is present. The brainstem and cerebellum are within normal limits. Midline structures are within normal limits. Vascular: No hyperdense vessel or unexpected calcification. Skull: Calvarium is intact. No focal lytic or blastic lesions are present. No  significant extracranial soft tissue lesion is present. Sinuses/Orbits: The paranasal sinuses and mastoid air cells are clear. The globes and orbits are within normal limits. IMPRESSION: Normal CT appearance of the brain. Electronically Signed   By: Marin Roberts M.D.   On: 01/14/2024 19:41   DG Femur Min 2 Views Left Result Date: 01/14/2024 CLINICAL DATA:  Pain after fall. EXAM: LEFT FEMUR 2 VIEWS; PELVIS - 1-2 VIEW COMPARISON:  None Available. FINDINGS: Femur: Nondisplaced femoral neck fracture. Distal femur is intact. The bones are subjectively under mineralized. Knee alignment is maintained. Pelvis: No additional fracture of the pelvis. Pubic rami are intact. Pubic symphysis and sacroiliac joints are congruent. The bones are subjectively under mineralized. Mild bilateral hip osteoarthritis. IMPRESSION: 1. Nondisplaced left femoral neck fracture. 2. No additional  fracture of the pelvis. Electronically Signed   By: Narda Rutherford M.D.   On: 01/14/2024 16:02   DG Pelvis 1-2 Views Result Date: 01/14/2024 CLINICAL DATA:  Pain after fall. EXAM: LEFT FEMUR 2 VIEWS; PELVIS - 1-2 VIEW COMPARISON:  None Available. FINDINGS: Femur: Nondisplaced femoral neck fracture. Distal femur is intact. The bones are subjectively under mineralized. Knee alignment is maintained. Pelvis: No additional fracture of the pelvis. Pubic rami are intact. Pubic symphysis and sacroiliac joints are congruent. The bones are subjectively under mineralized. Mild bilateral hip osteoarthritis. IMPRESSION: 1. Nondisplaced left femoral neck fracture. 2. No additional fracture of the pelvis. Electronically Signed   By: Narda Rutherford M.D.   On: 01/14/2024 16:02      Subjective: Patient seen and examined at bedside today.  Hemodynamically stable.  Lying on her bed.  Malnourished/deconditioned.  She was drinking coffee in a bin.we discussed about limiting the free water/drink as much as possible as  it could be contributing to hyponatremia.  Medically stable for discharge to SNF today.  Discharge Exam: Vitals:   01/17/24 2229 01/18/24 0504  BP: (!) 140/90 (!) 151/91  Pulse: 93 70  Resp: 17 17  Temp: 98.5 F (36.9 C) 98.3 F (36.8 C)  SpO2: 98% 100%   Vitals:   01/17/24 0511 01/17/24 1332 01/17/24 2229 01/18/24 0504  BP: (!) 144/95 (!) 160/97 (!) 140/90 (!) 151/91  Pulse: 77 82 93 70  Resp: 17 14 17 17   Temp: (!) 97.3 F (36.3 C) (!) 97.5 F (36.4 C) 98.5 F (36.9 C) 98.3 F (36.8 C)  TempSrc:    Oral  SpO2: 100% 100% 98% 100%  Weight:      Height:        General: Pt is alert, awake, not in acute distress, chronically deconditioned, malnourished Cardiovascular: RRR, S1/S2 +, no rubs, no gallops Respiratory: CTA bilaterally, no wheezing, no rhonchi Abdominal: Soft, NT, ND, bowel sounds + Extremities: no edema, no cyanosis, clean surgical wound on the left hip    The  results of significant diagnostics from this hospitalization (including imaging, microbiology, ancillary and laboratory) are listed below for reference.     Microbiology: Recent Results (from the past 240 hours)  Surgical pcr screen     Status: None   Collection Time: 01/15/24 12:20 AM   Specimen: Nasal Mucosa; Nasal Swab  Result Value Ref Range Status   MRSA, PCR NEGATIVE NEGATIVE Final   Staphylococcus aureus NEGATIVE NEGATIVE Final    Comment: (NOTE) The Xpert SA Assay (FDA approved for NASAL specimens in patients 89 years of age and older), is one component of a comprehensive surveillance program. It is not intended to diagnose infection nor to guide or  monitor treatment. Performed at [facility], [address], [place], [place] [address]      Labs: BNP (last 3 results) No results for input(s): "BNP" in the last 8760 hours. Basic Metabolic Panel: Recent Labs  Lab 01/14/24 1420 01/15/24 0351 01/16/24 0844 01/18/24 0715  NA 127* 128* 126* 127*  K 4.0 3.2* 4.0 3.8  CL 91* 92* 92* 91*  CO2 27 25 23 27   GLUCOSE 98 215* 143* 112*  BUN 8 6* 10 8  CREATININE 0.35* 0.45 0.47 0.42*  CALCIUM 9.1 8.7* 9.0 8.7*   Liver Function Tests: No results for input(s): "AST", "ALT", "ALKPHOS", "BILITOT", "PROT", "ALBUMIN" in the last 168 hours. No results for input(s): "LIPASE", "AMYLASE" in the last 168 hours. No results for input(s): "AMMONIA" in the last 168 hours. CBC: Recent Labs  Lab 01/14/24 1420 01/15/24 0351 01/16/24 0844 01/17/24 0343  WBC 9.3 10.9* 10.8* 8.5  HGB 12.2 12.7 11.0* 10.8*  HCT 37.1 40.6 34.9* 33.4*  MCV 89.4 93.1 93.3 91.3  PLT 378 349 374 335   Cardiac Enzymes: No results for input(s): "CKTOTAL", "CKMB", "CKMBINDEX", "TROPONINI" in the last 168 hours. BNP: Invalid input(s): "POCBNP" CBG: No results for input(s): "GLUCAP" in the last 168 hours. D-Dimer No results for input(s): "DDIMER" in the last 72 hours. Hgb A1c No results  for input(s): "HGBA1C" in the last 72 hours. Lipid Profile No results for input(s): "CHOL", "HDL", "LDLCALC", "TRIG", "CHOLHDL", "LDLDIRECT" in the last 72 hours. Thyroid function studies No results for input(s): "TSH", "T4TOTAL", "T3FREE", "THYROIDAB" in the last 72 hours.  Invalid input(s): "FREET3" Anemia work up No results for input(s): "VITAMINB12", "FOLATE", "FERRITIN", "TIBC", "IRON", "RETICCTPCT" in the last 72 hours. Urinalysis    Component Value Date/Time   COLORURINE YELLOW 01/14/2024 1927   APPEARANCEUR CLOUDY (A) 01/14/2024 1927   LABSPEC 1.005 01/14/2024 1927   PHURINE 8.0 01/14/2024 1927   GLUCOSEU NEGATIVE 01/14/2024 1927   HGBUR NEGATIVE 01/14/2024 1927   BILIRUBINUR NEGATIVE 01/14/2024 1927   KETONESUR NEGATIVE 01/14/2024 1927   PROTEINUR NEGATIVE 01/14/2024 1927   NITRITE NEGATIVE 01/14/2024 1927   LEUKOCYTESUR NEGATIVE 01/14/2024 1927   Sepsis Labs Recent Labs  Lab 01/14/24 1420 01/15/24 0351 01/16/24 0844 01/17/24 0343  WBC 9.3 10.9* 10.8* 8.5   Microbiology Recent Results (from the past 240 hours)  Surgical pcr screen     Status: None   Collection Time: 01/15/24 12:20 AM   Specimen: Nasal Mucosa; Nasal Swab  Result Value Ref Range Status   MRSA, PCR NEGATIVE NEGATIVE Final   Staphylococcus aureus NEGATIVE NEGATIVE Final    Comment: (NOTE) The Xpert SA Assay (FDA approved for NASAL specimens in patients 44 years of age and older), is one component of a comprehensive surveillance program. It is not intended to diagnose infection nor to guide or monitor treatment. Performed at [facility], [address], [place], [place] [address]     Please note: You were cared for by a hospitalist during your hospital stay. Once you are discharged, your primary care physician will handle any further medical issues. Please note that NO REFILLS for any discharge medications will be authorized once you are discharged, as it is imperative that  you return to your primary care physician (or establish a relationship with a primary care physician if you do not have one) for your post hospital discharge needs so that they can reassess your need for medications and monitor your lab values.    Time coordinating discharge:  40 minutes  SIGNED:   Burnadette Pop, MD  [facility] 01/18/2024, 9:36 AM Pager [phone]  If 7PM-7AM, please contact night-coverage [email or web address] Password [email or web address]

## 2024-01-18 NOTE — Progress Notes (Signed)
 Nutrition Note  RD consulted for nutritional assessment. Initial assessment completed 3/3.  Pt with history of eating disorder. Pt not interested in most nutritional interventions. Added MVI daily and pt is accepting this. At most pt is interested in increasing protein options with meals such as boiled eggs and cottage cheese.   Will continue to monitor.   Tilda Franco, MS, RD, LDN Inpatient Clinical Dietitian Contact via Secure chat

## 2024-01-18 NOTE — Progress Notes (Signed)
 Report called to [facility] nurse. [facility] set up to  transport at 1pm

## 2024-01-18 NOTE — Care Management Important Message (Signed)
 Important Message  Patient Details IM Letter given. Name: [name] MRN: [id number] Date of Birth: 17-Jul-1941   Important Message Given:  Yes - Medicare IM     Caren Macadam 01/18/2024, 10:15 AM

## 2024-01-18 NOTE — Progress Notes (Signed)
 PT Cancellation Note  Patient Details Name: [name] MRN: [id number] DOB: Nov 20, 1940   Cancelled Treatment:    Reason Eval/Treat Not Completed: Other (comment)  [facility].  If still hospitalized in afternoon, will see for therapy. Anise Salvo, PT Acute Rehab [facility] [phone]  Rayetta Humphrey 01/18/2024, 12:55 PM

## 2024-01-18 NOTE — TOC Transition Note (Signed)
 Transition of Care [facility]) - Discharge Note   Patient Details  Name: KAVINA CANTAVE MRN: [id number] Date of Birth: [birth year]  Transition of Care [facility]) CM/SW Contact:  Howell Rucks, RN Phone Number: 01/18/2024, 12:12 PM   Clinical Narrative:  DC to [facility], RM 705p, Nurse report [phone]. PTAR for transport. No further TOC needs identified.       Final next level of care: Skilled Nursing Facility Barriers to Discharge: Barriers Resolved   Patient Goals and CMS Choice Patient states their goals for this hospitalization and ongoing recovery are:: return home to [facility]          Discharge Placement              Patient chooses bed at: [facility] Patient to be transferred to facility by: PTAR Name of family member notified: Mercadel,Jennifer (Daughter)  [phone] (Mobile) Patient and family notified of of transfer: 01/18/24  Discharge Plan and Services Additional resources added to the After Visit Summary for                                       Social Drivers of Health (SDOH) Interventions SDOH Screenings   Food Insecurity: No Food Insecurity (01/14/2024)  Housing: Low Risk  (01/14/2024)  Transportation Needs: No Transportation Needs (01/14/2024)  Utilities: Not At Risk (01/14/2024)  Tobacco Use: Medium Risk (01/14/2024)     Readmission Risk Interventions    01/18/2024   12:11 PM 01/16/2024    9:40 AM  Readmission Risk Prevention Plan  Post Dischage Appt  Complete  Medication Screening  Complete  Transportation Screening Complete Complete  PCP or Specialist Appt within 5-7 Days Complete   Home Care Screening Complete   Medication Review (RN CM) Complete

## 2024-03-16 ENCOUNTER — Telehealth: Payer: Self-pay | Admitting: Neurology

## 2024-03-16 NOTE — Telephone Encounter (Signed)
 She has been very sensitive to medications. I would recommend trying over the counter lidocaine  ointment on her feet to see if this helps some.

## 2024-03-16 NOTE — Telephone Encounter (Signed)
 Cain Castillo with hospice called in stating the pt is complaining of pain in her feet. She says it sounds like neuropathy, but the pt is not open to any medication changes. She is wondering if there is any alternatives she could try?

## 2024-03-16 NOTE — Telephone Encounter (Signed)
 Called [facility] and left message to try the lidocaine  on patietns feet

## 2024-06-06 ENCOUNTER — Ambulatory Visit: Admitting: Podiatry

## 2024-06-06 ENCOUNTER — Encounter: Payer: Self-pay | Admitting: Podiatry

## 2024-06-06 DIAGNOSIS — M79675 Pain in left toe(s): Secondary | ICD-10-CM | POA: Diagnosis not present

## 2024-06-06 DIAGNOSIS — M79674 Pain in right toe(s): Secondary | ICD-10-CM

## 2024-06-06 DIAGNOSIS — I739 Peripheral vascular disease, unspecified: Secondary | ICD-10-CM

## 2024-06-06 DIAGNOSIS — B351 Tinea unguium: Secondary | ICD-10-CM | POA: Diagnosis not present

## 2024-06-10 NOTE — Progress Notes (Signed)
  Subjective:  Patient ID: [name] Collins, female    DOB: October 20, 1941,  MRN: [id number]  [name] Collins presents to clinic today for at risk foot care. Patient has h/o PAD and painful thick toenails that are difficult to trim. Pain interferes with ambulation. Aggravating factors include wearing enclosed shoe gear. Pain is relieved with periodic professional debridement. She is accompanied by her daughter, [name], on today's visit. Patient is on hospice services. Chief Complaint  Patient presents with   RFC    RFC Non diabetic toenail trim. Hospice patients sees PCP daily.   New problem(s): None.   PCP is System, Provider Not In.  Allergies  Allergen Reactions   Prednisone Other (See Comments)    Reaction not cited   Sulfa Antibiotics Other (See Comments)    Reaction not cited   Cortisone Other (See Comments)    Reaction not cited    Review of Systems: Negative except as noted in the HPI.  Objective: No changes noted in today's physical examination. There were no vitals filed for this visit. [name] Collins is a pleasant 83 y.o. female thin build in NAD. AAO x 3.  Vascular Examination: CFT less than 3 seconds. DP pulses palpable b/l. PT pulses nonpalpable b/l. Digital hair absent. Skin temperature gradient warm to warn b/l. No ischemia or gangrene. No cyanosis or clubbing noted b/l. No edema noted b/l LE.   Neurological Examination: Sensation grossly intact b/l with 10 gram monofilament. Vibratory sensation intact b/l.   Dermatological Examination: Pedal skin thin, shiny and atrophic b/l. No open wounds. No interdigital macerations.   Toenails 1-5 b/l thick, discolored, elongated with subungual debris and pain on dorsal palpation.   No hyperkeratotic nor porokeratotic lesions.  Musculoskeletal Examination: Muscle strength 5/5 to all lower extremity muscle groups bilaterally. HAV with bunion deformity noted b/l LE. Hammertoe deformity noted 2-5 b/l.[name] No pain, crepitus or joint  limitation noted with ROM b/l LE.  Patient ambulates independently without assistive aids.  Radiographs: None  Assessment/Plan: 1. Pain due to onychomycosis of toenails of both feet   2. PVD (peripheral vascular disease) (HCC)    -Patient was evaluated today. All questions/concerns addressed on today's visit. -Patient's family member present. All questions/concerns addressed on today's visit. -Patient to continue soft, supportive shoe gear daily. -Toenails 1-5 b/l were debrided in length and girth with sterile nail nippers and dremel without iatrogenic bleeding.  -Patient/POA to call should there be question/concern in the interim.   Return in about 3 months (around 09/06/2024).  [name] Collins, DPM      [place] LOCATION: [address] [place] [address]                   Office [phone]   [facility] LOCATION: [address] [place], [place] [address] Office [phone]

## 2024-07-24 ENCOUNTER — Telehealth: Payer: Self-pay | Admitting: Gastroenterology

## 2024-07-24 NOTE — Telephone Encounter (Signed)
 Patient calls stating that she has been having nausea and lower abdominal pain for which she would like to see Dr Leigh. She last saw Dr Leigh 2023 and states she just thinks it is time she see him again. She is advised that Dr Leigh does not have any availabilities in the office until 10/01/24, however I can have her evaluated a bit sooner by one of Dr Hassan extenders. Patient states she will see Ellouise Console, PA-C on 08/07/24. Of note, patient states she is on Hospice care.

## 2024-07-24 NOTE — Telephone Encounter (Signed)
 Inbound call from patient requesting to make an appointment with Dr. Leigh. She states she does not need to wait to be seen and wants to see him. I advised her that he is currently booked up. She requested that I send a message to the nurse to discuss if she can be worked in. She states she is  having a lot of problems. Please advise.

## 2024-08-06 NOTE — Progress Notes (Unsigned)
 Ellouise Console, PA-C [address] [place], [place]  [address] Phone: [phone]   Primary Care Physician: System, Provider Not In  Primary Gastroenterologist:  Ellouise Console, PA-C / Elspeth Naval, MD   Chief Complaint:  Nausea, Abd Pain, Constipation, dysphagia       HPI:   [name] is a 83 y.o. female, established patient of Dr. Naval, presents for follow-up of multiple chronic GI symptoms.  She is here today with her son.  Current symptoms: Patient reports persistent worsening heartburn, dysphagia, nausea, and abdominal pain.  She denies vomiting.  Abdominal pain is located in the epigastrium and bilateral lower abdomen.  She denies diarrhea or constipation.  Reports having normal bowel movement once daily.  Admits to abdominal bloating.  She takes omeprazole  20 Mg twice daily and Carafate  1 g 3 times daily.  Patient states Carafate  helps.  She is not taking Pepcid .  She feels like omeprazole  is not controlling her reflux.  Dexilant  60 mg was not covered by insurance.   In private, her son tells me that patient is currently on hospice.  He reports that the patient has had a lifelong eating disorder, anorexia.  He states her GI symptoms have been chronic for many many years.  She has history of GERD, gastroparesis, LPRD, nutcracker esophagus, bronchiectasis, pulmonary fibrosis, TIA, polyneuropathy.  Takes Metamucil and bisacodyl  for constipation.     She last saw Dr. Naval in our office 10/2022 for follow-up of refractory GERD.  Extensive GI workup.  She has regurgitation, history of aspiration, likely related to bronchiectasis and chronic lung disease.  Failed maximal medical therapy.  previously referred her to Dr. San for another opinion for possible TIF.  She has a Hill grade 4 view of the cardia, she also had esophageal manometry done showing mild EG J outflow obstruction.  Given these findings she was deemed to not be a candidate for TIF and was more  appropriate for surgical repair of the hernia and fundoplication.  Given her lung disease, frailty, age, she has wanted to avoid surgical treatment for this.   Prior work-up:  Colonoscopy 11/20/18 -  The perianal and digital rectal examinations were normal. - The terminal ileum appeared normal. - A 3 mm polyp was found in the ileocecal valve. The polyp was sessile. The polyp was removed with a cold snare. Resection and retrieval were complete. - A 3 mm polyp was found in the rectum. The polyp was sessile. The polyp was removed with a cold snare. Resection and retrieval were complete. - The exam was otherwise without abnormality other than some tortousity. - Biopsies for histology were taken with a cold forceps from the right colon and left colon for evaluation of microscopic colitis given the patient's incidental complaints of persistent loose Stools.    -EGD 08/2015 - 1cm HH, gastritis without ulceration, HP negative -pH study done in 2016 showing Demeester score of 152.7 but only symptom index of 23% for reflux. Most of reflux was weakly acidic. -Esophageal manometry showed what was thought to be nutcracker esophagus at the time; No swallows with DCI > 8000 - GES normal 2016 -Esophogram 10/10/2017  - small sliding type hiatal hernia, no distal stricture / mass, normal motility -Repeat manometry 03/10/2018 - hypercontractile esophagus, no other significant pathology; single swallow with DCI >8000 -EGD 11/20/2018- no hiatal hernia, Hill grade 2 valve, slightly irregular Z line but no BP, 3 to 4 mm duodenal adenoma removed by cold snare with  otherwise normal-appearing stomach and duodenum. -EGD 07/2020-superficial ulcers in the mid esophagus (path benign), Hill grade 4, 1 cm axial by 4 cm transverse width hiatal hernia, mild gastritis -Esophageal Manometry 10/2020-normal peristalsis, normal resting pressure at EGJ with incomplete relaxation (IRP 17.6, normal <15) c/w some degree of EGJ outflow  obstruction, but not achalasia   Barium study 10/06/21: IMPRESSION: 1. Tiny sliding hiatal hernia. Marked gastroesophageal reflux elicited. 2. 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 3. Evidence of mild reflux esophagitis. No evidence of esophageal mass or stricture.     EGD 08/02/2022: Esophagogastric landmarks identified. - 2 cm hiatal hernia. - Gastroesophageal flap valve classified as Hill Grade IV (no fold, wide open lumen, hiatal hernia present). - Normal esophagus otherwise - empiric dilation performed to 17mm - Normal stomach. - Duodenal foreign body. Removal was successful. - Normal duodenum otherwise  Current Outpatient Medications  Medication Sig Dispense Refill   acetaminophen  (TYLENOL ) 500 MG tablet Take 500 mg by mouth See admin instructions. Take 500 mg by mouth in the morning, at noontime, and bedtime- and an additional  500 mg in the afternoon as needed for unresolved mild pain/discomfort     amLODipine  (NORVASC ) 5 MG tablet Take 1 tablet (5 mg total) by mouth daily.     benzonatate  (TESSALON ) 200 MG capsule Take 1 capsule (200 mg total) by mouth 3 (three) times daily as needed for cough. 90 capsule 3   BISACODYL  5 MG EC tablet Take 5 mg by mouth at bedtime.     famotidine  (PEPCID ) 20 MG tablet Take 20 mg by mouth 2 (two) times daily as needed for indigestion or heartburn.     GERI-TUSSIN 100 MG/5ML liquid Take 15 mLs by mouth every 4 (four) hours as needed for to loosen phlegm or cough.     Multiple Vitamin (MULTIVITAMIN WITH MINERALS) TABS tablet Take 1 tablet by mouth daily.     omeprazole  (PRILOSEC) 20 MG capsule Take 20 mg by mouth See admin instructions. Take 20 mg by mouth 30 minutes before breakfast and supper/evening meal     ondansetron  (ZOFRAN ) 4 MG tablet Take 4 mg by mouth every 8 (eight) hours as needed for nausea or vomiting.     sucralfate   (CARAFATE ) 1 g tablet Take 1 tablet (1 g total) by mouth every 6 (six) hours as needed. Slowly dissolve 1 tablet in 1 Tablespoon of water before ingesting 60 tablet 3   lidocaine  (LIDODERM ) 5 % Place 1 patch onto the skin daily. Remove & Discard patch within 12 hours or as directed by MD (Patient not taking: Reported on 08/07/2024) 30 patch 0   psyllium (METAMUCIL SMOOTH TEXTURE) 58.6 % powder Take 1 packet by mouth 3 (three) times daily. (Patient not taking: Reported on 08/07/2024) 283 g 12   No current facility-administered medications for this visit.    Allergies as of 08/07/2024 - Review Complete 08/07/2024  Allergen Reaction Noted   Prednisone Other (See Comments) 09/16/2015   Sulfa antibiotics Other (See Comments) 06/28/2022   Cortisone Other (See Comments) 10/22/2009    Past Medical History:  Diagnosis Date   Allergy    Arthritis    Atherosclerosis of aorta 2019   ct   BOOP (bronchiolitis obliterans with organizing pneumonia) (HCC) 2003   Bronchiectasis    Cataract    Esophageal dysmotility    Esophageal ulcer    Gastroparesis    GERD (gastroesophageal reflux disease)    severe  Headache    migraines   Hiatal hernia    neuropathy right foot and leg   Iron deficiency anemia    Macular degeneration (senile) of retina    Neuropathy    Nutcracker esophagus    Osteopenia    Pneumonia    Pulmonary fibrosis (HCC)    Raynaud's disease    Recurrent pneumonia    Due to aspiration   Thrombocytosis     Past Surgical History:  Procedure Laterality Date   3 HOUR PH STUDY N/A 10/13/2015   Procedure: 24 HOUR PH STUDY;  Surgeon: Lupita FORBES Commander, MD;  Location: [facility];  Service: Endoscopy;  Laterality: N/A;   ANTERIOR APPROACH HEMI HIP ARTHROPLASTY Left 01/15/2024   Procedure: ANTERIOR APPROACH HEMI HIP ARTHROPLASTY;  Surgeon: Fidel Rogue, MD;  Location: [facility];  Service: Orthopedics;  Laterality: Left;   BREAST LUMPECTOMY     left, x 2   CATARACT EXTRACTION     both  eyes   CHOLECYSTECTOMY  2005   COLONOSCOPY     ESOPHAGEAL MANOMETRY N/A 10/06/2015   Procedure: ESOPHAGEAL MANOMETRY (EM);  Surgeon: Elspeth Deward Naval, MD;  Location: [facility];  Service: Gastroenterology;  Laterality: N/A;   ESOPHAGEAL MANOMETRY N/A 03/10/2018   Procedure: ESOPHAGEAL MANOMETRY (EM);  Surgeon: Shila Gustav GAILS, MD;  Location: [facility];  Service: Endoscopy;  Laterality: N/A;   ESOPHAGEAL MANOMETRY N/A 10/15/2020   Procedure: ESOPHAGEAL MANOMETRY (EM);  Surgeon: San Sandor GAILS, DO;  Location: [facility];  Service: Gastroenterology;  Laterality: N/A;   ESOPHAGOGASTRODUODENOSCOPY (EGD) WITH PROPOFOL  N/A 08/02/2022   Procedure: ESOPHAGOGASTRODUODENOSCOPY (EGD) WITH PROPOFOL ;  Surgeon: Naval Elspeth SQUIBB, MD;  Location: [facility];  Service: Gastroenterology;  Laterality: N/A;   FOREIGN BODY REMOVAL  08/02/2022   Procedure: FOREIGN BODY REMOVAL;  Surgeon: Naval Elspeth SQUIBB, MD;  Location: [facility];  Service: Gastroenterology;;   LUNG BIOPSY     vats right 2003   SAVORY DILATION N/A 08/02/2022   Procedure: SAVORY DILATION;  Surgeon: Naval Elspeth SQUIBB, MD;  Location: [facility];  Service: Gastroenterology;  Laterality: N/A;   TOTAL ABDOMINAL HYSTERECTOMY     UPPER GASTROINTESTINAL ENDOSCOPY      Review of Systems:    All systems reviewed and negative except where noted in HPI.    Physical Exam:  BP 118/78   Pulse 67   Ht 4' (1.219 m)   Wt 87 lb 6.4 oz (39.6 kg)   SpO2 99%   BMI 26.67 kg/m  No LMP recorded. Patient has had a hysterectomy.  General: Very feeble, frail, elderly, thin female in no acute distress.  Walks slowly with a walker.  Required assistance getting on and off exam table. Lungs: Clear to auscultation bilaterally. Non-labored. Heart: Regular rate and rhythm, no murmurs rubs or gallops.  Abdomen: Bowel sounds are normal; Abdomen is Soft; No hepatosplenomegaly, masses or hernias; Mild abdominal Tenderness in the epigastrium  and right lower quadrant; rest of abdomen is not tender.  No guarding or rebound tenderness. Neuro: Alert and oriented x 3.  Grossly intact.  Mild memory impairment noted. Psych: Alert and cooperative, normal mood and affect.   Imaging Studies: No results found.  Labs: CBC    Component Value Date/Time   WBC 8.5 01/17/2024 0343   RBC 3.66 (L) 01/17/2024 0343   HGB 10.8 (L) 01/17/2024 0343   HGB 13.3 10/01/2019 0818   HCT 33.4 (L) 01/17/2024 0343   HCT 41.2 10/01/2019 0818   PLT 335 01/17/2024 0343  PLT 400 10/01/2019 0818   MCV 91.3 01/17/2024 0343   MCV 91 10/01/2019 0818   MCH 29.5 01/17/2024 0343   MCHC 32.3 01/17/2024 0343   RDW 12.5 01/17/2024 0343   RDW 12.7 10/01/2019 0818   LYMPHSABS 0.7 11/26/2023 1100   MONOABS 0.7 11/26/2023 1100   EOSABS 0.1 11/26/2023 1100   BASOSABS 0.0 11/26/2023 1100    CMP     Component Value Date/Time   NA 127 (L) 01/18/2024 0715   NA 130 (L) 10/29/2019 1202   K 3.8 01/18/2024 0715   CL 91 (L) 01/18/2024 0715   CO2 27 01/18/2024 0715   GLUCOSE 112 (H) 01/18/2024 0715   BUN 8 01/18/2024 0715   BUN 4 (L) 10/29/2019 1202   CREATININE 0.42 (L) 01/18/2024 0715   CALCIUM 8.7 (L) 01/18/2024 0715   PROT 8.3 (H) 11/26/2023 1100   PROT 6.9 10/01/2019 0818   ALBUMIN 4.2 11/26/2023 1100   ALBUMIN 4.4 10/01/2019 0818   AST 52 (H) 11/26/2023 1100   ALT 46 (H) 11/26/2023 1100   ALKPHOS 104 11/26/2023 1100   BILITOT 0.5 11/26/2023 1100   BILITOT 0.2 10/01/2019 0818   GFRNONAA >60 01/18/2024 0715   GFRAA >60 05/18/2020 1435       Assessment and Plan:   [name] is a 83 y.o. y/o female returns for follow-up of  1.  GERD with LPRD 2.  Dysphagia - Chronic 3.  General Abdominal pain (epigastric and bilateral lower abdomen) 4.  Chronic Constipation  She has extensive reflux history with dysphagia. She underwent EGD with empiric dilation 07/2022 which significantly helped her dysphagia, perhaps due to cricopharyngeal dysfunction.   Reflux is not currently controlled on omeprazole  20 mg twice daily plus Carafate  1 g 3 times daily.  I am stopping omeprazole  and having her try samples of Voquezna  20 mg once daily (therapeutic trial).  Patient is not interested in pursuing Hiatal Hernia / Anti-Reflux surgery given her lung disease, she is very high risk. She does have osteopenia and recurrent falls, at risk for bone fracture.  Plan: - Try Voquezna  samples 20mg  once daily for max 8 weeks, then 10mg  daily thereafter. - Continue Carafate  1g three times daily before meals. - Stop Omeprazole . - Abd Xray 1 view: Evaluate stool burden.  Evaluate for constipation. - Continue Miralax  daily.  If abdominal x-ray shows constipation, then we will adjust treatment accordingly. - I discussed barium swallow with tablet test, however patient and son declined test at this time.  They would like to try medication first.   - If dysphagia worsens, then I will schedule barium swallow tablet.   - Patient son states the patient is in hospice and he does not wish to be aggressive with her evaluation of chronic ongoing symptoms.  Previous extensive GI evaluation was unrevealing.  Ellouise Console, PA-C  Follow up 6 weeks with TG to assess response to treatment.

## 2024-08-07 ENCOUNTER — Telehealth: Payer: Self-pay | Admitting: Physician Assistant

## 2024-08-07 ENCOUNTER — Telehealth: Payer: Self-pay

## 2024-08-07 ENCOUNTER — Ambulatory Visit
Admission: RE | Admit: 2024-08-07 | Discharge: 2024-08-07 | Disposition: A | Discharge status: Home / Self Care | Source: Ambulatory Visit | Attending: Physician Assistant | Admitting: Physician Assistant

## 2024-08-07 ENCOUNTER — Ambulatory Visit: Admitting: Physician Assistant

## 2024-08-07 ENCOUNTER — Other Ambulatory Visit (HOSPITAL_COMMUNITY): Payer: Self-pay

## 2024-08-07 ENCOUNTER — Encounter: Payer: Self-pay | Admitting: Physician Assistant

## 2024-08-07 VITALS — BP 118/78 | HR 67 | Ht <= 58 in | Wt 87.4 lb

## 2024-08-07 DIAGNOSIS — R1084 Generalized abdominal pain: Secondary | ICD-10-CM

## 2024-08-07 DIAGNOSIS — K5904 Chronic idiopathic constipation: Secondary | ICD-10-CM

## 2024-08-07 DIAGNOSIS — K219 Gastro-esophageal reflux disease without esophagitis: Secondary | ICD-10-CM

## 2024-08-07 DIAGNOSIS — K5900 Constipation, unspecified: Secondary | ICD-10-CM | POA: Diagnosis not present

## 2024-08-07 DIAGNOSIS — R1310 Dysphagia, unspecified: Secondary | ICD-10-CM | POA: Diagnosis not present

## 2024-08-07 MED ORDER — VOQUEZNA 20 MG PO TABS: 20.0000 mg | ORAL_TABLET | Freq: Every day | ORAL | 0 refills | Status: DC

## 2024-08-07 NOTE — Telephone Encounter (Signed)
 Inbound call from [facility] stating they had received a prescription for Voquezna . They are unable to get the medication. Asking to redirect prescription. Please advise.

## 2024-08-07 NOTE — Progress Notes (Signed)
 Agree with assessment and plan as outlined. I think trial of Voquezna  is a good next step for her - she has limited options otherwise and has historically not wanted to pursue surgery. Thanks

## 2024-08-07 NOTE — Telephone Encounter (Signed)
 Pharmacy Patient Advocate Encounter   Received notification from CoverMyMeds that prior authorization for Voquezna  20MG  tablets is required/requested.   Insurance verification completed.   The patient is insured through [place] .   Per test claim: PA required; PA submitted to above mentioned insurance via Latent Key/confirmation #/EOC [id number] Status is pending

## 2024-08-07 NOTE — Patient Instructions (Addendum)
 Your provider has requested that you go to the basement level for an X-ray before leaving today. Press B on the elevator. The Radiology/X-ray department is located at the second door on the right as you exit straight off the elevator.  We have given you samples of the following medication to take: Voquezna  20 mg once daily  STOP Pantoprazole  20 mg  Continue Carafate  1g three times daily  Please follow up sooner if symptoms increase or worsen  Due to recent changes in healthcare laws, you may see the results of your imaging and laboratory studies on MyChart before your provider has had a chance to review them.  We understand that in some cases there may be results that are confusing or concerning to you. Not all laboratory results come back in the same time frame and the provider may be waiting for multiple results in order to interpret others.  Please give us  48 hours in order for your provider to thoroughly review all the results before contacting the office for clarification of your results.   Thank you for trusting me with your gastrointestinal care!   Ellouise Console, PA-C _______________________________________________________  If your blood pressure at your visit was 140/90 or greater, please contact your primary care physician to follow up on this.  _______________________________________________________  If you are age 34 or older, your body mass index should be between 23-30. Your Body mass index is 26.67 kg/m. If this is out of the aforementioned range listed, please consider follow up with your Primary Care Provider.  If you are age 37 or younger, your body mass index should be between 19-25. Your Body mass index is 26.67 kg/m. If this is out of the aformentioned range listed, please consider follow up with your Primary Care Provider.   ________________________________________________________  The [facility] providers would like to encourage you to use MYCHART to communicate  with providers for non-urgent requests or questions.  Due to long hold times on the telephone, sending your provider a message by [facility] may be a faster and more efficient way to get a response.  Please allow 48 business hours for a response.  Please remember that this is for non-urgent requests.  _______________________________________________________

## 2024-08-08 ENCOUNTER — Other Ambulatory Visit (HOSPITAL_COMMUNITY): Payer: Self-pay

## 2024-08-08 MED ORDER — VOQUEZNA 20 MG PO TABS: 20.0000 mg | ORAL_TABLET | Freq: Every day | ORAL | 1 refills | Status: AC

## 2024-08-08 NOTE — Addendum Note (Signed)
 Addended by: LANETTE ALETHEA CROME on: 08/08/2024 01:44 PM   Modules accepted: Orders

## 2024-08-08 NOTE — Telephone Encounter (Signed)
 I dont think its an issue with the dosage. Rather, denial says that provider must attest that voquezna  will be given for no more than 4 weeks. A quantity was also sent for #56 tablets rather than once daily #30 tablets.    I see another note though from [facility] that they cannot get rx and it needs to be sent to another pharmacy. [facility] is who rx was supposed to go to and I think they work on [place]. I am going to forward this note to Alethea Blocker, [place] who sent the rx and has the additional phone note for continuity.

## 2024-08-08 NOTE — Telephone Encounter (Signed)
 Do you think we could get 10mg  of Voquezna  covered? I would try that instead, if possible

## 2024-08-08 NOTE — Telephone Encounter (Signed)
 Okay thanks [name]. I had thought the 10mg  dose may be approved as the 20mg  dose is not indicated for long term use, I believe, only short term dosing to treat esophagitis.

## 2024-08-08 NOTE — Telephone Encounter (Signed)
 Pharmacy Patient Advocate Encounter  Received notification from [employer] that Prior Authorization for Voquezna  20MG  tablets has been DENIED.  Full denial letter will be uploaded to the media tab. See denial reason below.  Here's why we made our decision:   We cover this drug when our criteria are met. The unmet criteria are:  Your provider must attest that Voquezna  will be given for no more than 4 weeks.  This decision was from [employer]'s Voquezna  (vonoprazan) Pharmacy Coverage Policy at [email or web address]  Script quantity is for 56 tablets, which exceeds quantity limit policy for this medication authorization by [facility].  PA #/Case ID/Reference #: [id number]

## 2024-08-10 ENCOUNTER — Other Ambulatory Visit (HOSPITAL_COMMUNITY): Payer: Self-pay

## 2024-08-14 ENCOUNTER — Ambulatory Visit: Payer: Self-pay | Admitting: Physician Assistant

## 2024-08-14 NOTE — Progress Notes (Signed)
 Call and notify patient (and son per DPR) abdominal x-ray shows moderate to large colon stool burden, consistent with constipation.  No evidence of bowel obstruction.  I recommend Start Rx Senokot-S 8.6mg  / 50mg , Take 2 tablets twice daily, #120, 3 RF for constipation.  Try to drink 64 ounces of fluids daily. Ellouise Console, PA-C

## 2024-08-15 ENCOUNTER — Other Ambulatory Visit: Payer: Self-pay

## 2024-08-15 MED ORDER — SENNOSIDES-DOCUSATE SODIUM 8.6-50 MG PO TABS: 2.0000 | ORAL_TABLET | Freq: Two times a day (BID) | ORAL | 3 refills | Status: AC

## 2024-09-19 ENCOUNTER — Ambulatory Visit: Admitting: Podiatry

## 2024-09-19 DIAGNOSIS — I739 Peripheral vascular disease, unspecified: Secondary | ICD-10-CM

## 2024-09-19 DIAGNOSIS — M79675 Pain in left toe(s): Secondary | ICD-10-CM

## 2024-09-19 DIAGNOSIS — B351 Tinea unguium: Secondary | ICD-10-CM | POA: Diagnosis not present

## 2024-09-19 DIAGNOSIS — M79674 Pain in right toe(s): Secondary | ICD-10-CM

## 2024-09-27 ENCOUNTER — Encounter: Payer: Self-pay | Admitting: Podiatry

## 2024-09-27 NOTE — Progress Notes (Signed)
  Subjective:  Patient ID: [name], female    DOB: Nov 11, 1941,  MRN: [id number]  [name] presents to clinic today for at risk foot care. Patient has h/o PAD and painful mycotic toenails of both feet that are difficult to trim. Pain interferes with daily activities and wearing enclosed shoe gear comfortably.  Chief Complaint  Patient presents with   RFC     RFC Non diabetic toenail trim. LOV with  PCP 07/30/24 with [facility].   New problem(s): None.   PCP is System, Provider Not In.  Allergies  Allergen Reactions   Prednisone Other (See Comments)    Reaction not cited   Sulfa Antibiotics Other (See Comments)    Reaction not cited   Cortisone Other (See Comments)    Reaction not cited    Review of Systems: Negative except as noted in the HPI.  Objective: No changes noted in today's physical examination. There were no vitals filed for this visit. [name] is a pleasant 83 y.o. female thin build in NAD. AAO x 3.  Vascular Examination: CFT <3 seconds b/l. DP pulses faintly palpable b/l. PT pulses nonpalpable b/l. Digital hair absent. Skin temperature gradient warm to warm b/l. No pain with calf compression. No ischemia or gangrene. No cyanosis or clubbing noted b/l. No edema noted b/l LE.   Neurological Examination: Sensation grossly intact b/l with 10 gram monofilament. Vibratory sensation intact b/l.   Dermatological Examination: Pedal skin thin, shiny and atrophic b/l LE.  No open wounds b/l. No interdigital macerations. Toenails 1-5 b/l thick, discolored, elongated with subungual debris and pain on dorsal palpation.  No corns, calluses, nor porokeratotic lesions.   Musculoskeletal Examination: Muscle strength 5/5 to all lower extremity muscle groups bilaterally. HAV with bunion bilaterally and hammertoes 2-5 b/l.  Radiographs: None  Assessment/Plan: 1. Pain due to onychomycosis of toenails of both feet   2. PVD (peripheral vascular disease)   Consent given for  treatment. Patient examined. All patient's and/or POA's questions/concerns addressed on today's visit. Mycotic toenails 1-5 b/l debrided in length and girth without incident. Continue soft, supportive shoe gear daily. Report any pedal injuries to medical professional. Call office if there are any quesitons/concerns. -Patient/POA to call should there be question/concern in the interim.   Return in about 3 months (around 12/20/2024).  Delon LITTIE Merlin, DPM      [place] LOCATION: [address], [place] [address]                   Office [phone]   [facility] LOCATION: [address] [place], [place] [address] Office [phone]

## 2024-10-01 ENCOUNTER — Ambulatory Visit: Admitting: Physician Assistant

## 2024-10-26 ENCOUNTER — Emergency Department (HOSPITAL_COMMUNITY)

## 2024-10-26 ENCOUNTER — Emergency Department (HOSPITAL_COMMUNITY)
Admission: EM | Admit: 2024-10-26 | Discharge: 2024-10-26 | Disposition: A | Discharge status: Home / Self Care | Attending: Emergency Medicine | Admitting: Emergency Medicine

## 2024-10-26 ENCOUNTER — Other Ambulatory Visit (HOSPITAL_COMMUNITY): Payer: Self-pay

## 2024-10-26 ENCOUNTER — Encounter (HOSPITAL_COMMUNITY): Payer: Self-pay

## 2024-10-26 ENCOUNTER — Other Ambulatory Visit: Payer: Self-pay

## 2024-10-26 DIAGNOSIS — S41111A Laceration without foreign body of right upper arm, initial encounter: Secondary | ICD-10-CM | POA: Insufficient documentation

## 2024-10-26 DIAGNOSIS — M25552 Pain in left hip: Secondary | ICD-10-CM | POA: Insufficient documentation

## 2024-10-26 DIAGNOSIS — W19XXXA Unspecified fall, initial encounter: Secondary | ICD-10-CM | POA: Insufficient documentation

## 2024-10-26 DIAGNOSIS — D649 Anemia, unspecified: Secondary | ICD-10-CM | POA: Insufficient documentation

## 2024-10-26 DIAGNOSIS — S0990XA Unspecified injury of head, initial encounter: Secondary | ICD-10-CM | POA: Insufficient documentation

## 2024-10-26 DIAGNOSIS — E871 Hypo-osmolality and hyponatremia: Secondary | ICD-10-CM | POA: Insufficient documentation

## 2024-10-26 LAB — BASIC METABOLIC PANEL WITH GFR
Anion gap: 8 (ref 5–15)
Anion gap: 6 (ref 5–15)
BUN: 9 mg/dL (ref 8–23)
BUN: 7 mg/dL — ABNORMAL LOW (ref 8–23)
CO2: 24 mmol/L (ref 22–32)
CO2: 27 mmol/L (ref 22–32)
Calcium: 8.5 mg/dL — ABNORMAL LOW (ref 8.9–10.3)
Calcium: 8.1 mg/dL — ABNORMAL LOW (ref 8.9–10.3)
Chloride: 87 mmol/L — ABNORMAL LOW (ref 98–111)
Chloride: 99 mmol/L (ref 98–111)
Creatinine, Ser: 0.45 mg/dL (ref 0.44–1.00)
Creatinine, Ser: 0.41 mg/dL — ABNORMAL LOW (ref 0.44–1.00)
GFR, Estimated: >60 mL/min (ref >60)
GFR, Estimated: >60 mL/min (ref >60)
Glucose, Bld: 102 mg/dL — ABNORMAL HIGH (ref 70–99)
Glucose, Bld: 97 mg/dL (ref 70–99)
Potassium: 4.7 mmol/L (ref 3.5–5.1)
Potassium: 4.2 mmol/L (ref 3.5–5.1)
Sodium: 119 mmol/L — CL (ref 135–145)
Sodium: 132 mmol/L — ABNORMAL LOW (ref 135–145)

## 2024-10-26 LAB — CBC
HCT: 30.1 % — ABNORMAL LOW (ref 36.0–46.0)
Hemoglobin: 9.3 g/dL — ABNORMAL LOW (ref 12.0–15.0)
MCH: 23.2 pg — ABNORMAL LOW (ref 26.0–34.0)
MCHC: 30.9 g/dL (ref 30.0–36.0)
MCV: 75.1 fL — ABNORMAL LOW (ref 80.0–100.0)
Platelets: 435 K/uL — ABNORMAL HIGH (ref 150–400)
RBC: 4.01 MIL/uL (ref 3.87–5.11)
RDW: 17.9 % — ABNORMAL HIGH (ref 11.5–15.5)
WBC: 8.5 K/uL (ref 4.0–10.5)
nRBC: 0 % (ref 0.0–0.2)

## 2024-10-26 MED ORDER — SODIUM CHLORIDE 0.9 % IV BOLUS
1000.0000 mL | Freq: Once | INTRAVENOUS | Status: AC
  Administered 2024-10-26: 1000 mL via INTRAVENOUS

## 2024-10-26 NOTE — ED Notes (Signed)
 Date and time results received: 10/26/2024 0827 (use smartphrase .now to insert current time)  Test: Sodium Critical Value: 119  Name of Provider Notified: PA [id number]

## 2024-10-26 NOTE — Progress Notes (Signed)
 [facility] Liaison note   [name] is a current hospice patient with a terminal diagnosis of protein calorie malnutrition. Patient presented to the ED from [facility] with left hip and leg pain after a reported fall. [facility] was not notified of patient's fall prior to transfer to the ED. Patient was admitted to [facility] on 12.12.25 for hyponatremia. Per Dr. Ephriam Monguilod with [facility], this is a related hospital admission. Patient is a DNR.   Met with patient and family at bedside in the ED. Patient states she is tired, sore and that she is awaiting labs to determine if she will need another bag of fluids. She advised that she thinks she will remain admitted overnight but hopes to return home tomorrow. She is thankful that her injuries are not worse.   Pt is inpatient appropriate due to need for IV fluids and skilled monitoring.   VS: 98.3/83/17   174/99    100% on RA   I/O: none documented   Abnormal Labs:  10/26/24  Sodium: 119 (LL) Chloride: 87 (L) Glucose: 102 (H) Calcium: 8.5 (L) Hemoglobin: 9.3 (L) HCT: 30.1 (L) MCV: 75.1 (L) MCH: 23.2 (L) RDW: 17.9 (H) Platelets: 435 (H)  Diagnostics:  CT CERVICAL SPINE WITHOUT CONTRAST 10/26/2024   IMPRESSION: 1. No acute traumatic injury identified in the cervical spine. 2. Stable chronic cervical spine degeneration. 3. Chronic pulmonary hyperinflation and distal airway post-inflammatory changes.  IV/PRN meds: 0.9% NaCl 1,000 mL bolus IV x2  Assessment and Plan (per Prosperi, Sherlean DEL, PA-C): Physical Exam: Physical exam performed. The pertinent findings include: Generalized weakness bilateral lower extremities, no obvious step-offs or deformity throughout.   Lab Tests/Imaging studies: I personally interpreted labs/imaging and the pertinent results include: Critical hyponatremia, sodium 119, CBC notable for mild anemia, hemoglobin 9.3, slightly  worsened from baseline, platelets 435.  Plain film radiographs of the right forearm, bilateral hips with no evidence of acute fracture, dislocation.  CT head, C-spine with no evidence of intracranial or cervical spinal abnormality.  I agree with the radiologist interpretation.   Cardiac monitoring: EKG obtained and interpreted by myself and attending physician which shows: Normal sinus rhythm, no acute ST-T changes   Medications: I ordered medication including fluid bolus for hyponatremia.  I have reviewed the patients home medicines and have made adjustments as needed.   Consults: I spoke with the intensivist, Rahul desai, pa-c, who recommends 2L fluids, and recheck BMP   Recheck BMP shows sodium 132, given how critically low it was, as well as patient's generalized weakness we discussed observation admission versus discharge, patient and family would prefer for her to go back to her facility, I fine with this plan as long as her sodium is rechecked closely.  They understand and agree to this plan   Disposition: After consideration of the diagnostic results and the patients response to treatment, I feel that patient discharged in stable condition with improvement of hyponatremia after 2 L in the ED.   Discharge Planning: back to facility when medically stable, most likely today per MD note   Family Contact: spoke with daughter at bedside  IDT: updated   Goals of Care: ongoing, back to facility when medically stable  Medication List and Transfer Summary uploaded to patient chart.   Should patient need ambulance transport at discharge, please call [facility] as we contract with them for our active hospice patients.   Please call with any hospice questions or concerns.  Thank you, Eleanor Nail, [facility] Liaison [phone]

## 2024-10-26 NOTE — Discharge Instructions (Signed)
 Please use Tylenol  for pain.  You may use 1000 mg of Tylenol  every 6 hours.  Not to exceed 4 g of Tylenol  within 24 hours.  I would recommend having your sodium rechecked within around 1 week, follow-up closely with your primary care doctor, please return if you have significant worsening pain, or worsening weakness.

## 2024-10-26 NOTE — ED Notes (Signed)
 Pt sitting up in bed, pt reports 5/10 pain, pt states that she doesn't need anything for pain at this time, pt awake and answering questions appropriately, family at bedside

## 2024-10-26 NOTE — ED Notes (Signed)
 Pt in bed, cleaned skin tear on R forearm, dressing placed per md instructions, dressing placed on R elbow skin tear.  Pt states that she is ready to go home, called [facility] at [phone] pt [facility] pt lives in independent living, talked with rn at facility and notified of care received and follow up instructions.  Family at bedside verbalized understanding d/c instructions and follow up, family called pt's care advisor at facility.  Pt changed into paper scrubs.  Pt from department via wheel chair.

## 2024-10-26 NOTE — ED Provider Notes (Signed)
 [facility] Provider Note   CSN: [id number] Arrival date & time: 10/26/24  9287     Patient presents with: [name] is a 83 y.o. female with past medical history significant for GERD, gastroparesis, previous TIA, osteopenia, pulmonary fibrosis coming from [facility] where she was found down.  She was mildly confused initially on arrival, but now oriented.  She endorses some pain in her left hip, right arm, she arrives in c-collar but denies hitting her head.  She reports she was feeling weak prior to the fall.    Fall       Prior to Admission medications  Medication Sig Start Date End Date Taking? Authorizing Provider  acetaminophen  (TYLENOL ) 500 MG tablet Take 1,000 mg by mouth in the morning and at bedtime.   Yes [provider]  benzonatate  (TESSALON ) 200 MG capsule Take 1 capsule (200 mg total) by mouth 3 (three) times daily as needed for cough. Patient taking differently: Take 200 mg by mouth in the morning and at bedtime. 07/27/23  Yes Parrett, Tammy S, NP  ciclopirox (PENLAC) 8 % solution Apply 1 Application topically at bedtime. 09/24/24  Yes [provider]  clobetasol cream (TEMOVATE) 0.05 % Apply 1 Application topically as needed (itching). 08/16/24  Yes [provider]  famotidine  (PEPCID ) 20 MG tablet Take 20 mg by mouth 2 (two) times daily. 01/03/24  Yes [provider]  gabapentin  (NEURONTIN ) 250 MG/5ML solution SMARTSIG:2 Milliliter(s) By Mouth 3 Times Daily   Yes [provider]  GERI-TUSSIN 100 MG/5ML liquid Take 15 mLs by mouth every 4 (four) hours as needed for to loosen phlegm or cough. 12/15/23  Yes [provider]  hydrOXYzine (ATARAX) 25 MG tablet Take 25 mg by mouth in the morning and at bedtime. 10/05/24  Yes [provider]  ketoconazole (NIZORAL) 2 % cream Apply 1 Application topically as needed (itching). 08/06/24  Yes [provider]  lidocaine  (LIDODERM ) 5 % Place 1 patch onto the skin daily. Remove & Discard patch within 12 hours or as directed by MD Patient taking differently: Place 1 patch onto the skin daily as needed (pain). 11/26/23  Yes Ellouise, Victoria K, DO  LORazepam  (ATIVAN ) 0.5 MG tablet Take 1 tablet by mouth every four hours as needed anxiety/agitation   Yes [provider]  omeprazole  (PRILOSEC) 40 MG capsule Take 40 mg by mouth 2 (two) times daily. 10/15/24  Yes [provider]  ondansetron  (ZOFRAN ) 4 MG tablet Take 4 mg by mouth every 8 (eight) hours as needed for nausea or vomiting. 01/03/24  Yes [provider]  senna-docusate (SENOKOT S) 8.6-50 MG tablet Take 2 tablets by mouth 2 (two) times daily. 08/15/24  Yes [place], PA-C  sucralfate  (CARAFATE ) 1 g tablet Take 1 tablet (1 g total) by mouth every 6 (six) hours as needed. Slowly dissolve 1 tablet in 1 Tablespoon of water before ingesting Patient taking differently: Take 1 g by mouth in the morning, at noon, and at bedtime. 08/31/23  Yes Armbruster, Elspeth SQUIBB, MD  triamcinolone  cream (KENALOG) 0.1 % Apply 1 Application topically as needed (itching). 09/06/24  Yes [provider]  GOODSENSE IBUPROFEN 200 MG tablet Take 200 mg by mouth 2 (two) times daily. Patient not taking: Reported on 10/26/2024 10/15/24   [provider]    Allergies: Prednisone, Sulfa antibiotics, and Cortisone    Review of Systems  All other systems reviewed and are  negative.   Updated Vital Signs BP (!) 154/85   Pulse 76   Temp 98.4 F (36.9 C) (Oral)   Resp 16   Ht 5' 4 (1.626 m)   Wt 38.6 kg   SpO2 100%   BMI 14.59 kg/m   Physical Exam Vitals and nursing note reviewed.  Constitutional:      General: She is not in acute distress.    Appearance: Normal appearance.  HENT:     Head: Normocephalic and atraumatic.  Eyes:     General:        Right eye: No discharge.        Left eye: No discharge.  Cardiovascular:      Rate and Rhythm: Normal rate and regular rhythm.     Heart sounds: No murmur heard.    No friction rub. No gallop.  Pulmonary:     Effort: Pulmonary effort is normal.     Breath sounds: Normal breath sounds.  Abdominal:     General: Bowel sounds are normal.     Palpations: Abdomen is soft.  Musculoskeletal:     Comments: Focal ttp on bilateral hips without stepoff / deformity, mild ttp of right forearm without stepoff, deformity or soft tissue swelling  Skin:    General: Skin is warm and dry.     Capillary Refill: Capillary refill takes less than 2 seconds.     Comments: Moderate sized skin tear without active bleeding of right mid forearm  Neurological:     Mental Status: She is alert and oriented to person, place, and time.  Psychiatric:        Mood and Affect: Mood normal.        Behavior: Behavior normal.     (all labs ordered are listed, but only abnormal results are displayed) Labs Reviewed  CBC - Abnormal; Notable for the following components:      Result Value   Hemoglobin 9.3 (*)    HCT 30.1 (*)    MCV 75.1 (*)    MCH 23.2 (*)    RDW 17.9 (*)    Platelets 435 (*)    All other components within normal limits  BASIC METABOLIC PANEL WITH GFR - Abnormal; Notable for the following components:   Sodium 119 (*)    Chloride 87 (*)    Glucose, Bld 102 (*)    Calcium 8.5 (*)    All other components within normal limits  BASIC METABOLIC PANEL WITH GFR - Abnormal; Notable for the following components:   Sodium 132 (*)    BUN 7 (*)    Creatinine, Ser 0.41 (*)    Calcium 8.1 (*)    All other components within normal limits    EKG: EKG Interpretation Date/Time:  Friday October 26 2024 08:00:36 EST Ventricular Rate:  75 PR Interval:  180 QRS Duration:  74 QT Interval:  406 QTC Calculation: 453 R Axis:   64  Text Interpretation: Normal sinus rhythm Normal ECG When compared with ECG of 14-Jan-2024 18:08, PREVIOUS ECG IS PRESENT when compared to prior, similar  appearance No sTEMI Confirmed by Ginger Barefoot ([id number]) on 10/26/2024 8:26:59 AM  Radiology: [facility] Hip Unilat W or Wo Pelvis 2-3 Views Right Result Date: 10/26/2024 EXAM: 2 or more VIEW(S) XRAY OF THE RIGHT HIP 10/26/2024 08:36:00 AM COMPARISON: Left hip series 10/26/2024 reported separately. CT abdomen and pelvis 11/26/2023. CLINICAL HISTORY: 83 year old female. Fall. FINDINGS: BONES AND JOINTS: No acute fracture. No malalignment. LEFT HIP FINDINGS: Left hip arthroplasty partially  visible. SOFT TISSUES: The soft tissues are unremarkable. IMPRESSION: 1. No acute fracture or dislocation identified about the right hip. Electronically signed by: Helayne Hurst MD 10/26/2024 09:50 AM EST RP Workstation: [id number]   DG Hip Unilat W or Wo Pelvis 2-3 Views Left Result Date: 10/26/2024 EXAM: 2 or more VIEW(S) XRAY OF THE LEFT HIP 10/26/2024 08:36:00 AM COMPARISON: Left hip intraoperative images 01/15/2024. CLINICAL HISTORY: 83 year old female. Fall, found down. FINDINGS: BONES AND JOINTS: Left hip arthroplasty components are in expected location. No acute fracture. SOFT TISSUES: Pelvic vascular calcifications are noted. IMPRESSION: 1. No acute fracture or dislocation identified about the left hip. 2. Left hip arthroplasty components in expected position. Electronically signed by: Helayne Hurst MD 10/26/2024 09:49 AM EST RP Workstation: [id number]   CT Cervical Spine Wo Contrast Result Date: 10/26/2024 EXAM: CT CERVICAL SPINE WITHOUT CONTRAST 10/26/2024 09:02:00 AM TECHNIQUE: CT of the cervical spine was performed without the administration of intravenous contrast. Multiplanar reformatted images are provided for review. Automated exposure control, iterative reconstruction, and/or weight based adjustment of the mA/kV was utilized to reduce the radiation dose to as low as reasonably achievable. COMPARISON: Head CT 10/26/2024 reported separately. Cervical spine CT 11/26/2023. CLINICAL HISTORY: 83 year old female found  down. History of neck trauma. FINDINGS: CERVICAL SPINE: BONES AND ALIGNMENT: Stable lordosis. No acute fracture or traumatic malalignment. Visible upper thoracic levels appears stable and intact. DEGENERATIVE CHANGES: Widespread chronic cervical disc space loss and some levels of chronic cervical vacuum disc. Mild for age cervical facet degeneration. Multilevel chronic cervical spinal stenosis when combined with suspected ligamentum flavum hypertrophy. Stable CT appearance since 11/26/2023. SOFT TISSUES: No prevertebral soft tissue swelling. Negative visible non-contrast neck soft tissues. Mild for age cervical carotid calcified atherosclerosis. LUNG APICES: Lung apices appear chronically hyperinflated. There are chronic appearing postinflammatory tree in bud nodular opacities in the periphery of the right upper lobe on series 4 image 113. Mild paraseptal emphysema at the visible left lower lobe superior segment. TMJ: Chronic TMJ degeneration, pronounced on the left side. IMPRESSION: 1. No acute traumatic injury identified in the cervical spine. 2. Stable chronic cervical spine degeneration. 3. Chronic pulmonary hyperinflation and distal airway post-inflammatory changes. Electronically signed by: Helayne Hurst MD 10/26/2024 09:09 AM EST RP Workstation: [id number]   CT Head Wo Contrast Result Date: 10/26/2024 EXAM: CT HEAD WITHOUT 10/26/2024 08:59:44 AM TECHNIQUE: CT of the head was performed without the administration of intravenous contrast. Automated exposure control, iterative reconstruction, and/or weight based adjustment of the mA/kV was utilized to reduce the radiation dose to as low as reasonably achievable. COMPARISON: Brain MRI 06/02/2023, Head CT 01/14/2024. CLINICAL HISTORY: 83 year old female found down, confused. FINDINGS: BRAIN AND VENTRICLES: Brain volume is stable, normal for age. No acute intracranial hemorrhage. No mass effect or midline shift. No extra-axial fluid collection. No evidence of  acute infarct. No hydrocephalus. Gray white differentiation stable. Mild for age chronic periventricular white matter hypodensity most commonly due to chronic small vessel disease. Mild for age calcified atherosclerosis at the skull base. No suspicious intracranial vascular hyperdensity. ORBITS: No acute abnormality. SINUSES AND MASTOIDS: Paranasal sinuses, tympanic cavities and mastoids remain clear. SOFT TISSUES AND SKULL: No acute skull fracture. No acute soft tissue abnormality. IMPRESSION: 1. No acute traumatic injury identified. 2. Stable and negative for age non-contrast CT appearance of the brain. Electronically signed by: Helayne Hurst MD 10/26/2024 09:06 AM EST RP Workstation: [id number]   DG Forearm Right Result Date: 10/26/2024 CLINICAL DATA:  Unwitnessed fall EXAM: RIGHT FOREARM - 2 VIEW COMPARISON:  None Available. FINDINGS: There is no evidence of fracture or other focal bone lesions. Soft tissues are unremarkable. IMPRESSION: Negative. Electronically Signed   By: Lynwood Landy Raddle M.D.   On: 10/26/2024 08:41     .Critical Care  Performed by: Rosan Sherlean DEL, PA-C Authorized by: Rosan Sherlean DEL, PA-C   Critical care provider statement:    Critical care time (minutes):  35   Critical care was necessary to treat or prevent imminent or life-threatening deterioration of the following conditions:  Metabolic crisis   Critical care was time spent personally by me on the following activities:  Development of treatment plan with patient or surrogate, discussions with consultants, evaluation of patient's response to treatment, examination of patient, ordering and review of laboratory studies, ordering and review of radiographic studies, ordering and performing treatments and interventions, pulse oximetry, re-evaluation of patient's condition and review of old charts   Care discussed with: admitting provider      Medications Ordered in the ED  sodium chloride  0.9 % bolus 1,000 mL  (1,000 mLs Intravenous New Bag/Given 10/26/24 0951)  sodium chloride  0.9 % bolus 1,000 mL (1,000 mLs Intravenous New Bag/Given 10/26/24 1104)                                     Medical Decision Making Amount and/or Complexity of Data Reviewed Labs: ordered. Radiology: ordered.  Risk Decision regarding hospitalization.   This patient is a 83 y.o. female  who presents to the ED for concern of weakness, fall.   Differential diagnoses prior to evaluation: The emergent differential diagnosis includes, but is not limited to,  CVA, spinal cord injury, ACS, arrhythmia, syncope, orthostatic hypotension, sepsis, hypoglycemia, hypoxia, electrolyte disturbance, endocrine disorder, anemia, environmental exposure, polypharmacy, epidural hematoma, subdural hematoma, skull fracture, subarachnoid hemorrhage, unstable cervical spine fracture, concussion vs other MSK injury, hip fracture, other musculoskeletal injury. This is not an exhaustive differential.   Past Medical History / Co-morbidities / Social History: GERD, gastroparesis, previous TIA, osteopenia, pulmonary fibrosis  Physical Exam: Physical exam performed. The pertinent findings include: Generalized weakness bilateral lower extremities, no obvious step-offs or deformity throughout.  Lab Tests/Imaging studies: I personally interpreted labs/imaging and the pertinent results include: Critical hyponatremia, sodium 119, CBC notable for mild anemia, hemoglobin 9.3, slightly worsened from baseline, platelets 435.  Plain film radiographs of the right forearm, bilateral hips with no evidence of acute fracture, dislocation.  CT head, C-spine with no evidence of intracranial or cervical spinal abnormality.  I agree with the radiologist interpretation.  Cardiac monitoring: EKG obtained and interpreted by myself and attending physician which shows: Normal sinus rhythm, no acute ST-T changes   Medications: I ordered medication including fluid bolus  for hyponatremia.  I have reviewed the patients home medicines and have made adjustments as needed.   Consults: I spoke with the intensivist, Rahul desai, pa-c, who recommends 2L fluids, and recheck BMP  Recheck BMP shows sodium 132, given how critically low it was, as well as patient's generalized weakness we discussed observation admission versus discharge, patient and family would prefer for her to go back to her facility, I fine with this plan as long as her sodium is rechecked closely.  They understand and agree to this plan  Disposition: After consideration of the diagnostic results and the patients response to treatment, I feel that patient discharged in stable condition with improvement of hyponatremia after 2 L in  the ED.[name]   emergency department workup does not suggest an emergent condition requiring admission or immediate intervention beyond what has been performed at this time. The plan is: as above. The patient is safe for discharge and has been instructed to return immediately for worsening symptoms, change in symptoms or any other concerns.   Final diagnoses:  Hyponatremia  Fall, initial encounter  Skin tear of right upper arm without complication, initial encounter    ED Discharge Orders     None          Rosan Sherlean VEAR DEVONNA 10/26/24 1455    Tegeler, Lonni PARAS, MD 10/26/24 1531

## 2024-10-26 NOTE — Progress Notes (Signed)
 Jol[facility] Pack ED [facility]  This patient is a current hospice patient with [facility], admitted on 3.23.25 with a terminal diagnosis of unspecified protein calorie malnutrition. Please see media tab for detailed hospice report.   Patient is a DNR and daughter Archer Vise [phone] is the patient's primary CG.  We will continue to follow for any discharge planning needs and to coordinate continuation of hospice care.  Please don't hesitate to call with any Hospice related questions or concerns.  Thank you for the opportunity to participate in this patient's care. Eleanor Nail, LPN [facility] Liaison [phone]

## 2024-10-26 NOTE — ED Notes (Signed)
 Pt back from  ct and x ray, pt states that she doesn't need anything for pain at this time.

## 2024-10-26 NOTE — ED Triage Notes (Signed)
 Pt to er via ems, pt is from [place], states that she was found down at the facility, states that she was confused upon arrival, but now that she is warmer she is oriented but slow to respond, pt reports pain in her R hip, pt has c collar in place. Pt awake and oriented, pt c/o L side/ leg pain, pt has palpable pedal pulses, pt states that she was weak getting off the toilet and fell.

## 2025-01-01 ENCOUNTER — Ambulatory Visit: Admitting: Podiatry

## 2025-01-10 ENCOUNTER — Ambulatory Visit: Admitting: Nurse Practitioner
